# Patient Record
Sex: Male | Born: 1950 | Race: White | Hispanic: No | Marital: Married | Smoking: Never smoker
Health system: Southern US, Community
[De-identification: ages and names within clinical notes are randomized; demographics above are authoritative.]

## PROBLEM LIST (undated history)

## (undated) DIAGNOSIS — T8859XA Other complications of anesthesia, initial encounter: Secondary | ICD-10-CM

## (undated) DIAGNOSIS — F419 Anxiety disorder, unspecified: Secondary | ICD-10-CM

## (undated) DIAGNOSIS — M797 Fibromyalgia: Secondary | ICD-10-CM

## (undated) DIAGNOSIS — Z931 Gastrostomy status: Secondary | ICD-10-CM

## (undated) DIAGNOSIS — F32A Depression, unspecified: Secondary | ICD-10-CM

## (undated) DIAGNOSIS — J189 Pneumonia, unspecified organism: Secondary | ICD-10-CM

## (undated) DIAGNOSIS — T7840XA Allergy, unspecified, initial encounter: Secondary | ICD-10-CM

## (undated) DIAGNOSIS — G629 Polyneuropathy, unspecified: Secondary | ICD-10-CM

## (undated) DIAGNOSIS — M1990 Unspecified osteoarthritis, unspecified site: Secondary | ICD-10-CM

## (undated) DIAGNOSIS — C099 Malignant neoplasm of tonsil, unspecified: Secondary | ICD-10-CM

## (undated) DIAGNOSIS — K5900 Constipation, unspecified: Secondary | ICD-10-CM

## (undated) DIAGNOSIS — C3490 Malignant neoplasm of unspecified part of unspecified bronchus or lung: Secondary | ICD-10-CM

## (undated) DIAGNOSIS — Z923 Personal history of irradiation: Secondary | ICD-10-CM

## (undated) DIAGNOSIS — T4145XA Adverse effect of unspecified anesthetic, initial encounter: Secondary | ICD-10-CM

## (undated) DIAGNOSIS — F329 Major depressive disorder, single episode, unspecified: Secondary | ICD-10-CM

## (undated) DIAGNOSIS — E119 Type 2 diabetes mellitus without complications: Secondary | ICD-10-CM

## (undated) DIAGNOSIS — R079 Chest pain, unspecified: Secondary | ICD-10-CM

## (undated) DIAGNOSIS — N35919 Unspecified urethral stricture, male, unspecified site: Secondary | ICD-10-CM

## (undated) HISTORY — PX: CYSTOSCOPY: SUR368

---

## 1998-01-13 HISTORY — PX: TOTAL HIP ARTHROPLASTY: SHX124

## 1998-01-23 ENCOUNTER — Emergency Department (HOSPITAL_COMMUNITY): Admission: EM | Admit: 1998-01-23 | Discharge: 1998-01-23 | Payer: Self-pay | Admitting: Emergency Medicine

## 1998-09-18 ENCOUNTER — Encounter: Payer: Self-pay | Admitting: Orthopedic Surgery

## 1998-09-24 ENCOUNTER — Encounter: Payer: Self-pay | Admitting: Orthopedic Surgery

## 1998-09-24 ENCOUNTER — Inpatient Hospital Stay (HOSPITAL_COMMUNITY): Admission: RE | Admit: 1998-09-24 | Discharge: 1998-09-28 | Payer: Self-pay | Admitting: Orthopedic Surgery

## 1998-12-04 ENCOUNTER — Inpatient Hospital Stay (HOSPITAL_COMMUNITY): Admission: EM | Admit: 1998-12-04 | Discharge: 1998-12-05 | Payer: Self-pay | Admitting: Cardiology

## 1999-01-16 ENCOUNTER — Inpatient Hospital Stay (HOSPITAL_COMMUNITY): Admission: RE | Admit: 1999-01-16 | Discharge: 1999-01-18 | Payer: Self-pay | Admitting: Orthopedic Surgery

## 2005-04-22 ENCOUNTER — Other Ambulatory Visit: Admission: RE | Admit: 2005-04-22 | Discharge: 2005-04-22 | Payer: Self-pay | Admitting: Diagnostic Radiology

## 2005-04-22 ENCOUNTER — Encounter: Admission: RE | Admit: 2005-04-22 | Discharge: 2005-04-22 | Payer: Self-pay | Admitting: Endocrinology

## 2005-04-22 ENCOUNTER — Encounter (INDEPENDENT_AMBULATORY_CARE_PROVIDER_SITE_OTHER): Payer: Self-pay | Admitting: Specialist

## 2006-12-22 ENCOUNTER — Ambulatory Visit (HOSPITAL_COMMUNITY): Admission: RE | Admit: 2006-12-22 | Discharge: 2006-12-22 | Payer: Self-pay | Admitting: Family Medicine

## 2006-12-31 ENCOUNTER — Encounter (INDEPENDENT_AMBULATORY_CARE_PROVIDER_SITE_OTHER): Payer: Self-pay | Admitting: Urology

## 2006-12-31 ENCOUNTER — Ambulatory Visit (HOSPITAL_COMMUNITY): Admission: RE | Admit: 2006-12-31 | Discharge: 2006-12-31 | Payer: Self-pay | Admitting: Urology

## 2009-01-30 ENCOUNTER — Emergency Department (HOSPITAL_COMMUNITY): Admission: EM | Admit: 2009-01-30 | Discharge: 2009-01-30 | Payer: Self-pay | Admitting: Emergency Medicine

## 2010-01-13 DIAGNOSIS — J189 Pneumonia, unspecified organism: Secondary | ICD-10-CM

## 2010-01-13 HISTORY — DX: Pneumonia, unspecified organism: J18.9

## 2010-04-29 ENCOUNTER — Emergency Department (HOSPITAL_COMMUNITY): Payer: 59

## 2010-04-29 ENCOUNTER — Inpatient Hospital Stay (HOSPITAL_COMMUNITY)
Admission: EM | Admit: 2010-04-29 | Discharge: 2010-05-04 | DRG: 871 | Disposition: A | Discharge status: Home Health | Payer: 59 | Attending: Otolaryngology | Admitting: Otolaryngology

## 2010-04-29 ENCOUNTER — Inpatient Hospital Stay (HOSPITAL_COMMUNITY): Payer: 59

## 2010-04-29 DIAGNOSIS — J189 Pneumonia, unspecified organism: Secondary | ICD-10-CM | POA: Diagnosis present

## 2010-04-29 DIAGNOSIS — A419 Sepsis, unspecified organism: Secondary | ICD-10-CM | POA: Diagnosis present

## 2010-04-29 DIAGNOSIS — F05 Delirium due to known physiological condition: Secondary | ICD-10-CM | POA: Diagnosis present

## 2010-04-29 DIAGNOSIS — E1142 Type 2 diabetes mellitus with diabetic polyneuropathy: Secondary | ICD-10-CM | POA: Diagnosis present

## 2010-04-29 DIAGNOSIS — A4102 Sepsis due to Methicillin resistant Staphylococcus aureus: Principal | ICD-10-CM | POA: Diagnosis present

## 2010-04-29 DIAGNOSIS — G8929 Other chronic pain: Secondary | ICD-10-CM | POA: Diagnosis present

## 2010-04-29 DIAGNOSIS — R911 Solitary pulmonary nodule: Secondary | ICD-10-CM | POA: Diagnosis present

## 2010-04-29 DIAGNOSIS — N200 Calculus of kidney: Secondary | ICD-10-CM | POA: Diagnosis present

## 2010-04-29 DIAGNOSIS — E1149 Type 2 diabetes mellitus with other diabetic neurological complication: Secondary | ICD-10-CM | POA: Diagnosis present

## 2010-04-29 DIAGNOSIS — IMO0001 Reserved for inherently not codable concepts without codable children: Secondary | ICD-10-CM | POA: Diagnosis present

## 2010-04-29 LAB — CK TOTAL AND CKMB (NOT AT ARMC)
Relative Index: INVALID (ref 0.0–2.5)
Total CK: 63 U/L (ref 7–232)

## 2010-04-29 LAB — CARDIAC PANEL(CRET KIN+CKTOT+MB+TROPI)
CK, MB: 2.2 ng/mL (ref 0.3–4.0)
Relative Index: INVALID (ref 0.0–2.5)
Total CK: 65 U/L (ref 7–232)
Troponin I: 0.03 ng/mL (ref 0.00–0.06)

## 2010-04-29 LAB — GLUCOSE, CAPILLARY
Glucose-Capillary: 217 mg/dL — ABNORMAL HIGH (ref 70–99)
Glucose-Capillary: 215 mg/dL — ABNORMAL HIGH (ref 70–99)

## 2010-04-29 LAB — BASIC METABOLIC PANEL
BUN: 15 mg/dL (ref 6–23)
Chloride: 99 mEq/L (ref 96–112)
Creatinine, Ser: 0.81 mg/dL (ref 0.4–1.5)
GFR calc non Af Amer: >60 mL/min (ref >60)
Glucose, Bld: 241 mg/dL — ABNORMAL HIGH (ref 70–99)
Sodium: 132 mEq/L — ABNORMAL LOW (ref 135–145)

## 2010-04-29 LAB — DIFFERENTIAL
Basophils Absolute: 0 10*3/uL (ref 0.0–0.1)
Basophils Relative: 0 % (ref 0–1)
Eosinophils Absolute: 0.1 10*3/uL (ref 0.0–0.7)
Eosinophils Relative: 1 % (ref 0–5)
Lymphocytes Relative: 12 % (ref 12–46)
Lymphs Abs: 1.7 10*3/uL (ref 0.7–4.0)
Monocytes Relative: 10 % (ref 3–12)
Neutro Abs: 10.9 10*3/uL — ABNORMAL HIGH (ref 1.7–7.7)
Neutrophils Relative %: 77 % (ref 43–77)

## 2010-04-29 LAB — URINALYSIS, ROUTINE W REFLEX MICROSCOPIC

## 2010-04-29 LAB — CBC
HCT: 41.8 % (ref 39.0–52.0)
MCH: 30.7 pg (ref 26.0–34.0)
MCV: 92.3 fL (ref 78.0–100.0)
WBC: 14.1 10*3/uL — ABNORMAL HIGH (ref 4.0–10.5)

## 2010-04-29 LAB — LIPID PANEL
Cholesterol: 121 mg/dL (ref 0–200)
HDL: 13 mg/dL — ABNORMAL LOW (ref >39)
LDL Cholesterol: 86 mg/dL (ref 0–99)
Total CHOL/HDL Ratio: 9.3 RATIO
Triglycerides: 110 mg/dL (ref <150)

## 2010-04-29 LAB — HEMOGLOBIN A1C: Hgb A1c MFr Bld: 8.1 % — ABNORMAL HIGH (ref <5.7)

## 2010-04-29 LAB — MRSA PCR SCREENING: MRSA by PCR: POSITIVE — AB

## 2010-04-30 ENCOUNTER — Inpatient Hospital Stay (HOSPITAL_COMMUNITY): Payer: 59

## 2010-04-30 LAB — GLUCOSE, CAPILLARY
Glucose-Capillary: 155 mg/dL — ABNORMAL HIGH (ref 70–99)
Glucose-Capillary: 110 mg/dL — ABNORMAL HIGH (ref 70–99)

## 2010-04-30 LAB — COMPREHENSIVE METABOLIC PANEL
ALT: 30 U/L (ref 0–53)
Alkaline Phosphatase: 75 U/L (ref 39–117)
BUN: 9 mg/dL (ref 6–23)
CO2: 26 mEq/L (ref 19–32)
Chloride: 101 mEq/L (ref 96–112)
GFR calc non Af Amer: >60 mL/min (ref >60)
Glucose, Bld: 147 mg/dL — ABNORMAL HIGH (ref 70–99)
Potassium: 3.8 mEq/L (ref 3.5–5.1)
Sodium: 136 mEq/L (ref 135–145)
Total Bilirubin: 0.9 mg/dL (ref 0.3–1.2)
Total Protein: 7.1 g/dL (ref 6.0–8.3)

## 2010-04-30 LAB — CBC
HCT: 39.2 % (ref 39.0–52.0)
Hemoglobin: 12.8 g/dL — ABNORMAL LOW (ref 13.0–17.0)
MCH: 30.6 pg (ref 26.0–34.0)
MCV: 93.8 fL (ref 78.0–100.0)
Platelets: 195 10*3/uL (ref 150–400)
RBC: 4.18 MIL/uL — ABNORMAL LOW (ref 4.22–5.81)
WBC: 11.9 10*3/uL — ABNORMAL HIGH (ref 4.0–10.5)

## 2010-04-30 LAB — T4, FREE: Free T4: 1.05 ng/dL (ref 0.80–1.80)

## 2010-04-30 LAB — CARDIAC PANEL(CRET KIN+CKTOT+MB+TROPI)
CK, MB: 1.4 ng/mL (ref 0.3–4.0)
Troponin I: 0.02 ng/mL (ref 0.00–0.06)

## 2010-04-30 LAB — DIFFERENTIAL
Basophils Relative: 0 % (ref 0–1)
Eosinophils Absolute: 0.1 10*3/uL (ref 0.0–0.7)
Lymphs Abs: 1.4 10*3/uL (ref 0.7–4.0)
Monocytes Absolute: 1.1 10*3/uL — ABNORMAL HIGH (ref 0.1–1.0)
Neutrophils Relative %: 77 % (ref 43–77)

## 2010-05-01 ENCOUNTER — Inpatient Hospital Stay (HOSPITAL_COMMUNITY): Payer: 59

## 2010-05-01 LAB — BASIC METABOLIC PANEL
BUN: 11 mg/dL (ref 6–23)
Calcium: 8.2 mg/dL — ABNORMAL LOW (ref 8.4–10.5)
Chloride: 101 mEq/L (ref 96–112)
GFR calc Af Amer: >60 mL/min (ref >60)
GFR calc non Af Amer: >60 mL/min (ref >60)
Glucose, Bld: 133 mg/dL — ABNORMAL HIGH (ref 70–99)
Potassium: 3.9 mEq/L (ref 3.5–5.1)
Sodium: 134 mEq/L — ABNORMAL LOW (ref 135–145)

## 2010-05-01 LAB — BLOOD GAS, ARTERIAL
O2 Content: 3.5 L/min
O2 Saturation: 94.2 %
Patient temperature: 37
pH, Arterial: 7.436 (ref 7.350–7.450)
pO2, Arterial: 65.5 mmHg — ABNORMAL LOW (ref 80.0–100.0)

## 2010-05-01 LAB — INFLUENZA PANEL BY PCR (TYPE A & B): H1N1 flu by pcr: NOT DETECTED

## 2010-05-01 LAB — URINE CULTURE
Colony Count: NO GROWTH
Culture: NO GROWTH

## 2010-05-01 LAB — GLUCOSE, CAPILLARY
Glucose-Capillary: 116 mg/dL — ABNORMAL HIGH (ref 70–99)
Glucose-Capillary: 116 mg/dL — ABNORMAL HIGH (ref 70–99)

## 2010-05-02 LAB — GLUCOSE, CAPILLARY
Glucose-Capillary: 129 mg/dL — ABNORMAL HIGH (ref 70–99)
Glucose-Capillary: 119 mg/dL — ABNORMAL HIGH (ref 70–99)
Glucose-Capillary: 85 mg/dL (ref 70–99)

## 2010-05-02 LAB — BASIC METABOLIC PANEL
CO2: 27 mEq/L (ref 19–32)
Calcium: 8.1 mg/dL — ABNORMAL LOW (ref 8.4–10.5)
Creatinine, Ser: 0.64 mg/dL (ref 0.4–1.5)
GFR calc Af Amer: >60 mL/min (ref >60)
GFR calc non Af Amer: >60 mL/min (ref >60)

## 2010-05-02 LAB — CULTURE, BLOOD (ROUTINE X 2)

## 2010-05-02 LAB — VANCOMYCIN, TROUGH: Vancomycin Tr: 13 ug/mL (ref 10.0–20.0)

## 2010-05-03 LAB — GLUCOSE, CAPILLARY
Glucose-Capillary: 104 mg/dL — ABNORMAL HIGH (ref 70–99)
Glucose-Capillary: 148 mg/dL — ABNORMAL HIGH (ref 70–99)
Glucose-Capillary: 99 mg/dL (ref 70–99)
Glucose-Capillary: 103 mg/dL — ABNORMAL HIGH (ref 70–99)

## 2010-05-03 LAB — BASIC METABOLIC PANEL
Calcium: 8.5 mg/dL (ref 8.4–10.5)
GFR calc Af Amer: >60 mL/min (ref >60)
GFR calc non Af Amer: >60 mL/min (ref >60)
Glucose, Bld: 92 mg/dL (ref 70–99)
Potassium: 3.6 mEq/L (ref 3.5–5.1)
Sodium: 138 mEq/L (ref 135–145)

## 2010-05-08 NOTE — Discharge Summary (Signed)
NAME:  [name], [name] NO.:  [id number]  MEDICAL RECORD NO.:  [id number]           PATIENT TYPE:  I  LOCATION:  A326                          FACILITY:  [facility]  PHYSICIAN:  Aleksei V. Plotnikov, MDDATE OF BIRTH:  June 28, 1950  DATE OF ADMISSION:  04/29/2010 DATE OF DISCHARGE:  04/21/2012LH                              DISCHARGE SUMMARY   FOLLOWUP PLANS: 1. With Dr. Loreta Ave next week with CMET and CBC. 2. Dr. Juanetta Gosling, Pulmonary, in 4-6 weeks.  SPECIAL INSTRUCTIONS:  Check vancomycin level by [facility] home [facility] weekly.  Recheck chest x-ray PA and lateral in 4-6 weeks.  ACTIVITY:  Increase activities slowly as tolerated.  DIET:  Resume previous.  DISCHARGE DIAGNOSES: 1. Pneumonia with methicillin-resistant Staphylococcus aureus     bacteremia. 2. Type 2 diabetes. 3. Confusion, resolved. 4. Pulmonary nodule in the left lung status post Pulmonary     consultation with Dr. Juanetta Gosling, recommended a repeat chest x-ray and     biopsy if not resolved. 5. Probable aspiration episode. 6. Hypertension. 7. Possible heavy alcohol use. 8. Bronchospasm, resolved. 9. Back pain, fibromyalgia.  DISCHARGE MEDICATIONS: 1. Tylenol 325 mg two q.6 h. p.r.n. 2. Aspirin 81 mg daily. 3. Tussionex syrup 5 mL b.i.d. p.r.n. 4. Lisinopril 5 mg daily. 5. Metformin 500 mg daily. 6. Avelox 400 mg daily for 11 days. 7. Bactroban ointment nasally twice a day for 11 days. 8. Nystatin suspension 100,000 units/mL, 5 mL by mouth three times a     day for 11 days. 9. Thiamine 100 mg daily. 10.Vancomycin 2 g IV q.12 h. for 11 days. 11.Alprazolam 2 mg three times a day p.r.n. as prior. 12.Cymbalta 60 mg daily, resume previous. 13.Lortab 10/325 one to two tablets every 6 hours p.r.n., resume     previous. 14.Lyrica 75 mg one capsule three times a day p.r.n., resume previous.  OUTPATIENT ARRANGEMENTS:  IV antibiotics will be administered by advanced home care.  HISTORY:  The patient is a  60 year old male who was admitted with fever, tachycardia, low blood pressure, severe back pain, and cough.  He had been feeling bad for about 2-3 weeks prior to his admission.  He was having cough with left-sided chest pain.  He was admitted, started on IV antibiotics, and breathing treatments.  His white cell count was 14.1, hemoglobin 13.9.  CT of the abdomen and pelvis showed multifocal airspace disease, nodular density in the lingula, small stones in the kidney and originally, he was started on Rocephin and Zithromax which later were switched to vancomycin and Avelox.  His condition has gradually improved.  His confusion that was attributed to his illness and prior alcohol use has resolved.  On the day of discharge, he is feeling well.  His temperature is 98.2, T-max 98.3, heart rate 103, respirations 16, blood pressure 131/82, sats 91% on room air, blood sugars 103, 148, 104.  He is in no acute distress.  He is overweight. HEENT with moist mucosa.  Neck supple.  Lungs with decreased breath sounds at bases and he has slight wheezes at the right base.  Heart, S1  and S2 slight tachycardia.  Abdomen soft, nontender, obese.  Lower extremities without edema.  Calves nontender.  He is alert, oriented, and cooperative. Cranial nerves are nonfocal.    His chest x-ray on April18, 2012, revealed bilateral pleural effusions  and basilar atelectasis infiltration without significant change.  Again, his CAT scan of the abdomen on April 29, 2010, revealed a nodular density in the lingula, likely also reflecting infection.  Recommended followup CAT scan after resolution of the symptoms to assure resolution of this lesion as well. However, Dr. Juanetta Gosling recommended a repeat chest x-ray.  His bladder was moderately distended.  He had spondylosis of the lower lumbar spine, diffuse fatty infiltration of the liver, minimal stranding of the upper pole of the left kidney which was nonspecific.  His original  chest x-ray revealed low lung volumes with bibasilar atelectasis and small pleural effusions, nonspecific 8 mm diameter left midlung nodular density.  On May 03, 2010, his sodium was 138, potassium 3.6, glucose 92, BUN 7, creatinine 0.78.  His urine culture was negative.  His blood culture revealed MRSA and nasal swab PCR revealed MRSA.     Georgina Quint. Plotnikov, MD     AVP/MEDQ  D:  05/04/2010  T:  05/05/2010  Job:  [id number]  cc:   Sharlet Salina L. Loreta Ave, [place]  Electronically Signed by Jacinta Shoe MD on 05/08/2010 12:12:29 AM

## 2010-05-12 NOTE — Group Therapy Note (Signed)
  NAME:  [name], [name]                    ACCOUNT NO.:  [id number]  MEDICAL RECORD NO.:  [id number]           PATIENT TYPE:  I  LOCATION:  IC02                          FACILITY:  [facility]  PHYSICIAN:  Shahida Schnackenberg L. Juanetta Gosling, M.D.DATE OF BIRTH:  1950/02/22  DATE OF PROCEDURE: DATE OF DISCHARGE:                                PROGRESS NOTE   [name] continues to look better.  He is complaining now that he has had significant cough and congestion and has been unable to get anything up.  Everything else is about the same.  He says he has not slept, but he does feel a little bit better in general.  PHYSICAL EXAMINATION:  VITAL SIGNS:  His blood pressure 125/74, pulse much better now down in the 80s, he is afebrile.     Regan Mcbryar L. Juanetta Gosling, M.D.     ELH/MEDQ  D:  05/02/2010  T:  05/02/2010  Job:  [id number]  Electronically Signed by Kari Baars M.D. on 05/12/2010 02:38:28 PM

## 2010-05-12 NOTE — Group Therapy Note (Signed)
  NAME:  KALEAB, FRASIER NO.:  [id number]  MEDICAL RECORD NO.:  [id number]           PATIENT TYPE:  LOCATION:                                 FACILITY:  PHYSICIAN:  Kealy Lewter L. Juanetta Gosling, M.D.DATE OF BIRTH:  11/16/1950  DATE OF PROCEDURE: DATE OF DISCHARGE:                                PROGRESS NOTE   The patient of the [facility] hospital[facility] team 1.  Mr. Mazzuca had trouble yesterday with being confused.  He appeared to have aspirated on some egg yesterday morning.  This morning, he is less confused.  He is more alert.  His blood pressure earlier was 125/80, last blood pressure was 87/63, but he looks very comfortable, much more alert than before.  His heart rate is about 112.  He is afebrile now, although he had a temperature as high as 37.5 or 99.5 yesterday morning.  His I and O is +850 yesterday -50 so far today.  His blood gas shows a pH of 7.43, pCO2 of 39, pO2 of 65.  His electrolytes are normal.  He did have positive blood cultures for what appears to be staph at least it is gram-positive cocci in clusters.  He is on vancomycin now.  I think he has improved some, although he is still sick.  I think what we are dealing with then is probably a staphylococcal pneumonia.  He is being treated for that, and I will plan to continue meds and treatments and follow.     Risa Auman L. Juanetta Gosling, M.D.     ELH/MEDQ  D:  05/01/2010  T:  05/01/2010  Job:  [id number]  Electronically Signed by Kari Baars M.D. on 05/12/2010 02:38:25 PM

## 2010-05-12 NOTE — Group Therapy Note (Signed)
  NAME:  [name], [name]                    ACCOUNT NO.:  [id number]  MEDICAL RECORD NO.:  [id number]           PATIENT TYPE:  I  LOCATION:  A326                          FACILITY:  [facility]  PHYSICIAN:  Myonna Chisom L. Juanetta Gosling, M.D.DATE OF BIRTH:  18-May-1950  DATE OF PROCEDURE: DATE OF DISCHARGE:                                PROGRESS NOTE   The patient of the [facility], and he was admitted with pneumonia and a positive blood culture for staph.  He seems to be doing better in general.  PHYSICAL EXAMINATION:  VITAL SIGNS:  His exam shows that his temperature is 98.3, pulse 100, respirations 18, blood pressure 148/93, O2 sat 96% on 3-1/2 liters. CHEST:  Shows rhonchi bilaterally.  ASSESSMENT:  He has pneumonia.  This appears to be methicillin-resistant Staphylococcus aureus pneumonia with positive blood culture.  PLAN:  My plan then is to continue with his treatments.  He is on vancomycin, seems to be improving on a daily basis.  His chest is much clearer than it has been.  He looks much better in general.  I do not think there is anything to change at this point.     Vyla Pint L. Juanetta Gosling, M.D.     ELH/MEDQ  D:  05/03/2010  T:  05/03/2010  Job:  [id number]  Electronically Signed by Kari Baars M.D. on 05/12/2010 02:38:31 PM

## 2010-05-12 NOTE — Consult Note (Signed)
NAME:  [name], [name]                    ACCOUNT NO.:  [id number]  MEDICAL RECORD NO.:  [id number]           PATIENT TYPE:  I  LOCATION:  IC02                          FACILITY:  [facility]  PHYSICIAN:  Adelle Zachar L. Juanetta Gosling, M.D.DATE OF BIRTH:  January 28, 1950  DATE OF CONSULTATION: DATE OF DISCHARGE:                                CONSULTATION   REASON FOR CONSULTATION:  Abnormal chest x-ray.  HISTORY:  [name] is a 60 year old who has a history of multiple medical problems including diabetes and neuropathy.  He started having some back pain and nausea about 2-1/2 weeks ago when after he had been started on Cymbalta and Lyrica for diabetic neuropathy.  He continued having some discomfort in his abdomen.  Then he started having some cough and congestion but has not had a definite fever or chills.  He says he still feels bad, still having some abdominal discomfort, still coughing, still short of breath.  PAST MEDICAL HISTORY:  Positive for diabetes, neuropathy in the right hip.  He has had a hip replacement.  He has a urethral stricture, fibromyalgia, and diabetic neuropathy.  SOCIAL HISTORY:  He works in a [employer].  He does not smoke.  He stopped smoking about 20 years ago.  Uses alcohol on rare occasions.  He does not use any illicit drugs.  FAMILY HISTORY:  His mother died of leukemia with liver disease, unknown about his father.  There is a history of hypertension.  PHYSICAL EXAMINATION:  GENERAL:  He looks uncomfortable, still has a heart rate of about 120. HEENT:  His pupils are reactive.  Nose and throat are clear.  Mucous membranes are moist. NECK:  Supple. CHEST:  Some rhonchi bilaterally. ABDOMEN:  Soft with some minimal diffuse tenderness.  Bowel sounds present and active. EXTREMITIES:  No edema.  LAB WORK ON ADMISSION:  White count 14,100, hemoglobin 13.9, platelets 154,000.  BMET shows a BUN of 15, creatinine 0.8.  He has had blood cultures done that are negative at  less than 24 hours.  HIV nonreactive. Cardiac panel thus far not suggestive of any sort of myocardial infarction.  CBC this morning shows white count is down to 11,900.  BNP is less than 30, metabolic profile essentially normal.  Albumin is 2.7. He had a CT abdomen and pelvis and a chest x-ray which show a nodular density in the left lingular area that is probably of infection also what appears to be pneumonia.  ASSESSMENT:  I would go ahead and treat him as pneumonia.  We will need to follow his chest x-ray.  If the pulmonary nodule does not resolve, he is going to need to have some sort of a biopsy.  This area is against the chest wall, so probably a needle biopsy would be the best way to go about this and I will plan to go ahead and have him to follow with you. I agree with current treatments.  I would treat this as you are as a community-acquired pneumonia.     [name] L. Juanetta Gosling, M.D.     ELH/MEDQ  D:  04/30/2010  T:  04/30/2010  Job:  [id number]  Electronically Signed by Kari Baars M.D. on 05/12/2010 02:38:21 PM

## 2010-05-12 NOTE — Group Therapy Note (Signed)
  NAME:  WITT, PLITT                    ACCOUNT NO.:  [id number]  MEDICAL RECORD NO.:  [id number]           PATIENT TYPE:  I  LOCATION:  A326                          FACILITY:  [facility]  PHYSICIAN:  Nyshawn Gowdy L. Juanetta Gosling, M.D.DATE OF BIRTH:  10-27-1950  DATE OF PROCEDURE: DATE OF DISCHARGE:  05/04/2010                                PROGRESS NOTE   The patient of the [facility].  Mr. Hulbert is much improved. He says he feels well and has no complaints.  His exam shows his temperature is 98.2, pulse 103, respirations 16, blood pressure 131/82, O2 sats 91% on room air, 93% on 2 liters.  His chest is much clearer. His heart is regular without gallop.  His abdomen is soft.  ASSESSMENT:  He is much improved.  PLAN:  I am going to follow a bit more peripherally now.  He has pneumonia and has had a MRSA in the blood.  I think it is an MRSA pneumonia.  This will need to be followed until it is clear.  Thanks for allowing me to see him with you.     Dann Galicia L. Juanetta Gosling, M.D.     ELH/MEDQ  D:  05/04/2010  T:  05/04/2010  Job:  [id number]  Electronically Signed by Kari Baars M.D. on 05/12/2010 02:38:37 PM

## 2010-05-20 NOTE — Group Therapy Note (Signed)
NAME:  [name], [name]                    ACCOUNT NO.:  [id number]  MEDICAL RECORD NO.:  [id number]           PATIENT TYPE:  I  LOCATION:  A326                          FACILITY:  [facility]  PHYSICIAN:  Mirca Yale L. Lendell Caprice, MDDATE OF BIRTH:  20-Jul-1950  DATE OF PROCEDURE:  05/03/2010 DATE OF DISCHARGE:                                PROGRESS NOTE   SUBJECTIVE:  The patient's cough is much improved after Tussionex and a flutter valve was started yesterday.  He is still feeling quite weak and has not ambulated yet.  He has no other new complaints.  OBJECTIVE:  VITAL SIGNS:  Temperature is 98.3, heart rate 70, respiratory rate 18, blood pressure 148/93, oxygen saturation 96-99% on 3-1/2 L  nasal cannula oxygen. GENERAL:  The patient is comfortable reading the newspaper with oxygen on.  He is alert and oriented. LUNGS:  Clear to auscultation bilaterally without wheezes, rhonchi or rales.  He is not coughing as much. CARDIOVASCULAR:  Regular rate and rhythm without murmurs, gallops or rubs. ABDOMEN:  Obese, soft, nontender. EXTREMITIES:  No clubbing, cyanosis or edema.  Blood glucoses have been ranging about 100 over the past 24 hours. Basic metabolic panel is normal.  ASSESSMENT AND PLAN: 1. Methicillin-resistant Staphylococcus aureus pneumonia with     bacteremia:  I have discussed the case with Dr. Daiva Eves of     Infectious Disease.  He recommends a total course of IV vancomycin     for a month and then repeat blood cultures 2 weeks after completion     to ensure resolution of bacteremia.  Clinically, the patient is     much improved.  I will ask nurses to check his ambulatory pulse     oximetry and stop oxygen if his oxygenation is sufficient.  His     lungs are much more clear.  His cough is much improved.  I have     ordered a home health nurse to arrange IV vancomycin until Jun 01, 2010, and also to assist with PICC care.  The PICC line can come     out after that. 2. Resolved  sepsis. 3. Abnormal lingular nodular density seen on CT of the abdomen and     pelvis on admission:  This will need to be followed up with a     repeat CAT scan after resolution of his symptoms in 2-3 months to     ensure resolution.  This could simply be infectious etiology.  Dr.     Juanetta Gosling was consulted and agrees.  No further workup needed at this     time. 4. Previously diet-controlled diabetes, not adequately controlled     based on hospitalization, blood glucoses and hemoglobin A1c of     above 8:  He is doing well on metformin 500 mg a day and Amaryl 1     mg a day. 5. Chronic pain and neuropathy, stable. 6. Back pain on admission, resolved quickly, possibly a past kidney     stone. 7. Resolved acute delirium secondary to above. 8. Chronic opiate  and benzodiazepine use. 9. Bronchospasm, resolved. 10.Status post isolated aspiration event:  No problems per speech     therapy's evaluation.  He has been fine on a regular diet.  We will increase his activity level. 1. Elevated blood pressure:  He does not have a diagnosis of     hypertension that I can see.  Due to his diabetes and persistently     elevated blood pressures over the past several days, I will start     him on lisinopril 5 mg daily as he most likely has hypertension.     Madex Seals L. Lendell Caprice, MD     CLS/MEDQ  D:  05/03/2010  T:  05/03/2010  Job:  [id number]  cc:   Kirk Ruths, M.D. Fax: [phone]  Electronically Signed by Crista Curb MD on 05/20/2010 08:06:48 AM

## 2010-05-28 NOTE — Op Note (Signed)
NAME:  [name], [name] NO.:  [id number]   MEDICAL RECORD NO.:  [id number]          PATIENT TYPE:  AMB   LOCATION:  DAY                          FACILITY:  [facility]   PHYSICIAN:  Mark C. Vernie Ammons, M.D.  DATE OF BIRTH:  06-Aug-1950   DATE OF PROCEDURE:  DATE OF DISCHARGE:                               OPERATIVE REPORT   PREOPERATIVE DIAGNOSIS:  Urethral stricture.   POSTOPERATIVE DIAGNOSES:  1. Urethral stricture.  2. Bladder lesion.   PROCEDURE:  1. Cystoscopy.  2. Laser incision of urethral stricture.  3. Balloon dilation of urethral stricture.  4. Bladder biopsy.  5. Fulguration of bleeders.   SURGEON:  Mark C. Vernie Ammons, M.D.   Threasa HeadsAllena Katz   ANESTHESIA:  General.   SPECIMEN:  Cold cup biopsy of bladder wall to pathology.   DRAINS:  18-French Foley catheter.   ESTIMATED BLOOD LOSS:  Minimal.   COMPLICATIONS:  None.   INDICATIONS:  The patient is a 60 year old white male who has had a  history of what sounds like a stricture 20 years ago.  He went into  retention and went to emergency room.  They attempted to place the  catheter, were unsuccessful and had to have a suprapubic tube placed.  The patient describes what sounds like a urethral dilation and eventual  placement of urethral catheter.  He was found recently by CT scan, after  complaining of abdominal discomfort to have a markedly distended bladder  but no hydronephrosis and a normal creatinine of 0.7.  He was found  cystoscopically to have a deep bulbar urethral stricture and is brought  to the operating room for treatment of that.  The risks, complications,  and alternatives were discussed.  The patient understands, elected  to  proceed.   DESCRIPTION OF OPERATION:  After informed consent, the patient was  brought to the major OR and placed on the table, administered general  anesthesia and moved to the dorsal lithotomy position.  He received  intravenous antibiotics and an official  time out was then performed.  Initially, a 22-French cystoscope with globular lens was inserted  through the urethra and passed under direct visualization down the  urethra.  There was a mild stricture that did not impede  Passage of the 17-French scope in my office but caused some hold up with  the 22-French scope.  This was located in the proximal bulbar urethral  region.  The scope was gently passed through this using gentle pressure  and on down in the deep bulbar urethra.  A dense stricture was  identified.  This was photographed and a 0.03 floppy-tip guide wire was  then passed through the cystoscope and into the bladder with the use of  fluoroscopy to confirm the presence of the guide wire in the bladder.   Initially, the 1 mm holmium laser fiber was then passed through the  cystoscope, and an incision was made at the 12 o'clock position through  the stricture.  In doing this, it appeared that the prostatic urethra  was very close by.  I therefore stopped any further incising with the  laser at that time.   I was able to gently pass the scope through this area and found the  prostatic urethra nearby.  There was mild bilobar hypertrophy but no  prostatic urethral lesions were identified.  This appeared to indicate  the strictures location was at the level of the membranous urethra, and  therefore, further incision was not prudent.  The bladder was found to  have a significant amount of residual.  This was drained and then  refilled slowly, and with direct visualization, the entire wall of the  bladder was fully inspected.  There was some hypertrophy of the  detrusors seen beneath the mucosa.  On the left wall posteriorly, there  did appear to be an area that seemed more erythematous then the  surrounding mucosa.  The bladder was then filled to capacity under  anesthesia and then drained.  Once it was found to hold 1,000 cc.   Nephrostomy dilating balloon was then passed over the  guide wire after  the cystoscope was removed.  Its position was confirmed  fluoroscopically, and it was inflated to dilate the urethral stricture.  This was then released and removed, and the cystoscope again passed per  urethra, and the stricture appeared well dilated at this time.   The cold cup biopsy forceps were then passed through the cystoscope, and  cold cup biopsies were obtained from the posterior wall of the bladder  in the area of the erythematous lesion.  This was then fulgurated with  the Bugbee electrode.  The bladder was then drained, an 18-French Foley  catheter was then placed in the bladder, and the patient was awakened  and taken to the recovery room in stable and satisfactory condition.  He  tolerated the procedure well.  There are no intraoperative  complications.   PLAN:  He will maintain the Foley catheter in place for one week and  follow up in my office for removal at that time and also to discuss the  pathology report.  He will be maintained on Cipro 500 mg for that  duration, be given a prescription for Vicodin.      Mark C. Vernie Ammons, M.D.  Electronically Signed     MCO/MEDQ  D:  12/31/2006  T:  12/31/2006  Job:  [id number]

## 2010-05-31 NOTE — Op Note (Signed)
[place]  Patient:    [name]                            MRN: [id number] Proc. Date: 01/16/99 Adm. 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  PROCEDURE:  Exploration and decompression of sciatic nerve, posterior right hip, with neurolysis and epineurotomy.  SURGEON:  Loreta Ave, M.D.  ASSISTANT:  Arlys John D. Petrarca, P.A.-C.  ANESTHESIA:  General.  BLOOD LOSS:  About 200 cc.  BLOOD GIVEN:  None.  SPECIMENS:  None.  CULTURES:  None.  COMPLICATIONS:  None.  DRESSING:  Soft compressive.  DESCRIPTION OF PROCEDURE:  Patient was brought to the operating room and after adequate anesthesia had been obtained, turned to a lateral position and prepped and draped in the usual sterile fashion.  Appropriate padding and support. Previous total hip incision was utilized and opened over its length; this was a posterior approach.  Skin and subcutaneous tissue and iliotibial band divided and Charnley retractor put in place.  Careful dissection was made over the posterior aspect f the hip, leaving the capsule and external rotators in intact, not entering the ip joint itself.  Sciatic nerve was identified and completely decompressed over its entire course, from the sciatic notch proximally down past the gluteal attachment to the femur distally.  The site of compression on EMG and nerve conduction studies was right over the external rotators.  There were numerous fibrous bands in this area.  I completely released the piriformis over the sciatic nerve proximally and followed the nerve up to  the level of the gluteal branches where there was no compression by EMG and nerve conduction studies from that level above.  It was hen completely decompressed distally all the way down, well past the initial operative field, going distal to the gluteal attachment to the femur.  Very thickened epineurium was opened over the entire course.  There was no evidence of vascular lesion or stretched lesion within the nerve itself and it really appeared quite  normal in appearance, once completely decompressed.  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  There was no undue tension through full motion.  With the hip at a position at rest, there was no tension on the nerve at all.  I could only get some tension if the hip was flexed forward and the knee fully extended, but again, this was certainly not unacceptable.  The nerve was left completely decompressed; the wound thoroughly  irrigated.  Iliotibial band was closed with #1 Vicryl; skin and subcutaneous tissue with Vicryl and staples.  Margins of the wound were injected with Marcaine, sterile compressive dressing applied, returned to the supine position, anesthesia reversed and brought to the recovery room; tolerated surgery well and no complications. DD:  01/17/99 TD:  01/17/99 Job: [id number]

## 2010-06-03 NOTE — H&P (Signed)
NAME:  [name], [name] NO.:  [id number]  MEDICAL RECORD NO.:  [id number]           PATIENT TYPE:  E  LOCATION:  [facility]                          FACILITY:  [facility]  PHYSICIAN:  Elliot Cousin, M.D.    DATE OF BIRTH:  04-01-1950  DATE OF ADMISSION:  04/29/2010 DATE OF DISCHARGE:                             HISTORY & PHYSICAL   PRIMARY CARE PHYSICIAN:  Dr. Regino Schultze; his PA is Mr. Dwyane Luo.  CHIEF COMPLAINT:  Severe back pain with cough.  HISTORY OF PRESENT ILLNESS:  This is a 60 year old gentleman with a history of diet-controlled diabetes and compressive neuropathy of the right hip who presents to the ED with fever, tachycardia, hypotension and severe back pain with cough.  The history is primarily given by his wife as the patient is feeling poorly.  The wife tells me that he began feeling poorly with back pain and nausea approximately two-and-one-half weeks ago when he started Cymbalta and Lyrica.  One week ago he developed a cough and fever but attributed it to allergies.  Then, on Saturday night he developed severe left back pain with cough and had difficulty sleeping.  However, on Sunday night he had no sleep and was screaming in pain, coughing while he was holding the left side of his back.  He was brought to the ED doubled over in pain.  Currently, he is feeling better.  The wife tells me that she stopped his Cymbalta a few days ago as she believed it was contributing to his feeling poorly.  His Lyrica was continued and increased.  The patient denies any chest pain, changes in his bowel habits or hemoptysis.  PAST MEDICAL HISTORY:  Significant for: 1. Diet-controlled diabetes. 2. Compressive neuropathy of the right hip; he is status post right     hip replacement. 3. History of urethral stricture; he is status post dilation. 4. History of fibromyalgia.  HOME MEDICATIONS:  Have yet to be verified by Pharmacy but include: 1. Xanax 2 mg, one tablet t.i.d. 2.  Lyrica 25 mg, one tablet t.i.d. 3. Hydrocodone 10/325, one to two tablets q.6h. p.r.n. pain. 4. Zolpidem 10 mg at bedtime. 5. As mentioned his Cymbalta has been stopped.  ALLERGIES:  He has a severe allergy to Neurontin in that it causes seizures and anaphylaxis.  REVIEW OF SYSTEMS:  Review of systems is positive for chronic bilateral leg pain.  Otherwise, all systems were reviewed and found to be per HPI or negative.  SOCIAL HISTORY:  He is an ex-smoker, quit some 20 years ago.  Rarely drinks alcohol.  No drugs.  He is employed by [employer]	[place] in [place].  FAMILY HISTORY:  Family history is significant for his mother who died with liver disease and leukemia.  His brother who has elevated cholesterol, hypertension and a history of benign colon polyps.  His father's health history is unknown.  PHYSICAL EXAMINATION:  GENERAL:  On physical exam this is a well- developed, well-nourished Caucasian male who is diaphoretic, lying in no apparent distress in the [facility] ED. VITAL SIGNS:  Temperature is 101 rectally,  pulse 124, blood pressure 92/60, respirations 18 per minute. HEAD:  Atraumatic, normocephalic. EYES:  Anicteric with pupils that are equal and round. NOSE:  Shows no nasal discharge or exterior lesions. MOUTH:  Has slightly dry mucous membranes.  He does have white patches on his tongue that appear to be Candida. He has moderate dentition. NECK:  Supple with midline trachea.  No JVD.  No lymphadenopathy. CHEST:  Demonstrates no accessory muscle use, however, he has very decreased breath sounds, particularly on the right, some wheezes on the left. HEART:  Tachycardic without murmurs, rubs or gallops. ABDOMEN:  Slightly distended, nontender.  I do not appreciate any hepatomegaly.  He has active bowel sounds.  EXTREMITIES:  Show no clubbing, cyanosis or edema.  His peripheral pulses are intact. SKIN:  Shows no rashes, bruises or lesions. NEURO:  Cranial nerves  II-XII are grossly intact.  He has no facial asymmetries, no obvious focal neuro deficits.  PSYCHIATRIC:  The patient is alert and oriented.  His demeanor is cooperative and appropriate. Grooming is moderate.  LABS:  Pertinent for white count 14.1, hemoglobin 13.9, hematocrit 41.8, platelets 154,000.  Sodium 132, potassium 4, BUN 15, creatinine 0.81, glucose 241. Urinalysis shows greater than 1000 glucose, moderate blood and trace leukocytes.  He has WBCs that are 7-10 per high-powered field. CT of his abdomen and pelvis shows multifocal airspace disease.  Next, nodular density in the lingula.  He will need a follow-up CT after his acute symptoms have resolved.  Next, he has nonobstructive densities on the at the left kidney lower pole.  These are likely to be small stones. Next, he has diffuse fatty liver.  ASSESSMENT:  Dr. Elliot Cousin has seen and examined the patient, collected a history, reviewed his chart and spoken at length with the patient, his wife, brother and the PA about the case.  Her impression is that this is a 60 year old gentleman who comes to the ED with: 1. Severe back pain, mild hematuria and multiple nonobstructive stones     on CT.  It is likely that the patient has passed a kidney stone. 2. Sepsis, likely from community-acquired pneumonia with fever,     tachycardia, hypotension and elevated WBC count.  He will admitted     to the Step-Down Unit, blood cultures will be taken.  He has been     started on azithromycin and Rocephin.  He will be given IV     hydration. 3. Community-acquired pneumonia.  Please see above.  Additionally, the     patient will receive nebulizations as per the new pneumonia     protocol.  Will check for HIV.  Given his tachycardia, will also     check cardiac enzymes.  Will follow up with a two-view chest x-ray     in the morning. 4. Pulmonary nodule in his lingula on CT.  The patient's wife     requested that Dr. Juanetta Gosling see the  patient in consultation for     this. This consult request has been made. 5. Type 2 diabetes, normally diet controlled.  Will check a HbA1c,     place him on a carb-modified diet and sliding scale insulin,     sensitive scale. 6. Neuropathy and chronic pain.  Will continue his hydrocodone, Lyrica     and add Dilaudid for severe     breakthrough pain. 7. This patient is a full code. 8. Further recommendations will be forthcoming pending this patient's     medical  evolution.     Stephani Police, PA   ______________________________ Elliot Cousin, M.D.    MLY/MEDQ  D:  04/29/2010  T:  04/29/2010  Job:  [id number]  cc:   Sharlet Salina L. Loreta Ave, [place] Dr. Regino Schultze, [place] Ace, [place]   Electronically Edited and Signed  By Algis Downs PA on 06/03/2010 03:19:12 PM Electronically Signed by Elliot Cousin M.D. on 06/03/2010 05:56:17 PM

## 2010-06-06 ENCOUNTER — Other Ambulatory Visit (HOSPITAL_COMMUNITY): Payer: Self-pay | Admitting: Pulmonary Disease

## 2010-06-06 ENCOUNTER — Ambulatory Visit (HOSPITAL_COMMUNITY)
Admission: RE | Admit: 2010-06-06 | Discharge: 2010-06-06 | Disposition: A | Discharge status: Home / Self Care | Payer: 59 | Source: Ambulatory Visit | Attending: Pulmonary Disease | Admitting: Pulmonary Disease

## 2010-06-06 DIAGNOSIS — J189 Pneumonia, unspecified organism: Secondary | ICD-10-CM

## 2010-06-06 DIAGNOSIS — A4902 Methicillin resistant Staphylococcus aureus infection, unspecified site: Secondary | ICD-10-CM | POA: Insufficient documentation

## 2010-07-26 ENCOUNTER — Ambulatory Visit (HOSPITAL_COMMUNITY)
Admission: RE | Admit: 2010-07-26 | Discharge: 2010-07-26 | Disposition: A | Discharge status: Home / Self Care | Payer: 59 | Source: Ambulatory Visit | Attending: Pulmonary Disease | Admitting: Pulmonary Disease

## 2010-07-26 ENCOUNTER — Other Ambulatory Visit (HOSPITAL_COMMUNITY): Payer: Self-pay | Admitting: Pulmonary Disease

## 2010-07-26 DIAGNOSIS — J189 Pneumonia, unspecified organism: Secondary | ICD-10-CM

## 2010-10-18 LAB — HEMOGLOBIN AND HEMATOCRIT, BLOOD
HCT: 45.1
Hemoglobin: 15.9

## 2010-11-27 ENCOUNTER — Other Ambulatory Visit (HOSPITAL_COMMUNITY): Payer: Self-pay | Admitting: Pulmonary Disease

## 2010-11-27 DIAGNOSIS — J189 Pneumonia, unspecified organism: Secondary | ICD-10-CM

## 2010-11-27 DIAGNOSIS — R222 Localized swelling, mass and lump, trunk: Secondary | ICD-10-CM

## 2010-12-03 ENCOUNTER — Ambulatory Visit (HOSPITAL_COMMUNITY)
Admission: RE | Admit: 2010-12-03 | Discharge: 2010-12-03 | Disposition: A | Discharge status: Home / Self Care | Payer: 59 | Source: Ambulatory Visit | Attending: Pulmonary Disease | Admitting: Pulmonary Disease

## 2010-12-03 DIAGNOSIS — R222 Localized swelling, mass and lump, trunk: Secondary | ICD-10-CM

## 2010-12-03 DIAGNOSIS — J984 Other disorders of lung: Secondary | ICD-10-CM | POA: Insufficient documentation

## 2010-12-03 DIAGNOSIS — J189 Pneumonia, unspecified organism: Secondary | ICD-10-CM

## 2012-04-06 ENCOUNTER — Other Ambulatory Visit (HOSPITAL_COMMUNITY): Payer: Self-pay | Admitting: Sports Medicine

## 2012-04-06 DIAGNOSIS — T84038A Mechanical loosening of other internal prosthetic joint, initial encounter: Secondary | ICD-10-CM

## 2012-04-06 DIAGNOSIS — Z96649 Presence of unspecified artificial hip joint: Secondary | ICD-10-CM

## 2012-04-07 ENCOUNTER — Other Ambulatory Visit (HOSPITAL_COMMUNITY): Payer: Self-pay | Admitting: Oncology

## 2012-04-07 ENCOUNTER — Encounter (HOSPITAL_COMMUNITY)
Admission: RE | Admit: 2012-04-07 | Discharge: 2012-04-07 | Disposition: A | Discharge status: Home / Self Care | Payer: 59 | Source: Ambulatory Visit | Attending: Sports Medicine | Admitting: Sports Medicine

## 2012-04-07 ENCOUNTER — Encounter (HOSPITAL_COMMUNITY): Payer: Self-pay

## 2012-04-07 DIAGNOSIS — T84038A Mechanical loosening of other internal prosthetic joint, initial encounter: Secondary | ICD-10-CM

## 2012-04-07 DIAGNOSIS — T84039A Mechanical loosening of unspecified internal prosthetic joint, initial encounter: Secondary | ICD-10-CM | POA: Insufficient documentation

## 2012-04-07 DIAGNOSIS — Z96649 Presence of unspecified artificial hip joint: Secondary | ICD-10-CM

## 2012-04-07 MED ORDER — TECHNETIUM TC 99M MEDRONATE IV KIT
25.0000 | PACK | Freq: Once | INTRAVENOUS | Status: AC | PRN
  Administered 2012-04-07: 25 via INTRAVENOUS

## 2012-10-17 ENCOUNTER — Emergency Department (HOSPITAL_COMMUNITY): Payer: 59

## 2012-10-17 ENCOUNTER — Other Ambulatory Visit: Payer: Self-pay

## 2012-10-17 ENCOUNTER — Encounter (HOSPITAL_COMMUNITY): Payer: Self-pay

## 2012-10-17 ENCOUNTER — Observation Stay (HOSPITAL_COMMUNITY)
Admission: EM | Admit: 2012-10-17 | Discharge: 2012-10-19 | Disposition: A | Discharge status: Home / Self Care | Payer: 59 | Attending: Internal Medicine | Admitting: Internal Medicine

## 2012-10-17 DIAGNOSIS — D696 Thrombocytopenia, unspecified: Secondary | ICD-10-CM | POA: Insufficient documentation

## 2012-10-17 DIAGNOSIS — Z6831 Body mass index (BMI) 31.0-31.9, adult: Secondary | ICD-10-CM | POA: Insufficient documentation

## 2012-10-17 DIAGNOSIS — R42 Dizziness and giddiness: Secondary | ICD-10-CM | POA: Insufficient documentation

## 2012-10-17 DIAGNOSIS — R079 Chest pain, unspecified: Principal | ICD-10-CM | POA: Insufficient documentation

## 2012-10-17 DIAGNOSIS — I10 Essential (primary) hypertension: Secondary | ICD-10-CM | POA: Insufficient documentation

## 2012-10-17 DIAGNOSIS — Z96649 Presence of unspecified artificial hip joint: Secondary | ICD-10-CM | POA: Insufficient documentation

## 2012-10-17 DIAGNOSIS — IMO0001 Reserved for inherently not codable concepts without codable children: Secondary | ICD-10-CM | POA: Insufficient documentation

## 2012-10-17 DIAGNOSIS — E669 Obesity, unspecified: Secondary | ICD-10-CM | POA: Diagnosis present

## 2012-10-17 DIAGNOSIS — E781 Pure hyperglyceridemia: Secondary | ICD-10-CM | POA: Insufficient documentation

## 2012-10-17 DIAGNOSIS — E1149 Type 2 diabetes mellitus with other diabetic neurological complication: Secondary | ICD-10-CM | POA: Insufficient documentation

## 2012-10-17 DIAGNOSIS — E119 Type 2 diabetes mellitus without complications: Secondary | ICD-10-CM | POA: Diagnosis present

## 2012-10-17 DIAGNOSIS — Z79899 Other long term (current) drug therapy: Secondary | ICD-10-CM | POA: Insufficient documentation

## 2012-10-17 DIAGNOSIS — E785 Hyperlipidemia, unspecified: Secondary | ICD-10-CM | POA: Diagnosis present

## 2012-10-17 DIAGNOSIS — R110 Nausea: Secondary | ICD-10-CM | POA: Insufficient documentation

## 2012-10-17 DIAGNOSIS — E1142 Type 2 diabetes mellitus with diabetic polyneuropathy: Secondary | ICD-10-CM | POA: Insufficient documentation

## 2012-10-17 HISTORY — DX: Pneumonia, unspecified organism: J18.9

## 2012-10-17 HISTORY — DX: Chest pain, unspecified: R07.9

## 2012-10-17 HISTORY — DX: Fibromyalgia: M79.7

## 2012-10-17 HISTORY — DX: Unspecified urethral stricture, male, unspecified site: N35.919

## 2012-10-17 HISTORY — DX: Polyneuropathy, unspecified: G62.9

## 2012-10-17 LAB — TROPONIN I: Troponin I: <0.3 ng/mL (ref <0.30)

## 2012-10-17 LAB — CBC WITH DIFFERENTIAL/PLATELET
Basophils Relative: 1 % (ref 0–1)
Eosinophils Absolute: 0.3 10*3/uL (ref 0.0–0.7)
HCT: 41.1 % (ref 39.0–52.0)
Hemoglobin: 14 g/dL (ref 13.0–17.0)
Lymphocytes Relative: 23 % (ref 12–46)
Lymphs Abs: 1.4 10*3/uL (ref 0.7–4.0)
MCHC: 34.1 g/dL (ref 30.0–36.0)
Monocytes Relative: 8 % (ref 3–12)
Neutro Abs: 3.9 10*3/uL (ref 1.7–7.7)
Neutrophils Relative %: 64 % (ref 43–77)
Platelets: 139 10*3/uL — ABNORMAL LOW (ref 150–400)
RBC: 4.54 MIL/uL (ref 4.22–5.81)
WBC: 6.1 10*3/uL (ref 4.0–10.5)

## 2012-10-17 LAB — BASIC METABOLIC PANEL
CO2: 26 mEq/L (ref 19–32)
Calcium: 9.6 mg/dL (ref 8.4–10.5)
Chloride: 99 mEq/L (ref 96–112)
GFR calc Af Amer: >90 mL/min (ref >90)
GFR calc non Af Amer: >90 mL/min (ref >90)
Potassium: 4 mEq/L (ref 3.5–5.1)
Sodium: 137 mEq/L (ref 135–145)

## 2012-10-17 MED ORDER — MORPHINE SULFATE 4 MG/ML IJ SOLN
4.0000 mg | Freq: Once | INTRAMUSCULAR | Status: AC
  Administered 2012-10-17: 4 mg via INTRAVENOUS
  Filled 2012-10-17: qty 1

## 2012-10-17 MED ORDER — ASPIRIN 325 MG PO TABS
325.0000 mg | ORAL_TABLET | Freq: Once | ORAL | Status: AC
  Administered 2012-10-18: 325 mg via ORAL
  Filled 2012-10-17: qty 1

## 2012-10-17 MED ORDER — ONDANSETRON HCL 4 MG/2ML IJ SOLN
4.0000 mg | Freq: Once | INTRAMUSCULAR | Status: AC
  Administered 2012-10-17: 4 mg via INTRAVENOUS
  Filled 2012-10-17: qty 2

## 2012-10-17 NOTE — ED Provider Notes (Signed)
CSN: [id number]     Arrival date & time 10/17/12  2105 History  This chart was scribed for Donnetta Hutching, MD by Carl Best, ED Scribe. This patient was seen in room [facility]08/[facility]08 and the patient's care was started at 9:42 PM.     Chief Complaint  Patient presents with  . Chest Pain  . Cough    The history is provided by the patient and the spouse. No language interpreter was used.   HPI Comments: [name] is a 62 y.o. male with a history of DM and neuropathy brought in by his wife who presents to the Emergency Department complaining of chest pressure.  The patient states that when he feels as though "someone is pressing on his chest" when he lies down.  He lists shortness of breath as an associated symptom.  The patient denies emesis as an associated symptom.  The patient's wife states that he had an episode of diaphoresis when he was at home.  The patient's wife states that the patient's legs were swollen last night and suspected it was caused by his neuropathy.  The patient's wife states that he is currently taking fish oil.  The patient states that at his last doctor's appointment his cholesterol levels were elevated.  The patient's wife states that the patient has a history of anxiety and depression.     The patient's PCP is Dr. Regino Schultze.    Past Medical History  Diagnosis Date  . Diabetes mellitus without complication   . Pneumonia    History reviewed. No pertinent past surgical history. No family history on file. History  Substance Use Topics  . Smoking status: Never Smoker   . Smokeless tobacco: Not on file  . Alcohol Use: No    Review of Systems  Constitutional: Positive for diaphoresis (pta).  Respiratory: Positive for chest tightness and shortness of breath.   Gastrointestinal: Negative for vomiting.  All other systems reviewed and are negative.    Allergies  Neurontin  Home Medications   Current Outpatient Rx  Name  Route  Sig  Dispense  Refill  . alprazolam  (XANAX) 2 MG tablet   Oral   Take 2 mg by mouth 3 (three) times daily as needed for sleep.         . cyclobenzaprine (FLEXERIL) 10 MG tablet   Oral   Take 10 mg by mouth 3 (three) times daily as needed for muscle spasms.         . metFORMIN (GLUCOPHAGE) 500 MG tablet   Oral   Take 500 mg by mouth 2 (two) times daily with a meal.         . oxyCODONE-acetaminophen (PERCOCET) 10-325 MG per tablet   Oral   Take 1 tablet by mouth every 4 (four) hours as needed for pain.          Triage Vitals BP 135/87  Pulse 90  Temp(Src) 98.7 F (37.1 C) (Oral)  Resp 21  Ht 6\' 5"  (1.956 m)  Wt 253 lb (114.76 kg)  BMI 30 kg/m2  SpO2 93%  Physical Exam  Nursing note and vitals reviewed. Constitutional: He is oriented to person, place, and time.  Slightly overweight.  HENT:  Head: Normocephalic and atraumatic.  Eyes: Conjunctivae and EOM are normal. Pupils are equal, round, and reactive to light.  Neck: Normal range of motion. Neck supple.  Cardiovascular: Normal rate, regular rhythm and normal heart sounds.   Pulmonary/Chest: Effort normal and breath sounds normal.  1+  peripheral edema.   Abdominal: Soft. Bowel sounds are normal.  Musculoskeletal: Normal range of motion.  Neurological: He is alert and oriented to person, place, and time.  Skin: Skin is warm and dry.  Psychiatric: He has a normal mood and affect.    ED Course  Procedures (including critical care time)  DIAGNOSTIC STUDIES: Oxygen Saturation is 93% on room air, adequate by my interpretation.    COORDINATION OF CARE: 9:46 PM- Discussed obtaining an EKG, blood work, and a chest x-ray and administering medication to alleviate the patient's chest pain.  The patient agreed to the treatment plan.    Labs Review Labs Reviewed  BASIC METABOLIC PANEL - Abnormal; Notable for the following:    Glucose, Bld 193 (*)    All other components within normal limits  CBC WITH DIFFERENTIAL - Abnormal; Notable for the following:     Platelets 139 (*)    All other components within normal limits  TROPONIN I   Imaging Review Dg Chest Port 1 View  10/17/2012   *RADIOLOGY REPORT*  Clinical Data: Cough and congestion; chest pressure and shortness of breath.  PORTABLE CHEST - 1 VIEW  Comparison: Chest radiograph performed 07/26/2010, and CT of the chest performed 12/03/2010  Findings: The lungs are well-aerated.  There is elevation of the right hemidiaphragm.  There is no evidence of focal opacification, pleural effusion or pneumothorax.  The cardiomediastinal silhouette is within normal limits.  No acute osseous abnormalities are seen.  IMPRESSION: No acute cardiopulmonary process seen.  Stable elevation of the right hemidiaphragm.   Original Report Authenticated By: Tonia Ghent, M.D.     Date: 10/17/2012  Rate: 86  Rhythm: normal sinus rhythm  QRS Axis: normal  Intervals: normal  ST/T Wave abnormalities: normal  Conduction Disutrbances: none  Narrative Interpretation: unremarkable     MDM  No diagnosis found. Patient complains of chest pain with  cardiac risk factors including diabetes and lipids. Admit to observation.  I personally performed the services described in this documentation, which was scribed in my presence. The recorded information has been reviewed and is accurate.    Donnetta Hutching, MD 10/17/12 2348

## 2012-10-17 NOTE — H&P (Signed)
[facility] History and Physical  [name]  UJW:[id number]  DOB: 1950/06/20   DOA: 10/17/2012   PCP:   Kirk Ruths, MD   Chief Complaint:  Chest pain since this afternoon  HPI: [name] is a 62 y.o. male.   Obese middle-aged Caucasian gentleman with a history of diabetes with neuropathy, hypertension and hyperlipidemia, but no family history of heart disease presents with sudden onset of chest pain while walking his doctors afternoon. It was associated with dizziness and and nausea and discomfort in his left arm. (The history is assisted by his wife has worked in [employer], [employer]/film/video) he came to the emergency room and has negative cardiac enzymes negative EKG but observation is requested because of his cardiac risk factors.  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 ;  Eyes:   eye pain, redness and discharge. ;  ENMT:   ear pain, hoarseness, nasal congestion, sinus pressure and sore throat. ;  Cardiovascular:    chest pain, palpitations, diaphoresis, dyspnea and peripheral edema.  Respiratory:   cough, hemoptysis, wheezing and stridor. ;  Gastrointestinal:  nausea, vomiting, diarrhea, constipation, abdominal pain, melena, blood in stool, hematemesis, jaundice and rectal bleeding. unusual weight loss..   Genitourinary:    frequency, dysuria, incontinence,flank pain and hematuria; Musculoskeletal:   back pain and neck pain.  swelling and trauma.;  Skin: .  pruritus, rash, abrasions, bruising and skin lesion.; ulcerations Neuro:    headache, lightheadedness and neck stiffness.  weakness, altered level of consciousness, altered mental status, extremity weakness, burning feet, involuntary movement, seizure and syncope.  Psych:    anxiety, depression, insomnia, tearfulness, panic attacks, hallucinations, paranoia, suicidal or homicidal ideation    Past  Medical History  Diagnosis Date  . Diabetes mellitus without complication   . Pneumonia     History reviewed. No pertinent past surgical history.  Medications:  HOME MEDS: Prior to Admission medications   Medication Sig Start Date End Date Taking? Authorizing Provider  acetaminophen (TYLENOL) 500 MG tablet Take 500 mg by mouth every 6 (six) hours as needed for pain.   Yes Historical Provider, MD  alprazolam Prudy Feeler) 2 MG tablet Take 2 mg by mouth 3 (three) times daily as needed for sleep.   Yes Historical Provider, MD  cyclobenzaprine (FLEXERIL) 10 MG tablet Take 10 mg by mouth 3 (three) times daily as needed for muscle spasms.   Yes Historical Provider, MD  metFORMIN (GLUCOPHAGE) 500 MG tablet Take 500 mg by mouth 2 (two) times daily with a meal.   Yes Historical Provider, MD  Oxycodone HCl 20 MG TABS Take 20 mg by mouth 2 (two) times daily.   Yes Historical Provider, MD  oxyCODONE-acetaminophen (PERCOCET) 10-325 MG per tablet Take 1-2 tablets by mouth every 4 (four) hours as needed for pain.    Yes Historical Provider, MD  pregabalin (LYRICA) 300 MG capsule Take 300 mg by mouth 2 (two) times daily.   Yes Historical Provider, MD     Allergies:  Allergies  Allergen Reactions  . Neurontin [Gabapentin] Other (See Comments)    Causes seizures    Social History:   reports that he has never smoked. He does not have any smokeless tobacco history on file. He reports that he does not drink alcohol. His drug history is not on file.  Family History: No family history  on file. Negative for cardiac disease  Physical Exam: Filed Vitals:   10/17/12 2132 10/17/12 2200  BP: 135/87 120/70  Pulse: 90 83  Temp: 98.7 F (37.1 C)   TempSrc: Oral   Resp: 21 15  Height: 6\' 5"  (1.956 m)   Weight: 114.76 kg (253 lb)   SpO2: 93%    Blood pressure 120/70, pulse 83, temperature 98.7 F (37.1 C), temperature source Oral, resp. rate 15, height 6\' 5"  (1.956 m), weight 114.76 kg (253 lb), SpO2  93.00%. Body mass index is 30 kg/(m^2).   GEN:  Pleasant middle-aged Caucasian gentleman lying bed in no acute distress; cooperative with exam PSYCH:  alert and oriented x4;  anxious nor depressed; affect is appropriate. HEENT: Mucous membranes pink and anicteric; PERRLA; EOM intact; no cervical lymphadenopathy nor thyromegaly or carotid bruit; no JVD; Breasts:: Not examined CHEST WALL: No tenderness CHEST: Normal respiration, coarse breath HEART: Regular rate and rhythm; no murmurs rubs or gallops ABDOMEN: Obese, soft non-tender; no masses, no organomegaly, normal abdominal bowel sounds;  Rectal Exam: Not done EXTREMITIES: No bone or joint deformity no edema; no ulcerations. Genitalia: not examined PULSES: 2+ and symmetric SKIN: Normal hydration no rash or ulceration CNS: Cranial nerves 2-12 grossly intact no focal lateralizing neurologic deficit   Labs on Admission:  Basic Metabolic Panel:  Recent Labs Lab 10/17/12 2222  NA 137  K 4.0  CL 99  CO2 26  GLUCOSE 193*  BUN 13  CREATININE 0.66  CALCIUM 9.6   Liver Function Tests: No results found for this basename: AST, ALT, ALKPHOS, BILITOT, PROT, ALBUMIN,  in the last 168 hours No results found for this basename: LIPASE, AMYLASE,  in the last 168 hours No results found for this basename: AMMONIA,  in the last 168 hours CBC:  Recent Labs Lab 10/17/12 2222  WBC 6.1  NEUTROABS 3.9  HGB 14.0  HCT 41.1  MCV 90.5  PLT 139*   Cardiac Enzymes:  Recent Labs Lab 10/17/12 2222  TROPONINI <0.30   BNP: No components found with this basename: POCBNP,  D-dimer: No components found with this basename: D-DIMER,  CBG: No results found for this basename: GLUCAP,  in the last 168 hours  Radiological Exams on Admission: Dg Chest Port 1 View  10/17/2012   *RADIOLOGY REPORT*  Clinical Data: Cough and congestion; chest pressure and shortness of breath.  PORTABLE CHEST - 1 VIEW  Comparison: Chest radiograph performed 07/26/2010,  and CT of the chest performed 12/03/2010  Findings: The lungs are well-aerated.  There is elevation of the right hemidiaphragm.  There is no evidence of focal opacification, pleural effusion or pneumothorax.  The cardiomediastinal silhouette is within normal limits.  No acute osseous abnormalities are seen.  IMPRESSION: No acute cardiopulmonary process seen.  Stable elevation of the right hemidiaphragm.   Original Report Authenticated By: Tonia Ghent, M.D.    EKG: Independently reviewed: Normal sinus rhythm no ST segment changes.   Assessment/Plan   Active Problems:   Chest pain   Diabetes mellitus, type 2   Other and unspecified hyperlipidemia   Obesity, unspecified    PLAN: Admit for serial cardiac enzyme and cardiac monitoring Cardiology evaluation as an in or outpatient  Other plans as per orders.  Code Status: Full  Family Communication:  Plans discuss with patient and wife at bedside Disposition Plan: Likely home in a day or 2    [name] Nocturnist [facility] Pager [phone]   10/17/2012, 11:44 PM

## 2012-10-17 NOTE — ED Notes (Signed)
Pt with cough and congestion since last night, chest pressure, sob

## 2012-10-18 ENCOUNTER — Encounter (HOSPITAL_COMMUNITY): Payer: Self-pay | Admitting: Internal Medicine

## 2012-10-18 DIAGNOSIS — E119 Type 2 diabetes mellitus without complications: Secondary | ICD-10-CM | POA: Diagnosis present

## 2012-10-18 DIAGNOSIS — R079 Chest pain, unspecified: Secondary | ICD-10-CM | POA: Diagnosis present

## 2012-10-18 DIAGNOSIS — E785 Hyperlipidemia, unspecified: Secondary | ICD-10-CM | POA: Diagnosis present

## 2012-10-18 DIAGNOSIS — I517 Cardiomegaly: Secondary | ICD-10-CM

## 2012-10-18 DIAGNOSIS — E669 Obesity, unspecified: Secondary | ICD-10-CM | POA: Diagnosis present

## 2012-10-18 DIAGNOSIS — D696 Thrombocytopenia, unspecified: Secondary | ICD-10-CM | POA: Diagnosis present

## 2012-10-18 LAB — BASIC METABOLIC PANEL
BUN: 13 mg/dL (ref 6–23)
CO2: 27 mEq/L (ref 19–32)
Calcium: 9.1 mg/dL (ref 8.4–10.5)
GFR calc non Af Amer: >90 mL/min (ref >90)
Glucose, Bld: 155 mg/dL — ABNORMAL HIGH (ref 70–99)
Sodium: 138 mEq/L (ref 135–145)

## 2012-10-18 LAB — CBC
Hemoglobin: 13.4 g/dL (ref 13.0–17.0)
MCH: 30.7 pg (ref 26.0–34.0)
MCHC: 33.9 g/dL (ref 30.0–36.0)
MCV: 90.6 fL (ref 78.0–100.0)
Platelets: 125 10*3/uL — ABNORMAL LOW (ref 150–400)
RBC: 4.36 MIL/uL (ref 4.22–5.81)
RDW: 14.3 % (ref 11.5–15.5)

## 2012-10-18 LAB — LIPID PANEL
Cholesterol: 165 mg/dL (ref 0–200)
HDL: 24 mg/dL — ABNORMAL LOW (ref >39)
LDL Cholesterol: 77 mg/dL (ref 0–99)
Total CHOL/HDL Ratio: 6.9 RATIO
Triglycerides: 318 mg/dL — ABNORMAL HIGH (ref <150)

## 2012-10-18 LAB — MAGNESIUM: Magnesium: 2.1 mg/dL (ref 1.5–2.5)

## 2012-10-18 LAB — GLUCOSE, CAPILLARY
Glucose-Capillary: 153 mg/dL — ABNORMAL HIGH (ref 70–99)
Glucose-Capillary: 115 mg/dL — ABNORMAL HIGH (ref 70–99)
Glucose-Capillary: 132 mg/dL — ABNORMAL HIGH (ref 70–99)

## 2012-10-18 LAB — HEMOGLOBIN A1C: Hgb A1c MFr Bld: 6.6 % — ABNORMAL HIGH (ref <5.7)

## 2012-10-18 LAB — TROPONIN I: Troponin I: <0.3 ng/mL (ref <0.30)

## 2012-10-18 LAB — D-DIMER, QUANTITATIVE: D-Dimer, Quant: 0.6 ug/mL-FEU — ABNORMAL HIGH (ref 0.00–0.48)

## 2012-10-18 MED ORDER — ASPIRIN EC 81 MG PO TBEC
81.0000 mg | DELAYED_RELEASE_TABLET | Freq: Every day | ORAL | Status: DC
  Administered 2012-10-18 – 2012-10-19 (×2): 81 mg via ORAL
  Filled 2012-10-18 (×2): qty 1

## 2012-10-18 MED ORDER — SODIUM CHLORIDE 0.9 % IJ SOLN: 3.0000 mL | Freq: Two times a day (BID) | INTRAMUSCULAR | Status: DC

## 2012-10-18 MED ORDER — MUPIROCIN 2 % EX OINT
1.0000 "application " | TOPICAL_OINTMENT | Freq: Two times a day (BID) | CUTANEOUS | Status: DC
  Administered 2012-10-18 – 2012-10-19 (×3): 1 via NASAL
  Filled 2012-10-18: qty 22

## 2012-10-18 MED ORDER — INSULIN ASPART 100 UNIT/ML ~~LOC~~ SOLN
0.0000 [IU] | Freq: Three times a day (TID) | SUBCUTANEOUS | Status: DC
  Administered 2012-10-18 (×2): 1 [IU] via SUBCUTANEOUS

## 2012-10-18 MED ORDER — INSULIN ASPART 100 UNIT/ML ~~LOC~~ SOLN: 0.0000 [IU] | Freq: Every day | SUBCUTANEOUS | Status: DC

## 2012-10-18 MED ORDER — ONDANSETRON HCL 4 MG/2ML IJ SOLN: 4.0000 mg | INTRAMUSCULAR | Status: DC | PRN

## 2012-10-18 MED ORDER — PREGABALIN 75 MG PO CAPS
300.0000 mg | ORAL_CAPSULE | Freq: Two times a day (BID) | ORAL | Status: DC
  Administered 2012-10-18 – 2012-10-19 (×4): 300 mg via ORAL
  Filled 2012-10-18 (×4): qty 4

## 2012-10-18 MED ORDER — CYCLOBENZAPRINE HCL 10 MG PO TABS: 10.0000 mg | ORAL_TABLET | Freq: Three times a day (TID) | ORAL | Status: DC | PRN

## 2012-10-18 MED ORDER — FLEET ENEMA 7-19 GM/118ML RE ENEM: 1.0000 | ENEMA | Freq: Once | RECTAL | Status: AC | PRN

## 2012-10-18 MED ORDER — NITROGLYCERIN 0.4 MG SL SUBL: 0.4000 mg | SUBLINGUAL_TABLET | SUBLINGUAL | Status: DC | PRN

## 2012-10-18 MED ORDER — ACETAMINOPHEN 325 MG PO TABS: 650.0000 mg | ORAL_TABLET | ORAL | Status: DC | PRN

## 2012-10-18 MED ORDER — ALPRAZOLAM 1 MG PO TABS
2.0000 mg | ORAL_TABLET | Freq: Three times a day (TID) | ORAL | Status: DC | PRN
  Administered 2012-10-18 (×2): 2 mg via ORAL
  Filled 2012-10-18 (×2): qty 2

## 2012-10-18 MED ORDER — OXYCODONE HCL 5 MG PO TABS
5.0000 mg | ORAL_TABLET | ORAL | Status: DC | PRN
  Administered 2012-10-19: 5 mg via ORAL
  Filled 2012-10-18: qty 1

## 2012-10-18 MED ORDER — ENOXAPARIN SODIUM 60 MG/0.6ML ~~LOC~~ SOLN
60.0000 mg | SUBCUTANEOUS | Status: DC
  Administered 2012-10-18 – 2012-10-19 (×2): 60 mg via SUBCUTANEOUS
  Filled 2012-10-18 (×2): qty 0.6

## 2012-10-18 MED ORDER — METFORMIN HCL 500 MG PO TABS
500.0000 mg | ORAL_TABLET | Freq: Two times a day (BID) | ORAL | Status: DC
  Administered 2012-10-18 – 2012-10-19 (×3): 500 mg via ORAL
  Filled 2012-10-18 (×3): qty 1

## 2012-10-18 MED ORDER — POTASSIUM CHLORIDE IN NACL 20-0.9 MEQ/L-% IV SOLN
INTRAVENOUS | Status: DC
  Administered 2012-10-18 (×2): via INTRAVENOUS

## 2012-10-18 MED ORDER — REGADENOSON 0.4 MG/5ML IV SOLN
0.4000 mg | Freq: Once | INTRAVENOUS | Status: AC
  Filled 2012-10-18: qty 5

## 2012-10-18 MED ORDER — HYDROMORPHONE HCL PF 1 MG/ML IJ SOLN: 0.5000 mg | INTRAMUSCULAR | Status: DC | PRN

## 2012-10-18 MED ORDER — CHLORHEXIDINE GLUCONATE CLOTH 2 % EX PADS
6.0000 | MEDICATED_PAD | Freq: Every day | CUTANEOUS | Status: DC
  Administered 2012-10-19: 6 via TOPICAL

## 2012-10-18 NOTE — Progress Notes (Signed)
UR Chart Review Completed  

## 2012-10-18 NOTE — Progress Notes (Signed)
  RD consulted for nutrition education regarding diabetes and weight loss.   Lab Results  Component Value Date   HGBA1C  Value: 8.1 (NOTE)                                                                       According to the ADA Clinical Practice Recommendations for 2011, when HbA1c is used as a screening test:   >=6.5%   Diagnostic of Diabetes Mellitus           (if abnormal result  is confirmed)  5.7-6.4%   Increased risk of developing Diabetes Mellitus  References:Diagnosis and Classification of Diabetes Mellitus,Diabetes Care,2011,34(Suppl 1):S62-S69 and Standards of Medical Care in         Diabetes - 2011,Diabetes Care,2011,34  (Suppl 1):S11-S61.* 04/29/2010  Pt spouse is very well versed in current nutrition recommendations including eating 4-6 small meals per day, sugar-free beverages, lean protein, low-fat dairy and whole grains. She is also a member of diabetes group on facebook and is familiar with the American Diabetes Association website resources. Pt began implementing above diet modifications over past 90 days and says her husband's most recent HgA1C- 6% (current value is pending).   RD provided "Carbohydrate Counting for People with Diabetes" and "Weight Loss Tips" handouts from the Academy of Nutrition and Dietetics. Provided list of carbohydrates and recommended serving sizes of common foods.  Encouraged continued compliance of controlled and consistent carbohydrate intake throughout the day and intake of high-fiber, whole grain complex carbohydrates. Teach back method used.  Body mass index is 31.86 kg/(m^2). Pt meets criteria for obesity class I based on current BMI.  RD contact information provided. If additional nutrition issues arise, please re-consult RD.  Royann Shivers MS,RD,LDN,CSG Office: [phone] Pager: [phone]

## 2012-10-18 NOTE — Progress Notes (Signed)
[facility] PROGRESS NOTE  [name] GUY:[id number] DOB: 1950-07-21 DOA: 10/17/2012 PCP: Kirk Ruths, MD  Assessment/Plan: Chest pain:  With exertion. No further episodes. Trop neg x3, no events on tele. Continue asa, will likely need statin.  Await HgA1c, TSH and lipid panel. Risk factors include diabetes, obesity.  May benefit from stress test. Will request cards consult for evaluation.   Diabetes mellitus, type 2. A1c pending. On metformin only. Will continue carb mod diet and use SSI as well   Other and unspecified hyperlipidemia: await lipid panel. Will likely need statin   Obesity, unspecified: BMI 31.9. Nutritional consult.   Thrombocytopenia: mild. No s/sx bleeding. Will need OP follow up.  Code Status: full Family Communication: none available Disposition Plan: home either this afternoon or in am   Consultants:  cardiology  Procedures:  none  Antibiotics:  none  HPI/Subjective: Sitting up eating breakfast. Denies any further chest pain. Complains "diabetic neuropathy pain".  Objective: Filed Vitals:   10/18/12 0513  BP: 117/67  Pulse: 81  Temp: 97.9 F (36.6 C)  Resp: 20   No intake or output data in the 24 hours ending 10/18/12 0925 Filed Weights   10/17/12 2132 10/18/12 0032 10/18/12 0513  Weight: 114.76 kg (253 lb) 121.9 kg (268 lb 11.9 oz) 121.9 kg (268 lb 11.9 oz)    Exam:   General:  Pleasant, obese NAD  Cardiovascular: RRR No MGR No LE edema  Respiratory: normal effort BS clear bilaterally no wheeze no rhonchi. Trace LE edema  Abdomen: obese, soft +BS non-tender to palpation  Musculoskeletal: no clubbing no cyanosis, good muscle tone   Data Reviewed: Basic Metabolic Panel:  Recent Labs Lab 10/17/12 2222 10/18/12 0239 10/18/12 0241  NA 137  --  138  K 4.0  --  4.2  CL 99  --  101  CO2 26  --  27  GLUCOSE 193*  --  155*  BUN 13  --  13  CREATININE 0.66  --  0.70  CALCIUM 9.6  --  9.1  MG  --  2.1  --     Liver Function Tests: No results found for this basename: AST, ALT, ALKPHOS, BILITOT, PROT, ALBUMIN,  in the last 168 hours No results found for this basename: LIPASE, AMYLASE,  in the last 168 hours No results found for this basename: AMMONIA,  in the last 168 hours CBC:  Recent Labs Lab 10/17/12 2222 10/18/12 0241  WBC 6.1 6.4  NEUTROABS 3.9  --   HGB 14.0 13.4  HCT 41.1 39.5  MCV 90.5 90.6  PLT 139* 125*   Cardiac Enzymes:  Recent Labs Lab 10/17/12 2222 10/18/12 0239 10/18/12 0756  TROPONINI <0.30 <0.30 <0.30   BNP (last 3 results) No results found for this basename: PROBNP,  in the last 8760 hours CBG:  Recent Labs Lab 10/18/12 0220 10/18/12 0757  GLUCAP 153* 115*    Recent Results (from the past 240 hour(s))  MRSA PCR SCREENING     Status: Abnormal   Collection Time    10/18/12  2:24 AM      Result Value Range Status   MRSA by PCR POSITIVE (*) NEGATIVE Final   Comment:            The GeneXpert MRSA Assay (FDA     approved for NASAL specimens     only), is one component of a     comprehensive MRSA colonization     surveillance program. It is not  intended to diagnose MRSA     infection nor to guide or     monitor treatment for     MRSA infections.     RESULT CALLED TO, READ BACK BY AND VERIFIED WITH:      ROGERS,L @ 0413 ON 10/18/12 BY Anner Crete     Studies: Dg Chest Port 1 View  10/17/2012   *RADIOLOGY REPORT*  Clinical Data: Cough and congestion; chest pressure and shortness of breath.  PORTABLE CHEST - 1 VIEW  Comparison: Chest radiograph performed 07/26/2010, and CT of the chest performed 12/03/2010  Findings: The lungs are well-aerated.  There is elevation of the right hemidiaphragm.  There is no evidence of focal opacification, pleural effusion or pneumothorax.  The cardiomediastinal silhouette is within normal limits.  No acute osseous abnormalities are seen.  IMPRESSION: No acute cardiopulmonary process seen.  Stable elevation of the right  hemidiaphragm.   Original Report Authenticated By: Tonia Ghent, M.D.    Scheduled Meds: . aspirin EC  81 mg Oral Daily  . Chlorhexidine Gluconate Cloth  6 each Topical Q0600  . enoxaparin (LOVENOX) injection  60 mg Subcutaneous Q24H  . insulin aspart  0-5 Units Subcutaneous QHS  . insulin aspart  0-9 Units Subcutaneous TID WC  . metFORMIN  500 mg Oral BID WC  . mupirocin ointment  1 application Nasal BID  . pregabalin  300 mg Oral BID  . sodium chloride  3 mL Intravenous Q12H   Continuous Infusions: . 0.9 % NaCl with KCl 20 mEq / L 50 mL/hr at 10/18/12 0214    Principal Problem:   Chest pain Active Problems:   Diabetes mellitus, type 2   Other and unspecified hyperlipidemia   Obesity, unspecified    Time spent: 35 minutes    [facility]  [facility] Pager [phone]. If 7PM-7AM, please contact night-coverage at [email or web address], password [facility] 10/18/2012, 9:25 AM  LOS: 1 day      Patient seen and examined, awaiting lexiscan; agree with the above plan of care;   [name], Isaiah Serge

## 2012-10-18 NOTE — Care Management Note (Addendum)
    Page 1 of 1   10/19/2012     3:33:26 PM   CARE MANAGEMENT NOTE 10/19/2012  Patient:  [name], [name]   Account Number:  [id number]  Date Initiated:  10/18/2012  Documentation initiated by:  Sharrie Rothman  Subjective/Objective Assessment:   Pt admitted from home with CP. Pt lives with his wife and will return home at discharge.Pt has a walker for home use. According to pts wife, he is fairly independent with ADL's.     Action/Plan:   No CM needs noted.   Anticipated DC Date:  10/19/2012   Anticipated DC Plan:  HOME/SELF CARE      DC Planning Services  CM consult      Choice offered to / List presented to:             Status of service:  Completed, signed off Medicare Important Message given?   (If response is "NO", the following Medicare IM given date fields will be blank) Date Medicare IM given:   Date Additional Medicare IM given:    Discharge Disposition:  HOME/SELF CARE  Per UR Regulation:    If discussed at Long Length of Stay Meetings, dates discussed:    Comments:  10/19/12 1530 Arlyss Queen, RN BSN CM Pt discharged home today. No CM needs noted.  10/18/12 1350 Arlyss Queen, RN BSN CM

## 2012-10-18 NOTE — Consult Note (Signed)
CARDIOLOGY CONSULT NOTE   Patient ID: [name] MRN: [id number] DOB/AGE: 62-Jun-1952 62 y.o.  Admit Date: 10/17/2012 Referring Physician: PTH  Primary Physician: Kirk Ruths, MD Consulting Cardiologist: Dina Rich MD Primary Cardiologist: New: Gearldene Fiorenza Reason for Consultation: Chest Pain  Clinical Summary [name] is a 62 y.o.male with no prior cardiac history, but with history of hypertension and diabetes who presented to the emergency room on 10/17/2012 with complaints of chest pressure and shortness of breath. The patient was out walking his dog yesterday and began to have shortness of breath "did not feel right" returned home, he tried to lie down in his bed, but had pressure in his chest and could not get comfortable. The pain and pressure did not radiate. Due to his concerns of his symptoms he presented to the emergency room.   On arrival to the emergency room the patient's blood pressure was 135/87, O2 sat 93% with a heart rate of 90 beats per minute. EKG revealed normal sinus rhythm without evidence of ACS. Chest x-ray was negative for CHF, pneumonia, or pulmonary edema. D-dimer, and pro BNP was not completed. Cardiac enzymes are negative x3. Cardiovascular risk factors and symptoms, we are asked to evaluate this patient.    The patient states that his chest pressure subsided after coming to his room last evening. Was able to sleep through the night without associated dyspnea or chest pressure. The patient states he has had a stress test approximately 15 years ago at [facility] for preoperative evaluation for hip replacement. Review of records demonstrates that time he had no evidence of pharmacologic stress-induced ischemia with an LVEF of 63%. His only complaint at this time with the exception of lower extremity pain associated with diabetic neuropathy. This apparently is chronic for him.   Allergies  Allergen Reactions  . Neurontin [Gabapentin] Other (See Comments)      Causes seizures    Medications Scheduled Medications: . aspirin EC  81 mg Oral Daily  . Chlorhexidine Gluconate Cloth  6 each Topical Q0600  . enoxaparin (LOVENOX) injection  60 mg Subcutaneous Q24H  . insulin aspart  0-5 Units Subcutaneous QHS  . insulin aspart  0-9 Units Subcutaneous TID WC  . metFORMIN  500 mg Oral BID WC  . mupirocin ointment  1 application Nasal BID  . pregabalin  300 mg Oral BID  . sodium chloride  3 mL Intravenous Q12H    Infusions: . 0.9 % NaCl with KCl 20 mEq / L 50 mL/hr at 10/18/12 0214    PRN Medications: acetaminophen, alprazolam, cyclobenzaprine, HYDROmorphone (DILAUDID) injection, nitroGLYCERIN, ondansetron (ZOFRAN) IV, oxyCODONE, sodium phosphate   Past Medical History  Diagnosis Date  . Diabetes mellitus without complication   . Pneumonia   . Chest pain     10/14  . Urethral stricture     s/p dilitation  . Neuropathy     compression neuropathy right hip;s/p replacement  . Fibromyalgia     Past Surgical History  Procedure Laterality Date  . Left hip replacement      Family History  Problem Relation Age of Onset  . Hyperlipidemia Brother   . Cancer Mother     Deceased    Social History [name] reports that he has never smoked. He does not have any smokeless tobacco history on file. [name] reports that he does not drink alcohol.  Review of Systems Otherwise reviewed and negative except as outlined.  Physical Examination Blood pressure 117/67, pulse 81, temperature  97.9 F (36.6 C), temperature source Oral, resp. rate 20, height 6\' 5"  (1.956 m), weight 268 lb 11.9 oz (121.9 kg), SpO2 97.00%. No intake or output data in the 24 hours ending 10/18/12 1018  Telemetry: NSR  HEENT: Conjunctiva and lids normal, oropharynx clear with moist mucosa. Neck: Supple, no elevated JVP or carotid bruits, no thyromegaly. Lungs: Some scattered bibasilar crackles. No wheezes or coughing Cardiac: Regular rate and rhythm, no S3 or  significant systolic murmur, no pericardial rub. Abdomen: Distended nontender, no hepatomegaly, bowel sounds present, no guarding or rebound. Extremities: No pitting edema, distal pulses 2+. Skin: Warm and dry. Musculoskeletal: No kyphosis. Neuropsychiatric: Alert and oriented x3, affect grossly appropriate.  Prior Cardiac Testing/Procedures  1. Pharmacologic Stress Test IMPRESSION 1. NO EVIDENCE OF PHARMACOLOGIC STRESS INDUCED ISCHEMIA. 2. LEFT VENTRICULAR EJECTION FRACTION 63%.   Lab Results  Basic Metabolic Panel:  Recent Labs Lab 10/17/12 2222 10/18/12 0239 10/18/12 0241  NA 137  --  138  K 4.0  --  4.2  CL 99  --  101  CO2 26  --  27  GLUCOSE 193*  --  155*  BUN 13  --  13  CREATININE 0.66  --  0.70  CALCIUM 9.6  --  9.1  MG  --  2.1  --     CBC:  Recent Labs Lab 10/17/12 2222 10/18/12 0241  WBC 6.1 6.4  NEUTROABS 3.9  --   HGB 14.0 13.4  HCT 41.1 39.5  MCV 90.5 90.6  PLT 139* 125*    Cardiac Enzymes:  Recent Labs Lab 10/17/12 2222 10/18/12 0239 10/18/12 0756  TROPONINI <0.30 <0.30 <0.30      Radiology: Dg Chest Port 1 View  10/17/2012   *RADIOLOGY REPORT*  Clinical Data: Cough and congestion; chest pressure and shortness of breath.  PORTABLE CHEST - 1 VIEW  Comparison: Chest radiograph performed 07/26/2010, and CT of the chest performed 12/03/2010  Findings: The lungs are well-aerated.  There is elevation of the right hemidiaphragm.  There is no evidence of focal opacification, pleural effusion or pneumothorax.  The cardiomediastinal silhouette is within normal limits.  No acute osseous abnormalities are seen.  IMPRESSION: No acute cardiopulmonary process seen.  Stable elevation of the right hemidiaphragm.   Original Report Authenticated By: Tonia Ghent, M.D.     ECG:NSR rate of 74 bpm.   Impression and Recommendations  1. Chest Pressure: To his cardiovascular risk factors are hypertension, hypertriglyceridemia, obesity, admitted with  chest pressure and dyspnea after walking he stopped. Patient had increased pressure 1 Down. With associated shortness of breath. Presented to the emergency room. EKG negative for acute ACS, normal cardiac enzymes. Chest x-ray negative for pneumonia or CHF. Likely benefit from a stress Myoview as one was not been completed in 15 years with multiple cardiovascular risk factors. He is currently been placed on aspirin 81 mg daily. We can plan for inpatient stress Myoview vs. allowing him to go home and have outpatient stress Myoview. I have ordered d-dimer.  2. Hypertension: Echocardiogram is ordered for evaluation of LV fx . He is not on ACE inhibitor at present or any antihypertensive medications at home. Consider adding ACE for cardiorenal protection with DM.  3. Diabetes: Hgb A1C is to ordered. Remains on metformin and SS insulin coverage.  4. Hypertriglyceridemia: Likely related to diabetes.Consider fenofibrate.   4. Thrombocytopenia: PLTs 125 this am, trending downward from last check in April 2014. Work-up per PTH at their discretion.   Signed: Bettey Mare.  Lyman Bishop NP [facility] 10/18/2012, 10:18 AM Co-Sign MD  Attending Note Patient seen and discussed with NP Lyman Bishop, I agree with her documentation above. 62 yo male with multiple CAD risk factors including DM and HTN admitted with episode of chest pain. EKG without acute ischemic changes, troponins negative. Describes symptoms concerning for cardiac pain, also history of some worsening DOE over the last few weeks. There is concern for possible obstructive CAD as the source of his symptoms. Will order lexiscan MPI for tomorrow. Patient with history of hip replacement and is not able to run on treadmill.    Dina Rich MD

## 2012-10-19 ENCOUNTER — Encounter (HOSPITAL_COMMUNITY): Payer: Self-pay

## 2012-10-19 ENCOUNTER — Observation Stay (HOSPITAL_COMMUNITY): Payer: 59

## 2012-10-19 DIAGNOSIS — R079 Chest pain, unspecified: Secondary | ICD-10-CM

## 2012-10-19 LAB — GLUCOSE, CAPILLARY
Glucose-Capillary: 101 mg/dL — ABNORMAL HIGH (ref 70–99)
Glucose-Capillary: 98 mg/dL (ref 70–99)

## 2012-10-19 LAB — CBC
HCT: 44.4 % (ref 39.0–52.0)
Hemoglobin: 15 g/dL (ref 13.0–17.0)
MCH: 30.9 pg (ref 26.0–34.0)
MCV: 91.4 fL (ref 78.0–100.0)
RBC: 4.86 MIL/uL (ref 4.22–5.81)
RDW: 14.4 % (ref 11.5–15.5)

## 2012-10-19 MED ORDER — FENOFIBRATE 54 MG PO TABS: 54.0000 mg | ORAL_TABLET | Freq: Every day | ORAL | Status: DC

## 2012-10-19 MED ORDER — ATORVASTATIN CALCIUM 10 MG PO TABS: 10.0000 mg | ORAL_TABLET | Freq: Every day | ORAL | Status: DC

## 2012-10-19 MED ORDER — TECHNETIUM TC 99M SESTAMIBI - CARDIOLITE
10.0000 | Freq: Once | INTRAVENOUS | Status: AC | PRN
  Administered 2012-10-19: 08:00:00 10 via INTRAVENOUS

## 2012-10-19 MED ORDER — REGADENOSON 0.4 MG/5ML IV SOLN
INTRAVENOUS | Status: AC
  Administered 2012-10-19: 0.4 mg via INTRAVENOUS
  Filled 2012-10-19: qty 5

## 2012-10-19 MED ORDER — SODIUM CHLORIDE 0.9 % IJ SOLN
INTRAMUSCULAR | Status: AC
  Administered 2012-10-19: 10 mL via INTRAVENOUS
  Filled 2012-10-19: qty 10

## 2012-10-19 MED ORDER — TECHNETIUM TC 99M SESTAMIBI GENERIC - CARDIOLITE
30.0000 | Freq: Once | INTRAVENOUS | Status: AC | PRN
  Administered 2012-10-19: 30 via INTRAVENOUS

## 2012-10-19 MED ORDER — FENOFIBRATE 54 MG PO TABS
54.0000 mg | ORAL_TABLET | Freq: Every day | ORAL | Status: DC
  Filled 2012-10-19: qty 1

## 2012-10-19 NOTE — Discharge Summary (Signed)
Physician Discharge Summary  [name] ZOX:[id number] DOB: 09-16-1950 DOA: 10/17/2012  PCP: Kirk Ruths, MD  Admit date: 10/17/2012 Discharge date: 10/19/2012  Time spent: 40 minutes  Recommendations for Outpatient Follow-up:  1. Follow up with PCP 1 week for evaluation of symptoms 2. Follow up with [facility] 2-3 weeks for re-evaluation.   Discharge Diagnoses:  Principal Problem:   Chest pain Active Problems:   Diabetes mellitus, type 2   Other and unspecified hyperlipidemia   Obesity, unspecified   Thrombocytopenia, unspecified   Discharge Condition: stable  Diet recommendation: heart healthy  Filed Weights   10/18/12 0032 10/18/12 0513 10/19/12 0502  Weight: 121.9 kg (268 lb 11.9 oz) 121.9 kg (268 lb 11.9 oz) 118.7 kg (261 lb 11 oz)    History of present illness:  [name] is a  Obese middle-aged Caucasian gentleman with a history of diabetes with neuropathy, hypertension and hyperlipidemia, but no family history of heart disease presented to ED on 10/17/12 with sudden onset of chest pain while walking his dog. It was associated with dizziness and and nausea and discomfort in his left arm. (The history was assisted by his wife has worked in [employer], [employer]/film/video) he came to the emergency room and had negative cardiac enzymes negative EKG but observation was requested because of his cardiac risk factors.      Hospital Course:  Chest pain: With exertion. Admitted to tele. Patient had no further episodes of chest pain. Trop neg x3, no events on tele. Provided with asa and statin. HgA1c 6.6, TSH within limits of normal. Lipid panel yields triglyceride 318, HDL 24. Chest xray without acute cardiopulmonary process.  Risk factors include diabetes, obesity, HTN hypertriglyceridemia.. Evaluated by cardiology and underwent  lexiscan 10/19/12 that was negative.   Diabetes mellitus, type 2. A1c6.6 On metformin only. Recommend close OP follow up for optimal glycemic  control  Hypertriglyceridemia: Fenobibrate. OP trending with PCP   Obesity, unspecified: BMI 31.9. Nutritional consult.   Thrombocytopenia: mild. No s/sx bleeding. Will need OP follow up.      Procedures: lexiscan 10/19/12 Consultations:  cardiology  Discharge Exam: Filed Vitals:   10/19/12 0502  BP: 133/82  Pulse: 78  Temp: 97.3 F (36.3 C)  Resp: 20    General: sitting on side of bed. NAD Cardiovascular: RRR No MGR no LE edema Respiratory: normal effort BS clear to ausculation bilaterally no wheeze  Discharge Instructions      Discharge Orders   Future Orders Complete By Expires   Call MD for:  difficulty breathing, headache or visual disturbances  As directed    Call MD for:  persistant nausea and vomiting  As directed    Diet - low sodium heart healthy  As directed    Discharge instructions  As directed    Comments:     Take medication as directed Follow up with PCP in 1 week for evaluation of symptoms Follow up with [facility] in 2-3 weeks   Increase activity slowly  As directed        Medication List         acetaminophen 500 MG tablet  Commonly known as:  TYLENOL  Take 500 mg by mouth every 6 (six) hours as needed for pain.     alprazolam 2 MG tablet  Commonly known as:  XANAX  Take 2 mg by mouth 3 (three) times daily as needed for sleep.     cyclobenzaprine 10 MG tablet  Commonly known as:  FLEXERIL  Take 10 mg by mouth 3 (three) times daily as needed for muscle spasms.     fenofibrate 54 MG tablet  Take 1 tablet (54 mg total) by mouth daily at 6 PM.     metFORMIN 500 MG tablet  Commonly known as:  GLUCOPHAGE  Take 500 mg by mouth 2 (two) times daily with a meal.     Oxycodone HCl 20 MG Tabs  Take 20 mg by mouth 2 (two) times daily.     oxyCODONE-acetaminophen 10-325 MG per tablet  Commonly known as:  PERCOCET  Take 1-2 tablets by mouth every 4 (four) hours as needed for pain.     pregabalin 300 MG capsule  Commonly known  as:  LYRICA  Take 300 mg by mouth 2 (two) times daily.       Allergies  Allergen Reactions  . Neurontin [Gabapentin] Other (See Comments)    Causes seizures   Follow-up Information   Follow up with Kirk Ruths, MD. Schedule an appointment as soon as possible for a visit in 1 week. (evaluation of symptoms)    Specialty:  Family Medicine   Contact information:   [address] [place] [address] [phone]       Follow up with Joni Reining, NP. Schedule an appointment as soon as possible for a visit in 3 weeks. (follow up on symptoms and echo results)    Specialty:  Nurse Practitioner   Contact information:   [address] [place] [address] [phone]        The results of significant diagnostics from this hospitalization (including imaging, microbiology, ancillary and laboratory) are listed below for reference.    Significant Diagnostic Studies: Dg Chest Port 1 View  10/17/2012   *RADIOLOGY REPORT*  Clinical Data: Cough and congestion; chest pressure and shortness of breath.  PORTABLE CHEST - 1 VIEW  Comparison: Chest radiograph performed 07/26/2010, and CT of the chest performed 12/03/2010  Findings: The lungs are well-aerated.  There is elevation of the right hemidiaphragm.  There is no evidence of focal opacification, pleural effusion or pneumothorax.  The cardiomediastinal silhouette is within normal limits.  No acute osseous abnormalities are seen.  IMPRESSION: No acute cardiopulmonary process seen.  Stable elevation of the right hemidiaphragm.   Original Report Authenticated By: Tonia Ghent, M.D.    Microbiology: Recent Results (from the past 240 hour(s))  MRSA PCR SCREENING     Status: Abnormal   Collection Time    10/18/12  2:24 AM      Result Value Range Status   MRSA by PCR POSITIVE (*) NEGATIVE Final   Comment:            The GeneXpert MRSA Assay (FDA     approved for NASAL specimens     only), is one  component of a     comprehensive MRSA colonization     surveillance program. It is not     intended to diagnose MRSA     infection nor to guide or     monitor treatment for     MRSA infections.     RESULT CALLED TO, READ BACK BY AND VERIFIED WITH:      ROGERS,L @ 0413 ON 10/18/12 BY WOODIE,J     Labs: Basic Metabolic Panel:  Recent Labs Lab 10/17/12 2222 10/18/12 0239 10/18/12 0241  NA 137  --  138  K 4.0  --  4.2  CL 99  --  101  CO2 26  --  27  GLUCOSE 193*  --  155*  BUN 13  --  13  CREATININE 0.66  --  0.70  CALCIUM 9.6  --  9.1  MG  --  2.1  --    Liver Function Tests: No results found for this basename: AST, ALT, ALKPHOS, BILITOT, PROT, ALBUMIN,  in the last 168 hours No results found for this basename: LIPASE, AMYLASE,  in the last 168 hours No results found for this basename: AMMONIA,  in the last 168 hours CBC:  Recent Labs Lab 10/17/12 2222 10/18/12 0241 10/19/12 0459  WBC 6.1 6.4 7.2  NEUTROABS 3.9  --   --   HGB 14.0 13.4 15.0  HCT 41.1 39.5 44.4  MCV 90.5 90.6 91.4  PLT 139* 125* 148*   Cardiac Enzymes:  Recent Labs Lab 10/17/12 2222 10/18/12 0239 10/18/12 0756 10/18/12 1409  TROPONINI <0.30 <0.30 <0.30 <0.30   BNP: BNP (last 3 results) No results found for this basename: PROBNP,  in the last 8760 hours CBG:  Recent Labs Lab 10/18/12 1140 10/18/12 1634 10/18/12 2111 10/19/12 0726 10/19/12 1142  GLUCAP 141* 132* 111* 101* 98       Signed:  BLACK,KAREN M  [facility] 10/19/2012, 3:15 PM   Patient seen and examined; agree with above plan of care Hudsyn Champine N

## 2012-10-19 NOTE — Progress Notes (Signed)
Patient ID: [name], male   DOB: 10-Jun-1950, 62 y.o.   MRN: [id number]      Primary cardiologist:  Subjective:   No chest pain overnight.    Objective:   Temp:  [97.3 F (36.3 C)-98.1 F (36.7 C)] 97.3 F (36.3 C) (10/07 0502) Pulse Rate:  [78-86] 78 (10/07 0502) Resp:  [20] 20 (10/07 0502) BP: (120-133)/(67-82) 133/82 mmHg (10/07 0502) SpO2:  [95 %-100 %] 100 % (10/07 0502) Weight:  [261 lb 11 oz (118.7 kg)] 261 lb 11 oz (118.7 kg) (10/07 0502) Last BM Date: 10/17/12  Filed Weights   10/18/12 0032 10/18/12 0513 10/19/12 0502  Weight: 268 lb 11.9 oz (121.9 kg) 268 lb 11.9 oz (121.9 kg) 261 lb 11 oz (118.7 kg)    Intake/Output Summary (Last 24 hours) at 10/19/12 1050 Last data filed at 10/19/12 0500  Gross per 24 hour  Intake 1338.33 ml  Output      0 ml  Net 1338.33 ml    Exam:.  Neck: Supple, no elevated JVP or carotid bruits, no thyromegaly.  Lungs: Some scattered bibasilar crackles. No wheezes or coughing  Cardiac: Regular rate and rhythm, no S3 or significant systolic murmur, no pericardial rub.  Abdomen: Distended nontender, no hepatomegaly, bowel sounds present, no guarding or rebound.  Extremities: No pitting edema, distal pulses 2+.  Skin: Warm and dry.  Neuropsychiatric: Alert and oriented x3, affect grossly appropriate.   Lab Results:  Basic Metabolic Panel:  Recent Labs Lab 10/17/12 2222 10/18/12 0239 10/18/12 0241  NA 137  --  138  K 4.0  --  4.2  CL 99  --  101  CO2 26  --  27  GLUCOSE 193*  --  155*  BUN 13  --  13  CREATININE 0.66  --  0.70  CALCIUM 9.6  --  9.1  MG  --  2.1  --     Liver Function Tests: No results found for this basename: AST, ALT, ALKPHOS, BILITOT, PROT, ALBUMIN,  in the last 168 hours  CBC:  Recent Labs Lab 10/17/12 2222 10/18/12 0241 10/19/12 0459  WBC 6.1 6.4 7.2  HGB 14.0 13.4 15.0  HCT 41.1 39.5 44.4  MCV 90.5 90.6 91.4  PLT 139* 125* 148*    Cardiac Enzymes:  Recent Labs Lab 10/18/12 0239  10/18/12 0756 10/18/12 1409  TROPONINI <0.30 <0.30 <0.30    BNP: No results found for this basename: PROBNP,  in the last 8760 hours  Coagulation: No results found for this basename: INR,  in the last 168 hours     Medications:   Scheduled Medications: . aspirin EC  81 mg Oral Daily  . Chlorhexidine Gluconate Cloth  6 each Topical Q0600  . enoxaparin (LOVENOX) injection  60 mg Subcutaneous Q24H  . insulin aspart  0-5 Units Subcutaneous QHS  . insulin aspart  0-9 Units Subcutaneous TID WC  . metFORMIN  500 mg Oral BID WC  . mupirocin ointment  1 application Nasal BID  . pregabalin  300 mg Oral BID  . regadenoson  0.4 mg Intravenous Once  . sodium chloride  3 mL Intravenous Q12H     Infusions: . 0.9 % NaCl with KCl 20 mEq / L 50 mL/hr at 10/18/12 2129     PRN Medications:  acetaminophen, alprazolam, cyclobenzaprine, HYDROmorphone (DILAUDID) injection, nitroGLYCERIN, ondansetron (ZOFRAN) IV, oxyCODONE, technetium sestamibi generic      Assessment/Plan    1. Chest Pain - no evidence of ACS. Patient  with multiple CAD risk factors, will have lexican MPI today. Will follow up results.  - follow up echo results.        Dina Rich, M.D., F.A.C.C.

## 2012-10-19 NOTE — Progress Notes (Signed)
PT GIVEN RX AND DISCHARGE INSTRUCTIONS. IV IS OUT. DENIES AND CHEST PAIN OR DISCOMFORT. AWAITING RIDE HOME.

## 2012-10-19 NOTE — Progress Notes (Signed)
Stress Lab Nurses Notes - [name] 10/19/2012 Reason for doing test: Chest Pain Type of test: Marlane Hatcher / Inpatient Room 315 Nurse performing test: Parke Poisson, RN Nuclear Medicine Tech: Lyndel Pleasure Echo Tech: Not Applicable MD performing test: Dr. Wyline Mood Family MD: Dr. Regino Schultze Test explained and consent signed: yes IV started: 18g jelco, IV in progress from floor and No redness or edema Symptoms: "had Funny Feeling " all over. Treatment/Intervention: None Reason test stopped: protocol completed After recovery IV was: Left intact (rate of 50) and No redness or edema Patient to return to Nuc. Med at : 11:45 Patient discharged: Transported back to room 315 via W/C Patient's Condition upon discharge was: stable Comments: During test BP 156/84  & HR 109.  Recovery BP 159/84 & HR 92. Symptoms resolved in recovery. [name] T

## 2012-10-19 NOTE — Progress Notes (Signed)
*  PRELIMINARY RESULTS* Echocardiogram 2D Echocardiogram has been performed on 10/18/2012.   [name] 10/19/2012, 8:05 AM

## 2012-11-01 ENCOUNTER — Encounter: Payer: Self-pay | Admitting: Adult Health

## 2012-11-01 ENCOUNTER — Ambulatory Visit (INDEPENDENT_AMBULATORY_CARE_PROVIDER_SITE_OTHER): Payer: 59 | Admitting: Adult Health

## 2012-11-01 VITALS — BP 123/75 | HR 82 | Ht 76.0 in | Wt 264.0 lb

## 2012-11-01 DIAGNOSIS — I10 Essential (primary) hypertension: Secondary | ICD-10-CM

## 2012-11-01 DIAGNOSIS — E785 Hyperlipidemia, unspecified: Secondary | ICD-10-CM

## 2012-11-01 DIAGNOSIS — E669 Obesity, unspecified: Secondary | ICD-10-CM

## 2012-11-01 DIAGNOSIS — R079 Chest pain, unspecified: Secondary | ICD-10-CM

## 2012-11-01 MED ORDER — FENOFIBRATE 54 MG PO TABS: 54.0000 mg | ORAL_TABLET | Freq: Every day | ORAL | Status: DC

## 2012-11-01 NOTE — Patient Instructions (Signed)
Your physician recommends that you schedule a follow-up appointment in: AS NEEDED  

## 2012-11-01 NOTE — Progress Notes (Signed)
Name: [name]    DOB: [birth year]  Age: 62 y.o.  MR#: [id number]       PCP:  Kirk Ruths, MD      Insurance: Payor: Cleatrice Burke / Plan: Advertising copywriter / Product Type: *No Product type* /   CC:    Chief Complaint  Patient presents with  . Chest Pain  . Hypertension    VS Filed Vitals:   11/01/12 1402  BP: 123/75  Pulse: 82  Height: 6\' 4"  (1.93 m)  Weight: 264 lb (119.75 kg)    Weights Current Weight  11/01/12 264 lb (119.75 kg)  10/19/12 261 lb 11 oz (118.7 kg)    Blood Pressure  BP Readings from Last 3 Encounters:  11/01/12 123/75  10/19/12 133/82     Admit date:  (Not on file) Last encounter with RMR:  Visit date not found   Allergy Neurontin  Current Outpatient Prescriptions  Medication Sig Dispense Refill  . acetaminophen (TYLENOL) 500 MG tablet Take 500 mg by mouth every 6 (six) hours as needed for pain.      [name] alprazolam (XANAX) 2 MG tablet Take 2 mg by mouth 3 (three) times daily as needed for sleep.      . cholecalciferol (VITAMIN D) 1000 UNITS tablet Take 1,000 Units by mouth daily.      . cyclobenzaprine (FLEXERIL) 10 MG tablet Take 10 mg by mouth 3 (three) times daily as needed for muscle spasms.      . DULoxetine (CYMBALTA) 30 MG capsule Take 30 mg by mouth daily.      . fenofibrate 54 MG tablet Take 1 tablet (54 mg total) by mouth daily at 6 PM.  30 tablet  0  . metFORMIN (GLUCOPHAGE) 500 MG tablet Take 500 mg by mouth 2 (two) times daily with a meal.      . Oxycodone HCl 20 MG TABS Take 20 mg by mouth 2 (two) times daily.      [name] oxyCODONE-acetaminophen (PERCOCET) 10-325 MG per tablet Take 1-2 tablets by mouth every 4 (four) hours as needed for pain.       . pregabalin (LYRICA) 150 MG capsule Take 150 mg by mouth. Take 2 tablets twice a day.       No current facility-administered medications for this visit.    Discontinued Meds:    Medications Discontinued During This Encounter  Medication Reason  . pregabalin (LYRICA) 300 MG capsule  Error    Patient Active Problem List   Diagnosis Date Noted  . Chest pain 10/18/2012  . Diabetes mellitus, type 2 10/18/2012  . Other and unspecified hyperlipidemia 10/18/2012  . Obesity, unspecified 10/18/2012  . Thrombocytopenia, unspecified 10/18/2012    LABS    Component Value Date/Time   NA 138 10/18/2012 0241   NA 137 10/17/2012 2222   NA 138 05/03/2010 0523   K 4.2 10/18/2012 0241   K 4.0 10/17/2012 2222   K 3.6 05/03/2010 0523   CL 101 10/18/2012 0241   CL 99 10/17/2012 2222   CL 100 05/03/2010 0523   CO2 27 10/18/2012 0241   CO2 26 10/17/2012 2222   CO2 29 05/03/2010 0523   GLUCOSE 155* 10/18/2012 0241   GLUCOSE 193* 10/17/2012 2222   GLUCOSE 92 05/03/2010 0523   BUN 13 10/18/2012 0241   BUN 13 10/17/2012 2222   BUN 7 05/03/2010 0523   CREATININE 0.70 10/18/2012 0241   CREATININE 0.66 10/17/2012 2222   CREATININE 0.78 05/03/2010 0523   CALCIUM  9.1 10/18/2012 0241   CALCIUM 9.6 10/17/2012 2222   CALCIUM 8.5 05/03/2010 0523   GFRNONAA >90 10/18/2012 0241   GFRNONAA >90 10/17/2012 2222   GFRNONAA >60 05/03/2010 0523   GFRAA >90 10/18/2012 0241   GFRAA >90 10/17/2012 2222   GFRAA  Value: >60        The eGFR has been calculated using the MDRD equation. This calculation has not been validated in all clinical situations. eGFR's persistently <60 mL/min signify possible Chronic Kidney Disease. 05/03/2010 0523   CMP     Component Value Date/Time   NA 138 10/18/2012 0241   K 4.2 10/18/2012 0241   CL 101 10/18/2012 0241   CO2 27 10/18/2012 0241   GLUCOSE 155* 10/18/2012 0241   BUN 13 10/18/2012 0241   CREATININE 0.70 10/18/2012 0241   CALCIUM 9.1 10/18/2012 0241   PROT 7.1 04/30/2010 0406   ALBUMIN 2.7* 04/30/2010 0406   AST 31 04/30/2010 0406   ALT 30 04/30/2010 0406   ALKPHOS 75 04/30/2010 0406   BILITOT 0.9 04/30/2010 0406   GFRNONAA >90 10/18/2012 0241   GFRAA >90 10/18/2012 0241       Component Value Date/Time   WBC 7.2 10/19/2012 0459   WBC 6.4 10/18/2012 0241   WBC 6.1 10/17/2012 2222    HGB 15.0 10/19/2012 0459   HGB 13.4 10/18/2012 0241   HGB 14.0 10/17/2012 2222   HCT 44.4 10/19/2012 0459   HCT 39.5 10/18/2012 0241   HCT 41.1 10/17/2012 2222   MCV 91.4 10/19/2012 0459   MCV 90.6 10/18/2012 0241   MCV 90.5 10/17/2012 2222    Lipid Panel     Component Value Date/Time   CHOL 165 10/18/2012 0924   TRIG 318* 10/18/2012 0924   HDL 24* 10/18/2012 0924   CHOLHDL 6.9 10/18/2012 0924   VLDL 64* 10/18/2012 0924   LDLCALC 77 10/18/2012 0924    ABG    Component Value Date/Time   PHART 7.436 05/01/2010 0520   PCO2ART 39.3 05/01/2010 0520   PO2ART 65.5* 05/01/2010 0520   HCO3 26.0* 05/01/2010 0520   TCO2 23.0 05/01/2010 0520   O2SAT 94.2 05/01/2010 0520     Lab Results  Component Value Date   TSH 0.481 10/18/2012   BNP (last 3 results) No results found for this basename: PROBNP,  in the last 8760 hours Cardiac Panel (last 3 results) No results found for this basename: CKTOTAL, CKMB, TROPONINI, RELINDX,  in the last 72 hours  Iron/TIBC/Ferritin No results found for this basename: iron, tibc, ferritin     EKG Orders placed during the hospital encounter of 10/17/12  . EKG 12-LEAD  . EKG 12-LEAD  . EKG 12-LEAD  . EKG 12-LEAD  . EKG     Prior Assessment and Plan Problem List as of 11/01/2012     Endocrine   Diabetes mellitus, type 2     Hematopoietic and Hemostatic   Thrombocytopenia, unspecified     Other   Chest pain   Other and unspecified hyperlipidemia   Obesity, unspecified       Imaging: Nm Myocar Single W/spect W/wall Motion And Ef  10/19/2012   Ordering Physician: Dina Rich  Reading Physician: Dina Rich  Clinical Data:  62 year old male with chest pain  NUCLEAR MEDICINE STRESS MYOVIEW STUDY WITH SPECT AND LEFT VENTRICULAR EJECTION FRACTION  Radionuclide Data: One-day rest/stress protocol performed with 10/30 mCi of Tc-97m sestamibi/cardiolite.  Stress Data: Lexiscan bolus was given in standard fashion. Heart rate  increased from  80 beats per  minute up to 108 beats per minute, and blood pressure increased from  137/93 up to 148/80. The patient tolerated infusion well. No diagnostic ST-segment abnormalities were noted, and there were no arrhythmias.  EKG: Baseline tracing shows normal sinus rhythm  Scintigraphic Data: Analysis of the raw perfusion data finds adequate radiotracer uptake.  Tomographic views obtained using the short axis, vertical long axis, and horizontal long axis planes. A defect in the inferior wall likely due to subdiaphragmatic attenuation. It is present in both the resting and stress images, with increased trace uptake in the stress images. There are no other perfusion defects.  Gated imaging reveals an EDV 109 mL, ESV 50 mL, T I D ratio 1.11, and LVEF 54% of with no wall motion abnormalities.  IMPRESSION:Low risk study for obstructive coronary artery disease. Low risk study for major cardiac events.   Original Report Authenticated By: Dina Rich   Dg Chest Port 1 View  10/17/2012   *RADIOLOGY REPORT*  Clinical Data: Cough and congestion; chest pressure and shortness of breath.  PORTABLE CHEST - 1 VIEW  Comparison: Chest radiograph performed 07/26/2010, and CT of the chest performed 12/03/2010  Findings: The lungs are well-aerated.  There is elevation of the right hemidiaphragm.  There is no evidence of focal opacification, pleural effusion or pneumothorax.  The cardiomediastinal silhouette is within normal limits.  No acute osseous abnormalities are seen.  IMPRESSION: No acute cardiopulmonary process seen.  Stable elevation of the right hemidiaphragm.   Original Report Authenticated By: Tonia Ghent, M.D.

## 2012-11-01 NOTE — Progress Notes (Signed)
    HPI:[name] is a 62 y/o obese patient of Dr.Branch we are following post hospitalization for non-cardiac chest pain, but with CVRF. He had lexiscan stress test that was negative for ischemia on 10/19/2012.  He has been feeling well without complaint of chest pain. He continues to have some DOE and neuralgia pain in his feet.  He admits to snoring and his wife states that he stops breathing.   Allergies  Allergen Reactions  . Neurontin [Gabapentin] Other (See Comments)    Causes seizures    Current Outpatient Prescriptions  Medication Sig Dispense Refill  . acetaminophen (TYLENOL) 500 MG tablet Take 500 mg by mouth every 6 (six) hours as needed for pain.      [name] alprazolam (XANAX) 2 MG tablet Take 2 mg by mouth 3 (three) times daily as needed for sleep.      . cholecalciferol (VITAMIN D) 1000 UNITS tablet Take 1,000 Units by mouth daily.      . cyclobenzaprine (FLEXERIL) 10 MG tablet Take 10 mg by mouth 3 (three) times daily as needed for muscle spasms.      . DULoxetine (CYMBALTA) 30 MG capsule Take 30 mg by mouth daily.      . fenofibrate 54 MG tablet Take 1 tablet (54 mg total) by mouth daily at 6 PM.  30 tablet  0  . metFORMIN (GLUCOPHAGE) 500 MG tablet Take 500 mg by mouth 2 (two) times daily with a meal.      . Oxycodone HCl 20 MG TABS Take 20 mg by mouth 2 (two) times daily.      [name] oxyCODONE-acetaminophen (PERCOCET) 10-325 MG per tablet Take 1-2 tablets by mouth every 4 (four) hours as needed for pain.       . pregabalin (LYRICA) 150 MG capsule Take 150 mg by mouth. Take 2 tablets twice a day.       No current facility-administered medications for this visit.    Past Medical History  Diagnosis Date  . Diabetes mellitus without complication   . Pneumonia   . Chest pain     10/14  . Urethral stricture     s/p dilitation  . Neuropathy     compression neuropathy right hip;s/p replacement  . Fibromyalgia     Past Surgical History  Procedure Laterality Date  . Left hip  replacement      VWU:[id number] of systems complete and found to be negative unless listed above  PHYSICAL EXAM BP 123/75  Pulse 82  Ht 6\' 4"  (1.93 m)  Wt 264 lb (119.75 kg)  BMI 32.15 kg/m2  General: Well developed, obese, well nourished, in no acute distress Head: Eyes PERRLA, No xanthomas.   Normal cephalic and atramatic  Lungs: Clear bilaterally to auscultation and percussion. Heart: HRRR S1 S2, without MRG.  Pulses are 2+ & equal.            No carotid bruit. No JVD.  No abdominal bruits. No femoral bruits. Abdomen: Bowel sounds are positive, abdomen soft and non-tender without masses or                  Hernia's noted. Msk:  Back normal, normal gait. Normal strength and tone for age. Extremities: No clubbing, cyanosis or edema.  DP +1 Neuro: Alert and oriented X 3. Psych:  Good affect, responds appropriately  :  ASSESSMENT AND PLAN

## 2012-11-01 NOTE — Assessment & Plan Note (Signed)
This has resolved. He has CVRF of diabetes and hypertriglyceridemia. Normal stress test and echo in Oct 2014. We will see him on a PRN basis only. I have discussed with him the need to have close follow up with his PCP. If necessary we will be happy to see him again.

## 2012-11-01 NOTE — Assessment & Plan Note (Signed)
I think some of his DOE is related to deconditioning. He may also have OSA. He will follow up with PCP and make decision to have sleep study with them.

## 2012-11-01 NOTE — Assessment & Plan Note (Signed)
He is to continue low cholesterol and low carb diet. He is given 6 month refill on fenofibrate.

## 2013-01-31 ENCOUNTER — Other Ambulatory Visit (HOSPITAL_COMMUNITY): Payer: Self-pay | Admitting: Physician Assistant

## 2013-01-31 DIAGNOSIS — I889 Nonspecific lymphadenitis, unspecified: Secondary | ICD-10-CM

## 2013-01-31 DIAGNOSIS — G589 Mononeuropathy, unspecified: Secondary | ICD-10-CM

## 2013-01-31 DIAGNOSIS — G8929 Other chronic pain: Secondary | ICD-10-CM

## 2013-02-01 ENCOUNTER — Ambulatory Visit (HOSPITAL_COMMUNITY): Payer: 59

## 2013-02-01 ENCOUNTER — Ambulatory Visit (HOSPITAL_COMMUNITY)
Admission: RE | Admit: 2013-02-01 | Discharge: 2013-02-01 | Disposition: A | Discharge status: Home / Self Care | Payer: 59 | Source: Ambulatory Visit | Attending: Physician Assistant | Admitting: Physician Assistant

## 2013-02-01 DIAGNOSIS — I889 Nonspecific lymphadenitis, unspecified: Secondary | ICD-10-CM

## 2013-02-01 DIAGNOSIS — G8929 Other chronic pain: Secondary | ICD-10-CM

## 2013-02-01 DIAGNOSIS — R599 Enlarged lymph nodes, unspecified: Secondary | ICD-10-CM | POA: Insufficient documentation

## 2013-02-01 DIAGNOSIS — G589 Mononeuropathy, unspecified: Secondary | ICD-10-CM

## 2013-03-09 ENCOUNTER — Other Ambulatory Visit (HOSPITAL_COMMUNITY): Payer: Self-pay | Admitting: Physician Assistant

## 2013-03-09 DIAGNOSIS — G8929 Other chronic pain: Secondary | ICD-10-CM

## 2013-03-09 DIAGNOSIS — I881 Chronic lymphadenitis, except mesenteric: Secondary | ICD-10-CM

## 2013-03-16 ENCOUNTER — Other Ambulatory Visit (HOSPITAL_COMMUNITY): Payer: Self-pay | Admitting: Physician Assistant

## 2013-03-16 ENCOUNTER — Other Ambulatory Visit (HOSPITAL_COMMUNITY): Payer: Self-pay | Admitting: Family Medicine

## 2013-03-16 ENCOUNTER — Telehealth: Payer: Self-pay | Admitting: Hematology and Oncology

## 2013-03-16 ENCOUNTER — Ambulatory Visit (HOSPITAL_COMMUNITY)
Admission: RE | Admit: 2013-03-16 | Discharge: 2013-03-16 | Disposition: A | Discharge status: Home / Self Care | Payer: 59 | Source: Ambulatory Visit | Attending: Physician Assistant | Admitting: Physician Assistant

## 2013-03-16 DIAGNOSIS — I881 Chronic lymphadenitis, except mesenteric: Secondary | ICD-10-CM | POA: Insufficient documentation

## 2013-03-16 DIAGNOSIS — G8929 Other chronic pain: Secondary | ICD-10-CM

## 2013-03-16 DIAGNOSIS — C099 Malignant neoplasm of tonsil, unspecified: Secondary | ICD-10-CM

## 2013-03-16 DIAGNOSIS — M5030 Other cervical disc degeneration, unspecified cervical region: Secondary | ICD-10-CM | POA: Insufficient documentation

## 2013-03-16 DIAGNOSIS — R220 Localized swelling, mass and lump, head: Secondary | ICD-10-CM | POA: Insufficient documentation

## 2013-03-16 DIAGNOSIS — M47812 Spondylosis without myelopathy or radiculopathy, cervical region: Secondary | ICD-10-CM | POA: Insufficient documentation

## 2013-03-16 DIAGNOSIS — R221 Localized swelling, mass and lump, neck: Secondary | ICD-10-CM

## 2013-03-16 LAB — POCT I-STAT CREATININE: Creatinine, Ser: 0.9 mg/dL (ref 0.50–1.35)

## 2013-03-16 MED ORDER — IOHEXOL 300 MG/ML  SOLN
75.0000 mL | Freq: Once | INTRAMUSCULAR | Status: AC | PRN
  Administered 2013-03-16: 75 mL via INTRAVENOUS

## 2013-03-16 NOTE — Telephone Encounter (Signed)
S/W PT 'S WIFE IN REF TO NP APPT. ON 03/22/13@10 :[place] DX-TONSILAR MASS MAILED NP PACKET

## 2013-03-17 ENCOUNTER — Ambulatory Visit (INDEPENDENT_AMBULATORY_CARE_PROVIDER_SITE_OTHER): Payer: 59 | Admitting: Otolaryngology

## 2013-03-17 ENCOUNTER — Other Ambulatory Visit (HOSPITAL_COMMUNITY): Payer: Self-pay | Admitting: Physician Assistant

## 2013-03-17 DIAGNOSIS — C099 Malignant neoplasm of tonsil, unspecified: Secondary | ICD-10-CM

## 2013-03-18 ENCOUNTER — Ambulatory Visit (HOSPITAL_COMMUNITY): Payer: 59

## 2013-03-18 ENCOUNTER — Other Ambulatory Visit (HOSPITAL_COMMUNITY): Payer: Self-pay | Admitting: "Endocrinology

## 2013-03-18 ENCOUNTER — Ambulatory Visit (HOSPITAL_COMMUNITY): Admission: RE | Admit: 2013-03-18 | Payer: 59 | Source: Ambulatory Visit

## 2013-03-18 ENCOUNTER — Telehealth: Payer: Self-pay | Admitting: Hematology and Oncology

## 2013-03-18 DIAGNOSIS — E042 Nontoxic multinodular goiter: Secondary | ICD-10-CM

## 2013-03-18 NOTE — Telephone Encounter (Signed)
C/D 03/18/13 for appt. 03/22/13

## 2013-03-22 ENCOUNTER — Encounter: Payer: Self-pay | Admitting: *Deleted

## 2013-03-22 ENCOUNTER — Encounter (HOSPITAL_COMMUNITY): Payer: Self-pay

## 2013-03-22 ENCOUNTER — Ambulatory Visit (HOSPITAL_COMMUNITY)
Admission: RE | Admit: 2013-03-22 | Discharge: 2013-03-22 | Disposition: A | Discharge status: Home / Self Care | Payer: 59 | Source: Ambulatory Visit | Attending: "Endocrinology | Admitting: "Endocrinology

## 2013-03-22 ENCOUNTER — Ambulatory Visit (HOSPITAL_BASED_OUTPATIENT_CLINIC_OR_DEPARTMENT_OTHER): Payer: 59

## 2013-03-22 ENCOUNTER — Encounter: Payer: Self-pay | Admitting: Hematology and Oncology

## 2013-03-22 ENCOUNTER — Ambulatory Visit (HOSPITAL_BASED_OUTPATIENT_CLINIC_OR_DEPARTMENT_OTHER): Payer: 59 | Admitting: Hematology and Oncology

## 2013-03-22 ENCOUNTER — Other Ambulatory Visit (HOSPITAL_COMMUNITY): Payer: Self-pay | Admitting: "Endocrinology

## 2013-03-22 ENCOUNTER — Telehealth: Payer: Self-pay | Admitting: Hematology and Oncology

## 2013-03-22 ENCOUNTER — Encounter (INDEPENDENT_AMBULATORY_CARE_PROVIDER_SITE_OTHER): Payer: Self-pay

## 2013-03-22 VITALS — BP 116/78 | HR 95 | Temp 98.1°F | Resp 18 | Ht 76.0 in | Wt 256.9 lb

## 2013-03-22 DIAGNOSIS — C099 Malignant neoplasm of tonsil, unspecified: Secondary | ICD-10-CM | POA: Insufficient documentation

## 2013-03-22 DIAGNOSIS — E042 Nontoxic multinodular goiter: Secondary | ICD-10-CM

## 2013-03-22 DIAGNOSIS — E1142 Type 2 diabetes mellitus with diabetic polyneuropathy: Secondary | ICD-10-CM

## 2013-03-22 DIAGNOSIS — R220 Localized swelling, mass and lump, head: Secondary | ICD-10-CM

## 2013-03-22 DIAGNOSIS — E1140 Type 2 diabetes mellitus with diabetic neuropathy, unspecified: Secondary | ICD-10-CM

## 2013-03-22 DIAGNOSIS — R221 Localized swelling, mass and lump, neck: Secondary | ICD-10-CM

## 2013-03-22 DIAGNOSIS — E079 Disorder of thyroid, unspecified: Secondary | ICD-10-CM

## 2013-03-22 MED ORDER — LIDOCAINE HCL (PF) 2 % IJ SOLN
INTRAMUSCULAR | Status: AC
  Administered 2013-03-22: 10 mL
  Filled 2013-03-22: qty 10

## 2013-03-22 MED ORDER — LIDOCAINE HCL (PF) 2 % IJ SOLN
10.0000 mL | Freq: Once | INTRAMUSCULAR | Status: AC
  Administered 2013-03-22: 10 mL

## 2013-03-22 NOTE — Progress Notes (Signed)
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 According to the patient, the first initial presentation was due to palpable lymphadenopathy and a right sided ear infection since December of last year. The patient was placed on 2 different courses of Augmentin with no resolution. He also complained of persistent sore throat. he denies any hearing deficit. He does have mild difficulties with swallowing, mild painful swallowing, changes in the quality of his voice but denies abnormal weight loss. I reviewed his records extensively and summarized as follows:   Tonsil cancer   02/01/2013 Imaging Ultrasound of the neck revealed bilateral lymphadenopathy in the submandibular region   03/16/2013 Imaging CT scan of the neck show large right tonsil mass measured 3.9 cm in maximum dimension as well as bilateral lymphadenopathy, the largest lymph node measures 31 mm. There is also a left thyroid mass measured 44 mm   03/21/2013 Procedure The patient was seen by ENT with laryngoscopy and biopsy. Pathology is pending   MEDICAL HISTORY:  Past Medical History  Diagnosis Date  . Diabetes mellitus without complication   . Pneumonia   . Chest pain     10/14  . Urethral stricture     s/p dilitation  . Neuropathy     compression neuropathy right hip;s/p replacement  . Fibromyalgia   . Cancer     tonsil ca    SURGICAL HISTORY: Past Surgical History  Procedure Laterality Date  . Left hip replacement Right   . Cystoscopy       SOCIAL HISTORY: History   Social History  . Marital Status: Married    Spouse Name: N/A    Number of Children: N/A  . Years of Education: N/A   Occupational History  . Not on file.   Social History Main Topics  . Smoking status: Never Smoker   . Smokeless tobacco: Never Used  . Alcohol Use: No  . Drug Use: No  . Sexual Activity: Not on file   Other Topics Concern  . Not on file   Social History Narrative  . No narrative on file    FAMILY HISTORY: Family History  Problem Relation Age of Onset  . Hyperlipidemia Brother   . Cancer Mother     Deceased with leukemia, had uterine ca    ALLERGIES:  is allergic to neurontin.  MEDICATIONS:  Current Outpatient Prescriptions  Medication Sig Dispense Refill  . acetaminophen (TYLENOL) 500 MG tablet Take 500 mg by mouth every 6 (six) hours as needed for pain.      [name] alprazolam (XANAX) 2 MG tablet Take 2 mg by mouth 3 (three) times daily as needed for sleep.      . Cholecalciferol (VITAMIN D3) 5000 UNITS CAPS Take 5,000 Units by mouth daily.      . citalopram (CELEXA) 20 MG tablet Take 20 mg by mouth daily.      [name] docusate sodium (COLACE) 100 MG capsule Take 100 mg by mouth 2 (two) times daily as needed for mild constipation.      . metFORMIN (GLUCOPHAGE) 500 MG tablet Take 500 mg by  mouth 2 (two) times daily with a meal.      . OxyCODONE (OXYCONTIN) 10 mg T12A 12 hr tablet Take 10 mg by mouth every 12 (twelve) hours.      [name] oxyCODONE-acetaminophen (PERCOCET) 10-325 MG per tablet Take 1-2 tablets by mouth every 4 (four) hours as needed for pain.       . pregabalin (LYRICA) 150 MG capsule Take 150 mg by mouth. Take 2 tablets twice a day.       No current facility-administered medications for this visit.    REVIEW OF SYSTEMS:   Constitutional: Denies fevers, chills or abnormal night sweats Eyes: Denies blurriness of vision, double vision or watery eyes Respiratory: Denies cough, dyspnea or wheezes Cardiovascular: Denies  palpitation, chest discomfort or lower extremity swelling Gastrointestinal:  Denies nausea, heartburn or change in bowel habits Skin: Denies abnormal skin rashes Lymphatics: Denies new lymphadenopathy or easy bruising Neurological: He has severe peripheral neuropathy from his feet to mid thigh area due to 2 diabetes complication. He takes chronic pain medications for this Behavioral/Psych: Mood is stable, no new changes  All other systems were reviewed with the patient and are negative.  PHYSICAL EXAMINATION: ECOG PERFORMANCE STATUS: 1 - Symptomatic but completely ambulatory  Filed Vitals:   03/22/13 1003  BP: 116/78  Pulse: 95  Temp: 98.1 F (36.7 C)  Resp: 18   Filed Weights   03/22/13 1003  Weight: 256 lb 14.4 oz (116.529 kg)    GENERAL:alert, no distress and comfortable. He is morbidly obese SKIN: skin color, texture, turgor are normal, no rashes or significant lesions EYES: normal, conjunctiva are pink and non-injected, sclera clear OROPHARYNX:no exudate, no erythema and lips, buccal mucosa, and tongue normal . Unable to appreciate any tonsil mass on visual inspection due to oropharyngeal crowding. NECK: supple, mild thyroid enlargement. LYMPH: palpable lymphadenopathy in the cervical bilaterally but none in the axillary or inguinal LUNGS: clear to auscultation and percussion with normal breathing effort HEART: regular rate & rhythm and no murmurs and no lower extremity edema ABDOMEN:abdomen soft, non-tender and normal bowel sounds Musculoskeletal:no cyanosis of digits and no clubbing  PSYCH: alert & oriented x 3 with dysarthria. NEURO: no focal motor/sensory deficits  LABORATORY DATA:  I have reviewed the data as listed Lab Results  Component Value Date   WBC 7.2 10/19/2012   HGB 15.0 10/19/2012   HCT 44.4 10/19/2012   MCV 91.4 10/19/2012   PLT 148* 10/19/2012   Lab Results  Component Value Date   NA 138 10/18/2012   K 4.2 10/18/2012   CL 101 10/18/2012   CO2 27  10/18/2012    RADIOGRAPHIC STUDIES: I have personally reviewed the radiological images as listed and agreed with the findings in the report. Ct Soft Tissue Neck W Contrast  03/16/2013   CLINICAL DATA:  Chronic lymphadenitis  EXAM: CT NECK WITH CONTRAST  TECHNIQUE: Multidetector CT imaging of the neck was performed using the standard protocol following the bolus administration of intravenous contrast.  CONTRAST:  77mL OMNIPAQUE IOHEXOL 300 MG/ML  SOLN  COMPARISON:  None.  FINDINGS: Large mass in the right palatine tonsil measures 2.8 x 3.9 cm compatible with carcinoma. This is a solid enhancing mass without significant necrosis.  There is pathologic adenopathy in the neck bilaterally. Right level IIa lymph nodes are pathologic measuring 13 mm, and 10 mm. Right level 2B lymph node is pathologic measuring 31 x 30 mm. Right level 2/3 node is 18 x 27 mm. Pathologic left level 2B  node is 27 x 21 mm.  Left thyroid mass measures 37 x 44 mm. Recommend biopsy. There is also heterogeneit and calcification in the right lobe of the thyroid without a well-defined mass. Thyroid ultrasound would be helpful for further delineation.  Cervical disc degeneration and spondylosis at C5-6 and C6-7. No acute bony changes. Lung apices are clear.  IMPRESSION: Right palatine tonsil mass measuring 2.8 x 3.9 cm compatible with carcinoma. Malignant adenopathy in the neck bilaterally.  Left thyroid mass measures 37 x 44 mm. Thyroid ultrasound and fine needle aspiration biopsy recommended to exclude neoplasm of the thyroid.  I discussed the findings by telephone with Rowan Blase PA   Electronically Signed   By: Franchot Gallo M.D.   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 Incidentally, he was also noted to have abnormal thyroid mass, with appointment to see endocrinology and biopsy pending. Stage of the disease is to be determined, a PET/CT scan has been ordered.   Prognosis would depend on the results for the PET/CT scan, to be discussed and reviewed in the next visit.   Treatment options would include chemotherapy only, radiation only or chemotherapy in combination with radiation therapy.    In preparation for treatment, the patient will need the following tests or referrals, to be arranged #1 Referral to Radiation Oncology for consultation.  #2 Referral to dentist for full dental evaluation and possible dental extraction  #3 PET/CT scan (ordered and pending) #4 Referral for feeding tube placement.  #5 Infusaport placement #6 Referral to Speech Pathologist  #7 Referral to Nutritionist  #8 Referral to Social Worker  #9 Referral to chemotherapy class to learn about practical tips while on treatment.   I will get his case presented at the next ENT tumor board. I'll see him back in 2 weeks for further assessment  Orders Placed This Encounter  Procedures  . Amb Referral to Nutrition and Diabetic E    Referral Priority:  Routine    Referral Type:  Consultation    Referral Reason:  Specialty Services Required    Referred to Provider:  Karie Mainland, RD    Number of Visits Requested:  1  . Ambulatory referral to Radiation Oncology    Referral Priority:  Routine    Referral Type:  Consultation    Referral Reason:  Specialty Services Required    Requested Specialty:  Radiation Oncology    Number of Visits Requested:  1  . Ambulatory referral to Speech Therapy    Referral Priority:  Routine    Referral Type:  Speech Therapy    Referral Reason:  Specialty Services Required    Requested Specialty:  Speech Pathology    Number of Visits  Requested:  1  . Ambulatory referral to Dentistry    Referral Priority:  Routine    Referral Type:  Consultation    Referral Reason:  Specialty Services Required    Requested Specialty:  Dental General Practice    Number of Visits Requested:  1  . Ambulatory referral to General Surgery    Referral Priority:  Routine    Referral Type:  Surgical    Referral Reason:  Specialty Services Required    Referred to Provider:  Ralene Ok, MD    Requested Specialty:  General Surgery    Number of Visits Requested:  1  . Ambulatory referral to Social Work    Referral Priority:  Routine    Referral Type:  Consultation    Referral Reason:  Specialty Services Required    Number of Visits Requested:  1    All questions were answered. The patient knows to call the clinic with any problems, questions or concerns. I spent 40 minutes counseling the patient face to face. The total time spent in the appointment was 55 minutes and more than 50% was on counseling.     [facility], [place], MD 03/22/2013 12:56 PM

## 2013-03-22 NOTE — Discharge Instructions (Signed)
Thyroid Biopsy °The thyroid gland is a butterfly-shaped gland situated in the front of the neck. It produces hormones which affect metabolism, growth and development, and body temperature. A thyroid biopsy is a procedure in which small samples of tissue or fluid are removed from the thyroid gland or mass and examined under a microscope. This test is done to determine the cause of thyroid problems, such as infection, cancer, or other thyroid problems. °There are 2 ways to obtain samples: °1. Fine needle biopsy. Samples are removed using a thin needle inserted through the skin and into the thyroid gland or mass. °2. Open biopsy. Samples are removed after a cut (incision) is made through the skin. °LET YOUR CAREGIVER KNOW ABOUT:  °· Allergies. °· Medications taken including herbs, eye drops, over-the-counter medications, and creams. °· Use of steroids (by mouth or creams). °· Previous problems with anesthetics or numbing medicine. °· Possibility of pregnancy, if this applies. °· History of blood clots (thrombophlebitis). °· History of bleeding or blood problems. °· Previous surgery. °· Other health problems. °RISKS AND COMPLICATIONS °· Bleeding from the site. The risk of bleeding is higher if you have a bleeding disorder or are taking any blood thinning medications (anticoagulants). °· Infection. °· Injury to structures near the thyroid gland. °BEFORE THE PROCEDURE  °This is a procedure that can be done as an outpatient. Confirm the time that you need to arrive for your procedure. Confirm whether there is a need to fast or withhold any medications. A blood sample may be done to determine your blood clotting time. Medicine may be given to help you relax (sedative). °PROCEDURE °Fine needle biopsy. °You will be awake during the procedure. You may be asked to lie on your back with your head tipped backward to extend your neck. Let your caregiver know if you cannot tolerate the positioning. An area on your neck will be  cleansed. A needle is inserted through the skin of your neck. You may feel a mild discomfort during this procedure. You may be asked to avoid coughing, talking, swallowing, or making sounds during some portions of the procedure. The needle is withdrawn once tissue or fluid samples have been removed. Pressure may be applied to the neck to reduce swelling and ensure that bleeding has stopped. The samples will be sent for examination.  °Open biopsy. °You will be given general anesthesia. You will be asleep during the procedure. An incision is made in your neck. A sample of thyroid tissue or the mass is removed. The tissue sample or mass will be sent for examination. The sample or mass may be examined during the biopsy. If the sample or mass contains cancer cells, some or all of the thyroid gland may be removed. The incision is closed with stitches. °AFTER THE PROCEDURE  °Your recovery will be assessed and monitored. If there are no problems, as an outpatient, you should be able to go home shortly after the procedure. °If you had a fine needle biopsy: °· You may have soreness at the biopsy site for 1 to 2 days. °If you had an open biopsy:  °· You may have soreness at the biopsy site for 3 to 4 days. °· You may have a hoarse voice or sore throat for 1 to 2 days. °Obtaining the Test Results °It is your responsibility to obtain your test results. Do not assume everything is normal if you have not heard from your caregiver or the medical facility. It is important for you to follow up   on all of your test results. HOME CARE INSTRUCTIONS   Keeping your head raised on a pillow when you are lying down may ease biopsy site discomfort.  Supporting the back of your head and neck with both hands as you sit up from a lying position may ease biopsy site discomfort.  Only take over-the-counter or prescription medicines for pain, discomfort, or fever as directed by your caregiver.  Throat lozenges or gargling with warm salt  water may help to soothe a sore throat. SEEK IMMEDIATE MEDICAL CARE IF:   You have severe bleeding from the biopsy site.  You have difficulty swallowing.  You have a fever.  You have increased pain, swelling, redness, or warmth at the biopsy site.  You notice pus coming from the biopsy site.  You have swollen glands (lymph nodes) in your neck. Document Released: 10/27/2006 Document Revised: 04/26/2012 Document Reviewed: 03/29/2008 [facility] Patient Information 2014 [place], [place].

## 2013-03-22 NOTE — Progress Notes (Signed)
Checked in new pt with no financial concerns. °

## 2013-03-22 NOTE — Telephone Encounter (Signed)
Gave pt appt for MD, radiation, surgeon,chemo class, speech and nutrition for March, talked to Dr. Enrique Sack he is aware of the consult, called Lauren left message for social services consult, all appts given to patient today

## 2013-03-22 NOTE — Progress Notes (Signed)
Biopsy complete no signs of distress

## 2013-03-23 ENCOUNTER — Encounter: Payer: Self-pay | Admitting: *Deleted

## 2013-03-23 NOTE — Progress Notes (Signed)
Discussed patient during weekly Multidisciplinary Support Team meeting [facility] [place], RD; Polo Riley, LCSW; Epifania Gore, Chaplain; myself).  Gayleen Orem, RN, BSN, [facility] Navigator [phone]

## 2013-03-24 NOTE — Progress Notes (Signed)
Met with patient and his wife durin initial consult with Dr. Alvy Bimler.  Introduced myself as Designer, television/film set, explained my role as a member of the Care Team, provided contact information, encouraged them to contact me with questions/concerns as treatments/procedures begin.  They verbalized understanding.    Familiarized them with locations Dr. Ritta Slot office and [facility] for future appts.  They expressed appreciation for my availability today and in the future.  Initiating navigation as L1 patient (new) with this encounter.  Gayleen Orem, RN, BSN, [facility] Navigator [phone]

## 2013-03-25 ENCOUNTER — Ambulatory Visit (HOSPITAL_COMMUNITY)
Admission: RE | Admit: 2013-03-25 | Discharge: 2013-03-25 | Disposition: A | Discharge status: Home / Self Care | Payer: 59 | Source: Ambulatory Visit | Attending: Family Medicine | Admitting: Family Medicine

## 2013-03-25 DIAGNOSIS — C770 Secondary and unspecified malignant neoplasm of lymph nodes of head, face and neck: Secondary | ICD-10-CM | POA: Insufficient documentation

## 2013-03-25 DIAGNOSIS — C099 Malignant neoplasm of tonsil, unspecified: Secondary | ICD-10-CM | POA: Insufficient documentation

## 2013-03-25 DIAGNOSIS — K7689 Other specified diseases of liver: Secondary | ICD-10-CM | POA: Insufficient documentation

## 2013-03-25 DIAGNOSIS — E049 Nontoxic goiter, unspecified: Secondary | ICD-10-CM | POA: Insufficient documentation

## 2013-03-25 DIAGNOSIS — K5730 Diverticulosis of large intestine without perforation or abscess without bleeding: Secondary | ICD-10-CM | POA: Insufficient documentation

## 2013-03-25 LAB — GLUCOSE, CAPILLARY: Glucose-Capillary: 173 mg/dL — ABNORMAL HIGH (ref 70–99)

## 2013-03-25 MED ORDER — FLUDEOXYGLUCOSE F - 18 (FDG) INJECTION
13.9000 | Freq: Once | INTRAVENOUS | Status: AC | PRN
  Administered 2013-03-25: 13.9 via INTRAVENOUS

## 2013-03-28 ENCOUNTER — Encounter: Payer: 59 | Admitting: Nutrition

## 2013-03-28 ENCOUNTER — Ambulatory Visit: Payer: 59

## 2013-03-28 ENCOUNTER — Encounter: Payer: Self-pay | Admitting: Hematology and Oncology

## 2013-03-28 ENCOUNTER — Telehealth: Payer: Self-pay | Admitting: *Deleted

## 2013-03-28 ENCOUNTER — Other Ambulatory Visit: Payer: 59

## 2013-03-28 ENCOUNTER — Other Ambulatory Visit: Payer: Self-pay | Admitting: Hematology and Oncology

## 2013-03-28 NOTE — Progress Notes (Addendum)
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 The patient was placed on 2 different courses of Augmentin with no resolution. He also complained of persistent sore throat. He denies any hearing deficit. He does have mild difficulties with swallowing, mild painful swallowing, changes in the quality of his voice  03/16/13 CT scan of the neck show large right tonsil mass measured 3.9 cm in maximum dimension as well as bilateral lymphadenopathy, the largest lymph node measures 31 mm. 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  Ralene Ok  Tobacco/Marijuana/Snuff/ETOH use: No, no alcohol, no illicit drugs  Past/Anticipated interventions by otolaryngology, if any: Fine Needle Aspiration of Left Thyroid Nodule  Past/Anticipated interventions by medical oncology, if FSF:[id number]  Referrals yet, to any of the following?  Social Work?   Dentistry? Dr. Enrique Sack 03/31/13  Swallowing therapy? Almyra Deforest 04/04/13  Nutrition? Ernestene Kiel - 03/31/13  Med/Onc? Seen on 03/22/13.  Next visit 04/06/13  PEG placement? no  SAFETY ISSUES:  Prior radiation? No  Pacemaker/ICD? NO  Possible current pregnancy? N/A  Is the patient on methotrexate? NO  c/o pain a 9/10 scale right side of face took 2 perccet now from home 1430 Current Complaints / other details:   Married,  2 children, Mother deceased, leukemia and uterine cancer,  maternal  1st cousin male  brain cancer, living,

## 2013-03-28 NOTE — Telephone Encounter (Signed)
Wife states pt did not sleep well at all last night, he was up half the night w/ swollen, red eyes. She applied warm compresses and visine eye drops.  States it has cleared up some but pt is too tired to make it for his scheduled appts today.  He is scheduled for chemo education, nutrition and speech therapy.  She asks for these appts can be rescheduled.  She also states she will call pt's PCP if his eyes don't get better.  She is aware of his appt tomorrow to see Dr. Isidore Moos and plans to keep it.   POF sent to scheduling to r/s above appts to w/i one week.

## 2013-03-28 NOTE — Progress Notes (Signed)
Radiation Oncology         [phone] ________________________________  Initial Outpatient Consultation  Name: [name] MRN: [id number]  Date: 03/29/2013  DOB: Feb 09, 1950  TZ:GYFV, BENJAMIN, PA-C  Heath Lark, MD Leta Baptist MD  REFERRING PHYSICIAN: Heath Lark, MD  DIAGNOSIS: Squamous Cell Carcinoma of the tonsil, p16+, T3N2cM0  HISTORY OF PRESENT ILLNESS::[name] is a 63 y.o. male who presented with voice changes, palpable lymphadenopathy and a right sided ear infection in  December 2014.  The patient was placed on 2 different courses of Augmentin with no resolution. He also complained of persistent odynophagia.  He does have mild difficulties with swallowing, especially solids,  but denies unintentional weight loss.  On 02-01-13 Ultrasound of the neck revealed bilateral lymphadenopathy in the submandibular region  On 03-16-13 CT scan of the neck show large right tonsil mass measured 3.9 cm in maximum dimension as well as bilateral lymphadenopathy, the largest lymph node measures 31 mm. There is also a left thyroid mass measured 44 mm   On 03-21-13 The patient was seen by ENT with laryngoscopy and tonsil biopsy. Pathology was reviewed at tumor board on 3-18 and reportedly p16+ Squamous Cell Carcinoma  On 03-22-13 Thyroid Bx revealed  Findings consistent with goiter, as reviewed at tumor board on 3-18  On 03-25-13 PET revealed a large 4.1 x 3.4 cm hypermetabolic soft tissue mass in the region of the right palatine tonsil with bilateral cervical hypermetabolic lymphadenopathy  (largest node >4cm in CC dimension) indicative of metastatic disease; no signs of distal metastatic disease in the chest, abdomen or pelvis. The appearance of the thyroid suggests a multinodular goiter.  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    PAST SURGICAL HISTORY: Past Surgical History  Procedure Laterality Date  . Left hip replacement Right   . Cystoscopy      FAMILY HISTORY: family history includes Cancer in his cousin and mother; Hyperlipidemia in his brother.  SOCIAL HISTORY:  reports that he has never smoked. He has never used smokeless tobacco. He reports that he does not drink alcohol or use illicit drugs.  ALLERGIES: Neurontin  MEDICATIONS:  Current Outpatient Prescriptions  Medication Sig Dispense Refill  . acetaminophen (TYLENOL) 500 MG tablet Take 500 mg by mouth every 6 (six) hours as needed for pain.      [name] alprazolam (XANAX) 2 MG tablet Take 2 mg by mouth 3 (three) times daily as needed for sleep.      . Cholecalciferol (VITAMIN D3) 5000 UNITS CAPS Take 5,000 Units by mouth daily.      . citalopram (CELEXA) 20 MG tablet Take 20 mg by mouth daily.      [name] docusate sodium (COLACE) 100 MG capsule Take 100 mg by mouth 2 (two) times daily as needed for mild constipation.      . metFORMIN (GLUCOPHAGE) 500 MG tablet Take 500 mg by mouth 2 (two) times daily with a meal.      . OxyCODONE (OXYCONTIN) 10 mg T12A 12 hr tablet Take 10 mg by mouth every 12 (twelve) hours.      [name] oxyCODONE-acetaminophen (PERCOCET) 10-325 MG per tablet Take 1-2 tablets by mouth every 4 (four) hours  as needed for pain.       . pregabalin (LYRICA) 150 MG capsule Take 150 mg by mouth. Take 2 tablets twice a day.       No current facility-administered medications for this encounter.    REVIEW OF SYSTEMS:  Notable for that above.   PHYSICAL EXAM:  height is 6\' 4"  (1.93 m) and weight is 253 lb 6.4 oz (114.941 kg). His oral temperature is 98.2 F (36.8 C). His blood pressure is 141/89 and his pulse is 98. His respiration is 20 and oxygen saturation is 94%.   General: Alert and oriented, in no  acute distress, nasal quality to voice HEENT: Head is normocephalic. Pupils are equally round and reactive to light. Extraocular movements are intact. Cerumen occludes view of tympanic membranes. Oropharynx - large tonsil mass on R, crossing midline, approx 4cm, soft palate extension. Neck: bilateral cervical adenopathy in level II on R and L, as well at a node at Ib on right.  Largest node is approx 3 cm in right level II region.  No supraclavicular lymphadenopathy. Heart: Regular in rate and rhythm   Chest: Clear to auscultation bilaterally  Abdomen: Soft, nontender, nondistended, with no rigidity or guarding. Extremities: No cyanosis or edema. Lymphatics:as above Musculoskeletal: symmetric strength and muscle tone throughout. Neurologic: Cranial nerves II through XII are grossly intact. No obvious focalities. Speech is fluent. Coordination is intact. Psychiatric: Judgment and insight are intact. Affect is appropriate.   ECOG = 2  0 - Asymptomatic (Fully active, able to carry on all predisease activities without restriction)  1 - Symptomatic but completely ambulatory (Restricted in physically strenuous activity but ambulatory and able to carry out work of a light or sedentary nature. For example, light housework, office work)  2 - Symptomatic, <50% in bed during the day (Ambulatory and capable of all self care but unable to carry out any work activities. Up and about more than 50% of waking hours)  3 - Symptomatic, >50% in bed, but not bedbound (Capable of only limited self-care, confined to bed or chair 50% or more of waking hours)  4 - Bedbound (Completely disabled. Cannot carry on any self-care. Totally confined to bed or chair)  5 - Death   Eustace Pen MM, Creech RH, Tormey DC, et al. [phone]). "Toxicity and response criteria of the [facility]". [place] Oncol. 5 (6): 649-55   LABORATORY DATA:  Lab Results  Component Value Date   WBC 7.2 10/19/2012   HGB 15.0  10/19/2012   HCT 44.4 10/19/2012   MCV 91.4 10/19/2012   PLT 148* 10/19/2012   CMP     Component Value Date/Time   NA 138 10/18/2012 0241   K 4.2 10/18/2012 0241   CL 101 10/18/2012 0241   CO2 27 10/18/2012 0241   GLUCOSE 155* 10/18/2012 0241   BUN 13 10/18/2012 0241   CREATININE 0.90 03/16/2013 0858   CALCIUM 9.1 10/18/2012 0241   PROT 7.1 04/30/2010 0406   ALBUMIN 2.7* 04/30/2010 0406   AST 31 04/30/2010 0406   ALT 30 04/30/2010 0406   ALKPHOS 75 04/30/2010 0406   BILITOT 0.9 04/30/2010 0406   GFRNONAA >90 10/18/2012 0241   GFRAA >90 10/18/2012 0241         RADIOGRAPHY: Ct Soft Tissue Neck W Contrast  03/16/2013   CLINICAL DATA:  Chronic lymphadenitis  EXAM: CT NECK WITH CONTRAST  TECHNIQUE: Multidetector CT imaging of the neck was performed using the standard protocol following the bolus  administration of intravenous contrast.  CONTRAST:  46mL OMNIPAQUE IOHEXOL 300 MG/ML  SOLN  COMPARISON:  None.  FINDINGS: Large mass in the right palatine tonsil measures 2.8 x 3.9 cm compatible with carcinoma. This is a solid enhancing mass without significant necrosis.  There is pathologic adenopathy in the neck bilaterally. Right level IIa lymph nodes are pathologic measuring 13 mm, and 10 mm. Right level 2B lymph node is pathologic measuring 31 x 30 mm. Right level 2/3 node is 18 x 27 mm. Pathologic left level 2B node is 27 x 21 mm.  Left thyroid mass measures 37 x 44 mm. Recommend biopsy. There is also heterogeneit and calcification in the right lobe of the thyroid without a well-defined mass. Thyroid ultrasound would be helpful for further delineation.  Cervical disc degeneration and spondylosis at C5-6 and C6-7. No acute bony changes. Lung apices are clear.  IMPRESSION: Right palatine tonsil mass measuring 2.8 x 3.9 cm compatible with carcinoma. Malignant adenopathy in the neck bilaterally.  Left thyroid mass measures 37 x 44 mm. Thyroid ultrasound and fine needle aspiration biopsy recommended to exclude neoplasm of  the thyroid.  I discussed the findings by telephone with Rowan Blase PA   Electronically Signed   By: Franchot Gallo M.D.   On: 03/16/2013 09:52   US Soft Tissue Head/neck  03/22/2013   CLINICAL DATA Thyroid nodules.  EXAM THYROID ULTRASOUND  TECHNIQUE Ultrasound examination of the thyroid gland and adjacent soft tissues was performed.  COMPARISON 04/22/2005 (thyroid biopsy) and CT 03/16/2013  FINDINGS Right thyroid lobe  Measurements: 5.9 x 2.5 x 1.7 cm. There are heterogeneous nodules in the right thyroid lobe. Largest nodule is solid and measures 1.1 x 1.0 x 1.3 cm. No definite micro calcifications.  Left thyroid lobe  Measurements: 6.8 x 4.3 x 3.0 cm. There is a dominant nodule replacing most of the left thyroid lobe. This nodule roughly measures 4.6 x 3.2 x 4.0 cm. No definite micro calcifications.  Isthmus  Thickness: 1.1 cm.  No nodules visualized.  Lymphadenopathy  None visualized.  IMPRESSION Bilateral thyroid nodules. Dominant left thyroid nodule replaces most of the left thyroid lobe.  SIGNATURE  Electronically Signed   By: Markus Daft M.D.   On: 03/22/2013 16:50   Nm Pet Image Initial (pi) Skull Base To Thigh  03/25/2013   CLINICAL DATA:  Additional treatment strategy for tonsillar cancer.  EXAM: NUCLEAR MEDICINE PET SKULL BASE TO THIGH  TECHNIQUE: 13.9 mCi F-18 FDG was injected intravenously. Full-ring PET imaging was performed from the skull base to thigh after the radiotracer. CT data was obtained and used for attenuation correction and anatomic localization.  FASTING BLOOD GLUCOSE:  Value: 173 mg/dl  COMPARISON:  CT of the neck with contrast 03/16/2013.  FINDINGS: NECK  Large soft tissue mass centered in the region of the right palatine tonsil, similar to the prior study, measuring approximately 4.1 x 3.4 cm on today's examination demonstrating diffuse hypermetabolism (SUVmax = 13.8). Bilateral hypermetabolic lymphadenopathy in the neck is demonstrated on today's study, and is similar distribution  to that recently described on CT of the neck dated 03/16/2013. The largest lymph node is a right level IIb node measuring approximately 2.9 x 2.9 cm on today's examination (SUVmax = 8.5). Other notable lymph nodes include a 14 mm short axis right level Ib lymph node (image 35 of series 4) which is hypermetabolic (SUVmax = 02.7), a 20 x 28 mm right level III lymph node (image 39 of series 4) which  is hypermetabolic (SUVmax = 60.7), a 9 mm short axis right level Va node (image 31 of series 4) which is hypermetabolic (SUVmax = .), and a 2.3 x 1.9 cm left level IIb lymph node (image 33 of series 4) which is hypermetabolic (SUVmax = 8.1). The thyroid gland is diffusely heterogeneous in appearance with mass like enlargement of the left lobe of thyroid gland, similar to that seen on the recent CT of the neck, however, with heterogeneous the calcified without definitive hypermetabolism, most suggestive of a multinodular goiter.  CHEST  No hypermetabolic mediastinal or hilar nodes. Heart size is normal. There is no significant pericardial fluid, thickening or pericardial calcification. There is atherosclerosis of the thoracic aorta, the great vessels of the mediastinum and the coronary arteries, including calcified atherosclerotic plaque in the left main, left anterior descending and left circumflex coronary arteries. Esophagus is unremarkable in appearance. 4 mm right middle lobe nodule (image 44 of series 8) is nonspecific, but unchanged in retrospect compared to prior study 12/03/2010; this can be considered benign and does not require imaging followup. No suspicious appearing pulmonary nodules or masses are otherwise noted.  ABDOMEN/PELVIS  No abnormal hypermetabolic activity within the liver, pancreas, adrenal glands, or spleen. No hypermetabolic lymph nodes in the abdomen or pelvis. Diffuse low attenuation throughout the hepatic parenchyma, compatible with hepatic steatosis. The unenhanced appearance of the  gallbladder, pancreas, spleen, bilateral adrenal glands and bilateral kidneys is unremarkable. No significant volume of ascites. No pneumoperitoneum. No pathologic distention of small bowel. Scattered colonic diverticulae are noted, without surrounding inflammatory changes to suggest an acute diverticulitis at this time. Normal appendix. Moderately distended urinary bladder.  SKELETON  No focal hypermetabolic activity to suggest skeletal metastasis. Postoperative changes of right hip arthroplasty.  IMPRESSION: 1. Large hypermetabolic soft tissue mass in the region of the right palatine tonsil with bilateral cervical hypermetabolic lymphadenopathy indicative of metastatic disease, as detailed above. 2. No signs of distal metastatic disease in the chest, abdomen or pelvis. 3. The appearance of the thyroid suggests a multinodular goiter. 4. Hepatic steatosis. 5. Colonic diverticulosis without findings to suggest acute diverticulitis at this time. 6. Additional incidental findings, as above.   Electronically Signed   By: Vinnie Langton M.D.   On: 03/25/2013 16:30   US Thyroid Biopsy  03/23/2013   CLINICAL DATA Thyroid nodules.  EXAM ULTRASOUND GUIDED NEEDLE ASPIRATE BIOPSY OF THE THYROID GLAND  COMPARISON US SOFT TISSUE HEAD/NECK dated 03/22/2013  PROCEDURE Thyroid biopsy was thoroughly discussed with the patient and questions were answered. The benefits, risks, alternatives, and complications were also discussed. The patient understands and wishes to proceed with the procedure. Written consent was obtained.  Ultrasound was performed to localize and mark an adequate site for the biopsy. The patient was then prepped and draped in a normal sterile fashion. Local anesthesia was provided with 1% lidocaine. Using direct ultrasound guidance, 3 passes were made using needles into the nodule within the left lobe of the thyroid. Ultrasound was used to confirm needle placements on all occasions. Specimens were sent to  Pathology for analysis.  Complications:  None  FINDINGS As above  IMPRESSION Ultrasound guided needle aspirate biopsy performed of the left thyroid nodule.  SIGNATURE  Electronically Signed   By: Rolm Baptise M.D.   On: 03/23/2013 11:07   03-22-13 PATHOLOGY THYROID, FINE NEEDLE ASPIRATION, LEFT (SPECIMEN 1 OF 1 COLLECTED 03/22/13): ATYPIA. FOLLICULAR EPITHELIUM IN A PREDOMINANTLY MICROFOLLICULAR PATTERN, SEE DESCRIPTION. 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   As discussed at our tumor board the day after consult, he is an excellent candidate for concurrent Erbitux and radiotherapy. Plan is as below:  1) Erbitux per Dr Alvy Bimler - I appreciate the referrals she has made to facilitate multidisciplinary care:  2) see dentistry for dental evaluation/extractions if needed, counseling, and scatter guards  3) See social work for social support  4) see nutrition for nutrition support  5) See surgery PEG tube placement. He understands the benefits of this which we discussed in detail and is willing to proceed  6) see swallowing therapy for dysphagia issues  7) simulation once cleared by dentistry. Anticipate 7 weeks of RT - 70 Gy in 35 fractions.   8) The patient was offered enrollment on our single institutional trial investigating open-faced vs close-face head/shoulder masks used to immobilize patients during head and neck radiotherapy. The patient has elected to enroll on this trial.  In regards to the trial, the patient has voluntarily signed copies of the consent forms and all trial related questions were answered.  9) I ordered baseline thyroid labs today.  10) I will refer him to PT for neck measurements in case he develops lymphedema from RT.  Also, he is  deconditioned with neuropathy and their therapy may eventually be helpful once RT is complete.  It was a pleasure meeting the patient today. We discussed the risks, benefits, and side effects of radiotherapy.  We talked in detail about acute and late effects. He understands that some of the most bothersome acute effects will be significant soreness of the mouth and throat, changes in taste, changes in salivary function, skin irritation, hair loss, dehydration, weight loss and fatigue. We talked about late effects which include but are not necessarily limited to dysphagia, hypothyroidism, dry mouth, trismus, spinal cord/nerve injury and neck edema. No guarantees of treatment were given. A consent form was signed and placed in the patient's medical record. The patient is enthusiastic about proceeding with treatment. I look forward to participating in the patient's care.   __________________________________________   Eppie Gibson, MD

## 2013-03-29 ENCOUNTER — Encounter: Payer: Self-pay | Admitting: Radiation Oncology

## 2013-03-29 ENCOUNTER — Ambulatory Visit
Admission: RE | Admit: 2013-03-29 | Discharge: 2013-03-29 | Disposition: A | Discharge status: Home / Self Care | Payer: 59 | Source: Ambulatory Visit | Attending: Radiation Oncology | Admitting: Radiation Oncology

## 2013-03-29 ENCOUNTER — Other Ambulatory Visit: Payer: Self-pay | Admitting: Radiation Oncology

## 2013-03-29 VITALS — BP 141/89 | HR 98 | Temp 98.2°F | Resp 20 | Ht 76.0 in | Wt 253.4 lb

## 2013-03-29 DIAGNOSIS — C099 Malignant neoplasm of tonsil, unspecified: Secondary | ICD-10-CM

## 2013-03-29 DIAGNOSIS — E1142 Type 2 diabetes mellitus with diabetic polyneuropathy: Secondary | ICD-10-CM | POA: Insufficient documentation

## 2013-03-29 DIAGNOSIS — E049 Nontoxic goiter, unspecified: Secondary | ICD-10-CM

## 2013-03-29 DIAGNOSIS — Z79899 Other long term (current) drug therapy: Secondary | ICD-10-CM | POA: Insufficient documentation

## 2013-03-29 DIAGNOSIS — E1149 Type 2 diabetes mellitus with other diabetic neurological complication: Secondary | ICD-10-CM | POA: Insufficient documentation

## 2013-03-29 DIAGNOSIS — Z96649 Presence of unspecified artificial hip joint: Secondary | ICD-10-CM | POA: Insufficient documentation

## 2013-03-29 HISTORY — DX: Allergy, unspecified, initial encounter: T78.40XA

## 2013-03-29 LAB — T4, FREE: Free T4: 0.9 ng/dL (ref 0.80–1.80)

## 2013-03-30 ENCOUNTER — Ambulatory Visit: Payer: 59 | Admitting: Radiation Oncology

## 2013-03-30 ENCOUNTER — Ambulatory Visit: Payer: 59

## 2013-03-30 ENCOUNTER — Telehealth: Payer: Self-pay | Admitting: *Deleted

## 2013-03-30 LAB — TSH CHCC: TSH: 0.78 m[IU]/L (ref 0.320–4.118)

## 2013-03-30 NOTE — Telephone Encounter (Signed)
Per Dr. Pearlie Oyster guidance, I called pt - spoke with his wife - to relay that per this morning's ENT Conference discussion, there is no concern that the patient's thyroid is cancerous.  She expressed appreciation for the call.  Continuing navigation as L1 patient (new patient).   Gayleen Orem, RN, BSN, [facility] Navigator [phone]

## 2013-03-31 ENCOUNTER — Encounter (HOSPITAL_COMMUNITY): Payer: Self-pay | Admitting: Dentistry

## 2013-03-31 ENCOUNTER — Telehealth: Payer: Self-pay | Admitting: Hematology and Oncology

## 2013-03-31 ENCOUNTER — Encounter: Payer: 59 | Admitting: Nutrition

## 2013-03-31 ENCOUNTER — Telehealth (INDEPENDENT_AMBULATORY_CARE_PROVIDER_SITE_OTHER): Payer: Self-pay | Admitting: General Surgery

## 2013-03-31 ENCOUNTER — Ambulatory Visit (HOSPITAL_COMMUNITY): Payer: Self-pay | Admitting: Dentistry

## 2013-03-31 ENCOUNTER — Ambulatory Visit (INDEPENDENT_AMBULATORY_CARE_PROVIDER_SITE_OTHER): Payer: 59 | Admitting: General Surgery

## 2013-03-31 ENCOUNTER — Encounter (INDEPENDENT_AMBULATORY_CARE_PROVIDER_SITE_OTHER): Payer: Self-pay | Admitting: General Surgery

## 2013-03-31 VITALS — BP 124/84 | HR 82 | Resp 18 | Ht 76.0 in | Wt 255.0 lb

## 2013-03-31 VITALS — BP 141/89 | HR 75 | Temp 98.1°F

## 2013-03-31 DIAGNOSIS — K083 Retained dental root: Secondary | ICD-10-CM

## 2013-03-31 DIAGNOSIS — IMO0002 Reserved for concepts with insufficient information to code with codable children: Secondary | ICD-10-CM

## 2013-03-31 DIAGNOSIS — K045 Chronic apical periodontitis: Secondary | ICD-10-CM

## 2013-03-31 DIAGNOSIS — K029 Dental caries, unspecified: Secondary | ICD-10-CM

## 2013-03-31 DIAGNOSIS — M270 Developmental disorders of jaws: Secondary | ICD-10-CM

## 2013-03-31 DIAGNOSIS — K0530 Chronic periodontitis, unspecified: Secondary | ICD-10-CM

## 2013-03-31 DIAGNOSIS — C099 Malignant neoplasm of tonsil, unspecified: Secondary | ICD-10-CM

## 2013-03-31 DIAGNOSIS — M278 Other specified diseases of jaws: Secondary | ICD-10-CM

## 2013-03-31 DIAGNOSIS — K036 Deposits [accretions] on teeth: Secondary | ICD-10-CM

## 2013-03-31 DIAGNOSIS — Z0189 Encounter for other specified special examinations: Secondary | ICD-10-CM

## 2013-03-31 DIAGNOSIS — M264 Malocclusion, unspecified: Secondary | ICD-10-CM

## 2013-03-31 DIAGNOSIS — K089 Disorder of teeth and supporting structures, unspecified: Secondary | ICD-10-CM

## 2013-03-31 NOTE — Telephone Encounter (Signed)
pt family mem wanted to cancel app for nut today becuse pt had another appt/resc to  3/31

## 2013-03-31 NOTE — Telephone Encounter (Signed)
Rose from Dr Alvy Bimler office called to say pt had speech and chemo class scheduled for 3/23 and would not be able to attend class if surgery was done that day. i told her this was the date that Dr Rosendo Gros requested doing this procedure . She stated she would tell Dr Alvy Bimler

## 2013-03-31 NOTE — Telephone Encounter (Signed)
Pt came na dchanged appt for speech and chemo class due to appt with surgeon moved to 3/23

## 2013-03-31 NOTE — Progress Notes (Addendum)
DENTAL CONSULTATION  Date of Consultation:  03/31/2013 Patient Name:   Weston Kallman Corea Date of Birth:   March 16, 1950 Medical Record Number: [id number]  VITALS: BP 141/89  Pulse 75  Temp(Src) 98.1 F (36.7 C) (Oral)   CHIEF COMPLAINT: Patient was referred by Dr. Alvy Bimler for a pre-chemoradiation therapy dental protocol examination.  HPI: MILLIE SHORB is a 63 year old male recently diagnosed with squamous cell carcinoma of the right tonsil. Patient with anticipated chemoradiation therapy. Patient now seen as part of a pre-chemoradiation therapy dental protocol examination.  The patient currently denies acute toothache, swellings, or abscesses. Patient has not been seen by a dentist for over 10 years. Patient saw Dr. Jannifer Franklin in [place], [place] at that time. Patient was last seen by Dr. Jannifer Franklin to have a " cap" put in. Patient does not seek regular dental care at this time and has no regular primary dentist.  PROBLEM LIST: Patient Active Problem List   Diagnosis Date Noted  . 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 Diabetic neuropathy, painful 03/22/2013  . Chest pain 10/18/2012  . Diabetes mellitus, type 2 10/18/2012  . Other and unspecified hyperlipidemia 10/18/2012  . Obesity, unspecified 10/18/2012  . Thrombocytopenia, unspecified 10/18/2012    PMH: Past Medical History  Diagnosis Date  . Diabetes mellitus without complication   . Pneumonia   . Chest pain     10/14  . Urethral stricture     s/p dilitation  . Neuropathy     compression neuropathy right hip;s/p replacement  . Cancer     tonsil ca  . Allergy   . Fibromyalgia     PSH: Past Surgical History  Procedure Laterality Date  . Total hip arthroplasty Right     Dr. Percell Miller  . Cystoscopy      Dr. Karsten Ro    ALLERGIES: Allergies  Allergen Reactions  . Neurontin [Gabapentin] Other (See Comments)    Causes seizures    MEDICATIONS: Current Outpatient Prescriptions  Medication Sig Dispense Refill  . acetaminophen (TYLENOL) 500  MG tablet Take 500 mg by mouth every 6 (six) hours as needed for pain.      [name] alprazolam (XANAX) 2 MG tablet Take 2 mg by mouth 3 (three) times daily as needed for sleep.      . Cholecalciferol (VITAMIN D3) 5000 UNITS CAPS Take 5,000 Units by mouth daily.      . citalopram (CELEXA) 20 MG tablet Take 20 mg by mouth daily.      [name] docusate sodium (COLACE) 100 MG capsule Take 100 mg by mouth 2 (two) times daily as needed for mild constipation.      . metFORMIN (GLUCOPHAGE) 500 MG tablet Take 500 mg by mouth 2 (two) times daily with a meal.      . OxyCODONE (OXYCONTIN) 10 mg T12A 12 hr tablet Take 10 mg by mouth every 12 (twelve) hours.      [name] oxyCODONE-acetaminophen (PERCOCET) 10-325 MG per tablet Take 1-2 tablets by mouth every 4 (four) hours as needed for pain.       . pregabalin (LYRICA) 150 MG capsule Take 150 mg by mouth. Take 2 tablets twice a day.       No current facility-administered medications for this visit.    LABS: Lab Results  Component Value Date   WBC 7.2 10/19/2012   HGB 15.0 10/19/2012   HCT 44.4 10/19/2012   MCV 91.4 10/19/2012   PLT 148* 10/19/2012      Component Value Date/Time   NA 138  10/18/2012 0241   K 4.2 10/18/2012 0241   CL 101 10/18/2012 0241   CO2 27 10/18/2012 0241   GLUCOSE 155* 10/18/2012 0241   BUN 13 10/18/2012 0241   CREATININE 0.90 03/16/2013 0858   CALCIUM 9.1 10/18/2012 0241   GFRNONAA >90 10/18/2012 0241   GFRAA >90 10/18/2012 0241   No results found for this basename: INR, PROTIME   No results found for this basename: PTT    SOCIAL HISTORY: History   Social History  . Marital Status: Married    Spouse Name: N/A    Number of Children: N/A  . Years of Education: N/A   Occupational History  . Not on file.   Social History Main Topics  . Smoking status: Never Smoker   . Smokeless tobacco: Never Used  . Alcohol Use: No     Comment: Occasional beer  . Drug Use: No  . Sexual Activity: Not on file   Other Topics Concern  . Not on file    Social History Narrative  . No narrative on file    FAMILY HISTORY: Family History  Problem Relation Age of Onset  . Hyperlipidemia Brother   . Cancer Mother     Deceased with leukemia, had uterine ca  . Cancer Cousin     living brain cancer, male     REVIEW OF SYSTEMS: Reviewed with the patient and is included in dental record.  DENTAL HISTORY: CHIEF COMPLAINT: Patient was referred by Dr. Alvy Bimler for a pre-chemoradiation therapy dental protocol examination.  HPI: ALEXA GOLEBIEWSKI is a 64 year old male recently diagnosed with squamous cell carcinoma of the right tonsil. Patient with anticipated chemoradiation therapy. Patient now seen as part of a pre-chemoradiation therapy dental protocol examination.  The patient currently denies acute toothache, swellings, or abscesses. Patient has not been seen by a dentist for over 10 years. Patient saw Dr. Jannifer Franklin in [place], [place] at that time. Patient was last seen by Dr. Jannifer Franklin to have a " cap" put in. Patient does not seek regular dental care at this time and has no regular primary dentist.  DENTAL EXAMINATION: GENERAL: Patient is a tall, well-developed male in no acute distress. HEAD AND NECK: The patient has bilateral neck lymphadenopathy. Patient denies acute TMJ symptoms. INTRAORAL EXAM: The patient has normal saliva. Patient has a deep palatal vault. Patient has bilateral small mandibular tori. The patient has facial exostoses in the area of tooth numbers 20-22 and 26 through 28. DENTITION: The patient is missing tooth number 16. There are retained roots in the area of tooth numbers 14 and 15. Patient has multiple rotated and malpositioned teeth. PERIODONTAL: Patient has chronic periodontitis with plaque and calculus accumulations, selective areas of gingival recession, and moderate bone loss. Tooth mobility is noted. DENTAL CARIES/SUBOPTIMAL RESTORATIONS: Patient has multiple dental caries as per dental charting form. ENDODONTIC:  Patient currently denies acute pulpitis symptoms. Patient does have multiple areas of periapical pathology and radiolucency. CROWN AND BRIDGE: Patient has multiple crown restorations noted. Some of these crowns have recurrent caries noted as per dental charting form. PROSTHODONTIC: Patient does not have partial dentures. OCCLUSION: Patient has a poor occlusal scheme secondary to multiple malpositioned teeth, multiple retained roots, and lack of replacement of all missing teeth with dental prostheses.  RADIOGRAPHIC INTERPRETATION: A suboptimal orthopantogram was taken and supplemented with a full series of dental radiographs as best able secondary to patient movement issues.  The orthopantogram was suboptimal secondary to patient being very tall and not being able  to allow the orthopantogram to move around the patient's head without interference from his shoulders.  Patient is missing tooth #16. There are retained roots in the area tooth numbers 14 and 15. There are multiple dental caries noted. There are multiple dental restorations noted some of which are suboptimal. Multiple rotated teeth are noted. There is incipient to moderate bone loss noted. There is chronic apical periodontitis and radiolucency associate with apices of tooth numbers 14 and 15.  ASSESSMENTS: 1. Squamous cell carcinoma of the right tonsil 2. Pre-chemoradiation therapy dental protocol examination 3. Chronic apical periodontitis 4. Multiple retained root segments 5. Chronic periodontitis with bone loss 6. Accretions 7. Gingival recession 8. Tooth mobility 9. Multiple dental caries and suboptimal dental restorations 10. Multiple rotated teeth 11. Malocclusion 12. Deep palatal vault 13. Bilateral mandibular lingual tori-small 14. Buccal exostosis in the area of tooth numbers 20-22 and 26-28.   PLAN/RECOMMENDATIONS: 1. I discussed the risks, benefits, and complications of various treatment options with the patient in  relationship to his medical and dental conditions, anticipated chemoradiation therapy, and chemotherapy ration therapy side effects to include xerostomia, radiation caries, trismus, mucositis, taste changes, gum and jawbone changes, and risk for infection and osteoradionecrosis.  We discussed various treatment options to include no treatment, total and subtotal extractions with alveoloplasty, pre-prosthetic surgery as indicated, periodontal therapy, dental restorations, root canal therapy, crown and bridge therapy, implant therapy, and replacement of missing teeth as indicated. The patient currently wishes to proceed with the extraction of tooth numbers 1 through 15, 50, 18, 51, 35, and 32 with alveoloplasty and pre-prosthetic surgery as indicated along with gross debridement of remaining lower dentition in the operating room with general anesthesia. This has been scheduled for Wednesday, 04/06/2013 at 8:30 AM at [facility].  The patient will then have lower fluoride trays and scatter protection devices inserted after adequate healing and prior to simulation appointment with Dr. Isidore Moos.  Upper and lower impressions were obtained today. The patient can then followup with a dentist of his choice for evaluation for replacement of missing teeth and continued dental care starting 3 months after the last radiation therapy has been completed. I will assist the patient finding a new dentist in [place], [place] as indicated. I did discuss referral to an oral surgeon for the dental extraction procedures, but the patient and wife refused this option at this time.  2. Discussion of findings with medical team and coordination of future medical and dental care as needed.  I spent 75 minutes face to face with patient and more than 50% of time was spent in counseling and /or coordination of care.   Lenn Cal, DDS

## 2013-03-31 NOTE — Patient Instructions (Signed)

## 2013-03-31 NOTE — Progress Notes (Signed)
Patient ID: [name], male   DOB: 05-27-1950, 63 y.o.   MRN: [id number]  Chief Complaint  Patient presents with  . Other    Eval PAC and peg tube placement    HPI [name] is a 63 y.o. male.  The patient is a 63 year old male who is referred by Dr. Alvy Bimler for evaluation of a Port-A-Cath placement as well as G-tube. The patient has a history of tonsillar squamous cell cancer. The patient is to undergo chemotherapy as well as radiation therapy. According to the patient and his wife chemotherapies Is supposed to start on April 10 potentially.    HPI  Past Medical History  Diagnosis Date  . Diabetes mellitus without complication   . Pneumonia   . Chest pain     10/14  . Urethral stricture     s/p dilitation  . Neuropathy     compression neuropathy right hip;s/p replacement  . Cancer     tonsil ca  . Allergy   . Fibromyalgia     Past Surgical History  Procedure Laterality Date  . Left hip replacement Right   . Cystoscopy      Family History  Problem Relation Age of Onset  . Hyperlipidemia Brother   . Cancer Mother     Deceased with leukemia, had uterine ca  . Cancer Cousin     living brain cancer, male    Social History History  Substance Use Topics  . Smoking status: Never Smoker   . Smokeless tobacco: Never Used  . Alcohol Use: No    Allergies  Allergen Reactions  . Neurontin [Gabapentin] Other (See Comments)    Causes seizures    Current Outpatient Prescriptions  Medication Sig Dispense Refill  . acetaminophen (TYLENOL) 500 MG tablet Take 500 mg by mouth every 6 (six) hours as needed for pain.      [name] alprazolam (XANAX) 2 MG tablet Take 2 mg by mouth 3 (three) times daily as needed for sleep.      . Cholecalciferol (VITAMIN D3) 5000 UNITS CAPS Take 5,000 Units by mouth daily.      . citalopram (CELEXA) 20 MG tablet Take 20 mg by mouth daily.      [name] docusate sodium (COLACE) 100 MG capsule Take 100 mg by mouth 2 (two) times daily as needed for mild  constipation.      . metFORMIN (GLUCOPHAGE) 500 MG tablet Take 500 mg by mouth 2 (two) times daily with a meal.      . OxyCODONE (OXYCONTIN) 10 mg T12A 12 hr tablet Take 10 mg by mouth every 12 (twelve) hours.      [name] oxyCODONE-acetaminophen (PERCOCET) 10-325 MG per tablet Take 1-2 tablets by mouth every 4 (four) hours as needed for pain.       . pregabalin (LYRICA) 150 MG capsule Take 150 mg by mouth. Take 2 tablets twice a day.       No current facility-administered medications for this visit.    Review of Systems Review of Systems  Constitutional: Negative.   HENT: Negative.   Eyes: Negative.   Respiratory: Negative.   Cardiovascular: Negative.   Gastrointestinal: Negative.   Endocrine: Negative.   Neurological: Negative.     Blood pressure 124/84, pulse 82, resp. rate 18, height 6\' 4"  (1.93 m), weight 255 lb (115.667 kg).  Physical Exam Physical Exam  Constitutional: He is oriented to person, place, and time. He appears well-developed and well-nourished.  HENT:  Head: Normocephalic and  atraumatic.  Eyes: Conjunctivae and EOM are normal. Pupils are equal, round, and reactive to light.  Neck: Normal range of motion. Neck supple.  Cardiovascular: Normal rate, regular rhythm and normal heart sounds.   Pulmonary/Chest: Effort normal and breath sounds normal.  Abdominal: Soft. Bowel sounds are normal. He exhibits no distension and no mass. There is no tenderness. There is no rebound and no guarding.  Musculoskeletal: Normal range of motion.  Neurological: He is alert and oriented to person, place, and time.  Skin: Skin is warm and dry.    Data Reviewed none  Assessment    63 year old male with tonsillar cancer to begin chemoradiation therapy.     Plan    1. We'll proceed to the operating room for a right Port-A-Cath placement as the patient is left-handed, as well as a laparoscopic T-tube placement. 2. I discussed the risks and benefits to the patient's wife to include  but not limited to: Infection, bleeding, damage to surrounding structures, possible need for further surgery and/or procedures, and/or possible pneumothorax. Patient voiced understanding and wishes to proceed.        Rosario Jacks., Jeremi Losito 03/31/2013, 9:52 AM

## 2013-04-01 ENCOUNTER — Encounter (HOSPITAL_COMMUNITY): Payer: Self-pay | Admitting: Pharmacy Technician

## 2013-04-01 ENCOUNTER — Encounter: Payer: Self-pay | Admitting: *Deleted

## 2013-04-01 ENCOUNTER — Encounter (HOSPITAL_COMMUNITY): Payer: Self-pay | Admitting: *Deleted

## 2013-04-01 ENCOUNTER — Encounter (HOSPITAL_COMMUNITY): Payer: Self-pay

## 2013-04-01 NOTE — Progress Notes (Signed)
[place] Psychosocial Distress Screening Clinical Social Work  Clinical Social Work was referred by distress screening protocol.  The patient scored a 10 on the Psychosocial Distress Thermometer which indicates severe distress. Clinical Social Worker phoned pt to assess for distress and other psychosocial needs. CSW spoke with wife as pt was asleep. Wife shared many concerns with CSW and was very tearful on the phone. Wife shared concerns about treatment plan, worries about upcoming surgeries, eager to speak with a chaplain, limited family support, as well as concerns for pt's current pain and diagnosis. CSW provided intensive listening and emotional support to wife. Wife appears very overwhelmed currently. CSW attempted to help wife think of several coping techniques and to also share with her the multiple resources available here to assist them. CSW agreed to make referral to chaplain, RN navigator and also arrange CSW to follow up next week. Wife seemed calmer at the end of the phone call and appeared to be open to our follow up plan.    Clinical Social Worker follow up needed: yes  If yes, follow up plan: CSW and Head and Neck team to follow up next week.   Loren Racer, LCSW Clinical Social Worker [facility] [place] Wednesday, Thursday and Friday Phone: [phone] Fax: [phone] '

## 2013-04-03 MED ORDER — CEFAZOLIN SODIUM-DEXTROSE 2-3 GM-% IV SOLR
2.0000 g | Freq: Once | INTRAVENOUS | Status: AC
  Administered 2013-04-04: 2 g via INTRAVENOUS

## 2013-04-04 ENCOUNTER — Ambulatory Visit: Payer: 59

## 2013-04-04 ENCOUNTER — Encounter (HOSPITAL_COMMUNITY): Payer: Self-pay | Admitting: *Deleted

## 2013-04-04 ENCOUNTER — Observation Stay (HOSPITAL_COMMUNITY)
Admission: RE | Admit: 2013-04-04 | Discharge: 2013-04-05 | Disposition: A | Discharge status: Home / Self Care | Payer: 59 | Source: Ambulatory Visit | Attending: General Surgery | Admitting: General Surgery

## 2013-04-04 ENCOUNTER — Other Ambulatory Visit: Payer: 59

## 2013-04-04 ENCOUNTER — Ambulatory Visit (HOSPITAL_COMMUNITY): Payer: 59

## 2013-04-04 ENCOUNTER — Encounter (HOSPITAL_COMMUNITY)
Admission: RE | Disposition: A | Discharge status: Home / Self Care | Payer: Self-pay | Source: Ambulatory Visit | Attending: General Surgery

## 2013-04-04 ENCOUNTER — Ambulatory Visit (HOSPITAL_COMMUNITY): Payer: 59 | Admitting: Anesthesiology

## 2013-04-04 ENCOUNTER — Encounter (HOSPITAL_COMMUNITY): Payer: 59 | Admitting: Anesthesiology

## 2013-04-04 DIAGNOSIS — C770 Secondary and unspecified malignant neoplasm of lymph nodes of head, face and neck: Secondary | ICD-10-CM | POA: Insufficient documentation

## 2013-04-04 DIAGNOSIS — E119 Type 2 diabetes mellitus without complications: Secondary | ICD-10-CM | POA: Insufficient documentation

## 2013-04-04 DIAGNOSIS — C099 Malignant neoplasm of tonsil, unspecified: Principal | ICD-10-CM | POA: Insufficient documentation

## 2013-04-04 DIAGNOSIS — IMO0002 Reserved for concepts with insufficient information to code with codable children: Secondary | ICD-10-CM | POA: Insufficient documentation

## 2013-04-04 DIAGNOSIS — F411 Generalized anxiety disorder: Secondary | ICD-10-CM | POA: Insufficient documentation

## 2013-04-04 DIAGNOSIS — E669 Obesity, unspecified: Secondary | ICD-10-CM | POA: Insufficient documentation

## 2013-04-04 DIAGNOSIS — Z96649 Presence of unspecified artificial hip joint: Secondary | ICD-10-CM | POA: Insufficient documentation

## 2013-04-04 DIAGNOSIS — F3289 Other specified depressive episodes: Secondary | ICD-10-CM | POA: Insufficient documentation

## 2013-04-04 DIAGNOSIS — IMO0001 Reserved for inherently not codable concepts without codable children: Secondary | ICD-10-CM | POA: Insufficient documentation

## 2013-04-04 DIAGNOSIS — Y838 Other surgical procedures as the cause of abnormal reaction of the patient, or of later complication, without mention of misadventure at the time of the procedure: Secondary | ICD-10-CM | POA: Insufficient documentation

## 2013-04-04 DIAGNOSIS — Z6835 Body mass index (BMI) 35.0-35.9, adult: Secondary | ICD-10-CM | POA: Insufficient documentation

## 2013-04-04 DIAGNOSIS — Z931 Gastrostomy status: Secondary | ICD-10-CM

## 2013-04-04 DIAGNOSIS — F329 Major depressive disorder, single episode, unspecified: Secondary | ICD-10-CM | POA: Insufficient documentation

## 2013-04-04 HISTORY — DX: Unspecified osteoarthritis, unspecified site: M19.90

## 2013-04-04 HISTORY — DX: Malignant neoplasm of tonsil, unspecified: C09.9

## 2013-04-04 HISTORY — DX: Depression, unspecified: F32.A

## 2013-04-04 HISTORY — DX: Constipation, unspecified: K59.00

## 2013-04-04 HISTORY — PX: LAPAROSCOPIC GASTROSTOMY: SHX5896

## 2013-04-04 HISTORY — PX: GASTROSTOMY TUBE PLACEMENT: SHX655

## 2013-04-04 HISTORY — DX: Type 2 diabetes mellitus without complications: E11.9

## 2013-04-04 HISTORY — PX: PORTACATH PLACEMENT: SHX2246

## 2013-04-04 HISTORY — DX: Major depressive disorder, single episode, unspecified: F32.9

## 2013-04-04 HISTORY — DX: Anxiety disorder, unspecified: F41.9

## 2013-04-04 HISTORY — PX: NASAL HEMORRHAGE CONTROL: SHX287

## 2013-04-04 LAB — BASIC METABOLIC PANEL
BUN: 12 mg/dL (ref 6–23)
CHLORIDE: 103 meq/L (ref 96–112)
CO2: 25 mEq/L (ref 19–32)
CREATININE: 0.72 mg/dL (ref 0.50–1.35)
Calcium: 9.3 mg/dL (ref 8.4–10.5)
GFR calc Af Amer: >90 mL/min (ref >90)
Glucose, Bld: 136 mg/dL — ABNORMAL HIGH (ref 70–99)
POTASSIUM: 3.9 meq/L (ref 3.7–5.3)
Sodium: 141 mEq/L (ref 137–147)

## 2013-04-04 LAB — GLUCOSE, CAPILLARY
GLUCOSE-CAPILLARY: 126 mg/dL — AB (ref 70–99)
GLUCOSE-CAPILLARY: 149 mg/dL — AB (ref 70–99)
GLUCOSE-CAPILLARY: 115 mg/dL — AB (ref 70–99)

## 2013-04-04 LAB — CBC
HCT: 43.9 % (ref 39.0–52.0)
Hemoglobin: 15 g/dL (ref 13.0–17.0)
MCH: 30.7 pg (ref 26.0–34.0)
MCHC: 34.2 g/dL (ref 30.0–36.0)
MCV: 89.8 fL (ref 78.0–100.0)
Platelets: 171 10*3/uL (ref 150–400)
RBC: 4.89 MIL/uL (ref 4.22–5.81)
RDW: 14.3 % (ref 11.5–15.5)
WBC: 8.8 10*3/uL (ref 4.0–10.5)

## 2013-04-04 LAB — SURGICAL PCR SCREEN
MRSA, PCR: NEGATIVE
STAPHYLOCOCCUS AUREUS: NEGATIVE

## 2013-04-04 SURGERY — INSERTION, TUNNELED CENTRAL VENOUS DEVICE, WITH PORT
Anesthesia: General | Site: Mouth

## 2013-04-04 MED ORDER — ROCURONIUM BROMIDE 50 MG/5ML IV SOLN
INTRAVENOUS | Status: AC
  Filled 2013-04-04: qty 1

## 2013-04-04 MED ORDER — OXYCODONE-ACETAMINOPHEN 5-325 MG PO TABS: 1.0000 | ORAL_TABLET | ORAL | Status: DC | PRN

## 2013-04-04 MED ORDER — ONDANSETRON HCL 4 MG/2ML IJ SOLN
INTRAMUSCULAR | Status: AC
Start: 2013-04-04 — End: 2013-04-04
  Filled 2013-04-04: qty 2

## 2013-04-04 MED ORDER — NEOSTIGMINE METHYLSULFATE 1 MG/ML IJ SOLN
INTRAMUSCULAR | Status: DC | PRN
  Administered 2013-04-04: 5 mg via INTRAVENOUS

## 2013-04-04 MED ORDER — FENTANYL CITRATE 0.05 MG/ML IJ SOLN
INTRAMUSCULAR | Status: DC | PRN
  Administered 2013-04-04 (×2): 50 ug via INTRAVENOUS
  Administered 2013-04-04: 100 ug via INTRAVENOUS

## 2013-04-04 MED ORDER — LIDOCAINE HCL (CARDIAC) 20 MG/ML IV SOLN
INTRAVENOUS | Status: AC
  Filled 2013-04-04: qty 5

## 2013-04-04 MED ORDER — HYDROMORPHONE HCL PF 1 MG/ML IJ SOLN
0.2500 mg | INTRAMUSCULAR | Status: DC | PRN
  Administered 2013-04-04: 0.5 mg via INTRAVENOUS
  Administered 2013-04-04 (×2): 0.25 mg via INTRAVENOUS
  Administered 2013-04-04: 0.5 mg via INTRAVENOUS

## 2013-04-04 MED ORDER — GLYCOPYRROLATE 0.2 MG/ML IJ SOLN
INTRAMUSCULAR | Status: DC | PRN
  Administered 2013-04-04: .8 mg via INTRAVENOUS

## 2013-04-04 MED ORDER — PROPOFOL 10 MG/ML IV BOLUS
INTRAVENOUS | Status: AC
  Filled 2013-04-04: qty 20

## 2013-04-04 MED ORDER — BUPIVACAINE HCL (PF) 0.25 % IJ SOLN
INTRAMUSCULAR | Status: AC
  Filled 2013-04-04: qty 30

## 2013-04-04 MED ORDER — SODIUM CHLORIDE 0.9 % IR SOLN
Status: DC | PRN
  Administered 2013-04-04: 1000 mL

## 2013-04-04 MED ORDER — OXYMETAZOLINE HCL 0.05 % NA SOLN
NASAL | Status: AC
  Filled 2013-04-04: qty 15

## 2013-04-04 MED ORDER — CHLORHEXIDINE GLUCONATE 4 % EX LIQD
1.0000 | Freq: Once | CUTANEOUS | Status: DC
Start: 2013-04-05 — End: 2013-04-04

## 2013-04-04 MED ORDER — ALPRAZOLAM 0.5 MG PO TABS
2.0000 mg | ORAL_TABLET | Freq: Three times a day (TID) | ORAL | Status: DC | PRN
  Administered 2013-04-05: 2 mg via ORAL
  Filled 2013-04-04: qty 4

## 2013-04-04 MED ORDER — ROCURONIUM BROMIDE 100 MG/10ML IV SOLN
INTRAVENOUS | Status: DC | PRN
  Administered 2013-04-04: 10 mg via INTRAVENOUS
  Administered 2013-04-04: 50 mg via INTRAVENOUS
  Administered 2013-04-04: 10 mg via INTRAVENOUS

## 2013-04-04 MED ORDER — LIDOCAINE HCL (CARDIAC) 20 MG/ML IV SOLN
INTRAVENOUS | Status: DC | PRN
  Administered 2013-04-04: 80 mg via INTRAVENOUS

## 2013-04-04 MED ORDER — NEOSTIGMINE METHYLSULFATE 1 MG/ML IJ SOLN
INTRAMUSCULAR | Status: AC
  Filled 2013-04-04: qty 10

## 2013-04-04 MED ORDER — SUCCINYLCHOLINE CHLORIDE 20 MG/ML IJ SOLN
INTRAMUSCULAR | Status: DC | PRN
  Administered 2013-04-04: 100 mg via INTRAVENOUS

## 2013-04-04 MED ORDER — PROPOFOL 10 MG/ML IV BOLUS
INTRAVENOUS | Status: DC | PRN
  Administered 2013-04-04: 200 mg via INTRAVENOUS

## 2013-04-04 MED ORDER — ACETAMINOPHEN 650 MG RE SUPP
650.0000 mg | RECTAL | Status: DC | PRN
  Filled 2013-04-04: qty 1

## 2013-04-04 MED ORDER — LACTATED RINGERS IV SOLN: INTRAVENOUS | Status: DC

## 2013-04-04 MED ORDER — OXYCODONE-ACETAMINOPHEN 10-325 MG PO TABS: 1.0000 | ORAL_TABLET | ORAL | Status: DC | PRN

## 2013-04-04 MED ORDER — LACTATED RINGERS IV SOLN
INTRAVENOUS | Status: DC
  Administered 2013-04-04: 10:00:00 via INTRAVENOUS
  Administered 2013-04-04: 50 mL/h via INTRAVENOUS
  Administered 2013-04-05: 06:00:00 via INTRAVENOUS

## 2013-04-04 MED ORDER — SODIUM CHLORIDE 0.9 % IR SOLN
Status: DC | PRN
  Administered 2013-04-04: 11:00:00

## 2013-04-04 MED ORDER — OXYCODONE HCL 5 MG PO TABS: 5.0000 mg | ORAL_TABLET | ORAL | Status: DC | PRN

## 2013-04-04 MED ORDER — OXYCODONE HCL 5 MG PO TABS
5.0000 mg | ORAL_TABLET | Freq: Once | ORAL | Status: AC | PRN
  Administered 2013-04-04: 5 mg via ORAL

## 2013-04-04 MED ORDER — SODIUM CHLORIDE 0.9 % IJ SOLN
3.0000 mL | Freq: Two times a day (BID) | INTRAMUSCULAR | Status: DC
  Administered 2013-04-04: 3 mL via INTRAVENOUS

## 2013-04-04 MED ORDER — ONDANSETRON HCL 4 MG/2ML IJ SOLN
4.0000 mg | Freq: Four times a day (QID) | INTRAMUSCULAR | Status: DC | PRN
  Administered 2013-04-05: 4 mg via INTRAVENOUS
  Filled 2013-04-04: qty 2

## 2013-04-04 MED ORDER — METFORMIN HCL 500 MG PO TABS
500.0000 mg | ORAL_TABLET | Freq: Two times a day (BID) | ORAL | Status: DC
  Administered 2013-04-04 – 2013-04-05 (×2): 500 mg via ORAL
  Filled 2013-04-04 (×4): qty 1

## 2013-04-04 MED ORDER — EPINEPHRINE HCL 1 MG/ML IJ SOLN
INTRAMUSCULAR | Status: DC | PRN
  Administered 2013-04-04: 1 mg

## 2013-04-04 MED ORDER — ACETAMINOPHEN 325 MG PO TABS
650.0000 mg | ORAL_TABLET | ORAL | Status: DC | PRN
  Filled 2013-04-04: qty 2

## 2013-04-04 MED ORDER — CEFAZOLIN SODIUM-DEXTROSE 2-3 GM-% IV SOLR
2.0000 g | INTRAVENOUS | Status: DC
  Filled 2013-04-04: qty 50

## 2013-04-04 MED ORDER — MUPIROCIN 2 % EX OINT
TOPICAL_OINTMENT | CUTANEOUS | Status: AC
  Administered 2013-04-04: 1
  Filled 2013-04-04: qty 22

## 2013-04-04 MED ORDER — EPINEPHRINE HCL 1 MG/ML IJ SOLN
INTRAMUSCULAR | Status: AC
  Filled 2013-04-04: qty 1

## 2013-04-04 MED ORDER — HYDROMORPHONE HCL PF 1 MG/ML IJ SOLN
INTRAMUSCULAR | Status: AC
  Filled 2013-04-04: qty 1

## 2013-04-04 MED ORDER — PROMETHAZINE HCL 25 MG/ML IJ SOLN: 6.2500 mg | INTRAMUSCULAR | Status: DC | PRN

## 2013-04-04 MED ORDER — MIDAZOLAM HCL 5 MG/5ML IJ SOLN
INTRAMUSCULAR | Status: DC | PRN
  Administered 2013-04-04: 2 mg via INTRAVENOUS

## 2013-04-04 MED ORDER — OXYCODONE HCL 5 MG/5ML PO SOLN: 5.0000 mg | Freq: Once | ORAL | Status: AC | PRN

## 2013-04-04 MED ORDER — PHENYLEPHRINE HCL 10 MG/ML IJ SOLN
INTRAMUSCULAR | Status: DC | PRN
  Administered 2013-04-04: 120 ug via INTRAVENOUS
  Administered 2013-04-04: 80 ug via INTRAVENOUS
  Administered 2013-04-04: 120 ug via INTRAVENOUS

## 2013-04-04 MED ORDER — SODIUM CHLORIDE 0.9 % IV SOLN
10.0000 mg | INTRAVENOUS | Status: DC | PRN
  Administered 2013-04-04: 10 ug/min via INTRAVENOUS

## 2013-04-04 MED ORDER — SODIUM CHLORIDE 0.9 % IV SOLN
250.0000 mL | INTRAVENOUS | Status: DC | PRN
Start: 2013-04-04 — End: 2013-04-05

## 2013-04-04 MED ORDER — MUPIROCIN 2 % EX OINT
TOPICAL_OINTMENT | Freq: Two times a day (BID) | CUTANEOUS | Status: DC
  Administered 2013-04-04: 1 via NASAL
  Administered 2013-04-05: 10:00:00 via NASAL
  Filled 2013-04-04: qty 22

## 2013-04-04 MED ORDER — SODIUM CHLORIDE 0.9 % IJ SOLN: 3.0000 mL | INTRAMUSCULAR | Status: DC | PRN

## 2013-04-04 MED ORDER — MIDAZOLAM HCL 2 MG/2ML IJ SOLN
INTRAMUSCULAR | Status: AC
  Filled 2013-04-04: qty 2

## 2013-04-04 MED ORDER — HYDROMORPHONE HCL PF 1 MG/ML IJ SOLN
INTRAMUSCULAR | Status: AC
Start: 2013-04-04 — End: 2013-04-05
  Filled 2013-04-04: qty 1

## 2013-04-04 MED ORDER — FENTANYL CITRATE 0.05 MG/ML IJ SOLN
INTRAMUSCULAR | Status: AC
  Filled 2013-04-04: qty 5

## 2013-04-04 MED ORDER — GLYCOPYRROLATE 0.2 MG/ML IJ SOLN
INTRAMUSCULAR | Status: AC
  Filled 2013-04-04: qty 4

## 2013-04-04 MED ORDER — OXYCODONE HCL ER 10 MG PO T12A
10.0000 mg | EXTENDED_RELEASE_TABLET | Freq: Two times a day (BID) | ORAL | Status: DC
  Administered 2013-04-04 – 2013-04-05 (×2): 10 mg via ORAL
  Filled 2013-04-04 (×2): qty 1

## 2013-04-04 MED ORDER — BUPIVACAINE HCL 0.25 % IJ SOLN
INTRAMUSCULAR | Status: DC | PRN
  Administered 2013-04-04: 18 mL

## 2013-04-04 MED ORDER — OXYCODONE HCL 5 MG PO TABS
ORAL_TABLET | ORAL | Status: AC
Start: 2013-04-04 — End: 2013-04-05
  Filled 2013-04-04: qty 1

## 2013-04-04 MED ORDER — ONDANSETRON HCL 4 MG/2ML IJ SOLN
INTRAMUSCULAR | Status: DC | PRN
  Administered 2013-04-04: 4 mg via INTRAVENOUS

## 2013-04-04 SURGICAL SUPPLY — 92 items
ADH SKN CLS APL DERMABOND .7 (GAUZE/BANDAGES/DRESSINGS)
APL SKNCLS STERI-STRIP NONHPOA (GAUZE/BANDAGES/DRESSINGS) ×2
BAG DECANTER FOR FLEXI CONT (MISCELLANEOUS) ×3 IMPLANT
BAG URINE DRAINAGE (UROLOGICAL SUPPLIES) IMPLANT
BENZOIN TINCTURE PRP APPL 2/3 (GAUZE/BANDAGES/DRESSINGS) ×3 IMPLANT
BIOPATCH RED 1 DISK 7.0 (GAUZE/BANDAGES/DRESSINGS) ×3 IMPLANT
BLADE SURG 10 STRL SS (BLADE) ×3 IMPLANT
BLADE SURG 11 STRL SS (BLADE) ×3 IMPLANT
BLADE SURG 15 STRL LF DISP TIS (BLADE) ×2 IMPLANT
BLADE SURG 15 STRL SS (BLADE) ×3
BLADE SURG ROTATE 9660 (MISCELLANEOUS) IMPLANT
CANISTER SUCTION 2500CC (MISCELLANEOUS) ×3 IMPLANT
CATH DRAINAGE MALECOT 26FR (CATHETERS) IMPLANT
CATH MALECOT (CATHETERS)
CATH MALECOT BARD  24FR (CATHETERS)
CATH MALECOT BARD 24FR (CATHETERS) IMPLANT
CHLORAPREP W/TINT 10.5 ML (MISCELLANEOUS) ×3 IMPLANT
CHLORAPREP W/TINT 26ML (MISCELLANEOUS) ×3 IMPLANT
COAGULATOR SUCT SWTCH 10FR 6 (ELECTROSURGICAL) ×3 IMPLANT
COVER SURGICAL LIGHT HANDLE (MISCELLANEOUS) ×3 IMPLANT
CRADLE DONUT ADULT HEAD (MISCELLANEOUS) ×3 IMPLANT
DECANTER SPIKE VIAL GLASS SM (MISCELLANEOUS) ×3 IMPLANT
DERMABOND ADVANCED (GAUZE/BANDAGES/DRESSINGS)
DERMABOND ADVANCED .7 DNX12 (GAUZE/BANDAGES/DRESSINGS) IMPLANT
DEVICE TROCAR PUNCTURE CLOSURE (ENDOMECHANICALS) ×3 IMPLANT
DRAPE C-ARM 42X72 X-RAY (DRAPES) ×3 IMPLANT
DRAPE CHEST BREAST 15X10 FENES (DRAPES) ×3 IMPLANT
DRAPE UTILITY 15X26 W/TAPE STR (DRAPE) ×12 IMPLANT
DRESSING OPSITE X SMALL 2X3 (GAUZE/BANDAGES/DRESSINGS) IMPLANT
DRSG COVADERM 4X6 (GAUZE/BANDAGES/DRESSINGS) ×3 IMPLANT
ELECT CAUTERY BLADE 6.4 (BLADE) ×3 IMPLANT
ELECT REM PT RETURN 9FT ADLT (ELECTROSURGICAL) ×3
ELECTRODE REM PT RTRN 9FT ADLT (ELECTROSURGICAL) ×2 IMPLANT
GAUZE SPONGE 2X2 8PLY STRL LF (GAUZE/BANDAGES/DRESSINGS) ×2 IMPLANT
GAUZE SPONGE 4X4 16PLY XRAY LF (GAUZE/BANDAGES/DRESSINGS) ×3 IMPLANT
GLOVE BIO SURGEON STRL SZ7.5 (GLOVE) ×3 IMPLANT
GLOVE BIOGEL PI IND STRL 8 (GLOVE) ×2 IMPLANT
GLOVE BIOGEL PI INDICATOR 8 (GLOVE) ×1
GLOVE ECLIPSE 6.5 STRL STRAW (GLOVE) ×3 IMPLANT
GLOVE ECLIPSE 7.5 STRL STRAW (GLOVE) ×3 IMPLANT
GOWN STRL REUS W/ TWL LRG LVL3 (GOWN DISPOSABLE) ×4 IMPLANT
GOWN STRL REUS W/ TWL XL LVL3 (GOWN DISPOSABLE) ×2 IMPLANT
GOWN STRL REUS W/TWL LRG LVL3 (GOWN DISPOSABLE) ×6
GOWN STRL REUS W/TWL XL LVL3 (GOWN DISPOSABLE) ×2
INTRODUCER 13FR (MISCELLANEOUS) IMPLANT
INTRODUCER COOK 11FR (CATHETERS) IMPLANT
KIT BASIN OR (CUSTOM PROCEDURE TRAY) ×3 IMPLANT
KIT PORT POWER 8FR ISP CVUE (Catheter) ×3 IMPLANT
KIT PORT POWER ISP 8FR (Catheter) IMPLANT
KIT POWER CATH 8FR (Catheter) IMPLANT
KIT ROOM TURNOVER OR (KITS) ×3 IMPLANT
NEEDLE HYPO 25GX1X1/2 BEV (NEEDLE) ×3 IMPLANT
NEEDLE INSUFFLATION 14GA 120MM (NEEDLE) ×3 IMPLANT
NS IRRIG 1000ML POUR BTL (IV SOLUTION) ×3 IMPLANT
PACK SURGICAL SETUP 50X90 (CUSTOM PROCEDURE TRAY) ×3 IMPLANT
PAD ARMBOARD 7.5X6 YLW CONV (MISCELLANEOUS) ×6 IMPLANT
PATTIES SURGICAL .5 X1 (DISPOSABLE) ×3 IMPLANT
PENCIL BUTTON HOLSTER BLD 10FT (ELECTRODE) ×3 IMPLANT
PLUG CATH AND CAP STER (CATHETERS) ×3 IMPLANT
SCISSORS LAP 5X35 DISP (ENDOMECHANICALS) IMPLANT
SET INTRODUCER 12FR PACEMAKER (SHEATH) IMPLANT
SET IRRIG TUBING LAPAROSCOPIC (IRRIGATION / IRRIGATOR) ×3 IMPLANT
SET SHEATH INTRODUCER 10FR (MISCELLANEOUS) IMPLANT
SHEATH COOK PEEL AWAY SET 9F (SHEATH) IMPLANT
SLEEVE ENDOPATH XCEL 5M (ENDOMECHANICALS) ×6 IMPLANT
SPECIMEN JAR SMALL (MISCELLANEOUS) IMPLANT
SPONGE GAUZE 2X2 STER 10/PKG (GAUZE/BANDAGES/DRESSINGS) ×1
SPONGE GAUZE 4X4 12PLY STER LF (GAUZE/BANDAGES/DRESSINGS) ×3 IMPLANT
STRIP CLOSURE SKIN 1/2X4 (GAUZE/BANDAGES/DRESSINGS) ×3 IMPLANT
SUT ETHILON 2 0 FS 18 (SUTURE) IMPLANT
SUT MNCRL AB 3-0 PS2 18 (SUTURE) IMPLANT
SUT MNCRL AB 3-0 PS2 27 (SUTURE) ×3 IMPLANT
SUT PROLENE 2 0 CT2 30 (SUTURE) ×3 IMPLANT
SUT SILK 2 0 SH (SUTURE) ×3 IMPLANT
SUT SILK 2 0 TIES 10X30 (SUTURE) ×3 IMPLANT
SUT SILK 3 0 SH 30 (SUTURE) ×3 IMPLANT
SUT VIC AB 3-0 SH 27 (SUTURE) ×3
SUT VIC AB 3-0 SH 27X BRD (SUTURE) ×2 IMPLANT
SYR 20ML ECCENTRIC (SYRINGE) ×6 IMPLANT
SYR 5ML LUER SLIP (SYRINGE) IMPLANT
SYR BULB 3OZ (MISCELLANEOUS) ×3 IMPLANT
SYR CONTROL 10ML LL (SYRINGE) ×3 IMPLANT
TAPE CLOTH SURG 6X10 WHT LF (GAUZE/BANDAGES/DRESSINGS) ×3 IMPLANT
TOWEL OR 17X24 6PK STRL BLUE (TOWEL DISPOSABLE) ×3 IMPLANT
TOWEL OR 17X26 10 PK STRL BLUE (TOWEL DISPOSABLE) ×3 IMPLANT
TRAY LAPAROSCOPIC (CUSTOM PROCEDURE TRAY) ×3 IMPLANT
TROCAR XCEL NON-BLD 11X100MML (ENDOMECHANICALS) ×3 IMPLANT
TROCAR XCEL NON-BLD 5MMX100MML (ENDOMECHANICALS) ×3 IMPLANT
TUBE CONNECTING 12X1/4 (SUCTIONS) ×3 IMPLANT
TUBE MOSS GAS 18FR (TUBING) ×3 IMPLANT
WATER STERILE IRR 1000ML POUR (IV SOLUTION) IMPLANT
YANKAUER SUCT BULB TIP NO VENT (SUCTIONS) ×3 IMPLANT

## 2013-04-04 NOTE — Transfer of Care (Signed)
Immediate Anesthesia Transfer of Care Note  Patient: [name]  Procedure(s) Performed: Procedure(s): INSERTION PORT-A-CATH (N/A) LAPAROSCOPIC GASTROSTOMY TUBE PLACEMENT  (N/A) Control of oropharyngeal hemorrhage (N/A)  Patient Location: PACU  Anesthesia Type:General  Level of Consciousness: sedated  Airway & Oxygen Therapy: Patient Spontanous Breathing and Patient connected to face mask oxygen  Post-op Assessment: Report given to PACU RN, Post -op Vital signs reviewed and stable and Patient moving all extremities X 4  Post vital signs: Reviewed and stable  Complications: No apparent anesthesia complications

## 2013-04-04 NOTE — Progress Notes (Signed)
Orthopedic Tech Progress Note Patient Details:  [name] April 20, 1950 [id number] Binder delivered to patient in PACU Ortho Devices Type of Ortho Device: Abdominal binder Ortho Device/Splint Interventions: Ordered   [place] 04/04/2013, 12:42 PM

## 2013-04-04 NOTE — Preoperative (Signed)
Beta Blockers   Reason not to administer Beta Blockers:Not Applicable 

## 2013-04-04 NOTE — H&P (View-Only) (Signed)
Patient ID: [name], male   DOB: Dec 28, 1950, 63 y.o.   MRN: [id number]  Chief Complaint  Patient presents with  . Other    Eval PAC and peg tube placement    HPI [name] is a 63 y.o. male.  The patient is a 63 year old male who is referred by Dr. Alvy Bimler for evaluation of a Port-A-Cath placement as well as G-tube. The patient has a history of tonsillar squamous cell cancer. The patient is to undergo chemotherapy as well as radiation therapy. According to the patient and his wife chemotherapies Is supposed to start on April 10 potentially.    HPI  Past Medical History  Diagnosis Date  . Diabetes mellitus without complication   . Pneumonia   . Chest pain     10/14  . Urethral stricture     s/p dilitation  . Neuropathy     compression neuropathy right hip;s/p replacement  . Cancer     tonsil ca  . Allergy   . Fibromyalgia     Past Surgical History  Procedure Laterality Date  . Left hip replacement Right   . Cystoscopy      Family History  Problem Relation Age of Onset  . Hyperlipidemia Brother   . Cancer Mother     Deceased with leukemia, had uterine ca  . Cancer Cousin     living brain cancer, male    Social History History  Substance Use Topics  . Smoking status: Never Smoker   . Smokeless tobacco: Never Used  . Alcohol Use: No    Allergies  Allergen Reactions  . Neurontin [Gabapentin] Other (See Comments)    Causes seizures    Current Outpatient Prescriptions  Medication Sig Dispense Refill  . acetaminophen (TYLENOL) 500 MG tablet Take 500 mg by mouth every 6 (six) hours as needed for pain.      [name] alprazolam (XANAX) 2 MG tablet Take 2 mg by mouth 3 (three) times daily as needed for sleep.      . Cholecalciferol (VITAMIN D3) 5000 UNITS CAPS Take 5,000 Units by mouth daily.      . citalopram (CELEXA) 20 MG tablet Take 20 mg by mouth daily.      [name] docusate sodium (COLACE) 100 MG capsule Take 100 mg by mouth 2 (two) times daily as needed for mild  constipation.      . metFORMIN (GLUCOPHAGE) 500 MG tablet Take 500 mg by mouth 2 (two) times daily with a meal.      . OxyCODONE (OXYCONTIN) 10 mg T12A 12 hr tablet Take 10 mg by mouth every 12 (twelve) hours.      [name] oxyCODONE-acetaminophen (PERCOCET) 10-325 MG per tablet Take 1-2 tablets by mouth every 4 (four) hours as needed for pain.       . pregabalin (LYRICA) 150 MG capsule Take 150 mg by mouth. Take 2 tablets twice a day.       No current facility-administered medications for this visit.    Review of Systems Review of Systems  Constitutional: Negative.   HENT: Negative.   Eyes: Negative.   Respiratory: Negative.   Cardiovascular: Negative.   Gastrointestinal: Negative.   Endocrine: Negative.   Neurological: Negative.     Blood pressure 124/84, pulse 82, resp. rate 18, height 6\' 4"  (1.93 m), weight 255 lb (115.667 kg).  Physical Exam Physical Exam  Constitutional: He is oriented to person, place, and time. He appears well-developed and well-nourished.  HENT:  Head: Normocephalic and  atraumatic.  Eyes: Conjunctivae and EOM are normal. Pupils are equal, round, and reactive to light.  Neck: Normal range of motion. Neck supple.  Cardiovascular: Normal rate, regular rhythm and normal heart sounds.   Pulmonary/Chest: Effort normal and breath sounds normal.  Abdominal: Soft. Bowel sounds are normal. He exhibits no distension and no mass. There is no tenderness. There is no rebound and no guarding.  Musculoskeletal: Normal range of motion.  Neurological: He is alert and oriented to person, place, and time.  Skin: Skin is warm and dry.    Data Reviewed none  Assessment    63 year old male with tonsillar cancer to begin chemoradiation therapy.     Plan    1. We'll proceed to the operating room for a right Port-A-Cath placement as the patient is left-handed, as well as a laparoscopic T-tube placement. 2. I discussed the risks and benefits to the patient's wife to include  but not limited to: Infection, bleeding, damage to surrounding structures, possible need for further surgery and/or procedures, and/or possible pneumothorax. Patient voiced understanding and wishes to proceed.        Rosario Jacks., Wendy Hoback 03/31/2013, 9:52 AM

## 2013-04-04 NOTE — Interval H&P Note (Signed)
History and Physical Interval Note:  04/04/2013 8:43 AM  [name]  has presented today for surgery, with the diagnosis of CANCER   The various methods of treatment have been discussed with the patient and family. After consideration of risks, benefits and other options for treatment, the patient has consented to  Procedure(s): INSERTION PORT-A-CATH (N/A) [name]  (N/A) as a surgical intervention .  The patient's history has been reviewed, patient examined, no change in status, stable for surgery.  I have reviewed the patient's chart and labs.  Questions were answered to the patient's satisfaction.     Rosario Jacks., Anne Hahn

## 2013-04-04 NOTE — Op Note (Addendum)
04/04/2013  11:10 AM  PATIENT:  [name]  63 y.o. male  PRE-OPERATIVE DIAGNOSIS:  CANCER   POST-OPERATIVE DIAGNOSIS:  CANCER   PROCEDURE:  Procedure(s): INSERTION PORT-A-CATH (N/A) LAPAROSCOPIC GASTROSTOMY TUBE PLACEMENT  (N/A)   SURGEON:  Surgeon(s) and Role:    * Ralene Ok, MD - Primary    * Ascencion Dike, MD - Assisting  PHYSICIAN ASSISTANT:   ASSISTANTS: none   ANESTHESIA:   local and general  EBL:  Total I/O In: 1000 [I.V.:1000] Out: -   BLOOD ADMINISTERED:none  DRAINS: Gastrostomy Tube   LOCAL MEDICATIONS USED:  BUPIVICAINE   SPECIMEN:  No Specimen  DISPOSITION OF SPECIMEN:  N/A  COUNTS:  YES  TOURNIQUET:  * No tourniquets in log *  DICTATION: .Dragon Dictation  Details of the procedure:The patient was taken back to the operating room. The patient was placed in supine position with bilateral SCDs in place. After appropriate anitbiotics were confirmed, a time-out was confirmed and all facts were verified.  A 3 cm incision was made in the left deltopectoral groove. Bovie cautery was used to maintain hemostasis and dissection was taken down just superficial to the pectoralis fashion. Blunt dissection was used to create a pocket. The port was placed in this area check for a good fit. At this time a Seldinger technique was used to cannulize the subclavian vein. This was confirmed with fluoroscopy. At this time the introducer was then placed over the wire, the wire removed. In the catheter was then placed into the introducer.  Under direct fluoroscopy the catheter was repositioned to be at [facility] /atrial junction. This was approximately 21 cm. At this time the port was connected to the catheter and secured. The port was sutured into the previously made pocket with 2-0 Prolene x3. 2-0 silk was then used to anchor the junction of the catheter and port. A final x-ray was then shot to confirm the placement of the tip of the catheter as well as the curve the catheter to the  port. At this time the [facility] needle was used to aspirate from the port and flushed easily.At this time the skin was reapproximated using a 3-0 Monocryl subcuticular fashion. The skin was Dermabond and coverderm dressing was placed prior to the G-tube placment   We then proceeded to the laparoscopic G-tube placement portion of the procedure.  A verress needle was used to insufflate the abdomen to 72mm of mercury at the right costal margin.  This followed by a 83mm trocar and camera.  There was no injury to any intraabdominal organ.    The liver was seen and was moderately nodular.  Pictures were taken.  The stomach was easily visualized.  A 51mm trocar was than placed at the umbilicus through his umbilical hernia.  A second 65mm trocar was than placed in the left lower quadrant under direct visualization.  A 2-0 silk was used to place a pursestring stitch at the level of the stomach that was going to come to the anterior abdominal wall.  At this time a 2-0 prolene was than placed at the 3:00 and 12:00 position to help anchor the stomach to the anterior abdominal wall.  A gastrotomy site was than made with the hook cautery.  An incision was made in the skin and a tonsil hemostat was used to introduce the g-tube into the stomach.  The balloon was filled with 20cc sterile saline.  All 2-0 prolene was than brought up through the abdominal wall via  an Endoclose device and tied down.  A third and fourth prolene was than placed at the 9:00 and 6:00 position.  These were brought up through the anterior abdominal wall via an Endoclose device and tied down.  The balloon was brought up to the make the stomach snug to the anterior abdominal wall.  This was all done under direct visualization.  The bolster was set tight.  It was secured in place using a 2-0 nylon x 3.  The insuflation was evacuated and all trocars were removed.  The trocar sites were than closed with 4-0 monocryl.  The skin was dressed with steristrips and guaze  and tape.  During intubation and secondary to the friability of the oral tumor, Dr. Benjamine Mola was called into exam the tumor secondary to bleeding from it.  He came in at the end of the case and his portion will be dictated under separate cover.  The patient was awakened from anesthesia and taken to recovery in stable condition.    PLAN OF CARE: Discharge to home after PACU  PATIENT DISPOSITION:  PACU - hemodynamically stable.   Delay start of Pharmacological VTE agent (>24hrs) due to surgical blood loss or risk of bleeding: not applicable

## 2013-04-04 NOTE — Anesthesia Postprocedure Evaluation (Signed)
  Anesthesia Post-op Note  Patient: [name]  Procedure(s) Performed: Procedure(s): INSERTION PORT-A-CATH (N/A) LAPAROSCOPIC GASTROSTOMY TUBE PLACEMENT  (N/A) Control of oropharyngeal hemorrhage (N/A)  Patient Location: PACU  Anesthesia Type:General  Level of Consciousness: awake, alert , oriented and patient cooperative  Airway and Oxygen Therapy: Patient Spontanous Breathing  Post-op Pain: none  Post-op Assessment: Post-op Vital signs reviewed, Patient's Cardiovascular Status Stable, Respiratory Function Stable, Patent Airway, No signs of Nausea or vomiting and Pain level controlled  Post-op Vital Signs: Reviewed and stable  Complications: soft tissue trauma to tumor, now no longer bleeding.  No apparent issues presently

## 2013-04-04 NOTE — Progress Notes (Signed)
04/04/13 0746  OBSTRUCTIVE SLEEP APNEA  Have you ever been diagnosed with sleep apnea through a sleep study? No  Do you snore loudly (loud enough to be heard through closed doors)?  1  Do you often feel tired, fatigued, or sleepy during the daytime? 1  Has anyone observed you stop breathing during your sleep? 0  Do you have, or are you being treated for high blood pressure? 0  BMI more than 35 kg/m2? 0  Age over 63 years old? 1  Neck circumference greater than 40 cm/18 inches? 1  Gender: 1  Obstructive Sleep Apnea Score 5  Score 4 or greater  Results sent to PCP

## 2013-04-04 NOTE — Anesthesia Procedure Notes (Addendum)
Procedure Name: Intubation Date/Time: 04/04/2013 9:30 AM Performed by: Kyung Rudd Pre-anesthesia Checklist: Patient identified, Emergency Drugs available, Suction available, Patient being monitored and Timeout performed Patient Re-evaluated:Patient Re-evaluated prior to inductionOxygen Delivery Method: Circle system utilized Preoxygenation: Pre-oxygenation with 100% oxygen Intubation Type: IV induction Ventilation: Mask ventilation without difficulty and Oral airway inserted - appropriate to patient size Tube type: Oral Tube size: 7.5 mm Number of attempts: 1 Airway Equipment and Method: Stylet and Video-laryngoscopy Placement Confirmation: ETT inserted through vocal cords under direct vision,  positive ETCO2 and breath sounds checked- equal and bilateral Secured at: 23 cm Tube secured with: Tape Dental Injury: Teeth and Oropharynx as per pre-operative assessment and Bloody posterior oropharynx  Comments: Grade I view with glidescope, tonsil mass bleeding after intubation. Surgeon made aware.

## 2013-04-04 NOTE — Op Note (Signed)
[name], [name] NO.:  [id number]  MEDICAL RECORD NO.:  [id number]  LOCATION:  6N13C                        FACILITY:  [facility]  PHYSICIAN:  Leta Baptist, MD            DATE OF BIRTH:  [birth year]  DATE OF PROCEDURE:  04/04/2013 DATE OF DISCHARGE:                              OPERATIVE REPORT   SURGEON:  Leta Baptist, MD  PREOPERATIVE DIAGNOSIS: 1. Metastatic right tonsillar squamous cell carcinoma. 2. Oropharyngeal hemorrhage from the right tonsillar squamous cell     carcinoma.  POSTOPERATIVE DIAGNOSIS: 1. Metastatic right tonsillar squamous cell carcinoma. 2. Oropharyngeal hemorrhage from the right tonsillar squamous cell     carcinoma.  PROCEDURE PERFORMED:  Control of right tonsillar hemorrhage.  ANESTHESIA:  General endotracheal tube anesthesia.  COMPLICATIONS:  None.  ESTIMATED BLOOD LOSS:  Minimal.  INDICATION FOR PROCEDURE:  The patient is a 63 year old male with a biopsy-proven right tonsillar squamous cell carcinoma and metastasis to bilateral cervical neck.  The patient was seen by his Oncologist, and the plan was to treat him with chemo-radiation therapy.  As a result, the patient was scheduled for Dr. Rosendo Gros to place a Port-A-Cath and PEG tube.  During the intubation process, the right tonsil cancer was traumatized, and significant bleeding was noted.  Placement of throat pack did not stop the bleeding.  I was consulted for evaluation and control of the bleeding.  DESCRIPTION OF PROCEDURE:  The patient was identified in the operating room.  He was placed supine on the operating table.  The Crowe-Davis mouth gag was used to expose the oral cavity.  Large 4 cm right tonsillar mass was noted.  Bleeding was noted from the medial aspect of the tonsillar mass.  Large amount of blood clots were suctioned.  The bleeding was controlled with suction cautery.  Pledgets soaked with 1:100,000 epinephrine was applied.  Good hemostasis was achieved.   The mouth gag was removed.  The patient was extubated and transferred to the recovery room in good condition.  OPERATIVE FINDINGS:  Bleeding was noted from the right tonsillar mass secondary to intubation trauma.  SPECIMEN:  None.  FOLLOWUP CARE:  The patient will be discharge home once he is awake and alert.     Leta Baptist, MD     ST/MEDQ  D:  04/04/2013  T:  04/04/2013  Job:  [id number]

## 2013-04-04 NOTE — Discharge Instructions (Signed)
PORT-A-CATH: POST OP INSTRUCTIONS  Always review your discharge instruction sheet given to you by the facility where your surgery was performed.   1. A prescription for pain medication may be given to you upon discharge. Take your pain medication as prescribed, if needed. If narcotic pain medicine is not needed, then you make take acetaminophen (Tylenol) or ibuprofen (Advil) as needed.  2. Take your usually prescribed medications unless otherwise directed. 3. If you need a refill on your pain medication, please contact our office. All narcotic pain medicine now requires a paper prescription.  Phoned in and fax refills are no longer allowed by law.  Prescriptions will not be filled after 5 pm or on weekends.  4. You should follow a light diet for the remainder of the day after your procedure. 5. Most patients will experience some mild swelling and/or bruising in the area of the incision. It may take several days to resolve. 6. It is common to experience some constipation if taking pain medication after surgery. Increasing fluid intake and taking a stool softener (such as Colace) will usually help or prevent this problem from occurring. A mild laxative (Milk of Magnesia or Miralax) should be taken according to package directions if there are no bowel movements after 48 hours.  7. Unless discharge instructions indicate otherwise, you may remove your bandages 48 hours after surgery, and you may shower at that time. You may have steri-strips (small white skin tapes) in place directly over the incision.  These strips should be left on the skin for 7-10 days.  If your surgeon used Dermabond (skin glue) on the incision, you may shower in 24 hours.  The glue will flake off over the next 2-3 weeks.  8. If your port is left accessed at the end of surgery (needle left in port), the dressing cannot get wet and should only by changed by a healthcare professional. When the port is no longer accessed (when the  needle has been removed), follow step 7.   9. ACTIVITIES:  Limit activity involving your arms for the next 72 hours. Do no strenuous exercise or activity for 1 week. You may drive when you are no longer taking prescription pain medication, you can comfortably wear a seatbelt, and you can maneuver your car. 10.You may need to see your doctor in the office for a follow-up appointment.  Please       check with your doctor.  11.When you receive a new Port-a-Cath, you will get a product guide and        ID card.  Please keep them in case you need them.  WHEN TO CALL YOUR DOCTOR [phone]): 1. Fever over 101.0 2. Chills 3. Continued bleeding from incision 4. Increased redness and tenderness at the site 5. Shortness of breath, difficulty breathing   The clinic staff is available to answer your questions during regular business hours. Please dont hesitate to call and ask to speak to one of the nurses or medical assistants for clinical concerns. If you have a medical emergency, go to the nearest emergency room or call 911.  A surgeon from [facility] is always on call at the hospital.     For further information, please visit [email or web address]      Gastrostomy Tube Home Guide A gastrostomy tube is a tube that is surgically placed into the stomach. It is also called a "G-tube." G-tubes are used when a person is unable to eat and drink enough on their  own to stay healthy. The tube is inserted into the stomach through a small cut (incision) in the skin. This tube is used for:  Feeding.  Giving medication. GASTROSTOMY TUBE CARE  Wash your hands with soap and water.  Remove the old dressing (if any). Some styles of G-tubes may need a dressing inserted between the skin and the G-tube. Other types of G-tubes do not require a dressing. Ask your health care provider if a dressing is needed.  Check the area where the tube enters the skin (insertion site) for redness,  swelling, or pus-like (purulent) drainage. A small amount of clear or tan liquid drainage is normal. Check to make sure scar tissue (skin) is not growing around the insertion site. This could have a raised, bumpy appearance.  A cotton swab can be used to clean the skin around the tube:  When the G-tube is first put in, a normal saline solution or water can be used to clean the skin.  Mild soap and warm water can be used when the skin around the G-tube site has healed.  Roll the cotton swab around the G-tube insertion site to remove any drainage or crusting at the insertion site. STOMACH RESIDUALS Feeding tube residuals are the amount of liquids that are in the stomach at any given time. Residuals may be checked before giving feedings, medications, or as instructed by your health care provider.  Ask your health care provider if there are instances when you would not start tube feedings depending on the amount or type of contents withdrawn from the stomach.  Check residuals by attaching a syringe to the G-tube and pull back on the syringe plunger. Note the amount and return the residual back into the stomach. FLUSHING THE G-TUBE  The G-tube should be periodically flushed with clean warm water to keep it from clogging.  Flush the G-tube after feedings or medications. Draw up 30 mL of warm water in a syringe. Connect the syringe to the G-tube and slowly push the water into the tube.  Do not push feedings, medications, or flushes rapidly. Flush the G-tube gently and slowly.  Only use syringes made for G-tubes to flush medications or feedings.  Your health care provider may want the G-tube flushed more often or with more water. If this is the case, follow your health care provider's instructions. FEEDINGS Your health care provider will determine whether feedings are given as a bolus (a certain amount given at one time and at scheduled times) or whether feedings will be given continuously on a  feeding pump.   Formulas should be given at room temperature.  If feedings are continuous, no more than 4 hours worth of feedings should be placed in the feeding bag. This helps prevent spoilage or accidental excess infusion.  Cover and place unused formula in the refrigerator.  If feedings are continuous, stop the feedings when medications or flushes are given. Be sure to restart the feedings.  Feeding bags and syringes should be replaced as instructed by your health care provider. GIVING MEDICATION   In general, it is best if all medications are in a liquid form for G-tube administration. Liquid medications are less likely to clog the G-tube.  Mix the liquid medication with 30 mL (or amount recommended by your health care provider) of warm water.  Draw up the medication into the syringe.  Attach the syringe to the G-tube and slowly push the mixture into the G-tube.  After giving the medication, draw up 30  mL of warm water in the syringe and slowly flush the G-tube.  For pills or capsules, check with your health care provider first before crushing medications. Some pills are not effective if they are crushed. Some capsules are sustained release medications.  If appropriate, crush the pill or capsule and mix with 30 mL of warm water. Using the syringe, slowly push the medication through the tube, then flush the tube with another 30 mL of tap water. G-TUBE PROBLEMS G-tube was pulled out.  Cause: May have been pulled out accidentally.  Solutions: Cover the opening with clean dressing and tape. Call your health care provider right away. The G-tube should be put in as soon as possible (within 4 hours) so the G-tube opening (tract) does not close. The G-tube needs to be put in at a healthcare setting. An X-ray needs to be done to confirm placement before the G-tube can be used again. Redness, irritation, soreness, or foul odor around the gastrostomy site.  Cause: May be caused by leakage  or infection.  Solutions: Call your health care provider right away. Large amount of leakage of fluid or mucus-like liquid present (a large amount means it soaks clothing).  Cause: Many reasons could cause the G-tube to leak.  Solutions: Call your health care provider to discuss the amount of leakage. Skin or scar tissue appears to be growing where tube enters skin.   Cause: Tissue growth may develop around the insertion site if the G-tube is moved or pulled on excessively.  Solutions: Secure tube with tape so that excess movement does not occur. Call your health care provider. G-tube is clogged.  Cause: Thick formula or medication.  Solutions: Try to slowly push warm water into the tube with a large syringe. Never try to push any object into the tube to unclog it. Do not force fluid into the G-tube. If you are unable to unclog the tube, call your health care provider right away. TIPS  Head of Bed [facility]) position refers to the upright position of a person's upper body.  When giving medications or a feeding bolus, keep the [facility] up as told by your health care provider. Do this during the feeding and for 1 hour after the feeding or medication administration.  If continuous feedings are being given, it is best to keep the [facility] up as told by your health care provider. When ADLs (Activities of Daily Living) are performed and the [facility] needs to be flat, be sure to turn the feeding pump off. Restart the feeding pump when the [facility] is returned to the recommended height.  Do not pull or put tension on the tube.  To prevent fluid backflow, kink the G-tube before removing the cap or disconnecting a syringe.  Check the G-tube length every day. Measure from the insertion site to the end of the G-tube. If the length is longer than previous measurements, the tube may be coming out. Call your health care provider if you notice increasing G-tube length.  Oral care, such as brushing teeth, must be  continued.  You may need to remove excess air (vent) from the G-tube. Your health care provider will tell you if this is needed.  Always call your health care provider if you have questions or problems with the G-tube. SEEK IMMEDIATE MEDICAL CARE IF:   You have severe abdominal pain, tenderness, or abdominal bloating (distension).  You have nausea or vomiting.  You are constipated or have problems moving your bowels.  The G-tube insertion  site is red, swollen, has a foul smell, or has yellow or brown drainage.  You have difficulty breathing or shortness of breath.  You have a fever.  You have a large amount of feeding tube residuals.  The G-tube is clogged and cannot be flushed. MAKE SURE YOU:   Understand these instructions.  Will watch your condition.  Will get help right away if you are not doing well or get worse. Document Released: 03/10/2001 Document Revised: 10/20/2012 Document Reviewed: 09/06/2012 [facility] Patient Information 2014 [place].   What to eat:  For your first meals, you should eat lightly; only small meals initially.  If you do not have nausea, you may eat larger meals.  Avoid spicy, greasy and heavy food.    General Anesthesia, Adult, Care After  Refer to this sheet in the next few weeks. These instructions provide you with information on caring for yourself after your procedure. Your health care provider may also give you more specific instructions. Your treatment has been planned according to current medical practices, but problems sometimes occur. Call your health care provider if you have any problems or questions after your procedure.  WHAT TO EXPECT AFTER THE PROCEDURE  After the procedure, it is typical to experience:  Sleepiness.  Nausea and vomiting. HOME CARE INSTRUCTIONS  For the first 24 hours after general anesthesia:  Have a responsible person with you.  Do not drive a car. If you are alone, do not take public transportation.   Do not drink alcohol.  Do not take medicine that has not been prescribed by your health care provider.  Do not sign important papers or make important decisions.  You may resume a normal diet and activities as directed by your health care provider.  Change bandages (dressings) as directed.  If you have questions or problems that seem related to general anesthesia, call the hospital and ask for the anesthetist or anesthesiologist on call. SEEK MEDICAL CARE IF:  You have nausea and vomiting that continue the day after anesthesia.  You develop a rash. SEEK IMMEDIATE MEDICAL CARE IF:  You have difficulty breathing.  You have chest pain.  You have any allergic problems. Document Released: 04/07/2000 Document Revised: 09/01/2012 Document Reviewed: 07/15/2012  [facility] Patient Information 2014 [place], [place].

## 2013-04-04 NOTE — Anesthesia Preprocedure Evaluation (Addendum)
Anesthesia Evaluation  Patient identified by MRN, date of birth, ID band Patient awake    Reviewed: Allergy & Precautions, H&P , NPO status , Patient's Chart, lab work & pertinent test results  History of Anesthesia Complications Negative for: history of anesthetic complications  Airway   Neck ROM: Full    Dental  (+) Teeth Intact, Chipped,    Pulmonary neg pulmonary ROS,  breath sounds clear to auscultation        Cardiovascular negative cardio ROS  Rhythm:Regular Rate:Normal     Neuro/Psych Anxiety Depression  Neuromuscular disease    GI/Hepatic Tonsillar ca,difficulty swallowing   Endo/Other  diabetes  Renal/GU      Musculoskeletal   Abdominal (+) + obese,   Peds  Hematology  (+) Blood dyscrasia, ,   Anesthesia Other Findings   Reproductive/Obstetrics                          Anesthesia Physical Anesthesia Plan  ASA: III  Anesthesia Plan: General   Post-op Pain Management:    Induction: Intravenous  Airway Management Planned: Oral ETT and Video Laryngoscope Planned  Additional Equipment:   Intra-op Plan:   Post-operative Plan: Extubation in OR  Informed Consent: I have reviewed the patients History and Physical, chart, labs and discussed the procedure including the risks, benefits and alternatives for the proposed anesthesia with the patient or authorized representative who has indicated his/her understanding and acceptance.   Dental advisory given  Plan Discussed with: CRNA and Surgeon  Anesthesia Plan Comments:         Anesthesia Quick Evaluation

## 2013-04-05 ENCOUNTER — Encounter (HOSPITAL_COMMUNITY): Payer: Self-pay | Admitting: General Surgery

## 2013-04-05 ENCOUNTER — Other Ambulatory Visit: Payer: 59

## 2013-04-05 MED ORDER — OXYCODONE-ACETAMINOPHEN 5-325 MG PO TABS: 1.0000 | ORAL_TABLET | ORAL | Status: DC | PRN

## 2013-04-05 NOTE — Progress Notes (Signed)
1 Day Post-Op  Subjective: PT doing well this AM.  Had some n/v this AM.  O2Sats OK overnight  Objective: Vital signs in last 24 hours: Temp:  [97.5 F (36.4 C)-98.5 F (36.9 C)] 98.5 F (36.9 C) (03/24 0620) Pulse Rate:  [75-194] 79 (03/24 0620) Resp:  [12-20] 18 (03/24 0620) BP: (126-163)/(69-107) 153/78 mmHg (03/24 0620) SpO2:  [84 %-100 %] 93 % (03/24 0620) Weight:  [255 lb (115.667 kg)] 255 lb (115.667 kg) (03/23 1630)    Intake/Output from previous day: 03/23 0701 - 03/24 0700 In: 2050 [I.V.:2050] Out: 2975 [Urine:1375; Emesis/NG output:1500; Blood:100] Intake/Output this shift:    General appearance: alert and cooperative Resp: clear to auscultation bilaterally GI: soft, NtND active BS, gtube in place  Lab Results:   Recent Labs  04/04/13 0732  WBC 8.8  HGB 15.0  HCT 43.9  PLT 171   BMET  Recent Labs  04/04/13 0732  NA 141  K 3.9  CL 103  CO2 25  GLUCOSE 136*  BUN 12  CREATININE 0.72  CALCIUM 9.3   PT/INR No results found for this basename: LABPROT, INR,  in the last 72 hours ABG No results found for this basename: PHART, PCO2, PO2, HCO3,  in the last 72 hours  Studies/Results: X-ray Chest Pa Or Ap  04/04/2013   CLINICAL DATA:  Port-A-Cath insertion.  EXAM: CHEST - 1 VIEW  COMPARISON:  10/17/2012.  FINDINGS: The right subclavian Port-A-Cath tip is in the mid distal SVC approximately 1 cm below the carina. No complicating features such as pneumothorax or hematoma. Very low lung volumes with vascular crowding and atelectasis.  IMPRESSION: Right subclavian Port-A-Cath tip is in the mid distal SVC. No complicating features.   Electronically Signed   By: Kalman Jewels M.D.   On: 04/04/2013 12:51   Dg Fluoro Guide Cv Line-no Report  04/04/2013   CLINICAL DATA: PAC placement   FLOURO GUIDE CV LINE  Fluoroscopy was utilized by the requesting physician.  No radiographic  interpretation.     Anti-infectives: Anti-infectives   Start     Dose/Rate Route  Frequency Ordered Stop   04/06/13 0000  ceFAZolin (ANCEF) IVPB 2 g/50 mL premix     2 g 100 mL/hr over 30 Minutes Intravenous  Once 04/03/13 1356 04/04/13 0945   04/04/13 0727  ceFAZolin (ANCEF) IVPB 2 g/50 mL premix  Status:  Discontinued     2 g 100 mL/hr over 30 Minutes Intravenous On call to O.R. 04/04/13 0727 04/04/13 1620      Assessment/Plan: s/p Procedure(s): INSERTION PORT-A-CATH (N/A) LAPAROSCOPIC GASTROSTOMY TUBE PLACEMENT  (N/A) Control of oropharyngeal hemorrhage (N/A) Discharge as long as O2 sats OK IS  LOS: 1 day    Rosario Jacks., Anne Hahn 04/05/2013

## 2013-04-05 NOTE — Discharge Summary (Signed)
Physician Discharge Summary  Patient ID: [name] MRN: [id number] DOB/AGE: [birth year] 63 y.o.  Admit date: 04/04/2013 Discharge date: 04/05/2013  Admission Diagnoses:s/p Lap Gtube and [facility]  Discharge Diagnoses:  Active Problems:   Gastrostomy in place   Discharged Condition: good  Hospital Course: PT was admitted post op secondary to O2 sats in the 89s.  Pt had no issues with O2 sats after admission.  He was pulling 2500 on his IS.  His gtube was in place.  Pain controlled.  He was deemed stable for Dc  Consults: None  Significant Diagnostic Studies: none  Treatments: surgery: as above  Discharge Exam: Blood pressure 153/78, pulse 79, temperature 98.5 F (36.9 C), temperature source Oral, resp. rate 18, height 6\' 4"  (1.93 m), weight 255 lb (115.667 kg), SpO2 93.00%. General appearance: alert and cooperative Resp: clear to auscultation bilaterally Cardio: regular rate and rhythm, S1, S2 normal, no murmur, click, rub or gallop GI: soft, non-tender; bowel sounds normal; no masses,  no organomegaly  Disposition: 01-Home or Self Care   Future Appointments Provider Department Dept Phone   04/05/2013 12:00 PM [facility] [phone]   04/06/2013 1:00 PM Heath Lark, MD [facility] [phone]   04/11/2013 1:15 PM [facility] [phone]   04/11/2013 1:15 PM [place] [place] [phone]   04/11/2013 3:15 PM Karie Mainland, [facility] [phone]   04/18/2013 11:30 AM Ralene Ok, MD [facility], [place] [phone]       Medication List         acetaminophen 500 MG tablet  Commonly known as:  TYLENOL  Take 500-1,000 mg by mouth every 4 (four) hours as needed for mild pain.     alprazolam 2 MG tablet  Commonly known as:  XANAX  Take  2 mg by mouth 3 (three) times daily as needed for sleep or anxiety.     citalopram 20 MG tablet  Commonly known as:  CELEXA  Take 20 mg by mouth daily.     docusate sodium 100 MG capsule  Commonly known as:  COLACE  Take 100 mg by mouth 2 (two) times daily as needed for mild constipation.     metFORMIN 500 MG tablet  Commonly known as:  GLUCOPHAGE  Take 500 mg by mouth 2 (two) times daily with a meal.     OxyCODONE 10 mg T12a 12 hr tablet  Commonly known as:  OXYCONTIN  Take 10 mg by mouth every 12 (twelve) hours.     oxyCODONE-acetaminophen 10-325 MG per tablet  Commonly known as:  PERCOCET  Take 1-2 tablets by mouth every 4 (four) hours as needed for pain.     oxyCODONE-acetaminophen 10-325 MG per tablet  Commonly known as:  PERCOCET  Take 1 tablet by mouth every 4 (four) hours as needed for pain.     oxyCODONE-acetaminophen 5-325 MG per tablet  Commonly known as:  ROXICET  Take 1-2 tablets by mouth every 4 (four) hours as needed for severe pain.     pregabalin 150 MG capsule  Commonly known as:  LYRICA  Take 300 mg by mouth 2 (two) times daily.     Vitamin D3 5000 UNITS Caps  Take 5,000 Units by mouth daily.           Follow-up Information   Follow up with Rosario Jacks., Anne Hahn,  MD. Schedule an appointment as soon as possible for a visit in 2 weeks.   Specialty:  General Surgery   Contact information:   [address] [address] [phone]       Follow up with [place], [place], MD.   Specialty:  Hematology and Oncology   Contact information:   [place] [address] [phone]       Signed: Rosario Jacks., Anne Hahn 04/05/2013, 8:14 AM

## 2013-04-05 NOTE — Progress Notes (Signed)
Called pt to do pre-op call (pt still in hospital), he states he has just cancelled the surgery because he still doesn't feel well from the procedure he had done yesterday. Dr. Enrique Sack is aware.

## 2013-04-05 NOTE — Progress Notes (Signed)
Pt and pt's daughter present for discharge instructions. Handouts given to patient/daughter on education of G-tube. Follow up appointments reviewed. Rx given. When to call the physician was also reviewed along with signs and symptoms of infection. Educated pt and pt's daughter on care of surgical sites.

## 2013-04-06 ENCOUNTER — Ambulatory Visit (HOSPITAL_COMMUNITY): Admission: RE | Admit: 2013-04-06 | Payer: 59 | Source: Ambulatory Visit | Admitting: Dentistry

## 2013-04-06 ENCOUNTER — Other Ambulatory Visit (HOSPITAL_COMMUNITY): Payer: Self-pay | Admitting: Dentistry

## 2013-04-06 ENCOUNTER — Encounter (HOSPITAL_COMMUNITY): Admission: RE | Payer: Self-pay | Source: Ambulatory Visit

## 2013-04-06 ENCOUNTER — Ambulatory Visit: Payer: 59 | Admitting: Hematology and Oncology

## 2013-04-06 SURGERY — MULTIPLE EXTRACTION WITH ALVEOLOPLASTY
Anesthesia: General

## 2013-04-08 ENCOUNTER — Encounter (HOSPITAL_COMMUNITY): Payer: Self-pay

## 2013-04-11 ENCOUNTER — Ambulatory Visit: Payer: 59 | Admitting: Nutrition

## 2013-04-11 ENCOUNTER — Encounter (HOSPITAL_COMMUNITY): Payer: Self-pay | Admitting: Vascular Surgery

## 2013-04-11 ENCOUNTER — Ambulatory Visit: Payer: 59

## 2013-04-11 ENCOUNTER — Ambulatory Visit: Payer: 59 | Attending: Hematology and Oncology

## 2013-04-11 ENCOUNTER — Encounter: Payer: Self-pay | Admitting: *Deleted

## 2013-04-11 DIAGNOSIS — G589 Mononeuropathy, unspecified: Secondary | ICD-10-CM | POA: Insufficient documentation

## 2013-04-11 DIAGNOSIS — C099 Malignant neoplasm of tonsil, unspecified: Secondary | ICD-10-CM | POA: Insufficient documentation

## 2013-04-11 NOTE — Progress Notes (Signed)
Anesthesia Chart Review:  Patient is a 63 year old male scheduled for multiple teeth extraction with alveoloplasty and gross debridement of remaining teeth on 04/14/2013 by Dr. Enrique Sack.  He will be a same day work-up.  History includes metastatic right tonsillar SCC, right Port-a-cath and G-tube insertion 1/91/47 complicated by oropharyngeal hemorrhage during intubation due to the friability of the tonsillar tumor requiring intra-operative ENT consultation for suction cautery and epinephrine soaked pledgets for control of bleeding. He was intubated with stylet and video laryngoscopy with 7.5 mm ETT.  Grade 1 view with glidescope, tonsil mass bleeding after intubation.  Other history includes non-cardiac chest pain (low risk Lexiscan on 10/19/12, and PRN cardiology f/u recommended), DM2, HLD, non-smoker, depression, anxiety, PNA '12, fibromyalgia, arthritis.  EKG on 11/01/12 showed NSR, incomplete right BBB.  Echo on 10/18/12 showed: - Study data: Technically adequate study - Left ventricle: The cavity size was normal. Wall thickness was normal. Systolic function was normal. The estimated ejection fraction was in the range of 60% to 65%. Wall motion was normal; there were no regional wall motion abnormalities. Left ventricular diastolic function parameters were normal. - Aortic valve: Mildly calcified annulus. Trileaflet; mildly thickened leaflets. Valve area: 1.7cm^2(VTI). - Left atrium: The atrium was moderately dilated.  Nuclear stress test on 10/19/12 showed: A defect in the inferior wall likely due to subdiaphragmatic attenuation. It is present in both the resting and stress images, with increased trace uptake in the stress images. There are no other perfusion defects. Gated imaging reveals an EDV 109 mL, ESV 50 mL, T I D ratio 1.11, and LVEF 54% of with no wall motion abnormalities. IMPRESSION:Low risk study for obstructive coronary artery disease. Low risk study for major cardiac events.  1V CXR  on 04/04/13 showed: FINDINGS: The right subclavian Port-A-Cath tip is in the mid distal SVC approximately 1 cm below the carina. No complicating features such as pneumothorax or hematoma. Very low lung volumes with vascular crowding and atelectasis. IMPRESSION: Right subclavian Port-A-Cath tip is in the mid distal SVC. No complicating features.  Labs from 04/04/13 noted.  CBC WNL. Glucose 136.  I reviewed above with anesthesiologist Dr. Glennon Mac.  She was the anesthesiologist at the end of  His case on 04/04/13.  Planned glidescope for intubation.  Further evaluation and definitive anesthesia plan following evaluation by his assigned anesthesiologist on the day of surgery.  Myra Gianotti, PA-C [facility] Short Stay Center/Anesthesiology Phone [phone] 04/11/2013 2:00 PM

## 2013-04-11 NOTE — Progress Notes (Signed)
Patient is a 63 year old male diagnosed with tonsil cancer.  He is a patient of Dr. Alvy Bimler.  Past medical history includes diabetes, pneumonia, urethral stricture, thyroid mass.  Medications include Xanax, vitamin D3, Celexa, Colace, Glucophage, and Lyrica.  Height: 6 feet 4 inches. Weight: 255 pounds March 30. Usual body weight 264 pounds October 2014. BMI: 31.05.  Patient reports some difficulty swallowing.  He is eating a soft diet without difficulty.  His weight is stable.  Patient reports he is status post feeding tube placement.  He was not set up with home health care and has not flushed his tube in over a week.  Wife feels comfortable with the process of flushing the tube.  She does not have a syringe.  Patient is nervous about dental work on Thursday.  Nutrition diagnosis: Predicted suboptimal energy intake related to diagnosis of tonsil cancer and associated treatments as evidenced by the history or presence of this condition for which research shows an increased incidence of suboptimal energy intake.  Intervention: Patient educated to consume small, frequent meals with increased protein throughout the day.  Reviewed foods that are high in protein.  Encouraged patient to consume soft, moist foods and to blenderize foods as needed after dental surgery.  Recommended patient drink Ensure Plus 4 times a day until solid food intake improves.  Provided patient with samples.  Brief education on side effect management, during treatment.  I provided fact sheets for patient.  I also provided a syringe for patient to take so he can flush his feeding tube.  Wife demonstrated teach back for tube feeding flushes.  Teach back method used.  Monitoring, evaluation, goals: Patient will tolerate adequate calories and protein to minimize weight loss.  He'll begin using feeding tube when oral intake decreases and 5% weight loss occurs.  Next visit: Will be scheduled with first chemotherapy.

## 2013-04-11 NOTE — Progress Notes (Signed)
Met with patient and his wife s/p nutritional appt with Dory Peru.  Extractions are scheduled for 04/14/13 at [facility] with Dr. Enrique Sack.  Wife stated that they need to re-schedule Chemo Education and an appt with Dr. Alvy Bimler.  I told her I would forward scheduling request.  Continuing navigation as L1 patient (new patient).  Gayleen Orem, RN, BSN, [facility] Navigator [phone]

## 2013-04-12 ENCOUNTER — Ambulatory Visit: Payer: 59

## 2013-04-12 ENCOUNTER — Ambulatory Visit: Payer: 59 | Admitting: Physical Therapy

## 2013-04-13 ENCOUNTER — Encounter (HOSPITAL_COMMUNITY): Payer: Self-pay | Admitting: *Deleted

## 2013-04-13 MED ORDER — CEFAZOLIN SODIUM-DEXTROSE 2-3 GM-% IV SOLR
2.0000 g | Freq: Once | INTRAVENOUS | Status: AC
  Administered 2013-04-14: 2 g via INTRAVENOUS
  Filled 2013-04-13: qty 50

## 2013-04-13 NOTE — Progress Notes (Signed)
04/13/13 1200  OBSTRUCTIVE SLEEP APNEA  Have you ever been diagnosed with sleep apnea through a sleep study? No  Do you snore loudly (loud enough to be heard through closed doors)?  1  Do you often feel tired, fatigued, or sleepy during the daytime? 1  Has anyone observed you stop breathing during your sleep? 0  Do you have, or are you being treated for high blood pressure? 0  BMI more than 35 kg/m2? 0  Age over 63 years old? 1  Neck circumference greater than 40 cm/18 inches? 1  Gender: 1  Obstructive Sleep Apnea Score 5  Score 4 or greater  Results sent to PCP

## 2013-04-14 ENCOUNTER — Encounter (HOSPITAL_COMMUNITY): Payer: Self-pay | Admitting: *Deleted

## 2013-04-14 ENCOUNTER — Encounter (HOSPITAL_COMMUNITY)
Admission: RE | Disposition: A | Discharge status: Home / Self Care | Payer: Self-pay | Source: Ambulatory Visit | Attending: Dentistry

## 2013-04-14 ENCOUNTER — Ambulatory Visit (HOSPITAL_COMMUNITY): Payer: 59 | Admitting: Vascular Surgery

## 2013-04-14 ENCOUNTER — Encounter (HOSPITAL_COMMUNITY): Payer: 59 | Admitting: Vascular Surgery

## 2013-04-14 ENCOUNTER — Ambulatory Visit (HOSPITAL_COMMUNITY)
Admission: RE | Admit: 2013-04-14 | Discharge: 2013-04-14 | Disposition: A | Discharge status: Home / Self Care | Payer: 59 | Source: Ambulatory Visit | Attending: Dentistry | Admitting: Dentistry

## 2013-04-14 ENCOUNTER — Other Ambulatory Visit: Payer: Self-pay | Admitting: Hematology and Oncology

## 2013-04-14 DIAGNOSIS — K083 Retained dental root: Secondary | ICD-10-CM

## 2013-04-14 DIAGNOSIS — IMO0001 Reserved for inherently not codable concepts without codable children: Secondary | ICD-10-CM | POA: Insufficient documentation

## 2013-04-14 DIAGNOSIS — K0530 Chronic periodontitis, unspecified: Secondary | ICD-10-CM | POA: Diagnosis present

## 2013-04-14 DIAGNOSIS — IMO0002 Reserved for concepts with insufficient information to code with codable children: Secondary | ICD-10-CM | POA: Insufficient documentation

## 2013-04-14 DIAGNOSIS — K045 Chronic apical periodontitis: Secondary | ICD-10-CM

## 2013-04-14 DIAGNOSIS — K029 Dental caries, unspecified: Secondary | ICD-10-CM

## 2013-04-14 DIAGNOSIS — C099 Malignant neoplasm of tonsil, unspecified: Secondary | ICD-10-CM | POA: Insufficient documentation

## 2013-04-14 DIAGNOSIS — Z96649 Presence of unspecified artificial hip joint: Secondary | ICD-10-CM | POA: Insufficient documentation

## 2013-04-14 DIAGNOSIS — E119 Type 2 diabetes mellitus without complications: Secondary | ICD-10-CM | POA: Insufficient documentation

## 2013-04-14 DIAGNOSIS — Y9210 Unspecified residential institution as the place of occurrence of the external cause: Secondary | ICD-10-CM | POA: Insufficient documentation

## 2013-04-14 DIAGNOSIS — M898X9 Other specified disorders of bone, unspecified site: Secondary | ICD-10-CM | POA: Insufficient documentation

## 2013-04-14 DIAGNOSIS — M270 Developmental disorders of jaws: Secondary | ICD-10-CM | POA: Diagnosis present

## 2013-04-14 HISTORY — DX: Adverse effect of unspecified anesthetic, initial encounter: T41.45XA

## 2013-04-14 HISTORY — PX: MULTIPLE EXTRACTIONS WITH ALVEOLOPLASTY: SHX5342

## 2013-04-14 HISTORY — DX: Other complications of anesthesia, initial encounter: T88.59XA

## 2013-04-14 LAB — GLUCOSE, CAPILLARY
Glucose-Capillary: 143 mg/dL — ABNORMAL HIGH (ref 70–99)
Glucose-Capillary: 179 mg/dL — ABNORMAL HIGH (ref 70–99)

## 2013-04-14 SURGERY — MULTIPLE EXTRACTION WITH ALVEOLOPLASTY
Anesthesia: General | Site: Mouth

## 2013-04-14 MED ORDER — GLYCOPYRROLATE 0.2 MG/ML IJ SOLN
INTRAMUSCULAR | Status: AC
Start: 2013-04-14 — End: 2013-04-14
  Filled 2013-04-14: qty 2

## 2013-04-14 MED ORDER — OXYMETAZOLINE HCL 0.05 % NA SOLN
NASAL | Status: AC
  Filled 2013-04-14: qty 15

## 2013-04-14 MED ORDER — ARTIFICIAL TEARS OP OINT
TOPICAL_OINTMENT | OPHTHALMIC | Status: AC
  Filled 2013-04-14: qty 3.5

## 2013-04-14 MED ORDER — BUPIVACAINE-EPINEPHRINE 0.5% -1:200000 IJ SOLN
INTRAMUSCULAR | Status: DC | PRN
  Administered 2013-04-14 (×2): 1.7 mL

## 2013-04-14 MED ORDER — FENTANYL CITRATE 0.05 MG/ML IJ SOLN: 50.0000 ug | Freq: Once | INTRAMUSCULAR | Status: DC

## 2013-04-14 MED ORDER — DEXAMETHASONE SODIUM PHOSPHATE 4 MG/ML IJ SOLN
INTRAMUSCULAR | Status: DC | PRN
  Administered 2013-04-14: 8 mg via INTRAVENOUS

## 2013-04-14 MED ORDER — FENTANYL CITRATE 0.05 MG/ML IJ SOLN: 25.0000 ug | INTRAMUSCULAR | Status: DC | PRN

## 2013-04-14 MED ORDER — OXYCODONE HCL 5 MG PO TABS: 5.0000 mg | ORAL_TABLET | Freq: Once | ORAL | Status: DC | PRN

## 2013-04-14 MED ORDER — 0.9 % SODIUM CHLORIDE (POUR BTL) OPTIME
TOPICAL | Status: DC | PRN
  Administered 2013-04-14: 1000 mL

## 2013-04-14 MED ORDER — MIDAZOLAM HCL 2 MG/2ML IJ SOLN
INTRAMUSCULAR | Status: AC
  Filled 2013-04-14: qty 2

## 2013-04-14 MED ORDER — ROCURONIUM BROMIDE 50 MG/5ML IV SOLN
INTRAVENOUS | Status: AC
  Filled 2013-04-14: qty 1

## 2013-04-14 MED ORDER — GLYCOPYRROLATE 0.2 MG/ML IJ SOLN
INTRAMUSCULAR | Status: DC | PRN
  Administered 2013-04-14: 0.4 mg via INTRAVENOUS

## 2013-04-14 MED ORDER — ONDANSETRON HCL 4 MG/2ML IJ SOLN
INTRAMUSCULAR | Status: AC
  Filled 2013-04-14: qty 2

## 2013-04-14 MED ORDER — LIDOCAINE-EPINEPHRINE 2 %-1:100000 IJ SOLN
INTRAMUSCULAR | Status: DC | PRN
  Administered 2013-04-14 (×6): 1.7 mL

## 2013-04-14 MED ORDER — LIDOCAINE-EPINEPHRINE 2 %-1:100000 IJ SOLN
INTRAMUSCULAR | Status: AC
  Filled 2013-04-14: qty 10.2

## 2013-04-14 MED ORDER — ONDANSETRON HCL 4 MG/2ML IJ SOLN
INTRAMUSCULAR | Status: DC | PRN
  Administered 2013-04-14: 4 mg via INTRAVENOUS

## 2013-04-14 MED ORDER — FENTANYL CITRATE 0.05 MG/ML IJ SOLN
INTRAMUSCULAR | Status: AC
  Filled 2013-04-14: qty 5

## 2013-04-14 MED ORDER — NEOSTIGMINE METHYLSULFATE 1 MG/ML IJ SOLN
INTRAMUSCULAR | Status: AC
Start: 2013-04-14 — End: 2013-04-14
  Filled 2013-04-14: qty 10

## 2013-04-14 MED ORDER — METOPROLOL TARTRATE 1 MG/ML IV SOLN
INTRAVENOUS | Status: DC | PRN
  Administered 2013-04-14: 3 mg via INTRAVENOUS
  Administered 2013-04-14: 2 mg via INTRAVENOUS

## 2013-04-14 MED ORDER — DEXAMETHASONE SODIUM PHOSPHATE 4 MG/ML IJ SOLN
INTRAMUSCULAR | Status: AC
  Filled 2013-04-14: qty 2

## 2013-04-14 MED ORDER — MIDAZOLAM HCL 2 MG/2ML IJ SOLN: 1.0000 mg | INTRAMUSCULAR | Status: DC | PRN

## 2013-04-14 MED ORDER — LIDOCAINE HCL (CARDIAC) 20 MG/ML IV SOLN
INTRAVENOUS | Status: DC | PRN
  Administered 2013-04-14: 100 mg via INTRAVENOUS

## 2013-04-14 MED ORDER — PROMETHAZINE HCL 25 MG/ML IJ SOLN: 6.2500 mg | INTRAMUSCULAR | Status: DC | PRN

## 2013-04-14 MED ORDER — PROPOFOL 10 MG/ML IV BOLUS
INTRAVENOUS | Status: AC
  Filled 2013-04-14: qty 20

## 2013-04-14 MED ORDER — NEOSTIGMINE METHYLSULFATE 1 MG/ML IJ SOLN
INTRAMUSCULAR | Status: DC | PRN
  Administered 2013-04-14: 3 mg via INTRAVENOUS

## 2013-04-14 MED ORDER — ROCURONIUM BROMIDE 100 MG/10ML IV SOLN
INTRAVENOUS | Status: DC | PRN
  Administered 2013-04-14: 50 mg via INTRAVENOUS

## 2013-04-14 MED ORDER — BUPIVACAINE-EPINEPHRINE (PF) 0.5% -1:200000 IJ SOLN
INTRAMUSCULAR | Status: AC
Start: 2013-04-14 — End: 2013-04-14
  Filled 2013-04-14: qty 10.8

## 2013-04-14 MED ORDER — OXYCODONE HCL 5 MG/5ML PO SOLN: 5.0000 mg | Freq: Once | ORAL | Status: DC | PRN

## 2013-04-14 MED ORDER — LACTATED RINGERS IV SOLN
INTRAVENOUS | Status: DC
  Administered 2013-04-14: 08:00:00 via INTRAVENOUS

## 2013-04-14 MED ORDER — OXYMETAZOLINE HCL 0.05 % NA SOLN
NASAL | Status: DC | PRN
  Administered 2013-04-14: 2 via NASAL

## 2013-04-14 MED ORDER — METOPROLOL TARTRATE 1 MG/ML IV SOLN
INTRAVENOUS | Status: AC
  Filled 2013-04-14: qty 5

## 2013-04-14 MED ORDER — LACTATED RINGERS IV SOLN
INTRAVENOUS | Status: DC | PRN
  Administered 2013-04-14 (×3): via INTRAVENOUS

## 2013-04-14 MED ORDER — HYDROMORPHONE HCL PF 1 MG/ML IJ SOLN: 0.2500 mg | INTRAMUSCULAR | Status: DC | PRN

## 2013-04-14 MED ORDER — FENTANYL CITRATE 0.05 MG/ML IJ SOLN
INTRAMUSCULAR | Status: DC | PRN
  Administered 2013-04-14: 50 ug via INTRAVENOUS
  Administered 2013-04-14: 150 ug via INTRAVENOUS
  Administered 2013-04-14: 50 ug via INTRAVENOUS

## 2013-04-14 MED ORDER — LIDOCAINE HCL (CARDIAC) 20 MG/ML IV SOLN
INTRAVENOUS | Status: AC
  Filled 2013-04-14: qty 5

## 2013-04-14 MED ORDER — ARTIFICIAL TEARS OP OINT
TOPICAL_OINTMENT | OPHTHALMIC | Status: DC | PRN
  Administered 2013-04-14: 1 via OPHTHALMIC

## 2013-04-14 MED ORDER — PROPOFOL 10 MG/ML IV BOLUS
INTRAVENOUS | Status: DC | PRN
  Administered 2013-04-14: 50 mg via INTRAVENOUS
  Administered 2013-04-14: 100 mg via INTRAVENOUS
  Administered 2013-04-14: 200 mg via INTRAVENOUS
  Administered 2013-04-14: 50 mg via INTRAVENOUS

## 2013-04-14 MED ORDER — MIDAZOLAM HCL 5 MG/5ML IJ SOLN
INTRAMUSCULAR | Status: DC | PRN
  Administered 2013-04-14: 2 mg via INTRAVENOUS

## 2013-04-14 SURGICAL SUPPLY — 32 items
ALCOHOL 70% 16 OZ (MISCELLANEOUS) ×2 IMPLANT
ATTRACTOMAT 16X20 MAGNETIC DRP (DRAPES) ×2 IMPLANT
BLADE SURG 15 STRL LF DISP TIS (BLADE) ×2 IMPLANT
BLADE SURG 15 STRL SS (BLADE) ×4
COVER SURGICAL LIGHT HANDLE (MISCELLANEOUS) ×2 IMPLANT
CRADLE DONUT ADULT HEAD (MISCELLANEOUS) ×2 IMPLANT
GAUZE PACKING FOLDED 2  STR (GAUZE/BANDAGES/DRESSINGS) ×1
GAUZE PACKING FOLDED 2 STR (GAUZE/BANDAGES/DRESSINGS) ×1 IMPLANT
GAUZE SPONGE 4X4 16PLY XRAY LF (GAUZE/BANDAGES/DRESSINGS) ×4 IMPLANT
GLOVE BIOGEL PI IND STRL 6 (GLOVE) ×1 IMPLANT
GLOVE BIOGEL PI INDICATOR 6 (GLOVE) ×1
GLOVE SURG ORTHO 8.0 STRL STRW (GLOVE) ×2 IMPLANT
GLOVE SURG SS PI 6.0 STRL IVOR (GLOVE) ×2 IMPLANT
GOWN STRL REUS W/TWL 2XL LVL3 (GOWN DISPOSABLE) ×2 IMPLANT
HEMOSTAT SURGICEL .5X2 ABSORB (HEMOSTASIS) IMPLANT
KIT BASIN OR (CUSTOM PROCEDURE TRAY) ×2 IMPLANT
KIT ROOM TURNOVER OR (KITS) ×2 IMPLANT
MANIFOLD NEPTUNE WASTE (CANNULA) ×2 IMPLANT
NEEDLE BLUNT 16X1.5 OR ONLY (NEEDLE) ×2 IMPLANT
NS IRRIG 1000ML POUR BTL (IV SOLUTION) ×2 IMPLANT
PACK EENT II TURBAN DRAPE (CUSTOM PROCEDURE TRAY) ×2 IMPLANT
PAD ARMBOARD 7.5X6 YLW CONV (MISCELLANEOUS) ×4 IMPLANT
SPONGE SURGIFOAM ABS GEL SZ50 (HEMOSTASIS) ×2 IMPLANT
SUCTION FRAZIER TIP 10 FR DISP (SUCTIONS) ×4 IMPLANT
SUCTION FRAZIER TIP 8 FR DISP (SUCTIONS) ×1
SUCTION TUBE FRAZIER 8FR DISP (SUCTIONS) ×1 IMPLANT
SUT CHROMIC 3 0 PS 2 (SUTURE) ×8 IMPLANT
SUT CHROMIC 4 0 P 3 18 (SUTURE) ×2 IMPLANT
SYR 50ML SLIP (SYRINGE) ×2 IMPLANT
TOWEL OR 17X26 10 PK STRL BLUE (TOWEL DISPOSABLE) ×2 IMPLANT
TUBE CONNECTING 12X1/4 (SUCTIONS) ×2 IMPLANT
YANKAUER SUCT BULB TIP NO VENT (SUCTIONS) ×2 IMPLANT

## 2013-04-14 NOTE — Anesthesia Preprocedure Evaluation (Signed)
Anesthesia Evaluation  Patient identified by MRN, date of birth, ID band Patient awake    Reviewed: Allergy & Precautions, H&P , NPO status , Patient's Chart, lab work & pertinent test results  History of Anesthesia Complications Negative for: history of anesthetic complications  Airway Mallampati: II TM Distance: >3 FB Neck ROM: Full    Dental  (+) Teeth Intact, Chipped,    Pulmonary neg pulmonary ROS,  breath sounds clear to auscultation        Cardiovascular negative cardio ROS  Rhythm:Regular Rate:Normal     Neuro/Psych Anxiety Depression  Neuromuscular disease    GI/Hepatic Tonsillar ca,difficulty swallowing   Endo/Other  diabetes  Renal/GU      Musculoskeletal  (+) Fibromyalgia -  Abdominal (+) + obese,   Peds  Hematology  (+) Blood dyscrasia, ,   Anesthesia Other Findings Tonsillar ca right Had hemorrhage from tonsil due to friable tumor after last intubation  Reproductive/Obstetrics                           Anesthesia Physical Anesthesia Plan  ASA: III  Anesthesia Plan: General   Post-op Pain Management:    Induction: Intravenous  Airway Management Planned: Nasal ETT and Video Laryngoscope Planned  Additional Equipment:   Intra-op Plan:   Post-operative Plan: Extubation in OR  Informed Consent: I have reviewed the patients History and Physical, chart, labs and discussed the procedure including the risks, benefits and alternatives for the proposed anesthesia with the patient or authorized representative who has indicated his/her understanding and acceptance.     Plan Discussed with: CRNA and Surgeon  Anesthesia Plan Comments:         Anesthesia Quick Evaluation

## 2013-04-14 NOTE — Transfer of Care (Signed)
Immediate Anesthesia Transfer of Care Note  Patient: [name]  Procedure(s) Performed: Procedure(s): Extraction of tooth #'s 1,2,3,4,5,6,7,8,9,10,11,12,13,14,15,17,18,19,20,21,22,23,24,25,26,27,28,29, 30, 31, and 32 with alveoloplasty and bilateral mandibular tori reductions. (N/A)  Patient Location: PACU  Anesthesia Type:General  Level of Consciousness: sedated, patient cooperative and responds to stimulation  Airway & Oxygen Therapy: Patient Spontanous Breathing and Patient connected to face mask oxygen  Post-op Assessment: Report given to PACU RN and Post -op Vital signs reviewed and stable  Post vital signs: Reviewed and stable  Complications: No apparent anesthesia complications

## 2013-04-14 NOTE — Progress Notes (Signed)
1 view CXR noted from 04-04-13 Spoke with Dr Oletta Lamas to see if 2 view needed.  States that 1 view is ok, if 1 view was normal.

## 2013-04-14 NOTE — H&P (Signed)
04/14/2013  Patient:            Agostino Gorin [place] Date of Birth:  08/03/1950 MRN:                [id number]  Joyce Copa Petrides presented for multiple extractions with alveoloplasty and gross debridement of remaining dentition in the operating room with general anesthesia. Patient indicates he now wishes to proceed with removal of all remaining teeth with alveoloplasty and pre-prosthetic surgery as needed. Patient denies having any significant medical or dental changes since he was discharged from the hospital last week. Patient did have some hemorrhage from the tonsillar mass during last intubation. Patient was evaluated preoperatively by the anesthesia team.  Please see H&P note dictated from Dr. Rosendo Gros and 03/31/2013 to use as H&P for the dental operating room procedure. Lenn Cal, DDS  Patient ID: Joyce Copa Mcinroy, male   DOB: 07-Sep-1950, 63 y.o.   MRN: [id number]    Chief Complaint   Patient presents with   .  Other       Eval PAC and peg tube placement        HPI DIEM PAGNOTTA is a 63 y.o. male.  The patient is a 63 year old male who is referred by Dr. Alvy Bimler for evaluation of a Port-A-Cath placement as well as G-tube. The patient has a history of tonsillar squamous cell cancer. The patient is to undergo chemotherapy as well as radiation therapy. According to the patient and his wife chemotherapies Is supposed to start on April 10 potentially.     HPI    Past Medical History   Diagnosis  Date   .  Diabetes mellitus without complication     .  Pneumonia     .  Chest pain         10/14   .  Urethral stricture         s/p dilitation   .  Neuropathy         compression neuropathy right hip;s/p replacement   .  Cancer         tonsil ca   .  Allergy     .  Fibromyalgia           Past Surgical History   Procedure  Laterality  Date   .  Left hip replacement  Right     .  Cystoscopy             Family History   Problem  Relation  Age of Onset   .  Hyperlipidemia  Brother     .   Cancer  Mother         Deceased with leukemia, had uterine ca   .  Cancer  Cousin         living brain cancer, male        Social History History   Substance Use Topics   .  Smoking status:  Never Smoker    .  Smokeless tobacco:  Never Used   .  Alcohol Use:  No         Allergies   Allergen  Reactions   .  Neurontin [Gabapentin]  Other (See Comments)       Causes seizures         Current Outpatient Prescriptions   Medication  Sig  Dispense  Refill   .  acetaminophen (TYLENOL) 500 MG tablet  Take 500 mg by mouth every 6 (six) hours as needed for pain.         [name]  alprazolam (XANAX) 2 MG tablet  Take 2 mg by mouth 3 (three) times daily as needed for sleep.         .  Cholecalciferol (VITAMIN D3) 5000 UNITS CAPS  Take 5,000 Units by mouth daily.         .  citalopram (CELEXA) 20 MG tablet  Take 20 mg by mouth daily.         [name]  docusate sodium (COLACE) 100 MG capsule  Take 100 mg by mouth 2 (two) times daily as needed for mild constipation.         .  metFORMIN (GLUCOPHAGE) 500 MG tablet  Take 500 mg by mouth 2 (two) times daily with a meal.         .  OxyCODONE (OXYCONTIN) 10 mg T12A 12 hr tablet  Take 10 mg by mouth every 12 (twelve) hours.         [name]  oxyCODONE-acetaminophen (PERCOCET) 10-325 MG per tablet  Take 1-2 tablets by mouth every 4 (four) hours as needed for pain.          .  pregabalin (LYRICA) 150 MG capsule  Take 150 mg by mouth. Take 2 tablets twice a day.             No current facility-administered medications for this visit.        Review of Systems Review of Systems  Constitutional: Negative.   HENT: Negative.   Eyes: Negative.   Respiratory: Negative.   Cardiovascular: Negative.   Gastrointestinal: Negative.   Endocrine: Negative.   Neurological: Negative.       Blood pressure 124/84, pulse 82, resp. rate 18, height 6\' 4"  (1.93 m), weight 255 lb (115.667 kg).   Physical Exam Physical Exam  Constitutional: He is oriented to person,  place, and time. He appears well-developed and well-nourished.  HENT:   Head: Normocephalic and atraumatic.  Eyes: Conjunctivae and EOM are normal. Pupils are equal, round, and reactive to light.  Neck: Normal range of motion. Neck supple.  Cardiovascular: Normal rate, regular rhythm and normal heart sounds.   Pulmonary/Chest: Effort normal and breath sounds normal.  Abdominal: Soft. Bowel sounds are normal. He exhibits no distension and no mass. There is no tenderness. There is no rebound and no guarding.  Musculoskeletal: Normal range of motion.  Neurological: He is alert and oriented to person, place, and time.  Skin: Skin is warm and dry.      Data Reviewed none   Assessment    63 year old male with tonsillar cancer to begin chemoradiation therapy.      Plan    1. We'll proceed to the operating room for a right Port-A-Cath placement as the patient is left-handed, as well as a laparoscopic T-tube placement. 2. I discussed the risks and benefits to the patient's wife to include but not limited to: Infection, bleeding, damage to surrounding structures, possible need for further surgery and/or procedures, and/or possible pneumothorax. Patient voiced understanding and wishes to proceed.      Rosario Jacks., [place] 03/31/2013, 9:52 AM

## 2013-04-14 NOTE — Op Note (Signed)
Patient:            [name] Date of Birth:  [birth year] MRN:                [id number]   DATE OF PROCEDURE:  04/14/2013               OPERATIVE REPORT   PREOPERATIVE DIAGNOSES: 1. Squamous cell carcinoma of the right tonsil 2. Pre-chemoradiation therapy dental protocol 3. Chronic apical periodontitis 4. Multiple retained root segments 5. Dental caries 6. Bilateral mandibular lingual tori 7. Lateral exostoses  POSTOPERATIVE DIAGNOSES: 1. Squamous cell carcinoma of the right tonsil 2. Pre-chemoradiation therapy dental protocol 3. Chronic apical periodontitis 4. Multiple retained root segments 5. Dental caries 6. Bilateral mandibular lingual tori 7. Lateral exostoses  OPERATIONS: 1. Multiple extraction of tooth numbers 1, 2, 3, 4, 5, 6, 7, 8, 9, 10, 11, 12, 13, 14, 15, 17, 18, 19, 20, 21, 22, 23, 24, 25, 26, 27, 28, 29, 30, 31, and 32. 2. 4 Quadrants of alveolo 3. Bilateral mandibular lingual tori reductions 4. Bilateral buccal exostoses reductions    SURGEON: Lenn Cal, DDS  ASSISTANT: Camie Patience, (dental assistant)  ANESTHESIA: General anesthesia via nasoendotracheal tube.  MEDICATIONS: 1. Ancef 2 g IV prior to invasive dental procedures. 2. Local anesthesia with a total utilization of 6 carpules each containing 34 mg of lidocaine with 0.017 mg of epinephrine as well as 2 carpules each containing 9 mg of bupivacaine with 0.009 mg of epinephrine.  SPECIMENS: There are 31 teeth that were discarded.  DRAINS: None  CULTURES: None  COMPLICATIONS: Soft tissue tear #26-28 in area of lateral exostoses   ESTIMATED BLOOD LOSS: 150 mLs.  INTRAVENOUS FLUIDS: 2000 mL of Lactated ringers solution.  INDICATIONS: The patient was recently diagnosed with squamous carcinoma of the right tonsil.  A dental consultation was then requested prior to anticipated chemoradiation therapy.  The patient was examined and treatment planned for multiple extractions with  alveoloplasty and pre-prosthetic surgery as indicated in the operating room with general anesthesia. Initially, the patient wanted to keep some remaining lower teeth, however, prior to the surgery, the patient wished to proceed with extraction of all remaining teeth with alveoloplasty and pre-prosthetic surgery as needed.  This treatment plan was formulated to decrease the risks and complications associated with dental infection from affecting the patient's systemic health and to prevent future complications such as infection and osteoradionecrosis. This will also assist in future prosthodontic rehabilitation for the patient.  OPERATIVE FINDINGS: Patient was examined operating room number 8.  The teeth were identified for extraction. The patient was noted be affected by chronic periodontitis,  chronic apical periodontitis, multiple dental caries, multiple retained root segments, bilateral mandibular lingual tori, and lateral exostoses involving the mandibular left and mandibular right quadrants .   DESCRIPTION OF PROCEDURE: Patient was brought to the main operating room number 8. Patient was then placed in the supine position on the operating table.  General  anesthesia was then induced per the anesthesia team. The patient was then prepped and draped in the usual manner for dental medicine procedure. A timeout was performed. The patient was identified and procedures were verified. A throat pack was  carefully placed at this time to avoid impingement on the tonsillar tumor and lead to hemorrhage. The oral cavity was then thoroughly examined with the findings noted above. The patient was then ready for dental medicine procedure as follows:  Local anesthesia was then administered sequentially with a  total utilization of  6 carpules each containing 34 mg of lidocaine with 0.017 mg of epinephrine as well as  2 carpules  each containing 9 mg bupivacaine with 0.009 mg of epinephrine.  The Maxillary left and  right quadrants first approached. Anesthesia was then delivered utilizing infiltration with lidocaine with epinephrine. A #15 blade incision was then made from the  maxillary right tuberosity and extended to the maxillary left tuberosity.  A  surgical flap was then carefully reflected. Appropriate amounts of buccal and interseptal bone were then removed around all the teeth utilizing a surgical handpiece and bur and copious amounts of sterile water.  The teeth were then subluxated with a series of straight elevators. Tooth numbers 1, 2, and 3 were then removed with a 53R forceps leaving roots in the area of tooth numbers 2 and 3. Tooth numbers 4, 5, 6, 7, 8, 9, 10, 11, and 12 were then removed with a 150 forceps without complications.  Further bone was then removed around the roots associated with tooth numbers 2, 3, 14, and 15. These roots were then elevated out with a series of cryers elevators without complication. Alveoloplasty was then performed utilizing a ronguers and bone file. The surgical site was then irrigated with copious amounts of sterile saline. The tissues were approximated and trimmed appropriately.  A piece of Surgifoam was placed the extraction socket numbers 1, 2, 3, 14, and 15.The  maxillary right surgical site was then closed from the maxillary tuberosity and extended to the mesial 8 utilizing 3-0 chromic gut suture in a continuous interrupted suture technique x1. The maxillary left surgical site was then closed and the maxillary left tuberosity and extended to the mesial #9 utilizing 3-0 chromic gut suture in a continuous interrupted suture technique x1.  At this point time, the mandibular quadrants were approached. The patient was given bilateral inferior alveolar nerve blocks and long buccal nerve blocks utilizing the bupivacaine with epinephrine. Further infiltration was then achieved utilizing the lidocaine with epinephrine. A 15 blade incision was then made from the distal of number   17 and extended to the distal of #32 .  A surgical flap was then carefully reflected.  At this point time the flap was further reflected to expose the lateral exostoses in the area of tooth numbers 20 through 22 and 26-28.  A soft tissue tear was then noted in the area of #26-28 .The lateral exostoses were then reduced utilizing a rongeur and bone file.  Appropriate amounts of buccal and interseptal bone were then removed utilizing a surgical handpiece and copious amount of sterile water.  The mandibular teeth were then subluxated with a series of straight elevators. Tooth numbers  20, 21, 22, 23, 24, 25, 26, 27, 28, 29 were then removed with a 151 forceps without complications.   Tooth numbers 17, 18, 19 were then approached with a 23 forceps. This resulted in the coronal removal of tooth numbers 17, 18, 19 .  Further bone was then removed around utilizing a surgical handpiece and bur and copious amounts sterile water. The roots were then elevated out with a series of cryers elevators. Alveoloplasty was then performed utilizing a rongeur and bone file.   Tooth numbers 30, 31, and 32 were then approached and the coronal aspects were then removed utilizing a 17 forceps . The retained roots were then approached and additional bone was removed with a surgical handpiece and bur and copious amounts sterile water. The roots were then elevated out  with a series of cryers elevators without complication.  At this point time the bilateral manipulative lingual tori were visualized and removed utilizing a surgical handpiece and bur and copious amounts of sterile water.  Alveoloplasty was performed again utilizing a rongeur and bone file.  The tissues were approximated and trimmed appropriately. The surgical sites were then irrigated with copious amounts of sterile saline times 4 .  A piece of Surgifoam was placed the extraction sockets of tooth numbers 17, 18, 19, 30, 31, and 32 .  The mandibular left surgical site was then  closed from the distal of  #17 and extended the mesial numbers 24 utilizing 3-0 chromic gut suture in a continuous interrupted suture technique x1. The mandibular right surgical site was then closed from the distal of #32 and extended to the mesial numbers 25 utilizing 3-0 chromic gut suture in a continuous interrupted suture technique x1.  Additional 4-0 chromic gut sutures were then placed in an interrupted fashion to close the soft tissue tear in the area of tooth numbers 26-28.  At this point time, the entire mouth was irrigated with copious amounts of sterile saline. The patient was examined for further complications, seeing none, the dental medicine procedure was deemed to be complete. The throat pack was carefully removed at this time. A series of 4 x 4 gauze were placed in the mouth to aid hemostasis. The patient was then handed over to the anesthesia team for final disposition. After an appropriate amount of time, the patient was extubated and taken to the postanesthsia care unit with stable vital signs and a good condition. All counts were correct for the dental medicine procedure. The patient will be  monitored postoperatively for appropriate oxygenation levels and discharged as indicated. The patient has sufficient pain medication by report of patient and wife. Patient is to return to dental medicine in approximately 7-10 days for evaluation for suture removal.   Lenn Cal, DDS.

## 2013-04-14 NOTE — Anesthesia Postprocedure Evaluation (Signed)
  Anesthesia Post-op Note  Patient: [name]  Procedure(s) Performed: Procedure(s): Extraction of tooth #'s 1,2,3,4,5,6,7,8,9,10,11,12,13,14,15,17,18,19,20,21,22,23,24,25,26,27,28,29, 30, 31, and 32 with alveoloplasty and bilateral mandibular tori reductions. (N/A)  Patient Location: PACU  Anesthesia Type:General  Level of Consciousness: awake and alert   Airway and Oxygen Therapy: Patient Spontanous Breathing  Post-op Pain: mild  Post-op Assessment: Post-op Vital signs reviewed, Patient's Cardiovascular Status Stable, Respiratory Function Stable, Patent Airway, No signs of Nausea or vomiting and Pain level controlled  Post-op Vital Signs: Reviewed and stable  Complications: No apparent anesthesia complications

## 2013-04-14 NOTE — Discharge Instructions (Signed)
MOUTH CARE AFTER SURGERY ° °FACTS: °· Ice used in ice bag helps keep the swelling down, and can help lessen the pain. °· It is easier to treat pain BEFORE it happens. °· Spitting disturbs the clot and may cause bleeding to start again, or to get worse. °· Smoking delays healing and can cause complications. °· Sharing prescriptions can be dangerous.  Do not take medications not recently prescribed for you. °· Antibiotics may stop birth control pills from working.  Use other means of birth control while on antibiotics. °· Warm salt water rinses after the first 24 hours will help lessen the swelling:  Use 1/2 teaspoonful of table salt per oz.of water. ° °DO NOT: °· Do not spit.  Do not drink through a straw. °· Strongly advised not to smoke, dip snuff or chew tobacco at least for 3 days. °· Do not eat sharp or crunchy foods.  Avoid the area of surgery when chewing. °· Do not stop your antibiotics before your instructions say to do so. °· Do not eat hot foods until bleeding has stopped.  If you need to, let your food cool down to room temperature. ° °EXPECT: °· Some swelling, especially first 2-3 days. °· Soreness or discomfort in varying degrees.  Follow your dentist's instructions about how to handle pain before it starts. °· Pinkish saliva or light blood in saliva, or on your pillow in the morning.  This can last around 24 hours. °· Bruising inside or outside the mouth.  This may not show up until 2-3 days after surgery.  Don't worry, it will go away in time. °· Pieces of "bone" may work themselves loose.  It's OK.  If they bother you, let us know. ° °WHAT TO DO IMMEDIATELY AFTER SURGERY: °· Bite on the gauze with steady pressure for 1-2 hours.  Don't chew on the gauze. °· Do not lie down flat.  Raise your head support especially for the first 24 hours. °· Apply ice to your face on the side of the surgery.  You may apply it 20 minutes on and a few minutes off.  Ice for 8-12 hours.  You may use ice up to 24  hours. °· Before the numbness wears off, take a pain pill as instructed. °· Prescription pain medication is not always required. ° °SWELLING: °· Expect swelling for the first couple of days.  It should get better after that. °· If swelling increases 3 days or so after surgery; let us know as soon as possible. ° °FEVER: °· Take Tylenol every 4 hours if needed to lower your temperature, especially if it is at 100F or higher. °· Drink lots of fluids. °· If the fever does not go away, let us know. ° °BREATHING TROUBLE: °· Any unusual difficulty breathing means you have to have someone bring you to the emergency room ASAP ° °BLEEDING: °· Light oozing is expected for 24 hours or so. °· Prop head up with pillows °· Avoid spitting °· Do not confuse bright red fresh flowing blood with lots of saliva colored with a little bit of blood. °· If you notice some bleeding, place gauze or a tea bag where it is bleeding and apply CONSTANT pressure by biting down for 1 hour.  Avoid talking during this time.  Do not remove the gauze or tea bag during this hour to "check" the bleeding. °· If you notice bright RED bleeding FLOWING out of particular area, and filling the floor of your mouth, put   a wad of gauze on that area, bite down firmly and constantly.  Call us immediately.  If we're closed, have someone bring you to the emergency room.  ORAL HYGIENE:  Brush your teeth as usual after meals and before bedtime.  Use a soft toothbrush around the area of surgery.  DO NOT AVOID BRUSHING.  Otherwise bacteria(germs) will grow and may delay healing or encourage infection.  Since you cannot spit, just gently rinse and let the water flow out of your mouth.  DO NOT SWISH HARD.  EATING:  Cool liquids are a good point to start.  Increase to soft foods as tolerated.  PRESCRIPTIONS:  Follow the directions for your prescriptions exactly as written.  If Dr. Enrique Sack gave you a narcotic pain medication, do not drive, operate  machinery or drink alcohol when on that medication.  QUESTIONS:  Call our office during office hours [phone] or call the Emergency Room at [phone].     What to Eat after Tooth extraction:     For your first meals, you should eat lightly; only small meals at first.   Avoid Sharp, Crunchy, and Hot foods.   If you do not have nausea, you may eat larger meals.  Avoid spicy, greasy and heavy food, as these may make you sick after the anesthesia.    General Anesthesia, Adult, Care After  Refer to this sheet in the next few weeks. These instructions provide you with information on caring for yourself after your procedure. Your health care provider may also give you more specific instructions. Your treatment has been planned according to current medical practices, but problems sometimes occur. Call your health care provider if you have any problems or questions after your procedure.  WHAT TO EXPECT AFTER THE PROCEDURE  After the procedure, it is typical to experience:  Sleepiness.  Nausea and vomiting. HOME CARE INSTRUCTIONS  For the first 24 hours after general anesthesia:  Have a responsible person with you.  Do not drive a car. If you are alone, do not take public transportation.  Do not drink alcohol.  Do not take medicine that has not been prescribed by your health care provider.  Do not sign important papers or make important decisions.  You may resume a normal diet and activities as directed by your health care provider.  Change bandages (dressings) as directed.  If you have questions or problems that seem related to general anesthesia, call the hospital and ask for the anesthetist or anesthesiologist on call. SEEK MEDICAL CARE IF:  You have nausea and vomiting that continue the day after anesthesia.  You develop a rash. SEEK IMMEDIATE MEDICAL CARE IF:  You have difficulty breathing.  You have chest pain.  You have any allergic problems. Document Released: 04/07/2000  Document Revised: 09/01/2012 Document Reviewed: 07/15/2012  [facility] Patient Information 2014 [place], [place].

## 2013-04-14 NOTE — Progress Notes (Signed)
PRE-OPERATIVE NOTE:  04/14/2013 [name] [id number]  VITALS: BP 162/78  Pulse 85  Temp(Src) 99.1 F (37.3 C) (Oral)  Resp 18  Wt 255 lb (115.667 kg)  SpO2 96%  Lab Results  Component Value Date   WBC 8.8 04/04/2013   HGB 15.0 04/04/2013   HCT 43.9 04/04/2013   MCV 89.8 04/04/2013   PLT 171 04/04/2013   BMET    Component Value Date/Time   NA 141 04/04/2013 0732   K 3.9 04/04/2013 0732   CL 103 04/04/2013 0732   CO2 25 04/04/2013 0732   GLUCOSE 136* 04/04/2013 0732   BUN 12 04/04/2013 0732   CREATININE 0.72 04/04/2013 0732   CALCIUM 9.3 04/04/2013 0732   GFRNONAA >90 04/04/2013 0732   GFRAA >90 04/04/2013 0732    No results found for this basename: INR, PROTIME   No results found for this basename: PTT     Nghia M Ornstein presents for multiple dental extraction of remaining teeth with alveoloplasty and pre-prosthetic surgery as needed in the OR with general anesthesia. OP permit was changed to reflect change to removal of all teeth at this time.    SUBJECTIVE: The patient denies any acute medical or dental changes and agrees to proceed with treatment as stated above.  EXAM: No sign of acute dental changes.  ASSESSMENT: Patient is affected by  chronic apical periodontitis, multiple retained root segments, dental caries, chronic periodontitis, accretions and mandibular tori.  PLAN: Patient agrees to proceed with treatment as planned in the operating room as discussed and accepts the risks, benefits, complications of the proposed treatment. We discussed the risks for bleeding, bruising, swelling, infection, nerve damage, sinus problems, root tip fracture, mandible fracture, soft tissue damage especially around tori, anesthesia complications, airway compromise, and potential risk for bleeding from the tonsillar mass again. We discussed potential need for admission for 23 hour observation if indicated. Patient is aware of other potential complications not mentioned above.   Lenn Cal, DDS

## 2013-04-14 NOTE — Addendum Note (Signed)
Addendum created 04/14/13 1502 by Leda Quail, MD   Modules edited: Orders, PRL Based Order Sets

## 2013-04-15 ENCOUNTER — Other Ambulatory Visit: Payer: Self-pay | Admitting: *Deleted

## 2013-04-18 ENCOUNTER — Encounter (INDEPENDENT_AMBULATORY_CARE_PROVIDER_SITE_OTHER): Payer: 59 | Admitting: General Surgery

## 2013-04-18 ENCOUNTER — Encounter (HOSPITAL_COMMUNITY): Payer: Self-pay | Admitting: Dentistry

## 2013-04-18 ENCOUNTER — Other Ambulatory Visit: Payer: Self-pay | Admitting: Hematology and Oncology

## 2013-04-18 ENCOUNTER — Telehealth: Payer: Self-pay | Admitting: Hematology and Oncology

## 2013-04-18 NOTE — Telephone Encounter (Signed)
S/w the pt's wife and she is aware of the April appts.

## 2013-04-19 ENCOUNTER — Telehealth: Payer: Self-pay | Admitting: *Deleted

## 2013-04-19 NOTE — Telephone Encounter (Signed)
Called pt to check on him s/p 4/2 extractions; spoke with wife.  She stated he is doing well, reports no pain, experiencing no swelling at this point (they applied frequent ice packs after extractions), drinking ca. 4 Boost/d.  We discussed tomorrow's 9:15 IV Start and 10:00 SIM appt, 4/16 1:00 appt with Dr. Alvy Bimler, need for chemo ed class.  I arranged chemo class for 4/16 @ 10:00, later communicated this to wife.   Gayleen Orem, RN, BSN, [facility] Head & Neck Oncology Navigator [phone]

## 2013-04-20 ENCOUNTER — Ambulatory Visit
Admission: RE | Admit: 2013-04-20 | Discharge: 2013-04-20 | Disposition: A | Discharge status: Home / Self Care | Payer: 59 | Source: Ambulatory Visit | Attending: Radiation Oncology | Admitting: Radiation Oncology

## 2013-04-20 ENCOUNTER — Encounter: Payer: Self-pay | Admitting: Radiation Oncology

## 2013-04-20 ENCOUNTER — Encounter: Payer: Self-pay | Admitting: *Deleted

## 2013-04-20 ENCOUNTER — Telehealth: Payer: Self-pay | Admitting: *Deleted

## 2013-04-20 VITALS — BP 140/89 | HR 94 | Temp 98.5°F | Ht 76.0 in | Wt 251.1 lb

## 2013-04-20 DIAGNOSIS — C099 Malignant neoplasm of tonsil, unspecified: Secondary | ICD-10-CM

## 2013-04-20 DIAGNOSIS — K1230 Oral mucositis (ulcerative), unspecified: Secondary | ICD-10-CM

## 2013-04-20 DIAGNOSIS — K121 Other forms of stomatitis: Secondary | ICD-10-CM | POA: Insufficient documentation

## 2013-04-20 DIAGNOSIS — Y842 Radiological procedure and radiotherapy as the cause of abnormal reaction of the patient, or of later complication, without mention of misadventure at the time of the procedure: Secondary | ICD-10-CM | POA: Insufficient documentation

## 2013-04-20 DIAGNOSIS — L988 Other specified disorders of the skin and subcutaneous tissue: Secondary | ICD-10-CM | POA: Insufficient documentation

## 2013-04-20 DIAGNOSIS — R599 Enlarged lymph nodes, unspecified: Secondary | ICD-10-CM | POA: Insufficient documentation

## 2013-04-20 DIAGNOSIS — Z931 Gastrostomy status: Secondary | ICD-10-CM | POA: Insufficient documentation

## 2013-04-20 DIAGNOSIS — Z510 Encounter for antineoplastic radiation therapy: Secondary | ICD-10-CM | POA: Insufficient documentation

## 2013-04-20 DIAGNOSIS — K117 Disturbances of salivary secretion: Secondary | ICD-10-CM | POA: Insufficient documentation

## 2013-04-20 MED ORDER — SODIUM CHLORIDE 0.9 % IJ SOLN
10.0000 mL | Freq: Once | INTRAMUSCULAR | Status: AC
  Administered 2013-04-20: 10 mL via INTRAVENOUS

## 2013-04-20 MED ORDER — HEPARIN SOD (PORK) LOCK FLUSH 100 UNIT/ML IV SOLN
500.0000 [IU] | Freq: Once | INTRAVENOUS | Status: AC
  Administered 2013-04-20: 500 [IU] via INTRAVENOUS

## 2013-04-20 NOTE — Progress Notes (Signed)
Mr. Lipa here today for simulation.  His portacath was accessed without any difficulty and with brisk blood return.  Area covered with clear dressing and secured.  He denies any pain during insertion. Transported patient and daughter to simulation.  Time -out conducted in the prescence of this RN.  Note bruising of anterior neck and chin area s/p removal of all teeth by Dr. Teena Dunk on 04/14/13.

## 2013-04-20 NOTE — Telephone Encounter (Signed)
Per staff message and POF I have scheduled appts.  JMW  

## 2013-04-20 NOTE — Addendum Note (Signed)
Encounter addended by: Deirdre Evener, RN on: 04/20/2013 12:18 PM<BR>     Documentation filed: Orders, Inpatient MAR

## 2013-04-20 NOTE — Progress Notes (Signed)
To provide support and care continuity, met with pt and his dtr prior to [facility].  They did not express and needs or concerns; I encourage them to call me if that changes.  Continuing navigation as L1 patient (new patient).  Gayleen Orem, RN, BSN, [facility] Head & Neck Oncology Navigator [phone]

## 2013-04-20 NOTE — Progress Notes (Signed)
Simulation, IMRT treatment planning, and Special treatment procedure note   outpatient  Diagnosis: head and neck cancer, right tonsil  The patient was taken to the CT simulator and laid in the supine position on the table. An Aquaplast head and shoulder mask was custom fitted to the patient's anatomy. High-resolution CT axial imaging was obtained of the head and neck with contrast. I verified that the quality of the imaging is good for treatment planning. 1 Medically Necessary Treatment Device was fabricated and supervised by me: Aquaplast mask.   Treatment planning note I plan to treat the patient with helical Tomotherapy, IMRT. I plan to treat the patient's tumor and bilateral neck nodes. I plan to treat to a total dose of 70 Gray in 35  fractions   IMRT planning Note  IMRT is an important modality to deliver adequate dose to the patient's at risk tissues while sparing the patient's normal structures, including the: esophagus, parotid tissue, mandible, brain stem, spinal cord, oral cavity, brachial plexus.  This justifies the use of IMRT in the patient's treatment.   Special Treatment Procedure Note:  The patient will be receiving chemotherapy concurrently. Chemotherapy heightens the risk of side effects. I have considered this during the patient's treatment planning process and will monitor the patient accordingly for side effects on a weekly basis. Concurrent chemotherapy increases the complexity of this patient's treatment and therefore this constitutes a special treatment procedure.  -----------------------------------  Eppie Gibson, MD

## 2013-04-22 ENCOUNTER — Other Ambulatory Visit: Payer: Self-pay | Admitting: Hematology and Oncology

## 2013-04-22 ENCOUNTER — Telehealth: Payer: Self-pay | Admitting: *Deleted

## 2013-04-22 ENCOUNTER — Other Ambulatory Visit: Payer: Self-pay | Admitting: *Deleted

## 2013-04-22 ENCOUNTER — Ambulatory Visit: Payer: 59

## 2013-04-22 DIAGNOSIS — C099 Malignant neoplasm of tonsil, unspecified: Secondary | ICD-10-CM

## 2013-04-22 NOTE — Progress Notes (Signed)
To provide support and encouragement, met with patient and his dtr during [facility].  Patient denied any needs at this time.  I encourage him to call me if that changes. He verbalized understanding.  Gayleen Orem, RN, BSN, [facility] Head & Neck Oncology Navigator [phone]

## 2013-04-22 NOTE — Telephone Encounter (Signed)
Spoke with patient's wife.  Informed her that patient's labs are being rescheduled for 4/16, same day as his appt with Dr. Alvy Bimler.  She verbalized understanding.  Gayleen Orem, RN, BSN, [facility] Head & Neck Oncology Navigator [phone]

## 2013-04-22 NOTE — Telephone Encounter (Signed)
Pt dtr called to reschedule today's lab to coincide with next Monday's appt with Dr. Enrique Sack.  Dr. Alvy Bimler informed.  Gayleen Orem, RN, BSN, [facility] Head & Neck Oncology Navigator [phone]

## 2013-04-25 ENCOUNTER — Encounter (HOSPITAL_COMMUNITY): Payer: Self-pay | Admitting: Dentistry

## 2013-04-25 ENCOUNTER — Ambulatory Visit (HOSPITAL_COMMUNITY): Payer: Medicaid - Dental | Admitting: Dentistry

## 2013-04-25 VITALS — BP 136/85 | HR 84 | Temp 98.0°F

## 2013-04-25 DIAGNOSIS — K08109 Complete loss of teeth, unspecified cause, unspecified class: Secondary | ICD-10-CM

## 2013-04-25 DIAGNOSIS — Z0189 Encounter for other specified special examinations: Secondary | ICD-10-CM

## 2013-04-25 DIAGNOSIS — K08199 Complete loss of teeth due to other specified cause, unspecified class: Secondary | ICD-10-CM

## 2013-04-25 DIAGNOSIS — K000 Anodontia: Secondary | ICD-10-CM

## 2013-04-25 DIAGNOSIS — C099 Malignant neoplasm of tonsil, unspecified: Secondary | ICD-10-CM

## 2013-04-25 NOTE — Patient Instructions (Addendum)
PLAN: 1. Continue salt water rinses as needed to aid healing. 2. Maintain nutrition as per dietary consult. 3. Rinse after meals and at bedtime. Brush tongue daily. 4. Use multiple sips of water for dry mouth. 5. Return to clinic for periodic oral examination during radiation therapy as scheduled. 6. Call head and neck navigator or Dr. Alvy Bimler to confirm schedule of chemotherapy. 7. Call if other dental problems arise.  Lenn Cal, DDS   TRISMUS  Trismus is a condition where the jaw does not allow the mouth to open as wide as it usually does.  This can happen almost suddenly, or in other cases the process is so slow, it is hard to notice it-until it is too far along.  When the jaw joints and/or muscles have been exposed to radiation treatments, the onset of Trismus is very slow.  This is because the muscles are losing their stretching ability over a long period of time, as long as 2 YEARS after the end of radiation.  It is therefore important to exercise these muscles and joints.  TRISMUS EXERCISES   Stack of tongue depressors measuring the same or a little less than the last documented MIO (Maximum Interincisal Opening).  Secure them with a rubber band on both ends.  Place the stack in the patient's mouth, supporting the other end.  Allow 30 seconds for muscle stretching.  Rest for a few seconds.  Repeat 3-5 times  For all radiation patients, this exercise is recommended in the mornings and evenings unless otherwise instructed.  The exercise should be done for a period of 2 YEARS after the end of radiation.  MIO should be checked routinely on recall dental visits by the general dentist or the hospital dentist.  The patient is advised to report any changes, soreness, or difficulties encountered when doing the exercises.

## 2013-04-25 NOTE — Progress Notes (Signed)
POST OPERATIVE NOTE:  04/25/2013 Joyce Copa Potier [id number]  VITALS: BP 136/85  Pulse 84  Temp(Src) 98 F (36.7 C) (Oral)  LABS:  Lab Results  Component Value Date   WBC 8.8 04/04/2013   HGB 15.0 04/04/2013   HCT 43.9 04/04/2013   MCV 89.8 04/04/2013   PLT 171 04/04/2013   BMET    Component Value Date/Time   NA 141 04/04/2013 0732   K 3.9 04/04/2013 0732   CL 103 04/04/2013 0732   CO2 25 04/04/2013 0732   GLUCOSE 136* 04/04/2013 0732   BUN 12 04/04/2013 0732   CREATININE 0.72 04/04/2013 0732   CALCIUM 9.3 04/04/2013 0732   GFRNONAA >90 04/04/2013 0732   GFRAA >90 04/04/2013 0732    No results found for this basename: INR, PROTIME   No results found for this basename: PTT     Micky M Jakel is status post extraction of all remaining teeth with alveoloplasty and pre-prosthetic surgery as indicated in the OR on 04/14/2013.  SUBJECTIVE: Patient with minimal oral discomfort. Patient has a few stitches that remain. Patient is eating well and is using 4-5 cans of Boost per day.  EXAM: There is no sign of infection, heme, or ooze. A few sutures are loosely intact. Generalized primary closure is noted. A soft tissue tear in the area of tooth numbers 26 through 28 has healed in very well.  Patient is now edentulous.  PROCEDURE: The patient was given a chlorhexidine gluconate rinse for 30 seconds. Sutures were then removed without complication. Patient tolerated the procedure well.  ASSESSMENT: Post operative course is consistent with dental procedures performed in the OR on 04/14/13. Area of soft tissue tear in the area of #26-28 has healed in well with no complications.   PLAN: 1. Continue salt water rinses as needed to aid healing. 2. Maintain nutrition as per dietary consult. 3. Rinse after meals and at bedtime. Brush tongue daily. 4. Use multiple sips of water for dry mouth. 5. Return to clinic for periodic oral examination during radiation therapy as scheduled. 6. Call head and  neck navigator or Dr. Alvy Bimler to confirm schedule of chemotherapy. 7. Call if other dental problems arise.  Lenn Cal, DDS

## 2013-04-26 ENCOUNTER — Telehealth: Payer: Self-pay | Admitting: Hematology and Oncology

## 2013-04-26 ENCOUNTER — Ambulatory Visit (INDEPENDENT_AMBULATORY_CARE_PROVIDER_SITE_OTHER): Payer: 59 | Admitting: General Surgery

## 2013-04-26 ENCOUNTER — Encounter (INDEPENDENT_AMBULATORY_CARE_PROVIDER_SITE_OTHER): Payer: Self-pay | Admitting: General Surgery

## 2013-04-26 VITALS — BP 130/85 | HR 84 | Temp 95.8°F | Resp 16 | Ht 76.0 in | Wt 253.0 lb

## 2013-04-26 DIAGNOSIS — Z9889 Other specified postprocedural states: Secondary | ICD-10-CM

## 2013-04-26 NOTE — Progress Notes (Signed)
Patient ID: [name], male   DOB: Dec 20, 1950, 63 y.o.   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 Patient is status post Port-A-Cath placement as well as gastric feeding tube placement.  On Exam: Wounds are clean dry and intact. Gastric tube in place.   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 1. Patient can return as needed   Ralene Ok, MD [facility] General & Minimally Invasive Surgery Trauma & Emergency Surgery

## 2013-04-26 NOTE — Telephone Encounter (Signed)
added lb for 4/16 @ 12:30 pm. lmonvm pt. 1st appt still 10am.

## 2013-04-28 ENCOUNTER — Other Ambulatory Visit (HOSPITAL_BASED_OUTPATIENT_CLINIC_OR_DEPARTMENT_OTHER): Payer: 59

## 2013-04-28 ENCOUNTER — Encounter: Payer: Self-pay | Admitting: *Deleted

## 2013-04-28 ENCOUNTER — Telehealth: Payer: Self-pay | Admitting: Hematology and Oncology

## 2013-04-28 ENCOUNTER — Ambulatory Visit (HOSPITAL_BASED_OUTPATIENT_CLINIC_OR_DEPARTMENT_OTHER): Payer: 59 | Admitting: Hematology and Oncology

## 2013-04-28 ENCOUNTER — Other Ambulatory Visit: Payer: 59

## 2013-04-28 VITALS — BP 139/90 | HR 87 | Temp 97.1°F | Resp 19 | Ht 76.0 in | Wt 256.4 lb

## 2013-04-28 DIAGNOSIS — C099 Malignant neoplasm of tonsil, unspecified: Secondary | ICD-10-CM

## 2013-04-28 DIAGNOSIS — E1142 Type 2 diabetes mellitus with diabetic polyneuropathy: Secondary | ICD-10-CM

## 2013-04-28 DIAGNOSIS — B977 Papillomavirus as the cause of diseases classified elsewhere: Secondary | ICD-10-CM

## 2013-04-28 DIAGNOSIS — E1149 Type 2 diabetes mellitus with other diabetic neurological complication: Secondary | ICD-10-CM

## 2013-04-28 DIAGNOSIS — L98499 Non-pressure chronic ulcer of skin of other sites with unspecified severity: Secondary | ICD-10-CM

## 2013-04-28 LAB — CBC WITH DIFFERENTIAL/PLATELET
BASO%: 0.8 % (ref 0.0–2.0)
BASOS ABS: 0.1 10*3/uL (ref 0.0–0.1)
EOS ABS: 0.5 10*3/uL (ref 0.0–0.5)
EOS%: 5.3 % (ref 0.0–7.0)
HCT: 40.6 % (ref 38.4–49.9)
HGB: 13.2 g/dL (ref 13.0–17.1)
LYMPH%: 18.9 % (ref 14.0–49.0)
MCH: 29.6 pg (ref 27.2–33.4)
MCHC: 32.5 g/dL (ref 32.0–36.0)
MCV: 91 fL (ref 79.3–98.0)
MONO#: 0.7 10*3/uL (ref 0.1–0.9)
MONO%: 7.7 % (ref 0.0–14.0)
NEUT#: 6.2 10*3/uL (ref 1.5–6.5)
NEUT%: 67.3 % (ref 39.0–75.0)
PLATELETS: 189 10*3/uL (ref 140–400)
RBC: 4.46 10*6/uL (ref 4.20–5.82)
RDW: 14.1 % (ref 11.0–14.6)
WBC: 9.2 10*3/uL (ref 4.0–10.3)
lymph#: 1.7 10*3/uL (ref 0.9–3.3)

## 2013-04-28 LAB — MAGNESIUM (CC13): Magnesium: 2.3 mg/dl (ref 1.5–2.5)

## 2013-04-28 LAB — COMPREHENSIVE METABOLIC PANEL (CC13)
ALT: 41 U/L (ref 0–55)
AST: 69 U/L — ABNORMAL HIGH (ref 5–34)
Albumin: 3.8 g/dL (ref 3.5–5.0)
Alkaline Phosphatase: 84 U/L (ref 40–150)
Anion Gap: 9 mEq/L (ref 3–11)
BUN: 11.7 mg/dL (ref 7.0–26.0)
CHLORIDE: 103 meq/L (ref 98–109)
CO2: 28 meq/L (ref 22–29)
Calcium: 9.5 mg/dL (ref 8.4–10.4)
Creatinine: 0.8 mg/dL (ref 0.7–1.3)
Glucose: 140 mg/dl (ref 70–140)
Potassium: 4.4 mEq/L (ref 3.5–5.1)
Sodium: 141 mEq/L (ref 136–145)
Total Bilirubin: 0.37 mg/dL (ref 0.20–1.20)
Total Protein: 8.4 g/dL — ABNORMAL HIGH (ref 6.4–8.3)

## 2013-04-28 MED ORDER — LIDOCAINE-PRILOCAINE 2.5-2.5 % EX CREA: 1.0000 "application " | TOPICAL_CREAM | CUTANEOUS | Status: DC | PRN

## 2013-04-28 MED ORDER — PROMETHAZINE HCL 25 MG PO TABS: 25.0000 mg | ORAL_TABLET | Freq: Four times a day (QID) | ORAL | Status: DC | PRN

## 2013-04-28 MED ORDER — ONDANSETRON HCL 8 MG PO TABS: 8.0000 mg | ORAL_TABLET | Freq: Three times a day (TID) | ORAL | Status: DC | PRN

## 2013-04-28 NOTE — Telephone Encounter (Signed)
Gave pt appt for lab, MD and chemo for for April 2015

## 2013-04-28 NOTE — Progress Notes (Signed)
To provide support, encouragement and care continuity, met with patient, his wife and dtr during scheuled appt with Dr. Alvy Bimler.  Changed dsg for G-tube per Dr. Alvy Bimler.  Gayleen Orem, RN, BSN, [facility] Head & Neck Oncology Navigator [phone]

## 2013-04-28 NOTE — Progress Notes (Signed)
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 There is also a left thyroid mass measured 44 mm   03/21/2013 Procedure The patient was seen by ENT with laryngoscopy and biopsy. Pathology is pending   03/25/2013 Imaging PET scan showed large hypermetabolic soft tissue mass in the region of the right palatine tonsil with bilateral cervical hypermetabolic lymphadenopathy indicative of metastatic disease,   04/04/2013 Surgery He underwent placement of Port-A-Cath and feeding tube   04/14/2013 Procedure The patient underwent teeth extraction.    INTERVAL HISTORY: [name] 63 y.o. male returns for further followup. He has lost some weight since I saw him. His blood sugar has been running on the low side. He noted some discharge around the feeding tube.  I have reviewed the past medical history, past surgical history, social history and family history with the patient and they are unchanged from previous note.  ALLERGIES:  is allergic to neurontin and dilaudid.  MEDICATIONS:   Current Outpatient Prescriptions  Medication Sig Dispense Refill  . acetaminophen (TYLENOL) 500 MG tablet Take 500-1,000 mg by mouth every 4 (four) hours as needed for mild pain.       [name] alprazolam (XANAX) 2 MG tablet Take 2 mg by mouth 3 (three) times daily as needed for sleep or anxiety.       . Cholecalciferol (VITAMIN D3) 5000 UNITS CAPS Take 5,000 Units by mouth daily.      . citalopram (CELEXA) 20 MG tablet Take 20 mg by mouth daily.      [name] docusate sodium (COLACE) 100 MG capsule Take 100 mg by mouth 2 (two) times daily as needed for mild constipation.      . metFORMIN (GLUCOPHAGE) 500 MG tablet Take 500 mg by mouth 2 (two) times daily with a meal.      . OxyCODONE (OXYCONTIN) 10 mg T12A 12 hr tablet Take 10 mg by mouth every 12 (twelve) hours.      [name] oxyCODONE-acetaminophen (ROXICET) 5-325 MG per tablet Take 1-2 tablets by mouth every 4 (four) hours as needed for severe pain.  30 tablet  0  . pregabalin (LYRICA) 150 MG capsule Take 300 mg by mouth 2 (two) times daily.       [name] lidocaine-prilocaine (EMLA) cream Apply 1 application topically as needed.  30 g  0  . ondansetron (ZOFRAN) 8 MG tablet Take 1 tablet (8 mg total) by mouth every 8 (eight) hours as needed for nausea.  60 tablet  3  . promethazine (PHENERGAN) 25 MG tablet Take 1 tablet (25 mg total)  by mouth every 6 (six) hours as needed for nausea.  60 tablet  3   No current facility-administered medications for this visit.    REVIEW OF SYSTEMS:   Constitutional: Denies fevers, chills  Eyes: Denies blurriness of vision Ears, nose, mouth, throat, and face: Denies mucositis or sore throat Respiratory: Denies cough, dyspnea or wheezes Cardiovascular: Denies palpitation, chest discomfort or lower extremity swelling Gastrointestinal:  Denies nausea, heartburn or change in bowel habits Skin: Denies abnormal skin rashes Lymphatics: Denies new lymphadenopathy or easy bruising Neurological:Denies numbness, tingling or new  weaknesses Behavioral/Psych: Mood is stable, no new changes  All other systems were reviewed with the patient and are negative.  PHYSICAL EXAMINATION: ECOG PERFORMANCE STATUS: 1 - Symptomatic but completely ambulatory  Filed Vitals:   04/28/13 1200  BP: 139/90  Pulse: 87  Temp: 97.1 F (36.2 C)  Resp: 19   Filed Weights   04/28/13 1200  Weight: 256 lb 6.4 oz (116.302 kg)    GENERAL:alert, no distress and comfortable. He is morbidly obese SKIN: skin color, texture, turgor are normal, no rashes or significant lesions EYES: normal, Conjunctiva are pink and non-injected, sclera clear OROPHARYNX:no exudate, no erythema and lips, buccal mucosa, and tongue normal  NECK: supple, thyroid normal size, non-tender, without nodularity LYMPH:  Bilateral cervical lymphadenopathy is noted. Nonpalpable elsewhere. LUNGS: clear to auscultation and percussion with normal breathing effort HEART: regular rate & rhythm and no murmurs and no lower extremity edema ABDOMEN:abdomen soft, non-tender and normal bowel sounds. There is discharge around the feeding tube and a pressure sore in the 7:00 position. Musculoskeletal:no cyanosis of digits and no clubbing  NEURO: alert & oriented x 3 with fluent speech, no focal motor/sensory deficits  LABORATORY DATA:  I have reviewed the data as listed    Component Value Date/Time   NA 141 04/28/2013 1132   NA 141 04/04/2013 0732   K 4.4 04/28/2013 1132   K 3.9 04/04/2013 0732   CL 103 04/04/2013 0732   CO2 28 04/28/2013 1132   CO2 25 04/04/2013 0732   GLUCOSE 140 04/28/2013 1132   GLUCOSE 136* 04/04/2013 0732   BUN 11.7 04/28/2013 1132   BUN 12 04/04/2013 0732   CREATININE 0.8 04/28/2013 1132   CREATININE 0.72 04/04/2013 0732   CALCIUM 9.5 04/28/2013 1132   CALCIUM 9.3 04/04/2013 0732   PROT 8.4* 04/28/2013 1132   PROT 7.1 04/30/2010 0406   ALBUMIN 3.8 04/28/2013 1132   ALBUMIN 2.7* 04/30/2010 0406   AST 69* 04/28/2013 1132   AST 31 04/30/2010 0406   ALT 41 04/28/2013  1132   ALT 30 04/30/2010 0406   ALKPHOS 84 04/28/2013 1132   ALKPHOS 75 04/30/2010 0406   BILITOT 0.37 04/28/2013 1132   BILITOT 0.9 04/30/2010 0406   GFRNONAA >90 04/04/2013 0732   GFRAA >90 04/04/2013 0732    No results found for this basename: SPEP, UPEP,  kappa and lambda light chains    Lab Results  Component Value Date   WBC 9.2 04/28/2013   NEUTROABS 6.2 04/28/2013   HGB 13.2 04/28/2013   HCT 40.6 04/28/2013   MCV 91.0 04/28/2013   PLT 189 04/28/2013      Chemistry      Component Value Date/Time   NA 141 04/28/2013 1132   NA 141 04/04/2013 0732   K 4.4 04/28/2013 1132   K 3.9 04/04/2013 0732   CL 103 04/04/2013 0732   CO2 28 04/28/2013 1132   CO2 25 04/04/2013 0732  BUN 11.7 04/28/2013 1132   BUN 12 04/04/2013 0732   CREATININE 0.8 04/28/2013 1132   CREATININE 0.72 04/04/2013 0732      Component Value Date/Time   CALCIUM 9.5 04/28/2013 1132   CALCIUM 9.3 04/04/2013 0732   ALKPHOS 84 04/28/2013 1132   ALKPHOS 75 04/30/2010 0406   AST 69* 04/28/2013 1132   AST 31 04/30/2010 0406   ALT 41 04/28/2013 1132   ALT 30 04/30/2010 0406   BILITOT 0.37 04/28/2013 1132   BILITOT 0.9 04/30/2010 0406     ASSESSMENT & PLAN:  #1 tonsil cancer We discussed the role of chemotherapy. The intent is for cure.  We discussed some of the risks, benefits, side-effects of Erbitux/cetuximab.  Some of the short term side-effects included, though not limited to, risk of fatigue, pancytopenia, life-threatening infections, allergic reactions, nausea, vomiting, sores in the mouth, changes in bowel habits especially diarrhea, admission to hospital for various reasons, and risks of death.   The patient is aware that the response rates discussed earlier is not guaranteed.    After a long discussion, patient made an informed decision to proceed with the prescribed plan of care and went ahead to sign the consent form today.   Patient education material was dispensed. 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  Orders Placed This Encounter  Procedures  . Ambulatory referral to Home Health    Referral Priority:  Routine    Referral Type:  Home Health Care    Referral Reason:  Specialty Services Required    Requested Specialty:  [place]    Number of Visits Requested:  1   All questions were answered. The patient knows to call the clinic with any problems, questions or concerns. No barriers to learning was detected. I spent 55 minutes counseling the patient face to face. The total time spent in the appointment was 60 minutes and more than 50% was on counseling and review of test results     Heath Lark, MD 04/28/2013 2:41 PM

## 2013-05-02 ENCOUNTER — Encounter: Payer: Self-pay | Admitting: *Deleted

## 2013-05-02 ENCOUNTER — Ambulatory Visit
Admission: RE | Admit: 2013-05-02 | Discharge: 2013-05-02 | Disposition: A | Discharge status: Home / Self Care | Payer: 59 | Source: Ambulatory Visit | Attending: Radiation Oncology | Admitting: Radiation Oncology

## 2013-05-02 ENCOUNTER — Telehealth: Payer: Self-pay | Admitting: *Deleted

## 2013-05-02 ENCOUNTER — Ambulatory Visit (HOSPITAL_BASED_OUTPATIENT_CLINIC_OR_DEPARTMENT_OTHER): Payer: 59

## 2013-05-02 ENCOUNTER — Ambulatory Visit: Payer: 59 | Admitting: Nutrition

## 2013-05-02 VITALS — BP 142/85 | HR 86 | Temp 97.9°F | Wt 257.7 lb

## 2013-05-02 VITALS — BP 136/76 | HR 85 | Temp 98.0°F | Resp 18

## 2013-05-02 DIAGNOSIS — C099 Malignant neoplasm of tonsil, unspecified: Secondary | ICD-10-CM

## 2013-05-02 DIAGNOSIS — Z5112 Encounter for antineoplastic immunotherapy: Secondary | ICD-10-CM

## 2013-05-02 MED ORDER — SODIUM CHLORIDE 0.9 % IV SOLN
Freq: Once | INTRAVENOUS | Status: AC
  Administered 2013-05-02: 09:00:00 via INTRAVENOUS

## 2013-05-02 MED ORDER — OXYCODONE-ACETAMINOPHEN 5-325 MG PO TABS
ORAL_TABLET | ORAL | Status: AC
  Filled 2013-05-02: qty 1

## 2013-05-02 MED ORDER — SODIUM CHLORIDE 0.9 % IJ SOLN
10.0000 mL | INTRAMUSCULAR | Status: DC | PRN
  Administered 2013-05-02: 10 mL
  Filled 2013-05-02: qty 10

## 2013-05-02 MED ORDER — DIPHENHYDRAMINE HCL 50 MG/ML IJ SOLN
INTRAMUSCULAR | Status: AC
  Filled 2013-05-02: qty 1

## 2013-05-02 MED ORDER — OXYCODONE-ACETAMINOPHEN 5-325 MG PO TABS
1.0000 | ORAL_TABLET | Freq: Once | ORAL | Status: AC
  Administered 2013-05-02: 1 via ORAL

## 2013-05-02 MED ORDER — DIPHENHYDRAMINE HCL 50 MG/ML IJ SOLN
50.0000 mg | Freq: Once | INTRAMUSCULAR | Status: AC
  Administered 2013-05-02: 50 mg via INTRAVENOUS

## 2013-05-02 MED ORDER — HEPARIN SOD (PORK) LOCK FLUSH 100 UNIT/ML IV SOLN
500.0000 [IU] | Freq: Once | INTRAVENOUS | Status: AC | PRN
  Administered 2013-05-02: 500 [IU]
  Filled 2013-05-02: qty 5

## 2013-05-02 MED ORDER — CETUXIMAB CHEMO IV INJECTION 200 MG/100ML
400.0000 mg/m2 | Freq: Once | INTRAVENOUS | Status: AC
  Administered 2013-05-02: 1000 mg via INTRAVENOUS
  Filled 2013-05-02: qty 500

## 2013-05-02 NOTE — Addendum Note (Signed)
Encounter addended by: Eppie Gibson, MD on: 05/02/2013 10:40 AM<BR>     Documentation filed: Notes Section

## 2013-05-02 NOTE — Progress Notes (Signed)
Dr. Alvy Bimler notified of pt c/o L eblow pain. MD notified that he has tylenol on his home med list. Dr. Alvy Bimler gave verbal order for percocet. This RN to follow up.

## 2013-05-02 NOTE — Telephone Encounter (Signed)
No new notes, chemo follow up call for tomorrow

## 2013-05-02 NOTE — Patient Instructions (Addendum)
[facility] Discharge Instructions for Patients Receiving Chemotherapy  Today you received the following chemotherapy agent: Erbitux   To help prevent nausea and vomiting after your treatment, we encourage you to take your nausea medication as prescribed.  Zofran every 8 hours as needed for nausea Phenergan every 6 hours as needed for nausea   If you develop nausea and vomiting that is not controlled by your nausea medication, call the clinic.   BELOW ARE SYMPTOMS THAT SHOULD BE REPORTED IMMEDIATELY:  *FEVER GREATER THAN 100.5 F  *CHILLS WITH OR WITHOUT FEVER  NAUSEA AND VOMITING THAT IS NOT CONTROLLED WITH YOUR NAUSEA MEDICATION  *UNUSUAL SHORTNESS OF BREATH  *UNUSUAL BRUISING OR BLEEDING  TENDERNESS IN MOUTH AND THROAT WITH OR WITHOUT PRESENCE OF ULCERS  *URINARY PROBLEMS  *BOWEL PROBLEMS  UNUSUAL RASH Items with * indicate a potential emergency and should be followed up as soon as possible.  Feel free to call the clinic you have any questions or concerns. The clinic phone number is [phone].   Cetuximab injection What is this medicine? CETUXIMAB (se TUX i mab) is a chemotherapy drug. It targets a specific protein within cancer cells and stops the cells from growing. It is used to treat colorectal cancer and head and neck cancer. This medicine may be used for other purposes; ask your health care provider or pharmacist if you have questions. COMMON BRAND NAME(S): Erbitux What should I tell my health care provider before I take this medicine? They need to know if you have any of these conditions: -heart disease -history of irregular heartbeat -history of low levels of calcium, magnesium, or potassium in the blood -lung or breathing disease, like asthma -an unusual or allergic reaction to cetuximab, other medicines, foods, dyes, or preservatives -pregnant or trying to get pregnant -breast-feeding How should I use this medicine? This drug is given  as an infusion into a vein. It is administered in a hospital or clinic by a specially trained health care professional. Talk to your pediatrician regarding the use of this medicine in children. Special care may be needed. Overdosage: If you think you have taken too much of this medicine contact a poison control center or emergency room at once. NOTE: This medicine is only for you. Do not share this medicine with others. What if I miss a dose? It is important not to miss your dose. Call your doctor or health care professional if you are unable to keep an appointment. What may interact with this medicine? Interactions are not expected. This list may not describe all possible interactions. Give your health care provider a list of all the medicines, herbs, non-prescription drugs, or dietary supplements you use. Also tell them if you smoke, drink alcohol, or use illegal drugs. Some items may interact with your medicine. What should I watch for while using this medicine? Visit your doctor or health care professional for regular checks on your progress. This drug may make you feel generally unwell. This is not uncommon, as chemotherapy can affect healthy cells as well as cancer cells. Report any side effects. Continue your course of treatment even though you feel ill unless your doctor tells you to stop. This medicine can make you more sensitive to the sun. Keep out of the sun while taking this medicine and for 2 months after the last dose. If you cannot avoid being in the sun, wear protective clothing and use sunscreen. Do not use sun lamps or tanning beds/booths. You may need blood  work done while you are taking this medicine. In some cases, you may be given additional medicines to help with side effects. Follow all directions for their use. Call your doctor or health care professional for advice if you get a fever, chills or sore throat, or other symptoms of a cold or flu. Do not treat yourself. This drug  decreases your body's ability to fight infections. Try to avoid being around people who are sick. Avoid taking products that contain aspirin, acetaminophen, ibuprofen, naproxen, or ketoprofen unless instructed by your doctor. These medicines may hide a fever. Do not become pregnant while taking this medicine. Women should inform their doctor if they wish to become pregnant or think they might be pregnant. There is a potential for serious side effects to an unborn child. Use adequate birth control methods. Avoid pregnancy for at least 6 months after your last dose. Talk to your health care professional or pharmacist for more information. Do not breast-feed an infant while taking this medicine or during the 2 months after your last dose. What side effects may I notice from receiving this medicine? Side effects that you should report to your doctor or health care professional as soon as possible: -allergic reactions like skin rash, itching or hives, swelling of the face, lips, or tongue -breathing problems -changes in vision -fast, irregular heartbeat -feeling faint or lightheaded, falls -fever, chills -mouth sores -trouble passing urine or change in the amount of urine -unusually weak or tired Side effects that usually do not require medical attention (report to your doctor or health care professional if they continue or are bothersome): -changes in skin like acne, cracks, skin dryness -constipation -diarrhea -headache -nail changes -nausea, vomiting -stomach upset -weight loss This list may not describe all possible side effects. Call your doctor for medical advice about side effects. You may report side effects to FDA at 1-800-FDA-1088. Where should I keep my medicine? This drug is given in a hospital or clinic and will not be stored at home. NOTE: This sheet is a summary. It may not cover all possible information. If you have questions about this medicine, talk to your doctor, pharmacist,  or health care provider.  2014, [employer]vier/Gold Standard. (2009-11-20 14:01:41)

## 2013-05-02 NOTE — Progress Notes (Signed)
Nutrition followup with patient in chemotherapy area.  Patient has been diagnosed with tonsil cancer and is receiving weekly Erbitux and daily radiation therapy.  Patient is anxious today secondary to his first treatment begins today.  He reports good appetite and is able to eat whatever he wants.  He is very nervous.  He reports radiation therapy was difficult.  Patient is flushing feeding tube with water several times a day.  He has no concerns or questions regarding his feeding tube.  Nutrition diagnosis: Predicted suboptimal energy intake continues.  Intervention: Patient was educated to consume smaller, more frequent meals and snacks throughout the day concentrating on protein-rich foods.  Patient was educated to drink Ensure Plus 3-4 times a day for weight maintenance.  Provided patient with support and encouragement.  Teach back method used.  Monitoring, evaluation, goals: Patient will tolerate adequate calories and protein for weight maintenance.  If patient loses 5% of his usual body weight, will begin tube feedings.  Next visit: Monday, April 27, during chemotherapy.

## 2013-05-02 NOTE — Progress Notes (Signed)
Pt here for first RT.  No c/c.  No pain.

## 2013-05-02 NOTE — Progress Notes (Addendum)
Weekly Management Note:  outpatient Current Dose:  2 Gy  Projected Dose: 70 Gy   Narrative:  The patient presents for routine under treatment assessment.  CBCT/MVCT images/Port film x-rays were reviewed.  The chart was checked. No new complaints. Eating well. Starting Cetuximab today.  Physical Findings:  weight is 257 lb 11.2 oz (116.892 kg). His oral temperature is 97.9 F (36.6 C). His blood pressure is 142/85 and his pulse is 86. His oxygen saturation is 96%.  NAD. large R tonsillar mass and bulky b/l neck adenopathy.  CBC    Component Value Date/Time   WBC 9.2 04/28/2013 1132   WBC 8.8 04/04/2013 0732   RBC 4.46 04/28/2013 1132   RBC 4.89 04/04/2013 0732   HGB 13.2 04/28/2013 1132   HGB 15.0 04/04/2013 0732   HCT 40.6 04/28/2013 1132   HCT 43.9 04/04/2013 0732   PLT 189 04/28/2013 1132   PLT 171 04/04/2013 0732   MCV 91.0 04/28/2013 1132   MCV 89.8 04/04/2013 0732   MCH 29.6 04/28/2013 1132   MCH 30.7 04/04/2013 0732   MCHC 32.5 04/28/2013 1132   MCHC 34.2 04/04/2013 0732   RDW 14.1 04/28/2013 1132   RDW 14.3 04/04/2013 0732   LYMPHSABS 1.7 04/28/2013 1132   LYMPHSABS 1.4 10/17/2012 2222   MONOABS 0.7 04/28/2013 1132   MONOABS 0.5 10/17/2012 2222   EOSABS 0.5 04/28/2013 1132   EOSABS 0.3 10/17/2012 2222   BASOSABS 0.1 04/28/2013 1132   BASOSABS 0.0 10/17/2012 2222     CMP     Component Value Date/Time   NA 141 04/28/2013 1132   NA 141 04/04/2013 0732   K 4.4 04/28/2013 1132   K 3.9 04/04/2013 0732   CL 103 04/04/2013 0732   CO2 28 04/28/2013 1132   CO2 25 04/04/2013 0732   GLUCOSE 140 04/28/2013 1132   GLUCOSE 136* 04/04/2013 0732   BUN 11.7 04/28/2013 1132   BUN 12 04/04/2013 0732   CREATININE 0.8 04/28/2013 1132   CREATININE 0.72 04/04/2013 0732   CALCIUM 9.5 04/28/2013 1132   CALCIUM 9.3 04/04/2013 0732   PROT 8.4* 04/28/2013 1132   PROT 7.1 04/30/2010 0406   ALBUMIN 3.8 04/28/2013 1132   ALBUMIN 2.7* 04/30/2010 0406   AST 69* 04/28/2013 1132   AST 31 04/30/2010 0406   ALT 41 04/28/2013 1132    ALT 30 04/30/2010 0406   ALKPHOS 84 04/28/2013 1132   ALKPHOS 75 04/30/2010 0406   BILITOT 0.37 04/28/2013 1132   BILITOT 0.9 04/30/2010 0406   GFRNONAA >90 04/04/2013 0732   GFRAA >90 04/04/2013 0732     Impression:  The patient is tolerating radiotherapy.   Plan:  Continue radiotherapy as planned.   -----------------------------------  -----------------------------------  Eppie Gibson, MD

## 2013-05-02 NOTE — Progress Notes (Addendum)
  IMRT Device Note   11.6  delivered field widths represent one set of IMRT treatment devices. The code is [phone].  -----------------------------------    Eppie Gibson, MD

## 2013-05-03 ENCOUNTER — Ambulatory Visit
Admission: RE | Admit: 2013-05-03 | Discharge: 2013-05-03 | Disposition: A | Discharge status: Home / Self Care | Payer: 59 | Source: Ambulatory Visit | Attending: Radiation Oncology | Admitting: Radiation Oncology

## 2013-05-03 ENCOUNTER — Other Ambulatory Visit: Payer: Self-pay | Admitting: Radiation Oncology

## 2013-05-03 ENCOUNTER — Telehealth: Payer: Self-pay | Admitting: *Deleted

## 2013-05-03 NOTE — Telephone Encounter (Signed)
Spoke with wife for post chemo follow up call.  Wife [name] stated pt was doing very well yesterday after chemo.  Per wife, pt has very good appetite, and drinking lots of fluids as tolerated.  Bowel and bladder function fine.  Denied pain.  Denied nausea/vomiting.   Wife [name] - who is the nurse but not working at present due to a stroke - knows how to manage symptoms for pt.  [name] knows to contact office if pt has any new issues.  [name] also aware of pt's next appt on 05/09/13.

## 2013-05-03 NOTE — Progress Notes (Signed)
To provide support and encouragement, met with patient during his first RT. He tolerated the procedure without difficulty.  Continuing navigation as L1 patient (new patient).  Gayleen Orem, RN, BSN, [facility] Neck Oncology Navigator [phone]

## 2013-05-04 ENCOUNTER — Ambulatory Visit
Admission: RE | Admit: 2013-05-04 | Discharge: 2013-05-04 | Disposition: A | Discharge status: Home / Self Care | Payer: 59 | Source: Ambulatory Visit | Attending: Radiation Oncology | Admitting: Radiation Oncology

## 2013-05-05 ENCOUNTER — Ambulatory Visit
Admission: RE | Admit: 2013-05-05 | Discharge: 2013-05-05 | Disposition: A | Discharge status: Home / Self Care | Payer: 59 | Source: Ambulatory Visit | Attending: Radiation Oncology | Admitting: Radiation Oncology

## 2013-05-05 ENCOUNTER — Ambulatory Visit: Payer: 59

## 2013-05-06 ENCOUNTER — Ambulatory Visit (HOSPITAL_COMMUNITY)
Admission: RE | Admit: 2013-05-06 | Discharge: 2013-05-06 | Disposition: A | Discharge status: Home / Self Care | Payer: 59 | Source: Ambulatory Visit | Attending: Surgery | Admitting: Surgery

## 2013-05-06 ENCOUNTER — Ambulatory Visit
Admission: RE | Admit: 2013-05-06 | Discharge: 2013-05-06 | Disposition: A | Discharge status: Home / Self Care | Payer: 59 | Source: Ambulatory Visit | Attending: Radiation Oncology | Admitting: Radiation Oncology

## 2013-05-06 ENCOUNTER — Encounter (INDEPENDENT_AMBULATORY_CARE_PROVIDER_SITE_OTHER): Payer: Self-pay | Admitting: Surgery

## 2013-05-06 ENCOUNTER — Ambulatory Visit (INDEPENDENT_AMBULATORY_CARE_PROVIDER_SITE_OTHER): Payer: 59 | Admitting: Surgery

## 2013-05-06 VITALS — BP 124/80 | HR 80 | Temp 97.2°F | Resp 16 | Ht 74.0 in | Wt 256.0 lb

## 2013-05-06 DIAGNOSIS — K9423 Gastrostomy malfunction: Secondary | ICD-10-CM | POA: Insufficient documentation

## 2013-05-06 DIAGNOSIS — C099 Malignant neoplasm of tonsil, unspecified: Secondary | ICD-10-CM

## 2013-05-06 DIAGNOSIS — Z431 Encounter for attention to gastrostomy: Secondary | ICD-10-CM | POA: Insufficient documentation

## 2013-05-06 MED ORDER — BIAFINE EX EMUL
CUTANEOUS | Status: DC | PRN
  Administered 2013-05-06: 12:00:00 via TOPICAL

## 2013-05-06 MED ORDER — IOHEXOL 300 MG/ML  SOLN
50.0000 mL | Freq: Once | INTRAMUSCULAR | Status: AC | PRN
  Administered 2013-05-06: 1 mL

## 2013-05-06 NOTE — Progress Notes (Signed)
Subjective:     Patient ID: [name], male   DOB: 14-May-1950, 63 y.o.   MRN: [id number]  HPI  Note: This dictation was prepared with Dragon/digital dictation along with [facility] technology. Any transcriptional errors that result from this process are unintentional.       [name]  03/15/1950 [id number]  Patient Care Team: Halford Chessman, MD as PCP - General (Family Medicine) Brooks Sailors, RN as Registered Nurse (Oncology) Heath Lark, MD as Consulting Physician (Hematology and Oncology) Eppie Gibson, MD as Attending Physician (Radiation Oncology)  Procedure (Date: 04/04/2013):  PRE-OPERATIVE DIAGNOSIS: CANCER  POST-OPERATIVE DIAGNOSIS: CANCER   PROCEDURE: Procedure(s):  INSERTION PORT-A-CATH (N/A)  LAPAROSCOPIC GASTROSTOMY TUBE PLACEMENT (N/A)   SURGEON: Surgeon(s) and Role:  * Ralene Ok, MD - Primary  * Ascencion Dike, MD - Assisting   This patient returns for surgical re-evaluation.  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  Had gastrostomy tube placed laparoscopically 4 weeks ago.  Apparently there has been issues of leaking around the tube.  It fell out in clinic.  It was replaced.  There has to be urgently seen.  We fit her in urgentlyThe fact that an urgently.  Patient has been eating okay.  Otherwise has been doing flushes of the gastrostomy tube 3 times a day.  She is a former Marine scientist and has been working to help take care of him.    The daughter comes with him today as well.  Patient is not having fevers or abdominal pain.  Patient Active Problem List   Diagnosis Date Noted  . Gastrostomy tube dyslodgement - replaced 05/06/2013 05/06/2013  . Gastrostomy in place 04/04/2013  . 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 Diabetic neuropathy, painful 03/22/2013  . Chest pain 10/18/2012  . Diabetes mellitus, type 2 10/18/2012  . Other and unspecified hyperlipidemia 10/18/2012  . Obesity, unspecified 10/18/2012  . Thrombocytopenia, unspecified  10/18/2012    Past Medical History  Diagnosis Date  . Chest pain     10/14  . Urethral stricture     s/p dilitation  . Neuropathy     compression neuropathy right hip;s/p replacement  . Allergy   . Fibromyalgia   . Anxiety   . Depression   . Constipation   . Arthritis     knees, HIps, Hands  . Tonsillar cancer   . Type II diabetes mellitus   . Pneumonia 2012  . Complication of anesthesia     bleeding during intubation 04/04/13 due to friability of right tonsillar cancer    Past Surgical History  Procedure Laterality Date  . Total hip arthroplasty Right 2000    Dr. Percell Miller  . Cystoscopy      Dr. Karsten Ro  . Portacath placement Right 04/04/2013  . Gastrostomy tube placement  04/04/2013  . Portacath placement N/A 04/04/2013    Procedure: INSERTION PORT-A-CATH;  Surgeon: Ralene Ok, MD;  Location: [place];  Service: General;  Laterality: N/A;  . Laparoscopic gastrostomy N/A 04/04/2013    Procedure: LAPAROSCOPIC GASTROSTOMY TUBE PLACEMENT ;  Surgeon: Ralene Ok, MD;  Location: [place];  Service: General;  Laterality: N/A;  . Nasal hemorrhage control N/A 04/04/2013    Procedure: Control of oropharyngeal hemorrhage;  Surgeon: Ascencion Dike, MD;  Location: [facility] OR;  Service: ENT;  Laterality: N/A;  . Multiple extractions with alveoloplasty N/A 04/14/2013    Procedure: Extraction of tooth #'s 1,2,3,4,5,6,7,8,9,10,11,12,13,14,15,17,18,19,20,21,22,23,24,25,26,27,28,29, 30, 31, and 32 with alveoloplasty and bilateral mandibular tori reductions.;  Surgeon:  Lenn Cal, DDS;  Location: [place];  Service: Oral Surgery;  Laterality: N/A;    History   Social History  . Marital Status: Married    Spouse Name: N/A    Number of Children: N/A  . Years of Education: N/A   Occupational History  . Not on file.   Social History Main Topics  . Smoking status: Never Smoker   . Smokeless tobacco: Never Used  . Alcohol Use: No     Comment: Occasional beer  . Drug Use: No  . Sexual Activity:  Yes   Other Topics Concern  . Not on file   Social History Narrative  . No narrative on file    Family History  Problem Relation Age of Onset  . Hyperlipidemia Brother   . Cancer Mother     Deceased with leukemia, had uterine ca  . Cancer Cousin     living brain cancer, male    Current Outpatient Prescriptions  Medication Sig Dispense Refill  . acetaminophen (TYLENOL) 500 MG tablet Take 500-1,000 mg by mouth every 4 (four) hours as needed for mild pain.       [name] alprazolam (XANAX) 2 MG tablet Take 2 mg by mouth 3 (three) times daily as needed for sleep or anxiety.       . Cholecalciferol (VITAMIN D3) 5000 UNITS CAPS Take 5,000 Units by mouth daily.      . citalopram (CELEXA) 20 MG tablet Take 20 mg by mouth daily.      [name] docusate sodium (COLACE) 100 MG capsule Take 100 mg by mouth 2 (two) times daily as needed for mild constipation.      [name] emollient (BIAFINE) cream Apply topically 2 (two) times daily.      [name] lidocaine-prilocaine (EMLA) cream Apply 1 application topically as needed.  30 g  0  . metFORMIN (GLUCOPHAGE) 500 MG tablet Take 500 mg by mouth 2 (two) times daily with a meal.      . ondansetron (ZOFRAN) 8 MG tablet Take 1 tablet (8 mg total) by mouth every 8 (eight) hours as needed for nausea.  60 tablet  3  . OxyCODONE (OXYCONTIN) 10 mg T12A 12 hr tablet Take 10 mg by mouth every 12 (twelve) hours.      [name] oxyCODONE-acetaminophen (ROXICET) 5-325 MG per tablet Take 1-2 tablets by mouth every 4 (four) hours as needed for severe pain.  30 tablet  0  . pregabalin (LYRICA) 150 MG capsule Take 300 mg by mouth 2 (two) times daily.       . promethazine (PHENERGAN) 25 MG tablet Take 1 tablet (25 mg total) by mouth every 6 (six) hours as needed for nausea.  60 tablet  3   No current facility-administered medications for this visit.   Facility-Administered Medications Ordered in Other Visits  Medication Dose Route Frequency Provider Last Rate Last Dose  . topical emolient (BIAFINE)  emulsion   Topical PRN Eppie Gibson, MD         Allergies  Allergen Reactions  . Neurontin [Gabapentin] Other (See Comments)    Causes seizures  . Dilaudid [Hydromorphone Hcl] Other (See Comments)    Oxygen saturation drops    BP 124/80  Pulse 80  Temp(Src) 97.2 F (36.2 C) (Temporal)  Resp 16  Ht 6\' 2"  (1.88 m)  Wt 256 lb (116.121 kg)  BMI 32.85 kg/m2  No results found.  Review of Systems  Constitutional: Negative for fever, chills, diaphoresis and fatigue.  HENT: Positive  for sore throat and trouble swallowing.   Eyes: Negative for photophobia and visual disturbance.  Respiratory: Negative for choking and shortness of breath.   Cardiovascular: Negative for chest pain and palpitations.  Gastrointestinal: Negative for nausea, vomiting, abdominal distention, anal bleeding and rectal pain.  Genitourinary: Negative for dysuria, urgency, difficulty urinating and testicular pain.  Musculoskeletal: Negative for arthralgias, gait problem, myalgias and neck pain.  Skin: Positive for wound. Negative for color change and rash.  Neurological: Negative for dizziness, speech difficulty, weakness and numbness.  Hematological: Negative for adenopathy.  Psychiatric/Behavioral: Negative for hallucinations, confusion and agitation.       Objective:   Physical Exam  Constitutional: He is oriented to person, place, and time. He appears well-developed and well-nourished. No distress.  HENT:  Head: Normocephalic.  Mouth/Throat: Oropharynx is clear and moist. No oropharyngeal exudate.  Very difficult for him to speak.  More garbled speech.  Eyes: Conjunctivae and EOM are normal. Pupils are equal, round, and reactive to light. No scleral icterus.  Neck: Normal range of motion. No tracheal deviation present.  Cardiovascular: Normal rate, normal heart sounds and intact distal pulses.   Pulmonary/Chest: Effort normal. No accessory muscle usage. No respiratory distress.  No stridor.  No  conversational dyspnea.  Abdominal: Soft. He exhibits no distension. There is no tenderness. Hernia confirmed negative in the right inguinal area and confirmed negative in the left inguinal area.    Incisions clean with normal healing ridges.  No hernias  Musculoskeletal: Normal range of motion. He exhibits no tenderness.  Neurological: He is alert and oriented to person, place, and time. No cranial nerve deficit. He exhibits normal muscle tone. Coordination normal.  Skin: Skin is warm and dry. No rash noted. He is not diaphoretic.  Psychiatric: He has a normal mood and affect. His behavior is normal.       Assessment:     Leaking around gastrostomy tube was pumped balloon.  Dislodgment x1     Plan:     I went ahead and replaced the current gastrostomy tube with a 24 French MIC Gastrostomy feeding tube 0100-22.  I was able to aspirate gastric contents.  Filled 66mL water into balloon.  Pullback flange.  Placed 2-0 silk suture the flange x4.  Gauze & binder replaced.   Patient's wife pleasant but anxious.  Talking with many questions.  Tearful but consolable.  Myself, PA, medical assisted and room.  Answering questions.  He came back with them and discussed recommendations for flushing and wound care.  I gave handouts on management of gastrostomy tube care.  I noted that there were still having issues, to please call us for evaluation.  They expressed understanding and appreciation.  Because it is been less than 6 weeks since tube was placed, recommended injection of contrast into gastrostomy tube to make sure that it is an intragastric placement.  The family is on their way to sleepy male.  Radiology is expecting them before the office closes.

## 2013-05-06 NOTE — Progress Notes (Signed)
Mr Bound arrived today for education regarding radiation therapy to his oropharynx with management strategies inclusive of pain/difficulty swallowing, changes in skin in treatment field such as redness, dryness and moist or dry peel of lower face and neck, dry mouth/tenderness of oral mucosa,   thickened saliva, fatigue. Reviewed excercises that he was instructed to perform daily as ordered by Garald Balding, Speech Pathologist, to preserve mobility of his jaw.  Given the Radiation Therapy and You booklet with appropriate pages marked.  Given Biafine with instructions to apply to lower face and neck twice daily, after treatment and at bedtime which will alleviate dryness.  Teach back method.  Patient requested that his PEG tube be assessed since he is unable to flush it.  When dressing removed the Peg tube actually began to slide out of the insertion site with flow of gastric contents.  Noted that the ballon at the site was not inflated.  The tube site was cleansed with sterile saline and secured with drain sponges and tape.  Called Dr. Rosendo Gros office and spoke with a nurse.  Mr Engh has an appointment to be seen today at 2:45pm. Mr Gangl informed of time and encouraged to keep appointment

## 2013-05-06 NOTE — Patient Instructions (Signed)
Please consider the recommendations that we have given you today:  Obtained G-tube x-ray study to make sure tube is in side stomach.  Okay to flush stewed as you are doing.  Twice a day is probably enough.  Eventually came to down once a day.  Keep the skin clean and dry.  Is okay to use clean gauze.  It is okay to wear a binder to help protect the tube from being pulled out.  Drainage around the tube should calm down and improve over the next few weeks.  If things are not getting better or worsening, call our office to help troubleshoot things appear  See the Handout(s) we have given you.  Please call our office at [phone] if you wish to schedule surgery or if you have further questions / concerns.    Gastrostomy Tube Home Guide A gastrostomy tube is a tube that is surgically placed into the stomach. It is also called a "G-tube." G-tubes are used when a person is unable to eat and drink enough on their own to stay healthy. The tube is inserted into the stomach through a small cut (incision) in the skin. This tube is used for:  Feeding.  Giving medication. GASTROSTOMY TUBE CARE  Wash your hands with soap and water.  Remove the old dressing (if any). Some styles of G-tubes may need a dressing inserted between the skin and the G-tube. Other types of G-tubes do not require a dressing. Ask your health care provider if a dressing is needed.  Check the area where the tube enters the skin (insertion site) for redness, swelling, or pus-like (purulent) drainage. A small amount of clear or tan liquid drainage is normal. Check to make sure scar tissue (skin) is not growing around the insertion site. This could have a raised, bumpy appearance.  A cotton swab can be used to clean the skin around the tube:  When the G-tube is first put in, a normal saline solution or water can be used to clean the skin.  Mild soap and warm water can be used when the skin around the G-tube site has  healed.  Roll the cotton swab around the G-tube insertion site to remove any drainage or crusting at the insertion site. STOMACH RESIDUALS Feeding tube residuals are the amount of liquids that are in the stomach at any given time. Residuals may be checked before giving feedings, medications, or as instructed by your health care provider.  Ask your health care provider if there are instances when you would not start tube feedings depending on the amount or type of contents withdrawn from the stomach.  Check residuals by attaching a syringe to the G-tube and pull back on the syringe plunger. Note the amount and return the residual back into the stomach. FLUSHING THE G-TUBE  The G-tube should be periodically flushed with clean warm water to keep it from clogging.  Flush the G tube 2 times a day  Flush the G-tube after feedings or medications. Draw up 30 mL of warm water in a syringe. Connect the syringe to the G-tube and slowly push the water into the tube.  Do not push feedings, medications, or flushes rapidly. Flush the G-tube gently and slowly.  Only use syringes made for G-tubes to flush medications or feedings.  Your health care provider may want the G-tube flushed more often or with more water. If this is the case, follow your health care provider's instructions. FEEDINGS Your health care provider will determine  whether feedings are given as a bolus (a certain amount given at one time and at scheduled times) or whether feedings will be given continuously on a feeding pump.   Formulas should be given at room temperature.  If feedings are continuous, no more than 4 hours worth of feedings should be placed in the feeding bag. This helps prevent spoilage or accidental excess infusion.  Cover and place unused formula in the refrigerator.  If feedings are continuous, stop the feedings when medications or flushes are given. Be sure to restart the feedings.  Feeding bags and syringes  should be replaced as instructed by your health care provider. GIVING MEDICATION   In general, it is best if all medications are in a liquid form for G-tube administration. Liquid medications are less likely to clog the G-tube.  Mix the liquid medication with 30 mL (or amount recommended by your health care provider) of warm water.  Draw up the medication into the syringe.  Attach the syringe to the G-tube and slowly push the mixture into the G-tube.  After giving the medication, draw up 30 mL of warm water in the syringe and slowly flush the G-tube.  For pills or capsules, check with your health care provider first before crushing medications. Some pills are not effective if they are crushed. Some capsules are sustained release medications.  If appropriate, crush the pill or capsule and mix with 30 mL of warm water. Using the syringe, slowly push the medication through the tube, then flush the tube with another 30 mL of tap water. G-TUBE PROBLEMS G-tube was pulled out.  Cause: May have been pulled out accidentally.  Solutions: Cover the opening with clean dressing and tape. Call your health care provider right away. The G-tube should be put in as soon as possible (within 4 hours) so the G-tube opening (tract) does not close. The G-tube needs to be put in at a healthcare setting. An X-ray needs to be done to confirm placement before the G-tube can be used again. Redness, irritation, soreness, or foul odor around the gastrostomy site.  Cause: May be caused by leakage or infection.  Solutions: Call your health care provider right away. Large amount of leakage of fluid or mucus-like liquid present (a large amount means it soaks clothing).  Cause: Many reasons could cause the G-tube to leak.  Solutions: Call your health care provider to discuss the amount of leakage. Skin or scar tissue appears to be growing where tube enters skin.   Cause: Tissue growth may develop around the insertion  site if the G-tube is moved or pulled on excessively.  Solutions: Secure tube with tape so that excess movement does not occur. Call your health care provider. G-tube is clogged.  Cause: Thick formula or medication.  Solutions: Try to slowly push warm water into the tube with a large syringe. Never try to push any object into the tube to unclog it. Do not force fluid into the G-tube. If you are unable to unclog the tube, call your health care provider right away. TIPS  Head of Bed [facility]) position refers to the upright position of a person's upper body.  When giving medications or a feeding bolus, keep the [facility] up as told by your health care provider. Do this during the feeding and for 1 hour after the feeding or medication administration.  If continuous feedings are being given, it is best to keep the [facility] up as told by your health care provider. When ADLs (  Activities of Daily Living) are performed and the [facility] needs to be flat, be sure to turn the feeding pump off. Restart the feeding pump when the [facility] is returned to the recommended height.  Do not pull or put tension on the tube.  To prevent fluid backflow, kink the G-tube before removing the cap or disconnecting a syringe.  Check the G-tube length every day. Measure from the insertion site to the end of the G-tube. If the length is longer than previous measurements, the tube may be coming out. Call your health care provider if you notice increasing G-tube length.  Oral care, such as brushing teeth, must be continued.  You may need to remove excess air (vent) from the G-tube. Your health care provider will tell you if this is needed.  Always call your health care provider if you have questions or problems with the G-tube. SEEK IMMEDIATE MEDICAL CARE IF:   You have severe abdominal pain, tenderness, or abdominal bloating (distension).  You have nausea or vomiting.  You are constipated or have problems moving your bowels.  The G-tube  insertion site is red, swollen, has a foul smell, or has yellow or brown drainage.  You have difficulty breathing or shortness of breath.  You have a fever.  You have a large amount of feeding tube residuals.  The G-tube is clogged and cannot be flushed. MAKE SURE YOU:   Understand these instructions.  Will watch your condition.  Will get help right away if you are not doing well or get worse. Document Released: 03/10/2001 Document Revised: 10/20/2012 Document Reviewed: 09/06/2012 [facility] Patient Information 2014 [place].

## 2013-05-09 ENCOUNTER — Telehealth (INDEPENDENT_AMBULATORY_CARE_PROVIDER_SITE_OTHER): Payer: Self-pay

## 2013-05-09 ENCOUNTER — Encounter: Payer: Self-pay | Admitting: Hematology and Oncology

## 2013-05-09 ENCOUNTER — Encounter: Payer: Self-pay | Admitting: *Deleted

## 2013-05-09 ENCOUNTER — Encounter: Payer: Self-pay | Admitting: Radiation Oncology

## 2013-05-09 ENCOUNTER — Ambulatory Visit
Admission: RE | Admit: 2013-05-09 | Discharge: 2013-05-09 | Disposition: A | Discharge status: Home / Self Care | Payer: 59 | Source: Ambulatory Visit | Attending: Radiation Oncology | Admitting: Radiation Oncology

## 2013-05-09 ENCOUNTER — Telehealth: Payer: Self-pay | Admitting: Hematology and Oncology

## 2013-05-09 ENCOUNTER — Ambulatory Visit (HOSPITAL_BASED_OUTPATIENT_CLINIC_OR_DEPARTMENT_OTHER): Payer: 59

## 2013-05-09 ENCOUNTER — Other Ambulatory Visit (HOSPITAL_BASED_OUTPATIENT_CLINIC_OR_DEPARTMENT_OTHER): Payer: 59

## 2013-05-09 ENCOUNTER — Ambulatory Visit: Payer: Self-pay

## 2013-05-09 ENCOUNTER — Ambulatory Visit (HOSPITAL_BASED_OUTPATIENT_CLINIC_OR_DEPARTMENT_OTHER): Payer: 59 | Admitting: Hematology and Oncology

## 2013-05-09 ENCOUNTER — Ambulatory Visit: Payer: 59 | Admitting: Nutrition

## 2013-05-09 ENCOUNTER — Ambulatory Visit: Payer: 59 | Attending: Radiation Oncology

## 2013-05-09 VITALS — BP 132/72 | HR 100 | Wt 254.2 lb

## 2013-05-09 VITALS — BP 124/75 | HR 98 | Temp 97.7°F | Resp 18 | Ht 74.0 in | Wt 253.5 lb

## 2013-05-09 DIAGNOSIS — C099 Malignant neoplasm of tonsil, unspecified: Secondary | ICD-10-CM

## 2013-05-09 DIAGNOSIS — K1230 Oral mucositis (ulcerative), unspecified: Secondary | ICD-10-CM

## 2013-05-09 DIAGNOSIS — L8990 Pressure ulcer of unspecified site, unspecified stage: Secondary | ICD-10-CM

## 2013-05-09 DIAGNOSIS — E1149 Type 2 diabetes mellitus with other diabetic neurological complication: Secondary | ICD-10-CM

## 2013-05-09 DIAGNOSIS — Z5112 Encounter for antineoplastic immunotherapy: Secondary | ICD-10-CM

## 2013-05-09 DIAGNOSIS — E1142 Type 2 diabetes mellitus with diabetic polyneuropathy: Secondary | ICD-10-CM

## 2013-05-09 DIAGNOSIS — L89899 Pressure ulcer of other site, unspecified stage: Secondary | ICD-10-CM

## 2013-05-09 DIAGNOSIS — R21 Rash and other nonspecific skin eruption: Secondary | ICD-10-CM

## 2013-05-09 DIAGNOSIS — G589 Mononeuropathy, unspecified: Secondary | ICD-10-CM | POA: Insufficient documentation

## 2013-05-09 DIAGNOSIS — K121 Other forms of stomatitis: Secondary | ICD-10-CM

## 2013-05-09 DIAGNOSIS — B977 Papillomavirus as the cause of diseases classified elsewhere: Secondary | ICD-10-CM

## 2013-05-09 LAB — BASIC METABOLIC PANEL (CC13)
ANION GAP: 11 meq/L (ref 3–11)
BUN: 15.8 mg/dL (ref 7.0–26.0)
CO2: 24 mEq/L (ref 22–29)
Calcium: 9.9 mg/dL (ref 8.4–10.4)
Chloride: 104 mEq/L (ref 98–109)
Creatinine: 0.8 mg/dL (ref 0.7–1.3)
Glucose: 256 mg/dl — ABNORMAL HIGH (ref 70–140)
POTASSIUM: 4.4 meq/L (ref 3.5–5.1)
Sodium: 138 mEq/L (ref 136–145)

## 2013-05-09 LAB — MAGNESIUM (CC13): Magnesium: 2.4 mg/dl (ref 1.5–2.5)

## 2013-05-09 MED ORDER — OXYCODONE-ACETAMINOPHEN 5-325 MG PO TABS
2.0000 | ORAL_TABLET | Freq: Once | ORAL | Status: AC
  Administered 2013-05-09: 2 via ORAL

## 2013-05-09 MED ORDER — MAGIC MOUTHWASH W/LIDOCAINE: ORAL | Status: DC

## 2013-05-09 MED ORDER — DIPHENHYDRAMINE HCL 50 MG/ML IJ SOLN
INTRAMUSCULAR | Status: AC
  Filled 2013-05-09: qty 1

## 2013-05-09 MED ORDER — CETUXIMAB CHEMO IV INJECTION 200 MG/100ML
250.0000 mg/m2 | Freq: Once | INTRAVENOUS | Status: AC
  Administered 2013-05-09: 600 mg via INTRAVENOUS
  Filled 2013-05-09: qty 300

## 2013-05-09 MED ORDER — DIPHENHYDRAMINE HCL 50 MG/ML IJ SOLN
50.0000 mg | Freq: Once | INTRAMUSCULAR | Status: AC
  Administered 2013-05-09: 50 mg via INTRAVENOUS

## 2013-05-09 MED ORDER — SUCRALFATE 1 G PO TABS: ORAL_TABLET | ORAL | Status: DC

## 2013-05-09 MED ORDER — MORPHINE SULFATE (CONCENTRATE) 20 MG/ML PO SOLN: 10.0000 mg | ORAL | Status: DC | PRN

## 2013-05-09 MED ORDER — OXYCODONE-ACETAMINOPHEN 5-325 MG PO TABS
ORAL_TABLET | ORAL | Status: AC
  Filled 2013-05-09: qty 2

## 2013-05-09 MED ORDER — SODIUM CHLORIDE 0.9 % IV SOLN
Freq: Once | INTRAVENOUS | Status: AC
  Administered 2013-05-09: 11:00:00 via INTRAVENOUS

## 2013-05-09 MED ORDER — SODIUM CHLORIDE 0.9 % IJ SOLN
10.0000 mL | INTRAMUSCULAR | Status: DC | PRN
  Administered 2013-05-09: 10 mL
  Filled 2013-05-09: qty 10

## 2013-05-09 MED ORDER — HEPARIN SOD (PORK) LOCK FLUSH 100 UNIT/ML IV SOLN
500.0000 [IU] | Freq: Once | INTRAVENOUS | Status: AC | PRN
  Administered 2013-05-09: 500 [IU]
  Filled 2013-05-09: qty 5

## 2013-05-09 NOTE — Patient Instructions (Signed)
[facility] Discharge Instructions for Patients Receiving Chemotherapy  Today you received the following chemotherapy agents :  Erbitux.  To help prevent nausea and vomiting after your treatment, we encourage you to take your nausea medication as instructed by your physician.   If you develop nausea and vomiting that is not controlled by your nausea medication, call the clinic.   BELOW ARE SYMPTOMS THAT SHOULD BE REPORTED IMMEDIATELY:  *FEVER GREATER THAN 100.5 F  *CHILLS WITH OR WITHOUT FEVER  NAUSEA AND VOMITING THAT IS NOT CONTROLLED WITH YOUR NAUSEA MEDICATION  *UNUSUAL SHORTNESS OF BREATH  *UNUSUAL BRUISING OR BLEEDING  TENDERNESS IN MOUTH AND THROAT WITH OR WITHOUT PRESENCE OF ULCERS  *URINARY PROBLEMS  *BOWEL PROBLEMS  UNUSUAL RASH Items with * indicate a potential emergency and should be followed up as soon as possible.  Feel free to call the clinic you have any questions or concerns. The clinic phone number is [phone].

## 2013-05-09 NOTE — Progress Notes (Signed)
Patient states, "I am starting to get used to the mask." No anterior neck skin changes noted. Reports using biafine cream as directed. Denies difficulty swallowing. Reports left throat is sore at jaw line. Denies nausea, vomiting, headache or dizziness. Reports sleeping well. Weight stable. Patient reports that he has an excellent appetite. Reports drinking 4 cans of Ensure per day.

## 2013-05-09 NOTE — Progress Notes (Signed)
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 There is also a left thyroid mass measured 44 mm   03/21/2013 Procedure The patient was seen by ENT with laryngoscopy and biopsy. Pathology is pending   03/25/2013 Imaging PET scan showed large hypermetabolic soft tissue mass in the region of the right palatine tonsil with bilateral cervical hypermetabolic lymphadenopathy indicative of metastatic disease,   04/04/2013 Surgery He underwent placement of Port-A-Cath and feeding tube   04/14/2013 Procedure The patient underwent teeth extraction.    INTERVAL HISTORY: [name] 63 y.o. male returns for further followup. His feeding tube fell out and and that has to be replaced last week. He denies hyperglycemia. He complained of mouth sores with difficulty swallowing his pills. Denies any nausea or vomiting.  I have reviewed the past medical history, past surgical history, social history and family history with the patient and they are unchanged from previous  note.  ALLERGIES:  is allergic to neurontin and dilaudid.  MEDICATIONS:  Current Outpatient Prescriptions  Medication Sig Dispense Refill  . acetaminophen (TYLENOL) 500 MG tablet Take 500-1,000 mg by mouth every 4 (four) hours as needed for mild pain.       [name] alprazolam (XANAX) 2 MG tablet Take 2 mg by mouth 3 (three) times daily as needed for sleep or anxiety.       . Alum & Mag Hydroxide-Simeth (MAGIC MOUTHWASH W/LIDOCAINE) SOLN 1part nystatin,1part Maaloxplus,1part benadryl,3part 2%viscous lidocaine. Swish/swallow 10 mL up to QID, 70min before meals/bedtime  480 mL  5  . Cholecalciferol (VITAMIN D3) 5000 UNITS CAPS Take 5,000 Units by mouth daily.      . citalopram (CELEXA) 20 MG tablet Take 20 mg by mouth daily.      [name] docusate sodium (COLACE) 100 MG capsule Take 100 mg by mouth 2 (two) times daily as needed for mild constipation.      [name] emollient (BIAFINE) cream Apply topically 2 (two) times daily.      [name] lidocaine-prilocaine (EMLA) cream Apply 1 application topically as needed.  30 g  0  . metFORMIN (GLUCOPHAGE) 500 MG tablet Take 500 mg by mouth 2 (two) times daily with a meal.      . ondansetron (ZOFRAN) 8 MG tablet Take 1 tablet (8 mg total) by mouth every 8 (eight) hours as needed for nausea.  60 tablet  3  . OxyCODONE (OXYCONTIN) 10 mg T12A 12 hr tablet Take 10 mg by mouth every 12 (twelve) hours.      [name] oxyCODONE-acetaminophen (ROXICET) 5-325 MG per  tablet Take 1-2 tablets by mouth every 4 (four) hours as needed for severe pain.  30 tablet  0  . pregabalin (LYRICA) 150 MG capsule Take 300 mg by mouth 2 (two) times daily.       . promethazine (PHENERGAN) 25 MG tablet Take 1 tablet (25 mg total) by mouth every 6 (six) hours as needed for nausea.  60 tablet  3  . sucralfate (CARAFATE) 1 G tablet dissolve 1 tablet in 10 mL H20 and swallow up to four times a day to soothe throat.  60 tablet  5  . morphine (ROXANOL) 20 MG/ML concentrated solution Take 0.5 mLs (10 mg total) by mouth every  2 (two) hours as needed for severe pain.  240 mL  0   No current facility-administered medications for this visit.   Facility-Administered Medications Ordered in Other Visits  Medication Dose Route Frequency Provider Last Rate Last Dose  . cetuximab (ERBITUX) chemo infusion 600 mg  250 mg/m2 (Treatment Plan Actual) Intravenous Once Heath Lark, MD      . heparin lock flush 100 unit/mL  500 Units Intracatheter Once PRN Heath Lark, MD      . sodium chloride 0.9 % injection 10 mL  10 mL Intracatheter PRN Heath Lark, MD        REVIEW OF SYSTEMS:   Constitutional: Denies fevers, chills or abnormal weight loss Eyes: Denies blurriness of vision Respiratory: Denies cough, dyspnea or wheezes Cardiovascular: Denies palpitation, chest discomfort or lower extremity swelling Gastrointestinal:  Denies nausea, heartburn or change in bowel habits Lymphatics: Denies new lymphadenopathy or easy bruising Neurological:Denies numbness, tingling or new weaknesses Behavioral/Psych: Mood is stable, no new changes  All other systems were reviewed with the patient and are negative.  PHYSICAL EXAMINATION: ECOG PERFORMANCE STATUS: 2 - Symptomatic, <50% confined to bed  Filed Vitals:   05/09/13 0936  BP: 124/75  Pulse: 98  Temp: 97.7 F (36.5 C)  Resp: 18   Filed Weights   05/09/13 0936  Weight: 253 lb 8 oz (114.987 kg)    GENERAL:alert, no distress and comfortable. He is morbidly obese SKIN: Noted mild acne around his face. No rash elsewhere. EYES: normal, Conjunctiva are pink and non-injected, sclera clear OROPHARYNX: Noted grade 1 mucositis with no ulceration. No thrush noted. NECK: supple, thyroid normal size, non-tender, without nodularity LYMPH:  Persistent palpable lymphadenopathy on both sides of his neck, none elsewhere.  LUNGS: clear to auscultation and percussion with normal breathing effort HEART: regular rate & rhythm and no murmurs and no lower extremity edema ABDOMEN:abdomen soft,  non-tender and normal bowel sounds. Feeding tube site looks very little bit erythematous but nothing to suspect infection Musculoskeletal:no cyanosis of digits and no clubbing  NEURO: alert & oriented x 3 with fluent speech, no focal motor/sensory deficits  LABORATORY DATA:  I have reviewed the data as listed    Component Value Date/Time   NA 138 05/09/2013 0927   NA 141 04/04/2013 0732   K 4.4 05/09/2013 0927   K 3.9 04/04/2013 0732   CL 103 04/04/2013 0732   CO2 24 05/09/2013 0927   CO2 25 04/04/2013 0732   GLUCOSE 256* 05/09/2013 0927   GLUCOSE 136* 04/04/2013 0732   BUN 15.8 05/09/2013 0927   BUN 12 04/04/2013 0732   CREATININE 0.8 05/09/2013 0927   CREATININE 0.72 04/04/2013 0732   CALCIUM 9.9 05/09/2013 0927   CALCIUM 9.3 04/04/2013 0732   PROT 8.4* 04/28/2013 1132   PROT 7.1 04/30/2010 0406  ALBUMIN 3.8 04/28/2013 1132   ALBUMIN 2.7* 04/30/2010 0406   AST 69* 04/28/2013 1132   AST 31 04/30/2010 0406   ALT 41 04/28/2013 1132   ALT 30 04/30/2010 0406   ALKPHOS 84 04/28/2013 1132   ALKPHOS 75 04/30/2010 0406   BILITOT 0.37 04/28/2013 1132   BILITOT 0.9 04/30/2010 0406   GFRNONAA >90 04/04/2013 0732   GFRAA >90 04/04/2013 0732    No results found for this basename: SPEP,  UPEP,   kappa and lambda light chains    Lab Results  Component Value Date   WBC 9.2 04/28/2013   NEUTROABS 6.2 04/28/2013   HGB 13.2 04/28/2013   HCT 40.6 04/28/2013   MCV 91.0 04/28/2013   PLT 189 04/28/2013      Chemistry      Component Value Date/Time   NA 138 05/09/2013 0927   NA 141 04/04/2013 0732   K 4.4 05/09/2013 0927   K 3.9 04/04/2013 0732   CL 103 04/04/2013 0732   CO2 24 05/09/2013 0927   CO2 25 04/04/2013 0732   BUN 15.8 05/09/2013 0927   BUN 12 04/04/2013 0732   CREATININE 0.8 05/09/2013 0927   CREATININE 0.72 04/04/2013 0732      Component Value Date/Time   CALCIUM 9.9 05/09/2013 0927   CALCIUM 9.3 04/04/2013 0732   ALKPHOS 84 04/28/2013 1132   ALKPHOS 75 04/30/2010 0406   AST 69* 04/28/2013 1132   AST 31  04/30/2010 0406   ALT 41 04/28/2013 1132   ALT 30 04/30/2010 0406   BILITOT 0.37 04/28/2013 1132   BILITOT 0.9 04/30/2010 0406     ASSESSMENT & PLAN:  #1 tonsil cancer His tolerating treatment poorly. We'll continue treatment without dosage adjustment. 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 #3 pressure sore around the feeding tube Continue vigilant monitoring #4 mucositis He has been prescribed Magic mouthwash. I told him to discontinue Percocet and I gave him prescription of liquid morphine to take as needed. We discussed some of the risks and side effects to be expected from his pain medicine #5 bowel function and feeding tube, replaced He will continue nutritional supplement per his feeding tube #6 mild acne rash This is due to his treatment. I will monitor closely. This is not severe enough to hold treatment or start him on any medications.  Orders Placed This Encounter  Procedures  . Basic metabolic panel    Standing Status: Standing     Number of Occurrences: 9     Standing Expiration Date: 05/10/2014  . Magnesium    Standing Status: Standing     Number of Occurrences: 9     Standing Expiration Date: 05/10/2014   All questions were answered. The patient knows to call the clinic with any problems, questions or concerns. No barriers to learning was detected. I spent 40 minutes counseling the patient face to face. The total time spent in the appointment was 55 minutes and more than 50% was on counseling and review of test results     Heath Lark, MD 05/09/2013 10:56 AM

## 2013-05-09 NOTE — Progress Notes (Signed)
Patient denies nutritional complaints.  Weight is stable overall and was documented as 254.2 pounds.  Noted glucose level elevated at 256.  Patient's G-tube required replacement.  He is flushing it daily with water and has no complaints.  Patient reports he has increased Ensure Plus by mouth to 6 cans a day.  He is able to tolerate soft foods.  He reports his throat is just slightly sore.  Nutrition diagnosis: Predicted suboptimal energy intake continues.  Intervention: Patient educated to continue soft, moist foods, and liquids as tolerated for weight maintenance.  Patient to continue flushing feeding tube with water.  Teach back method used.  Monitoring, evaluation, goals: Patient will tolerate adequate calories and protein for weight maintenance.  Once patient loses 5% of usual body weight, will begin tube feedings.  Next visit, May 4, during chemotherapy.

## 2013-05-09 NOTE — Telephone Encounter (Signed)
gv adn printed aptp sched and avs for pt for April and May.Marland KitchenMarland Kitchen

## 2013-05-09 NOTE — Telephone Encounter (Signed)
LMOM for pt's daughter b/c I called the pt's wife phone but n/a. I just wanted to check on them from late Friday pm seen in the urgent office by Dr Johney Maine and had to have g-tube replaced. I wanted to make sure they knew the g-tube is in place per the imaging flush that happened late Friday pm as well. I said they didn't have to call back unless if they have any questions b/c I left a detailed message of why I was calling the pt. I know the pt is very busy with all of the appt's at the [facility].

## 2013-05-09 NOTE — Progress Notes (Signed)
To provide support and care continuity, met with patient and his dtr during scheduled appt with Dr. Alvy Bimler.  They did not express any needs or concerns.  Continuing navigation as L1 patient (new patient).  Gayleen Orem, RN, BSN, [facility] Head & Neck Oncology Navigator [phone]

## 2013-05-09 NOTE — Progress Notes (Signed)
   Weekly Management Note:  outpatient Current Dose:  12 Gy  Projected Dose: 70 Gy   Narrative:  The patient presents for routine under treatment assessment.  CBCT/MVCT images/Port film x-rays were reviewed.  The chart was checked. Doing well with minimal complaints.  Physical Findings:  weight is 254 lb 3.2 oz (115.304 kg). His blood pressure is 132/72 and his pulse is 100.  "tumoritis" in right tonsil / palate. neck - acneiform rash, early, with b/l bulky neck adenopathy  CBC    Component Value Date/Time   WBC 9.2 04/28/2013 1132   WBC 8.8 04/04/2013 0732   RBC 4.46 04/28/2013 1132   RBC 4.89 04/04/2013 0732   HGB 13.2 04/28/2013 1132   HGB 15.0 04/04/2013 0732   HCT 40.6 04/28/2013 1132   HCT 43.9 04/04/2013 0732   PLT 189 04/28/2013 1132   PLT 171 04/04/2013 0732   MCV 91.0 04/28/2013 1132   MCV 89.8 04/04/2013 0732   MCH 29.6 04/28/2013 1132   MCH 30.7 04/04/2013 0732   MCHC 32.5 04/28/2013 1132   MCHC 34.2 04/04/2013 0732   RDW 14.1 04/28/2013 1132   RDW 14.3 04/04/2013 0732   LYMPHSABS 1.7 04/28/2013 1132   LYMPHSABS 1.4 10/17/2012 2222   MONOABS 0.7 04/28/2013 1132   MONOABS 0.5 10/17/2012 2222   EOSABS 0.5 04/28/2013 1132   EOSABS 0.3 10/17/2012 2222   BASOSABS 0.1 04/28/2013 1132   BASOSABS 0.0 10/17/2012 2222     CMP     Component Value Date/Time   NA 138 05/09/2013 0927   NA 141 04/04/2013 0732   K 4.4 05/09/2013 0927   K 3.9 04/04/2013 0732   CL 103 04/04/2013 0732   CO2 24 05/09/2013 0927   CO2 25 04/04/2013 0732   GLUCOSE 256* 05/09/2013 0927   GLUCOSE 136* 04/04/2013 0732   BUN 15.8 05/09/2013 0927   BUN 12 04/04/2013 0732   CREATININE 0.8 05/09/2013 0927   CREATININE 0.72 04/04/2013 0732   CALCIUM 9.9 05/09/2013 0927   CALCIUM 9.3 04/04/2013 0732   PROT 8.4* 04/28/2013 1132   PROT 7.1 04/30/2010 0406   ALBUMIN 3.8 04/28/2013 1132   ALBUMIN 2.7* 04/30/2010 0406   AST 69* 04/28/2013 1132   AST 31 04/30/2010 0406   ALT 41 04/28/2013 1132   ALT 30 04/30/2010 0406   ALKPHOS 84 04/28/2013  1132   ALKPHOS 75 04/30/2010 0406   BILITOT 0.37 04/28/2013 1132   BILITOT 0.9 04/30/2010 0406   GFRNONAA >90 04/04/2013 0732   GFRAA >90 04/04/2013 0732     Impression:  The patient is tolerating radiotherapy.   Plan:  Continue radiotherapy as planned.  Instructions- Baking Soda Rinse - 1 tsp salt, 1 tsp baking soda, 1 qt water - swish, gargle and spit as needed to soothe/cleanse mouth. Use as often as you want.  Sucralfate - crush 1 tablet in 10 mL H20 and swallow up to four times a day to soothe throat.  Magic mouthwash with Lidocaine - Swish/swallow 30mL, 30 minutes before meals and bedtime, to numb up sore mouth and/or throat. Alternate with Sucralfate.   -----------------------------------  Eppie Gibson, MD

## 2013-05-09 NOTE — Patient Instructions (Signed)
Baking Soda Rinse - 1 tsp salt, 1 tsp baking soda, 1 qt water - swish, gargle and spit as needed to soothe/cleanse mouth. Use as often as you want.  Sucralfate - crush 1 tablet in 10 mL H20 and swallow up to four times a day to soothe throat.  Magic mouthwash with Lidocaine - Swish/swallow 53mL, 30 minutes before meals and bedtime, to numb up sore mouth and/or throat. Alternate with Sucralfate.

## 2013-05-09 NOTE — Progress Notes (Signed)
61 -  Pt saw Dr. Alvy Bimler today prior to chemo.  OK to treat per md.   Pt stated pain mostly from diabetic neuropathy.  Rated level 5.   Percocet 2 tablets given po as per md. 1300  -  Pt stated completely pain relief.

## 2013-05-10 ENCOUNTER — Ambulatory Visit
Admission: RE | Admit: 2013-05-10 | Discharge: 2013-05-10 | Disposition: A | Discharge status: Home / Self Care | Payer: 59 | Source: Ambulatory Visit | Attending: Radiation Oncology | Admitting: Radiation Oncology

## 2013-05-11 ENCOUNTER — Ambulatory Visit
Admission: RE | Admit: 2013-05-11 | Discharge: 2013-05-11 | Disposition: A | Discharge status: Home / Self Care | Payer: 59 | Source: Ambulatory Visit | Attending: Radiation Oncology | Admitting: Radiation Oncology

## 2013-05-12 ENCOUNTER — Ambulatory Visit
Admission: RE | Admit: 2013-05-12 | Discharge: 2013-05-12 | Disposition: A | Discharge status: Home / Self Care | Payer: 59 | Source: Ambulatory Visit | Attending: Radiation Oncology | Admitting: Radiation Oncology

## 2013-05-13 ENCOUNTER — Other Ambulatory Visit: Payer: Self-pay | Admitting: Hematology and Oncology

## 2013-05-13 ENCOUNTER — Ambulatory Visit
Admission: RE | Admit: 2013-05-13 | Discharge: 2013-05-13 | Disposition: A | Discharge status: Home / Self Care | Payer: 59 | Source: Ambulatory Visit | Attending: Radiation Oncology | Admitting: Radiation Oncology

## 2013-05-13 DIAGNOSIS — C099 Malignant neoplasm of tonsil, unspecified: Secondary | ICD-10-CM

## 2013-05-16 ENCOUNTER — Ambulatory Visit: Payer: 59

## 2013-05-16 ENCOUNTER — Other Ambulatory Visit: Payer: Self-pay

## 2013-05-16 ENCOUNTER — Ambulatory Visit: Payer: 59 | Attending: Radiation Oncology

## 2013-05-16 ENCOUNTER — Encounter: Payer: Self-pay | Admitting: Nutrition

## 2013-05-16 ENCOUNTER — Ambulatory Visit: Payer: Self-pay

## 2013-05-16 ENCOUNTER — Other Ambulatory Visit: Payer: 59

## 2013-05-16 ENCOUNTER — Ambulatory Visit: Payer: Self-pay | Admitting: Hematology and Oncology

## 2013-05-16 ENCOUNTER — Ambulatory Visit (HOSPITAL_BASED_OUTPATIENT_CLINIC_OR_DEPARTMENT_OTHER): Payer: 59

## 2013-05-16 DIAGNOSIS — C099 Malignant neoplasm of tonsil, unspecified: Secondary | ICD-10-CM | POA: Insufficient documentation

## 2013-05-16 DIAGNOSIS — G589 Mononeuropathy, unspecified: Secondary | ICD-10-CM | POA: Insufficient documentation

## 2013-05-17 ENCOUNTER — Ambulatory Visit (HOSPITAL_COMMUNITY): Payer: Medicaid - Dental | Admitting: Dentistry

## 2013-05-17 ENCOUNTER — Ambulatory Visit
Admission: RE | Admit: 2013-05-17 | Discharge: 2013-05-17 | Disposition: A | Discharge status: Home / Self Care | Payer: 59 | Source: Ambulatory Visit | Attending: Radiation Oncology | Admitting: Radiation Oncology

## 2013-05-17 ENCOUNTER — Encounter (HOSPITAL_COMMUNITY): Payer: Self-pay | Admitting: Dentistry

## 2013-05-17 VITALS — BP 130/85 | HR 108 | Temp 98.7°F | Wt 253.0 lb

## 2013-05-17 DIAGNOSIS — K08109 Complete loss of teeth, unspecified cause, unspecified class: Secondary | ICD-10-CM

## 2013-05-17 DIAGNOSIS — R1310 Dysphagia, unspecified: Secondary | ICD-10-CM

## 2013-05-17 DIAGNOSIS — K117 Disturbances of salivary secretion: Secondary | ICD-10-CM

## 2013-05-17 DIAGNOSIS — C099 Malignant neoplasm of tonsil, unspecified: Secondary | ICD-10-CM

## 2013-05-17 DIAGNOSIS — R682 Dry mouth, unspecified: Secondary | ICD-10-CM

## 2013-05-17 DIAGNOSIS — R432 Parageusia: Secondary | ICD-10-CM

## 2013-05-17 DIAGNOSIS — Z0189 Encounter for other specified special examinations: Secondary | ICD-10-CM

## 2013-05-17 DIAGNOSIS — K000 Anodontia: Secondary | ICD-10-CM

## 2013-05-17 NOTE — Patient Instructions (Signed)
RECOMMENDATIONS: 1. Rinse mouth after meals and at bedtime.  Brush tongue daily . 2. Use trismus exercises as directed. 3. Use Biotene Rinse or salt water/baking soda rinses. 4. Multiple sips of water as needed. 5. Return to clinic in two months for periodic oral exam after radiation therapy. Call if problems before then.  Lenn Cal, DDS

## 2013-05-17 NOTE — Progress Notes (Signed)
05/17/2013  Patient:            [name] Date of Birth:  1950-09-22 MRN:                [id number]  BP 130/85  Pulse 108  Temp(Src) 98.7 F (37.1 C) (Oral)  Wt 253 lb (114.76 kg)  [name] presents for periodic oral examination during chemoradiation therapy. Patient has completed 11/35 radiation treatments.  REVIEW OF CHIEF COMPLAINTS:  DRY MOUTH: Yes HARD TO SWALLOW: Yes  HURT TO SWALLOW: Yes TASTE CHANGES: Still has some taste remaining. SORES IN MOUTH: Yes TRISMUS: No problems with trismus symptoms. WEIGHT: 253 pounds  HOME OH REGIMEN:  BRUSHING: Edentulous. Patient instructed to brush tongue daily. FLOSSING: Not applicable RINSING: Using salt water and baking soda rinses. Using Biotene and magic mouthwash as rinses as well. FLUORIDE: Not applicable TRISMUS EXERCISES:  Maximum interincisal opening: 53 mm. Patient is performing exercises daily.   DENTAL EXAM:  Oral Hygiene:(PLAQUE): Patient is edentulous. Patient reminded to brush his tongue daily. LOCATION OF MUCOSITIS: Left lateral tongue. Back of throat. DESCRIPTION OF SALIVA: Decreased saliva. Moderate xerostomia. ANY EXPOSED BONE: None noted OTHER WATCHED AREAS: Previous extraction sites. DX: Xerostomia, Dysgeusia, Dysphagia, Odynophagia, Weight Loss, Mucositis and edentulous  RECOMMENDATIONS: 1. Rinse after meals and at bedtime.  Brush tongue daily.  2. Use trismus exercises as directed. 3. Use Biotene Rinse or salt water/baking soda rinses. 4. Multiple sips of water as needed. 5. Return to clinic in two months for periodic oral exam after radiation therapy. Call if problems before then.  Lenn Cal, DDS

## 2013-05-18 ENCOUNTER — Telehealth: Payer: Self-pay | Admitting: *Deleted

## 2013-05-18 ENCOUNTER — Ambulatory Visit
Admission: RE | Admit: 2013-05-18 | Discharge: 2013-05-18 | Disposition: A | Discharge status: Home / Self Care | Payer: 59 | Source: Ambulatory Visit | Attending: Radiation Oncology | Admitting: Radiation Oncology

## 2013-05-19 ENCOUNTER — Telehealth: Payer: Self-pay | Admitting: *Deleted

## 2013-05-19 ENCOUNTER — Ambulatory Visit: Payer: 59

## 2013-05-19 NOTE — Telephone Encounter (Signed)
Patient's dtr Angel LVM stating [place] has developed a rash, blistering wh/ based on her description sounds like Erbitux SE.  I informed Dr. Alvy Bimler.  Tried to return her call but unable to leave message.  Gayleen Orem, RN, BSN, [facility] Head & Neck Oncology Navigator [phone]

## 2013-05-19 NOTE — Telephone Encounter (Signed)
Reached dtr by phone, explained that based on her description of skin rash/blisterin, it appears that her dad is having a characteristic response to Erbitux.  i explained that I had informed Dr. Alvy Bimler who would be able to send an Rx for a cream.  She verbalized understanding.  Gayleen Orem, RN, BSN, [facility] Head & Neck Oncology Navigator [phone]

## 2013-05-20 ENCOUNTER — Encounter: Payer: Self-pay | Admitting: Radiation Oncology

## 2013-05-20 ENCOUNTER — Other Ambulatory Visit: Payer: Self-pay | Admitting: Hematology and Oncology

## 2013-05-20 ENCOUNTER — Ambulatory Visit
Admission: RE | Admit: 2013-05-20 | Discharge: 2013-05-20 | Disposition: A | Discharge status: Home / Self Care | Payer: 59 | Source: Ambulatory Visit | Attending: Radiation Oncology | Admitting: Radiation Oncology

## 2013-05-20 ENCOUNTER — Telehealth: Payer: Self-pay | Admitting: *Deleted

## 2013-05-20 MED ORDER — HYDROCORTISONE 1 % EX CREA: 1.0000 "application " | TOPICAL_CREAM | Freq: Two times a day (BID) | CUTANEOUS | Status: DC

## 2013-05-20 MED ORDER — DIPHENHYDRAMINE HCL 25 MG PO CAPS: 25.0000 mg | ORAL_CAPSULE | Freq: Four times a day (QID) | ORAL | Status: DC | PRN

## 2013-05-20 MED ORDER — CLINDAMYCIN PHOSPHATE 1 % EX GEL: Freq: Two times a day (BID) | CUTANEOUS | Status: DC

## 2013-05-20 NOTE — Progress Notes (Signed)
Spoke with Mr. Tracz wife by phone today. She reports that she was able to get him in for RT this AM.  He is in severe pain despite taking Oxycontin 10mg  BID and morphine q 2hrs as prescribed prn.  He is moving his bowels well with colace.  I recommended 1) double up the oxycontin to 20 BID. Wife (who is a Marine scientist) reports he is not getting significantly sedated by his pain meds, and his breathing is stable with good O2 sats. She will continue to monitor.  2) Miralax may be used if signs of constipation develop.  3) See me on Monday after RT for reassessment.  She understands importance of minimizing missed treatments for maximum efficacy of RT.  -----------------------------------  Eppie Gibson, MD

## 2013-05-20 NOTE — Telephone Encounter (Signed)
Dr. Alvy Bimler came into office this morning on her day off to see pt in clinic.  Pt had already left center because he was told by this RN that Dr. Alvy Bimler was not in today.  Dr. Alvy Bimler asked RN to call pt's daughter with the following instructions, new medications;  1. Clindamycin gel twice daily to affected areas (erbitux rash).  Do not use before Radiation tx in the morning.  Apply after XRT.  2. Hydrocortisone 1% cream to affected areas twice a day at least one hour apart from clindamycin (do not apply before XRT).  3. Benadryl 25 mg three times daily as needed for itching.  4. Cancel Chemo on Monday 5/11.  5. POF sent for pt to have labs and see Dr. Alvy Bimler on Friday 5/15.    Called daughter, Glenard Haring and gave her above instructions.  Rx sent to pt's pharmacy.  She verbalized understanding.

## 2013-05-21 ENCOUNTER — Telehealth: Payer: Self-pay | Admitting: Hematology and Oncology

## 2013-05-21 NOTE — Telephone Encounter (Signed)
talked to pt's wife gave her appt for lab and MD for friday 5/15

## 2013-05-23 ENCOUNTER — Encounter: Payer: Self-pay | Admitting: Radiation Oncology

## 2013-05-23 ENCOUNTER — Ambulatory Visit
Admission: RE | Admit: 2013-05-23 | Discharge: 2013-05-23 | Disposition: A | Discharge status: Home / Self Care | Payer: 59 | Source: Ambulatory Visit | Attending: Radiation Oncology | Admitting: Radiation Oncology

## 2013-05-23 ENCOUNTER — Ambulatory Visit: Payer: 59 | Admitting: Nutrition

## 2013-05-23 ENCOUNTER — Ambulatory Visit: Payer: Self-pay

## 2013-05-23 VITALS — BP 106/70 | HR 103 | Temp 99.1°F | Ht 74.0 in | Wt 242.2 lb

## 2013-05-23 DIAGNOSIS — C099 Malignant neoplasm of tonsil, unspecified: Secondary | ICD-10-CM

## 2013-05-23 HISTORY — DX: Gastrostomy status: Z93.1

## 2013-05-23 NOTE — Progress Notes (Signed)
To provide support, encouragement and care continuity, met with pt and his family during weekly [place] appt with Dr. Isidore Moos.  Confirmed wife's understanding of G-tube care, encouraged its use for nutrition and hydration.  I will continue to provide follow-up instruction on tube use.  Continuing navigation as L2 patient (treatments established).  Gayleen Orem, RN, BSN, [facility] Head & Neck Oncology Navigator [phone]

## 2013-05-23 NOTE — Progress Notes (Signed)
Weekly Management Note:  outpatient Current Dose:  28 Gy  Projected Dose: 70 Gy   Narrative:  The patient presents for routine under treatment assessment.  CBCT/MVCT images/Port film x-rays were reviewed.  The chart was checked. He denies pain today and is taking 10 mg of morphine 2-3 times a day and oxycodone 20 mg q 12 hours. He reports the oxycodone increase helped with his pain. He reports a sore throat with swallowing and has not been eating as much due to odynophagia. He has lost 11 lbs since 05/17/13. Orthostatic vitals done bp sitting 118/78, hr 96, bp standing 106/70, hr 103. He is not using his peg tube now except to flush it daily. He is meeting with Ernestene Kiel today. His oral mucosa is red. He reports that his mouth is dry. He reports fatigue on and off. He has chemo 2 weeks ago. He has a red rash on his face and neck that he reports is from chemo. He is using clindamycin gel, biafine and hydrocortizone cream. The area around his peg tube insertion site is red and irritated. It is not draining. He is putting antibiotic ointment around it daily. Sutures in place, and plastic around ostomy is tight.  Patient/wife understand that sutures are to "fall out on their own". He hasn't started using PEG tube, yet, due to misconception that someone was supposed to tell them when to start.  They didn't set up a visit with [place] yet but wife is confident in how to administer tube feeds as she is an Therapist, sports.  He is using MMW and sucralfate. No vomiting or diarrhea.    Physical Findings:  height is 6\' 2"  (1.88 m) and weight is 242 lb 3.2 oz (109.861 kg). His temperature is 99.1 F (37.3 C). His blood pressure is 118/78 and his pulse is 96. His oxygen saturation is 96%.  erythematous acneiform rash  / dry desquamation over his face and neck. Tonsillar tumor appears to be regressing. He still has bilateral neck adenopathy but this appears to be decreasing. PEG tube site is red, with the external  plastic ring digging into the subcutaneous tissue. No sign of infection. Sutures in place.  Vitals - 1 value per visit 05/23/2013 05/17/2013 3/66/2947  SYSTOLIC 654 650 354  DIASTOLIC 78 85 72  Pulse 96 108 100  Temperature 99.1 98.7   Respirations     Weight (lb) 242.2 253 254.2  Height 6\' 2"     BMI 31.08 32.47 32.62  VISIT REPORT        CBC    Component Value Date/Time   WBC 9.2 04/28/2013 1132   WBC 8.8 04/04/2013 0732   RBC 4.46 04/28/2013 1132   RBC 4.89 04/04/2013 0732   HGB 13.2 04/28/2013 1132   HGB 15.0 04/04/2013 0732   HCT 40.6 04/28/2013 1132   HCT 43.9 04/04/2013 0732   PLT 189 04/28/2013 1132   PLT 171 04/04/2013 0732   MCV 91.0 04/28/2013 1132   MCV 89.8 04/04/2013 0732   MCH 29.6 04/28/2013 1132   MCH 30.7 04/04/2013 0732   MCHC 32.5 04/28/2013 1132   MCHC 34.2 04/04/2013 0732   RDW 14.1 04/28/2013 1132   RDW 14.3 04/04/2013 0732   LYMPHSABS 1.7 04/28/2013 1132   LYMPHSABS 1.4 10/17/2012 2222   MONOABS 0.7 04/28/2013 1132   MONOABS 0.5 10/17/2012 2222   EOSABS 0.5 04/28/2013 1132   EOSABS 0.3 10/17/2012 2222   BASOSABS 0.1 04/28/2013 1132   BASOSABS 0.0  10/17/2012 2222     CMP     Component Value Date/Time   NA 138 05/09/2013 0927   NA 141 04/04/2013 0732   K 4.4 05/09/2013 0927   K 3.9 04/04/2013 0732   CL 103 04/04/2013 0732   CO2 24 05/09/2013 0927   CO2 25 04/04/2013 0732   GLUCOSE 256* 05/09/2013 0927   GLUCOSE 136* 04/04/2013 0732   BUN 15.8 05/09/2013 0927   BUN 12 04/04/2013 0732   CREATININE 0.8 05/09/2013 0927   CREATININE 0.72 04/04/2013 0732   CALCIUM 9.9 05/09/2013 0927   CALCIUM 9.3 04/04/2013 0732   PROT 8.4* 04/28/2013 1132   PROT 7.1 04/30/2010 0406   ALBUMIN 3.8 04/28/2013 1132   ALBUMIN 2.7* 04/30/2010 0406   AST 69* 04/28/2013 1132   AST 31 04/30/2010 0406   ALT 41 04/28/2013 1132   ALT 30 04/30/2010 0406   ALKPHOS 84 04/28/2013 1132   ALKPHOS 75 04/30/2010 0406   BILITOT 0.37 04/28/2013 1132   BILITOT 0.9 04/30/2010 0406   GFRNONAA >90 04/04/2013 0732   GFRAA  >90 04/04/2013 0732     Impression:  The patient is tolerating radiotherapy.   Plan:  Continue radiotherapy as planned. Gayleen Orem and Dory Peru will make sure wife is administering tube feeds correctly.   If peg tube irritation worsens, patient needs to schedule to see surgeon again. Keep narcotics at current dose. Sees med/onc on 5-15.  Push PO and PEG tube fluids for hydration. B. Cordella Register will revisit caloric needs with patient in near future.  -----------------------------------  Eppie Gibson, MD

## 2013-05-23 NOTE — Progress Notes (Signed)
[name] has had 14 fractions to his right tonisil/bil neck.  He denies pain today and is taking 10 mg of morphine 2-3 times a day and oxycodone 20 mg q 12 hours.  He reports the oxycodone increase helped with his pain.  He reports a sore throat with swallowing and has not been eating as much.  He has lost 11 lbs since 05/17/13.  Orthostatic vitals done bp sitting 118/78, hr 96, bp standing 106/70, hr 103.  He is not using his peg tube now except to flush it daily.  He is meeting with Ernestene Kiel today.   His oral mucosa is red.  He reports that his mouth is dry.  He reports fatigue on and off.  He has chemo 2 weeks ago.  He has a red rash on his face and neck that he reports is from chemo.  He is using clindamycin gel, biafine and hydrocortizone cream.  The area around his peg tube insertion site is red and irritated.  It is not draining.  He is putting antibiotic ointment around it daily.

## 2013-05-23 NOTE — Progress Notes (Signed)
Patient having difficulty swallowing.  He is having treatment-related side effects, and throat is very sore.  His chemotherapy has been postponed. Patient is drinking four Ensure Plus a day with small amounts of food.  He has not been using his feeding tube for nutrition.  Patient states he is able to eat scrambled eggs, and pudding along with other soft foods.  Weight has decreased and was documented as 242.2 pounds May 11 down from 254.2 pounds.  Nutrition diagnosis: Predicted suboptimal energy intake has evolved into inadequate oral intake related to sore throat as evidenced by 12 pound weight loss.  Estimated nutrition needs 2250-2500 calories, 130-145 g protein, 2.5 L fluid daily.  Intervention: Patient educated to continue soft, moist foods at mealtimes along with Ensure Plus 4 times a day by mouth.  Patient will add Osmolite 1.5 - 3 times a day via feeding tube with 120 mL free water before and after each bolus tube feeding.  In addition, patient will drink a minimum of 16 ounces of water by mouth.  This provides a total of 2465 calories, 97 g protein, and 2436 mL free water.  Patient will get additional protein, calories and fluid by mouth.  Patient and wife and daughter educated on administering tube feedings.  Teach back method was used.  Questions were answered.  Patient was given a case of Osmolite 1.5.  Monitoring, evaluation, goals: Patient will tolerate adequate calories and protein by mouth and through feeding tube to minimize further weight loss.  Next visit: Monday, May 18, during chemotherapy.

## 2013-05-24 ENCOUNTER — Ambulatory Visit (HOSPITAL_COMMUNITY): Payer: Self-pay | Admitting: Dentistry

## 2013-05-24 ENCOUNTER — Ambulatory Visit
Admission: RE | Admit: 2013-05-24 | Discharge: 2013-05-24 | Disposition: A | Discharge status: Home / Self Care | Payer: 59 | Source: Ambulatory Visit | Attending: Radiation Oncology | Admitting: Radiation Oncology

## 2013-05-24 NOTE — Progress Notes (Signed)
Per 05/23/13 InBasket communication to Dr. Isidore Moos from Dr. Rosendo Gros,  I removed sutures from g-tube bolster ring to alleviate pressure on skin and the developed irritation.  Site reddened, minimal drainage at insertion site.   Cleaned site with NS, dried with 2x2 gauze, snugged ring to abdomen, placed split gauze under ring, secured with Medipore cloth tape.  Reviewed procedure with patient's dtr, Glenard Haring, who wasn't available at time of procedure.  She verbalized understanding.  Continuing navigation as L1 patient (new patient).  Gayleen Orem, RN, BSN, [facility] Head [facility] Neck Oncology Navigator [phone]

## 2013-05-25 ENCOUNTER — Ambulatory Visit
Admission: RE | Admit: 2013-05-25 | Discharge: 2013-05-25 | Disposition: A | Discharge status: Home / Self Care | Payer: 59 | Source: Ambulatory Visit | Attending: Radiation Oncology | Admitting: Radiation Oncology

## 2013-05-26 ENCOUNTER — Ambulatory Visit
Admission: RE | Admit: 2013-05-26 | Discharge: 2013-05-26 | Disposition: A | Discharge status: Home / Self Care | Payer: 59 | Source: Ambulatory Visit | Attending: Radiation Oncology | Admitting: Radiation Oncology

## 2013-05-26 ENCOUNTER — Telehealth: Payer: Self-pay | Admitting: Nutrition

## 2013-05-26 ENCOUNTER — Encounter: Payer: 59 | Admitting: Nutrition

## 2013-05-26 NOTE — Telephone Encounter (Signed)
Patient's wife called and reported that patient is no longer eating.  He has been unable to increase fluids.  He is tolerating 3 cans Osmolite 1.5 via feeding tube without difficulty.  Patient complaining about taste alterations.  Nutrition diagnosis: Inadequate oral intake continues.  Estimated nutrition needs: 2250-2500 calories, 130-145 g protein, 2.5 L fluid daily.  Intervention: Will increase tube feedings of Osmolite 1.5 to 1-1/2 cans 4 times a day with 120 mL free water before and after each bolus feeding.  In addition, patient will either drink or flush feeding tube with 240 cc twice a day for additional hydration.  Patient can continue to eat and drink what he is able.  Patient's goal of tube feeding will be 7 cans daily to provide 2485 calories, 104 g protein, 2707 mL free water.  Will increase tube feedings to goal rate after I evaluate tolerance.  Wife was instructed on tube feeding regimen.  Teach back method used.  Next visit: Will followup by phone with patient tomorrow and will see in chemotherapy on Monday, May 18.

## 2013-05-27 ENCOUNTER — Ambulatory Visit (HOSPITAL_BASED_OUTPATIENT_CLINIC_OR_DEPARTMENT_OTHER): Payer: 59

## 2013-05-27 ENCOUNTER — Telehealth: Payer: Self-pay | Admitting: *Deleted

## 2013-05-27 ENCOUNTER — Telehealth: Payer: Self-pay | Admitting: Hematology and Oncology

## 2013-05-27 ENCOUNTER — Ambulatory Visit (HOSPITAL_BASED_OUTPATIENT_CLINIC_OR_DEPARTMENT_OTHER): Payer: 59 | Admitting: Hematology and Oncology

## 2013-05-27 ENCOUNTER — Encounter: Payer: Self-pay | Admitting: Hematology and Oncology

## 2013-05-27 ENCOUNTER — Ambulatory Visit
Admission: RE | Admit: 2013-05-27 | Discharge: 2013-05-27 | Disposition: A | Discharge status: Home / Self Care | Payer: 59 | Source: Ambulatory Visit | Attending: Radiation Oncology | Admitting: Radiation Oncology

## 2013-05-27 VITALS — BP 113/67 | HR 92 | Temp 98.5°F | Resp 18 | Ht 74.0 in | Wt 241.8 lb

## 2013-05-27 DIAGNOSIS — L89899 Pressure ulcer of other site, unspecified stage: Secondary | ICD-10-CM

## 2013-05-27 DIAGNOSIS — R634 Abnormal weight loss: Secondary | ICD-10-CM

## 2013-05-27 DIAGNOSIS — C099 Malignant neoplasm of tonsil, unspecified: Secondary | ICD-10-CM

## 2013-05-27 DIAGNOSIS — R52 Pain, unspecified: Secondary | ICD-10-CM

## 2013-05-27 DIAGNOSIS — E1149 Type 2 diabetes mellitus with other diabetic neurological complication: Secondary | ICD-10-CM

## 2013-05-27 DIAGNOSIS — R110 Nausea: Secondary | ICD-10-CM

## 2013-05-27 DIAGNOSIS — R1310 Dysphagia, unspecified: Secondary | ICD-10-CM

## 2013-05-27 DIAGNOSIS — E1142 Type 2 diabetes mellitus with diabetic polyneuropathy: Secondary | ICD-10-CM

## 2013-05-27 DIAGNOSIS — T451X5A Adverse effect of antineoplastic and immunosuppressive drugs, initial encounter: Secondary | ICD-10-CM

## 2013-05-27 LAB — CBC WITH DIFFERENTIAL/PLATELET
BASO%: 0.6 % (ref 0.0–2.0)
Basophils Absolute: 0.1 10*3/uL (ref 0.0–0.1)
EOS%: 3.6 % (ref 0.0–7.0)
Eosinophils Absolute: 0.3 10*3/uL (ref 0.0–0.5)
HEMATOCRIT: 41.1 % (ref 38.4–49.9)
HGB: 13.3 g/dL (ref 13.0–17.1)
LYMPH#: 0.4 10*3/uL — AB (ref 0.9–3.3)
LYMPH%: 5.1 % — ABNORMAL LOW (ref 14.0–49.0)
MCH: 28.9 pg (ref 27.2–33.4)
MCHC: 32.4 g/dL (ref 32.0–36.0)
MCV: 89.3 fL (ref 79.3–98.0)
MONO#: 0.6 10*3/uL (ref 0.1–0.9)
MONO%: 7 % (ref 0.0–14.0)
NEUT#: 7.3 10*3/uL — ABNORMAL HIGH (ref 1.5–6.5)
NEUT%: 83.7 % — ABNORMAL HIGH (ref 39.0–75.0)
NRBC: 0 % (ref 0–0)
Platelets: 253 10*3/uL (ref 140–400)
RBC: 4.6 10*6/uL (ref 4.20–5.82)
RDW: 13.6 % (ref 11.0–14.6)
WBC: 8.7 10*3/uL (ref 4.0–10.3)

## 2013-05-27 LAB — BASIC METABOLIC PANEL (CC13)
Anion Gap: 11 mEq/L (ref 3–11)
BUN: 12 mg/dL (ref 7.0–26.0)
CALCIUM: 9.8 mg/dL (ref 8.4–10.4)
CHLORIDE: 100 meq/L (ref 98–109)
CO2: 28 mEq/L (ref 22–29)
Creatinine: 0.8 mg/dL (ref 0.7–1.3)
Glucose: 168 mg/dl — ABNORMAL HIGH (ref 70–140)
Potassium: 4.5 mEq/L (ref 3.5–5.1)
Sodium: 139 mEq/L (ref 136–145)

## 2013-05-27 LAB — MAGNESIUM (CC13): MAGNESIUM: 2.4 mg/dL (ref 1.5–2.5)

## 2013-05-27 MED ORDER — FENTANYL 75 MCG/HR TD PT72: 75.0000 ug | MEDICATED_PATCH | TRANSDERMAL | Status: DC

## 2013-05-27 NOTE — Telephone Encounter (Signed)
Per staff message and POF I have scheduled appts.  JMW  

## 2013-05-27 NOTE — Progress Notes (Signed)
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 There is also a left thyroid mass measured 44 mm   03/21/2013 Procedure The patient was seen by ENT with laryngoscopy and biopsy. Pathology is pending   03/25/2013 Imaging PET scan showed large hypermetabolic soft tissue mass in the region of the right palatine tonsil with bilateral cervical hypermetabolic lymphadenopathy indicative of metastatic disease,   04/04/2013 Surgery He underwent placement of Port-A-Cath and feeding tube   04/14/2013 Procedure The patient underwent teeth extraction.    INTERVAL HISTORY: [name] 63 y.o. male returns for further followup. The patient has not returned here for almost 2 weeks due to him not feeling well and miscommunication. Since the last time I saw him, he developed significant skin ulceration from side effects of treatment. He was prescribed topical hydrocortisone cream and clindamycin cream. It is improving slowly. He also has uncontrolled pain, rating his pain at about 8/10. He has some  nausea but no vomiting. He has lost a lot of weight due to difficulties with nutritional supplements. He has significant difficulties with swallowing. His blood sugar has been running low and his wife has discontinued his diabetic medications.  I have reviewed the past medical history, past surgical history, social history and family history with the patient and they are unchanged from previous note.  ALLERGIES:  is allergic to neurontin and dilaudid.  MEDICATIONS:  Current Outpatient Prescriptions  Medication Sig Dispense Refill  . acetaminophen (TYLENOL) 500 MG tablet Take 500-1,000 mg by mouth every 4 (four) hours as needed for mild pain.       [name] alprazolam (XANAX) 2 MG tablet Take 2 mg by mouth 3 (three) times daily as needed for sleep or anxiety.       . Alum & Mag Hydroxide-Simeth (MAGIC MOUTHWASH W/LIDOCAINE) SOLN 1part nystatin,1part Maaloxplus,1part benadryl,3part 2%viscous lidocaine. Swish/swallow 10 mL up to QID, 68min before meals/bedtime  480 mL  5  . Cholecalciferol (VITAMIN D3) 5000 UNITS CAPS Take 5,000 Units by mouth daily.      . citalopram (CELEXA) 20 MG tablet Take 20 mg by mouth daily.      . clindamycin (CLINDAGEL) 1 % gel Apply topically 2 (two) times daily. To affected area.  60 g  0  . docusate sodium (COLACE) 100 MG capsule Take 100 mg by mouth 2 (two) times daily as needed for mild constipation.      [name] emollient (BIAFINE) cream Apply topically 2 (two) times daily.      . hydrocortisone cream 1 % Apply 1 application topically 2 (two)  times daily.  30 g  0  . lidocaine-prilocaine (EMLA) cream Apply 1 application topically as needed.  30 g  0  . morphine (ROXANOL) 20 MG/ML concentrated solution Take 0.5 mLs (10 mg total) by mouth every 2 (two) hours as needed for severe pain.  240 mL  0  . ondansetron (ZOFRAN) 8 MG tablet Take 1 tablet (8 mg total) by mouth every 8 (eight) hours as needed for nausea.  60 tablet  3  . OxyCODONE (OXYCONTIN) 10 mg T12A 12 hr tablet Take  10 mg by mouth every 12 (twelve) hours.      . pregabalin (LYRICA) 150 MG capsule Take 300 mg by mouth 2 (two) times daily.       . promethazine (PHENERGAN) 25 MG tablet Take 1 tablet (25 mg total) by mouth every 6 (six) hours as needed for nausea.  60 tablet  3  . sucralfate (CARAFATE) 1 G tablet dissolve 1 tablet in 10 mL H20 and swallow up to four times a day to soothe throat.  60 tablet  5  . diphenhydrAMINE (BENADRYL) 25 mg capsule Take 1 capsule (25 mg total) by mouth every 6 (six) hours as needed for itching.  60 capsule  0  . fentaNYL (DURAGESIC - DOSED MCG/HR) 75 MCG/HR Place 1 patch (75 mcg total) onto the skin every 3 (three) days.  5 patch  0  . metFORMIN (GLUCOPHAGE) 500 MG tablet Take 500 mg by mouth 2 (two) times daily with a meal.       No current facility-administered medications for this visit.    REVIEW OF SYSTEMS:   Constitutional: Denies fevers, chills  Eyes: Denies blurriness of vision Respiratory: Denies cough, dyspnea or wheezes Cardiovascular: Denies palpitation, chest discomfort or lower extremity swelling Lymphatics: Denies new lymphadenopathy or easy bruising Neurological:Denies numbness, tingling or new weaknesses Behavioral/Psych: Mood is stable, no new changes  All other systems were reviewed with the patient and are negative.  PHYSICAL EXAMINATION: ECOG PERFORMANCE STATUS: 2 - Symptomatic, <50% confined to bed  Filed Vitals:   05/27/13 0912  BP: 113/67  Pulse: 92  Temp: 98.5 F (36.9 C)  Resp: 18   Filed Weights   05/27/13 0912  Weight: 241 lb 12.8 oz (109.68 kg)    GENERAL:alert, no distress and comfortable. He is obese SKIN: Significant skin ulceration around his neck. Also have mild fine rash consistent with side effects from treatment of Erbitux.[name]  EYES: normal, Conjunctiva are pink and non-injected, sclera clear OROPHARYNX significant ulcerated mucositis. No thrush.  NECK: supple, thyroid normal size, non-tender, without nodularity LYMPH:   Persistent palpable lymphadenopathy in his neck, none elsewhere.  LUNGS: clear to auscultation and percussion with normal breathing effort HEART: regular rate & rhythm and no murmurs and no lower extremity edema ABDOMEN:abdomen soft, non-tender and normal bowel sounds. Feeding tube site looks okay Musculoskeletal:no cyanosis of digits and no clubbing  NEURO: alert & oriented x 3 with fluent speech, no focal motor/sensory deficits  LABORATORY DATA:  I have reviewed the data as listed    Component Value Date/Time   NA 139 05/27/2013 0850   NA 141 04/04/2013 0732   K 4.5 05/27/2013 0850   K 3.9 04/04/2013 0732   CL 103 04/04/2013 0732   CO2 28 05/27/2013 0850   CO2 25 04/04/2013 0732   GLUCOSE 168* 05/27/2013 0850   GLUCOSE 136* 04/04/2013 0732   BUN 12.0 05/27/2013 0850   BUN 12 04/04/2013 0732   CREATININE 0.8 05/27/2013 0850  CREATININE 0.72 04/04/2013 0732   CALCIUM 9.8 05/27/2013 0850   CALCIUM 9.3 04/04/2013 0732   PROT 8.4* 04/28/2013 1132   PROT 7.1 04/30/2010 0406   ALBUMIN 3.8 04/28/2013 1132   ALBUMIN 2.7* 04/30/2010 0406   AST 69* 04/28/2013 1132   AST 31 04/30/2010 0406   ALT 41 04/28/2013 1132   ALT 30 04/30/2010 0406   ALKPHOS 84 04/28/2013 1132   ALKPHOS 75 04/30/2010 0406   BILITOT 0.37 04/28/2013 1132   BILITOT 0.9 04/30/2010 0406   GFRNONAA >90 04/04/2013 0732   GFRAA >90 04/04/2013 0732    No results found for this basename: SPEP,  UPEP,   kappa and lambda light chains    Lab Results  Component Value Date   WBC 8.7 05/27/2013   NEUTROABS 7.3* 05/27/2013   HGB 13.3 05/27/2013   HCT 41.1 05/27/2013   MCV 89.3 05/27/2013   PLT 253 05/27/2013      Chemistry      Component Value Date/Time   NA 139 05/27/2013 0850   NA 141 04/04/2013 0732   K 4.5 05/27/2013 0850   K 3.9 04/04/2013 0732   CL 103 04/04/2013 0732   CO2 28 05/27/2013 0850   CO2 25 04/04/2013 0732   BUN 12.0 05/27/2013 0850   BUN 12 04/04/2013 0732   CREATININE 0.8 05/27/2013 0850   CREATININE 0.72 04/04/2013 0732       Component Value Date/Time   CALCIUM 9.8 05/27/2013 0850   CALCIUM 9.3 04/04/2013 0732   ALKPHOS 84 04/28/2013 1132   ALKPHOS 75 04/30/2010 0406   AST 69* 04/28/2013 1132   AST 31 04/30/2010 0406   ALT 41 04/28/2013 1132   ALT 30 04/30/2010 0406   BILITOT 0.37 04/28/2013 1132   BILITOT 0.9 04/30/2010 0406     ASSESSMENT & PLAN:  #1 tonsil cancer His tolerating treatment poorly. We'll continue treatment next week.  I will dose his treatment intermittently depending on how much side effects. He may not get all the planned 7 treatment. 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 I recommend discontinuation of his diabetic medications due to recent weight loss #3 pressure sore around the feeding tube, improving Continue vigilant monitoring and local wound care. #4 mucositis with severe throat pain He has been prescribed Magic mouthwash. He is not tolerating extended release morphine well with difficulties with swallowing. I plan to start him on fentanyl patch 75 mcg and to continue on liquid morphine as needed for breakthrough pain medicine. I will reassess his pain control next week. #5 malnutrition with weight loss He will continue nutritional supplement per his feeding tube. He follows closely with the nutritionist #6 acneform rash due to Erbitux This is due to his treatment. He started on clindamycin cream and topical hydrocortisone. I will hold off starting him on antibiotic treatment. #7 radiation-induced skin injury Continue topical emollient cream.  All questions were answered. The patient knows to call the clinic with any problems, questions or concerns. No barriers to learning was detected.    Heath Lark, MD 05/27/2013 2:33 PM

## 2013-05-27 NOTE — Telephone Encounter (Signed)
gave pt appt for Md , emailed [place] regarding chemo

## 2013-05-30 ENCOUNTER — Ambulatory Visit
Admission: RE | Admit: 2013-05-30 | Discharge: 2013-05-30 | Disposition: A | Discharge status: Home / Self Care | Payer: 59 | Source: Ambulatory Visit | Attending: Radiation Oncology | Admitting: Radiation Oncology

## 2013-05-30 ENCOUNTER — Ambulatory Visit: Payer: 59 | Admitting: Nutrition

## 2013-05-30 ENCOUNTER — Ambulatory Visit (HOSPITAL_BASED_OUTPATIENT_CLINIC_OR_DEPARTMENT_OTHER): Payer: 59

## 2013-05-30 VITALS — BP 111/65 | HR 98 | Temp 98.9°F | Resp 18

## 2013-05-30 DIAGNOSIS — C099 Malignant neoplasm of tonsil, unspecified: Secondary | ICD-10-CM

## 2013-05-30 DIAGNOSIS — Z5112 Encounter for antineoplastic immunotherapy: Secondary | ICD-10-CM

## 2013-05-30 MED ORDER — DIPHENHYDRAMINE HCL 50 MG/ML IJ SOLN
INTRAMUSCULAR | Status: AC
  Filled 2013-05-30: qty 1

## 2013-05-30 MED ORDER — HEPARIN SOD (PORK) LOCK FLUSH 100 UNIT/ML IV SOLN
500.0000 [IU] | Freq: Once | INTRAVENOUS | Status: AC | PRN
  Administered 2013-05-30: 500 [IU]
  Filled 2013-05-30: qty 5

## 2013-05-30 MED ORDER — CETUXIMAB CHEMO IV INJECTION 200 MG/100ML
250.0000 mg/m2 | Freq: Once | INTRAVENOUS | Status: AC
  Administered 2013-05-30: 600 mg via INTRAVENOUS
  Filled 2013-05-30: qty 300

## 2013-05-30 MED ORDER — SODIUM CHLORIDE 0.9 % IJ SOLN
10.0000 mL | INTRAMUSCULAR | Status: DC | PRN
  Administered 2013-05-30: 10 mL
  Filled 2013-05-30: qty 10

## 2013-05-30 MED ORDER — SODIUM CHLORIDE 0.9 % IV SOLN
Freq: Once | INTRAVENOUS | Status: AC
  Administered 2013-05-30: 10:00:00 via INTRAVENOUS

## 2013-05-30 MED ORDER — DIPHENHYDRAMINE HCL 50 MG/ML IJ SOLN
50.0000 mg | Freq: Once | INTRAMUSCULAR | Status: AC
  Administered 2013-05-30: 50 mg via INTRAVENOUS

## 2013-05-30 NOTE — Progress Notes (Signed)
Nutrition followup completed with patient and daughter in chemotherapy.  Is receiving chemotherapy for tonsil cancer.  Tolerating 6 cans of Osmolite 1.5 daily, via feeding tube without difficulty.  Patient has been able to eat a little bit more since increasing tube feedings.  Patient is drinking Ensure Plus and has tolerated some pured foods.  Patient denies nausea and vomiting, constipation, or diarrhea.  Patient reports weight increased to 243 pounds today and 18.  Blood sugars better controlled and patient has come off oral diabetic medications per physician instruction.  Estimated nutrition needs 2250-2500 calories, 130-145 g protein, 2.5 L fluid daily.  6 cans Osmolite 1.5 with 120 mL free water before and after each bolus feeding provides 2130 calories, 89.4 g protein, 2046 mL free water.  Nutrition diagnosis: Inadequate oral intake continues but has improved.  Intervention: Patient educated to continue 6 cans of Osmolite 1.5 via feeding tube with 120 mL free water before and after each bolus feeding.  Patient should continue 1-1/2 cans 4 times a day.  Patient educated to continue high-protein foods and oral nutrition supplements as tolerated to provide additional protein and calories to promote weight maintenance.  Provided patient with 6 cases complementary Osmolite 1.5.  Next visit: Monday, June 8, during chemotherapy by relief dietitian.

## 2013-05-30 NOTE — Progress Notes (Signed)
Weekly Management Note:  Site: Right Tonsil/bilateral neck Current Dose:  3800  cGy Projected Dose: 7000  cGy  Narrative: The patient is seen today for routine under treatment assessment. CBCT/MVCT images/port films were reviewed. The chart was reviewed.   He is generally doing well. He does have an actiniform rash secondary to Erbitux. He is on clindamycin cream, and also Biafine cream. His weight has been stable for the past 4 days. He has 4 PEG tube feedings a day and is also able to supplement with some by mouth intake.  Physical Examination: There were no vitals filed for this visit..  Weight:  . There is an actiniform rash along his neck and upper chest. On inspection of the oropharynx there appears to be a confluent mucositis on the oropharynx, right greater than left.  Laboratory data: Lab Results  Component Value Date   WBC 8.7 05/27/2013   HGB 13.3 05/27/2013   HCT 41.1 05/27/2013   MCV 89.3 05/27/2013   PLT 253 05/27/2013     Impression: Tolerating radiation therapy well.  Plan: Continue radiation therapy as planned.

## 2013-05-30 NOTE — Patient Instructions (Signed)
[facility] Discharge Instructions for Patients Receiving Chemotherapy  Today you received the following chemotherapy agents: Erbitux  To help prevent nausea and vomiting after your treatment, we encourage you to take your nausea medication as prescribed. If you develop nausea and vomiting that is not controlled by your nausea medication, call the clinic.   BELOW ARE SYMPTOMS THAT SHOULD BE REPORTED IMMEDIATELY:  *FEVER GREATER THAN 100.5 F  *CHILLS WITH OR WITHOUT FEVER  NAUSEA AND VOMITING THAT IS NOT CONTROLLED WITH YOUR NAUSEA MEDICATION  *UNUSUAL SHORTNESS OF BREATH  *UNUSUAL BRUISING OR BLEEDING  TENDERNESS IN MOUTH AND THROAT WITH OR WITHOUT PRESENCE OF ULCERS  *URINARY PROBLEMS  *BOWEL PROBLEMS  UNUSUAL RASH Items with * indicate a potential emergency and should be followed up as soon as possible.  Feel free to call the clinic you have any questions or concerns. The clinic phone number is [phone].

## 2013-05-31 ENCOUNTER — Telehealth: Payer: Self-pay | Admitting: Hematology and Oncology

## 2013-05-31 ENCOUNTER — Ambulatory Visit
Admission: RE | Admit: 2013-05-31 | Discharge: 2013-05-31 | Disposition: A | Discharge status: Home / Self Care | Payer: 59 | Source: Ambulatory Visit | Attending: Radiation Oncology | Admitting: Radiation Oncology

## 2013-05-31 NOTE — Telephone Encounter (Signed)
Talked to pt and gave him appt for lab,md and chemo for May and June 2015

## 2013-06-01 ENCOUNTER — Ambulatory Visit
Admission: RE | Admit: 2013-06-01 | Discharge: 2013-06-01 | Disposition: A | Discharge status: Home / Self Care | Payer: 59 | Source: Ambulatory Visit | Attending: Radiation Oncology | Admitting: Radiation Oncology

## 2013-06-02 ENCOUNTER — Ambulatory Visit (HOSPITAL_BASED_OUTPATIENT_CLINIC_OR_DEPARTMENT_OTHER): Payer: 59 | Admitting: Hematology and Oncology

## 2013-06-02 ENCOUNTER — Telehealth: Payer: Self-pay | Admitting: Hematology and Oncology

## 2013-06-02 ENCOUNTER — Ambulatory Visit: Payer: 59

## 2013-06-02 ENCOUNTER — Ambulatory Visit
Admission: RE | Admit: 2013-06-02 | Discharge: 2013-06-02 | Disposition: A | Discharge status: Home / Self Care | Payer: 59 | Source: Ambulatory Visit | Attending: Radiation Oncology | Admitting: Radiation Oncology

## 2013-06-02 VITALS — BP 110/51 | HR 95 | Temp 97.3°F | Resp 20 | Ht 74.0 in | Wt 243.2 lb

## 2013-06-02 DIAGNOSIS — E1142 Type 2 diabetes mellitus with diabetic polyneuropathy: Secondary | ICD-10-CM

## 2013-06-02 DIAGNOSIS — E46 Unspecified protein-calorie malnutrition: Secondary | ICD-10-CM

## 2013-06-02 DIAGNOSIS — E1140 Type 2 diabetes mellitus with diabetic neuropathy, unspecified: Secondary | ICD-10-CM

## 2013-06-02 DIAGNOSIS — K1230 Oral mucositis (ulcerative), unspecified: Secondary | ICD-10-CM

## 2013-06-02 DIAGNOSIS — E440 Moderate protein-calorie malnutrition: Secondary | ICD-10-CM

## 2013-06-02 DIAGNOSIS — C099 Malignant neoplasm of tonsil, unspecified: Secondary | ICD-10-CM

## 2013-06-02 DIAGNOSIS — L98499 Non-pressure chronic ulcer of skin of other sites with unspecified severity: Secondary | ICD-10-CM

## 2013-06-02 DIAGNOSIS — K121 Other forms of stomatitis: Secondary | ICD-10-CM

## 2013-06-02 DIAGNOSIS — E1149 Type 2 diabetes mellitus with other diabetic neurological complication: Secondary | ICD-10-CM

## 2013-06-02 LAB — CBC WITH DIFFERENTIAL/PLATELET
BASO%: 0.5 % (ref 0.0–2.0)
BASOS ABS: 0 10*3/uL (ref 0.0–0.1)
EOS ABS: 0.3 10*3/uL (ref 0.0–0.5)
EOS%: 4.2 % (ref 0.0–7.0)
HCT: 38.7 % (ref 38.4–49.9)
HEMOGLOBIN: 12.4 g/dL — AB (ref 13.0–17.1)
LYMPH%: 4.4 % — ABNORMAL LOW (ref 14.0–49.0)
MCH: 28.6 pg (ref 27.2–33.4)
MCHC: 32 g/dL (ref 32.0–36.0)
MCV: 89.4 fL (ref 79.3–98.0)
MONO#: 0.6 10*3/uL (ref 0.1–0.9)
MONO%: 7.8 % (ref 0.0–14.0)
NEUT%: 83.1 % — ABNORMAL HIGH (ref 39.0–75.0)
NEUTROS ABS: 6.3 10*3/uL (ref 1.5–6.5)
Platelets: 219 10*3/uL (ref 140–400)
RBC: 4.33 10*6/uL (ref 4.20–5.82)
RDW: 13.6 % (ref 11.0–14.6)
WBC: 7.6 10*3/uL (ref 4.0–10.3)
lymph#: 0.3 10*3/uL — ABNORMAL LOW (ref 0.9–3.3)

## 2013-06-02 LAB — BASIC METABOLIC PANEL (CC13)
Anion Gap: 10 mEq/L (ref 3–11)
BUN: 10.8 mg/dL (ref 7.0–26.0)
CHLORIDE: 99 meq/L (ref 98–109)
CO2: 30 meq/L — AB (ref 22–29)
CREATININE: 0.7 mg/dL (ref 0.7–1.3)
Calcium: 9.4 mg/dL (ref 8.4–10.4)
Glucose: 165 mg/dl — ABNORMAL HIGH (ref 70–140)
Potassium: 4.5 mEq/L (ref 3.5–5.1)
Sodium: 138 mEq/L (ref 136–145)

## 2013-06-02 LAB — MAGNESIUM (CC13): Magnesium: 2.3 mg/dl (ref 1.5–2.5)

## 2013-06-02 NOTE — Progress Notes (Signed)
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 There is also a left thyroid mass measured 44 mm   03/21/2013 Procedure The patient was seen by ENT with laryngoscopy and biopsy. Pathology is pending   03/25/2013 Imaging PET scan showed large hypermetabolic soft tissue mass in the region of the right palatine tonsil with bilateral cervical hypermetabolic lymphadenopathy indicative of metastatic disease,   04/04/2013 Surgery He underwent placement of Port-A-Cath and feeding tube   04/14/2013 Procedure The patient underwent teeth extraction.    INTERVAL HISTORY: [name] 63 y.o. male returns for further followup. He is doing well. He is gaining weight. He still had persistent skin ulceration from side effects of treatment, improving. His pain is under excellent control with current prescription fentanyl patch at 75 mcg every 3 days. He has some nausea but no vomiting. Overall, he feels better compared to his visit last week.  I have reviewed the past medical history, past surgical  history, social history and family history with the patient and they are unchanged from previous note.  ALLERGIES:  is allergic to neurontin and dilaudid.  MEDICATIONS:  Current Outpatient Prescriptions  Medication Sig Dispense Refill  . acetaminophen (TYLENOL) 500 MG tablet Take 500-1,000 mg by mouth every 4 (four) hours as needed for mild pain.       [name] alprazolam (XANAX) 2 MG tablet Take 2 mg by mouth 3 (three) times daily as needed for sleep or anxiety.       . Alum & Mag Hydroxide-Simeth (MAGIC MOUTHWASH W/LIDOCAINE) SOLN 1part nystatin,1part Maaloxplus,1part benadryl,3part 2%viscous lidocaine. Swish/swallow 10 mL up to QID, 43min before meals/bedtime  480 mL  5  . Cholecalciferol (VITAMIN D3) 5000 UNITS CAPS Take 5,000 Units by mouth daily.      . citalopram (CELEXA) 20 MG tablet Take 20 mg by mouth daily.      . clindamycin (CLINDAGEL) 1 % gel Apply topically 2 (two) times daily. To affected area.  60 g  0  . diphenhydrAMINE (BENADRYL) 25 mg capsule Take 1 capsule (25 mg total) by mouth every 6 (six) hours as needed for itching.  60 capsule  0  . docusate sodium (COLACE) 100 MG capsule Take 100 mg by mouth 2 (two) times daily as needed for mild constipation.      [name] emollient (BIAFINE) cream Apply topically 2 (two) times daily.      . fentaNYL (DURAGESIC - DOSED MCG/HR) 75 MCG/HR Place 1 patch (75 mcg total) onto the skin every 3 (three) days.  5 patch  0  . hydrocortisone cream  1 % Apply 1 application topically 2 (two) times daily.  30 g  0  . lidocaine-prilocaine (EMLA) cream Apply 1 application topically as needed.  30 g  0  . morphine (ROXANOL) 20 MG/ML concentrated solution Take 0.5 mLs (10 mg total) by mouth every 2 (two) hours as needed for severe pain.  240 mL  0  . ondansetron (ZOFRAN) 8 MG tablet Take 1 tablet (8 mg total) by mouth every 8 (eight) hours as needed for nausea.  60 tablet  3  . pregabalin (LYRICA) 150 MG capsule Take 300 mg by mouth 2 (two) times daily.       .  promethazine (PHENERGAN) 25 MG tablet Take 1 tablet (25 mg total) by mouth every 6 (six) hours as needed for nausea.  60 tablet  3  . sucralfate (CARAFATE) 1 G tablet dissolve 1 tablet in 10 mL H20 and swallow up to four times a day to soothe throat.  60 tablet  5   No current facility-administered medications for this visit.    REVIEW OF SYSTEMS:   Constitutional: Denies fevers, chills or abnormal weight loss Eyes: Denies blurriness of vision Respiratory: Denies cough, dyspnea or wheezes Cardiovascular: Denies palpitation, chest discomfort or lower extremity swelling Lymphatics: Denies new lymphadenopathy or easy bruising Neurological:Denies numbness, tingling or new weaknesses Behavioral/Psych: Mood is stable, no new changes  All other systems were reviewed with the patient and are negative.  PHYSICAL EXAMINATION: ECOG PERFORMANCE STATUS: 1 - Symptomatic but completely ambulatory  Filed Vitals:   06/02/13 0919  BP: 110/51  Pulse: 95  Temp: 97.3 F (36.3 C)  Resp: 20   Filed Weights   06/02/13 0919  Weight: 243 lb 3.2 oz (110.315 kg)    GENERAL:alert, no distress and comfortable. He is morbidly obese SKIN: Significant rash related to Erbitux. He also has significant radiation-induced skin injury around his neck.  EYES: normal, Conjunctiva are pink and non-injected, sclera clear OROPHARYNX: There is presence of ulceration from mucositis. No thrush. Unable to appreciate oropharyngeal cancer NECK: Not examined due to significant skin changes  LYMPH:  no palpable lymphadenopathy in the cervical, axillary or inguinal LUNGS: clear to auscultation and percussion with normal breathing effort HEART: regular rate & rhythm and no murmurs and no lower extremity edema ABDOMEN:abdomen soft, non-tender and normal bowel sounds. Feeding tube site looks okay Musculoskeletal:no cyanosis of digits and no clubbing  NEURO: alert & oriented x 3 with fluent speech, no focal motor/sensory  deficits  LABORATORY DATA:  I have reviewed the data as listed    Component Value Date/Time   NA 139 05/27/2013 0850   NA 141 04/04/2013 0732   K 4.5 05/27/2013 0850   K 3.9 04/04/2013 0732   CL 103 04/04/2013 0732   CO2 28 05/27/2013 0850   CO2 25 04/04/2013 0732   GLUCOSE 168* 05/27/2013 0850   GLUCOSE 136* 04/04/2013 0732   BUN 12.0 05/27/2013 0850   BUN 12 04/04/2013 0732   CREATININE 0.8 05/27/2013 0850   CREATININE 0.72 04/04/2013 0732   CALCIUM 9.8 05/27/2013 0850   CALCIUM 9.3 04/04/2013 0732   PROT 8.4* 04/28/2013 1132   PROT 7.1 04/30/2010 0406   ALBUMIN 3.8 04/28/2013 1132   ALBUMIN 2.7* 04/30/2010 0406   AST 69* 04/28/2013 1132   AST 31 04/30/2010 0406   ALT 41 04/28/2013 1132   ALT 30 04/30/2010 0406   ALKPHOS 84 04/28/2013 1132   ALKPHOS 75 04/30/2010 0406   BILITOT 0.37 04/28/2013 1132  BILITOT 0.9 04/30/2010 0406   GFRNONAA >90 04/04/2013 0732   GFRAA >90 04/04/2013 0732    No results found for this basename: SPEP, UPEP,  kappa and lambda light chains    Lab Results  Component Value Date   WBC 8.7 05/27/2013   NEUTROABS 7.3* 05/27/2013   HGB 13.3 05/27/2013   HCT 41.1 05/27/2013   MCV 89.3 05/27/2013   PLT 253 05/27/2013      Chemistry      Component Value Date/Time   NA 139 05/27/2013 0850   NA 141 04/04/2013 0732   K 4.5 05/27/2013 0850   K 3.9 04/04/2013 0732   CL 103 04/04/2013 0732   CO2 28 05/27/2013 0850   CO2 25 04/04/2013 0732   BUN 12.0 05/27/2013 0850   BUN 12 04/04/2013 0732   CREATININE 0.8 05/27/2013 0850   CREATININE 0.72 04/04/2013 0732      Component Value Date/Time   CALCIUM 9.8 05/27/2013 0850   CALCIUM 9.3 04/04/2013 0732   ALKPHOS 84 04/28/2013 1132   ALKPHOS 75 04/30/2010 0406   AST 69* 04/28/2013 1132   AST 31 04/30/2010 0406   ALT 41 04/28/2013 1132   ALT 30 04/30/2010 0406   BILITOT 0.37 04/28/2013 1132   BILITOT 0.9 04/30/2010 0406     ASSESSMENT & PLAN:  #1 tonsil cancer His tolerating treatment poorly. We'll continue treatment next week.  I will  dose his treatment intermittently depending on how much side effects. He may not get all the planned 7 treatment. 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 I recommend discontinuation of his diabetic medications due to recent weight loss #3 pressure sore around the feeding tube, improving Continue vigilant monitoring and local wound care. #4 mucositis with severe throat pain He has been prescribed Magic mouthwash. He was not tolerating extended release morphine well with difficulties with swallowing but doing well currently with fentanyl patch 75 mcg and to continue on liquid morphine as needed for breakthrough pain medicine. I will reassess his pain control next week. #5 malnutrition  He will continue nutritional supplement per his feeding tube. He follows closely with the nutritionist #6 acneform rash due to Erbitux This is due to his treatment. He started on clindamycin cream and topical hydrocortisone. I will hold off starting him on antibiotic treatment. #7 radiation-induced skin injury Continue topical emollient cream.   All questions were answered. The patient knows to call the clinic with any problems, questions or concerns. No barriers to learning was detected.    Heath Lark, MD 06/02/2013 10:18 AM

## 2013-06-02 NOTE — Telephone Encounter (Signed)
gv adn printed appt sched and avs for pt for May and June °

## 2013-06-02 NOTE — Progress Notes (Addendum)
To provide support, encouragement and care continuity, met with patient and his family during appt with Dr. Alvy Bimler.  I reinforced recommended procedure for washing neck during RT, application of Biafine cream.  Continuing navigation as L2 patient (treatments established).  Gayleen Orem, RN, BSN, [facility] Oncology Navigator [phone]

## 2013-06-03 ENCOUNTER — Ambulatory Visit
Admission: RE | Admit: 2013-06-03 | Discharge: 2013-06-03 | Disposition: A | Discharge status: Home / Self Care | Payer: 59 | Source: Ambulatory Visit | Attending: Radiation Oncology | Admitting: Radiation Oncology

## 2013-06-03 DIAGNOSIS — C099 Malignant neoplasm of tonsil, unspecified: Secondary | ICD-10-CM

## 2013-06-03 MED ORDER — BIAFINE EX EMUL
Freq: Two times a day (BID) | CUTANEOUS | Status: DC
  Administered 2013-06-03: 09:00:00 via TOPICAL

## 2013-06-07 ENCOUNTER — Telehealth: Payer: Self-pay | Admitting: Oncology

## 2013-06-07 ENCOUNTER — Ambulatory Visit (HOSPITAL_BASED_OUTPATIENT_CLINIC_OR_DEPARTMENT_OTHER): Payer: 59

## 2013-06-07 ENCOUNTER — Ambulatory Visit
Admission: RE | Admit: 2013-06-07 | Discharge: 2013-06-07 | Disposition: A | Discharge status: Home / Self Care | Payer: 59 | Source: Ambulatory Visit | Attending: Radiation Oncology | Admitting: Radiation Oncology

## 2013-06-07 VITALS — BP 105/67 | HR 87 | Temp 98.4°F | Resp 20

## 2013-06-07 DIAGNOSIS — C099 Malignant neoplasm of tonsil, unspecified: Secondary | ICD-10-CM

## 2013-06-07 DIAGNOSIS — Z5112 Encounter for antineoplastic immunotherapy: Secondary | ICD-10-CM

## 2013-06-07 MED ORDER — DIPHENHYDRAMINE HCL 50 MG/ML IJ SOLN
50.0000 mg | Freq: Once | INTRAMUSCULAR | Status: AC
  Administered 2013-06-07: 50 mg via INTRAVENOUS

## 2013-06-07 MED ORDER — SODIUM CHLORIDE 0.9 % IV SOLN
Freq: Once | INTRAVENOUS | Status: AC
  Administered 2013-06-07: 09:00:00 via INTRAVENOUS

## 2013-06-07 MED ORDER — SODIUM CHLORIDE 0.9 % IJ SOLN
10.0000 mL | INTRAMUSCULAR | Status: DC | PRN
  Administered 2013-06-07: 10 mL
  Filled 2013-06-07: qty 10

## 2013-06-07 MED ORDER — HEPARIN SOD (PORK) LOCK FLUSH 100 UNIT/ML IV SOLN
500.0000 [IU] | Freq: Once | INTRAVENOUS | Status: AC | PRN
  Administered 2013-06-07: 500 [IU]
  Filled 2013-06-07: qty 5

## 2013-06-07 MED ORDER — CETUXIMAB CHEMO IV INJECTION 200 MG/100ML
250.0000 mg/m2 | Freq: Once | INTRAVENOUS | Status: AC
  Administered 2013-06-07: 600 mg via INTRAVENOUS
  Filled 2013-06-07: qty 300

## 2013-06-07 MED ORDER — DIPHENHYDRAMINE HCL 50 MG/ML IJ SOLN
INTRAMUSCULAR | Status: AC
  Filled 2013-06-07: qty 1

## 2013-06-07 NOTE — Telephone Encounter (Signed)
[name] regarding a question she had asked [name] in [name] [name].  [name] was asking if [name] can lower [name]'s oxygen saturation.  She said that [name] had chemotherapy today and his oxygen saturation dropped to 87% and the nurses were having to wake him up to take deep breaths.  She said that she is an [name] and had remembered that [name] can cause oxygen levels to go down.  Advised her that [name] can suppress respiratory rate.  She said she is going to monitor his oxygen saturation at home tonight.  She has a pulse ox monitor that she can use.

## 2013-06-07 NOTE — Patient Instructions (Signed)
[place] Discharge Instructions for Patients Receiving Chemotherapy  Today you received the following chemotherapy agent: Erbitux   To help prevent nausea and vomiting after your treatment, we encourage you to take your nausea medication as prescribed.    If you develop nausea and vomiting that is not controlled by your nausea medication, call the clinic.   BELOW ARE SYMPTOMS THAT SHOULD BE REPORTED IMMEDIATELY:  *FEVER GREATER THAN 100.5 F  *CHILLS WITH OR WITHOUT FEVER  NAUSEA AND VOMITING THAT IS NOT CONTROLLED WITH YOUR NAUSEA MEDICATION  *UNUSUAL SHORTNESS OF BREATH  *UNUSUAL BRUISING OR BLEEDING  TENDERNESS IN MOUTH AND THROAT WITH OR WITHOUT PRESENCE OF ULCERS  *URINARY PROBLEMS  *BOWEL PROBLEMS  UNUSUAL RASH Items with * indicate a potential emergency and should be followed up as soon as possible.  Feel free to call the clinic you have any questions or concerns. The clinic phone number is [phone].

## 2013-06-07 NOTE — Progress Notes (Signed)
[name] reports glucose checks range 100- 130 at home; pt continues to hold metformin per Dr. Alvy Bimler.

## 2013-06-08 ENCOUNTER — Encounter: Payer: Self-pay | Admitting: Radiation Oncology

## 2013-06-08 ENCOUNTER — Ambulatory Visit
Admission: RE | Admit: 2013-06-08 | Discharge: 2013-06-08 | Disposition: A | Discharge status: Home / Self Care | Payer: 59 | Source: Ambulatory Visit | Attending: Radiation Oncology | Admitting: Radiation Oncology

## 2013-06-08 VITALS — BP 118/67 | HR 92 | Temp 98.2°F | Resp 20 | Wt 239.8 lb

## 2013-06-08 DIAGNOSIS — C099 Malignant neoplasm of tonsil, unspecified: Secondary | ICD-10-CM

## 2013-06-08 NOTE — Progress Notes (Signed)
   Weekly Management Note:  outpatient Current Dose:  50 Gy  Projected Dose: 70 Gy   Narrative:  The patient presents for routine under treatment assessment.  CBCT/MVCT images/Port film x-rays were reviewed.  The chart was checked. Per nursing phone note, pt had some respiratory depression after chemo infusion this week. But, wife says he breathed regularly and well at night last night.  Biafine is soothing skin in addition to clindagel.  He is using MMW as fentanyl, morphine for pain with good effect. Taking 8 cans daily via peg.  Sees Nutritionist again on June 8.  Wife and daughter are devoted to his care and understandably feel stressed. Wife amenable to seeing a counselor.  Physical Findings:  weight is 239 lb 12.8 oz (108.773 kg). His oral temperature is 98.2 F (36.8 C). His blood pressure is 118/67 and his pulse is 92. His respiration is 20 and oxygen saturation is 96%.  dry desquamation with patches of moistness over skin of neck. Tonsil tumor has regressed significantly.  Mucositis is confluent without blood or thrush in mouth.  CBC    Component Value Date/Time   WBC 7.6 06/02/2013 1017   WBC 8.8 04/04/2013 0732   RBC 4.33 06/02/2013 1017   RBC 4.89 04/04/2013 0732   HGB 12.4* 06/02/2013 1017   HGB 15.0 04/04/2013 0732   HCT 38.7 06/02/2013 1017   HCT 43.9 04/04/2013 0732   PLT 219 06/02/2013 1017   PLT 171 04/04/2013 0732   MCV 89.4 06/02/2013 1017   MCV 89.8 04/04/2013 0732   MCH 28.6 06/02/2013 1017   MCH 30.7 04/04/2013 0732   MCHC 32.0 06/02/2013 1017   MCHC 34.2 04/04/2013 0732   RDW 13.6 06/02/2013 1017   RDW 14.3 04/04/2013 0732   LYMPHSABS 0.3* 06/02/2013 1017   LYMPHSABS 1.4 10/17/2012 2222   MONOABS 0.6 06/02/2013 1017   MONOABS 0.5 10/17/2012 2222   EOSABS 0.3 06/02/2013 1017   EOSABS 0.3 10/17/2012 2222   BASOSABS 0.0 06/02/2013 1017   BASOSABS 0.0 10/17/2012 2222     CMP     Component Value Date/Time   NA 138 06/02/2013 1017   NA 141 04/04/2013 0732   K 4.5 06/02/2013 1017     K 3.9 04/04/2013 0732   CL 103 04/04/2013 0732   CO2 30* 06/02/2013 1017   CO2 25 04/04/2013 0732   GLUCOSE 165* 06/02/2013 1017   GLUCOSE 136* 04/04/2013 0732   BUN 10.8 06/02/2013 1017   BUN 12 04/04/2013 0732   CREATININE 0.7 06/02/2013 1017   CREATININE 0.72 04/04/2013 0732   CALCIUM 9.4 06/02/2013 1017   CALCIUM 9.3 04/04/2013 0732   PROT 8.4* 04/28/2013 1132   PROT 7.1 04/30/2010 0406   ALBUMIN 3.8 04/28/2013 1132   ALBUMIN 2.7* 04/30/2010 0406   AST 69* 04/28/2013 1132   AST 31 04/30/2010 0406   ALT 41 04/28/2013 1132   ALT 30 04/30/2010 0406   ALKPHOS 84 04/28/2013 1132   ALKPHOS 75 04/30/2010 0406   BILITOT 0.37 04/28/2013 1132   BILITOT 0.9 04/30/2010 0406   GFRNONAA >90 04/04/2013 0732   GFRAA >90 04/04/2013 0732     Impression:  The patient is tolerating radiotherapy.   Plan:  Continue radiotherapy as planned. Will refer his wife, [name] to social work for counseling services. I will defer to Polo Riley on the best counselor she sees fit. Glenard Haring, daughter, as well as Mr Weyer decline counseling for now.  -----------------------------------  Eppie Gibson, MD

## 2013-06-08 NOTE — Progress Notes (Signed)
Pt denies pain, difficulty swallowing, loss of appetite. He is eating small amounts soft foods, taking in 8 cans nutritional supplement per peg tube daily. Pt applying Biafine to skin of neck treatment area for dry to moist desquamation. He states his skin "doesn't hurt anymore".  Pt's tongue without evidence of thrush.

## 2013-06-09 ENCOUNTER — Ambulatory Visit
Admission: RE | Admit: 2013-06-09 | Discharge: 2013-06-09 | Disposition: A | Discharge status: Home / Self Care | Payer: 59 | Source: Ambulatory Visit | Attending: Radiation Oncology | Admitting: Radiation Oncology

## 2013-06-10 ENCOUNTER — Telehealth: Payer: Self-pay | Admitting: Hematology and Oncology

## 2013-06-10 ENCOUNTER — Ambulatory Visit
Admission: RE | Admit: 2013-06-10 | Discharge: 2013-06-10 | Disposition: A | Discharge status: Home / Self Care | Payer: 59 | Source: Ambulatory Visit | Attending: Radiation Oncology | Admitting: Radiation Oncology

## 2013-06-10 ENCOUNTER — Ambulatory Visit (HOSPITAL_BASED_OUTPATIENT_CLINIC_OR_DEPARTMENT_OTHER): Payer: 59 | Admitting: Hematology and Oncology

## 2013-06-10 ENCOUNTER — Ambulatory Visit (HOSPITAL_BASED_OUTPATIENT_CLINIC_OR_DEPARTMENT_OTHER): Payer: 59

## 2013-06-10 VITALS — BP 128/58 | HR 86 | Temp 97.3°F | Resp 18 | Ht 74.0 in | Wt 239.7 lb

## 2013-06-10 DIAGNOSIS — E119 Type 2 diabetes mellitus without complications: Secondary | ICD-10-CM

## 2013-06-10 DIAGNOSIS — C099 Malignant neoplasm of tonsil, unspecified: Secondary | ICD-10-CM

## 2013-06-10 DIAGNOSIS — R21 Rash and other nonspecific skin eruption: Secondary | ICD-10-CM | POA: Insufficient documentation

## 2013-06-10 DIAGNOSIS — E43 Unspecified severe protein-calorie malnutrition: Secondary | ICD-10-CM | POA: Insufficient documentation

## 2013-06-10 DIAGNOSIS — R070 Pain in throat: Secondary | ICD-10-CM

## 2013-06-10 DIAGNOSIS — K1231 Oral mucositis (ulcerative) due to antineoplastic therapy: Secondary | ICD-10-CM | POA: Insufficient documentation

## 2013-06-10 LAB — CBC WITH DIFFERENTIAL/PLATELET
BASO%: 0.7 % (ref 0.0–2.0)
BASOS ABS: 0.1 10*3/uL (ref 0.0–0.1)
EOS%: 4.3 % (ref 0.0–7.0)
Eosinophils Absolute: 0.4 10*3/uL (ref 0.0–0.5)
HEMATOCRIT: 39.2 % (ref 38.4–49.9)
HEMOGLOBIN: 12.9 g/dL — AB (ref 13.0–17.1)
LYMPH%: 3 % — AB (ref 14.0–49.0)
MCH: 28.6 pg (ref 27.2–33.4)
MCHC: 32.8 g/dL (ref 32.0–36.0)
MCV: 87.2 fL (ref 79.3–98.0)
MONO#: 0.7 10*3/uL (ref 0.1–0.9)
MONO%: 8.3 % (ref 0.0–14.0)
NEUT#: 7.4 10*3/uL — ABNORMAL HIGH (ref 1.5–6.5)
NEUT%: 83.7 % — ABNORMAL HIGH (ref 39.0–75.0)
PLATELETS: 215 10*3/uL (ref 140–400)
RBC: 4.5 10*6/uL (ref 4.20–5.82)
RDW: 13.8 % (ref 11.0–14.6)
WBC: 8.9 10*3/uL (ref 4.0–10.3)
lymph#: 0.3 10*3/uL — ABNORMAL LOW (ref 0.9–3.3)

## 2013-06-10 LAB — BASIC METABOLIC PANEL (CC13)
Anion Gap: 11 mEq/L (ref 3–11)
BUN: 9.4 mg/dL (ref 7.0–26.0)
CO2: 28 meq/L (ref 22–29)
CREATININE: 0.7 mg/dL (ref 0.7–1.3)
Calcium: 9.6 mg/dL (ref 8.4–10.4)
Chloride: 100 mEq/L (ref 98–109)
GLUCOSE: 150 mg/dL — AB (ref 70–140)
Potassium: 4.5 mEq/L (ref 3.5–5.1)
Sodium: 139 mEq/L (ref 136–145)

## 2013-06-10 LAB — MAGNESIUM (CC13): Magnesium: 2.2 mg/dl (ref 1.5–2.5)

## 2013-06-10 MED ORDER — MORPHINE SULFATE (CONCENTRATE) 20 MG/ML PO SOLN: 10.0000 mg | ORAL | Status: DC | PRN

## 2013-06-10 MED ORDER — FENTANYL 75 MCG/HR TD PT72: 75.0000 ug | MEDICATED_PATCH | TRANSDERMAL | Status: DC

## 2013-06-10 NOTE — Assessment & Plan Note (Signed)
He is currently taking fentanyl patch along with liquid morphine as needed. His pain is currently well-controlled.

## 2013-06-10 NOTE — Assessment & Plan Note (Signed)
He will continue topical ointment and antibiotic cream as prescribed.

## 2013-06-10 NOTE — Assessment & Plan Note (Signed)
His blood sugar is usually under 150 overall last week. Due to profound weight loss, I recommend we continue to hold his diabetes medications for now.

## 2013-06-10 NOTE — Assessment & Plan Note (Signed)
He is improving with aggressive management of symptoms. I will continue to proceed with treatment next week without dose adjustment.

## 2013-06-10 NOTE — Telephone Encounter (Signed)
gv adn pritned appt sched and avs for opt fro May adn June.Joseph KitchenMarland KitchenMarland [name]

## 2013-06-10 NOTE — Progress Notes (Signed)
[place] OFFICE PROGRESS NOTE  Patient Care Team: Halford Chessman, MD as PCP - General (Family Medicine) Brooks Sailors, RN as Registered Nurse (Oncology) Heath Lark, MD as Consulting Physician (Hematology and Oncology) Eppie Gibson, MD as Attending Physician (Radiation Oncology)  SUMMARY OF ONCOLOGIC HISTORY: Oncology History   Tonsil cancer, HPV positive   Primary site: Pharynx - Oropharynx (Right)   Staging method: AJCC 7th Edition   Clinical free text: HPV positive   Clinical: Stage IVA (T3, N2c, M0) signed by Heath Lark, MD on 04/20/2013  9:29 PM   Summary: Stage IVA (T3, N2c, M0)       Tonsil cancer   02/01/2013 Imaging Ultrasound of the neck revealed bilateral lymphadenopathy in the submandibular region   03/16/2013 Imaging CT scan of the neck show large right tonsil mass measured 3.9 cm in maximum dimension as well as bilateral lymphadenopathy, the largest lymph node measures 31 mm. There is also a left thyroid mass measured 44 mm   03/21/2013 Procedure The patient was seen by ENT with laryngoscopy and biopsy. Pathology is pending   03/25/2013 Imaging PET scan showed large hypermetabolic soft tissue mass in the region of the right palatine tonsil with bilateral cervical hypermetabolic lymphadenopathy indicative of metastatic disease,   04/04/2013 Surgery He underwent placement of Port-A-Cath and feeding tube   04/14/2013 Procedure The patient underwent teeth extraction.    INTERVAL HISTORY: Please see below for problem oriented charting. Overall, he is feeling fine. His pain is under well-controlled. He continued to lose weight. He has persistent skin rash on his face as well as around the neck from treatment related complications.  REVIEW OF SYSTEMS:   Constitutional: Denies fevers, chills  Eyes: Denies blurriness of vision Respiratory: Denies cough, dyspnea or wheezes Cardiovascular: Denies palpitation, chest discomfort or lower extremity  swelling Lymphatics: Denies new lymphadenopathy or easy bruising Neurological:Denies numbness, tingling or new weaknesses Behavioral/Psych: Mood is stable, no new changes  All other systems were reviewed with the patient and are negative.  I have reviewed the past medical history, past surgical history, social history and family history with the patient and they are unchanged from previous note.  ALLERGIES:  is allergic to neurontin and dilaudid.  MEDICATIONS:  Current Outpatient Prescriptions  Medication Sig Dispense Refill  . acetaminophen (TYLENOL) 500 MG tablet Take 500-1,000 mg by mouth every 4 (four) hours as needed for mild pain.       [name] alprazolam (XANAX) 2 MG tablet Take 2 mg by mouth 3 (three) times daily as needed for sleep or anxiety.       . Alum & Mag Hydroxide-Simeth (MAGIC MOUTHWASH W/LIDOCAINE) SOLN 1part nystatin,1part Maaloxplus,1part benadryl,3part 2%viscous lidocaine. Swish/swallow 10 mL up to QID, 88min before meals/bedtime  480 mL  5  . Cholecalciferol (VITAMIN D3) 5000 UNITS CAPS Take 5,000 Units by mouth daily.      . citalopram (CELEXA) 20 MG tablet Take 20 mg by mouth daily.      . clindamycin (CLINDAGEL) 1 % gel Apply topically 2 (two) times daily. To affected area.  60 g  0  . diphenhydrAMINE (BENADRYL) 25 mg capsule Take 1 capsule (25 mg total) by mouth every 6 (six) hours as needed for itching.  60 capsule  0  . docusate sodium (COLACE) 100 MG capsule Take 100 mg by mouth 2 (two) times daily as needed for mild constipation.      [name] emollient (BIAFINE) cream Apply topically 2 (two) times daily. Apply.t after  radiation treatent and bedtime.      . fentaNYL (DURAGESIC - DOSED MCG/HR) 75 MCG/HR Place 1 patch (75 mcg total) onto the skin every 3 (three) days.  5 patch  0  . hydrocortisone cream 1 % Apply 1 application topically 2 (two) times daily.  30 g  0  . lidocaine-prilocaine (EMLA) cream Apply 1 application topically as needed.  30 g  0  . morphine (ROXANOL) 20  MG/ML concentrated solution Take 0.5 mLs (10 mg total) by mouth every 2 (two) hours as needed for severe pain.  240 mL  0  . ondansetron (ZOFRAN) 8 MG tablet Take 1 tablet (8 mg total) by mouth every 8 (eight) hours as needed for nausea.  60 tablet  3  . pregabalin (LYRICA) 150 MG capsule Take 300 mg by mouth 2 (two) times daily.       . promethazine (PHENERGAN) 25 MG tablet Take 1 tablet (25 mg total) by mouth every 6 (six) hours as needed for nausea.  60 tablet  3  . sucralfate (CARAFATE) 1 G tablet dissolve 1 tablet in 10 mL H20 and swallow up to four times a day to soothe throat.  60 tablet  5   No current facility-administered medications for this visit.    PHYSICAL EXAMINATION: ECOG PERFORMANCE STATUS: 2 - Symptomatic, <50% confined to bed  Filed Vitals:   06/10/13 0807  BP: 128/58  Pulse: 86  Temp: 97.3 F (36.3 C)  Resp: 18   Filed Weights   06/10/13 0807  Weight: 239 lb 11.2 oz (108.727 kg)    GENERAL:alert, no distress and comfortable SKIN: The Erbitux induced skin rash is stable. The skin injury around his neck from radiation is getting worse with some mild ulceration. EYES: normal, Conjunctiva are pink and non-injected, sclera clear OROPHARYNX: significant mucositis in his mouth. No thrush.  NECK: supple, thyroid normal size, non-tender, without nodularity LYMPH:  Previously palpable lymphadenopathy has regressed in size. LUNGS: clear to auscultation and percussion with normal breathing effort HEART: regular rate & rhythm and no murmurs and no lower extremity edema ABDOMEN:abdomen soft, non-tender and normal bowel sounds. Feeding tube site looks okay. Musculoskeletal:no cyanosis of digits and no clubbing  NEURO: alert & oriented x 3 with fluent speech, no focal motor/sensory deficits  LABORATORY DATA:  I have reviewed the data as listed    Component Value Date/Time   NA 138 06/02/2013 1017   NA 141 04/04/2013 0732   K 4.5 06/02/2013 1017   K 3.9 04/04/2013 0732    CL 103 04/04/2013 0732   CO2 30* 06/02/2013 1017   CO2 25 04/04/2013 0732   GLUCOSE 165* 06/02/2013 1017   GLUCOSE 136* 04/04/2013 0732   BUN 10.8 06/02/2013 1017   BUN 12 04/04/2013 0732   CREATININE 0.7 06/02/2013 1017   CREATININE 0.72 04/04/2013 0732   CALCIUM 9.4 06/02/2013 1017   CALCIUM 9.3 04/04/2013 0732   PROT 8.4* 04/28/2013 1132   PROT 7.1 04/30/2010 0406   ALBUMIN 3.8 04/28/2013 1132   ALBUMIN 2.7* 04/30/2010 0406   AST 69* 04/28/2013 1132   AST 31 04/30/2010 0406   ALT 41 04/28/2013 1132   ALT 30 04/30/2010 0406   ALKPHOS 84 04/28/2013 1132   ALKPHOS 75 04/30/2010 0406   BILITOT 0.37 04/28/2013 1132   BILITOT 0.9 04/30/2010 0406   GFRNONAA >90 04/04/2013 0732   GFRAA >90 04/04/2013 0732    No results found for this basename: SPEP, UPEP,  kappa and lambda light  chains    Lab Results  Component Value Date   WBC 8.9 06/10/2013   NEUTROABS 7.4* 06/10/2013   HGB 12.9* 06/10/2013   HCT 39.2 06/10/2013   MCV 87.2 06/10/2013   PLT 215 06/10/2013      Chemistry      Component Value Date/Time   NA 138 06/02/2013 1017   NA 141 04/04/2013 0732   K 4.5 06/02/2013 1017   K 3.9 04/04/2013 0732   CL 103 04/04/2013 0732   CO2 30* 06/02/2013 1017   CO2 25 04/04/2013 0732   BUN 10.8 06/02/2013 1017   BUN 12 04/04/2013 0732   CREATININE 0.7 06/02/2013 1017   CREATININE 0.72 04/04/2013 0732      Component Value Date/Time   CALCIUM 9.4 06/02/2013 1017   CALCIUM 9.3 04/04/2013 0732   ALKPHOS 84 04/28/2013 1132   ALKPHOS 75 04/30/2010 0406   AST 69* 04/28/2013 1132   AST 31 04/30/2010 0406   ALT 41 04/28/2013 1132   ALT 30 04/30/2010 0406   BILITOT 0.37 04/28/2013 1132   BILITOT 0.9 04/30/2010 0406     ASSESSMENT & PLAN:  Tonsil cancer He is improving with aggressive management of symptoms. I will continue to proceed with treatment next week without dose adjustment.  Diabetes mellitus, type 2 His blood sugar is usually under 150 overall last week. Due to profound weight loss, I recommend we continue to  hold his diabetes medications for now.  Mucositis (ulcerative) due to antineoplastic therapy He is currently taking fentanyl patch along with liquid morphine as needed. His pain is currently well-controlled.  Protein-calorie malnutrition, severe He still had persistent weight loss but overall he is able to tolerate 8 cans of nutritional supplements. He will continue the same and will continue followup with dietitian.  Rash He will continue topical ointment and antibiotic cream as prescribed.  All questions were answered. The patient knows to call the clinic with any problems, questions or concerns. No barriers to learning was detected.   Heath Lark, MD 06/10/2013 9:08 AM

## 2013-06-10 NOTE — Assessment & Plan Note (Signed)
He still had persistent weight loss but overall he is able to tolerate 8 cans of nutritional supplements. He will continue the same and will continue followup with dietitian.

## 2013-06-13 ENCOUNTER — Ambulatory Visit: Payer: 59 | Attending: Radiation Oncology

## 2013-06-13 ENCOUNTER — Ambulatory Visit: Payer: 59

## 2013-06-13 ENCOUNTER — Ambulatory Visit
Admission: RE | Admit: 2013-06-13 | Discharge: 2013-06-13 | Disposition: A | Discharge status: Home / Self Care | Payer: 59 | Source: Ambulatory Visit | Attending: Radiation Oncology | Admitting: Radiation Oncology

## 2013-06-13 ENCOUNTER — Telehealth: Payer: Self-pay | Admitting: *Deleted

## 2013-06-13 ENCOUNTER — Encounter: Payer: Self-pay | Admitting: Radiation Oncology

## 2013-06-13 ENCOUNTER — Ambulatory Visit (HOSPITAL_BASED_OUTPATIENT_CLINIC_OR_DEPARTMENT_OTHER): Payer: 59

## 2013-06-13 ENCOUNTER — Ambulatory Visit: Admission: RE | Admit: 2013-06-13 | Payer: 59 | Source: Ambulatory Visit | Admitting: Radiation Oncology

## 2013-06-13 VITALS — BP 110/73 | HR 96 | Temp 98.4°F | Resp 17

## 2013-06-13 VITALS — BP 121/81 | HR 97 | Temp 98.8°F | Resp 16 | Wt 238.3 lb

## 2013-06-13 DIAGNOSIS — C099 Malignant neoplasm of tonsil, unspecified: Secondary | ICD-10-CM | POA: Insufficient documentation

## 2013-06-13 DIAGNOSIS — G589 Mononeuropathy, unspecified: Secondary | ICD-10-CM | POA: Insufficient documentation

## 2013-06-13 DIAGNOSIS — Z5112 Encounter for antineoplastic immunotherapy: Secondary | ICD-10-CM

## 2013-06-13 DIAGNOSIS — K1231 Oral mucositis (ulcerative) due to antineoplastic therapy: Secondary | ICD-10-CM

## 2013-06-13 MED ORDER — DIPHENHYDRAMINE HCL 50 MG/ML IJ SOLN
50.0000 mg | Freq: Once | INTRAMUSCULAR | Status: AC
  Administered 2013-06-13: 50 mg via INTRAVENOUS

## 2013-06-13 MED ORDER — CETUXIMAB CHEMO IV INJECTION 200 MG/100ML
250.0000 mg/m2 | Freq: Once | INTRAVENOUS | Status: AC
  Administered 2013-06-13: 600 mg via INTRAVENOUS
  Filled 2013-06-13: qty 300

## 2013-06-13 MED ORDER — HEPARIN SOD (PORK) LOCK FLUSH 100 UNIT/ML IV SOLN
500.0000 [IU] | Freq: Once | INTRAVENOUS | Status: AC | PRN
  Administered 2013-06-13: 500 [IU]
  Filled 2013-06-13: qty 5

## 2013-06-13 MED ORDER — SODIUM CHLORIDE 0.9 % IJ SOLN
10.0000 mL | INTRAMUSCULAR | Status: DC | PRN
  Administered 2013-06-13: 10 mL
  Filled 2013-06-13: qty 10

## 2013-06-13 MED ORDER — DIPHENHYDRAMINE HCL 50 MG/ML IJ SOLN
INTRAMUSCULAR | Status: AC
  Filled 2013-06-13: qty 1

## 2013-06-13 MED ORDER — BIAFINE EX EMUL
Freq: Every day | CUTANEOUS | Status: DC
  Administered 2013-06-13: 17:00:00 via TOPICAL

## 2013-06-13 MED ORDER — SODIUM CHLORIDE 0.9 % IV SOLN
Freq: Once | INTRAVENOUS | Status: AC
  Administered 2013-06-13: 09:00:00 via INTRAVENOUS

## 2013-06-13 NOTE — Progress Notes (Signed)
To provide support, encouragement and care continuity, met with patient during daily RT and afterwards during weekly UT with Dr. Isidore Moos.  Obtained a tube of Biafine for him, he verbalized understanding of its use as well as proper procedure for daily washing of his neck.  He has maintained his weight from last week by following nutritional guidance provided by nutritionist.  Subjectively, patient very positive and upbeat.  Continuing navigation as L2 patient (treatments established).  Gayleen Orem, RN, BSN, [facility] Navigator [phone]

## 2013-06-13 NOTE — Progress Notes (Addendum)
Concerned about skin irritation at porta cath. Hyperpigmentation with moist desquamation anterior neck. Reports using biafine as directed. Questions if SSD would help desquamation. Denies pain so long as he has the fentanyl and morphine OTC as scheduled. Reports eating soft foods and 2 cans of osmolite qid plus Ensure in between. Reports occasional dry mouth relieved with biotene. Reports thick rope like saliva. Reports drinking water by mouth and via tube. Reports he remain active walking his driveway at home to fight fatigue. Weight stable.

## 2013-06-13 NOTE — Progress Notes (Signed)
   Weekly Management Note:  outpatient Current Dose:  56 Gy  Projected Dose: 70 Gy   Narrative:  The patient presents for routine under treatment assessment.  CBCT/MVCT images/Port film x-rays were reviewed.  The chart was checked. Hyperpigmentation with limited moist desquamation anterior neck. Reports using biafine as directed. Denies pain so long as he has the fentanyl and morphine OTC as scheduled. Reports eating soft foods and 2 cans of osmolite qid plus Ensure in between. Reports occasional dry mouth relieved with biotene. Reports thick rope like saliva. Reports drinking water by mouth and via tube. Reports he remain active walking his driveway at home to fight fatigue.  Weight is stable.  Physical Findings:  weight is 238 lb 4.8 oz (108.092 kg). His oral temperature is 98.8 F (37.1 C). His blood pressure is 121/81 and his pulse is 97. His respiration is 16 and oxygen saturation is 95%.  mucositis in oropharynx, no thrush. Ropey Saliva. Skin over neck - Erbitux/RT rash - mostly dry desquamation, limited moistness.  Peg site - dry desquamation. CBC    Component Value Date/Time   WBC 8.9 06/10/2013 0843   WBC 8.8 04/04/2013 0732   RBC 4.50 06/10/2013 0843   RBC 4.89 04/04/2013 0732   HGB 12.9* 06/10/2013 0843   HGB 15.0 04/04/2013 0732   HCT 39.2 06/10/2013 0843   HCT 43.9 04/04/2013 0732   PLT 215 06/10/2013 0843   PLT 171 04/04/2013 0732   MCV 87.2 06/10/2013 0843   MCV 89.8 04/04/2013 0732   MCH 28.6 06/10/2013 0843   MCH 30.7 04/04/2013 0732   MCHC 32.8 06/10/2013 0843   MCHC 34.2 04/04/2013 0732   RDW 13.8 06/10/2013 0843   RDW 14.3 04/04/2013 0732   LYMPHSABS 0.3* 06/10/2013 0843   LYMPHSABS 1.4 10/17/2012 2222   MONOABS 0.7 06/10/2013 0843   MONOABS 0.5 10/17/2012 2222   EOSABS 0.4 06/10/2013 0843   EOSABS 0.3 10/17/2012 2222   BASOSABS 0.1 06/10/2013 0843   BASOSABS 0.0 10/17/2012 2222     CMP     Component Value Date/Time   NA 139 06/10/2013 0843   NA 141 04/04/2013 0732   K 4.5  06/10/2013 0843   K 3.9 04/04/2013 0732   CL 103 04/04/2013 0732   CO2 28 06/10/2013 0843   CO2 25 04/04/2013 0732   GLUCOSE 150* 06/10/2013 0843   GLUCOSE 136* 04/04/2013 0732   BUN 9.4 06/10/2013 0843   BUN 12 04/04/2013 0732   CREATININE 0.7 06/10/2013 0843   CREATININE 0.72 04/04/2013 0732   CALCIUM 9.6 06/10/2013 0843   CALCIUM 9.3 04/04/2013 0732   PROT 8.4* 04/28/2013 1132   PROT 7.1 04/30/2010 0406   ALBUMIN 3.8 04/28/2013 1132   ALBUMIN 2.7* 04/30/2010 0406   AST 69* 04/28/2013 1132   AST 31 04/30/2010 0406   ALT 41 04/28/2013 1132   ALT 30 04/30/2010 0406   ALKPHOS 84 04/28/2013 1132   ALKPHOS 75 04/30/2010 0406   BILITOT 0.37 04/28/2013 1132   BILITOT 0.9 04/30/2010 0406   GFRNONAA >90 04/04/2013 0732   GFRAA >90 04/04/2013 0732     Impression:  The patient is tolerating radiotherapy.   Plan:  Continue radiotherapy as planned. Consider silvadene only if moist desquamation worsens significanttly. Continue current topical agents for now.   -----------------------------------  Eppie Gibson, MD

## 2013-06-13 NOTE — Progress Notes (Signed)
Patient observed 1 hr post erbitux infusion, tolerated well.

## 2013-06-13 NOTE — Telephone Encounter (Signed)
Called patient home and cell, wife stated he's  Having chemo now at the cancer center, thanked wife, called infusion and spoke with Jabier Gauss, Charge RN, patient chemo is [phone], she will send patient down to nursing when completed, and she will convey this message to Mr. Maddocks, thanked Shirlean Mylar Will inform MD 10:53 AM

## 2013-06-13 NOTE — Patient Instructions (Signed)
[facility] Discharge Instructions for Patients Receiving Chemotherapy  Today you received the following chemotherapy agents: Erbitux  To help prevent nausea and vomiting after your treatment, we encourage you to take your nausea medication: Phenergan 25 mg every 6 hrs as needed.    If you develop nausea and vomiting that is not controlled by your nausea medication, call the clinic.   BELOW ARE SYMPTOMS THAT SHOULD BE REPORTED IMMEDIATELY:  *FEVER GREATER THAN 100.5 F  *CHILLS WITH OR WITHOUT FEVER  NAUSEA AND VOMITING THAT IS NOT CONTROLLED WITH YOUR NAUSEA MEDICATION  *UNUSUAL SHORTNESS OF BREATH  *UNUSUAL BRUISING OR BLEEDING  TENDERNESS IN MOUTH AND THROAT WITH OR WITHOUT PRESENCE OF ULCERS  *URINARY PROBLEMS  *BOWEL PROBLEMS  UNUSUAL RASH Items with * indicate a potential emergency and should be followed up as soon as possible.  Feel free to call the clinic you have any questions or concerns. The clinic phone number is [phone].

## 2013-06-14 ENCOUNTER — Ambulatory Visit
Admission: RE | Admit: 2013-06-14 | Discharge: 2013-06-14 | Disposition: A | Discharge status: Home / Self Care | Payer: 59 | Source: Ambulatory Visit | Attending: Radiation Oncology | Admitting: Radiation Oncology

## 2013-06-14 ENCOUNTER — Encounter: Payer: Self-pay | Admitting: *Deleted

## 2013-06-14 NOTE — Progress Notes (Signed)
[place] Work  Clinical Social Work was referred by P[employer] scheme manager for referral to counseling for patient's spouse.  Clinical Social Worker attempted to contact patient's spouse to offer support and assess for needs.  CSW unable to reach patient or spouse.  CSW left voicemail to return call when convenient.   Polo Riley, MSW, LCSW, OSW-C Clinical Social Worker [facility] [phone]

## 2013-06-15 ENCOUNTER — Ambulatory Visit
Admission: RE | Admit: 2013-06-15 | Discharge: 2013-06-15 | Disposition: A | Discharge status: Home / Self Care | Payer: 59 | Source: Ambulatory Visit | Attending: Radiation Oncology | Admitting: Radiation Oncology

## 2013-06-16 ENCOUNTER — Ambulatory Visit
Admission: RE | Admit: 2013-06-16 | Discharge: 2013-06-16 | Disposition: A | Discharge status: Home / Self Care | Payer: 59 | Source: Ambulatory Visit | Attending: Radiation Oncology | Admitting: Radiation Oncology

## 2013-06-17 ENCOUNTER — Telehealth: Payer: Self-pay | Admitting: Hematology and Oncology

## 2013-06-17 ENCOUNTER — Ambulatory Visit (HOSPITAL_BASED_OUTPATIENT_CLINIC_OR_DEPARTMENT_OTHER): Payer: 59 | Admitting: Hematology and Oncology

## 2013-06-17 ENCOUNTER — Encounter: Payer: Self-pay | Admitting: Hematology and Oncology

## 2013-06-17 ENCOUNTER — Ambulatory Visit
Admission: RE | Admit: 2013-06-17 | Discharge: 2013-06-17 | Disposition: A | Discharge status: Home / Self Care | Payer: 59 | Source: Ambulatory Visit | Attending: Radiation Oncology | Admitting: Radiation Oncology

## 2013-06-17 VITALS — BP 122/75 | HR 93 | Temp 97.4°F | Resp 20 | Ht 74.0 in | Wt 239.7 lb

## 2013-06-17 DIAGNOSIS — R070 Pain in throat: Secondary | ICD-10-CM

## 2013-06-17 DIAGNOSIS — E43 Unspecified severe protein-calorie malnutrition: Secondary | ICD-10-CM

## 2013-06-17 DIAGNOSIS — K1231 Oral mucositis (ulcerative) due to antineoplastic therapy: Secondary | ICD-10-CM

## 2013-06-17 DIAGNOSIS — C099 Malignant neoplasm of tonsil, unspecified: Secondary | ICD-10-CM

## 2013-06-17 DIAGNOSIS — R21 Rash and other nonspecific skin eruption: Secondary | ICD-10-CM

## 2013-06-17 DIAGNOSIS — E119 Type 2 diabetes mellitus without complications: Secondary | ICD-10-CM

## 2013-06-17 MED ORDER — FENTANYL 75 MCG/HR TD PT72: 75.0000 ug | MEDICATED_PATCH | TRANSDERMAL | Status: DC

## 2013-06-17 MED ORDER — CLINDAMYCIN PHOSPHATE 1 % EX GEL: Freq: Two times a day (BID) | CUTANEOUS | Status: DC

## 2013-06-17 MED ORDER — MORPHINE SULFATE (CONCENTRATE) 20 MG/ML PO SOLN: 10.0000 mg | ORAL | Status: DC | PRN

## 2013-06-17 NOTE — Assessment & Plan Note (Signed)
He tolerated treatment poorly. He has extensive skin ulceration on his neck with associated rash from Erbitux. I recommend we discontinue treatment now rather than to give him his last dose of Erbitux next week. I will continue supportive care.

## 2013-06-17 NOTE — Telephone Encounter (Signed)
gv adn printed appt sched and avs for pt for June °

## 2013-06-17 NOTE — Assessment & Plan Note (Signed)
His skin now has become ulcerated. There are two components of skin toxicity, one is related to chemotherapy and the other is due to radiation. I refilled prescription clindamycin gel. I would discontinue Erbitux due to this. The patient will continue radiation therapy until next week.

## 2013-06-17 NOTE — Assessment & Plan Note (Signed)
His blood sugars remain under control. Continue close monitoring of his blood sugar.

## 2013-06-17 NOTE — Assessment & Plan Note (Signed)
This is getting significantly worse. I refill his pain prescription today. Continue conservative management with pain medicine as needed. Hopefully, with discontinuation of chemotherapy, this will start to heal by next week.

## 2013-06-17 NOTE — Assessment & Plan Note (Signed)
Thankfully, he is able to tolerate a cans of nutritional supplement and has not lost any weight.

## 2013-06-17 NOTE — Telephone Encounter (Signed)
gv adn rpinted appt sched and avs fo ropt for June

## 2013-06-17 NOTE — Progress Notes (Signed)
[name] OFFICE PROGRESS NOTE  Patient Care Team: Halford Chessman, MD as PCP - General (Family Medicine) Brooks Sailors, RN as Registered Nurse (Oncology) Heath Lark, MD as Consulting Physician (Hematology and Oncology) Eppie Gibson, MD as Attending Physician (Radiation Oncology)  SUMMARY OF ONCOLOGIC HISTORY: Oncology History   Tonsil cancer, HPV positive   Primary site: Pharynx - Oropharynx (Right)   Staging method: AJCC 7th Edition   Clinical free text: HPV positive   Clinical: Stage IVA (T3, N2c, M0) signed by Heath Lark, MD on 04/20/2013  9:29 PM   Summary: Stage IVA (T3, N2c, M0)       Tonsil cancer   02/01/2013 Imaging Ultrasound of the neck revealed bilateral lymphadenopathy in the submandibular region   03/16/2013 Imaging CT scan of the neck show large right tonsil mass measured 3.9 cm in maximum dimension as well as bilateral lymphadenopathy, the largest lymph node measures 31 mm. There is also a left thyroid mass measured 44 mm   03/21/2013 Procedure The patient was seen by ENT with laryngoscopy and biopsy. Pathology is pending   03/25/2013 Imaging PET scan showed large hypermetabolic soft tissue mass in the region of the right palatine tonsil with bilateral cervical hypermetabolic lymphadenopathy indicative of metastatic disease,   04/04/2013 Surgery He underwent placement of Port-A-Cath and feeding tube   04/14/2013 Procedure The patient underwent teeth extraction.   05/02/2013 - 06/13/2013 Chemotherapy The dosage of chemotherapy was interrupted many times due to side effects. His treatment is terminated early due to severe ulcerated skin toxicity.   05/02/2013 -  Radiation Therapy The patient is started on radiation treatment.    INTERVAL HISTORY: Please see below for problem oriented charting. He has worsening skin ulceration around his neck. He has not lost any weight. His pain appeared to be under control. Denies constipation.  REVIEW OF SYSTEMS:    Constitutional: Denies fevers, chills or abnormal weight loss Eyes: Denies blurriness of vision Respiratory: Denies cough, dyspnea or wheezes Cardiovascular: Denies palpitation, chest discomfort or lower extremity swelling Gastrointestinal:  Denies nausea, heartburn or change in bowel habits Lymphatics: Denies new lymphadenopathy or easy bruising Neurological:Denies numbness, tingling or new weaknesses Behavioral/Psych: Mood is stable, no new changes  All other systems were reviewed with the patient and are negative.  I have reviewed the past medical history, past surgical history, social history and family history with the patient and they are unchanged from previous note.  ALLERGIES:  is allergic to neurontin and dilaudid.  MEDICATIONS:  Current Outpatient Prescriptions  Medication Sig Dispense Refill  . acetaminophen (TYLENOL) 500 MG tablet Take 500-1,000 mg by mouth every 4 (four) hours as needed for mild pain.       [name] alprazolam (XANAX) 2 MG tablet Take 2 mg by mouth 3 (three) times daily as needed for sleep or anxiety.       . Alum & Mag Hydroxide-Simeth (MAGIC MOUTHWASH W/LIDOCAINE) SOLN 1part nystatin,1part Maaloxplus,1part benadryl,3part 2%viscous lidocaine. Swish/swallow 10 mL up to QID, 51min before meals/bedtime  480 mL  5  . Cholecalciferol (VITAMIN D3) 5000 UNITS CAPS Take 5,000 Units by mouth daily.      . citalopram (CELEXA) 20 MG tablet Take 20 mg by mouth daily.      . clindamycin (CLINDAGEL) 1 % gel Apply topically 2 (two) times daily. To affected area.  60 g  1  . diphenhydrAMINE (BENADRYL) 25 mg capsule Take 1 capsule (25 mg total) by mouth every 6 (six) hours as needed  for itching.  60 capsule  0  . docusate sodium (COLACE) 100 MG capsule Take 100 mg by mouth 2 (two) times daily as needed for mild constipation.      [name] emollient (BIAFINE) cream Apply topically 2 (two) times daily. Apply.t after radiation treatent and bedtime.      . fentaNYL (DURAGESIC - DOSED MCG/HR)  75 MCG/HR Place 1 patch (75 mcg total) onto the skin every 3 (three) days.  5 patch  0  . hydrocortisone cream 1 % Apply 1 application topically 2 (two) times daily.  30 g  0  . lidocaine-prilocaine (EMLA) cream Apply 1 application topically as needed.  30 g  0  . morphine (ROXANOL) 20 MG/ML concentrated solution Take 0.5 mLs (10 mg total) by mouth every 2 (two) hours as needed for severe pain.  240 mL  0  . ondansetron (ZOFRAN) 8 MG tablet Take 1 tablet (8 mg total) by mouth every 8 (eight) hours as needed for nausea.  60 tablet  3  . promethazine (PHENERGAN) 25 MG tablet Take 1 tablet (25 mg total) by mouth every 6 (six) hours as needed for nausea.  60 tablet  3  . sucralfate (CARAFATE) 1 G tablet dissolve 1 tablet in 10 mL H20 and swallow up to four times a day to soothe throat.  60 tablet  5  . pregabalin (LYRICA) 150 MG capsule Take 300 mg by mouth 2 (two) times daily.        No current facility-administered medications for this visit.    PHYSICAL EXAMINATION: ECOG PERFORMANCE STATUS: 1 - Symptomatic but completely ambulatory  Filed Vitals:   06/17/13 0945  BP: 122/75  Pulse: 93  Temp: 97.4 F (36.3 C)  Resp: 20   Filed Weights   06/17/13 0945  Weight: 239 lb 11.2 oz (108.727 kg)    GENERAL:alert, no distress and comfortable SKIN: Significant skin ulceration around his neck. This is worse compared to last week.  EYES: normal, Conjunctiva are pink and non-injected, sclera clear OROPHARYNX: Significant ulceration in his mouth. No thrush. NECK: Unable to examine his neck due to severe skin ulceration. LUNGS: clear to auscultation and percussion with normal breathing effort HEART: regular rate & rhythm and no murmurs and no lower extremity edema ABDOMEN:abdomen soft, non-tender and normal bowel sounds Musculoskeletal:no cyanosis of digits and no clubbing  NEURO: alert & oriented x 3 with fluent speech, no focal motor/sensory deficits  LABORATORY DATA:  I have reviewed the  data as listed    Component Value Date/Time   NA 139 06/10/2013 0843   NA 141 04/04/2013 0732   K 4.5 06/10/2013 0843   K 3.9 04/04/2013 0732   CL 103 04/04/2013 0732   CO2 28 06/10/2013 0843   CO2 25 04/04/2013 0732   GLUCOSE 150* 06/10/2013 0843   GLUCOSE 136* 04/04/2013 0732   BUN 9.4 06/10/2013 0843   BUN 12 04/04/2013 0732   CREATININE 0.7 06/10/2013 0843   CREATININE 0.72 04/04/2013 0732   CALCIUM 9.6 06/10/2013 0843   CALCIUM 9.3 04/04/2013 0732   PROT 8.4* 04/28/2013 1132   PROT 7.1 04/30/2010 0406   ALBUMIN 3.8 04/28/2013 1132   ALBUMIN 2.7* 04/30/2010 0406   AST 69* 04/28/2013 1132   AST 31 04/30/2010 0406   ALT 41 04/28/2013 1132   ALT 30 04/30/2010 0406   ALKPHOS 84 04/28/2013 1132   ALKPHOS 75 04/30/2010 0406   BILITOT 0.37 04/28/2013 1132   BILITOT 0.9 04/30/2010 0406   GFRNONAA >  90 04/04/2013 0732   GFRAA >90 04/04/2013 0732    No results found for this basename: SPEP, UPEP,  kappa and lambda light chains    Lab Results  Component Value Date   WBC 8.9 06/10/2013   NEUTROABS 7.4* 06/10/2013   HGB 12.9* 06/10/2013   HCT 39.2 06/10/2013   MCV 87.2 06/10/2013   PLT 215 06/10/2013      Chemistry      Component Value Date/Time   NA 139 06/10/2013 0843   NA 141 04/04/2013 0732   K 4.5 06/10/2013 0843   K 3.9 04/04/2013 0732   CL 103 04/04/2013 0732   CO2 28 06/10/2013 0843   CO2 25 04/04/2013 0732   BUN 9.4 06/10/2013 0843   BUN 12 04/04/2013 0732   CREATININE 0.7 06/10/2013 0843   CREATININE 0.72 04/04/2013 0732      Component Value Date/Time   CALCIUM 9.6 06/10/2013 0843   CALCIUM 9.3 04/04/2013 0732   ALKPHOS 84 04/28/2013 1132   ALKPHOS 75 04/30/2010 0406   AST 69* 04/28/2013 1132   AST 31 04/30/2010 0406   ALT 41 04/28/2013 1132   ALT 30 04/30/2010 0406   BILITOT 0.37 04/28/2013 1132   BILITOT 0.9 04/30/2010 0406     ASSESSMENT & PLAN:  Tonsil cancer He tolerated treatment poorly. He has extensive skin ulceration on his neck with associated rash from Erbitux. I recommend we  discontinue treatment now rather than to give him his last dose of Erbitux next week. I will continue supportive care.  Diabetes mellitus, type 2 His blood sugars remain under control. Continue close monitoring of his blood sugar.  Protein-calorie malnutrition, severe Thankfully, he is able to tolerate a cans of nutritional supplement and has not lost any weight.  Rash His skin now has become ulcerated. There are two components of skin toxicity, one is related to chemotherapy and the other is due to radiation. I refilled prescription clindamycin gel. I would discontinue Erbitux due to this. The patient will continue radiation therapy until next week.  Mucositis (ulcerative) due to antineoplastic therapy This is getting significantly worse. I refill his pain prescription today. Continue conservative management with pain medicine as needed. Hopefully, with discontinuation of chemotherapy, this will start to heal by next week.   All questions were answered. The patient knows to call the clinic with any problems, questions or concerns. No barriers to learning was detected.    Heath Lark, MD 06/17/2013 10:45 AM

## 2013-06-20 ENCOUNTER — Ambulatory Visit
Admission: RE | Admit: 2013-06-20 | Discharge: 2013-06-20 | Disposition: A | Discharge status: Home / Self Care | Payer: 59 | Source: Ambulatory Visit | Attending: Radiation Oncology | Admitting: Radiation Oncology

## 2013-06-20 ENCOUNTER — Encounter: Payer: Self-pay | Admitting: Radiation Oncology

## 2013-06-20 ENCOUNTER — Ambulatory Visit: Payer: Self-pay

## 2013-06-20 ENCOUNTER — Ambulatory Visit: Payer: 59 | Admitting: Nutrition

## 2013-06-20 VITALS — BP 107/59 | HR 93 | Temp 98.4°F | Resp 20 | Wt 236.4 lb

## 2013-06-20 DIAGNOSIS — C099 Malignant neoplasm of tonsil, unspecified: Secondary | ICD-10-CM

## 2013-06-20 MED ORDER — BIAFINE EX EMUL
Freq: Two times a day (BID) | CUTANEOUS | Status: DC
  Administered 2013-06-20: 10:00:00 via TOPICAL

## 2013-06-20 NOTE — Progress Notes (Signed)
   Weekly Management Note:  Outpatient Current Dose:  66 Gy  Projected Dose: 70 Gy   Narrative:  The patient presents for routine under treatment assessment.  CBCT/MVCT images/Port film x-rays were reviewed.  The chart was checked. He is doing well.  Throat irritated but eating full meals with MMW beforehand.  Skin burns, and Erbitux held today.  Physical Findings:  weight is 236 lb 6.4 oz (107.23 kg). His temperature is 98.4 F (36.9 C). His blood pressure is 107/59 and his pulse is 93. His respiration is 20.  Diffuse dry desquamation of neck; oral cavity - thick saliva, diffuse erythema, confluent oropharyngeal mucositis. No thrush  CBC    Component Value Date/Time   WBC 8.9 06/10/2013 0843   WBC 8.8 04/04/2013 0732   RBC 4.50 06/10/2013 0843   RBC 4.89 04/04/2013 0732   HGB 12.9* 06/10/2013 0843   HGB 15.0 04/04/2013 0732   HCT 39.2 06/10/2013 0843   HCT 43.9 04/04/2013 0732   PLT 215 06/10/2013 0843   PLT 171 04/04/2013 0732   MCV 87.2 06/10/2013 0843   MCV 89.8 04/04/2013 0732   MCH 28.6 06/10/2013 0843   MCH 30.7 04/04/2013 0732   MCHC 32.8 06/10/2013 0843   MCHC 34.2 04/04/2013 0732   RDW 13.8 06/10/2013 0843   RDW 14.3 04/04/2013 0732   LYMPHSABS 0.3* 06/10/2013 0843   LYMPHSABS 1.4 10/17/2012 2222   MONOABS 0.7 06/10/2013 0843   MONOABS 0.5 10/17/2012 2222   EOSABS 0.4 06/10/2013 0843   EOSABS 0.3 10/17/2012 2222   BASOSABS 0.1 06/10/2013 0843   BASOSABS 0.0 10/17/2012 2222     CMP     Component Value Date/Time   NA 139 06/10/2013 0843   NA 141 04/04/2013 0732   K 4.5 06/10/2013 0843   K 3.9 04/04/2013 0732   CL 103 04/04/2013 0732   CO2 28 06/10/2013 0843   CO2 25 04/04/2013 0732   GLUCOSE 150* 06/10/2013 0843   GLUCOSE 136* 04/04/2013 0732   BUN 9.4 06/10/2013 0843   BUN 12 04/04/2013 0732   CREATININE 0.7 06/10/2013 0843   CREATININE 0.72 04/04/2013 0732   CALCIUM 9.6 06/10/2013 0843   CALCIUM 9.3 04/04/2013 0732   PROT 8.4* 04/28/2013 1132   PROT 7.1 04/30/2010 0406   ALBUMIN 3.8  04/28/2013 1132   ALBUMIN 2.7* 04/30/2010 0406   AST 69* 04/28/2013 1132   AST 31 04/30/2010 0406   ALT 41 04/28/2013 1132   ALT 30 04/30/2010 0406   ALKPHOS 84 04/28/2013 1132   ALKPHOS 75 04/30/2010 0406   BILITOT 0.37 04/28/2013 1132   BILITOT 0.9 04/30/2010 0406   GFRNONAA >90 04/04/2013 0732   GFRAA >90 04/04/2013 0732     Impression:  The patient is tolerating radiotherapy.   Plan:  Continue radiotherapy as planned. continue good weight maintenance and nutrition. Card given for f/u in 2-3 weeks.  Will see my partner on last treatment day. Consider silvadene if diffuse moist desquamation erupts.  -----------------------------------  Eppie Gibson, MD

## 2013-06-20 NOTE — Addendum Note (Signed)
Encounter addended by: Andria Rhein, RN on: 06/20/2013 10:17 AM<BR>     Documentation filed: Inpatient MAR, Orders

## 2013-06-20 NOTE — Progress Notes (Signed)
Pt's chemo today cancelled per Dr Alvy Bimler due to skin reaction. He is applying clindamycin, Biafine to skin in face/neck. He has dry to moist desquamation, some exudate, no signs of infection noted. Tongue without signs of thrush. Pt eating soft foods, taking in 8 cans Boost either PO or by peg tube daily. Pt states his throat is slightly sore.

## 2013-06-20 NOTE — Progress Notes (Signed)
Nutrition follow-up completed with patient and daughter in radiation oncology examination room. Pt has discontinued chemotherapy for tonsil cancer, but is still receiving radiation therapy through this week. Per daughter, pt is tolerating 6 cans of Osmolite 1.5 daily via feeding tube with flushes without difficulty. Pt has continued to drink 4 Ensure supplements per day between bolus feedings. He is continuing to eat soft foods as well. Pt denies nausea and vomiting, constipation, or diarrhea. [name] reports that pt's weight dropped three lbs in one day for unknown reasons, but has remained stable. Will continue to follow-up with pt to see if he has any further weight loss. Blood glucose remains controlled, and pt remains off oral diabetic medications.   Re-estimated nutrition needs: 2250-2500 calories, 130-145 g protein, 2.5 L fluid daily.  6 cans Osmolite 1.5 with 120 ml free water before and after each bolus feeding provides 2130 calories, 89.4 g protein, and 2046 mL of free water  Nutrition diagnosis: Inadequate oral intake continues but has improved.  Intervention: Patient educated to continue 6 cans of osmolite 1.5 via feeding tube with 120 ml free water flushes before and after each bolus feeding. Pt should continue 1-1 1/2 cans QID. Pt encouraged to continue PO intake as tolerated and to include high-protein, high-calorie foods. Pt also encouraged to continue oral nutrition supplements as tolerated to promote weight maintenance.   Next Visit: Scheduling has been contacted.

## 2013-06-21 ENCOUNTER — Ambulatory Visit
Admission: RE | Admit: 2013-06-21 | Discharge: 2013-06-21 | Disposition: A | Discharge status: Home / Self Care | Payer: 59 | Source: Ambulatory Visit | Attending: Radiation Oncology | Admitting: Radiation Oncology

## 2013-06-22 ENCOUNTER — Ambulatory Visit
Admission: RE | Admit: 2013-06-22 | Discharge: 2013-06-22 | Disposition: A | Discharge status: Home / Self Care | Payer: 59 | Source: Ambulatory Visit | Attending: Radiation Oncology | Admitting: Radiation Oncology

## 2013-06-22 ENCOUNTER — Encounter: Payer: Self-pay | Admitting: Radiation Oncology

## 2013-06-22 ENCOUNTER — Encounter: Payer: Self-pay | Admitting: *Deleted

## 2013-06-22 DIAGNOSIS — C099 Malignant neoplasm of tonsil, unspecified: Secondary | ICD-10-CM

## 2013-06-22 MED ORDER — BIAFINE EX EMUL
CUTANEOUS | Status: DC | PRN
  Administered 2013-06-22: 16:00:00 via TOPICAL

## 2013-06-22 MED ORDER — SILVER SULFADIAZINE 1 % EX CREA
TOPICAL_CREAM | Freq: Two times a day (BID) | CUTANEOUS | Status: DC
  Administered 2013-06-22: 17:00:00 via TOPICAL

## 2013-06-22 NOTE — Progress Notes (Signed)
  Radiation Oncology         [phone] ________________________________  Name: [name] MRN: [id number]  Date: 06/22/2013  DOB: [birth year]  End of Treatment Note  Diagnosis:   Squamous Cell Carcinoma of the right tonsil, p16+, T3N2cM0, Stage IVA  Indication for treatment:  curative       Radiation treatment dates:  05/02/2013-06/22/2013  Site/dose:   Right tonsil and bilateral neck / 70 Gy in 35 fractions to gross disease, 63 Gy in 35 fractions to high risk nodal echelons, and 56 Gy in 35 fractions to intermediate risk nodal echelons  Beams/energy:   Helical IMRT / 6 MV photons  Narrative: The patient tolerated radiation treatment and cetuximab relatively well with odynophagia, skin irritation, thick saliva, and mucositis.  Supportive medical care provided with the help of med/onc.    Plan: The patient has completed radiation treatment. The patient will return to radiation oncology clinic for routine followup in one week. I advised them to call or return sooner if they have any questions or concerns related to their recovery or treatment.  -----------------------------------  Eppie Gibson, MD

## 2013-06-22 NOTE — Progress Notes (Signed)
  Radiation Oncology         [phone] ________________________________  Name: [name] MRN: [id number]  Date: 06/22/2013  DOB: 15-Feb-1950  Weekly Radiation Therapy Management Tonsil cancer   Primary site: Pharynx - Oropharynx (Right)   Staging method: AJCC 7th Edition   Clinical free text: HPV positive   Clinical: Stage IVA (T3, N2c, M0)    Summary: Stage IVA (T3, N2c, M0)  Current Dose: 70 Gy     Planned Dose:  70 Gy  Narrative . . . . . . . . The patient presents for routine under treatment assessment.                                   He is happy to complete his radiation therapy today.  Accompanied by wife and daughter today.                                 Set-up films were reviewed.                                 The chart was checked. Physical Findings. . .  vitals were not taken for this visit.[name] Confluent mucositis in the pharyngeal area. No hemorrhagic mucositis noted. The neck area shows diffuse erythema and dry desquamation, and impending moist desquamation Impression . . . . . . . The patient is tolerated radiation. Plan . . . . . . . . . . . . close followup in radiation oncology, approximately 7-10 days from now in light of the patient's skin reaction. Patient will also be placed on Silvadene.   ________________________________   Blair Promise, PhD, MD

## 2013-06-22 NOTE — Progress Notes (Signed)
Pt is presenting a large amount of moist desquamation covering his neck and chest.  Dr Isidore Moos recommended Silvadene.

## 2013-06-22 NOTE — Progress Notes (Signed)
Met with pt and his family during final RT to offer support and to celebrate end of radiation treatment.  I explained that my role as navigator will continue for several more months and that I will be calling and/or joining them during follow-up visits.  They verbalized understanding.  Presented patient's wife and dtr certificates of recognition for their care and support.  Initiating navigation as L3 patient (treatments completed) with this encounter.  Gayleen Orem, RN, BSN, [facility] Head & Neck Oncology Navigator [phone]

## 2013-06-23 ENCOUNTER — Telehealth: Payer: Self-pay | Admitting: Hematology and Oncology

## 2013-06-23 NOTE — Telephone Encounter (Signed)
per staff message to call & f/u w/pt appt-left appt times & date on VM. Adv if any addtl info needed to call us [phone]

## 2013-06-28 ENCOUNTER — Telehealth: Payer: Self-pay | Admitting: Hematology and Oncology

## 2013-06-28 ENCOUNTER — Ambulatory Visit: Payer: Self-pay | Admitting: Hematology and Oncology

## 2013-06-28 ENCOUNTER — Encounter: Payer: Self-pay | Admitting: Radiation Oncology

## 2013-06-28 ENCOUNTER — Other Ambulatory Visit: Payer: Self-pay

## 2013-06-28 NOTE — Telephone Encounter (Signed)
, °

## 2013-06-29 ENCOUNTER — Ambulatory Visit
Admission: RE | Admit: 2013-06-29 | Discharge: 2013-06-29 | Disposition: A | Discharge status: Home / Self Care | Payer: 59 | Source: Ambulatory Visit | Attending: Radiation Oncology | Admitting: Radiation Oncology

## 2013-06-29 ENCOUNTER — Encounter: Payer: Self-pay | Admitting: Radiation Oncology

## 2013-06-29 VITALS — BP 126/71 | HR 102 | Temp 98.6°F | Resp 20 | Ht 74.0 in | Wt 232.6 lb

## 2013-06-29 DIAGNOSIS — Z79899 Other long term (current) drug therapy: Secondary | ICD-10-CM | POA: Insufficient documentation

## 2013-06-29 DIAGNOSIS — Z923 Personal history of irradiation: Secondary | ICD-10-CM | POA: Insufficient documentation

## 2013-06-29 DIAGNOSIS — Z931 Gastrostomy status: Secondary | ICD-10-CM | POA: Insufficient documentation

## 2013-06-29 DIAGNOSIS — C099 Malignant neoplasm of tonsil, unspecified: Secondary | ICD-10-CM | POA: Insufficient documentation

## 2013-06-29 DIAGNOSIS — J029 Acute pharyngitis, unspecified: Secondary | ICD-10-CM | POA: Insufficient documentation

## 2013-06-29 HISTORY — DX: Personal history of irradiation: Z92.3

## 2013-06-29 MED ORDER — SILVER SULFADIAZINE 1 % EX CREA
TOPICAL_CREAM | Freq: Two times a day (BID) | CUTANEOUS | Status: DC
  Administered 2013-06-29: 17:00:00 via TOPICAL

## 2013-06-29 MED ORDER — BIAFINE EX EMUL
Freq: Two times a day (BID) | CUTANEOUS | Status: DC
  Administered 2013-06-29: 13:00:00 via TOPICAL

## 2013-06-29 NOTE — Progress Notes (Signed)
Radiation Oncology         [phone] ________________________________  Name: Dakin Madani Yokum MRN: [id number]  Date: 06/29/2013  DOB: Jul 22, 1950  Follow-Up Visit Note  CC: Purvis Kilts, MD  Heath Lark, MD  Diagnosis and Prior Radiotherapy:   Squamous Cell Carcinoma of the right tonsil, p16+, T3N2cM0, Stage IVA  Indication for treatment: curative  Radiation treatment dates: 05/02/2013-06/22/2013  Site/dose: Right tonsil and bilateral neck / 70 Gy in 35 fractions to gross disease, 63 Gy in 35 fractions to high risk nodal echelons, and 56 Gy in 35 fractions to intermediate risk nodal echelons   Narrative:  The patient returns today for routine follow-up. He has fatigue, Generalized Weakness, suboptimal appetite, pt states he doesn't want to eat sometimes due to the heat. He is consuming 2-3 cans of Ensure plus multiple meals/snacks. His last appt he weighed approx 236 lbs and today is weighing 232.6lbs so an approx. Weight loss of 4 lbs. Pt complains of skin sensitiveness and modestly sore throat. He has moist and dry desquamation and erythema over his neck. He is applying Silvadene twice a day.   He is using Magic Mouth wash before he is eating. The patient snacks frequently.  No constipation.  Overall, feels like he is improving significantly. Family is pleased with his healing so far.  ALLERGIES:  is allergic to neurontin and dilaudid.  Meds: Current Outpatient Prescriptions  Medication Sig Dispense Refill  . acetaminophen (TYLENOL) 500 MG tablet Take 500-1,000 mg by mouth every 4 (four) hours as needed for mild pain.       [name] alprazolam (XANAX) 2 MG tablet Take 2 mg by mouth 3 (three) times daily as needed for sleep or anxiety.       . Alum & Mag Hydroxide-Simeth (MAGIC MOUTHWASH W/LIDOCAINE) SOLN 1part nystatin,1part Maaloxplus,1part benadryl,3part 2%viscous lidocaine. Swish/swallow 10 mL up to QID, 49min before meals/bedtime  480 mL  5  . Cholecalciferol (VITAMIN D3) 5000 UNITS  CAPS Take 5,000 Units by mouth daily.      . citalopram (CELEXA) 20 MG tablet Take 20 mg by mouth daily.      . clindamycin (CLINDAGEL) 1 % gel Apply topically 2 (two) times daily. To affected area.  60 g  1  . diphenhydrAMINE (BENADRYL) 25 mg capsule Take 1 capsule (25 mg total) by mouth every 6 (six) hours as needed for itching.  60 capsule  0  . docusate sodium (COLACE) 100 MG capsule Take 100 mg by mouth 2 (two) times daily as needed for mild constipation.      [name] emollient (BIAFINE) cream Apply topically 2 (two) times daily. Apply.t after radiation treatent and bedtime.      . hydrocortisone cream 1 % Apply 1 application topically 2 (two) times daily.  30 g  0  . lidocaine-prilocaine (EMLA) cream Apply 1 application topically as needed.  30 g  0  . morphine (ROXANOL) 20 MG/ML concentrated solution Take 0.5 mLs (10 mg total) by mouth every 2 (two) hours as needed for severe pain.  240 mL  0  . ondansetron (ZOFRAN) 8 MG tablet Take 1 tablet (8 mg total) by mouth every 8 (eight) hours as needed for nausea.  60 tablet  3  . pregabalin (LYRICA) 150 MG capsule Take 300 mg by mouth 2 (two) times daily.       . promethazine (PHENERGAN) 25 MG tablet Take 1 tablet (25 mg total) by mouth every 6 (six) hours as needed for nausea.  60 tablet  3  . silver sulfADIAZINE (SILVADENE) 1 % cream Apply 1 application topically 2 (two) times daily.      . sucralfate (CARAFATE) 1 G tablet dissolve 1 tablet in 10 mL H20 and swallow up to four times a day to soothe throat.  60 tablet  5  . fentaNYL (DURAGESIC - DOSED MCG/HR) 75 MCG/HR Place 1 patch (75 mcg total) onto the skin every 3 (three) days.  5 patch  0   No current facility-administered medications for this encounter.    Physical Findings: The patient is in no acute distress. Patient is alert and oriented.  height is 6\' 2"  (1.88 m) and weight is 232 lb 9.6 oz (105.507 kg). His oral temperature is 98.6 F (37 C). His blood pressure is 126/71 and his pulse is  102. His respiration is 20 and oxygen saturation is 98%. .  Mostly dry desquamation with minimal moistness throughout neck.  Oral cavity - brisk gag reflex, no thrush, mucosa intact. Could not see oropharynx due to gag reflex.   Lab Findings: Lab Results  Component Value Date   WBC 8.9 06/10/2013   HGB 12.9* 06/10/2013   HCT 39.2 06/10/2013   MCV 87.2 06/10/2013   PLT 215 06/10/2013    Lab Results  Component Value Date   TSH 0.780 03/29/2013    Radiographic Findings: No results found.  Impression/Plan:    1) Head and Neck Cancer Status: healing from RT  2) Nutritional Status:  - weight: slight decrease - PEG tube:still supplementing with this  3) Risk Factors: The patient has been educated about risk factors including alcohol and tobacco abuse; they understand that avoidance of alcohol and tobacco is important to prevent recurrences as well as other cancers  4) Swallowing: functional, continue soft foods  5) Dental: edentulous  6) Energy: improving, follow TSH in future  7) Social: No active social issues to address at this time  8) Other: skin is improving on Silvadene. Refill given today.  May start Biafine once skin dries up further and silvadene is used up.  9) Follow-up in 1.5 months. The patient was encouraged to call with any issues or questions before then.  _____________________________________   Eppie Gibson, MD

## 2013-06-29 NOTE — Progress Notes (Signed)
He rates his pain as a 4 on a scale of 0-10.  Pt complains of Fatigue, Generalized Weakness, Poor Appetite, pt states he doesn't want to eat sometimes due to the heat. He is consuming 2-3 cans of Ensure plus multiple meals/snacks. His last appt he weighed approx 236 lbs and today is weighing 232.6lbs so an approx. Weight loss of 4 lbs. Pt complains of skin sensitiveness and sore throat.  He has moist and dry desquamation and erythema over his neck.  He is applying Silvadene twice a day.   Pt presenting appropriate quality, quantity and organization of sentences. Pt states it hurts sometimes when he is swallowing, he is using Magic Mouth wash before he is eating. The patient snacks frequently.  His tongue looks moist, possibly some white areas on the posterior portion of his tongue.  Will notify Dr Isidore Moos to exam further.

## 2013-06-30 ENCOUNTER — Encounter: Payer: Self-pay | Admitting: *Deleted

## 2013-06-30 ENCOUNTER — Telehealth: Payer: Self-pay | Admitting: Hematology and Oncology

## 2013-06-30 ENCOUNTER — Ambulatory Visit (HOSPITAL_BASED_OUTPATIENT_CLINIC_OR_DEPARTMENT_OTHER): Payer: 59 | Admitting: Hematology and Oncology

## 2013-06-30 ENCOUNTER — Other Ambulatory Visit (HOSPITAL_BASED_OUTPATIENT_CLINIC_OR_DEPARTMENT_OTHER): Payer: 59

## 2013-06-30 ENCOUNTER — Encounter: Payer: Self-pay | Admitting: Hematology and Oncology

## 2013-06-30 VITALS — BP 115/69 | HR 86 | Temp 98.3°F | Resp 18 | Ht 74.0 in | Wt 230.4 lb

## 2013-06-30 DIAGNOSIS — B977 Papillomavirus as the cause of diseases classified elsewhere: Secondary | ICD-10-CM

## 2013-06-30 DIAGNOSIS — E43 Unspecified severe protein-calorie malnutrition: Secondary | ICD-10-CM

## 2013-06-30 DIAGNOSIS — C099 Malignant neoplasm of tonsil, unspecified: Secondary | ICD-10-CM

## 2013-06-30 DIAGNOSIS — K1231 Oral mucositis (ulcerative) due to antineoplastic therapy: Secondary | ICD-10-CM

## 2013-06-30 DIAGNOSIS — E1142 Type 2 diabetes mellitus with diabetic polyneuropathy: Secondary | ICD-10-CM

## 2013-06-30 DIAGNOSIS — E119 Type 2 diabetes mellitus without complications: Secondary | ICD-10-CM

## 2013-06-30 DIAGNOSIS — R070 Pain in throat: Secondary | ICD-10-CM

## 2013-06-30 DIAGNOSIS — R21 Rash and other nonspecific skin eruption: Secondary | ICD-10-CM

## 2013-06-30 LAB — BASIC METABOLIC PANEL (CC13)
ANION GAP: 9 meq/L (ref 3–11)
BUN: 8.7 mg/dL (ref 7.0–26.0)
CALCIUM: 9.8 mg/dL (ref 8.4–10.4)
CO2: 30 mEq/L — ABNORMAL HIGH (ref 22–29)
Chloride: 101 mEq/L (ref 98–109)
Creatinine: 0.7 mg/dL (ref 0.7–1.3)
Glucose: 157 mg/dl — ABNORMAL HIGH (ref 70–140)
Potassium: 4.1 mEq/L (ref 3.5–5.1)
SODIUM: 140 meq/L (ref 136–145)

## 2013-06-30 LAB — CBC WITH DIFFERENTIAL/PLATELET
BASO%: 0.6 % (ref 0.0–2.0)
BASOS ABS: 0 10*3/uL (ref 0.0–0.1)
EOS%: 4 % (ref 0.0–7.0)
Eosinophils Absolute: 0.3 10*3/uL (ref 0.0–0.5)
HCT: 40.9 % (ref 38.4–49.9)
HGB: 13 g/dL (ref 13.0–17.1)
LYMPH#: 0.5 10*3/uL — AB (ref 0.9–3.3)
LYMPH%: 7.4 % — ABNORMAL LOW (ref 14.0–49.0)
MCH: 27.4 pg (ref 27.2–33.4)
MCHC: 31.8 g/dL — ABNORMAL LOW (ref 32.0–36.0)
MCV: 86.3 fL (ref 79.3–98.0)
MONO#: 0.4 10*3/uL (ref 0.1–0.9)
MONO%: 6.6 % (ref 0.0–14.0)
NEUT#: 5 10*3/uL (ref 1.5–6.5)
NEUT%: 81.4 % — ABNORMAL HIGH (ref 39.0–75.0)
Platelets: 271 10*3/uL (ref 140–400)
RBC: 4.74 10*6/uL (ref 4.20–5.82)
RDW: 14.1 % (ref 11.0–14.6)
WBC: 6.2 10*3/uL (ref 4.0–10.3)

## 2013-06-30 LAB — MAGNESIUM (CC13): Magnesium: 2.2 mg/dl (ref 1.5–2.5)

## 2013-06-30 MED ORDER — MORPHINE SULFATE (CONCENTRATE) 20 MG/ML PO SOLN: 10.0000 mg | ORAL | Status: DC | PRN

## 2013-06-30 MED ORDER — FENTANYL 75 MCG/HR TD PT72: 75.0000 ug | MEDICATED_PATCH | TRANSDERMAL | Status: DC

## 2013-06-30 NOTE — Assessment & Plan Note (Signed)
He tolerated treatment poorly. He has extensive skin ulceration on his neck with associated rash from Erbitux. I will continue supportive care.

## 2013-06-30 NOTE — Assessment & Plan Note (Signed)
Since the patient has started eating, I told him to resume metformin

## 2013-06-30 NOTE — Assessment & Plan Note (Signed)
His skin now has become ulcerated. There are two components of skin toxicity, one is related to chemotherapy and the other is due to radiation. He will continue on prescription clindamycin gel. Continue supportive care. This will continue to improve.

## 2013-06-30 NOTE — Progress Notes (Signed)
To provide support, encouragement and care continuity, met with patient and his sister-in-law during follow-up appt with Dr. Gorsuch.  Patient reported improved swallowing, reduced neck and throat pain.  Visually, neck desquamation noticeably improved since last week; areas of neck with  erythema but no oozing areas.  Patient reported he continues to apply Silvadene BID.  Using Teach Back, reviewed procedure for washing neck.  Encourage patient to call me should he have any needs.  He verbalized understanding.   Continuing to navigate as L3 (treatments completed) patient.  Rick Diehl, RN, BSN, CHPN Head & Neck Oncology Navigator [phone]   

## 2013-06-30 NOTE — Telephone Encounter (Signed)
gv adn printed appt sched and avs for pt for July  °

## 2013-06-30 NOTE — Assessment & Plan Note (Signed)
Thankfully, he is able to tolerate a cans of nutritional supplement and has not lost any weight. The patient is attempting to swallow soft diet. I continue to encourage him.

## 2013-06-30 NOTE — Assessment & Plan Note (Signed)
This is getting better I refill his pain prescription today. Continue conservative management with pain medicine as needed. Hopefully, with discontinuation of chemo and radiation therapy, this will start to heal by next week.

## 2013-06-30 NOTE — Progress Notes (Signed)
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 There is also a left thyroid mass measured 44 mm   03/21/2013 Procedure The patient was seen by ENT with laryngoscopy and biopsy. Pathology is pending   03/25/2013 Imaging PET scan showed large hypermetabolic soft tissue mass in the region of the right palatine tonsil with bilateral cervical hypermetabolic lymphadenopathy indicative of metastatic disease,   04/04/2013 Surgery He underwent placement of Port-A-Cath and feeding tube   04/14/2013 Procedure The patient underwent teeth extraction.   05/02/2013 - 06/13/2013 Chemotherapy The dosage of chemotherapy was interrupted many times due to side effects. His treatment is terminated early due to severe ulcerated skin toxicity.   05/02/2013 - 06/22/2013 Radiation Therapy The patient completed radiation treatment.    INTERVAL HISTORY: Please see below for problem oriented charting. Overall, he is improving apart from worsening skin ulceration. He has experimented with eating oral soft food.  REVIEW OF SYSTEMS:   Constitutional: Denies fevers,  chills or abnormal weight loss Eyes: Denies blurriness of vision Respiratory: Denies cough, dyspnea or wheezes Cardiovascular: Denies palpitation, chest discomfort or lower extremity swelling Gastrointestinal:  Denies nausea, heartburn or change in bowel habits Lymphatics: Denies new lymphadenopathy or easy bruising Neurological:Denies numbness, tingling or new weaknesses Behavioral/Psych: Mood is stable, no new changes  All other systems were reviewed with the patient and are negative.  I have reviewed the past medical history, past surgical history, social history and family history with the patient and they are unchanged from previous note.  ALLERGIES:  is allergic to neurontin and dilaudid.  MEDICATIONS:  Current Outpatient Prescriptions  Medication Sig Dispense Refill  . acetaminophen (TYLENOL) 500 MG tablet Take 500-1,000 mg by mouth every 4 (four) hours as needed for mild pain.       [name] alprazolam (XANAX) 2 MG tablet Take 2 mg by mouth 3 (three) times daily as needed for sleep or anxiety.       . Alum & Mag Hydroxide-Simeth (MAGIC MOUTHWASH W/LIDOCAINE) SOLN 1part nystatin,1part Maaloxplus,1part benadryl,3part 2%viscous lidocaine. Swish/swallow 10 mL up to QID, 18min before meals/bedtime  480 mL  5  . Cholecalciferol (VITAMIN D3) 5000 UNITS CAPS Take 5,000 Units by mouth daily.      . citalopram (CELEXA) 20 MG tablet Take 20 mg by mouth daily.      . clindamycin (CLINDAGEL) 1 % gel Apply topically 2 (two) times daily. To affected area.  60 g  1  . diphenhydrAMINE (BENADRYL) 25 mg capsule Take 1 capsule (25 mg total) by mouth every 6 (six) hours as needed for itching.  60 capsule  0  .  docusate sodium (COLACE) 100 MG capsule Take 100 mg by mouth 2 (two) times daily as needed for mild constipation.      . fentaNYL (DURAGESIC - DOSED MCG/HR) 75 MCG/HR Place 1 patch (75 mcg total) onto the skin every 3 (three) days.  5 patch  0  . hydrocortisone cream 1 % Apply 1 application topically 2  (two) times daily.  30 g  0  . lidocaine-prilocaine (EMLA) cream Apply 1 application topically as needed.  30 g  0  . morphine (ROXANOL) 20 MG/ML concentrated solution Take 0.5 mLs (10 mg total) by mouth every 2 (two) hours as needed for severe pain.  240 mL  0  . ondansetron (ZOFRAN) 8 MG tablet Take 1 tablet (8 mg total) by mouth every 8 (eight) hours as needed for nausea.  60 tablet  3  . promethazine (PHENERGAN) 25 MG tablet Take 1 tablet (25 mg total) by mouth every 6 (six) hours as needed for nausea.  60 tablet  3  . silver sulfADIAZINE (SILVADENE) 1 % cream Apply 1 application topically 2 (two) times daily.      . sucralfate (CARAFATE) 1 G tablet dissolve 1 tablet in 10 mL H20 and swallow up to four times a day to soothe throat.  60 tablet  5  . emollient (BIAFINE) cream Apply topically 2 (two) times daily. Apply.t after radiation treatent and bedtime.       No current facility-administered medications for this visit.    PHYSICAL EXAMINATION: ECOG PERFORMANCE STATUS: 1 - Symptomatic but completely ambulatory  Filed Vitals:   06/30/13 1016  BP: 115/69  Pulse: 86  Temp: 98.3 F (36.8 C)  Resp: 18   Filed Weights   06/30/13 1016  Weight: 230 lb 6.4 oz (104.509 kg)    GENERAL:alert, no distress and comfortable SKIN: Significant skin ulceration around his neck is noted. EYES: normal, Conjunctiva are pink and non-injected, sclera clear OROPHARYNX:no exudate, no erythema and lips, buccal mucosa, and tongue normal . The mucositis is healing with no oral thrush NECK: supple, thyroid normal size, non-tender, without nodularity LYMPH:  no palpable lymphadenopathy in the cervical, axillary or inguinal LUNGS: clear to auscultation and percussion with normal breathing effort HEART: regular rate & rhythm and no murmurs and no lower extremity edema ABDOMEN:abdomen soft, non-tender and normal bowel sounds Musculoskeletal:no cyanosis of digits and no clubbing  NEURO: alert & oriented x 3  with fluent speech, no focal motor/sensory deficits  LABORATORY DATA:  I have reviewed the data as listed    Component Value Date/Time   NA 140 06/30/2013 0948   NA 141 04/04/2013 0732   K 4.1 06/30/2013 0948   K 3.9 04/04/2013 0732   CL 103 04/04/2013 0732   CO2 30* 06/30/2013 0948   CO2 25 04/04/2013 0732   GLUCOSE 157* 06/30/2013 0948   GLUCOSE 136* 04/04/2013 0732   BUN 8.7 06/30/2013 0948   BUN 12 04/04/2013 0732   CREATININE 0.7 06/30/2013 0948   CREATININE 0.72 04/04/2013 0732   CALCIUM 9.8 06/30/2013 0948   CALCIUM 9.3 04/04/2013 0732   PROT 8.4* 04/28/2013 1132   PROT 7.1 04/30/2010 0406   ALBUMIN 3.8 04/28/2013 1132   ALBUMIN 2.7* 04/30/2010 0406   AST 69* 04/28/2013 1132   AST 31 04/30/2010 0406   ALT 41 04/28/2013 1132   ALT 30 04/30/2010 0406   ALKPHOS 84 04/28/2013 1132   ALKPHOS 75 04/30/2010 0406   BILITOT 0.37 04/28/2013 1132   BILITOT 0.9 04/30/2010  0406   GFRNONAA >90 04/04/2013 0732   GFRAA >90 04/04/2013 0732    No results found for this basename: SPEP, UPEP,  kappa and lambda light chains    Lab Results  Component Value Date   WBC 6.2 06/30/2013   NEUTROABS 5.0 06/30/2013   HGB 13.0 06/30/2013   HCT 40.9 06/30/2013   MCV 86.3 06/30/2013   PLT 271 06/30/2013      Chemistry      Component Value Date/Time   NA 140 06/30/2013 0948   NA 141 04/04/2013 0732   K 4.1 06/30/2013 0948   K 3.9 04/04/2013 0732   CL 103 04/04/2013 0732   CO2 30* 06/30/2013 0948   CO2 25 04/04/2013 0732   BUN 8.7 06/30/2013 0948   BUN 12 04/04/2013 0732   CREATININE 0.7 06/30/2013 0948   CREATININE 0.72 04/04/2013 0732      Component Value Date/Time   CALCIUM 9.8 06/30/2013 0948   CALCIUM 9.3 04/04/2013 0732   ALKPHOS 84 04/28/2013 1132   ALKPHOS 75 04/30/2010 0406   AST 69* 04/28/2013 1132   AST 31 04/30/2010 0406   ALT 41 04/28/2013 1132   ALT 30 04/30/2010 0406   BILITOT 0.37 04/28/2013 1132   BILITOT 0.9 04/30/2010 0406     ASSESSMENT & PLAN:  Tonsil cancer He tolerated treatment poorly. He has  extensive skin ulceration on his neck with associated rash from Erbitux. I will continue supportive care.    Rash His skin now has become ulcerated. There are two components of skin toxicity, one is related to chemotherapy and the other is due to radiation. He will continue on prescription clindamycin gel. Continue supportive care. This will continue to improve.    Protein-calorie malnutrition, severe Thankfully, he is able to tolerate a cans of nutritional supplement and has not lost any weight. The patient is attempting to swallow soft diet. I continue to encourage him.   Mucositis (ulcerative) due to antineoplastic therapy This is getting better I refill his pain prescription today. Continue conservative management with pain medicine as needed. Hopefully, with discontinuation of chemo and radiation therapy, this will start to heal by next week.    Diabetes mellitus, type 2 Since the patient has started eating, I told him to resume metformin   No orders of the defined types were placed in this encounter.   All questions were answered. The patient knows to call the clinic with any problems, questions or concerns. No barriers to learning was detected. I spent 25 minutes counseling the patient face to face. The total time spent in the appointment was 30 minutes and more than 50% was on counseling and review of test results     [facility], [place], MD 06/30/2013 8:07 PM

## 2013-07-11 ENCOUNTER — Telehealth: Payer: Self-pay | Admitting: Hematology and Oncology

## 2013-07-11 NOTE — Telephone Encounter (Signed)
cld pt & spoke to Donna(wife)& adv appt chgd to 3:15-wife understood

## 2013-07-13 ENCOUNTER — Ambulatory Visit: Payer: Self-pay | Admitting: Radiation Oncology

## 2013-07-13 ENCOUNTER — Ambulatory Visit: Payer: Self-pay | Admitting: Hematology and Oncology

## 2013-07-14 ENCOUNTER — Ambulatory Visit: Payer: Self-pay | Admitting: Hematology and Oncology

## 2013-07-20 ENCOUNTER — Telehealth: Payer: Self-pay | Admitting: Hematology and Oncology

## 2013-07-20 ENCOUNTER — Telehealth: Payer: Self-pay | Admitting: *Deleted

## 2013-07-20 ENCOUNTER — Ambulatory Visit (HOSPITAL_COMMUNITY): Payer: Self-pay | Admitting: Dentistry

## 2013-07-20 ENCOUNTER — Encounter (HOSPITAL_COMMUNITY): Payer: Self-pay | Admitting: Dentistry

## 2013-07-20 VITALS — BP 120/79 | HR 80 | Temp 98.6°F | Wt 235.0 lb

## 2013-07-20 DIAGNOSIS — Z9221 Personal history of antineoplastic chemotherapy: Secondary | ICD-10-CM

## 2013-07-20 DIAGNOSIS — Z923 Personal history of irradiation: Secondary | ICD-10-CM

## 2013-07-20 DIAGNOSIS — K08109 Complete loss of teeth, unspecified cause, unspecified class: Secondary | ICD-10-CM

## 2013-07-20 DIAGNOSIS — R1310 Dysphagia, unspecified: Secondary | ICD-10-CM

## 2013-07-20 DIAGNOSIS — K117 Disturbances of salivary secretion: Secondary | ICD-10-CM

## 2013-07-20 DIAGNOSIS — C099 Malignant neoplasm of tonsil, unspecified: Secondary | ICD-10-CM

## 2013-07-20 DIAGNOSIS — K000 Anodontia: Secondary | ICD-10-CM

## 2013-07-20 DIAGNOSIS — C090 Malignant neoplasm of tonsillar fossa: Secondary | ICD-10-CM

## 2013-07-20 DIAGNOSIS — R682 Dry mouth, unspecified: Secondary | ICD-10-CM

## 2013-07-20 DIAGNOSIS — Z0189 Encounter for other specified special examinations: Secondary | ICD-10-CM

## 2013-07-20 DIAGNOSIS — Z09 Encounter for follow-up examination after completed treatment for conditions other than malignant neoplasm: Secondary | ICD-10-CM

## 2013-07-20 DIAGNOSIS — R432 Parageusia: Secondary | ICD-10-CM

## 2013-07-20 NOTE — Progress Notes (Signed)
07/20/2013  Patient:            [name] Date of Birth:  May 22, 1950 MRN:                [id number]  BP 120/79  Pulse 80  Temp(Src) 98.6 F (37 C) (Oral)  Wt 235 lb (106.595 kg)  [name] presents for oral examination after radiation therapy. Patient has completed radiation treatments from 05/02/13 thru 06/22/13. The patient was only able to complete four weekly chemotherapy treatments secondary to side effects.  REVIEW OF CHIEF COMPLAINTS:  DRY MOUTH: Yes HARD TO SWALLOW: Yes that time  HURT TO SWALLOW: No TASTE CHANGES: Taste is returning SORES IN MOUTH: No TRISMUS: No problems with trismus WEIGHT: 235 pounds  HOME OH REGIMEN:  BRUSHING: Patient brushes his tongue daily. FLOSSING: Not applicable RINSING: Using salt water and baking soda rinses and Biotene rinses FLUORIDE: Not applicable TRISMUS EXERCISES:  Maximum interincisal opening: 53 mm  DENTAL EXAM:  Oral Hygiene:(PLAQUE): Edentulous. 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 We'll have Dr. Alvy Bimler reevaluate at her visit. DX: Xerostomia, Dysgeusia, Dysphagia and Edentulous  RECOMMENDATIONS: 1. Brush tongue daily. Rinse after meals and at bedtime. 2. Use trismus exercises as directed. 3. Use Biotene Rinse or salt water/baking soda rinses. 4. Multiple sips of water as needed. 5. Return to clinic in two months for start of upper lower complete denture fabrication. Quote provided.  Call if problems before then.  Lenn Cal, DDS

## 2013-07-20 NOTE — Telephone Encounter (Signed)
Message copied by Cathlean Cower on Wed Jul 20, 2013  1:10 PM ------      Message from: [facility], [place]: Wed Jul 20, 2013 12:01 PM       Patient did not show up in his last appt      Can you please call and see if he can reschedule to 7/17?      Let me know what is the best time for his transport and I will work him in ------

## 2013-07-20 NOTE — Patient Instructions (Addendum)
RECOMMENDATIONS: 1. Brush tongue daily. Rinse after meals and at bedtime. 2. Use trismus exercises as directed. 3. Use Biotene Rinse or salt water/baking soda rinses. 4. Multiple sips of water as needed. 5. Return to clinic in two months for start of upper lower complete denture fabrication. Quote provided.  Call if problems before then.  Lenn Cal, DDS

## 2013-07-20 NOTE — Telephone Encounter (Signed)
Gave pt with md for July 2015, FTKA last week

## 2013-07-20 NOTE — Telephone Encounter (Signed)
Pt called w/ new appt by Scheduler.

## 2013-07-29 ENCOUNTER — Encounter: Payer: Self-pay | Admitting: *Deleted

## 2013-07-29 ENCOUNTER — Ambulatory Visit (HOSPITAL_BASED_OUTPATIENT_CLINIC_OR_DEPARTMENT_OTHER): Payer: 59 | Admitting: Hematology and Oncology

## 2013-07-29 ENCOUNTER — Telehealth: Payer: Self-pay | Admitting: Hematology and Oncology

## 2013-07-29 VITALS — BP 137/81 | HR 90 | Temp 97.8°F | Resp 18 | Ht 74.0 in | Wt 225.4 lb

## 2013-07-29 DIAGNOSIS — C099 Malignant neoplasm of tonsil, unspecified: Secondary | ICD-10-CM

## 2013-07-29 DIAGNOSIS — R21 Rash and other nonspecific skin eruption: Secondary | ICD-10-CM

## 2013-07-29 DIAGNOSIS — E43 Unspecified severe protein-calorie malnutrition: Secondary | ICD-10-CM

## 2013-07-29 DIAGNOSIS — K1231 Oral mucositis (ulcerative) due to antineoplastic therapy: Secondary | ICD-10-CM

## 2013-07-29 DIAGNOSIS — E1140 Type 2 diabetes mellitus with diabetic neuropathy, unspecified: Secondary | ICD-10-CM

## 2013-07-29 DIAGNOSIS — E1149 Type 2 diabetes mellitus with other diabetic neurological complication: Secondary | ICD-10-CM

## 2013-07-29 DIAGNOSIS — E1142 Type 2 diabetes mellitus with diabetic polyneuropathy: Secondary | ICD-10-CM

## 2013-07-29 MED ORDER — FENTANYL 25 MCG/HR TD PT72: 25.0000 ug | MEDICATED_PATCH | TRANSDERMAL | Status: DC

## 2013-07-29 NOTE — Telephone Encounter (Signed)
gv and printed appt sched and avs for pt for Aug °

## 2013-07-30 NOTE — Assessment & Plan Note (Signed)
His skin is healing well. I told him to discontinue topical emollient cream and clindamycin gel and just observe for now.

## 2013-07-30 NOTE — Progress Notes (Signed)
To provide support, encouragement and care continuity,  met with [name] and his wife [name] during est pt 30 appt with Dr. Alvy Bimler.  Patient noted he is eating/drinking well, despite lack of dentures at this time, not experiencing pain.  They denied any concerns/needs at this time.  I encouraged them to contact me is that changes, they verbalized understanding.  Continuing to navigate as L3 (treatments completed) patient.  Gayleen Orem, RN, BSN, [facility] Head & Neck Oncology Navigator [phone]

## 2013-07-30 NOTE — Assessment & Plan Note (Signed)
His blood sugar is increasing now that he is eating regular meals. I told him to resume metformin once a day and to monitor his blood sugar regularly.

## 2013-07-30 NOTE — Progress Notes (Signed)
[place] OFFICE PROGRESS NOTE  Patient Care Team: Halford Chessman, MD as PCP - General (Family Medicine) Brooks Sailors, RN as Registered Nurse (Oncology) Heath Lark, MD as Consulting Physician (Hematology and Oncology) Eppie Gibson, MD as Attending Physician (Radiation Oncology)  SUMMARY OF ONCOLOGIC HISTORY: Oncology History   Tonsil cancer, HPV positive   Primary site: Pharynx - Oropharynx (Right)   Staging method: AJCC 7th Edition   Clinical free text: HPV positive   Clinical: Stage IVA (T3, N2c, M0) signed by Heath Lark, MD on 04/20/2013  9:29 PM   Summary: Stage IVA (T3, N2c, M0)       Tonsil cancer   02/01/2013 Imaging Ultrasound of the neck revealed bilateral lymphadenopathy in the submandibular region   03/16/2013 Imaging CT scan of the neck show large right tonsil mass measured 3.9 cm in maximum dimension as well as bilateral lymphadenopathy, the largest lymph node measures 31 mm. There is also a left thyroid mass measured 44 mm   03/21/2013 Procedure The patient was seen by ENT with laryngoscopy and biopsy. Pathology is pending   03/25/2013 Imaging PET scan showed large hypermetabolic soft tissue mass in the region of the right palatine tonsil with bilateral cervical hypermetabolic lymphadenopathy indicative of metastatic disease,   04/04/2013 Surgery He underwent placement of Port-A-Cath and feeding tube   04/14/2013 Procedure The patient underwent teeth extraction.   05/02/2013 - 06/13/2013 Chemotherapy The dosage of chemotherapy was interrupted many times due to side effects. His treatment is terminated early due to severe ulcerated skin toxicity.   05/02/2013 - 06/22/2013 Radiation Therapy The patient completed radiation treatment.    INTERVAL HISTORY: Please see below for problem oriented charting. He missed his last appointment due to transportation issues. Since then, he has improved significantly. He denies pain and is able to swallow food regularly. The  skin ulceration has resolved. Overall he is feeling well. He denies recent nausea or vomiting.  REVIEW OF SYSTEMS:   Constitutional: Denies fevers, chills or abnormal weight loss Eyes: Denies blurriness of vision Ears, nose, mouth, throat, and face: Denies mucositis or sore throat Respiratory: Denies cough, dyspnea or wheezes Cardiovascular: Denies palpitation, chest discomfort or lower extremity swelling Gastrointestinal:  Denies nausea, heartburn or change in bowel habits Skin: Denies abnormal skin rashes Lymphatics: Denies new lymphadenopathy or easy bruising Neurological:Denies numbness, tingling or new weaknesses Behavioral/Psych: Mood is stable, no new changes  All other systems were reviewed with the patient and are negative.  I have reviewed the past medical history, past surgical history, social history and family history with the patient and they are unchanged from previous note.  ALLERGIES:  is allergic to neurontin and dilaudid.  MEDICATIONS:  Current Outpatient Prescriptions  Medication Sig Dispense Refill  . acetaminophen (TYLENOL) 500 MG tablet Take 500-1,000 mg by mouth every 4 (four) hours as needed for mild pain.       [name] alprazolam (XANAX) 2 MG tablet Take 2 mg by mouth 3 (three) times daily as needed for sleep or anxiety.       . Alum & Mag Hydroxide-Simeth (MAGIC MOUTHWASH W/LIDOCAINE) SOLN 1part nystatin,1part Maaloxplus,1part benadryl,3part 2%viscous lidocaine. Swish/swallow 10 mL up to QID, 30min before meals/bedtime  480 mL  5  . Cholecalciferol (VITAMIN D3) 5000 UNITS CAPS Take 5,000 Units by mouth daily.      . citalopram (CELEXA) 20 MG tablet Take 20 mg by mouth daily.      . clindamycin (CLINDAGEL) 1 % gel Apply topically 2 (  two) times daily. To affected area.  60 g  1  . diphenhydrAMINE (BENADRYL) 25 mg capsule Take 1 capsule (25 mg total) by mouth every 6 (six) hours as needed for itching.  60 capsule  0  . docusate sodium (COLACE) 100 MG capsule Take 100  mg by mouth 2 (two) times daily as needed for mild constipation.      [name] emollient (BIAFINE) cream Apply topically 2 (two) times daily. Apply.t after radiation treatent and bedtime.      . fentaNYL (DURAGESIC - DOSED MCG/HR) 75 MCG/HR Place 1 patch (75 mcg total) onto the skin every 3 (three) days.  5 patch  0  . hydrocortisone cream 1 % Apply 1 application topically 2 (two) times daily.  30 g  0  . lidocaine-prilocaine (EMLA) cream Apply 1 application topically as needed.  30 g  0  . morphine (ROXANOL) 20 MG/ML concentrated solution Take 0.5 mLs (10 mg total) by mouth every 2 (two) hours as needed for severe pain.  240 mL  0  . ondansetron (ZOFRAN) 8 MG tablet Take 1 tablet (8 mg total) by mouth every 8 (eight) hours as needed for nausea.  60 tablet  3  . promethazine (PHENERGAN) 25 MG tablet Take 1 tablet (25 mg total) by mouth every 6 (six) hours as needed for nausea.  60 tablet  3  . silver sulfADIAZINE (SILVADENE) 1 % cream Apply 1 application topically 2 (two) times daily.      . sucralfate (CARAFATE) 1 G tablet dissolve 1 tablet in 10 mL H20 and swallow up to four times a day to soothe throat.  60 tablet  5  . fentaNYL (DURAGESIC - DOSED MCG/HR) 25 MCG/HR patch Place 1 patch (25 mcg total) onto the skin every 3 (three) days.  5 patch  0   No current facility-administered medications for this visit.    PHYSICAL EXAMINATION: ECOG PERFORMANCE STATUS: 1 - Symptomatic but completely ambulatory  Filed Vitals:   07/29/13 1436  BP: 137/81  Pulse: 90  Temp: 97.8 F (36.6 C)  Resp: 18   Filed Weights   07/29/13 1436  Weight: 225 lb 6.4 oz (102.241 kg)    GENERAL:alert, no distress and comfortable SKIN: skin color, texture, turgor are normal, no rashes or significant lesions. His prior radiation induced skin injury has healed EYES: normal, Conjunctiva are pink and non-injected, sclera clear OROPHARYNX:no exudate, no erythema and lips, buccal mucosa, and tongue normal . No thrush NECK:  supple, thyroid normal size, non-tender, without nodularity LYMPH:  no palpable lymphadenopathy in the cervical, axillary or inguinal LUNGS: clear to auscultation and percussion with normal breathing effort HEART: regular rate & rhythm and no murmurs and no lower extremity edema ABDOMEN:abdomen soft, non-tender and normal bowel sounds. Feeding tube site looks okay Musculoskeletal:no cyanosis of digits and no clubbing  NEURO: alert & oriented x 3 with fluent speech, no focal motor/sensory deficits  LABORATORY DATA:  I have reviewed the data as listed    Component Value Date/Time   NA 140 06/30/2013 0948   NA 141 04/04/2013 0732   K 4.1 06/30/2013 0948   K 3.9 04/04/2013 0732   CL 103 04/04/2013 0732   CO2 30* 06/30/2013 0948   CO2 25 04/04/2013 0732   GLUCOSE 157* 06/30/2013 0948   GLUCOSE 136* 04/04/2013 0732   BUN 8.7 06/30/2013 0948   BUN 12 04/04/2013 0732   CREATININE 0.7 06/30/2013 0948   CREATININE 0.72 04/04/2013 0732   CALCIUM 9.8  06/30/2013 0948   CALCIUM 9.3 04/04/2013 0732   PROT 8.4* 04/28/2013 1132   PROT 7.1 04/30/2010 0406   ALBUMIN 3.8 04/28/2013 1132   ALBUMIN 2.7* 04/30/2010 0406   AST 69* 04/28/2013 1132   AST 31 04/30/2010 0406   ALT 41 04/28/2013 1132   ALT 30 04/30/2010 0406   ALKPHOS 84 04/28/2013 1132   ALKPHOS 75 04/30/2010 0406   BILITOT 0.37 04/28/2013 1132   BILITOT 0.9 04/30/2010 0406   GFRNONAA >90 04/04/2013 0732   GFRAA >90 04/04/2013 0732    No results found for this basename: SPEP, UPEP,  kappa and lambda light chains    Lab Results  Component Value Date   WBC 6.2 06/30/2013   NEUTROABS 5.0 06/30/2013   HGB 13.0 06/30/2013   HCT 40.9 06/30/2013   MCV 86.3 06/30/2013   PLT 271 06/30/2013      Chemistry      Component Value Date/Time   NA 140 06/30/2013 0948   NA 141 04/04/2013 0732   K 4.1 06/30/2013 0948   K 3.9 04/04/2013 0732   CL 103 04/04/2013 0732   CO2 30* 06/30/2013 0948   CO2 25 04/04/2013 0732   BUN 8.7 06/30/2013 0948   BUN 12 04/04/2013 0732    CREATININE 0.7 06/30/2013 0948   CREATININE 0.72 04/04/2013 0732      Component Value Date/Time   CALCIUM 9.8 06/30/2013 0948   CALCIUM 9.3 04/04/2013 0732   ALKPHOS 84 04/28/2013 1132   ALKPHOS 75 04/30/2010 0406   AST 69* 04/28/2013 1132   AST 31 04/30/2010 0406   ALT 41 04/28/2013 1132   ALT 30 04/30/2010 0406   BILITOT 0.37 04/28/2013 1132   BILITOT 0.9 04/30/2010 0406      ASSESSMENT & PLAN:  Tonsil cancer He has completed all his treatment and has recovered nicely. I will recommend a fentanyl patch taper and see him back within the month for further review. I continue to encourage him to increase oral intake as tolerated. I have asked him to resume some of his old medications as outlined below.  Rash His skin is healing well. I told him to discontinue topical emollient cream and clindamycin gel and just observe for now.  Protein-calorie malnutrition, severe He has lost another 5 pounds since I saw him but overall he has started to eat well and is not using his feeding tube for the past week. I continue to encourage him to increase oral intake as tolerated. If the patient is able to maintain his weight without using feeding tube for at least 2 weeks, we will get his feeding tube removed.  Diabetes mellitus, type 2 His blood sugar is increasing now that he is eating regular meals. I told him to resume metformin once a day and to monitor his blood sugar regularly.  Mucositis (ulcerative) due to antineoplastic therapy He has no mucositis. I will taper off fentanyl patch quickly. He has liquid morphine to use as needed for breakthrough pain.  Diabetic neuropathy, painful I told the patient to resume taking Lyrica now that he can swallow his pills again.   No orders of the defined types were placed in this encounter.   All questions were answered. The patient knows to call the clinic with any problems, questions or concerns. No barriers to learning was detected. I spent 25  minutes counseling the patient face to face. The total time spent in the appointment was 30 minutes and more than 50% was on counseling  and review of test results     [facility], [place], MD 07/30/2013 8:07 PM

## 2013-07-30 NOTE — Assessment & Plan Note (Signed)
He has completed all his treatment and has recovered nicely. I will recommend a fentanyl patch taper and see him back within the month for further review. I continue to encourage him to increase oral intake as tolerated. I have asked him to resume some of his old medications as outlined below.

## 2013-07-30 NOTE — Assessment & Plan Note (Signed)
I told the patient to resume taking Lyrica now that he can swallow his pills again.

## 2013-07-30 NOTE — Assessment & Plan Note (Signed)
He has lost another 5 pounds since I saw him but overall he has started to eat well and is not using his feeding tube for the past week. I continue to encourage him to increase oral intake as tolerated. If the patient is able to maintain his weight without using feeding tube for at least 2 weeks, we will get his feeding tube removed.

## 2013-07-30 NOTE — Assessment & Plan Note (Signed)
He has no mucositis. I will taper off fentanyl patch quickly. He has liquid morphine to use as needed for breakthrough pain.

## 2013-08-02 ENCOUNTER — Telehealth: Payer: Self-pay | Admitting: *Deleted

## 2013-08-03 NOTE — Telephone Encounter (Signed)
Pt's wife called on patient's behalf to ask if OK for patient to drive now that tmts are completed.  I checked with Dr. Alvy Bimler who replied that best for him not to drive while still using Fentanyl patch. I relayed information to her; she verbalized understanding.  Gayleen Orem, RN, BSN, [facility] Head & Neck Oncology Navigator [phone]

## 2013-08-17 ENCOUNTER — Ambulatory Visit
Admission: RE | Admit: 2013-08-17 | Discharge: 2013-08-17 | Disposition: A | Discharge status: Home / Self Care | Payer: 59 | Source: Ambulatory Visit | Attending: Radiation Oncology | Admitting: Radiation Oncology

## 2013-08-17 ENCOUNTER — Encounter: Payer: Self-pay | Admitting: Radiation Oncology

## 2013-08-17 VITALS — BP 116/77 | HR 74 | Temp 98.3°F | Ht 74.0 in | Wt 219.8 lb

## 2013-08-17 DIAGNOSIS — C099 Malignant neoplasm of tonsil, unspecified: Secondary | ICD-10-CM

## 2013-08-17 NOTE — Progress Notes (Signed)
MR [name] here today for assessment s/p radiation to his Pharynx/Oropharynx.  He denies any pain nor difficulty swallowing, but unable to bolus breads if not moist.  Oral mucosa, pink, moist and intact.  He has Lost ~ 6 lbs since last weight.  Skin on neck with hyperpigmentation with a pink appearance.  Discussed need for protein in diet.

## 2013-08-17 NOTE — Progress Notes (Signed)
Radiation Oncology         [phone] ________________________________  Name: Arwin Bisceglia Brazell MRN: [id number]  Date: 08/17/2013  DOB: February 24, 1950  Follow-Up Visit Note  CC: Purvis Kilts, MD  Heath Lark, MD  Diagnosis and Prior Radiotherapy:   Squamous Cell Carcinoma of the right tonsil, p16+, T3N2cM0, Stage IVA  Indication for treatment: curative  Radiation treatment dates: 05/02/2013-06/22/2013  Site/dose: Right tonsil and bilateral neck / 70 Gy in 35 fractions to gross disease, 63 Gy in 35 fractions to high risk nodal echelons, and 56 Gy in 35 fractions to intermediate risk nodal echelons  Narrative:  The patient returns today for routine follow-up. He denies any pain nor difficulty swallowing, but unable to bolus breads if not moist. Oral mucosa, pink, moist and intact. He has Lost ~ 6 lbs since last weight. But, he ate a while rotisserie chicken last night.  Skin on neck with hyperpigmentation with a pink appearance. He and his wife acknowledge emotional stress related to family issues and his health.  His wife remains interested in counseling but didn't return social work's call in June and didn't recall getting a VM message.                                ALLERGIES:  is allergic to neurontin and dilaudid.  Meds: Current Outpatient Prescriptions  Medication Sig Dispense Refill  . acetaminophen (TYLENOL) 500 MG tablet Take 500-1,000 mg by mouth every 4 (four) hours as needed for mild pain.       [name] alprazolam (XANAX) 2 MG tablet Take 2 mg by mouth 3 (three) times daily as needed for sleep or anxiety.       . Cholecalciferol (VITAMIN D3) 5000 UNITS CAPS Take 5,000 Units by mouth daily.      . citalopram (CELEXA) 20 MG tablet Take 20 mg by mouth daily.      . diphenhydrAMINE (BENADRYL) 25 mg capsule Take 1 capsule (25 mg total) by mouth every 6 (six) hours as needed for itching.  60 capsule  0  . docusate sodium (COLACE) 100 MG capsule Take 100 mg by mouth 2 (two) times daily as  needed for mild constipation.      . fentaNYL (DURAGESIC - DOSED MCG/HR) 25 MCG/HR patch Place 1 patch (25 mcg total) onto the skin every 3 (three) days.  5 patch  0  . hydrocortisone cream 1 % Apply 1 application topically 2 (two) times daily.  30 g  0  . lidocaine-prilocaine (EMLA) cream Apply 1 application topically as needed.  30 g  0  . morphine (ROXANOL) 20 MG/ML concentrated solution Take 0.5 mLs (10 mg total) by mouth every 2 (two) hours as needed for severe pain.  240 mL  0  . ondansetron (ZOFRAN) 8 MG tablet Take 1 tablet (8 mg total) by mouth every 8 (eight) hours as needed for nausea.  60 tablet  3  . promethazine (PHENERGAN) 25 MG tablet Take 1 tablet (25 mg total) by mouth every 6 (six) hours as needed for nausea.  60 tablet  3   No current facility-administered medications for this encounter.    Physical Findings: The patient is in no acute distress. Patient is alert and oriented.  height is 6\' 2"  (1.88 m) and weight is 219 lb 12.8 oz (99.701 kg). His temperature is 98.3 F (36.8 C). His blood pressure is 116/77 and his pulse is 74.  His oxygen saturation is 99%. .  Oropharyngeal mucosa is intact with no thrush or lesions. No tumor seen. No palpable cervical or supraclavicular lymphadenopathy. Skin intact and smooth over neck.     Lab Findings: Lab Results  Component Value Date   WBC 6.2 06/30/2013   HGB 13.0 06/30/2013   HCT 40.9 06/30/2013   MCV 86.3 06/30/2013   PLT 271 06/30/2013    Lab Results  Component Value Date   TSH 0.780 03/29/2013    Radiographic Findings: No results found.  Impression/Plan:   1) Head and Neck Cancer Status: NED on physical exam.  2) Nutritional Status:  - weight: slight decrease  - PEG tube: not using. May consider removal when weight stabilizes.  3) Risk Factors: The patient has been educated about risk factors including alcohol and tobacco abuse; they understand that avoidance of alcohol and tobacco is important to prevent  recurrences as well as other cancers   4) Swallowing: functional with most foods  5) Dental: edentulous   6) Energy: improving, follow TSH in future - check in 2 mo.  7) Social: Gave wife Ander Purpura Mullis's number to call for counseling. Pt remains uninterested in counseling. Emotional support, empathy provided today.  8)  Follow-up in 2 months with PET and TSH. The patient was encouraged to call with any issues or questions before then.  ___________________________________      Eppie Gibson, MD

## 2013-08-18 ENCOUNTER — Encounter: Payer: Self-pay | Admitting: *Deleted

## 2013-08-18 ENCOUNTER — Telehealth: Payer: Self-pay | Admitting: *Deleted

## 2013-08-18 NOTE — Progress Notes (Signed)
[place] Work  Clinical Social Work was referred by Pension scheme manager to provide support to patient's spouse.  Clinical Social Worker contacted patient's spouse, [name], by phone to offer support and assess for needs.  Ms. Moan was very appreciative of CSW support.  She shared she has attempted to "be strong" for her spouse and tried not to show emotion.  CSW discussed importance of releasing and processing emotions.  Ms. Drumwright also shared family conflicts that are causing her anxiety.  Paitent's spouse was very tearful during phone conversation.  Ms. Masso was agreeable to counseling at cancer center through our [facility] counseling interns- CSW made referral- counseling interns will contact Ms. Marcellus when they begin in September.  Ms. Knightly identified her pastor and a close friend as positive supports she can utilize while waiting to begin counseling.  CSW encouraged patient to contact her with any additional concerns or questions.    Polo Riley, MSW, LCSW, OSW-C Clinical Social Worker [facility] [phone]

## 2013-08-18 NOTE — Telephone Encounter (Signed)
CALLED PATIENT TO INFORM OF TEST , LAB AND FU, LVM FOR A RETURN CALL

## 2013-09-02 ENCOUNTER — Ambulatory Visit (HOSPITAL_BASED_OUTPATIENT_CLINIC_OR_DEPARTMENT_OTHER): Payer: 59 | Admitting: Hematology and Oncology

## 2013-09-02 ENCOUNTER — Telehealth: Payer: Self-pay | Admitting: Hematology and Oncology

## 2013-09-02 ENCOUNTER — Encounter: Payer: Self-pay | Admitting: Hematology and Oncology

## 2013-09-02 ENCOUNTER — Encounter: Payer: Self-pay | Admitting: *Deleted

## 2013-09-02 VITALS — BP 116/75 | HR 71 | Temp 98.4°F | Resp 18 | Ht 74.0 in | Wt 217.5 lb

## 2013-09-02 DIAGNOSIS — E1142 Type 2 diabetes mellitus with diabetic polyneuropathy: Secondary | ICD-10-CM

## 2013-09-02 DIAGNOSIS — C099 Malignant neoplasm of tonsil, unspecified: Secondary | ICD-10-CM

## 2013-09-02 DIAGNOSIS — E1140 Type 2 diabetes mellitus with diabetic neuropathy, unspecified: Secondary | ICD-10-CM

## 2013-09-02 DIAGNOSIS — Z931 Gastrostomy status: Secondary | ICD-10-CM

## 2013-09-02 DIAGNOSIS — E1149 Type 2 diabetes mellitus with other diabetic neurological complication: Secondary | ICD-10-CM

## 2013-09-02 MED ORDER — PREGABALIN 150 MG PO CAPS: 300.0000 mg | ORAL_CAPSULE | Freq: Two times a day (BID) | ORAL | Status: DC

## 2013-09-02 MED ORDER — PREGABALIN 300 MG PO CAPS: 300.0000 mg | ORAL_CAPSULE | Freq: Two times a day (BID) | ORAL | Status: DC

## 2013-09-02 NOTE — Progress Notes (Signed)
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 There is also a left thyroid mass measured 44 mm   03/21/2013 Procedure The patient was seen by ENT with laryngoscopy and biopsy. Pathology is pending   03/25/2013 Imaging PET scan showed large hypermetabolic soft tissue mass in the region of the right palatine tonsil with bilateral cervical hypermetabolic lymphadenopathy indicative of metastatic disease,   04/04/2013 Surgery He underwent placement of Port-A-Cath and feeding tube   04/14/2013 Procedure The patient underwent teeth extraction.   05/02/2013 - 06/13/2013 Chemotherapy The dosage of chemotherapy was interrupted many times due to side effects. His treatment is terminated early due to severe ulcerated skin toxicity.   05/02/2013 - 06/22/2013 Radiation Therapy The patient completed radiation treatment.    INTERVAL HISTORY: Please see below for problem oriented charting. He returns today for his monthly toxicity review. He is eating for 100% by mouth. He has successfully taper off of all his pain medications. He remained on  medications for peripheral neuropathy. His diabetes but appears to be well controlled.  REVIEW OF SYSTEMS:   Constitutional: Denies fevers, chills or abnormal weight loss Eyes: Denies blurriness of vision Ears, nose, mouth, throat, and face: Denies mucositis or sore throat Respiratory: Denies cough, dyspnea or wheezes Cardiovascular: Denies palpitation, chest discomfort or lower extremity swelling Gastrointestinal:  Denies nausea, heartburn or change in bowel habits Skin: Denies abnormal skin rashes Lymphatics: Denies new lymphadenopathy or easy bruising Neurological:Denies numbness, tingling or new weaknesses Behavioral/Psych: Mood is stable, no new changes  All other systems were reviewed with the patient and are negative.  I have reviewed the past medical history, past surgical history, social history and family history with the patient and they are unchanged from previous note.  ALLERGIES:  is allergic to neurontin and dilaudid.  MEDICATIONS:  Current Outpatient Prescriptions  Medication Sig Dispense Refill  . acetaminophen (TYLENOL) 500 MG tablet Take 500-1,000 mg by mouth every 4 (four) hours as needed for mild pain.       [name] alprazolam (XANAX) 2 MG tablet Take 2 mg by mouth 3 (three) times daily as needed for sleep or anxiety.       . Cholecalciferol (VITAMIN D3) 5000 UNITS CAPS Take 5,000 Units by mouth daily.      . citalopram (CELEXA) 20 MG tablet Take 20 mg by mouth daily.      [name] docusate sodium (COLACE) 100 MG capsule Take 100 mg by mouth 2 (two) times daily as needed for mild constipation.      . fentaNYL (DURAGESIC - DOSED MCG/HR) 25 MCG/HR patch Place 1 patch (25 mcg total)  onto the skin every 3 (three) days.  5 patch  0  . diphenhydrAMINE (BENADRYL) 25 mg capsule Take 1 capsule (25 mg total) by mouth every 6 (six) hours as needed for itching.  60 capsule  0  . hydrocortisone cream 1 % Apply 1 application topically 2 (two) times daily.  30 g  0  . lidocaine-prilocaine  (EMLA) cream Apply 1 application topically as needed.  30 g  0  . morphine (ROXANOL) 20 MG/ML concentrated solution Take 0.5 mLs (10 mg total) by mouth every 2 (two) hours as needed for severe pain.  240 mL  0  . ondansetron (ZOFRAN) 8 MG tablet Take 1 tablet (8 mg total) by mouth every 8 (eight) hours as needed for nausea.  60 tablet  3  . pregabalin (LYRICA) 150 MG capsule Take 2 capsules (300 mg total) by mouth 2 (two) times daily.  60 capsule  3  . pregabalin (LYRICA) 300 MG capsule Take 1 capsule (300 mg total) by mouth 2 (two) times daily.  60 capsule  3  . promethazine (PHENERGAN) 25 MG tablet Take 1 tablet (25 mg total) by mouth every 6 (six) hours as needed for nausea.  60 tablet  3   No current facility-administered medications for this visit.    PHYSICAL EXAMINATION: ECOG PERFORMANCE STATUS: 0 - Asymptomatic  Filed Vitals:   09/02/13 1426  BP: 116/75  Pulse: 71  Temp: 98.4 F (36.9 C)  Resp: 18   Filed Weights   09/02/13 1426  Weight: 217 lb 8 oz (98.657 kg)    GENERAL:alert, no distress and comfortable SKIN: skin color, texture, turgor are normal, no rashes or significant lesions. His skin has healed from recent radiation dermatitis. EYES: normal, Conjunctiva are pink and non-injected, sclera clear OROPHARYNX:no exudate, no erythema and lips, buccal mucosa, and tongue normal  NECK: supple, thyroid normal size, non-tender, without nodularity LYMPH:  no palpable lymphadenopathy in the cervical, axillary or inguinal LUNGS: clear to auscultation and percussion with normal breathing effort HEART: regular rate & rhythm and no murmurs and no lower extremity edema ABDOMEN:abdomen soft, non-tender and normal bowel sounds. Feeding tube site looks okay Musculoskeletal:no cyanosis of digits and no clubbing  NEURO: alert & oriented x 3 with fluent speech, no focal motor/sensory deficits  LABORATORY DATA:  I have reviewed the data as listed    Component Value Date/Time   NA  140 06/30/2013 0948   NA 141 04/04/2013 0732   K 4.1 06/30/2013 0948   K 3.9 04/04/2013 0732   CL 103 04/04/2013 0732   CO2 30* 06/30/2013 0948   CO2 25 04/04/2013 0732   GLUCOSE 157* 06/30/2013 0948   GLUCOSE 136* 04/04/2013 0732   BUN 8.7 06/30/2013 0948   BUN 12 04/04/2013 0732   CREATININE 0.7 06/30/2013 0948   CREATININE 0.72 04/04/2013 0732   CALCIUM 9.8 06/30/2013 0948   CALCIUM 9.3 04/04/2013 0732   PROT 8.4* 04/28/2013 1132   PROT 7.1 04/30/2010 0406   ALBUMIN 3.8 04/28/2013 1132   ALBUMIN 2.7* 04/30/2010 0406   AST 69* 04/28/2013 1132   AST 31 04/30/2010 0406   ALT 41 04/28/2013 1132   ALT 30 04/30/2010 0406   ALKPHOS 84 04/28/2013 1132   ALKPHOS 75 04/30/2010 0406   BILITOT 0.37 04/28/2013 1132   BILITOT 0.9 04/30/2010 0406   GFRNONAA >90 04/04/2013 0732   GFRAA >90 04/04/2013 0732    No results found for this basename: SPEP, UPEP,  kappa and lambda light  chains    Lab Results  Component Value Date   WBC 6.2 06/30/2013   NEUTROABS 5.0 06/30/2013   HGB 13.0 06/30/2013   HCT 40.9 06/30/2013   MCV 86.3 06/30/2013   PLT 271 06/30/2013      Chemistry      Component Value Date/Time   NA 140 06/30/2013 0948   NA 141 04/04/2013 0732   K 4.1 06/30/2013 0948   K 3.9 04/04/2013 0732   CL 103 04/04/2013 0732   CO2 30* 06/30/2013 0948   CO2 25 04/04/2013 0732   BUN 8.7 06/30/2013 0948   BUN 12 04/04/2013 0732   CREATININE 0.7 06/30/2013 0948   CREATININE 0.72 04/04/2013 0732      Component Value Date/Time   CALCIUM 9.8 06/30/2013 0948   CALCIUM 9.3 04/04/2013 0732   ALKPHOS 84 04/28/2013 1132   ALKPHOS 75 04/30/2010 0406   AST 69* 04/28/2013 1132   AST 31 04/30/2010 0406   ALT 41 04/28/2013 1132   ALT 30 04/30/2010 0406   BILITOT 0.37 04/28/2013 1132   BILITOT 0.9 04/30/2010 0406     ASSESSMENT & PLAN:  Tonsil cancer He has completed all his treatment and has recovered nicely. He had completed taper off all pain medicine. He is still taking lyrica for peripheral neuropathy. I refilled his  prescription. I will see him in 2 months to review PET CT scan result.   Gastrostomy in place He is able to tolerate 100% of oral intake without weight loss. I will refer him back to Gen. surgery to have his feeding tube removed but to keep the port until after results of PET scan.  Diabetic neuropathy, painful I refilled his prescription of lyrica   Orders Placed This Encounter  Procedures  . Comprehensive metabolic panel    Standing Status: Future     Number of Occurrences:      Standing Expiration Date: 10/07/2014  . CBC with Differential    Standing Status: Future     Number of Occurrences:      Standing Expiration Date: 10/07/2014  . Ambulatory referral to General Surgery    Referral Priority:  Routine    Referral Type:  Surgical    Referral Reason:  Specialty Services Required    Referred to Provider:  Ralene Ok, MD    Requested Specialty:  General Surgery    Number of Visits Requested:  1   All questions were answered. The patient knows to call the clinic with any problems, questions or concerns. No barriers to learning was detected. I spent 25 minutes counseling the patient face to face. The total time spent in the appointment was 30 minutes and more than 50% was on counseling and review of test results     [facility], [place], MD 09/02/2013 3:50 PM

## 2013-09-02 NOTE — Assessment & Plan Note (Signed)
He is able to tolerate 100% of oral intake without weight loss. I will refer him back to Gen. surgery to have his feeding tube removed but to keep the port until after results of PET scan.

## 2013-09-02 NOTE — Assessment & Plan Note (Signed)
He has completed all his treatment and has recovered nicely. He had completed taper off all pain medicine. He is still taking lyrica for peripheral neuropathy. I refilled his prescription. I will see him in 2 months to review PET CT scan result.

## 2013-09-02 NOTE — Telephone Encounter (Signed)
gv adn printed appt sched and avs fo rpt for Sept and OCT

## 2013-09-02 NOTE — Assessment & Plan Note (Signed)
I refilled his prescription of lyrica

## 2013-09-02 NOTE — Progress Notes (Signed)
Note type error.

## 2013-09-07 ENCOUNTER — Telehealth: Payer: Self-pay | Admitting: *Deleted

## 2013-09-07 NOTE — Telephone Encounter (Signed)
Called pt's wife in follow-up to her VM re: referral for PEG removal.  I informed that referral was made on 8/21 and clarification submitted 8/24 by Dr. Alvy Bimler.  I told her Lindel should receive a call from [place] in the next day or so, if not to give me a call.  Continuing to navigate as L3 (treatments completed) patient.  Gayleen Orem, RN, BSN, [place] at [place] [phone]

## 2013-09-16 ENCOUNTER — Telehealth: Payer: Self-pay | Admitting: *Deleted

## 2013-09-16 NOTE — Telephone Encounter (Signed)
Call relayed by Acquanetta Sit RN, that she received call from patient's wife that they have not heard from [facility] re: scheduling of feeding tube removal.  I called [facility], spoke with Kenney Houseman who said she wd relay message to [place], Dr. Ramierez's RN.  Heard back from [place] shortly thereafter, she indicated a 09/20/13 11:20 appt has been scheduled.  I indicated I would call patient with information.  Gayleen Orem, RN, BSN, [place] at [place] [phone]

## 2013-09-16 NOTE — Telephone Encounter (Signed)
Called patient, spoke with his wife.  Informed her of 09/20/13 1120 appt at [facility] for feeding tube removal.  She verbalized understanding.  Also LVM with dtr Levada Dy with same information.  Gayleen Orem, RN, BSN, [place] at [place] [phone]

## 2013-09-26 ENCOUNTER — Encounter (HOSPITAL_COMMUNITY): Payer: Self-pay | Admitting: Dentistry

## 2013-09-26 ENCOUNTER — Ambulatory Visit (HOSPITAL_COMMUNITY): Payer: Self-pay | Admitting: Dentistry

## 2013-09-26 VITALS — BP 105/66 | HR 86 | Temp 98.3°F

## 2013-09-26 DIAGNOSIS — K08109 Complete loss of teeth, unspecified cause, unspecified class: Secondary | ICD-10-CM

## 2013-09-26 DIAGNOSIS — K117 Disturbances of salivary secretion: Secondary | ICD-10-CM

## 2013-09-26 DIAGNOSIS — Z463 Encounter for fitting and adjustment of dental prosthetic device: Secondary | ICD-10-CM

## 2013-09-26 DIAGNOSIS — Z923 Personal history of irradiation: Secondary | ICD-10-CM

## 2013-09-26 DIAGNOSIS — K000 Anodontia: Principal | ICD-10-CM

## 2013-09-26 DIAGNOSIS — Z9221 Personal history of antineoplastic chemotherapy: Secondary | ICD-10-CM

## 2013-09-26 DIAGNOSIS — K0820 Unspecified atrophy of edentulous alveolar ridge: Secondary | ICD-10-CM

## 2013-09-26 DIAGNOSIS — R682 Dry mouth, unspecified: Secondary | ICD-10-CM

## 2013-09-26 NOTE — Patient Instructions (Signed)
Return to clinic as scheduled for continued fabrication of upper and lower complete dentures. Dr. Enrique Sack

## 2013-09-26 NOTE — Progress Notes (Signed)
09/26/2013  Patient:            [name] Date of Birth:  28-Oct-1950 MRN:                [id number]  BP 105/66  Pulse 86  Temp(Src) 98.3 F (36.8 C) (Oral)   [name] presents for start of upper and lower denture fabrication. Exam: Patient is edentulous. 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 There is atrophy of the edentulous alveolar ridges. Discussed procedures involved in upper and lower denture fabrication and prognosis for successful ability to wear dentures. Price for dentures confirmed.  Patient agrees to proceed with upper and lower denture fabrication. Procedure:  Upper and lower denture primary impressions in alginate. Lab pour. To [facility] for upper and lower denture custom tray fabrication. RTC for upper and lower denture final impressions.  Lenn Cal, DDS

## 2013-10-11 ENCOUNTER — Encounter (HOSPITAL_COMMUNITY): Payer: Self-pay | Admitting: Dentistry

## 2013-10-11 ENCOUNTER — Ambulatory Visit (HOSPITAL_COMMUNITY): Payer: Self-pay | Admitting: Dentistry

## 2013-10-11 VITALS — BP 111/71 | HR 71 | Temp 98.5°F

## 2013-10-11 DIAGNOSIS — K08109 Complete loss of teeth, unspecified cause, unspecified class: Secondary | ICD-10-CM

## 2013-10-11 DIAGNOSIS — Z923 Personal history of irradiation: Secondary | ICD-10-CM

## 2013-10-11 DIAGNOSIS — K000 Anodontia: Principal | ICD-10-CM

## 2013-10-11 DIAGNOSIS — Z463 Encounter for fitting and adjustment of dental prosthetic device: Secondary | ICD-10-CM

## 2013-10-11 DIAGNOSIS — K117 Disturbances of salivary secretion: Secondary | ICD-10-CM

## 2013-10-11 DIAGNOSIS — Z9221 Personal history of antineoplastic chemotherapy: Secondary | ICD-10-CM

## 2013-10-11 DIAGNOSIS — K0820 Unspecified atrophy of edentulous alveolar ridge: Secondary | ICD-10-CM

## 2013-10-11 DIAGNOSIS — R682 Dry mouth, unspecified: Secondary | ICD-10-CM

## 2013-10-11 NOTE — Progress Notes (Signed)
10/11/2013  Patient:            [name] Date of Birth:  16-Dec-1950 MRN:                [id number]  BP 111/71  Pulse 71  Temp(Src) 98.5 F (36.9 C) (Oral)  [name] presents for continued upper and lower complete denture fabrication. Procedure:  Upper and lower border molding and final impressions in Aquasil. Patient tolerated procedure well. To [facility] for custom baseplates with rims. Return to clinic for upper and lower complete denture jaw relations.  Lenn Cal, DDS

## 2013-10-11 NOTE — Patient Instructions (Signed)
Return to clinic as scheduled for continued upper lower complete denture fabrication. Dr. Enrique Sack

## 2013-10-18 ENCOUNTER — Ambulatory Visit (HOSPITAL_BASED_OUTPATIENT_CLINIC_OR_DEPARTMENT_OTHER): Payer: 59

## 2013-10-18 ENCOUNTER — Encounter (HOSPITAL_COMMUNITY): Payer: Self-pay

## 2013-10-18 ENCOUNTER — Other Ambulatory Visit: Payer: Self-pay

## 2013-10-18 ENCOUNTER — Ambulatory Visit
Admission: RE | Admit: 2013-10-18 | Discharge: 2013-10-18 | Disposition: A | Discharge status: Home / Self Care | Payer: 59 | Source: Ambulatory Visit | Attending: Radiation Oncology | Admitting: Radiation Oncology

## 2013-10-18 ENCOUNTER — Ambulatory Visit (HOSPITAL_COMMUNITY)
Admission: RE | Admit: 2013-10-18 | Discharge: 2013-10-18 | Disposition: A | Discharge status: Home / Self Care | Payer: 59 | Source: Ambulatory Visit | Attending: Radiation Oncology | Admitting: Radiation Oncology

## 2013-10-18 VITALS — BP 114/60 | HR 63 | Temp 97.9°F | Resp 20

## 2013-10-18 DIAGNOSIS — C099 Malignant neoplasm of tonsil, unspecified: Secondary | ICD-10-CM

## 2013-10-18 DIAGNOSIS — Z95828 Presence of other vascular implants and grafts: Secondary | ICD-10-CM

## 2013-10-18 DIAGNOSIS — Z452 Encounter for adjustment and management of vascular access device: Secondary | ICD-10-CM

## 2013-10-18 LAB — COMPREHENSIVE METABOLIC PANEL (CC13)
ALBUMIN: 3.6 g/dL (ref 3.5–5.0)
ALT: 10 U/L (ref 0–55)
ANION GAP: 7 meq/L (ref 3–11)
AST: 18 U/L (ref 5–34)
Alkaline Phosphatase: 81 U/L (ref 40–150)
BUN: 11.5 mg/dL (ref 7.0–26.0)
CO2: 29 meq/L (ref 22–29)
Calcium: 9.7 mg/dL (ref 8.4–10.4)
Chloride: 107 mEq/L (ref 98–109)
Creatinine: 0.7 mg/dL (ref 0.7–1.3)
Glucose: 98 mg/dl (ref 70–140)
POTASSIUM: 3.9 meq/L (ref 3.5–5.1)
SODIUM: 143 meq/L (ref 136–145)
TOTAL PROTEIN: 8 g/dL (ref 6.4–8.3)
Total Bilirubin: 0.5 mg/dL (ref 0.20–1.20)

## 2013-10-18 LAB — TSH CHCC: TSH: 0.413 m[IU]/L (ref 0.320–4.118)

## 2013-10-18 LAB — CBC WITH DIFFERENTIAL/PLATELET
BASO%: 0.9 % (ref 0.0–2.0)
Basophils Absolute: 0 10*3/uL (ref 0.0–0.1)
EOS%: 4.1 % (ref 0.0–7.0)
Eosinophils Absolute: 0.2 10*3/uL (ref 0.0–0.5)
HCT: 42 % (ref 38.4–49.9)
HGB: 13.6 g/dL (ref 13.0–17.1)
LYMPH#: 0.4 10*3/uL — AB (ref 0.9–3.3)
LYMPH%: 7 % — AB (ref 14.0–49.0)
MCH: 28.1 pg (ref 27.2–33.4)
MCHC: 32.4 g/dL (ref 32.0–36.0)
MCV: 86.8 fL (ref 79.3–98.0)
MONO#: 0.5 10*3/uL (ref 0.1–0.9)
MONO%: 9.1 % (ref 0.0–14.0)
NEUT#: 4.3 10*3/uL (ref 1.5–6.5)
NEUT%: 78.9 % — ABNORMAL HIGH (ref 39.0–75.0)
Platelets: 140 10*3/uL (ref 140–400)
RBC: 4.84 10*6/uL (ref 4.20–5.82)
RDW: 16.6 % — ABNORMAL HIGH (ref 11.0–14.6)
WBC: 5.5 10*3/uL (ref 4.0–10.3)

## 2013-10-18 LAB — GLUCOSE, CAPILLARY: Glucose-Capillary: 105 mg/dL — ABNORMAL HIGH (ref 70–99)

## 2013-10-18 MED ORDER — FLUDEOXYGLUCOSE F - 18 (FDG) INJECTION
10.6000 | Freq: Once | INTRAVENOUS | Status: AC | PRN
  Administered 2013-10-18: 10.6 via INTRAVENOUS

## 2013-10-18 MED ORDER — SODIUM CHLORIDE 0.9 % IJ SOLN
10.0000 mL | INTRAMUSCULAR | Status: DC | PRN
  Administered 2013-10-18: 10 mL via INTRAVENOUS
  Filled 2013-10-18: qty 10

## 2013-10-18 MED ORDER — HEPARIN SOD (PORK) LOCK FLUSH 100 UNIT/ML IV SOLN
500.0000 [IU] | Freq: Once | INTRAVENOUS | Status: AC
  Administered 2013-10-18: 500 [IU] via INTRAVENOUS
  Filled 2013-10-18: qty 5

## 2013-10-18 NOTE — Patient Instructions (Signed)

## 2013-10-19 ENCOUNTER — Telehealth: Payer: Self-pay | Admitting: Hematology and Oncology

## 2013-10-19 ENCOUNTER — Ambulatory Visit
Admission: RE | Admit: 2013-10-19 | Discharge: 2013-10-19 | Disposition: A | Discharge status: Home / Self Care | Payer: 59 | Source: Ambulatory Visit | Attending: Radiation Oncology | Admitting: Radiation Oncology

## 2013-10-19 ENCOUNTER — Encounter (HOSPITAL_COMMUNITY): Payer: Self-pay | Admitting: Dentistry

## 2013-10-19 ENCOUNTER — Encounter: Payer: Self-pay | Admitting: Hematology and Oncology

## 2013-10-19 ENCOUNTER — Ambulatory Visit (HOSPITAL_BASED_OUTPATIENT_CLINIC_OR_DEPARTMENT_OTHER): Payer: 59 | Admitting: Hematology and Oncology

## 2013-10-19 ENCOUNTER — Encounter: Payer: Self-pay | Admitting: Radiation Oncology

## 2013-10-19 ENCOUNTER — Encounter: Payer: Self-pay | Admitting: *Deleted

## 2013-10-19 VITALS — BP 114/69 | HR 78 | Temp 97.1°F | Resp 18 | Ht 74.0 in | Wt 212.9 lb

## 2013-10-19 VITALS — BP 139/79 | HR 72 | Ht 74.0 in | Wt 213.1 lb

## 2013-10-19 DIAGNOSIS — C7800 Secondary malignant neoplasm of unspecified lung: Secondary | ICD-10-CM | POA: Insufficient documentation

## 2013-10-19 DIAGNOSIS — C099 Malignant neoplasm of tonsil, unspecified: Secondary | ICD-10-CM

## 2013-10-19 DIAGNOSIS — C7801 Secondary malignant neoplasm of right lung: Secondary | ICD-10-CM

## 2013-10-19 DIAGNOSIS — C7802 Secondary malignant neoplasm of left lung: Secondary | ICD-10-CM

## 2013-10-19 NOTE — Progress Notes (Signed)
Radiation Oncology         ([phone]) [phone] ________________________________  Name: [name] MRN: [id number]  Date: 10/19/2013  DOB: [birth year]  Follow-Up Visit Note  CC: Purvis Kilts, MD  Heath Lark, MD  Diagnosis and Prior Radiotherapy:   Squamous Cell Carcinoma of the right tonsil, p16+, T3N2cM0, Stage IVA  Indication for treatment: curative  Radiation treatment dates: 05/02/2013-06/22/2013  Site/dose: Right tonsil and bilateral neck / 70 Gy in 35 fractions to gross disease, 63 Gy in 35 fractions to high risk nodal echelons, and 56 Gy in 35 fractions to intermediate risk nodal echelons  Narrative:  The patient returns today for routine follow-up.  He and his wife feel devastated by the news they just received in Dr Calton Dach office - his PET is consistent with widespread lung metastases. He is going to see her next week to consider systemic therapy for this.  No other complaints today.                           ALLERGIES:  is allergic to neurontin and dilaudid.  Meds: Current Outpatient Prescriptions  Medication Sig Dispense Refill  . acetaminophen (TYLENOL) 500 MG tablet Take 500-1,000 mg by mouth every 4 (four) hours as needed for mild pain.       [name] alprazolam (XANAX) 2 MG tablet Take 2 mg by mouth 3 (three) times daily as needed for sleep or anxiety.       . Cholecalciferol (VITAMIN D3) 5000 UNITS CAPS Take 5,000 Units by mouth daily.      . citalopram (CELEXA) 20 MG tablet Take 20 mg by mouth daily.      . diphenhydrAMINE (BENADRYL) 25 mg capsule Take 1 capsule (25 mg total) by mouth every 6 (six) hours as needed for itching.  60 capsule  0  . docusate sodium (COLACE) 100 MG capsule Take 100 mg by mouth 2 (two) times daily as needed for mild constipation.      . hydrocortisone cream 1 % Apply 1 application topically 2 (two) times daily.  30 g  0  . lidocaine-prilocaine (EMLA) cream Apply 1 application topically as needed.  30 g  0  . ondansetron (ZOFRAN) 8 MG tablet Take 1  tablet (8 mg total) by mouth every 8 (eight) hours as needed for nausea.  60 tablet  3  . OxyCODONE (OXYCONTIN) 10 mg T12A 12 hr tablet Take 10 mg by mouth every 12 (twelve) hours.      [name] oxyCODONE-acetaminophen (PERCOCET) 10-325 MG per tablet Take 1 tablet by mouth every 4 (four) hours as needed for pain.      . pregabalin (LYRICA) 150 MG capsule Take 2 capsules (300 mg total) by mouth 2 (two) times daily.  60 capsule  3  . pregabalin (LYRICA) 300 MG capsule Take 1 capsule (300 mg total) by mouth 2 (two) times daily.  60 capsule  3  . promethazine (PHENERGAN) 25 MG tablet Take 1 tablet (25 mg total) by mouth every 6 (six) hours as needed for nausea.  60 tablet  3   No current facility-administered medications for this encounter.    Physical Findings: The patient is in no acute distress. Patient is alert and oriented.  height is 6\' 2"  (1.88 m) and weight is 213 lb 1.6 oz (96.662 kg). His blood pressure is 139/79 and his pulse is 72. .  Tearful. Skin over neck intact.  Further exam deferred. He was examined  minutes ago in medical oncology.  Lab Findings: Lab Results  Component Value Date   WBC 5.5 10/18/2013   HGB 13.6 10/18/2013   HCT 42.0 10/18/2013   MCV 86.8 10/18/2013   PLT 140 10/18/2013    Lab Results  Component Value Date   TSH 0.413 10/18/2013    Radiographic Findings: Nm Pet Image Restag (ps) Skull Base To Thigh  10/18/2013   CLINICAL DATA:  Subsequent treatment strategy for head neck carcinoma.  EXAM: NUCLEAR MEDICINE PET SKULL BASE TO THIGH  TECHNIQUE: 10.6 mCi F-18 FDG was injected intravenously. Full-ring PET imaging was performed from the skull base to thigh after the radiotracer. CT data was obtained and used for attenuation correction and anatomic localization.  FASTING BLOOD GLUCOSE:  Value: 173 mg/dl  COMPARISON:  03/25/2013.  FINDINGS: NECK  Large hypermetabolic soft tissue mass in the region of the right palate teen tonsil has resolved. No hypermetabolic cervical  adenopathy. 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  CHEST  Multiple new pulmonary nodules are identified in both lungs. Index nodule in the right upper lobe measures 1 cm and has an SUV max equal to 2.2. New hypermetabolic right hilar lymph nodes identified. Index right hilar lymph nodes has an SUV max is equal 6.6.  ABDOMEN/PELVIS  No abnormal hypermetabolic activity within the liver, pancreas, adrenal glands, or spleen. No hypermetabolic lymph nodes in the abdomen or pelvis.  SKELETON  No focal hypermetabolic activity to suggest skeletal metastasis.  IMPRESSION: 1. Interval mixed response to therapy. 2. Resolution of hypermetabolic tumor within the neck. 3. Interval development of hypermetabolic right hilar adenopathy and multi focal pulmonary nodules.   Electronically Signed   By: Kerby Moors M.D.   On: 10/18/2013 09:20    Impression/Plan:     Head and Neck Cancer Status:  Complete response in tonsil/neck to RT and Cetuximab. However, PET indicated lung metastases.  Further management will be deferred to medical oncology.  I will not schedule further followup in my clinic, but be happy to see him PRN if needs related to radiation oncology arise. At the patient's request, I called his best friend to relay the news from his PET scan. Mr and Mrs Klausing know to call at [place] time if they need emotional support from me, or Gayleen Orem, RN, our Head and Neck Oncology Navigator  Over 15 minutes spent face to face, > 50% of which was spent on counseling and coordination of care.  ____________________   Eppie Gibson, MD

## 2013-10-19 NOTE — Assessment & Plan Note (Signed)
Both the patient and his wife were very tearful upon review of his scans. Unfortunately he has now developed wide-spread lung metastases. He is not symptomatic. I told them goals of treatment from now on is palliative in nature. We discussed the importance of advance directives, living will, establishing MPOA and goals of life in this terminal situation. I plan to have another conversation with him and family next week to discuss options for treatment.

## 2013-10-19 NOTE — Progress Notes (Signed)
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 There is also a left thyroid mass measured 44 mm   03/21/2013 Procedure The patient was seen by ENT with laryngoscopy and biopsy. Pathology is pending   03/25/2013 Imaging PET scan showed large hypermetabolic soft tissue mass in the region of the right palatine tonsil with bilateral cervical hypermetabolic lymphadenopathy indicative of metastatic disease,   04/04/2013 Surgery He underwent placement of Port-A-Cath and feeding tube   04/14/2013 Procedure The patient underwent teeth extraction.   05/02/2013 - 06/13/2013 Chemotherapy The dosage of chemotherapy was interrupted many times due to side effects. His treatment is terminated early due to severe ulcerated skin toxicity.   05/02/2013 - 06/22/2013 Radiation Therapy The patient completed radiation treatment.   10/18/2013 Imaging PET CT scan showed complete response in the oropharynx. However, he has evidence of new bilateral pulmonary metastasis.    INTERVAL HISTORY: Please see below for problem oriented charting. He returns to review results  of PET scan. He denies new symptoms. Has persistent dysphagia and dry mouth  REVIEW OF SYSTEMS:   Constitutional: Denies fevers, chills or abnormal weight loss Eyes: Denies blurriness of vision Ears, nose, mouth, throat, and face: Denies mucositis or sore throat Respiratory: Denies cough, dyspnea or wheezes Cardiovascular: Denies palpitation, chest discomfort or lower extremity swelling Gastrointestinal:  Denies nausea, heartburn or change in bowel habits Skin: Denies abnormal skin rashes Lymphatics: Denies new lymphadenopathy or easy bruising Neurological:Denies numbness, tingling or new weaknesses Behavioral/Psych: Mood is stable, no new changes  All other systems were reviewed with the patient and are negative.  I have reviewed the past medical history, past surgical history, social history and family history with the patient and they are unchanged from previous note.  ALLERGIES:  is allergic to neurontin and dilaudid.  MEDICATIONS:  Current Outpatient Prescriptions  Medication Sig Dispense Refill  . acetaminophen (TYLENOL) 500 MG tablet Take 500-1,000 mg by mouth every 4 (four) hours as needed for mild pain.       [name] alprazolam (XANAX) 2 MG tablet Take 2 mg by mouth 3 (three) times daily as needed for sleep or anxiety.       . Cholecalciferol (VITAMIN D3) 5000 UNITS CAPS Take 5,000 Units by mouth daily.      . citalopram (CELEXA) 20 MG tablet Take 20 mg by mouth daily.      . diphenhydrAMINE (BENADRYL) 25 mg capsule Take 1 capsule (25 mg total) by mouth every 6 (six) hours as needed for itching.  60 capsule  0  . docusate sodium (COLACE) 100 MG capsule Take 100 mg by mouth 2 (  two) times daily as needed for mild constipation.      . hydrocortisone cream 1 % Apply 1 application topically 2 (two) times daily.  30 g  0  . ondansetron (ZOFRAN) 8 MG tablet Take 1 tablet (8 mg total) by mouth every 8 (eight) hours as needed for nausea.  60 tablet  3  . OxyCODONE (OXYCONTIN) 10 mg T12A 12 hr  tablet Take 10 mg by mouth every 12 (twelve) hours.      [name] oxyCODONE-acetaminophen (PERCOCET) 10-325 MG per tablet Take 1 tablet by mouth every 4 (four) hours as needed for pain.      . pregabalin (LYRICA) 150 MG capsule Take 2 capsules (300 mg total) by mouth 2 (two) times daily.  60 capsule  3  . pregabalin (LYRICA) 300 MG capsule Take 1 capsule (300 mg total) by mouth 2 (two) times daily.  60 capsule  3  . lidocaine-prilocaine (EMLA) cream Apply 1 application topically as needed.  30 g  0  . promethazine (PHENERGAN) 25 MG tablet Take 1 tablet (25 mg total) by mouth every 6 (six) hours as needed for nausea.  60 tablet  3   No current facility-administered medications for this visit.    PHYSICAL EXAMINATION: ECOG PERFORMANCE STATUS: 1 - Symptomatic but completely ambulatory  Filed Vitals:   10/19/13 1045  BP: 114/69  Pulse: 78  Temp: 97.1 F (36.2 C)  Resp: 18   Filed Weights   10/19/13 1045  Weight: 212 lb 14.4 oz (96.571 kg)    GENERAL:alert, no distress and comfortable. He is obese. SKIN: skin color, texture, turgor are normal, no rashes or significant lesions EYES: normal, Conjunctiva are pink and non-injected, sclera clear OROPHARYNX:no exudate, no erythema and lips, buccal mucosa, and tongue normal  NECK: supple, thyroid normal size, non-tender, without nodularity LYMPH:  no palpable lymphadenopathy in the cervical, axillary or inguinal LUNGS: clear to auscultation and percussion with normal breathing effort HEART: regular rate & rhythm and no murmurs and no lower extremity edema ABDOMEN:abdomen soft, non-tender and normal bowel sounds Musculoskeletal:no cyanosis of digits and no clubbing  NEURO: alert & oriented x 3 with fluent speech, no focal motor/sensory deficits  LABORATORY DATA:  I have reviewed the data as listed    Component Value Date/Time   NA 143 10/18/2013 0938   NA 141 04/04/2013 0732   K 3.9 10/18/2013 0938   K 3.9 04/04/2013 0732   CL 103 04/04/2013  0732   CO2 29 10/18/2013 0938   CO2 25 04/04/2013 0732   GLUCOSE 98 10/18/2013 0938   GLUCOSE 136* 04/04/2013 0732   BUN 11.5 10/18/2013 0938   BUN 12 04/04/2013 0732   CREATININE 0.7 10/18/2013 0938   CREATININE 0.72 04/04/2013 0732   CALCIUM 9.7 10/18/2013 0938   CALCIUM 9.3 04/04/2013 0732   PROT 8.0 10/18/2013 0938   PROT 7.1 04/30/2010 0406   ALBUMIN 3.6 10/18/2013 0938   ALBUMIN 2.7* 04/30/2010 0406   AST 18 10/18/2013 0938   AST 31 04/30/2010 0406   ALT 10 10/18/2013 0938   ALT 30 04/30/2010 0406   ALKPHOS 81 10/18/2013 0938   ALKPHOS 75 04/30/2010 0406   BILITOT 0.50 10/18/2013 0938   BILITOT 0.9 04/30/2010 0406   GFRNONAA >90 04/04/2013 0732   GFRAA >90 04/04/2013 0732    No results found for this basename: SPEP, UPEP,  kappa and lambda light chains    Lab Results  Component Value Date   WBC 5.5 10/18/2013  NEUTROABS 4.3 10/18/2013   HGB 13.6 10/18/2013   HCT 42.0 10/18/2013   MCV 86.8 10/18/2013   PLT 140 10/18/2013      Chemistry      Component Value Date/Time   NA 143 10/18/2013 0938   NA 141 04/04/2013 0732   K 3.9 10/18/2013 0938   K 3.9 04/04/2013 0732   CL 103 04/04/2013 0732   CO2 29 10/18/2013 0938   CO2 25 04/04/2013 0732   BUN 11.5 10/18/2013 0938   BUN 12 04/04/2013 0732   CREATININE 0.7 10/18/2013 0938   CREATININE 0.72 04/04/2013 0732      Component Value Date/Time   CALCIUM 9.7 10/18/2013 0938   CALCIUM 9.3 04/04/2013 0732   ALKPHOS 81 10/18/2013 0938   ALKPHOS 75 04/30/2010 0406   AST 18 10/18/2013 0938   AST 31 04/30/2010 0406   ALT 10 10/18/2013 0938   ALT 30 04/30/2010 0406   BILITOT 0.50 10/18/2013 0938   BILITOT 0.9 04/30/2010 0406       RADIOGRAPHIC STUDIES: I reviewed the scans with him and his wife I have personally reviewed the radiological images as listed and agreed with the findings in the report. 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  EXAM: NUCLEAR MEDICINE PET  SKULL BASE TO THIGH  TECHNIQUE: 10.6 mCi F-18 FDG was injected intravenously. Full-ring PET imaging was performed from the skull base to thigh after the radiotracer. CT data was obtained and used for attenuation correction and anatomic localization.  FASTING BLOOD GLUCOSE:  Value: 173 mg/dl  COMPARISON:  03/25/2013.  FINDINGS: NECK  Large hypermetabolic soft tissue mass in the region of the right palate teen tonsil has resolved. No hypermetabolic cervical adenopathy. 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  CHEST  Multiple new pulmonary nodules are identified in both lungs. Index nodule in the right upper lobe measures 1 cm and has an SUV max equal to 2.2. New hypermetabolic right hilar lymph nodes identified. Index right hilar lymph nodes has an SUV max is equal 6.6.  ABDOMEN/PELVIS  No abnormal hypermetabolic activity within the liver, pancreas, adrenal glands, or spleen. No hypermetabolic lymph nodes in the abdomen or pelvis.  SKELETON  No focal hypermetabolic activity to suggest skeletal metastasis.  IMPRESSION: 1. Interval mixed response to therapy. 2. Resolution of hypermetabolic tumor within the neck. 3. Interval development of hypermetabolic right hilar adenopathy and multi focal pulmonary nodules.   Electronically Signed   By: Kerby Moors M.D.   On: 10/18/2013 09:20     ASSESSMENT & PLAN:  Tonsil cancer Both the patient and his wife were very tearful upon review of his scans. Unfortunately he has now developed wide-spread lung metastases. He is not symptomatic. I told them goals of treatment from now on is palliative in nature. We discussed the importance of advance directives, living will, establishing MPOA and goals of life in this terminal situation. I plan to have another conversation with him and family next week to discuss options for treatment.  Metastasis to lung Even without biopsy, this likely represent metastatic cancer from the head and neck region.  Currently, he is not symptomatic  All questions were answered. The patient knows to call the clinic with any problems, questions or concerns. No barriers to learning was detected. I spent 40 minutes counseling the patient face to face. The total time spent in the appointment was 60 minutes  and more than 50% was on counseling and review of test results     [facility], Kinta Martis, MD 10/19/2013 8:08 PM

## 2013-10-19 NOTE — Assessment & Plan Note (Signed)
Even without biopsy, this likely represent metastatic cancer from the head and neck region. Currently, he is not symptomatic

## 2013-10-19 NOTE — Telephone Encounter (Signed)
gv adn printed appt sched and avs for pt for OCT

## 2013-10-20 ENCOUNTER — Telehealth: Payer: Self-pay | Admitting: *Deleted

## 2013-10-20 NOTE — Telephone Encounter (Signed)
Entry error

## 2013-10-21 ENCOUNTER — Telehealth: Payer: Self-pay | Admitting: *Deleted

## 2013-10-21 ENCOUNTER — Encounter: Payer: Self-pay | Admitting: Radiation Oncology

## 2013-10-21 ENCOUNTER — Telehealth: Payer: Self-pay | Admitting: Hematology and Oncology

## 2013-10-21 NOTE — Telephone Encounter (Signed)
To provide support, called patient and wife in follow-up to Wednesday's appts with Drs. Alvy Bimler and Isidore Moos.  He stated he was thinking he would proceed with chemotherapy, "I did pretty well last time, it may give me 5 years".  I affirmed his thoughts, encouraged him to continue processing with his family and close friends.  He confirmed his understanding of an appt with Dr. Alvy Bimler for next Wed.  Gayleen Orem, RN, BSN, [place] at [place] [phone]

## 2013-10-21 NOTE — Telephone Encounter (Signed)
I attempted to call his associate, Edythe Clarity but not able to reach him. I have left him a voice mail.

## 2013-10-23 NOTE — Progress Notes (Signed)
To provide care continuity, met with patient and his wife during appt with Dr. Alvy Bimler, during which they learned that yesterday's PET indicates he has metastatic disease in his lungs, the condition is terminal but timeframe is not immediate.  Patient and wife visibly upset, tearful; I provided emotional support.  Dr. Alvy Bimler explained that chemotherapy is a tmt option, she encouraged them to consider this, they can discuss further next week when she meets with them.   At their request, I facilitated cancellation of appt with Dr. Enrique Sack later in the day.   I escorted and stayed with them during appt with Dr. Isidore Moos to provide additional support, I walked with them to [facility] lobby afterwards, facilitated Valet return of their car.  I indicated I would call them.  They expressed appreciation for my support.  Gayleen Orem, RN, BSN, [place] at [place] [phone]

## 2013-10-26 ENCOUNTER — Telehealth: Payer: Self-pay | Admitting: Hematology and Oncology

## 2013-10-26 ENCOUNTER — Encounter (HOSPITAL_COMMUNITY): Payer: Self-pay | Admitting: Dentistry

## 2013-10-26 ENCOUNTER — Encounter: Payer: Self-pay | Admitting: *Deleted

## 2013-10-26 ENCOUNTER — Ambulatory Visit (HOSPITAL_COMMUNITY): Payer: Self-pay | Admitting: Dentistry

## 2013-10-26 ENCOUNTER — Ambulatory Visit (HOSPITAL_BASED_OUTPATIENT_CLINIC_OR_DEPARTMENT_OTHER): Payer: 59 | Admitting: Hematology and Oncology

## 2013-10-26 ENCOUNTER — Encounter: Payer: Self-pay | Admitting: Hematology and Oncology

## 2013-10-26 VITALS — BP 121/89 | HR 70 | Temp 98.3°F

## 2013-10-26 VITALS — BP 135/86 | HR 81 | Temp 98.2°F | Resp 20 | Ht 74.0 in | Wt 216.1 lb

## 2013-10-26 DIAGNOSIS — R682 Dry mouth, unspecified: Secondary | ICD-10-CM

## 2013-10-26 DIAGNOSIS — K08109 Complete loss of teeth, unspecified cause, unspecified class: Secondary | ICD-10-CM

## 2013-10-26 DIAGNOSIS — Z463 Encounter for fitting and adjustment of dental prosthetic device: Secondary | ICD-10-CM

## 2013-10-26 DIAGNOSIS — C099 Malignant neoplasm of tonsil, unspecified: Secondary | ICD-10-CM

## 2013-10-26 DIAGNOSIS — Z923 Personal history of irradiation: Secondary | ICD-10-CM

## 2013-10-26 DIAGNOSIS — K0820 Unspecified atrophy of edentulous alveolar ridge: Secondary | ICD-10-CM

## 2013-10-26 DIAGNOSIS — C7800 Secondary malignant neoplasm of unspecified lung: Secondary | ICD-10-CM

## 2013-10-26 DIAGNOSIS — C7801 Secondary malignant neoplasm of right lung: Secondary | ICD-10-CM

## 2013-10-26 DIAGNOSIS — Z9221 Personal history of antineoplastic chemotherapy: Secondary | ICD-10-CM

## 2013-10-26 DIAGNOSIS — K000 Anodontia: Principal | ICD-10-CM

## 2013-10-26 DIAGNOSIS — C7802 Secondary malignant neoplasm of left lung: Secondary | ICD-10-CM

## 2013-10-26 DIAGNOSIS — K117 Disturbances of salivary secretion: Secondary | ICD-10-CM

## 2013-10-26 NOTE — Patient Instructions (Signed)
Return to clinic as scheduled for continued upper and lower complete denture fabrication. Dr. Enrique Sack

## 2013-10-26 NOTE — Progress Notes (Signed)
10/26/2013  Patient Name:   [name] Date of Birth:   [birth year] Medical Record Number: [id number]  BP 121/89  Pulse 70  Temp(Src) 98.3 F (36.8 C) (Oral)  Puneet M Howerter presents for continued denture fabrication. Procedure:  Upper and lower denture Jaw relations with aluwax bite registration. Patient agrees to tooth selection of 11H, S, and 10 degree posteriors to match with Portrait A2 shade. Patient is aware that 11Fand lower R may be used if space is an issue due to previous crowded teeth. Patient tolerated procedure well. RTC for denture wax try in.   Lenn Cal, DDS

## 2013-10-26 NOTE — Progress Notes (Signed)
To provide support and encouragement, care continuity and to assess for needs, met with patient and his family during appt with Dr. Alvy Bimler.  1. Elye indicated he was interested in chemo, expressed desire to go to the beach and celebrate his BD at end of November in absence of chemo SEs.  Dr. Alvy Bimler to schedule a PET in late November and an appt in the first week of December to discuss start of chemo. 2. Dtr expressed concern about telling and support her son, who is very close to [place], about current prognosis.  I encouraged her to contact hospice for their guidance.  She agreed. I will continue to follow.  Gayleen Orem, RN, BSN, [place] at [place] [phone]

## 2013-10-26 NOTE — Assessment & Plan Note (Signed)
I reviewed his current situation with the patient and family. I told him, with the current stage of disease, he is no longer considered curable. Any forms of chemotherapy would be considered palliative. We discussed the goal of palliative chemotherapy. The patient hopes that he would still feel well to celebrate his birthday and his granddaughter's birthday together next month. After a very long discussion, we agreed to delay initiation of treatment until December. The plan is to repeat another staging PET/CT scan after Thanksgiving and we will regroup again to discuss plan of care. We discussed about advanced directives and living will.

## 2013-10-26 NOTE — Telephone Encounter (Signed)
gv and printed appt sched and avs for pt for NOV and Dec

## 2013-10-26 NOTE — Progress Notes (Signed)
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 There is also a left thyroid mass measured 44 mm   03/21/2013 Procedure The patient was seen by ENT with laryngoscopy and biopsy. Pathology is pending   03/25/2013 Imaging PET scan showed large hypermetabolic soft tissue mass in the region of the right palatine tonsil with bilateral cervical hypermetabolic lymphadenopathy indicative of metastatic disease,   04/04/2013 Surgery He underwent placement of Port-A-Cath and feeding tube   04/14/2013 Procedure The patient underwent teeth extraction.   05/02/2013 - 06/13/2013 Chemotherapy The dosage of chemotherapy was interrupted many times due to side effects. His treatment is terminated early due to severe ulcerated skin toxicity.   05/02/2013 - 06/22/2013 Radiation Therapy The patient completed radiation treatment.   10/18/2013 Imaging PET CT scan showed complete response in the oropharynx. However, he has evidence of new bilateral pulmonary metastasis.    INTERVAL HISTORY: Please see below for problem oriented charting. He feels well.  REVIEW OF  SYSTEMS:   Constitutional: Denies fevers, chills or abnormal weight loss Eyes: Denies blurriness of vision Ears, nose, mouth, throat, and face: Denies mucositis or sore throat Respiratory: Denies cough, dyspnea or wheezes Cardiovascular: Denies palpitation, chest discomfort or lower extremity swelling Gastrointestinal:  Denies nausea, heartburn or change in bowel habits Skin: Denies abnormal skin rashes Lymphatics: Denies new lymphadenopathy or easy bruising Neurological:Denies numbness, tingling or new weaknesses Behavioral/Psych: Mood is stable, no new changes  All other systems were reviewed with the patient and are negative.  I have reviewed the past medical history, past surgical history, social history and family history with the patient and they are unchanged from previous note.  ALLERGIES:  is allergic to neurontin and dilaudid.  MEDICATIONS:  Current Outpatient Prescriptions  Medication Sig Dispense Refill  . acetaminophen (TYLENOL) 500 MG tablet Take 500-1,000 mg by mouth every 4 (four) hours as needed for mild pain.       [name] alprazolam (XANAX) 2 MG tablet Take 2 mg by mouth 3 (three) times daily as needed for sleep or anxiety.       . Cholecalciferol (VITAMIN D3) 5000 UNITS CAPS Take 5,000 Units by mouth daily.      . citalopram (CELEXA) 20 MG tablet Take 20 mg by mouth daily.      . diphenhydrAMINE (BENADRYL) 25 mg capsule Take 1 capsule (25 mg total) by mouth every 6 (six) hours as needed for itching.  60 capsule  0  . docusate sodium (COLACE) 100 MG capsule Take 100 mg by mouth 2 (two) times daily as needed for mild constipation.      . hydrocortisone  cream 1 % Apply 1 application topically 2 (two) times daily.  30 g  0  . lidocaine-prilocaine (EMLA) cream Apply 1 application topically as needed.  30 g  0  . ondansetron (ZOFRAN) 8 MG tablet Take 1 tablet (8 mg total) by mouth every 8 (eight) hours as needed for nausea.  60 tablet  3  . OxyCODONE (OXYCONTIN) 10 mg T12A 12 hr  tablet Take 10 mg by mouth every 12 (twelve) hours.      [name] oxyCODONE-acetaminophen (PERCOCET) 10-325 MG per tablet Take 1 tablet by mouth every 4 (four) hours as needed for pain.      . pregabalin (LYRICA) 150 MG capsule Take 2 capsules (300 mg total) by mouth 2 (two) times daily.  60 capsule  3  . pregabalin (LYRICA) 300 MG capsule Take 1 capsule (300 mg total) by mouth 2 (two) times daily.  60 capsule  3  . promethazine (PHENERGAN) 25 MG tablet Take 1 tablet (25 mg total) by mouth every 6 (six) hours as needed for nausea.  60 tablet  3   No current facility-administered medications for this visit.    PHYSICAL EXAMINATION: ECOG PERFORMANCE STATUS: 1 - Symptomatic but completely ambulatory  Filed Vitals:   10/26/13 1212  BP: 135/86  Pulse: 81  Temp: 98.2 F (36.8 C)  Resp: 20   Filed Weights   10/26/13 1212  Weight: 216 lb 1.6 oz (98.022 kg)    GENERAL:alert, no distress and comfortable SKIN: skin color, texture, turgor are normal, no rashes or significant lesions. He has persistent skin discoloration from recent radiation treatment EYES: normal, Conjunctiva are pink and non-injected, sclera clear. He appears very tearful OROPHARYNX:no exudate, no erythema and lips, buccal mucosa, and tongue normal  Musculoskeletal:no cyanosis of digits and no clubbing  NEURO: alert & oriented x 3 with fluent speech, no focal motor/sensory deficits  LABORATORY DATA:  I have reviewed the data as listed    Component Value Date/Time   NA 143 10/18/2013 0938   NA 141 04/04/2013 0732   K 3.9 10/18/2013 0938   K 3.9 04/04/2013 0732   CL 103 04/04/2013 0732   CO2 29 10/18/2013 0938   CO2 25 04/04/2013 0732   GLUCOSE 98 10/18/2013 0938   GLUCOSE 136* 04/04/2013 0732   BUN 11.5 10/18/2013 0938   BUN 12 04/04/2013 0732   CREATININE 0.7 10/18/2013 0938   CREATININE 0.72 04/04/2013 0732   CALCIUM 9.7 10/18/2013 0938   CALCIUM 9.3 04/04/2013 0732   PROT 8.0 10/18/2013 0938   PROT 7.1 04/30/2010 0406   ALBUMIN  3.6 10/18/2013 0938   ALBUMIN 2.7* 04/30/2010 0406   AST 18 10/18/2013 0938   AST 31 04/30/2010 0406   ALT 10 10/18/2013 0938   ALT 30 04/30/2010 0406   ALKPHOS 81 10/18/2013 0938   ALKPHOS 75 04/30/2010 0406   BILITOT 0.50 10/18/2013 0938   BILITOT 0.9 04/30/2010 0406   GFRNONAA >90 04/04/2013 0732   GFRAA >90 04/04/2013 0732    No results found for this basename: SPEP, UPEP,  kappa and lambda light chains    Lab Results  Component Value Date   WBC 5.5 10/18/2013   NEUTROABS 4.3 10/18/2013   HGB 13.6 10/18/2013   HCT 42.0 10/18/2013   MCV 86.8 10/18/2013   PLT 140 10/18/2013      Chemistry      Component Value Date/Time   NA 143 10/18/2013 0938   NA 141 04/04/2013 0732   K 3.9 10/18/2013  0938   K 3.9 04/04/2013 0732   CL 103 04/04/2013 0732   CO2 29 10/18/2013 0938   CO2 25 04/04/2013 0732   BUN 11.5 10/18/2013 0938   BUN 12 04/04/2013 0732   CREATININE 0.7 10/18/2013 0938   CREATININE 0.72 04/04/2013 0732      Component Value Date/Time   CALCIUM 9.7 10/18/2013 0938   CALCIUM 9.3 04/04/2013 0732   ALKPHOS 81 10/18/2013 0938   ALKPHOS 75 04/30/2010 0406   AST 18 10/18/2013 0938   AST 31 04/30/2010 0406   ALT 10 10/18/2013 0938   ALT 30 04/30/2010 0406   BILITOT 0.50 10/18/2013 0938   BILITOT 0.9 04/30/2010 0406      ASSESSMENT & PLAN:  Tonsil cancer I reviewed his current situation with the patient and family. I told him, with the current stage of disease, he is no longer considered curable. Any forms of chemotherapy would be considered palliative. We discussed the goal of palliative chemotherapy. The patient hopes that he would still feel well to celebrate his birthday and his granddaughter's birthday together next month. After a very long discussion, we agreed to delay initiation of treatment until December. The plan is to repeat another staging PET/CT scan after Thanksgiving and we will regroup again to discuss plan of care. We discussed about advanced directives and living  will.   Orders Placed This Encounter  Procedures  . NM PET Image Restag (PS) Skull Base To Thigh    Standing Status: Future     Number of Occurrences:      Standing Expiration Date: 12/26/2014    Order Specific Question:  Reason for Exam (SYMPTOM  OR DIAGNOSIS REQUIRED)    Answer:  staging metastatic tonsil ca to lungs    Order Specific Question:  Preferred imaging location?    Answer:  [facility]  . CBC with Differential    Standing Status: Future     Number of Occurrences:      Standing Expiration Date: 11/30/2014  . Comprehensive metabolic panel    Standing Status: Future     Number of Occurrences:      Standing Expiration Date: 11/30/2014  . Lactate dehydrogenase    Standing Status: Future     Number of Occurrences:      Standing Expiration Date: 11/30/2014   All questions were answered. The patient knows to call the clinic with any problems, questions or concerns. No barriers to learning was detected. I spent 30 minutes counseling the patient face to face. The total time spent in the appointment was 40 minutes and more than 50% was on counseling and review of test results     [facility], [place], MD 10/26/2013 9:28 PM

## 2013-10-27 ENCOUNTER — Telehealth: Payer: Self-pay | Admitting: *Deleted

## 2013-10-27 NOTE — Telephone Encounter (Signed)
Returned patient's wife's VM.  Provided clarification, per their discussion with Dr. Alvy Bimler yesterday, that the goal for upcoming chemotherapy is to slow the growth of the cancer; it is not curative.  She voiced understanding.  Gayleen Orem, RN, BSN, [place] at [place] [phone]

## 2013-11-03 ENCOUNTER — Ambulatory Visit (HOSPITAL_COMMUNITY): Payer: Self-pay | Admitting: Dentistry

## 2013-11-03 ENCOUNTER — Encounter (HOSPITAL_COMMUNITY): Payer: Self-pay | Admitting: Dentistry

## 2013-11-03 VITALS — BP 108/78 | HR 82 | Temp 98.4°F

## 2013-11-03 DIAGNOSIS — Z923 Personal history of irradiation: Secondary | ICD-10-CM

## 2013-11-03 DIAGNOSIS — K08109 Complete loss of teeth, unspecified cause, unspecified class: Secondary | ICD-10-CM

## 2013-11-03 DIAGNOSIS — Z463 Encounter for fitting and adjustment of dental prosthetic device: Secondary | ICD-10-CM

## 2013-11-03 DIAGNOSIS — R682 Dry mouth, unspecified: Secondary | ICD-10-CM

## 2013-11-03 DIAGNOSIS — K0820 Unspecified atrophy of edentulous alveolar ridge: Secondary | ICD-10-CM

## 2013-11-03 DIAGNOSIS — Z9221 Personal history of antineoplastic chemotherapy: Secondary | ICD-10-CM

## 2013-11-03 DIAGNOSIS — C7800 Secondary malignant neoplasm of unspecified lung: Secondary | ICD-10-CM

## 2013-11-03 DIAGNOSIS — K000 Anodontia: Principal | ICD-10-CM

## 2013-11-03 DIAGNOSIS — K117 Disturbances of salivary secretion: Secondary | ICD-10-CM

## 2013-11-03 NOTE — Patient Instructions (Signed)
Return to clinic as scheduled for continued upper and lower complete denture fabrication. Dr. Enrique Sack

## 2013-11-03 NOTE — Progress Notes (Signed)
11/03/2013  Patient Name:   [name] Date of Birth:   1950-01-22 Medical Record Number: [id number]   BP 108/78  Pulse 82  Temp(Src) 98.4 F (36.9 C) (Oral)  [name] presents for continued upper and lower denture fabrication.  Procedure:  Upper and lower denture wax tryin. Patient and wife accepts the esthetics, phonetics, fit and function. Patient agrees to process "as is" in [place] [address]. Patient to RTC for  upper and lower denture insertion.  Lenn Cal, DDS

## 2013-11-10 ENCOUNTER — Encounter (HOSPITAL_COMMUNITY): Payer: Self-pay | Admitting: Dentistry

## 2013-11-10 ENCOUNTER — Ambulatory Visit (HOSPITAL_COMMUNITY): Payer: Self-pay | Admitting: Dentistry

## 2013-11-10 VITALS — BP 99/66 | HR 70 | Temp 98.4°F

## 2013-11-10 DIAGNOSIS — K117 Disturbances of salivary secretion: Secondary | ICD-10-CM

## 2013-11-10 DIAGNOSIS — Z463 Encounter for fitting and adjustment of dental prosthetic device: Secondary | ICD-10-CM

## 2013-11-10 DIAGNOSIS — K08109 Complete loss of teeth, unspecified cause, unspecified class: Secondary | ICD-10-CM

## 2013-11-10 DIAGNOSIS — K0820 Unspecified atrophy of edentulous alveolar ridge: Secondary | ICD-10-CM

## 2013-11-10 DIAGNOSIS — C099 Malignant neoplasm of tonsil, unspecified: Secondary | ICD-10-CM

## 2013-11-10 DIAGNOSIS — Z9221 Personal history of antineoplastic chemotherapy: Secondary | ICD-10-CM

## 2013-11-10 DIAGNOSIS — R682 Dry mouth, unspecified: Secondary | ICD-10-CM

## 2013-11-10 DIAGNOSIS — Z923 Personal history of irradiation: Secondary | ICD-10-CM

## 2013-11-10 DIAGNOSIS — K000 Anodontia: Principal | ICD-10-CM

## 2013-11-10 NOTE — Patient Instructions (Signed)
Instructions for Denture Use and Care  Congratulations, you are on the way to oral rehabilitation!  You have just received a new set of complete or partial dentures.  These prostheses will help to improve both your appearance and chewing ability.  These instructions will help you get adjusted to your dentures as well as care for them properly.  Please read these instructions carefully and completely as soon as you get home.  If you or your caregiver have any questions please notify the [facility] at [phone].  HOW YOUR DENTURES LOOK AND FEEL Soon after you begin wearing your dentures, you may feel that your dentures are too large or even loose.  As our mouth and facial muscles become accustomed to the dentures, these feelings will go away.  You also may feel that you are salivating more than you normally do.  This feeling should go away as you get used to having the dentures in your mouth.  You may bite your cheek or your tongue; this will eventually resolve itself as you wear your dentures.  Some soreness is to be expected, but you should not hurt.  If your mouth hurts, call your dentist.  A denture adhesive may occasionally be necessary to hold your dentures in place more securely.  The dentist will let you know when one is recommended for you.  SPEAKING Wearing dentures will change the sound of your voice initially.  This will be noticed by you more than anyone else.  Bite and swallow before you speak, in order to place your dentures in position so that you may speak more clearly.  Practice speaking by reading aloud or counting from 1 to 100 very slowly and distinctly.  After some practice your mouth will become accustomed to your dentures and you will speak more clearly.  EATING Chewing will definitely be different after you receive your dentures.  With a little practice and patience you should be able to eat just about any kind of food.  Begin by eating small quantities of food  that are cut into small pieces.  Star with soft foods such as eggs, cooked vegetables, or puddings.  As you gain confidence advance  Your diet to whatever texture foods you can tolerate.  DENTURE CARE Dentures can collect plaque and calculus much the same as natural teeth can.  If not removed on a regular basis, your dentures will not look or feel clean, and you will experience denture odor.  It is very important that you remove your dentures at bedtime and clean them thoroughly.  You should: 1. Clean your dentures over a sink full of water so if dropped, breakage will be prevented. 2. Rinse your dentures with cool water to remove any large food particles. 3. Use soap and water or a denture cleanser or paste to clean the dentures.  Do not use regular toothpaste as it may abrade the denture base or teeth. 4. Use a moistened denture brush to clean all surfaces (inside and outside). 5. Rinse thoroughly to remove any remaining soap or denture cleanser. 6. Use a soft bristle toothbrush to gently brush any natural teeth, gums, tongue, and palate at bedtime and before reinserting your dentures. 7. Do not sleep with your dentures in your mouth at night.  Remove your dentures and soak them overnight in a denture cup filled with water or denture solution as recommended by your dentist.  This routine will become second nature and will increase the life and comfort  of your dentures.  Please do not try to adjust these dentures yourself; you could damage them.  FOLLOW-UP You should call or make an appointment with your dentist.  Your dentist would like to see you at least once a year for a check-up and examination.

## 2013-11-10 NOTE — Progress Notes (Signed)
11/10/2013  Patient Name:   [name] Date of Birth:   03/23/1950 Medical Record Number: [id number]  BP 99/66  Pulse 70  Temp(Src) 98.4 F (36.9 C) (Oral)  [name] presents for insertion of upper and lower complete dentures.  Procedure: Pressure indicating paste was applied to the dentures. Adjustments were made as needed. [place] Occlusion evaluated and adjustments made as needed for Centric Relation and protrusive strokes. Good esthetics, phonetics, fit, and function noted. Patient accepts results. Post op instructions provided in written and verbal formats on use and care of dentures. Gave patient denture brush and cup. Patient to keep dentures out if sore spots develop. Use salt water rinses as needed to aid healing. Return to clinic as scheduled for denture adjustment.   Call if problems arise before then.  Lenn Cal, DDS

## 2013-11-15 ENCOUNTER — Encounter (HOSPITAL_COMMUNITY): Payer: Self-pay | Admitting: Dentistry

## 2013-11-18 ENCOUNTER — Telehealth: Payer: Self-pay | Admitting: *Deleted

## 2013-11-18 ENCOUNTER — Encounter (HOSPITAL_COMMUNITY): Payer: Self-pay | Admitting: Dentistry

## 2013-11-18 ENCOUNTER — Ambulatory Visit (HOSPITAL_COMMUNITY): Payer: Self-pay | Admitting: Dentistry

## 2013-11-18 VITALS — BP 125/73 | HR 76 | Temp 98.0°F

## 2013-11-18 DIAGNOSIS — Z463 Encounter for fitting and adjustment of dental prosthetic device: Secondary | ICD-10-CM

## 2013-11-18 DIAGNOSIS — K062 Gingival and edentulous alveolar ridge lesions associated with trauma: Secondary | ICD-10-CM

## 2013-11-18 DIAGNOSIS — Z9221 Personal history of antineoplastic chemotherapy: Secondary | ICD-10-CM

## 2013-11-18 DIAGNOSIS — K000 Anodontia: Secondary | ICD-10-CM

## 2013-11-18 DIAGNOSIS — C099 Malignant neoplasm of tonsil, unspecified: Secondary | ICD-10-CM

## 2013-11-18 DIAGNOSIS — K08109 Complete loss of teeth, unspecified cause, unspecified class: Secondary | ICD-10-CM

## 2013-11-18 DIAGNOSIS — Z923 Personal history of irradiation: Secondary | ICD-10-CM

## 2013-11-18 NOTE — Telephone Encounter (Signed)
Called patient to check on his well being; he was sleeping, spoke with his wife.  She reported he is doing "alright", getting out to walk his dog, not much energy otherwise.  I will continue to follow.  Gayleen Orem, RN, BSN, [place] at [place] [phone]

## 2013-11-18 NOTE — Progress Notes (Signed)
11/18/2013  Patient Name:   [name] Date of Birth:   12/10/1950 Medical Record Number: [id number]  BP 125/73 mmHg  Pulse 76  Temp(Src) 98 F (36.7 C) (Oral)  Evrett M Filsinger presents for evaluation of recently inserted upper and lower complete dentures. The patient had previously broken an appointment for denture evaluation due to illness. Patient now presents for evaluation of denture irritation. SUBJECTIVE: Patient is complaining of some minor denture irritation to the mandibular left and right lingaul alveolar ridge.  Patient indicates that he is able to eat "pretty well" with his dentures. 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 No evidence of denture irritation or erythema specifically seen. No ulcerations are noted Procedure: Pressure indicating paste was applied to the dentures. Adjustments were made as needed. [place] Occlusion evaluated and adjustments made as needed for Centric Relation and protrusive strokes. Patient with tendency to protrude lower jaw. Patient was instructed on finding maximum intercuspation position with good results. Patient accepts results.  Patient to keep dentures out if sore spots develop. Use salt water rinses as needed to aid healing. Return to clinic as scheduled for denture adjustment.   Call if problems arise before then.  Lenn Cal, DDS

## 2013-11-18 NOTE — Patient Instructions (Signed)
Patient to keep dentures out if sore spots develop. Use salt water rinses as needed to aid healing. Return to clinic as scheduled for denture adjustment.   Call if problems arise before then.  Lenn Cal, DDS

## 2013-11-28 ENCOUNTER — Encounter (HOSPITAL_COMMUNITY): Payer: Self-pay | Admitting: Dentistry

## 2013-11-28 ENCOUNTER — Ambulatory Visit (HOSPITAL_COMMUNITY): Payer: Self-pay | Admitting: Dentistry

## 2013-11-28 VITALS — BP 93/68 | HR 61 | Temp 98.3°F

## 2013-11-28 DIAGNOSIS — K000 Anodontia: Secondary | ICD-10-CM

## 2013-11-28 DIAGNOSIS — R682 Dry mouth, unspecified: Secondary | ICD-10-CM

## 2013-11-28 DIAGNOSIS — K117 Disturbances of salivary secretion: Secondary | ICD-10-CM

## 2013-11-28 DIAGNOSIS — Z463 Encounter for fitting and adjustment of dental prosthetic device: Secondary | ICD-10-CM

## 2013-11-28 DIAGNOSIS — K08109 Complete loss of teeth, unspecified cause, unspecified class: Secondary | ICD-10-CM

## 2013-11-28 DIAGNOSIS — C099 Malignant neoplasm of tonsil, unspecified: Secondary | ICD-10-CM

## 2013-11-28 DIAGNOSIS — K0820 Unspecified atrophy of edentulous alveolar ridge: Secondary | ICD-10-CM

## 2013-11-28 DIAGNOSIS — Z9221 Personal history of antineoplastic chemotherapy: Secondary | ICD-10-CM

## 2013-11-28 DIAGNOSIS — Z923 Personal history of irradiation: Secondary | ICD-10-CM

## 2013-11-28 NOTE — Patient Instructions (Signed)
Patient to keep dentures out if sore spots develop. Use salt water rinses as needed to aid healing. Return to clinic as scheduled for denture adjustment.   Call if problems arise before then.  Lenn Cal, DDS

## 2013-11-28 NOTE — Progress Notes (Signed)
11/28/2013  Patient Name:   [name] Date of Birth:   1950/05/11 Medical Record Number: [id number]  BP 93/68 mmHg  Pulse 61  Temp(Src) 98.3 F (36.8 C) (Oral)  [name] presents for evaluation of recently inserted upper and lower complete dentures.  SUBJECTIVE: Patient is NOT complaining of any denture irritation.  Patient indicates that he is able to eat "anything he wants" with his dentures. 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 No evidence of denture irritation or erythema specifically seen. No ulcerations are noted Procedure: Pressure indicating paste was applied to the dentures. Adjustments were made as needed. [place] Occlusion evaluated and adjustments made as needed for Centric Relation and protrusive strokes. Patient is now able to find maximum intercuspation without any problems. Patient accepts results.  Patient to keep dentures out if sore spots develop. Use salt water rinses as needed to aid healing. Return to clinic as scheduled for denture adjustment.   Call if problems arise before then.  Lenn Cal, DDS

## 2013-12-12 ENCOUNTER — Telehealth: Payer: Self-pay | Admitting: Hematology and Oncology

## 2013-12-12 ENCOUNTER — Ambulatory Visit (HOSPITAL_COMMUNITY): Payer: 59

## 2013-12-12 ENCOUNTER — Other Ambulatory Visit: Payer: Self-pay

## 2013-12-12 NOTE — Telephone Encounter (Signed)
pt called to r/s appt due to pick eye...done.Marland KitchenMarland KitchenMarland Kitchen

## 2013-12-13 ENCOUNTER — Ambulatory Visit: Payer: Self-pay | Admitting: Hematology and Oncology

## 2013-12-16 ENCOUNTER — Other Ambulatory Visit (HOSPITAL_BASED_OUTPATIENT_CLINIC_OR_DEPARTMENT_OTHER): Payer: 59

## 2013-12-16 ENCOUNTER — Encounter (HOSPITAL_COMMUNITY)
Admission: RE | Admit: 2013-12-16 | Discharge: 2013-12-16 | Disposition: A | Discharge status: Home / Self Care | Payer: 59 | Source: Ambulatory Visit | Attending: Hematology and Oncology | Admitting: Hematology and Oncology

## 2013-12-16 DIAGNOSIS — C7801 Secondary malignant neoplasm of right lung: Secondary | ICD-10-CM

## 2013-12-16 DIAGNOSIS — C7800 Secondary malignant neoplasm of unspecified lung: Secondary | ICD-10-CM | POA: Diagnosis present

## 2013-12-16 DIAGNOSIS — C7802 Secondary malignant neoplasm of left lung: Secondary | ICD-10-CM

## 2013-12-16 DIAGNOSIS — C099 Malignant neoplasm of tonsil, unspecified: Secondary | ICD-10-CM

## 2013-12-16 LAB — LACTATE DEHYDROGENASE (CC13): LDH: 125 U/L (ref 125–245)

## 2013-12-16 LAB — COMPREHENSIVE METABOLIC PANEL (CC13)
ALK PHOS: 84 U/L (ref 40–150)
ALT: 13 U/L (ref 0–55)
AST: 20 U/L (ref 5–34)
Albumin: 4 g/dL (ref 3.5–5.0)
Anion Gap: 8 mEq/L (ref 3–11)
BUN: 16.6 mg/dL (ref 7.0–26.0)
CO2: 32 mEq/L — ABNORMAL HIGH (ref 22–29)
Calcium: 9.9 mg/dL (ref 8.4–10.4)
Chloride: 101 mEq/L (ref 98–109)
Creatinine: 0.8 mg/dL (ref 0.7–1.3)
EGFR: >90 mL/min/{1.73_m2} (ref >90)
Glucose: 86 mg/dl (ref 70–140)
Potassium: 4.7 mEq/L (ref 3.5–5.1)
SODIUM: 142 meq/L (ref 136–145)
TOTAL PROTEIN: 8 g/dL (ref 6.4–8.3)
Total Bilirubin: 0.45 mg/dL (ref 0.20–1.20)

## 2013-12-16 LAB — CBC WITH DIFFERENTIAL/PLATELET
BASO%: 0.9 % (ref 0.0–2.0)
Basophils Absolute: 0.1 10*3/uL (ref 0.0–0.1)
EOS%: 3.5 % (ref 0.0–7.0)
Eosinophils Absolute: 0.2 10*3/uL (ref 0.0–0.5)
HCT: 42.4 % (ref 38.4–49.9)
HGB: 13.9 g/dL (ref 13.0–17.1)
LYMPH%: 11.3 % — ABNORMAL LOW (ref 14.0–49.0)
MCH: 29.8 pg (ref 27.2–33.4)
MCHC: 32.8 g/dL (ref 32.0–36.0)
MCV: 91 fL (ref 79.3–98.0)
MONO#: 0.6 10*3/uL (ref 0.1–0.9)
MONO%: 9.9 % (ref 0.0–14.0)
NEUT%: 74.4 % (ref 39.0–75.0)
NEUTROS ABS: 4.2 10*3/uL (ref 1.5–6.5)
Platelets: 167 10*3/uL (ref 140–400)
RBC: 4.66 10*6/uL (ref 4.20–5.82)
RDW: 15.3 % — AB (ref 11.0–14.6)
WBC: 5.6 10*3/uL (ref 4.0–10.3)
lymph#: 0.6 10*3/uL — ABNORMAL LOW (ref 0.9–3.3)

## 2013-12-16 LAB — GLUCOSE, CAPILLARY: Glucose-Capillary: 86 mg/dL (ref 70–99)

## 2013-12-16 MED ORDER — FLUDEOXYGLUCOSE F - 18 (FDG) INJECTION
11.5000 | Freq: Once | INTRAVENOUS | Status: AC | PRN
  Administered 2013-12-16: 11.5 via INTRAVENOUS

## 2013-12-26 ENCOUNTER — Encounter: Payer: Self-pay | Admitting: Hematology and Oncology

## 2013-12-26 ENCOUNTER — Telehealth: Payer: Self-pay | Admitting: *Deleted

## 2013-12-26 ENCOUNTER — Encounter: Payer: Self-pay | Admitting: *Deleted

## 2013-12-26 ENCOUNTER — Ambulatory Visit (HOSPITAL_BASED_OUTPATIENT_CLINIC_OR_DEPARTMENT_OTHER): Payer: 59 | Admitting: Hematology and Oncology

## 2013-12-26 VITALS — BP 135/78 | HR 77 | Temp 98.6°F | Resp 18 | Ht 74.0 in | Wt 205.4 lb

## 2013-12-26 DIAGNOSIS — E1140 Type 2 diabetes mellitus with diabetic neuropathy, unspecified: Secondary | ICD-10-CM

## 2013-12-26 DIAGNOSIS — E0840 Diabetes mellitus due to underlying condition with diabetic neuropathy, unspecified: Secondary | ICD-10-CM

## 2013-12-26 DIAGNOSIS — C099 Malignant neoplasm of tonsil, unspecified: Secondary | ICD-10-CM

## 2013-12-26 DIAGNOSIS — C770 Secondary and unspecified malignant neoplasm of lymph nodes of head, face and neck: Secondary | ICD-10-CM

## 2013-12-26 MED ORDER — PROMETHAZINE HCL 25 MG PO TABS: 25.0000 mg | ORAL_TABLET | Freq: Four times a day (QID) | ORAL | Status: DC | PRN

## 2013-12-26 MED ORDER — ONDANSETRON HCL 8 MG PO TABS: 8.0000 mg | ORAL_TABLET | Freq: Three times a day (TID) | ORAL | Status: DC | PRN

## 2013-12-26 NOTE — Assessment & Plan Note (Signed)
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 This has severely limit treatment options. His neuropathic pain is well-controlled.

## 2013-12-26 NOTE — Telephone Encounter (Signed)
Addison and notified of referral to Dr. Whitney Muse from Dr. Alvy Bimler.  Gave Dr. Calton Dach cell phone number for Dr. Whitney Muse to call regarding referral.

## 2013-12-26 NOTE — Assessment & Plan Note (Signed)
He has significant disease progression. Clinically, he is not symptomatic but I suspect without treatment, he may have widespread metastatic disease in the near future. We have extensive discussion about the role of palliative chemotherapy. He has multiple co-morbidities and tolerated treatment poorly in the past. I will recommend carboplatin/5-FU infusion as salvage chemotherapy. I would recommend 2-3 cycles of treatment before restaging scan. Due to his long distance travel, I will recommend he receive treatment closer to home. I have discussed with the local oncologist and will refer the patient to her for further management.

## 2013-12-26 NOTE — Progress Notes (Signed)
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 There is also a left thyroid mass measured 44 mm   03/21/2013 Procedure The patient was seen by ENT with laryngoscopy and biopsy. Pathology is pending   03/25/2013 Imaging PET scan showed large hypermetabolic soft tissue mass in the region of the right palatine tonsil with bilateral cervical hypermetabolic lymphadenopathy indicative of metastatic disease,   04/04/2013 Surgery He underwent placement of Port-A-Cath and feeding tube   04/14/2013 Procedure The patient underwent teeth extraction.   05/02/2013 - 06/13/2013 Chemotherapy The dosage of chemotherapy was interrupted many times due to side effects. His treatment is terminated early due to severe ulcerated skin toxicity.   05/02/2013 - 06/22/2013 Radiation Therapy The patient completed radiation treatment.   10/18/2013 Imaging PET CT scan showed complete response in the oropharynx. However, he has evidence of new bilateral pulmonary metastasis.   12/16/2013 Imaging Repeat  PET CT scan show significant progression of pulmonary metastases     INTERVAL HISTORY: Please see below for problem oriented charting. He is seen to review test results. He denies new lymphadenopathy. He has difficulties chewing his food. Denies recent weight loss. No dysphagia. Denies any cough. He has persistent peripheral neuropathy, well-controlled with current pain medicine.  REVIEW OF SYSTEMS:   Constitutional: Denies fevers, chills or abnormal weight loss Eyes: Denies blurriness of vision Ears, nose, mouth, throat, and face: Denies mucositis or sore throat Respiratory: Denies cough, dyspnea or wheezes Cardiovascular: Denies palpitation, chest discomfort or lower extremity swelling Gastrointestinal:  Denies nausea, heartburn or change in bowel habits Skin: Denies abnormal skin rashes Lymphatics: Denies new lymphadenopathy or easy bruising Neurological:Denies numbness, tingling or new weaknesses Behavioral/Psych: Mood is stable, no new changes  All other systems were reviewed with the patient and are negative.  I have reviewed the past medical history, past surgical history, social history and family history with the patient and they are unchanged from previous note.  ALLERGIES:  is allergic to neurontin and dilaudid.  MEDICATIONS:  Current Outpatient Prescriptions  Medication Sig Dispense Refill  . acetaminophen (TYLENOL) 500 MG tablet Take 500-1,000 mg by mouth every 4 (four) hours as needed for mild pain.     [name] alprazolam (XANAX) 2 MG tablet Take 2 mg by mouth 3 (three) times daily as needed for sleep or anxiety.     . citalopram (CELEXA) 20 MG tablet Take 20 mg by mouth daily.    . diphenhydrAMINE (BENADRYL) 25 mg capsule Take 1 capsule (25 mg total) by mouth every 6 (six) hours as needed for itching.  60 capsule 0  . docusate sodium (COLACE) 100 MG capsule Take 100 mg by mouth 2 (two) times daily as needed for mild constipation.    . ondansetron (ZOFRAN) 8 MG tablet Take 1  tablet (8 mg total) by mouth every 8 (eight) hours as needed for nausea. 60 tablet 3  . OxyCODONE (OXYCONTIN) 10 mg T12A 12 hr tablet Take 10 mg by mouth every 12 (twelve) hours.    [name] oxyCODONE-acetaminophen (PERCOCET) 10-325 MG per tablet Take 1 tablet by mouth every 4 (four) hours as needed for pain.    . pregabalin (LYRICA) 150 MG capsule Take 2 capsules (300 mg total) by mouth 2 (two) times daily. 60 capsule 3  . pregabalin (LYRICA) 300 MG capsule Take 1 capsule (300 mg total) by mouth 2 (two) times daily. 60 capsule 3  . promethazine (PHENERGAN) 25 MG tablet Take 1 tablet (25 mg total) by mouth every 6 (six) hours as needed for nausea. 60 tablet 3  . Cholecalciferol (VITAMIN D3) 5000 UNITS CAPS Take 5,000 Units by mouth daily.    . hydrocortisone cream 1 % Apply 1 application topically 2 (two) times daily. (Patient not taking: Reported on 12/26/2013) 30 g 0  . lidocaine-prilocaine (EMLA) cream Apply 1 application topically as needed. (Patient not taking: Reported on 12/26/2013) 30 g 0   No current facility-administered medications for this visit.    PHYSICAL EXAMINATION: ECOG PERFORMANCE STATUS: 1 - Symptomatic but completely ambulatory  Filed Vitals:   12/26/13 1253  BP: 135/78  Pulse: 77  Temp: 98.6 F (37 C)  Resp: 18   Filed Weights   12/26/13 1253  Weight: 205 lb 6.4 oz (93.169 kg)    GENERAL:alert, no distress and comfortable SKIN: skin color, texture, turgor are normal, no rashes or significant lesions. Noted radiation changes on his skin but previously noted dermatitis has healed EYES: normal, Conjunctiva are pink and non-injected, sclera clear OROPHARYNX:no exudate, no erythema and lips, buccal mucosa, and tongue normal  NECK: noted lymphedema around his neck. No palpable masses. LYMPH:  no palpable lymphadenopathy in the cervical, axillary or inguinal LUNGS: clear to auscultation and percussion with normal breathing effort HEART: regular rate & rhythm and no murmurs  and no lower extremity edema ABDOMEN:abdomen soft, non-tender and normal bowel sounds Musculoskeletal:no cyanosis of digits and no clubbing  NEURO: alert & oriented x 3 with fluent speech, no focal motor/sensory deficits  LABORATORY DATA:  I have reviewed the data as listed    Component Value Date/Time   NA 142 12/16/2013 1305   NA 141 04/04/2013 0732   K 4.7 12/16/2013 1305   K 3.9 04/04/2013 0732   CL 103 04/04/2013 0732   CO2 32* 12/16/2013 1305   CO2 25 04/04/2013 0732   GLUCOSE 86 12/16/2013 1305   GLUCOSE 136* 04/04/2013 0732   BUN 16.6 12/16/2013 1305   BUN 12 04/04/2013 0732   CREATININE 0.8 12/16/2013 1305   CREATININE 0.72 04/04/2013 0732   CALCIUM 9.9 12/16/2013 1305   CALCIUM 9.3 04/04/2013 0732   PROT 8.0 12/16/2013 1305   PROT 7.1 04/30/2010 0406   ALBUMIN 4.0 12/16/2013 1305   ALBUMIN 2.7* 04/30/2010 0406   AST 20 12/16/2013 1305   AST 31 04/30/2010 0406   ALT 13 12/16/2013 1305   ALT 30 04/30/2010 0406   ALKPHOS 84 12/16/2013 1305   ALKPHOS 75 04/30/2010 0406   BILITOT 0.45 12/16/2013 1305   BILITOT 0.9 04/30/2010 0406   GFRNONAA >90 04/04/2013 0732  GFRAA >90 04/04/2013 0732    No results found for: SPEP, UPEP  Lab Results  Component Value Date   WBC 5.6 12/16/2013   NEUTROABS 4.2 12/16/2013   HGB 13.9 12/16/2013   HCT 42.4 12/16/2013   MCV 91.0 12/16/2013   PLT 167 12/16/2013      Chemistry      Component Value Date/Time   NA 142 12/16/2013 1305   NA 141 04/04/2013 0732   K 4.7 12/16/2013 1305   K 3.9 04/04/2013 0732   CL 103 04/04/2013 0732   CO2 32* 12/16/2013 1305   CO2 25 04/04/2013 0732   BUN 16.6 12/16/2013 1305   BUN 12 04/04/2013 0732   CREATININE 0.8 12/16/2013 1305   CREATININE 0.72 04/04/2013 0732      Component Value Date/Time   CALCIUM 9.9 12/16/2013 1305   CALCIUM 9.3 04/04/2013 0732   ALKPHOS 84 12/16/2013 1305   ALKPHOS 75 04/30/2010 0406   AST 20 12/16/2013 1305   AST 31 04/30/2010 0406   ALT 13 12/16/2013  1305   ALT 30 04/30/2010 0406   BILITOT 0.45 12/16/2013 1305   BILITOT 0.9 04/30/2010 0406       RADIOGRAPHIC STUDIES:I reviewed imaging study with the patient and family I have personally reviewed the radiological images as listed and agreed with the findings in the report.  ASSESSMENT & PLAN:  Tonsil cancer He has significant disease progression. Clinically, he is not symptomatic but I suspect without treatment, he may have widespread metastatic disease in the near future. We have extensive discussion about the role of palliative chemotherapy. He has multiple co-morbidities and tolerated treatment poorly in the past. I will recommend carboplatin/5-FU infusion as salvage chemotherapy. I would recommend 2-3 cycles of treatment before restaging scan. Due to his long distance travel, I will recommend he receive treatment closer to home. I have discussed with the local oncologist and will refer the patient to her for further management.  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 This has severely limit treatment options. His neuropathic pain is well-controlled.    No orders of the defined types were placed in this encounter.   All questions were answered. The patient knows to call the clinic with any problems, questions or concerns. No barriers to learning was detected. I spent 40 minutes counseling the patient face to face. The total time spent in the appointment was 55 minutes and more than 50% was on counseling and review of test results     [facility], [place], MD 12/26/2013 4:17 PM

## 2013-12-27 ENCOUNTER — Encounter (HOSPITAL_COMMUNITY): Payer: Self-pay | Admitting: Dentistry

## 2013-12-27 NOTE — Progress Notes (Signed)
To provide support and encouragement, care continuity and to assess for needs, met with patient and his family during Est Pt appt with Dr. Alvy Bimler. 1. Dr. Alvy Bimler discussed results of 12/16/13 PET. Patient and family verbalized understanding of disease progression, survival timeframe with and without additional chemo. 2. They expressed desire to proceed with additional chemo.  Dr. Alvy Bimler offered to transfer his care to Dr. Henrietta Hoover at [facility] to minimize their travel, they accepted. 3. Following time with Dr. Alvy Bimler, I reviewed with them points of discussion, care options.  I encouraged them to consider hospice pending patient's prognosis after chemo.  They have family experience with hospice, appreciate the services and support provided.   4. In response to inquiry, they understand that my navigation will be minimal with transfer of care to [facility].  They understand I will continue to be available for support as I am able.  Gayleen Orem, RN, BSN, [place] at [place] [phone]

## 2013-12-29 ENCOUNTER — Encounter (HOSPITAL_COMMUNITY): Payer: Self-pay | Admitting: Dentistry

## 2014-01-18 ENCOUNTER — Encounter (HOSPITAL_COMMUNITY): Payer: 59 | Attending: Hematology & Oncology | Admitting: Hematology & Oncology

## 2014-01-18 ENCOUNTER — Encounter (HOSPITAL_COMMUNITY): Payer: Self-pay | Admitting: Hematology & Oncology

## 2014-01-18 VITALS — BP 113/70 | HR 78 | Temp 97.8°F | Resp 18 | Ht 74.0 in | Wt 201.5 lb

## 2014-01-18 DIAGNOSIS — C7802 Secondary malignant neoplasm of left lung: Secondary | ICD-10-CM | POA: Insufficient documentation

## 2014-01-18 DIAGNOSIS — C099 Malignant neoplasm of tonsil, unspecified: Secondary | ICD-10-CM | POA: Insufficient documentation

## 2014-01-18 DIAGNOSIS — Z923 Personal history of irradiation: Secondary | ICD-10-CM | POA: Insufficient documentation

## 2014-01-18 DIAGNOSIS — Z9221 Personal history of antineoplastic chemotherapy: Secondary | ICD-10-CM

## 2014-01-18 DIAGNOSIS — B977 Papillomavirus as the cause of diseases classified elsewhere: Secondary | ICD-10-CM

## 2014-01-18 DIAGNOSIS — C7801 Secondary malignant neoplasm of right lung: Secondary | ICD-10-CM | POA: Insufficient documentation

## 2014-01-18 DIAGNOSIS — R918 Other nonspecific abnormal finding of lung field: Secondary | ICD-10-CM

## 2014-01-18 NOTE — Progress Notes (Signed)
[facility] CONSULT NOTE  Patient Care Team: Sharilyn Sites, MD as PCP - General (Family Medicine) Brooks Sailors, RN as Registered Nurse (Oncology) Heath Lark, MD as Consulting Physician (Hematology and Oncology) Eppie Gibson, MD as Attending Physician (Radiation Oncology) Molli Hazard, MD as Consulting Physician (Internal Medicine)  CHIEF COMPLAINTS/PURPOSE OF CONSULTATION:  Squamous Cell Carcinoma of the right tonsil, p16+, T3N2cM0, Stage IVA  Right tonsil and bilateral neck / 70 Gy in 35 fractions to gross disease, 63 Gy in 35 fractions to high risk nodal echelons, and 56 Gy in 35 fractions to intermediate risk nodal echelons 05/02/2013-06/22/2013   PET/CT on 10/18/2013 with interval mixed response to therapy, resolution of hypermetabolic tumor within the neck, interval development of hypermetabolic right hilar adenopathy and multi-focal pulmonary nodules  PET/CT 12/16/2013 with asymmetric hypermetabolism within the left tongue base/floor mouth without definite CT correlate, progressive pulmonary metastatic disease, asymmetrically enlarged and heterogeneous left lobe thyroid  HISTORY OF PRESENTING ILLNESS:  [name] 64 y.o. male is here because of recurrent head and neck cancer.  Except for his diabetic neuropathy he is out and about. He states the warm weather has enabled him to be out and working in the yard. He denies cough, fever or SOB. He states he was unable to complete his chemotherapy during his radiation.   His feeding tube is out. He eats everything by mouth. He has no major complaints today.  He wonders why he feels so good if his cancer is back.  Oncology History   Tonsil cancer, HPV positive   Primary site: Pharynx - Oropharynx (Right)   Staging method: AJCC 7th Edition   Clinical free text: HPV positive   Clinical: Stage IVA (T3, N2c, M0) signed by Heath Lark, MD on 04/20/2013  9:29 PM   Summary: Stage IVA (T3, N2c, M0)       Tonsil cancer    02/01/2013 Imaging Ultrasound of the neck revealed bilateral lymphadenopathy in the submandibular region   03/16/2013 Imaging CT scan of the neck show large right tonsil mass measured 3.9 cm in maximum dimension as well as bilateral lymphadenopathy, the largest lymph node measures 31 mm. There is also a left thyroid mass measured 44 mm   03/21/2013 Procedure The patient was seen by ENT with laryngoscopy and biopsy. Pathology is pending   03/25/2013 Imaging PET scan showed large hypermetabolic soft tissue mass in the region of the right palatine tonsil with bilateral cervical hypermetabolic lymphadenopathy indicative of metastatic disease,   04/04/2013 Surgery He underwent placement of Port-A-Cath and feeding tube   04/14/2013 Procedure The patient underwent teeth extraction.   05/02/2013 - 06/13/2013 Chemotherapy The dosage of chemotherapy was interrupted many times due to side effects. His treatment is terminated early due to severe ulcerated skin toxicity.   05/02/2013 - 06/22/2013 Radiation Therapy The patient completed radiation treatment.   10/18/2013 Imaging PET CT scan showed complete response in the oropharynx. However, he has evidence of new bilateral pulmonary metastasis.   12/16/2013 Imaging Repeat PET CT scan show significant progression of pulmonary metastases     MEDICAL HISTORY:  Past Medical History  Diagnosis Date  . Chest pain     10/14  . Urethral stricture     s/p dilitation  . Neuropathy     compression neuropathy right hip;s/p replacement  . Allergy   . Fibromyalgia   . Anxiety   . Depression   . Constipation   . Arthritis     knees, HIps,  Hands  . Tonsillar cancer   . Type II diabetes mellitus   . Pneumonia 2012  . Complication of anesthesia     bleeding during intubation 04/04/13 due to friability of right tonsillar cancer  . PEG (percutaneous endoscopic gastrostomy) status   . S/P radiation therapy 05/02/2013-06/22/2013    70 Gray - Squamous Cell Carcinoma of the tonsil,  p16+, T3N2cM0    SURGICAL HISTORY: Past Surgical History  Procedure Laterality Date  . Total hip arthroplasty Right 2000    Dr. Percell Miller  . Cystoscopy      Dr. Karsten Ro  . Portacath placement Right 04/04/2013  . Gastrostomy tube placement  04/04/2013  . Portacath placement N/A 04/04/2013    Procedure: INSERTION PORT-A-CATH;  Surgeon: Ralene Ok, MD;  Location: [facility];  Service: General;  Laterality: N/A;  . Laparoscopic gastrostomy N/A 04/04/2013    Procedure: LAPAROSCOPIC GASTROSTOMY TUBE PLACEMENT ;  Surgeon: Ralene Ok, MD;  Location: [place];  Service: General;  Laterality: N/A;  . Nasal hemorrhage control N/A 04/04/2013    Procedure: Control of oropharyngeal hemorrhage;  Surgeon: Ascencion Dike, MD;  Location: [facility] OR;  Service: ENT;  Laterality: N/A;  . Multiple extractions with alveoloplasty N/A 04/14/2013    Procedure: Extraction of tooth #'s 1,2,3,4,5,6,7,8,9,10,11,12,13,14,15,17,18,19,20,21,22,23,24,25,26,27,28,29, 30, 31, and 32 with alveoloplasty and bilateral mandibular tori reductions.;  Surgeon: Lenn Cal, DDS;  Location: [place];  Service: Oral Surgery;  Laterality: N/A;    SOCIAL HISTORY: History   Social History  . Marital Status: Married    Spouse Name: N/A    Number of Children: N/A  . Years of Education: N/A   Occupational History  . Not on file.   Social History Main Topics  . Smoking status: Never Smoker   . Smokeless tobacco: Never Used  . Alcohol Use: No     Comment: Occasional beer  . Drug Use: No  . Sexual Activity: Yes   Other Topics Concern  . Not on file   Social History Narrative  Married for 20 years. They have no children. They have a dog. He has a daughter from a prior relationship, they are estranged. No smoking, worked at [employer]. He has never smoked. Occasional alcohol.  FAMILY HISTORY: Family History  Problem Relation Age of Onset  . Hyperlipidemia Brother   . Cancer Mother     Deceased with leukemia, had uterine ca  .  Cancer Cousin     living brain cancer, male   indicated that his mother is deceased. He indicated that his father is deceased. He indicated that his cousin is alive.   His mother had cervical cancer and leukemia. 1 brother who is healthy.  ALLERGIES:  is allergic to neurontin and dilaudid.  MEDICATIONS:  Current Outpatient Prescriptions  Medication Sig Dispense Refill  . acetaminophen (TYLENOL) 500 MG tablet Take 500-1,000 mg by mouth every 4 (four) hours as needed for mild pain.     [name] alprazolam (XANAX) 2 MG tablet Take 2 mg by mouth 3 (three) times daily as needed for sleep or anxiety.     . Cholecalciferol (VITAMIN D3) 5000 UNITS CAPS Take 5,000 Units by mouth daily.    . citalopram (CELEXA) 20 MG tablet Take 20 mg by mouth daily.    . diphenhydrAMINE (BENADRYL) 25 mg capsule Take 1 capsule (25 mg total) by mouth every 6 (six) hours as needed for itching. 60 capsule 0  . docusate sodium (COLACE) 100 MG capsule Take 100 mg by mouth 2 (  two) times daily as needed for mild constipation.    . ondansetron (ZOFRAN) 8 MG tablet Take 1 tablet (8 mg total) by mouth every 8 (eight) hours as needed for nausea. 60 tablet 3  . OxyCODONE (OXYCONTIN) 10 mg T12A 12 hr tablet Take 10 mg by mouth every 12 (twelve) hours.    [name] oxyCODONE-acetaminophen (PERCOCET) 10-325 MG per tablet Take 1 tablet by mouth every 4 (four) hours as needed for pain.    . pregabalin (LYRICA) 150 MG capsule Take 2 capsules (300 mg total) by mouth 2 (two) times daily. (Patient taking differently: Take 300 mg by mouth 2 (two) times daily. 2 in am and 2 at night) 60 capsule 3  . promethazine (PHENERGAN) 25 MG tablet Take 1 tablet (25 mg total) by mouth every 6 (six) hours as needed for nausea. 60 tablet 3  . zolpidem (AMBIEN CR) 6.25 MG CR tablet Take 6.25 mg by mouth at bedtime as needed for sleep.    [name] CISPLATIN IV Inject into the vein. Weekly starting 02/07/14    . lidocaine-prilocaine (EMLA) cream Apply a quarter size amount to  port site 1 hour prior to chemo. Do not rub in. Cover with plastic wrap. 30 g 3  . PACLitaxel (TAXOL IV) Inject into the vein. Weekly starting 02/07/14     No current facility-administered medications for this visit.    Review of Systems  Constitutional: Negative.   HENT: Negative.        Denies choking or difficulty with swallowing. Has new false teeth/dentures  Eyes: Negative.   Respiratory: Negative.   Cardiovascular: Negative.   Gastrointestinal: Negative.   Genitourinary: Negative.   Skin: Negative.   Neurological: Positive for tingling, sensory change and speech change. Negative for dizziness, tremors, focal weakness, seizures and loss of consciousness.  Endo/Heme/Allergies: Negative.   Psychiatric/Behavioral: Negative for depression, suicidal ideas, hallucinations, memory loss and substance abuse. The patient is nervous/anxious and has insomnia.     PHYSICAL EXAMINATION:  ECOG PERFORMANCE STATUS: 0 - Asymptomatic  Filed Vitals:   01/18/14 1342  BP: 113/70  Pulse: 78  Temp: 97.8 F (36.6 C)  Resp: 18   Filed Weights   01/18/14 1342  Weight: 201 lb 8 oz (91.4 kg)     Physical Exam  Constitutional: He is oriented to person, place, and time and well-developed, well-nourished, and in no distress.  HENT:  Head: Normocephalic and atraumatic.  Nose: Nose normal.  Mouth/Throat: Oropharynx is clear and moist. No oropharyngeal exudate.  XRT changes to the neck, mild edema noted  Eyes: Conjunctivae and EOM are normal. Pupils are equal, round, and reactive to light. Right eye exhibits no discharge. Left eye exhibits no discharge. No scleral icterus.  Neck: Normal range of motion. Neck supple. No tracheal deviation present. No thyromegaly present.  Cardiovascular: Normal rate, regular rhythm and normal heart sounds.  Exam reveals no gallop and no friction rub.   No murmur heard. Pulmonary/Chest: Effort normal and breath sounds normal. He has no wheezes. He has no rales.   Coarse rhonchi that clears with a cough  Abdominal: Soft. Bowel sounds are normal. He exhibits no distension and no mass. There is no tenderness. There is no rebound and no guarding.  Musculoskeletal: Normal range of motion. He exhibits no edema.  Lymphadenopathy:    He has no cervical adenopathy.  Neurological: He is alert and oriented to person, place, and time. He has normal reflexes. No cranial nerve deficit. Gait normal. Coordination normal.  Skin:  Skin is warm and dry. No rash noted.  Psychiatric: Mood, memory, affect and judgment normal.  Nursing note and vitals reviewed.    LABORATORY DATA:  I have reviewed the data as listed Lab Results  Component Value Date   WBC 5.2 01/25/2014   HGB 13.7 01/25/2014   HCT 40.4 01/25/2014   MCV 90.2 01/25/2014   PLT 138* 01/25/2014     Chemistry      Component Value Date/Time   NA 142 12/16/2013 1305   NA 141 04/04/2013 0732   K 4.7 12/16/2013 1305   K 3.9 04/04/2013 0732   CL 103 04/04/2013 0732   CO2 32* 12/16/2013 1305   CO2 25 04/04/2013 0732   BUN 16.6 12/16/2013 1305   BUN 12 04/04/2013 0732   CREATININE 0.8 12/16/2013 1305   CREATININE 0.72 04/04/2013 0732      Component Value Date/Time   CALCIUM 9.9 12/16/2013 1305   CALCIUM 9.3 04/04/2013 0732   ALKPHOS 84 12/16/2013 1305   ALKPHOS 75 04/30/2010 0406   AST 20 12/16/2013 1305   AST 31 04/30/2010 0406   ALT 13 12/16/2013 1305   ALT 30 04/30/2010 0406   BILITOT 0.45 12/16/2013 1305   BILITOT 0.9 04/30/2010 0406       ASSESSMENT & PLAN:  Tonsil cancer Is an 64 year old male, nonsmoker, who presented with stage IV A HPV-positive squamous cell carcinoma of the tonsil. He was treated with concurrent radiation and Erbitux, chemotherapy was interrupted multiple times secondary to side effects. Imaging studies since completion of his therapy are highly suggestive of recurrent disease. He and his wife are having difficulty accepting that his cancer has come back.  They  have had extensive conversations with Dr. Alvy Bimler. We discussed obtaining a biopsy of his pulmonary nodules. They feel like if the biopsy was "positive" they could be more accepting. They do not understand how his cancer could be back because he is "doing so well."  I advised him I will address this possibility with interventional radiology. If they are agreeable that he is a good candidate for biopsy, that is how we will proceed.  He is very fearful of additional treatment because of his prior experience. I advised Daune that a lot of the difficulties with his prior therapy was from the combination of chemotherapy and radiation. I advised him there are treatments he could take that woul potentially prolong his life and keep him feeling well for a longer period. We reviewed the NCCN guidelines. I will bring him back post-biopsy to review his final pathology and discuss treatment options should he elect to pursue them.    Orders Placed This Encounter  Procedures  . CT Biopsy    Standing Status: Future     Number of Occurrences: 1     Standing Expiration Date: 01/18/2015    Order Specific Question:  Lab orders requested (DO NOT place separate lab orders, these will be automatically ordered during procedure specimen collection):    Answer:  Surgical Pathology    Order Specific Question:  Reason for Exam (SYMPTOM  OR DIAGNOSIS REQUIRED)    Answer:  head and neck cancer, lung metastases per PET, please biopsy lung lesion    Order Specific Question:  Preferred imaging location?    Answer:  [facility]    All questions were answered. The patient knows to call the clinic with any problems, questions or concerns.   Molli Hazard, MD MD 02/07/2014 7:32 AM

## 2014-01-18 NOTE — Patient Instructions (Signed)
[facility] Discharge Instructions  RECOMMENDATIONS MADE BY THE CONSULTANT AND ANY TEST RESULTS WILL BE SENT TO YOUR REFERRING PHYSICIAN.  We are setting you up for a biopsy of one of the lung nodules I will see you back after the biopsy to review the results and make additional recommendations at that time If you have any problems or concerns prior to follow-up please call  Thank you for choosing [facility] to provide your oncology and hematology care.  To afford each patient quality time with our providers, please arrive at least 15 minutes before your scheduled appointment time.  With your help, our goal is to use those 15 minutes to complete the necessary work-up to ensure our physicians have the information they need to help with your evaluation and healthcare recommendations.    Effective January 1st, 2014, we ask that you re-schedule your appointment with our physicians should you arrive 10 or more minutes late for your appointment.  We strive to give you quality time with our providers, and arriving late affects you and other patients whose appointments are after yours.    Again, thank you for choosing [facility].  Our hope is that these requests will decrease the amount of time that you wait before being seen by our physicians.       _____________________________________________________________  Should you have questions after your visit to [facility], please contact our office at [phone] between the hours of 8:30 a.m. and 5:00 p.m.  Voicemails left after 4:30 p.m. will not be returned until the following business day.  For prescription refill requests, have your pharmacy contact our office with your prescription refill request.

## 2014-01-23 ENCOUNTER — Other Ambulatory Visit: Payer: Self-pay | Admitting: Radiology

## 2014-01-24 ENCOUNTER — Other Ambulatory Visit: Payer: Self-pay | Admitting: Radiology

## 2014-01-25 ENCOUNTER — Ambulatory Visit (HOSPITAL_COMMUNITY)
Admission: RE | Admit: 2014-01-25 | Discharge: 2014-01-25 | Disposition: A | Discharge status: Home / Self Care | Payer: 59 | Source: Ambulatory Visit | Attending: Hematology & Oncology | Admitting: Hematology & Oncology

## 2014-01-25 ENCOUNTER — Encounter (HOSPITAL_COMMUNITY): Payer: Self-pay

## 2014-01-25 ENCOUNTER — Ambulatory Visit (HOSPITAL_COMMUNITY)
Admission: RE | Admit: 2014-01-25 | Discharge: 2014-01-25 | Disposition: A | Discharge status: Home / Self Care | Payer: 59 | Source: Ambulatory Visit | Attending: Interventional Radiology | Admitting: Interventional Radiology

## 2014-01-25 DIAGNOSIS — C099 Malignant neoplasm of tonsil, unspecified: Secondary | ICD-10-CM

## 2014-01-25 DIAGNOSIS — C7801 Secondary malignant neoplasm of right lung: Secondary | ICD-10-CM | POA: Insufficient documentation

## 2014-01-25 DIAGNOSIS — C7802 Secondary malignant neoplasm of left lung: Secondary | ICD-10-CM | POA: Diagnosis not present

## 2014-01-25 DIAGNOSIS — R911 Solitary pulmonary nodule: Secondary | ICD-10-CM

## 2014-01-25 LAB — CBC
HEMATOCRIT: 40.4 % (ref 39.0–52.0)
HEMOGLOBIN: 13.7 g/dL (ref 13.0–17.0)
MCH: 30.6 pg (ref 26.0–34.0)
MCHC: 33.9 g/dL (ref 30.0–36.0)
MCV: 90.2 fL (ref 78.0–100.0)
Platelets: 138 10*3/uL — ABNORMAL LOW (ref 150–400)
RBC: 4.48 MIL/uL (ref 4.22–5.81)
RDW: 15 % (ref 11.5–15.5)
WBC: 5.2 10*3/uL (ref 4.0–10.5)

## 2014-01-25 LAB — PROTIME-INR
INR: 1.16 (ref 0.00–1.49)
Prothrombin Time: 15 seconds (ref 11.6–15.2)

## 2014-01-25 LAB — GLUCOSE, CAPILLARY: GLUCOSE-CAPILLARY: 72 mg/dL (ref 70–99)

## 2014-01-25 MED ORDER — FENTANYL CITRATE 0.05 MG/ML IJ SOLN
INTRAMUSCULAR | Status: AC | PRN
  Administered 2014-01-25: 25 ug via INTRAVENOUS

## 2014-01-25 MED ORDER — MIDAZOLAM HCL 2 MG/2ML IJ SOLN
INTRAMUSCULAR | Status: AC | PRN
  Administered 2014-01-25: 1 mg via INTRAVENOUS

## 2014-01-25 MED ORDER — SODIUM CHLORIDE 0.9 % IV SOLN
INTRAVENOUS | Status: DC
  Administered 2014-01-25: 10:00:00 via INTRAVENOUS

## 2014-01-25 MED ORDER — LIDOCAINE HCL 1 % IJ SOLN
INTRAMUSCULAR | Status: AC
  Filled 2014-01-25: qty 20

## 2014-01-25 MED ORDER — FENTANYL CITRATE 0.05 MG/ML IJ SOLN
INTRAMUSCULAR | Status: AC
  Filled 2014-01-25: qty 4

## 2014-01-25 MED ORDER — MIDAZOLAM HCL 2 MG/2ML IJ SOLN
INTRAMUSCULAR | Status: AC
  Filled 2014-01-25: qty 4

## 2014-01-25 NOTE — Sedation Documentation (Signed)
Awaiting chest xray in radiology before transferring to [facility]

## 2014-01-25 NOTE — Sedation Documentation (Signed)
Pt returned from [facility]- will transfer to [facility]

## 2014-01-25 NOTE — Sedation Documentation (Signed)
Family at bedside. Report given to [name]. Awaiting short stay.

## 2014-01-25 NOTE — Sedation Documentation (Signed)
Taken to the nurses station by [employer] as no room in [facility]

## 2014-01-25 NOTE — Sedation Documentation (Signed)
To diagnostic now for XRAY.

## 2014-01-25 NOTE — Procedures (Signed)
CT biopsy RUL lung nodule 18g x2 to surg path No complication No blood loss. See complete dictation in [facility].

## 2014-01-25 NOTE — H&P (Signed)
Chief Complaint: Pulmonary nodules- progressing   Referring Physician(s): Penland,Shannon Kristen  History of Present Illness: [name] is a 64 y.o. male  Pt with Hx tonsillar Ca- Sq cell Ca Last radiation 06/2013 Follow up PET 10/2013 shows resolution of head/neck lesion but with interval development small B pulmonary nodules 12/2013 PET reveals progression of pulmonary nodules After review of imaging- Dr Vernard Gambles has approved Bx of Rt apical mass   Past Medical History  Diagnosis Date  . Chest pain     10/14  . Urethral stricture     s/p dilitation  . Neuropathy     compression neuropathy right hip;s/p replacement  . Allergy   . Fibromyalgia   . Anxiety   . Depression   . Constipation   . Arthritis     knees, HIps, Hands  . Tonsillar cancer   . Type II diabetes mellitus   . Pneumonia 2012  . Complication of anesthesia     bleeding during intubation 04/04/13 due to friability of right tonsillar cancer  . PEG (percutaneous endoscopic gastrostomy) status   . 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 Total hip arthroplasty Right 2000    Dr. Percell Miller  . Cystoscopy      Dr. Karsten Ro  . Portacath placement Right 04/04/2013  . Gastrostomy tube placement  04/04/2013  . Portacath placement N/A 04/04/2013    Procedure: INSERTION PORT-A-CATH;  Surgeon: Ralene Ok, MD;  Location: [place];  Service: General;  Laterality: N/A;  . Laparoscopic gastrostomy N/A 04/04/2013    Procedure: LAPAROSCOPIC GASTROSTOMY TUBE PLACEMENT ;  Surgeon: Ralene Ok, MD;  Location: [place];  Service: General;  Laterality: N/A;  . Nasal hemorrhage control N/A 04/04/2013    Procedure: Control of oropharyngeal hemorrhage;  Surgeon: Ascencion Dike, MD;  Location: [facility] OR;  Service: ENT;  Laterality: N/A;  . Multiple extractions with alveoloplasty N/A 04/14/2013    Procedure: Extraction of  tooth #'s 1,2,3,4,5,6,7,8,9,10,11,12,13,14,15,17,18,19,20,21,22,23,24,25,26,27,28,29, 30, 31, and 32 with alveoloplasty and bilateral mandibular tori reductions.;  Surgeon: Lenn Cal, DDS;  Location: [place];  Service: Oral Surgery;  Laterality: N/A;    Allergies: Neurontin and Dilaudid  Medications: Prior to Admission medications   Medication Sig Start Date End Date Taking? Authorizing Provider  acetaminophen (TYLENOL) 500 MG tablet Take 500-1,000 mg by mouth every 4 (four) hours as needed for mild pain.    Yes Historical Provider, MD  alprazolam Duanne Moron) 2 MG tablet Take 2 mg by mouth 3 (three) times daily as needed for sleep or anxiety.    Yes Historical Provider, MD  Cholecalciferol (VITAMIN D3) 5000 UNITS CAPS Take 5,000 Units by mouth daily.   Yes Historical Provider, MD  citalopram (CELEXA) 20 MG tablet Take 20 mg by mouth daily.   Yes Historical Provider, MD  diphenhydrAMINE (BENADRYL) 25 mg capsule Take 1 capsule (25 mg total) by mouth every 6 (six) hours as needed for itching. 05/20/13  Yes Heath Lark, MD  docusate sodium (COLACE) 100 MG capsule Take 100 mg by mouth 2 (two) times daily as needed for mild constipation.   Yes Historical Provider, MD  lidocaine-prilocaine (EMLA) cream Apply 1 application topically as needed. 04/28/13  Yes Heath Lark, MD  ondansetron (ZOFRAN) 8 MG tablet Take 1 tablet (8 mg total) by mouth every 8 (eight) hours as needed for nausea. 12/26/13  Yes Ni Alvy Bimler,  MD  OxyCODONE (OXYCONTIN) 10 mg T12A 12 hr tablet Take 10 mg by mouth every 12 (twelve) hours.   Yes Historical Provider, MD  oxyCODONE-acetaminophen (PERCOCET) 10-325 MG per tablet Take 1 tablet by mouth every 4 (four) hours as needed for pain.   Yes Historical Provider, MD  pregabalin (LYRICA) 300 MG capsule Take 1 capsule (300 mg total) by mouth 2 (two) times daily. 09/02/13  Yes Heath Lark, MD  zolpidem (AMBIEN CR) 6.25 MG CR tablet Take 6.25 mg by mouth at bedtime as needed for sleep.   Yes  Historical Provider, MD  pregabalin (LYRICA) 150 MG capsule Take 2 capsules (300 mg total) by mouth 2 (two) times daily. Patient taking differently: Take 300 mg by mouth 2 (two) times daily. 1 in am and 2 at night 09/02/13   Heath Lark, MD  promethazine (PHENERGAN) 25 MG tablet Take 1 tablet (25 mg total) by mouth every 6 (six) hours as needed for nausea. Patient not taking: Reported on 01/25/2014 12/26/13   Heath Lark, MD    Family History  Problem Relation Age of Onset  . Hyperlipidemia Brother   . Cancer Mother     Deceased with leukemia, had uterine ca  . Cancer Cousin     living brain cancer, male    History   Social History  . Marital Status: Married    Spouse Name: N/A    Number of Children: N/A  . Years of Education: N/A   Social History Main Topics  . Smoking status: Never Smoker   . Smokeless tobacco: Never Used  . Alcohol Use: No     Comment: Occasional beer  . Drug Use: No  . Sexual Activity: Yes   Other Topics Concern  . None   Social History Narrative    Review of Systems: A 12 point ROS discussed and pertinent positives are indicated in the HPI above.  All other systems are negative.  Review of Systems  Constitutional: Positive for appetite change. Negative for fever, activity change and fatigue.  HENT: Positive for sore throat and voice change.   Respiratory: Negative for cough and shortness of breath.   Cardiovascular: Negative for chest pain.  Gastrointestinal: Negative for nausea and vomiting.  Musculoskeletal: Negative for back pain.  Neurological: Positive for weakness.  Psychiatric/Behavioral: Negative for behavioral problems and confusion.    Vital Signs: BP 120/77 mmHg  Pulse 72  Temp(Src) 97.7 F (36.5 C)  Resp 18  Ht 6\' 4"  (1.93 m)  Wt 91.173 kg (201 lb)  BMI 24.48 kg/m2  SpO2 99%  Physical Exam  Constitutional: He is oriented to person, place, and time. He appears well-nourished.  HENT:  Obvious difficulty with speech-  pronunciation difficulty  Cardiovascular: Normal rate, regular rhythm and normal heart sounds.   Pulmonary/Chest: Effort normal and breath sounds normal. He has no wheezes.  Abdominal: Soft. Bowel sounds are normal.  Musculoskeletal: Normal range of motion.  Neurological: He is alert and oriented to person, place, and time.  Skin: Skin is warm and dry.  Psychiatric: He has a normal mood and affect. His behavior is normal. Judgment and thought content normal.  Nursing note and vitals reviewed.   Imaging: No results found.  Labs:  CBC:  Recent Labs  06/10/13 0843 06/30/13 0948 10/18/13 0938 12/16/13 1304  WBC 8.9 6.2 5.5 5.6  HGB 12.9* 13.0 13.6 13.9  HCT 39.2 40.9 42.0 42.4  PLT 215 271 140 167    COAGS: No results for input(s): INR,  APTT in the last 8760 hours.  BMP:  Recent Labs  04/04/13 0732  06/10/13 0843 06/30/13 0948 10/18/13 0938 12/16/13 1305  NA 141  < > 139 140 143 142  K 3.9  < > 4.5 4.1 3.9 4.7  CL 103  --   --   --   --   --   CO2 25  < > 28 30* 29 32*  GLUCOSE 136*  < > 150* 157* 98 86  BUN 12  < > 9.4 8.7 11.5 16.6  CALCIUM 9.3  < > 9.6 9.8 9.7 9.9  CREATININE 0.72  < > 0.7 0.7 0.7 0.8  GFRNONAA >90  --   --   --   --   --   GFRAA >90  --   --   --   --   --   < > = values in this interval not displayed.  LIVER FUNCTION TESTS:  Recent Labs  04/28/13 1132 10/18/13 0938 12/16/13 1305  BILITOT 0.37 0.50 0.45  AST 69* 18 20  ALT 41 10 13  ALKPHOS 84 81 84  PROT 8.4* 8.0 8.0  ALBUMIN 3.8 3.6 4.0    TUMOR MARKERS: No results for input(s): AFPTM, CEA, CA199, CHROMGRNA in the last 8760 hours.  Assessment and Plan:  Hx tonsillar cancer Routine follow up with PET shows new development pulmonary nodules 10/2013 PET 12/2013 shows progression and hypermetabolic Now scheduled for Rt lung apical mass bx Pt and wife aware of procedure benefits and risks and agreeable to proceed Consent signed andin chart  Thank you for this interesting  consult.  I greatly enjoyed meeting [name] and look forward to participating in their care.     I spent a total of 20 minutes face to face in clinical consultation, greater than 50% of which was counseling/coordinating care for Rt lung mass bx  Signed: Karon Cotterill A 01/25/2014, 10:19 AM

## 2014-01-25 NOTE — Sedation Documentation (Signed)
bandaid clean, dry, intact to R upper back

## 2014-01-25 NOTE — Discharge Instructions (Signed)
Needle Biopsy of Lung, Care After °Refer to this sheet in the next few weeks. These instructions provide you with information on caring for yourself after your procedure. Your health care provider may also give you more specific instructions. Your treatment has been planned according to current medical practices, but problems sometimes occur. Call your health care provider if you have any problems or questions after your procedure. °WHAT TO EXPECT AFTER THE PROCEDURE °· A bandage will be applied over the area where the needle was inserted. You may be asked to apply pressure to the bandage for several minutes to ensure there is minimal bleeding. °· In most cases, you can leave when your needle biopsy procedure is completed. Do not drive yourself home. Someone else should take you home. °· If you received an IV sedative or general anesthetic, you will be taken to a comfortable place to relax while the medicine wears off. °· If you have upcoming travel scheduled, talk to your health care provider about when it is safe to travel by air after the procedure. °HOME CARE INSTRUCTIONS °· Expect to take it easy for the rest of the day. °· Protect the area where you received the needle biopsy by keeping the bandage in place for as long as instructed. °· You may feel some mild pain or discomfort in the area, but this should stop in a day or two. °· Take medicines only as directed by your health care provider. °SEEK MEDICAL CARE IF:  °· You have pain at the biopsy site that worsens or is not helped by medicine. °· You have swelling or drainage at the needle biopsy site. °· You have a fever. °SEEK IMMEDIATE MEDICAL CARE IF:  °· You have new or worsening shortness of breath. °· You have chest pain. °· You are coughing up blood. °· You have bleeding that does not stop with pressure or a bandage. °· You develop light-headedness or fainting. °Document Released: 10/27/2006 Document Revised: 05/16/2013 Document Reviewed:  05/24/2012 °ExitCare® Patient Information ©2015 [employer]. This information is not intended to replace advice given to you by your health care provider. Make sure you discuss any questions you have with your health care provider. ° °

## 2014-01-25 NOTE — Sedation Documentation (Signed)
Transferred to stretcher, kept on right side lying as requested by Dr Vernard Gambles. No hemoptysis. Denies pain.

## 2014-01-31 ENCOUNTER — Encounter (HOSPITAL_BASED_OUTPATIENT_CLINIC_OR_DEPARTMENT_OTHER): Payer: 59 | Admitting: Hematology & Oncology

## 2014-01-31 ENCOUNTER — Encounter (HOSPITAL_COMMUNITY): Payer: Self-pay | Admitting: Hematology & Oncology

## 2014-01-31 VITALS — BP 114/78 | HR 88 | Temp 98.4°F | Resp 16 | Ht 74.5 in | Wt 202.7 lb

## 2014-01-31 DIAGNOSIS — C099 Malignant neoplasm of tonsil, unspecified: Secondary | ICD-10-CM

## 2014-01-31 DIAGNOSIS — B977 Papillomavirus as the cause of diseases classified elsewhere: Secondary | ICD-10-CM

## 2014-01-31 DIAGNOSIS — C7800 Secondary malignant neoplasm of unspecified lung: Secondary | ICD-10-CM

## 2014-01-31 NOTE — Patient Instructions (Signed)
[place] at [facility] Discharge Instructions  RECOMMENDATIONS MADE BY THE CONSULTANT AND ANY TEST RESULTS WILL BE SENT TO YOUR REFERRING PHYSICIAN.  [name] to do teaching on Monday @ 10  (Jan 25)  [name] will call Sunday pm to confirm Monday appt  Chemo to start on Tuesday @ 9:45 (Jan 26)  4 meds to be called into [facility] and they will deliver    Thank you for choosing [place] at [facility] to provide your oncology and hematology care.  To afford each patient quality time with our provider, please arrive at least 15 minutes before your scheduled appointment time.    You need to re-schedule your appointment should you arrive 10 or more minutes late.  We strive to give you quality time with our providers, and arriving late affects you and other patients whose appointments are after yours.  Also, if you no show three or more times for appointments you may be dismissed from the clinic at the providers discretion.     Again, thank you for choosing [facility].  Our hope is that these requests will decrease the amount of time that you wait before being seen by our physicians.       _____________________________________________________________  Should you have questions after your visit to [facility], please contact our office at [phone] between the hours of 8:30 a.m. and 4:30 p.m.  Voicemails left after 4:30 p.m. will not be returned until the following business day.  For prescription refill requests, have your pharmacy contact our office.

## 2014-01-31 NOTE — Progress Notes (Signed)
[place] NOTE  Patient Care Team: Sharilyn Sites, MD as PCP - General (Family Medicine) Brooks Sailors, RN as Registered Nurse (Oncology) Heath Lark, MD as Consulting Physician (Hematology and Oncology) Eppie Gibson, MD as Attending Physician (Radiation Oncology) Molli Hazard, MD as Consulting Physician (Internal Medicine)  CHIEF COMPLAINTS/PURPOSE OF CONSULTATION:  Squamous Cell Carcinoma of the right tonsil, p16+, T3N2cM0, Stage IVA  Right tonsil and bilateral neck / 70 Gy in 35 fractions to gross disease, 63 Gy in 35 fractions to high risk nodal echelons, and 56 Gy in 35 fractions to intermediate risk nodal echelons 05/02/2013-06/22/2013   Oncology History   Tonsil cancer, HPV positive   Primary site: Pharynx - Oropharynx (Right)   Staging method: AJCC 7th Edition   Clinical free text: HPV positive   Clinical: Stage IVA (T3, N2c, M0) signed by Heath Lark, MD on 04/20/2013  9:29 PM   Summary: Stage IVA (T3, N2c, M0)       Tonsil cancer   02/01/2013 Imaging Ultrasound of the neck revealed bilateral lymphadenopathy in the submandibular region   03/16/2013 Imaging CT scan of the neck show large right tonsil mass measured 3.9 cm in maximum dimension as well as bilateral lymphadenopathy, the largest lymph node measures 31 mm. There is also a left thyroid mass measured 44 mm   03/21/2013 Procedure The patient was seen by ENT with laryngoscopy and biopsy. Pathology is pending   03/25/2013 Imaging PET scan showed large hypermetabolic soft tissue mass in the region of the right palatine tonsil with bilateral cervical hypermetabolic lymphadenopathy indicative of metastatic disease,   04/04/2013 Surgery He underwent placement of Port-A-Cath and feeding tube   04/14/2013 Procedure The patient underwent teeth extraction.   05/02/2013 - 06/13/2013 Chemotherapy The dosage of chemotherapy was interrupted many times due to side effects. His treatment is terminated early due to  severe ulcerated skin toxicity.   05/02/2013 - 06/22/2013 Radiation Therapy The patient completed radiation treatment.   10/18/2013 Imaging PET CT scan showed complete response in the oropharynx. However, he has evidence of new bilateral pulmonary metastasis.   12/16/2013 Imaging Repeat PET CT scan show significant progression of pulmonary metastases    01/25/2014 Pathology Results Squamous Cell Carcinoma, CT guided biopsy of RUL nodule    HISTORY OF PRESENTING ILLNESS:  [name] 64 y.o. 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 He underwent a CT guided biopsy of a RUL pulmonary nodule and is here to review pathology.  He states he feels well. He is anxious about his pathology report.  He is interested in pursuing chemotherapy if "his cancer is back." He does "not want to wear the chemotherapy bag" that he discussed with Dr. Alvy Bimler. He feels that it will inhibit his ability to be active.  MEDICAL HISTORY:  Past Medical History  Diagnosis Date  . Chest pain     10/14  . Urethral stricture     s/p dilitation  . Neuropathy     compression neuropathy right hip;s/p replacement  . Allergy   . Fibromyalgia   . Anxiety   . Depression   . Constipation   . Arthritis     knees, HIps, Hands  . Tonsillar cancer   . Type II diabetes mellitus   . Pneumonia 2012  . Complication of anesthesia     bleeding during intubation 04/04/13 due to friability of right tonsillar cancer  . PEG (percutaneous endoscopic gastrostomy) status   .  S/P radiation therapy 05/02/2013-06/22/2013    70 [facility] - Squamous Cell Carcinoma of the tonsil, p16+, T3N2cM0    SURGICAL HISTORY: Past Surgical History  Procedure Laterality Date  . Total hip arthroplasty Right 2000    Dr. Percell Miller  . Cystoscopy      Dr. Karsten Ro  . Portacath placement Right 04/04/2013  . Gastrostomy tube placement  04/04/2013  . Portacath placement N/A 04/04/2013    Procedure: INSERTION PORT-A-CATH;  Surgeon: Ralene Ok, MD;  Location: [place];  Service: General;  Laterality: N/A;  . Laparoscopic gastrostomy N/A 04/04/2013    Procedure: LAPAROSCOPIC GASTROSTOMY TUBE PLACEMENT ;  Surgeon: Ralene Ok, MD;  Location: [place];  Service: General;  Laterality: N/A;  . Nasal hemorrhage control N/A 04/04/2013    Procedure: Control of oropharyngeal hemorrhage;  Surgeon: Ascencion Dike, MD;  Location: [facility] OR;  Service: ENT;  Laterality: N/A;  . Multiple extractions with alveoloplasty N/A 04/14/2013    Procedure: Extraction of tooth #'s 1,2,3,4,5,6,7,8,9,10,11,12,13,14,15,17,18,19,20,21,22,23,24,25,26,27,28,29, 30, 31, and 32 with alveoloplasty and bilateral mandibular tori reductions.;  Surgeon: Lenn Cal, DDS;  Location: [place];  Service: Oral Surgery;  Laterality: N/A;    SOCIAL HISTORY: History   Social History  . Marital Status: Married    Spouse Name: N/A    Number of Children: N/A  . Years of Education: N/A   Occupational History  . Not on file.   Social History Main Topics  . Smoking status: Never Smoker   . Smokeless tobacco: Never Used  . Alcohol Use: No     Comment: Occasional beer  . Drug Use: No  . Sexual Activity: Yes   Other Topics Concern  . Not on file   Social History Narrative  Married for 20 years. They have no children. They have a dog. He has a daughter from a prior relationship, they are estranged. No smoking, worked at [employer]. He has never smoked. Occasional alcohol.  FAMILY HISTORY: Family History  Problem Relation Age of Onset  . Hyperlipidemia Brother   . Cancer Mother     Deceased with leukemia, had uterine ca  . Cancer Cousin     living brain cancer, male   indicated that his mother is deceased. He indicated that his father is deceased. He indicated that his cousin is alive.   His mother had cervical cancer and leukemia. 1 brother who is healthy.  ALLERGIES:  is allergic to neurontin and dilaudid.  MEDICATIONS:  Current Outpatient Prescriptions   Medication Sig Dispense Refill  . acetaminophen (TYLENOL) 500 MG tablet Take 500-1,000 mg by mouth every 4 (four) hours as needed for mild pain.     [name] alprazolam (XANAX) 2 MG tablet Take 2 mg by mouth 3 (three) times daily as needed for sleep or anxiety.     . Cholecalciferol (VITAMIN D3) 5000 UNITS CAPS Take 5,000 Units by mouth daily.    . citalopram (CELEXA) 20 MG tablet Take 20 mg by mouth daily.    . diphenhydrAMINE (BENADRYL) 25 mg capsule Take 1 capsule (25 mg total) by mouth every 6 (six) hours as needed for itching. 60 capsule 0  . docusate sodium (COLACE) 100 MG capsule Take 100 mg by mouth 2 (two) times daily as needed for mild constipation.    . ondansetron (ZOFRAN) 8 MG tablet Take 1 tablet (8 mg total) by mouth every 8 (eight) hours as needed for nausea. 60 tablet 3  . OxyCODONE (OXYCONTIN) 10 mg T12A 12 hr tablet Take  10 mg by mouth every 12 (twelve) hours.    [name] oxyCODONE-acetaminophen (PERCOCET) 10-325 MG per tablet Take 1 tablet by mouth every 4 (four) hours as needed for pain.    . pregabalin (LYRICA) 150 MG capsule Take 2 capsules (300 mg total) by mouth 2 (two) times daily. (Patient taking differently: Take 300 mg by mouth 2 (two) times daily. 2 in am and 2 at night) 60 capsule 3  . promethazine (PHENERGAN) 25 MG tablet Take 1 tablet (25 mg total) by mouth every 6 (six) hours as needed for nausea. 60 tablet 3  . zolpidem (AMBIEN CR) 6.25 MG CR tablet Take 6.25 mg by mouth at bedtime as needed for sleep.    [name] CISPLATIN IV Inject into the vein. Weekly starting 02/07/14    . lidocaine-prilocaine (EMLA) cream Apply a quarter size amount to port site 1 hour prior to chemo. Do not rub in. Cover with plastic wrap. 30 g 3  . PACLitaxel (TAXOL IV) Inject into the vein. Weekly starting 02/07/14     No current facility-administered medications for this visit.   Facility-Administered Medications Ordered in Other Visits  Medication Dose Route Frequency Provider Last Rate Last Dose  .  sodium chloride 0.9 % injection 10 mL  10 mL Intracatheter PRN Molli Hazard, MD   10 mL at 02/07/14 1030    Review of Systems  Constitutional: Negative for fever, chills, weight loss and malaise/fatigue.  HENT: Negative for congestion, hearing loss, nosebleeds, sore throat and tinnitus.   Eyes: Negative for blurred vision, double vision, pain and discharge.  Respiratory: Negative for cough, hemoptysis, sputum production, shortness of breath and wheezing.   Cardiovascular: Negative for chest pain, palpitations, claudication, leg swelling and PND.  Gastrointestinal: Negative for heartburn, nausea, vomiting, abdominal pain, diarrhea, constipation, blood in stool and melena.  Genitourinary: Negative for dysuria, urgency, frequency and hematuria.  Musculoskeletal: Negative for myalgias, joint pain and falls.  Skin: Negative for itching and rash.  Neurological: Positive for tingling. Negative for dizziness, tremors, sensory change, speech change, focal weakness, seizures, loss of consciousness, weakness and headaches.       Diabetic neuropathy  Endo/Heme/Allergies: Does not bruise/bleed easily.  Psychiatric/Behavioral: Negative for depression, suicidal ideas, memory loss and substance abuse. The patient is not nervous/anxious and does not have insomnia.     PHYSICAL EXAMINATION:  ECOG PERFORMANCE STATUS: 0 - Asymptomatic  Filed Vitals:   01/31/14 1500  BP: 114/78  Pulse: 88  Temp: 98.4 F (36.9 C)  Resp: 16   Filed Weights   01/31/14 1500  Weight: 202 lb 11.2 oz (91.944 kg)     Physical Exam  Constitutional: He is oriented to person, place, and time and well-developed, well-nourished, and in no distress.  HENT:  Head: Normocephalic and atraumatic.  Nose: Nose normal.  Mouth/Throat: Oropharynx is clear and moist. No oropharyngeal exudate.  XRT changes to the neck, mild edema noted  Eyes: Conjunctivae and EOM are normal. Pupils are equal, round, and reactive to light.  Right eye exhibits no discharge. Left eye exhibits no discharge. No scleral icterus.  Neck: Normal range of motion. Neck supple. No tracheal deviation present. No thyromegaly present.  Cardiovascular: Normal rate, regular rhythm and normal heart sounds.  Exam reveals no gallop and no friction rub.   No murmur heard. Pulmonary/Chest: Effort normal and breath sounds normal. He has no wheezes. He has no rales.  Coarse rhonchi that clears with a cough  Abdominal: Soft. Bowel sounds are normal. He exhibits  no distension and no mass. There is no tenderness. There is no rebound and no guarding.  Musculoskeletal: Normal range of motion. He exhibits no edema.  Lymphadenopathy:    He has no cervical adenopathy.  Neurological: He is alert and oriented to person, place, and time. He has normal reflexes. No cranial nerve deficit. Gait normal. Coordination normal.  Skin: Skin is warm and dry. No rash noted.  Psychiatric: Mood, memory, affect and judgment normal.  Nursing note and vitals reviewed.    LABORATORY DATA:  I have reviewed the data as listed Lab Results  Component Value Date   WBC 6.4 02/07/2014   HGB 13.2 02/07/2014   HCT 40.5 02/07/2014   MCV 91.4 02/07/2014   PLT 143* 02/07/2014     Chemistry      Component Value Date/Time   NA 137 02/07/2014 1040   NA 142 12/16/2013 1305   K 4.0 02/07/2014 1040   K 4.7 12/16/2013 1305   CL 104 02/07/2014 1040   CO2 28 02/07/2014 1040   CO2 32* 12/16/2013 1305   BUN 17 02/07/2014 1040   BUN 16.6 12/16/2013 1305   CREATININE 0.68 02/07/2014 1040   CREATININE 0.8 12/16/2013 1305      Component Value Date/Time   CALCIUM 8.9 02/07/2014 1040   CALCIUM 9.9 12/16/2013 1305   ALKPHOS 80 02/07/2014 1040   ALKPHOS 84 12/16/2013 1305   AST 21 02/07/2014 1040   AST 20 12/16/2013 1305   ALT 12 02/07/2014 1040   ALT 13 12/16/2013 1305   BILITOT 0.7 02/07/2014 1040   BILITOT 0.45 12/16/2013 1305      ASSESSMENT & PLAN:  Tonsil  cancer Pleasant 64 year old male, nonsmoker, with HPV positive Stage IV squamous cell carcinoma of the head and neck. He and his wife had been advised based on PET imaging that his disease had returned. They were having difficulty accepting that, it is now biopsy-proven. We have long discussion today regarding treatment options. I again reviewed with them the chemotherapy option presented to them by Dr. Alvy Bimler. The patient does not want to wear the 5-FU pump. He feels it would be too restrictive. We spent a great deal time today discussing that.  I reviewed the NCCN guidelines with the patient and his wife in detail and offeedr them several other options. They have a lot of reservations in general about chemotherapy because he felt he did so poorly with his prior treatment. The plan will be for weekly cisplatin/taxol,preferably given on day 1, day 8, and day 15, with day 22 off. I advised he and his wife that we will simply take it week to week. I again addressed end-of-life issues with them. They have been doing some planning although it has been very difficult for them. I put them in touch with Hildred Alamin, our patient navigator and advised that she could be a good point of contact with them. We will plan on initiating therapy next week and see him back 1 week post to assess tolerance.    No orders of the defined types were placed in this encounter.    All questions were answered. The patient knows to call the clinic with any problems, questions or concerns.   Molli Hazard, MD MD 02/07/2014 8:28 PM

## 2014-02-01 ENCOUNTER — Other Ambulatory Visit (HOSPITAL_COMMUNITY): Payer: Self-pay | Admitting: Hematology & Oncology

## 2014-02-02 ENCOUNTER — Other Ambulatory Visit (HOSPITAL_COMMUNITY): Payer: Self-pay | Admitting: Hematology & Oncology

## 2014-02-02 MED ORDER — LIDOCAINE-PRILOCAINE 2.5-2.5 % EX CREA: TOPICAL_CREAM | CUTANEOUS | Status: DC

## 2014-02-05 NOTE — Patient Instructions (Addendum)
[facility]   CHEMOTHERAPY INSTRUCTIONS  Premeds: Aloxi - high powered nausea medication. This should last in your body for up to approximately 72 hours. Emend - high powered nausea medication. This should last in your body for up to approximately 72 hours. Dexamethasone - steroid- given to prevent/reduce the severity of an allergic reaction to the Taxol chemotherapy. This medication can cause you to have an increase in energy, make it harder for you to sleep, cause you to feel anxious, jittery, irritable, and can potentially cause you to look red or feel flushed in the face/neck areas. This will go away as the steroid wears off.   Cisplatin - this medication is hard on your kidneys. This is why we give you a large amount of fluids through your IV while you are here getting chemo. We also need you drinking 64 oz of fluid (preferably water/decaff fluids) 2 days prior to chemo and for up to 4-5 days after chemo. Drink more if you can. This will help keep your kidneys flushed. This can also cause acute and/or delayed nausea/vomiting. You must take your nausea meds as prescribed if you get nauseated. Do not wait until you start vomiting. This med can also cause peripheral neuropathy (numbness/tingling/burning in hands/fingers/feet/toes). Let us know if this develops so that we can monitor it and treat if necessary.  Taxol - the first time you receive this drug we will titrate it very slowly to ensure that you do not have or are not having an allergic reaction to the chemo. Side Effects: hair loss, lowers your white blood cells (fight infection), muscle aches, nausea/vomiting, irritation to the mouth (mouth sores, pain in your mouth) *neuropathy - numbness/tingling/burning in hands/fingers/feet/toes. We need to know as soon as this begins to happen so that we can monitor it and treat if necessary. The numbness generally begins in the fingertips of tips of toes and then begins to  travel up the finger/toe/hand/foot. We never want you getting to where you can't pick up a pen, coin, zip a zipper, button a button, or have trouble walking. You must tell us immediately if you are experiencing peripheral neuropathy!   POTENTIAL SIDE EFFECTS OF TREATMENT: Increased Susceptibility to Infection, Vomiting, Constipation, Hair Thinning, Changes in Character of Skin and Nails (brittleness, dryness,etc.), Bone Marrow Suppression, Abdominal Cramping, Complete Hair Loss, Nausea, Diarrhea and Mouth Sores   SELF IMAGE NEEDS AND REFERRALS MADE: Obtain hair accessories as soon as possible (caps,etc.)   EDUCATIONAL MATERIALS GIVEN AND REVIEWED: Chemotherapy and You booklet Specific Instructions Sheets: Cisplatin, Taxol, Aloxi, Emend, Dexamethasone   SELF CARE ACTIVITIES WHILE ON CHEMOTHERAPY: Increase your fluid intake 48 hours prior to treatment and drink at least 2 quarts per day after treatment., No alcohol intake., No aspirin or other medications unless approved by your oncologist., Eat foods that are light and easy to digest., Eat foods at cold or room temperature., No fried, fatty, or spicy foods immediately before or after treatment., Have teeth cleaned professionally before starting treatment. Keep dentures and partial plates clean., Use soft toothbrush and do not use mouthwashes that contain alcohol. Biotene is a good mouthwash that is available at most pharmacies or may be ordered by calling [phone]., Use warm salt water gargles (1 teaspoon salt per 1 quart warm water) before and after meals and at bedtime. Or you may rinse with 2 tablespoons of three -percent hydrogen peroxide mixed in eight ounces of water., Always use sunscreen with SPF Nancy Fetter Protection  Factor) of 30 or higher. and Use your nausea medication as directed to prevent nausea.  Please wash your hands for at least 30 seconds using warm soapy water. Handwashing is the #1 way to prevent the spread of germs. Stay away  from sick people or people who are getting over a cold. If you develop respiratory systems such as green/yellow mucus production or productive cough or persistent cough let us know and we will see if you need an antibiotic. It is a good idea to keep a pair of gloves on when going into grocery stores/[employer] to decrease your risk of coming into contact with germs on the carts, etc. Carry alcohol hand gel with you at all times and use it frequently if out in public. All foods need to be cooked thoroughly. No raw foods. No medium or undercooked meats, eggs. If your food is cooked medium well, it does not need to be hot pink or saturated with bloody liquid at all. Vegetables and fruits need to be washed/rinsed under the faucet with a dish detergent before being consumed. You can eat raw fruits and vegetables unless we tell you otherwise but it would be best if you cooked them or bought frozen. Do not eat off of salad bars or hot bars unless you really trust the cleanliness of the restaurant. If you need dental work, please let Dr. Whitney Muse know before you go for your appointment so that we can coordinate the best possible time for you in regards to your chemo regimen. You need to also let your dentist know that you are actively taking chemo. We may need to do labs prior to your dental appointment. We also want your bowels moving at least every other day. If this is not happening, we need to know so that we can get you on a bowel regimen to help you go.      MEDICATIONS: You have been given prescriptions for the following medications:  Over-the-Counter Meds:  Senna - this is a mild laxative used to treat mild constipation. May take 1-8 tabs by mouth everyday as needed for mild constipation.  Milk of Magnesia - this is a laxative used to treat moderate to severe constipation. May take 2-4 tablespoons every 8 hours as needed. May increase to 8 tablespoons x 1 dose and if no bowel movement call the [facility].  Imodium - this is for diarrhea. Take 2 tabs after 1st loose stool and then 1 tab every 2 hours until you go a total of 12 hours without a loose stool. Call [facility] if loose stools continue.   SYMPTOMS TO REPORT AS SOON AS POSSIBLE AFTER TREATMENT:  FEVER GREATER THAN 100.5 F  CHILLS WITH OR WITHOUT FEVER  NAUSEA AND VOMITING THAT IS NOT CONTROLLED WITH YOUR NAUSEA MEDICATION  UNUSUAL SHORTNESS OF BREATH  UNUSUAL BRUISING OR BLEEDING  TENDERNESS IN MOUTH AND THROAT WITH OR WITHOUT PRESENCE OF ULCERS  URINARY PROBLEMS  BOWEL PROBLEMS  UNUSUAL RASH    Wear comfortable clothing and clothing appropriate for easy access to any Portacath or PICC line. Let us know if there is anything that we can do to make your therapy better!      I have been informed and understand all of the instructions given to me and have received a copy. I have been instructed to call the clinic [phone] or my family physician as soon as possible for continued medical care, if indicated. I do not have any more questions  at this time but understand that I may call the [facility] or the Patient Navigator at [phone] during office hours should I have questions or need assistance in obtaining follow-up care.           Paclitaxel injection What is this medicine? PACLITAXEL (PAK li TAX el) is a chemotherapy drug. It targets fast dividing cells, like cancer cells, and causes these cells to die. This medicine is used to treat ovarian cancer, breast cancer, and other cancers. This medicine may be used for other purposes; ask your health care provider or pharmacist if you have questions. COMMON BRAND NAME(S): Onxol, Taxol What should I tell my health care provider before I take this medicine? They need to know if you have any of these conditions: -blood disorders -irregular heartbeat -infection (especially a virus infection such as chickenpox, cold sores, or herpes) -liver  disease -previous or ongoing radiation therapy -an unusual or allergic reaction to paclitaxel, alcohol, polyoxyethylated castor oil, other chemotherapy agents, other medicines, foods, dyes, or preservatives -pregnant or trying to get pregnant -breast-feeding How should I use this medicine? This drug is given as an infusion into a vein. It is administered in a hospital or clinic by a specially trained health care professional. Talk to your pediatrician regarding the use of this medicine in children. Special care may be needed. Overdosage: If you think you have taken too much of this medicine contact a poison control center or emergency room at once. NOTE: This medicine is only for you. Do not share this medicine with others. What if I miss a dose? It is important not to miss your dose. Call your doctor or health care professional if you are unable to keep an appointment. What may interact with this medicine? Do not take this medicine with any of the following medications: -disulfiram -metronidazole This medicine may also interact with the following medications: -cyclosporine -diazepam -ketoconazole -medicines to increase blood counts like filgrastim, pegfilgrastim, sargramostim -other chemotherapy drugs like cisplatin, doxorubicin, epirubicin, etoposide, teniposide, vincristine -quinidine -testosterone -vaccines -verapamil Talk to your doctor or health care professional before taking any of these medicines: -acetaminophen -aspirin -ibuprofen -ketoprofen -naproxen This list may not describe all possible interactions. Give your health care provider a list of all the medicines, herbs, non-prescription drugs, or dietary supplements you use. Also tell them if you smoke, drink alcohol, or use illegal drugs. Some items may interact with your medicine. What should I watch for while using this medicine? Your condition will be monitored carefully while you are receiving this medicine. You will  need important blood work done while you are taking this medicine. This drug may make you feel generally unwell. This is not uncommon, as chemotherapy can affect healthy cells as well as cancer cells. Report any side effects. Continue your course of treatment even though you feel ill unless your doctor tells you to stop. In some cases, you may be given additional medicines to help with side effects. Follow all directions for their use. Call your doctor or health care professional for advice if you get a fever, chills or sore throat, or other symptoms of a cold or flu. Do not treat yourself. This drug decreases your body's ability to fight infections. Try to avoid being around people who are sick. This medicine may increase your risk to bruise or bleed. Call your doctor or health care professional if you notice any unusual bleeding. Be careful brushing and flossing your teeth or using a toothpick because you may  get an infection or bleed more easily. If you have any dental work done, tell your dentist you are receiving this medicine. Avoid taking products that contain aspirin, acetaminophen, ibuprofen, naproxen, or ketoprofen unless instructed by your doctor. These medicines may hide a fever. Do not become pregnant while taking this medicine. Women should inform their doctor if they wish to become pregnant or think they might be pregnant. There is a potential for serious side effects to an unborn child. Talk to your health care professional or pharmacist for more information. Do not breast-feed an infant while taking this medicine. Men are advised not to father a child while receiving this medicine. What side effects may I notice from receiving this medicine? Side effects that you should report to your doctor or health care professional as soon as possible: -allergic reactions like skin rash, itching or hives, swelling of the face, lips, or tongue -low blood counts - This drug may decrease the number of  white blood cells, red blood cells and platelets. You may be at increased risk for infections and bleeding. -signs of infection - fever or chills, cough, sore throat, pain or difficulty passing urine -signs of decreased platelets or bleeding - bruising, pinpoint red spots on the skin, black, tarry stools, nosebleeds -signs of decreased red blood cells - unusually weak or tired, fainting spells, lightheadedness -breathing problems -chest pain -high or low blood pressure -mouth sores -nausea and vomiting -pain, swelling, redness or irritation at the injection site -pain, tingling, numbness in the hands or feet -slow or irregular heartbeat -swelling of the ankle, feet, hands Side effects that usually do not require medical attention (report to your doctor or health care professional if they continue or are bothersome): -bone pain -complete hair loss including hair on your head, underarms, pubic hair, eyebrows, and eyelashes -changes in the color of fingernails -diarrhea -loosening of the fingernails -loss of appetite -muscle or joint pain -red flush to skin -sweating This list may not describe all possible side effects. Call your doctor for medical advice about side effects. You may report side effects to FDA at 1-800-FDA-1088. Where should I keep my medicine? This drug is given in a hospital or clinic and will not be stored at home. NOTE: This sheet is a summary. It may not cover all possible information. If you have questions about this medicine, talk to your doctor, pharmacist, or health care provider.  2015, Elsevier/Gold Standard. (2012-02-23 16:41:21) Cisplatin injection What is this medicine? CISPLATIN (SIS pla tin) is a chemotherapy drug. It targets fast dividing cells, like cancer cells, and causes these cells to die. This medicine is used to treat many types of cancer like bladder, ovarian, and testicular cancers. This medicine may be used for other purposes; ask your health  care provider or pharmacist if you have questions. COMMON BRAND NAME(S): Platinol, Platinol -AQ What should I tell my health care provider before I take this medicine? They need to know if you have any of these conditions: -blood disorders -hearing problems -kidney disease -recent or ongoing radiation therapy -an unusual or allergic reaction to cisplatin, carboplatin, other chemotherapy, other medicines, foods, dyes, or preservatives -pregnant or trying to get pregnant -breast-feeding How should I use this medicine? This drug is given as an infusion into a vein. It is administered in a hospital or clinic by a specially trained health care professional. Talk to your pediatrician regarding the use of this medicine in children. Special care may be needed. Overdosage: If you think you  have taken too much of this medicine contact a poison control center or emergency room at once. NOTE: This medicine is only for you. Do not share this medicine with others. What if I miss a dose? It is important not to miss a dose. Call your doctor or health care professional if you are unable to keep an appointment. What may interact with this medicine? -dofetilide -foscarnet -medicines for seizures -medicines to increase blood counts like filgrastim, pegfilgrastim, sargramostim -probenecid -pyridoxine used with altretamine -rituximab -some antibiotics like amikacin, gentamicin, neomycin, polymyxin B, streptomycin, tobramycin -sulfinpyrazone -vaccines -zalcitabine Talk to your doctor or health care professional before taking any of these medicines: -acetaminophen -aspirin -ibuprofen -ketoprofen -naproxen This list may not describe all possible interactions. Give your health care provider a list of all the medicines, herbs, non-prescription drugs, or dietary supplements you use. Also tell them if you smoke, drink alcohol, or use illegal drugs. Some items may interact with your medicine. What should I  watch for while using this medicine? Your condition will be monitored carefully while you are receiving this medicine. You will need important blood work done while you are taking this medicine. This drug may make you feel generally unwell. This is not uncommon, as chemotherapy can affect healthy cells as well as cancer cells. Report any side effects. Continue your course of treatment even though you feel ill unless your doctor tells you to stop. In some cases, you may be given additional medicines to help with side effects. Follow all directions for their use. Call your doctor or health care professional for advice if you get a fever, chills or sore throat, or other symptoms of a cold or flu. Do not treat yourself. This drug decreases your body's ability to fight infections. Try to avoid being around people who are sick. This medicine may increase your risk to bruise or bleed. Call your doctor or health care professional if you notice any unusual bleeding. Be careful brushing and flossing your teeth or using a toothpick because you may get an infection or bleed more easily. If you have any dental work done, tell your dentist you are receiving this medicine. Avoid taking products that contain aspirin, acetaminophen, ibuprofen, naproxen, or ketoprofen unless instructed by your doctor. These medicines may hide a fever. Do not become pregnant while taking this medicine. Women should inform their doctor if they wish to become pregnant or think they might be pregnant. There is a potential for serious side effects to an unborn child. Talk to your health care professional or pharmacist for more information. Do not breast-feed an infant while taking this medicine. Drink fluids as directed while you are taking this medicine. This will help protect your kidneys. Call your doctor or health care professional if you get diarrhea. Do not treat yourself. What side effects may I notice from receiving this medicine? Side  effects that you should report to your doctor or health care professional as soon as possible: -allergic reactions like skin rash, itching or hives, swelling of the face, lips, or tongue -signs of infection - fever or chills, cough, sore throat, pain or difficulty passing urine -signs of decreased platelets or bleeding - bruising, pinpoint red spots on the skin, black, tarry stools, nosebleeds -signs of decreased red blood cells - unusually weak or tired, fainting spells, lightheadedness -breathing problems -changes in hearing -gout pain -low blood counts - This drug may decrease the number of white blood cells, red blood cells and platelets. You may be at  increased risk for infections and bleeding. -nausea and vomiting -pain, swelling, redness or irritation at the injection site -pain, tingling, numbness in the hands or feet -problems with balance, movement -trouble passing urine or change in the amount of urine Side effects that usually do not require medical attention (report to your doctor or health care professional if they continue or are bothersome): -changes in vision -loss of appetite -metallic taste in the mouth or changes in taste This list may not describe all possible side effects. Call your doctor for medical advice about side effects. You may report side effects to FDA at 1-800-FDA-1088. Where should I keep my medicine? This drug is given in a hospital or clinic and will not be stored at home. NOTE: This sheet is a summary. It may not cover all possible information. If you have questions about this medicine, talk to your doctor, pharmacist, or health care provider.  2015, Elsevier/Gold Standard. (2007-04-06 14:40:54) Palonosetron Injection What is this medicine? PALONOSETRON (pal oh NOE se tron) is used to prevent nausea and vomiting caused by chemotherapy. It also helps prevent delayed nausea and vomiting that may occur a few days after your treatment. This medicine may be  used for other purposes; ask your health care provider or pharmacist if you have questions. COMMON BRAND NAME(S): Aloxi What should I tell my health care provider before I take this medicine? They need to know if you have any of these conditions: -an unusual or allergic reaction to palonosetron, dolasetron, granisetron, ondansetron, other medicines, foods, dyes, or preservatives -pregnant or trying to get pregnant -breast-feeding How should I use this medicine? This medicine is for infusion into a vein. It is given by a health care professional in a hospital or clinic setting. Talk to your pediatrician regarding the use of this medicine in children. While this drug may be prescribed for children as young as 1 month for selected conditions, precautions do apply. Overdosage: If you think you have taken too much of this medicine contact a poison control center or emergency room at once. NOTE: This medicine is only for you. Do not share this medicine with others. What if I miss a dose? This does not apply. What may interact with this medicine? -certain medicines for depression, anxiety, or psychotic disturbances -fentanyl -linezolid -MAOIs like Carbex, Eldepryl, Marplan, Nardil, and Parnate -methylene blue (injected into a vein) -tramadol This list may not describe all possible interactions. Give your health care provider a list of all the medicines, herbs, non-prescription drugs, or dietary supplements you use. Also tell them if you smoke, drink alcohol, or use illegal drugs. Some items may interact with your medicine. What should I watch for while using this medicine? Your condition will be monitored carefully while you are receiving this medicine. What side effects may I notice from receiving this medicine? Side effects that you should report to your doctor or health care professional as soon as possible: -allergic reactions like skin rash, itching or hives, swelling of the face, lips, or  tongue -breathing problems -confusion -dizziness -fast, irregular heartbeat -fever and chills -loss of balance or coordination -seizures -sweating -swelling of the hands and feet -tremors -unusually weak or tired Side effects that usually do not require medical attention (report to your doctor or health care professional if they continue or are bothersome): -constipation or diarrhea -headache This list may not describe all possible side effects. Call your doctor for medical advice about side effects. You may report side effects to FDA at 1-800-FDA-1088.  Where should I keep my medicine? This drug is given in a hospital or clinic and will not be stored at home. NOTE: This sheet is a summary. It may not cover all possible information. If you have questions about this medicine, talk to your doctor, pharmacist, or health care provider.  2015, [employer]/Gold Standard. (2012-11-05 10:38:36) Fosaprepitant injection What is this medicine? FOSAPREPITANT (fos ap RE pi tant) is used together with other medicines to prevent nausea and vomiting caused by cancer treatment (chemotherapy). This medicine may be used for other purposes; ask your health care provider or pharmacist if you have questions. COMMON BRAND NAME(S): Emend What should I tell my health care provider before I take this medicine? They need to know if you have any of these conditions: -liver disease -an unusual or allergic reaction to fosaprepitant, aprepitant, medicines, foods, dyes, or preservatives -pregnant or trying to get pregnant -breast-feeding How should I use this medicine? This medicine is for injection into a vein. It is given by a health care professional in a hospital or clinic setting. Talk to your pediatrician regarding the use of this medicine in children. Special care may be needed. Overdosage: If you think you have taken too much of this medicine contact a poison control center or emergency room at once. NOTE:  This medicine is only for you. Do not share this medicine with others. What if I miss a dose? This does not apply. What may interact with this medicine? Do not take this medicine with any of these medicines: -cisapride -pimozide -ranolazine This medicine may also interact with the following medications: -diltiazem -male hormones, like estrogens or progestins and birth control pills -medicines for fungal infections like ketoconazole and itraconazole -medicines for HIV -medicines for seizures or to control epilepsy like carbamazepine or phenytoin -medicines used for sleep or anxiety disorders like alprazolam, diazepam, or midazolam -nefazodone -paroxetine -rifampin -some chemotherapy medications like etoposide, ifosfamide, vinblastine, vincristine -some antibiotics like clarithromycin, erythromycin, troleandomycin -steroid medicines like dexamethasone or methylprednisolone -tolbutamide -warfarin This list may not describe all possible interactions. Give your health care provider a list of all the medicines, herbs, non-prescription drugs, or dietary supplements you use. Also tell them if you smoke, drink alcohol, or use illegal drugs. Some items may interact with your medicine. What should I watch for while using this medicine? Do not take this medicine if you already have nausea and vomiting. Ask your health care provider what to do if you already have nausea. Birth control pills may not work properly while you are taking this medicine. Talk to your doctor about using an extra method of birth control. This medicine should not be used continuously for a long time. Visit your doctor or health care professional for regular check-ups. This medicine may change your liver function blood test results. What side effects may I notice from receiving this medicine? Side effects that you should report to your doctor or health care professional as soon as possible: -allergic reactions like skin  rash, itching or hives, swelling of the face, lips, or tongue -breathing problems -changes in heart rhythm -high or low blood pressure -pain, redness, or irritation at site where injected -rectal bleeding -serious dizziness or disorientation, confusion -sharp or severe stomach pain -sharp pain in your leg Side effects that usually do not require medical attention (report to your doctor or health care professional if they continue or are bothersome): -constipation or diarrhea -hair loss -headache -hiccups -loss of appetite -nausea -upset stomach -tiredness This list may not  describe all possible side effects. Call your doctor for medical advice about side effects. You may report side effects to FDA at 1-800-FDA-1088. Where should I keep my medicine? This drug is given in a hospital or clinic and will not be stored at home. NOTE: This sheet is a summary. It may not cover all possible information. If you have questions about this medicine, talk to your doctor, pharmacist, or health care provider.  2015, Elsevier/Gold Standard. (2008-12-12 12:46:13) Dexamethasone injection What is this medicine? DEXAMETHASONE (dex a METH a sone) is a corticosteroid. It is used to treat inflammation of the skin, joints, lungs, and other organs. Common conditions treated include asthma, allergies, and arthritis. It is also used for other conditions, like blood disorders and diseases of the adrenal glands. This medicine may be used for other purposes; ask your health care provider or pharmacist if you have questions. COMMON BRAND NAME(S): Decadron, Solurex What should I tell my health care provider before I take this medicine? They need to know if you have any of these conditions: -blood clotting problems -Cushing's syndrome -diabetes -glaucoma -heart problems or disease -high blood pressure -infection like herpes, measles, tuberculosis, or chickenpox -kidney disease -liver disease -mental  problems -myasthenia gravis -osteoporosis -previous heart attack -seizures -stomach, ulcer or intestine disease including colitis and diverticulitis -thyroid problem -an unusual or allergic reaction to dexamethasone, corticosteroids, other medicines, lactose, foods, dyes, or preservatives -pregnant or trying to get pregnant -breast-feeding How should I use this medicine? This medicine is for injection into a muscle, joint, lesion, soft tissue, or vein. It is given by a health care professional in a hospital or clinic setting. Talk to your pediatrician regarding the use of this medicine in children. Special care may be needed. Overdosage: If you think you have taken too much of this medicine contact a poison control center or emergency room at once. NOTE: This medicine is only for you. Do not share this medicine with others. What if I miss a dose? This may not apply. If you are having a series of injections over a prolonged period, try not to miss an appointment. Call your doctor or health care professional to reschedule if you are unable to keep an appointment. What may interact with this medicine? Do not take this medicine with any of the following medications: -mifepristone, RU-486 -vaccines This medicine may also interact with the following medications: -amphotericin B -antibiotics like clarithromycin, erythromycin, and troleandomycin -aspirin and aspirin-like drugs -barbiturates like phenobarbital -carbamazepine -cholestyramine -cholinesterase inhibitors like donepezil, galantamine, rivastigmine, and tacrine -cyclosporine -digoxin -diuretics -ephedrine -male hormones, like estrogens or progestins and birth control pills -indinavir -isoniazid -ketoconazole -medicines for diabetes -medicines that improve muscle tone or strength for conditions like myasthenia gravis -NSAIDs, medicines for pain and inflammation, like ibuprofen or  naproxen -phenytoin -rifampin -thalidomide -warfarin This list may not describe all possible interactions. Give your health care provider a list of all the medicines, herbs, non-prescription drugs, or dietary supplements you use. Also tell them if you smoke, drink alcohol, or use illegal drugs. Some items may interact with your medicine. What should I watch for while using this medicine? Your condition will be monitored carefully while you are receiving this medicine. If you are taking this medicine for a long time, carry an identification card with your name and address, the type and dose of your medicine, and your doctor's name and address. This medicine may increase your risk of getting an infection. Stay away from people who are sick. Tell your  doctor or health care professional if you are around anyone with measles or chickenpox. Talk to your health care provider before you get any vaccines that you take this medicine. If you are going to have surgery, tell your doctor or health care professional that you have taken this medicine within the last twelve months. Ask your doctor or health care professional about your diet. You may need to lower the amount of salt you eat. The medicine can increase your blood sugar. If you are a diabetic check with your doctor if you need help adjusting the dose of your diabetic medicine. What side effects may I notice from receiving this medicine? Side effects that you should report to your doctor or health care professional as soon as possible: -allergic reactions like skin rash, itching or hives, swelling of the face, lips, or tongue -black or tarry stools -change in the amount of urine -changes in vision -confusion, excitement, restlessness, a false sense of well-being -fever, sore throat, sneezing, cough, or other signs of infection, wounds that will not heal -hallucinations -increased thirst -mental depression, mood swings, mistaken feelings of self  importance or of being mistreated -pain in hips, back, ribs, arms, shoulders, or legs -pain, redness, or irritation at the injection site -redness, blistering, peeling or loosening of the skin, including inside the mouth -rounding out of face -swelling of feet or lower legs -unusual bleeding or bruising -unusual tired or weak -wounds that do not heal Side effects that usually do not require medical attention (report to your doctor or health care professional if they continue or are bothersome): -diarrhea or constipation -change in taste -headache -nausea, vomiting -skin problems, acne, thin and shiny skin -touble sleeping -unusual growth of hair on the face or body -weight gain This list may not describe all possible side effects. Call your doctor for medical advice about side effects. You may report side effects to FDA at 1-800-FDA-1088. Where should I keep my medicine? This drug is given in a hospital or clinic and will not be stored at home. NOTE: This sheet is a summary. It may not cover all possible information. If you have questions about this medicine, talk to your doctor, pharmacist, or health care provider.  2015, Elsevier/Gold Standard. (2007-04-22 14:04:12)

## 2014-02-06 ENCOUNTER — Encounter (HOSPITAL_COMMUNITY): Payer: 59

## 2014-02-06 DIAGNOSIS — C7802 Secondary malignant neoplasm of left lung: Secondary | ICD-10-CM

## 2014-02-06 DIAGNOSIS — C099 Malignant neoplasm of tonsil, unspecified: Secondary | ICD-10-CM

## 2014-02-06 DIAGNOSIS — C7801 Secondary malignant neoplasm of right lung: Secondary | ICD-10-CM

## 2014-02-06 NOTE — Progress Notes (Signed)
Chemo teaching done for Taxol & Cisplatin via phone with patient and his wife. No charge for this visit since it was over the phone. Distress screening & consent to be obtained on Tuesday 02/07/13.

## 2014-02-07 ENCOUNTER — Encounter (HOSPITAL_BASED_OUTPATIENT_CLINIC_OR_DEPARTMENT_OTHER): Payer: 59

## 2014-02-07 DIAGNOSIS — C7801 Secondary malignant neoplasm of right lung: Secondary | ICD-10-CM

## 2014-02-07 DIAGNOSIS — Z9221 Personal history of antineoplastic chemotherapy: Secondary | ICD-10-CM | POA: Diagnosis not present

## 2014-02-07 DIAGNOSIS — Z5111 Encounter for antineoplastic chemotherapy: Secondary | ICD-10-CM

## 2014-02-07 DIAGNOSIS — Z923 Personal history of irradiation: Secondary | ICD-10-CM | POA: Diagnosis not present

## 2014-02-07 DIAGNOSIS — C7800 Secondary malignant neoplasm of unspecified lung: Secondary | ICD-10-CM

## 2014-02-07 DIAGNOSIS — C099 Malignant neoplasm of tonsil, unspecified: Secondary | ICD-10-CM | POA: Diagnosis present

## 2014-02-07 DIAGNOSIS — C7802 Secondary malignant neoplasm of left lung: Secondary | ICD-10-CM

## 2014-02-07 LAB — COMPREHENSIVE METABOLIC PANEL
ALBUMIN: 3.8 g/dL (ref 3.5–5.2)
ALT: 12 U/L (ref 0–53)
ANION GAP: 5 (ref 5–15)
AST: 21 U/L (ref 0–37)
Alkaline Phosphatase: 80 U/L (ref 39–117)
BUN: 17 mg/dL (ref 6–23)
CALCIUM: 8.9 mg/dL (ref 8.4–10.5)
CO2: 28 mmol/L (ref 19–32)
CREATININE: 0.68 mg/dL (ref 0.50–1.35)
Chloride: 104 mmol/L (ref 96–112)
GFR calc non Af Amer: >90 mL/min (ref >90)
Glucose, Bld: 99 mg/dL (ref 70–99)
POTASSIUM: 4 mmol/L (ref 3.5–5.1)
SODIUM: 137 mmol/L (ref 135–145)
Total Bilirubin: 0.7 mg/dL (ref 0.3–1.2)
Total Protein: 7.5 g/dL (ref 6.0–8.3)

## 2014-02-07 LAB — CBC WITH DIFFERENTIAL/PLATELET
BASOS PCT: 1 % (ref 0–1)
Basophils Absolute: 0 10*3/uL (ref 0.0–0.1)
EOS PCT: 5 % (ref 0–5)
Eosinophils Absolute: 0.3 10*3/uL (ref 0.0–0.7)
HCT: 40.5 % (ref 39.0–52.0)
Hemoglobin: 13.2 g/dL (ref 13.0–17.0)
Lymphocytes Relative: 6 % — ABNORMAL LOW (ref 12–46)
Lymphs Abs: 0.4 10*3/uL — ABNORMAL LOW (ref 0.7–4.0)
MCH: 29.8 pg (ref 26.0–34.0)
MCHC: 32.6 g/dL (ref 30.0–36.0)
MCV: 91.4 fL (ref 78.0–100.0)
Monocytes Absolute: 0.7 10*3/uL (ref 0.1–1.0)
Monocytes Relative: 11 % (ref 3–12)
NEUTROS ABS: 4.9 10*3/uL (ref 1.7–7.7)
Neutrophils Relative %: 77 % (ref 43–77)
Platelets: 143 10*3/uL — ABNORMAL LOW (ref 150–400)
RBC: 4.43 MIL/uL (ref 4.22–5.81)
RDW: 14.9 % (ref 11.5–15.5)
WBC: 6.4 10*3/uL (ref 4.0–10.5)

## 2014-02-07 MED ORDER — DEXAMETHASONE SODIUM PHOSPHATE 10 MG/ML IJ SOLN: 20.0000 mg | Freq: Once | INTRAMUSCULAR | Status: DC

## 2014-02-07 MED ORDER — SODIUM CHLORIDE 0.9 % IV SOLN
Freq: Once | INTRAVENOUS | Status: AC
  Administered 2014-02-07: 11:00:00 via INTRAVENOUS

## 2014-02-07 MED ORDER — PALONOSETRON HCL INJECTION 0.25 MG/5ML
0.2500 mg | Freq: Once | INTRAVENOUS | Status: AC
  Administered 2014-02-07: 0.25 mg via INTRAVENOUS
  Filled 2014-02-07: qty 5

## 2014-02-07 MED ORDER — POTASSIUM CHLORIDE 2 MEQ/ML IV SOLN
Freq: Once | INTRAVENOUS | Status: AC
  Administered 2014-02-07: 11:00:00 via INTRAVENOUS
  Filled 2014-02-07: qty 10

## 2014-02-07 MED ORDER — SODIUM CHLORIDE 0.9 % IJ SOLN
10.0000 mL | INTRAMUSCULAR | Status: DC | PRN
  Administered 2014-02-07: 10 mL
  Filled 2014-02-07: qty 10

## 2014-02-07 MED ORDER — SODIUM CHLORIDE 0.9 % IV SOLN
Freq: Once | INTRAVENOUS | Status: AC
  Administered 2014-02-07: 13:00:00 via INTRAVENOUS
  Filled 2014-02-07: qty 5

## 2014-02-07 MED ORDER — SODIUM CHLORIDE 0.9 % IV SOLN: 150.0000 mg | Freq: Once | INTRAVENOUS | Status: DC

## 2014-02-07 MED ORDER — HEPARIN SOD (PORK) LOCK FLUSH 100 UNIT/ML IV SOLN
500.0000 [IU] | Freq: Once | INTRAVENOUS | Status: AC | PRN
  Administered 2014-02-07: 500 [IU]
  Filled 2014-02-07: qty 5

## 2014-02-07 MED ORDER — SODIUM CHLORIDE 0.9 % IV SOLN
40.0000 mg/m2 | Freq: Once | INTRAVENOUS | Status: AC
  Administered 2014-02-07: 88 mg via INTRAVENOUS
  Filled 2014-02-07: qty 88

## 2014-02-07 MED ORDER — PACLITAXEL CHEMO INJECTION 300 MG/50ML
80.0000 mg/m2 | Freq: Once | INTRAVENOUS | Status: AC
  Administered 2014-02-07: 174 mg via INTRAVENOUS
  Filled 2014-02-07: qty 29

## 2014-02-07 NOTE — Progress Notes (Signed)
Tolerated chemo well. 

## 2014-02-07 NOTE — Addendum Note (Signed)
Addended by: Gerhard Perches on: 02/07/2014 09:18 AM   Modules accepted: Orders

## 2014-02-07 NOTE — Assessment & Plan Note (Signed)
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 He and his wife had been advised based on PET imaging that his disease had returned. They were having difficulty accepting that, it is now biopsy-proven. We have long discussion today regarding treatment options. I again reviewed with them the chemotherapy option presented to them by Dr. Alvy Bimler. The patient does not want to wear the 5-FU pump. He feels it would be too restrictive. We spent a great deal time today discussing that.  I reviewed the NCCN guidelines with the patient and his wife in detail and offeedr them several other options. They have a lot of reservations in general about chemotherapy because he felt he did so poorly with his prior treatment. The plan will be for weekly cisplatin/taxol,preferably given on day 1, day 8, and day 15, with day 22 off. I advised he and his wife that we will simply take it week to week. I again addressed end-of-life issues with them. They have been doing some planning although it has been very difficult for them. I put them in touch with Hildred Alamin, our patient navigator and advised that she could be a good point of contact with them. We will plan on initiating therapy next week and see him back 1 week post to assess tolerance.

## 2014-02-07 NOTE — Patient Instructions (Signed)
[facility] Discharge Instructions for Patients Receiving Chemotherapy  Today you received the following chemotherapy agents Taxol and Cisplatin  To help prevent nausea and vomiting after your treatment, we encourage you to take your nausea medication as instructed.   If you develop nausea and vomiting that is not controlled by your nausea medication, call the clinic. If it is after clinic hours your family physician or the after hours number for the clinic or go to the Emergency Department.   BELOW ARE SYMPTOMS THAT SHOULD BE REPORTED IMMEDIATELY:  *FEVER GREATER THAN 101.0 F  *CHILLS WITH OR WITHOUT FEVER  NAUSEA AND VOMITING THAT IS NOT CONTROLLED WITH YOUR NAUSEA MEDICATION  *UNUSUAL SHORTNESS OF BREATH  *UNUSUAL BRUISING OR BLEEDING  TENDERNESS IN MOUTH AND THROAT WITH OR WITHOUT PRESENCE OF ULCERS  *URINARY PROBLEMS  *BOWEL PROBLEMS  UNUSUAL RASH Items with * indicate a potential emergency and should be followed up as soon as possible.  Return as scheduled.  I have been informed and understand all the instructions given to me. I know to contact the clinic, my physician, or go to the Emergency Department if any problems should occur. I do not have any questions at this time, but understand that I may call the clinic during office hours or the Patient Navigator at [phone] should I have any questions or need assistance in obtaining follow up care.    __________________________________________  _____________  __________ Signature of Patient or Authorized Representative            Date                   Time    __________________________________________ Nurse's Signature

## 2014-02-07 NOTE — Progress Notes (Signed)
Chemo teaching done yday over phone. What to Know after Chemo reviewed today. Med calendar given to patient. Chemo consent obtained for Cisplatin & Taxol.

## 2014-02-07 NOTE — Assessment & Plan Note (Signed)
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 He was treated with concurrent radiation and Erbitux, chemotherapy was interrupted multiple times secondary to side effects. Imaging studies since completion of his therapy are highly suggestive of recurrent disease. He and his wife are having difficulty accepting that his cancer has come back.  They have had extensive conversations with Dr. Alvy Bimler. We discussed obtaining a biopsy of his pulmonary nodules. They feel like if the biopsy was "positive" they could be more accepting. They do not understand how his cancer could be back because he is "doing so well."  I advised him I will address this possibility with interventional radiology. If they are agreeable that he is a good candidate for biopsy, that is how we will proceed.  He is very fearful of additional treatment because of his prior experience. I advised Tysin that a lot of the difficulties with his prior therapy was from the combination of chemotherapy and radiation. I advised him there are treatments he could take that woul potentially prolong his life and keep him feeling well for a longer period. We reviewed the NCCN guidelines. I will bring him back post-biopsy to review his final pathology and discuss treatment options should he elect to pursue them.

## 2014-02-08 ENCOUNTER — Telehealth (HOSPITAL_COMMUNITY): Payer: Self-pay | Admitting: *Deleted

## 2014-02-08 ENCOUNTER — Other Ambulatory Visit (HOSPITAL_COMMUNITY): Payer: Self-pay | Admitting: *Deleted

## 2014-02-08 ENCOUNTER — Encounter: Payer: Self-pay | Admitting: *Deleted

## 2014-02-08 DIAGNOSIS — C099 Malignant neoplasm of tonsil, unspecified: Secondary | ICD-10-CM

## 2014-02-08 MED ORDER — CITALOPRAM HYDROBROMIDE 20 MG PO TABS: 20.0000 mg | ORAL_TABLET | Freq: Every day | ORAL | Status: DC

## 2014-02-08 NOTE — Progress Notes (Signed)
[place] [employer] Late entry from 02/07/14  Clinical Social Work was referred by CSW rounding for assessment of psychosocial needs due to working with pt and wife in the past at [facility].  Clinical Social Worker met with patient and his wife in treatment room to offer support and assess for needs.  Pt and wife remembered CSW from past interactions. They have needed much emotional support due to ongoing struggles due to Pt's illness. Wife has attended [employer] and other support programs through [employer].   Wife concerned today about pt's plan of care and was anxious and tearful. Wife shared she was "up all night worrying" about how pt's chemo was going to go. CSW team had referred wife for additional counseling support in the past. Wife shared she had not followed up to date, but has been receiving additional support through her church. CSW allowed her to vent her concerns and provided supportive listening. CSW also discussed additional resources for support, such as Duanne Limerick, continuing to attend Pt and Family Support Programs at [facility] and to reach out to [place] as needed. Pt and wife aware of how to reach out to CSW as needed.   CSW followed up on 02/08/14 via phone to check in after appointment on 02/07/14. Wife shared she thought the rest of the afternoon went better after additional information from the team. CSW sent wife additional resources via email and will continue to follow for support.   Clinical Social Work interventions: Programme researcher, broadcasting/film/video education Pt advocacy  Loren Racer, [place] Tuesdays 8:30-1pm Wednesdays 8:30-12pm  [phone]

## 2014-02-08 NOTE — Telephone Encounter (Signed)
24h follow up: Patient is doing good today. No problems voiced today. Wife states he is doing good.

## 2014-02-09 ENCOUNTER — Telehealth (HOSPITAL_COMMUNITY): Payer: Self-pay | Admitting: *Deleted

## 2014-02-09 NOTE — Telephone Encounter (Signed)
Spoke to patients wife [name]. She reports patient is doing really well since chemo. Reports he is eating and drinking well. Has no complaints at all right now.

## 2014-02-15 ENCOUNTER — Encounter (HOSPITAL_COMMUNITY): Payer: Self-pay

## 2014-02-15 ENCOUNTER — Encounter (HOSPITAL_BASED_OUTPATIENT_CLINIC_OR_DEPARTMENT_OTHER): Payer: 59 | Admitting: Hematology & Oncology

## 2014-02-15 ENCOUNTER — Encounter (HOSPITAL_COMMUNITY): Payer: 59 | Attending: Hematology & Oncology

## 2014-02-15 ENCOUNTER — Encounter (HOSPITAL_COMMUNITY): Payer: Self-pay | Admitting: Hematology & Oncology

## 2014-02-15 DIAGNOSIS — C7800 Secondary malignant neoplasm of unspecified lung: Secondary | ICD-10-CM

## 2014-02-15 DIAGNOSIS — C7802 Secondary malignant neoplasm of left lung: Secondary | ICD-10-CM | POA: Insufficient documentation

## 2014-02-15 DIAGNOSIS — Z923 Personal history of irradiation: Secondary | ICD-10-CM | POA: Insufficient documentation

## 2014-02-15 DIAGNOSIS — Z9221 Personal history of antineoplastic chemotherapy: Secondary | ICD-10-CM | POA: Insufficient documentation

## 2014-02-15 DIAGNOSIS — C099 Malignant neoplasm of tonsil, unspecified: Secondary | ICD-10-CM | POA: Diagnosis not present

## 2014-02-15 DIAGNOSIS — C7801 Secondary malignant neoplasm of right lung: Secondary | ICD-10-CM | POA: Diagnosis not present

## 2014-02-15 DIAGNOSIS — Z5111 Encounter for antineoplastic chemotherapy: Secondary | ICD-10-CM

## 2014-02-15 LAB — COMPREHENSIVE METABOLIC PANEL
ALBUMIN: 3.7 g/dL (ref 3.5–5.2)
ALK PHOS: 77 U/L (ref 39–117)
ALT: 11 U/L (ref 0–53)
AST: 19 U/L (ref 0–37)
Anion gap: 7 (ref 5–15)
BUN: 14 mg/dL (ref 6–23)
CO2: 30 mmol/L (ref 19–32)
Calcium: 9.2 mg/dL (ref 8.4–10.5)
Chloride: 101 mmol/L (ref 96–112)
Creatinine, Ser: 0.66 mg/dL (ref 0.50–1.35)
GFR calc Af Amer: >90 mL/min (ref >90)
GLUCOSE: 94 mg/dL (ref 70–99)
Potassium: 4.2 mmol/L (ref 3.5–5.1)
Sodium: 138 mmol/L (ref 135–145)
Total Bilirubin: 0.6 mg/dL (ref 0.3–1.2)
Total Protein: 7.5 g/dL (ref 6.0–8.3)

## 2014-02-15 LAB — CBC WITH DIFFERENTIAL/PLATELET
BASOS ABS: 0.1 10*3/uL (ref 0.0–0.1)
BASOS PCT: 1 % (ref 0–1)
EOS ABS: 0.1 10*3/uL (ref 0.0–0.7)
Eosinophils Relative: 2 % (ref 0–5)
HCT: 37.7 % — ABNORMAL LOW (ref 39.0–52.0)
HEMOGLOBIN: 12.4 g/dL — AB (ref 13.0–17.0)
LYMPHS PCT: 10 % — AB (ref 12–46)
Lymphs Abs: 0.5 10*3/uL — ABNORMAL LOW (ref 0.7–4.0)
MCH: 30.1 pg (ref 26.0–34.0)
MCHC: 32.9 g/dL (ref 30.0–36.0)
MCV: 91.5 fL (ref 78.0–100.0)
MONO ABS: 0.3 10*3/uL (ref 0.1–1.0)
MONOS PCT: 6 % (ref 3–12)
Neutro Abs: 4.2 10*3/uL (ref 1.7–7.7)
Neutrophils Relative %: 81 % — ABNORMAL HIGH (ref 43–77)
PLATELETS: 145 10*3/uL — AB (ref 150–400)
RBC: 4.12 MIL/uL — ABNORMAL LOW (ref 4.22–5.81)
RDW: 14.6 % (ref 11.5–15.5)
WBC: 5.2 10*3/uL (ref 4.0–10.5)

## 2014-02-15 LAB — TSH: TSH: 0.478 u[IU]/mL (ref 0.350–4.500)

## 2014-02-15 MED ORDER — POTASSIUM CHLORIDE 2 MEQ/ML IV SOLN
Freq: Once | INTRAVENOUS | Status: AC
  Administered 2014-02-15: 09:00:00 via INTRAVENOUS
  Filled 2014-02-15: qty 10

## 2014-02-15 MED ORDER — SODIUM CHLORIDE 0.9 % IV SOLN
40.0000 mg/m2 | Freq: Once | INTRAVENOUS | Status: AC
  Administered 2014-02-15: 88 mg via INTRAVENOUS
  Filled 2014-02-15: qty 88

## 2014-02-15 MED ORDER — DEXTROSE 5 % IV SOLN
80.0000 mg/m2 | Freq: Once | INTRAVENOUS | Status: AC
  Administered 2014-02-15: 174 mg via INTRAVENOUS
  Filled 2014-02-15: qty 29

## 2014-02-15 MED ORDER — DEXAMETHASONE SODIUM PHOSPHATE 10 MG/ML IJ SOLN
10.0000 mg | Freq: Once | INTRAMUSCULAR | Status: DC
Start: 2014-02-15 — End: 2014-02-15

## 2014-02-15 MED ORDER — SODIUM CHLORIDE 0.9 % IJ SOLN
10.0000 mL | INTRAMUSCULAR | Status: DC | PRN
  Administered 2014-02-15: 10 mL
  Filled 2014-02-15: qty 10

## 2014-02-15 MED ORDER — HEPARIN SOD (PORK) LOCK FLUSH 100 UNIT/ML IV SOLN
INTRAVENOUS | Status: AC
  Filled 2014-02-15: qty 5

## 2014-02-15 MED ORDER — LORATADINE 10 MG PO TABS
10.0000 mg | ORAL_TABLET | Freq: Every day | ORAL | Status: DC
  Administered 2014-02-15: 10 mg via ORAL
  Filled 2014-02-15: qty 1

## 2014-02-15 MED ORDER — PALONOSETRON HCL INJECTION 0.25 MG/5ML
0.2500 mg | Freq: Once | INTRAVENOUS | Status: AC
  Administered 2014-02-15: 0.25 mg via INTRAVENOUS
  Filled 2014-02-15: qty 5

## 2014-02-15 MED ORDER — SODIUM CHLORIDE 0.9 % IV SOLN
Freq: Once | INTRAVENOUS | Status: AC
  Administered 2014-02-15: 09:00:00 via INTRAVENOUS

## 2014-02-15 MED ORDER — SODIUM CHLORIDE 0.9 % IV SOLN
Freq: Once | INTRAVENOUS | Status: AC
  Administered 2014-02-15: 11:00:00 via INTRAVENOUS
  Filled 2014-02-15: qty 5

## 2014-02-15 MED ORDER — HEPARIN SOD (PORK) LOCK FLUSH 100 UNIT/ML IV SOLN
500.0000 [IU] | Freq: Once | INTRAVENOUS | Status: AC | PRN
  Administered 2014-02-15: 500 [IU]
  Filled 2014-02-15: qty 5

## 2014-02-15 NOTE — Assessment & Plan Note (Signed)
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 He has been treated with cycle 1 day 1 of cisplatin and Taxol given on a weekly basis and done very well. [name] has absolutely no complaints today he states he has not been limited in his activity he denies nausea vomiting or neuropathic symptoms. He says he is sleeping well and he also notes his wife is sleeping better. He is here today for cycle 1 day 8, we will plan on treating him next week at day 15 pending his counts. Day 22 will be off. We discussed when we would reimage, most likely after 6 cycles of chemotherapy. He was advised to call in the interim with any problems or concerns.

## 2014-02-15 NOTE — Patient Instructions (Signed)
[facility] Discharge Instructions for Patients Receiving Chemotherapy  Today you received the following chemotherapy agents week 2 Taxol and Cisplatin.  To help prevent nausea and vomiting after your treatment, we encourage you to take your nausea medication as instructed.   If you develop nausea and vomiting that is not controlled by your nausea medication, call the clinic. If it is after clinic hours your family physician or the after hours number for the clinic or go to the Emergency Department.  BELOW ARE SYMPTOMS THAT SHOULD BE REPORTED IMMEDIATELY:  *FEVER GREATER THAN 101.0 F  *CHILLS WITH OR WITHOUT FEVER  NAUSEA AND VOMITING THAT IS NOT CONTROLLED WITH YOUR NAUSEA MEDICATION  *UNUSUAL SHORTNESS OF BREATH  *UNUSUAL BRUISING OR BLEEDING  TENDERNESS IN MOUTH AND THROAT WITH OR WITHOUT PRESENCE OF ULCERS  *URINARY PROBLEMS  *BOWEL PROBLEMS  UNUSUAL RASH Items with * indicate a potential emergency and should be followed up as soon as possible.  Return as scheduled.  I have been informed and understand all the instructions given to me. I know to contact the clinic, my physician, or go to the Emergency Department if any problems should occur. I do not have any questions at this time, but understand that I may call the clinic during office hours or the Patient Navigator at [phone] should I have any questions or need assistance in obtaining follow up care.    __________________________________________  _____________  __________ Signature of Patient or Authorized Representative            Date                   Time    __________________________________________ Nurse's Signature

## 2014-02-15 NOTE — Progress Notes (Signed)
Tolerated chemo well. 

## 2014-02-15 NOTE — Patient Instructions (Signed)
[place] at [facility] Discharge Instructions  RECOMMENDATIONS MADE BY THE CONSULTANT AND ANY TEST RESULTS WILL BE SENT TO YOUR REFERRING PHYSICIAN.  Return as scheduled (one week) for Cycle 1 Day 15 of chemotherapy.  Thank you for choosing [place] at [facility] to provide your oncology and hematology care.  To afford each patient quality time with our provider, please arrive at least 15 minutes before your scheduled appointment time.    You need to re-schedule your appointment should you arrive 10 or more minutes late.  We strive to give you quality time with our providers, and arriving late affects you and other patients whose appointments are after yours.  Also, if you no show three or more times for appointments you may be dismissed from the clinic at the providers discretion.     Again, thank you for choosing [facility].  Our hope is that these requests will decrease the amount of time that you wait before being seen by our physicians.       _____________________________________________________________  Should you have questions after your visit to [facility], please contact our office at [phone] between the hours of 8:30 a.m. and 4:30 p.m.  Voicemails left after 4:30 p.m. will not be returned until the following business day.  For prescription refill requests, have your pharmacy contact our office.

## 2014-02-15 NOTE — Progress Notes (Signed)
[place] NOTE  Patient Care Team: Sharilyn Sites, MD as PCP - General (Family Medicine) Brooks Sailors, RN as Registered Nurse (Oncology) Heath Lark, MD as Consulting Physician (Hematology and Oncology) Eppie Gibson, MD as Attending Physician (Radiation Oncology) Molli Hazard, MD as Consulting Physician (Internal Medicine)  CHIEF COMPLAINTS/PURPOSE OF CONSULTATION:  Squamous Cell Carcinoma of the right tonsil, p16+, T3N2cM0, Stage IVA  Right tonsil and bilateral neck / 70 Gy in 35 fractions to gross disease, 63 Gy in 35 fractions to high risk nodal echelons, and 56 Gy in 35 fractions to intermediate risk nodal echelons 05/02/2013-06/22/2013   Oncology History   Tonsil cancer, HPV positive   Primary site: Pharynx - Oropharynx (Right)   Staging method: AJCC 7th Edition   Clinical free text: HPV positive   Clinical: Stage IVA (T3, N2c, M0) signed by Heath Lark, MD on 04/20/2013  9:29 PM   Summary: Stage IVA (T3, N2c, M0)       Tonsil cancer   02/01/2013 Imaging Ultrasound of the neck revealed bilateral lymphadenopathy in the submandibular region   03/16/2013 Imaging CT scan of the neck show large right tonsil mass measured 3.9 cm in maximum dimension as well as bilateral lymphadenopathy, the largest lymph node measures 31 mm. There is also a left thyroid mass measured 44 mm   03/21/2013 Procedure The patient was seen by ENT with laryngoscopy and biopsy. Pathology is pending   03/25/2013 Imaging PET scan showed large hypermetabolic soft tissue mass in the region of the right palatine tonsil with bilateral cervical hypermetabolic lymphadenopathy indicative of metastatic disease,   04/04/2013 Surgery He underwent placement of Port-A-Cath and feeding tube   04/14/2013 Procedure The patient underwent teeth extraction.   05/02/2013 - 06/13/2013 Chemotherapy The dosage of chemotherapy was interrupted many times due to side effects. His treatment is terminated early due to  severe ulcerated skin toxicity.   05/02/2013 - 06/22/2013 Radiation Therapy The patient completed radiation treatment.   10/18/2013 Imaging PET CT scan showed complete response in the oropharynx. However, he has evidence of new bilateral pulmonary metastasis.   12/16/2013 Imaging Repeat PET CT scan show significant progression of pulmonary metastases    01/25/2014 Pathology Results Squamous Cell Carcinoma, CT guided biopsy of RUL nodule   02/08/2014 -  Chemotherapy Cisplatin/Taxol weekly     HISTORY OF PRESENTING ILLNESS:  [name] 64 y.o. 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 He states he feels great. He is active.  He has done shopping at [employer]. He is up and about daily. He denies nausea, vomiting or diarrhea. No new pain.   MEDICAL HISTORY:  Past Medical History  Diagnosis Date  . Chest pain     10/14  . Urethral stricture     s/p dilitation  . Neuropathy     compression neuropathy right hip;s/p replacement  . Allergy   . Fibromyalgia   . Anxiety   . Depression   . Constipation   . Arthritis     knees, HIps, Hands  . Tonsillar cancer   . Type II diabetes mellitus   . Pneumonia 2012  . Complication of anesthesia     bleeding during intubation 04/04/13 due to friability of right tonsillar cancer  . PEG (percutaneous endoscopic gastrostomy) status   . S/P radiation therapy 05/02/2013-06/22/2013    70 [facility] - Squamous Cell Carcinoma of the tonsil, p16+, T3N2cM0  SURGICAL HISTORY: Past Surgical History  Procedure Laterality Date  . Total hip arthroplasty Right 2000    Dr. Percell Miller  . Cystoscopy      Dr. Karsten Ro  . Portacath placement Right 04/04/2013  . Gastrostomy tube placement  04/04/2013  . Portacath placement N/A 04/04/2013    Procedure: INSERTION PORT-A-CATH;  Surgeon: Ralene Ok, MD;  Location: [place];  Service: General;  Laterality: N/A;  . Laparoscopic gastrostomy N/A 04/04/2013     Procedure: LAPAROSCOPIC GASTROSTOMY TUBE PLACEMENT ;  Surgeon: Ralene Ok, MD;  Location: [place];  Service: General;  Laterality: N/A;  . Nasal hemorrhage control N/A 04/04/2013    Procedure: Control of oropharyngeal hemorrhage;  Surgeon: Ascencion Dike, MD;  Location: [facility] OR;  Service: ENT;  Laterality: N/A;  . Multiple extractions with alveoloplasty N/A 04/14/2013    Procedure: Extraction of tooth #'s 1,2,3,4,5,6,7,8,9,10,11,12,13,14,15,17,18,19,20,21,22,23,24,25,26,27,28,29, 30, 31, and 32 with alveoloplasty and bilateral mandibular tori reductions.;  Surgeon: Lenn Cal, DDS;  Location: [place];  Service: Oral Surgery;  Laterality: N/A;    SOCIAL HISTORY: History   Social History  . Marital Status: Married    Spouse Name: N/A    Number of Children: N/A  . Years of Education: N/A   Occupational History  . Not on file.   Social History Main Topics  . Smoking status: Never Smoker   . Smokeless tobacco: Never Used  . Alcohol Use: No     Comment: Occasional beer  . Drug Use: No  . Sexual Activity: Yes   Other Topics Concern  . Not on file   Social History Narrative  Married for 20 years. They have no children. They have a dog. He has a daughter from a prior relationship, they are estranged. No smoking, worked at [employer]. He has never smoked. Occasional alcohol.  FAMILY HISTORY: Family History  Problem Relation Age of Onset  . Hyperlipidemia Brother   . Cancer Mother     Deceased with leukemia, had uterine ca  . Cancer Cousin     living brain cancer, male   indicated that his mother is deceased. He indicated that his father is deceased. He indicated that his cousin is alive.   His mother had cervical cancer and leukemia. 1 brother who is healthy.  ALLERGIES:  is allergic to neurontin and dilaudid.  MEDICATIONS:  Current Outpatient Prescriptions  Medication Sig Dispense Refill  . acetaminophen (TYLENOL) 500 MG tablet Take 500-1,000 mg by mouth every 4  (four) hours as needed for mild pain.     [name] alprazolam (XANAX) 2 MG tablet Take 2 mg by mouth 3 (three) times daily as needed for sleep or anxiety.     . Cholecalciferol (VITAMIN D3) 5000 UNITS CAPS Take 5,000 Units by mouth daily.    [name] CISPLATIN IV Inject into the vein. Weekly starting 02/07/14    . citalopram (CELEXA) 20 MG tablet Take 1 tablet (20 mg total) by mouth daily. 30 tablet 1  . diphenhydrAMINE (BENADRYL) 25 mg capsule Take 1 capsule (25 mg total) by mouth every 6 (six) hours as needed for itching. 60 capsule 0  . docusate sodium (COLACE) 100 MG capsule Take 100 mg by mouth 2 (two) times daily as needed for mild constipation.    . lidocaine-prilocaine (EMLA) cream Apply a quarter size amount to port site 1 hour prior to chemo. Do not rub in. Cover with plastic wrap. 30 g 3  . ondansetron (ZOFRAN) 8 MG tablet Take 1 tablet (8 mg  total) by mouth every 8 (eight) hours as needed for nausea. 60 tablet 3  . OxyCODONE (OXYCONTIN) 10 mg T12A 12 hr tablet Take 10 mg by mouth every 12 (twelve) hours.    [name] oxyCODONE-acetaminophen (PERCOCET) 10-325 MG per tablet Take 1 tablet by mouth every 4 (four) hours as needed for pain.    [name] PACLitaxel (TAXOL IV) Inject into the vein. Weekly starting 02/07/14    . pregabalin (LYRICA) 150 MG capsule Take 2 capsules (300 mg total) by mouth 2 (two) times daily. (Patient taking differently: Take 300 mg by mouth 2 (two) times daily. 2 in am and 2 at night) 60 capsule 3  . promethazine (PHENERGAN) 25 MG tablet Take 1 tablet (25 mg total) by mouth every 6 (six) hours as needed for nausea. 60 tablet 3  . zolpidem (AMBIEN CR) 6.25 MG CR tablet Take 6.25 mg by mouth at bedtime as needed for sleep.     No current facility-administered medications for this visit.   Facility-Administered Medications Ordered in Other Visits  Medication Dose Route Frequency Provider Last Rate Last Dose  . CISplatin (PLATINOL) 88 mg in sodium chloride 0.9 % 250 mL chemo infusion  40 mg/m2  (Treatment Plan Actual) Intravenous Once Molli Hazard, MD      . heparin lock flush 100 unit/mL  500 Units Intracatheter Once PRN Molli Hazard, MD      . loratadine (CLARITIN) tablet 10 mg  10 mg Oral Daily Molli Hazard, MD   10 mg at 02/15/14 1125  . PACLitaxel (TAXOL) 174 mg in dextrose 5 % 250 mL chemo infusion (</= 80mg /m2)  80 mg/m2 (Treatment Plan Actual) Intravenous Once Molli Hazard, MD 279 mL/hr at 02/15/14 1210 174 mg at 02/15/14 1210  . sodium chloride 0.9 % injection 10 mL  10 mL Intracatheter PRN Molli Hazard, MD   10 mL at 02/15/14 0920    Review of Systems  Constitutional: Negative for fever, chills, weight loss and malaise/fatigue.  HENT: Negative for congestion, hearing loss, nosebleeds, sore throat and tinnitus.   Eyes: Negative for blurred vision, double vision, pain and discharge.  Respiratory: Negative for cough, hemoptysis, sputum production, shortness of breath and wheezing.   Cardiovascular: Negative for chest pain, palpitations, claudication, leg swelling and PND.  Gastrointestinal: Negative for heartburn, nausea, vomiting, abdominal pain, diarrhea, constipation, blood in stool and melena.  Genitourinary: Negative for dysuria, urgency, frequency and hematuria.  Musculoskeletal: Negative for myalgias, joint pain and falls.  Skin: Negative for itching and rash.  Neurological: Negative for dizziness, tingling, tremors, sensory change, speech change, focal weakness, seizures, loss of consciousness, weakness and headaches.  Endo/Heme/Allergies: Does not bruise/bleed easily.  Psychiatric/Behavioral: Negative for depression, suicidal ideas, memory loss and substance abuse. The patient is not nervous/anxious and does not have insomnia.     PHYSICAL EXAMINATION:  ECOG PERFORMANCE STATUS: 0 - Asymptomatic  There were no vitals filed for this visit. There were no vitals filed for this visit.   Physical Exam   Constitutional: He is oriented to person, place, and time and well-developed, well-nourished, and in no distress.  HENT:  Head: Normocephalic and atraumatic.  Nose: Nose normal.  Mouth/Throat: Oropharynx is clear and moist. No oropharyngeal exudate.  XRT changes to the neck, mild edema noted  Eyes: Conjunctivae and EOM are normal. Pupils are equal, round, and reactive to light. Right eye exhibits no discharge. Left eye exhibits no discharge. No scleral icterus.  Neck: Normal range of motion. Neck supple.  No tracheal deviation present. No thyromegaly present.  Cardiovascular: Normal rate, regular rhythm and normal heart sounds.  Exam reveals no gallop and no friction rub.   No murmur heard. Pulmonary/Chest: Effort normal and breath sounds normal. He has no wheezes. He has no rales.  Coarse rhonchi that clears with a cough  Abdominal: Soft. Bowel sounds are normal. He exhibits no distension and no mass. There is no tenderness. There is no rebound and no guarding.  Musculoskeletal: Normal range of motion. He exhibits no edema.  Lymphadenopathy:    He has no cervical adenopathy.  Neurological: He is alert and oriented to person, place, and time. He has normal reflexes. No cranial nerve deficit. Gait normal. Coordination normal.  Skin: Skin is warm and dry. No rash noted.  Psychiatric: Mood, memory, affect and judgment normal.  Nursing note and vitals reviewed.    LABORATORY DATA:  I have reviewed the data as listed Lab Results  Component Value Date   WBC 5.2 02/15/2014   HGB 12.4* 02/15/2014   HCT 37.7* 02/15/2014   MCV 91.5 02/15/2014   PLT 145* 02/15/2014     Chemistry      Component Value Date/Time   NA 138 02/15/2014 0845   NA 142 12/16/2013 1305   K 4.2 02/15/2014 0845   K 4.7 12/16/2013 1305   CL 101 02/15/2014 0845   CO2 30 02/15/2014 0845   CO2 32* 12/16/2013 1305   BUN 14 02/15/2014 0845   BUN 16.6 12/16/2013 1305   CREATININE 0.66 02/15/2014 0845   CREATININE 0.8  12/16/2013 1305      Component Value Date/Time   CALCIUM 9.2 02/15/2014 0845   CALCIUM 9.9 12/16/2013 1305   ALKPHOS 77 02/15/2014 0845   ALKPHOS 84 12/16/2013 1305   AST 19 02/15/2014 0845   AST 20 12/16/2013 1305   ALT 11 02/15/2014 0845   ALT 13 12/16/2013 1305   BILITOT 0.6 02/15/2014 0845   BILITOT 0.45 12/16/2013 1305      ASSESSMENT & PLAN:  Tonsil cancer Pleasant 64 year old male, nonsmoker, with 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 He has been treated with cycle 1 day 1 of cisplatin and Taxol given on a weekly basis and done very well. Randel has absolutely no complaints today he states he has not been limited in his activity he denies nausea vomiting or neuropathic symptoms. He says he is sleeping well and he also notes his wife is sleeping better. He is here today for cycle 1 day 8, we will plan on treating him next week at day 15 pending his counts. Day 22 will be off. We discussed when we would reimage, most likely after 6 cycles of chemotherapy. He was advised to call in the interim with any problems or concerns.    No orders of the defined types were placed in this encounter.    All questions were answered. The patient knows to call the clinic with any problems, questions or concerns.   Molli Hazard, MD MD 02/15/2014 12:50 PM

## 2014-02-17 ENCOUNTER — Telehealth (HOSPITAL_COMMUNITY): Payer: Self-pay

## 2014-02-17 NOTE — Telephone Encounter (Signed)
Rn spoke to [place].  Notified of TSH level. She stated that [name] was confused yesterday and has been having mood swings.  The confusion is better today and she thinks he may have taken his medication wrong.  Message sent to MD/PA.  Advised to call the clinic with further questions or concerns. She verbalized understanding.

## 2014-02-23 ENCOUNTER — Encounter (HOSPITAL_BASED_OUTPATIENT_CLINIC_OR_DEPARTMENT_OTHER): Payer: 59 | Admitting: Hematology & Oncology

## 2014-02-23 ENCOUNTER — Encounter (HOSPITAL_BASED_OUTPATIENT_CLINIC_OR_DEPARTMENT_OTHER): Payer: 59

## 2014-02-23 VITALS — BP 119/71 | HR 68 | Temp 97.6°F | Resp 18

## 2014-02-23 DIAGNOSIS — C099 Malignant neoplasm of tonsil, unspecified: Secondary | ICD-10-CM | POA: Diagnosis not present

## 2014-02-23 DIAGNOSIS — C7801 Secondary malignant neoplasm of right lung: Secondary | ICD-10-CM

## 2014-02-23 DIAGNOSIS — C7802 Secondary malignant neoplasm of left lung: Secondary | ICD-10-CM

## 2014-02-23 DIAGNOSIS — Z5111 Encounter for antineoplastic chemotherapy: Secondary | ICD-10-CM

## 2014-02-23 LAB — CBC WITH DIFFERENTIAL/PLATELET
Basophils Absolute: 0 10*3/uL (ref 0.0–0.1)
Basophils Relative: 1 % (ref 0–1)
EOS PCT: 1 % (ref 0–5)
Eosinophils Absolute: 0 10*3/uL (ref 0.0–0.7)
HCT: 38.7 % — ABNORMAL LOW (ref 39.0–52.0)
HEMOGLOBIN: 12.7 g/dL — AB (ref 13.0–17.0)
LYMPHS ABS: 0.3 10*3/uL — AB (ref 0.7–4.0)
LYMPHS PCT: 10 % — AB (ref 12–46)
MCH: 30.2 pg (ref 26.0–34.0)
MCHC: 32.8 g/dL (ref 30.0–36.0)
MCV: 92.1 fL (ref 78.0–100.0)
MONOS PCT: 12 % (ref 3–12)
Monocytes Absolute: 0.4 10*3/uL (ref 0.1–1.0)
Neutro Abs: 2.3 10*3/uL (ref 1.7–7.7)
Neutrophils Relative %: 76 % (ref 43–77)
PLATELETS: 123 10*3/uL — AB (ref 150–400)
RBC: 4.2 MIL/uL — ABNORMAL LOW (ref 4.22–5.81)
RDW: 14.8 % (ref 11.5–15.5)
WBC: 3 10*3/uL — AB (ref 4.0–10.5)

## 2014-02-23 LAB — COMPREHENSIVE METABOLIC PANEL
ALBUMIN: 3.8 g/dL (ref 3.5–5.2)
ALK PHOS: 72 U/L (ref 39–117)
ALT: 12 U/L (ref 0–53)
AST: 21 U/L (ref 0–37)
Anion gap: 5 (ref 5–15)
BILIRUBIN TOTAL: 0.6 mg/dL (ref 0.3–1.2)
BUN: 11 mg/dL (ref 6–23)
CHLORIDE: 104 mmol/L (ref 96–112)
CO2: 29 mmol/L (ref 19–32)
CREATININE: 0.67 mg/dL (ref 0.50–1.35)
Calcium: 9 mg/dL (ref 8.4–10.5)
GFR calc Af Amer: >90 mL/min (ref >90)
GFR calc non Af Amer: >90 mL/min (ref >90)
Glucose, Bld: 89 mg/dL (ref 70–99)
Potassium: 4 mmol/L (ref 3.5–5.1)
SODIUM: 138 mmol/L (ref 135–145)
Total Protein: 7.3 g/dL (ref 6.0–8.3)

## 2014-02-23 LAB — MAGNESIUM: MAGNESIUM: 2.1 mg/dL (ref 1.5–2.5)

## 2014-02-23 MED ORDER — SODIUM CHLORIDE 0.9 % IV SOLN
Freq: Once | INTRAVENOUS | Status: AC
  Administered 2014-02-23: 10:00:00 via INTRAVENOUS

## 2014-02-23 MED ORDER — OXYCODONE-ACETAMINOPHEN 10-325 MG PO TABS: 1.0000 | ORAL_TABLET | ORAL | Status: DC | PRN

## 2014-02-23 MED ORDER — OXYCODONE HCL ER 10 MG PO T12A: 10.0000 mg | EXTENDED_RELEASE_TABLET | Freq: Two times a day (BID) | ORAL | Status: DC

## 2014-02-23 MED ORDER — HEPARIN SOD (PORK) LOCK FLUSH 100 UNIT/ML IV SOLN
INTRAVENOUS | Status: AC
  Filled 2014-02-23: qty 5

## 2014-02-23 MED ORDER — HEPARIN SOD (PORK) LOCK FLUSH 100 UNIT/ML IV SOLN
500.0000 [IU] | Freq: Once | INTRAVENOUS | Status: AC | PRN
  Administered 2014-02-23: 500 [IU]

## 2014-02-23 MED ORDER — PACLITAXEL CHEMO INJECTION 300 MG/50ML
80.0000 mg/m2 | Freq: Once | INTRAVENOUS | Status: AC
  Administered 2014-02-23: 174 mg via INTRAVENOUS
  Filled 2014-02-23: qty 29

## 2014-02-23 MED ORDER — CISPLATIN CHEMO INJECTION 100MG/100ML
40.0000 mg/m2 | Freq: Once | INTRAVENOUS | Status: AC
  Administered 2014-02-23: 88 mg via INTRAVENOUS
  Filled 2014-02-23: qty 88

## 2014-02-23 MED ORDER — LORATADINE 10 MG PO TABS
10.0000 mg | ORAL_TABLET | Freq: Every day | ORAL | Status: DC
  Administered 2014-02-23: 10 mg via ORAL
  Filled 2014-02-23: qty 1

## 2014-02-23 MED ORDER — SODIUM CHLORIDE 0.9 % IV SOLN
Freq: Once | INTRAVENOUS | Status: AC
  Administered 2014-02-23: 12:00:00 via INTRAVENOUS
  Filled 2014-02-23: qty 5

## 2014-02-23 MED ORDER — DEXAMETHASONE SODIUM PHOSPHATE 10 MG/ML IJ SOLN: 10.0000 mg | Freq: Once | INTRAMUSCULAR | Status: DC

## 2014-02-23 MED ORDER — PALONOSETRON HCL INJECTION 0.25 MG/5ML
0.2500 mg | Freq: Once | INTRAVENOUS | Status: AC
  Administered 2014-02-23: 0.25 mg via INTRAVENOUS
  Filled 2014-02-23: qty 5

## 2014-02-23 MED ORDER — SODIUM CHLORIDE 0.9 % IJ SOLN
10.0000 mL | INTRAMUSCULAR | Status: DC | PRN
  Administered 2014-02-23: 10 mL
  Filled 2014-02-23: qty 10

## 2014-02-23 MED ORDER — POTASSIUM CHLORIDE 2 MEQ/ML IV SOLN
Freq: Once | INTRAVENOUS | Status: AC
  Administered 2014-02-23: 10:00:00 via INTRAVENOUS
  Filled 2014-02-23: qty 10

## 2014-02-23 NOTE — Progress Notes (Signed)
[name] Tolerated chemotherapy well today.  Discharged ambulatory

## 2014-02-23 NOTE — Patient Instructions (Signed)
[name] at [facility] Discharge Instructions  RECOMMENDATIONS MADE BY THE CONSULTANT AND ANY TEST RESULTS WILL BE SENT TO YOUR REFERRING PHYSICIAN.  You are doing very well with chemotherapy. You can take next week off from chemo and enjoy your anniversary! Return as scheduled in 2 weeks for chemotherapy and office visit. Report any issues/concerns as needed prior to appointment. You were given 2 Blade Scheff prescriptions today for Oxycontin and Percocet.  Thank you for choosing [place] at [facility] to provide your oncology and hematology care.  To afford each patient quality time with our provider, please arrive at least 15 minutes before your scheduled appointment time.    You need to re-schedule your appointment should you arrive 10 or more minutes late.  We strive to give you quality time with our providers, and arriving late affects you and other patients whose appointments are after yours.  Also, if you no show three or more times for appointments you may be dismissed from the clinic at the providers discretion.     Again, thank you for choosing [facility].  Our hope is that these requests will decrease the amount of time that you wait before being seen by our physicians.       _____________________________________________________________  Should you have questions after your visit to [facility], please contact our office at [phone] between the hours of 8:30 a.m. and 4:30 p.m.  Voicemails left after 4:30 p.m. will not be returned until the following business day.  For prescription refill requests, have your pharmacy contact our office.

## 2014-02-23 NOTE — Progress Notes (Signed)
[facility] NOTE  Patient Care Team: Sharilyn Sites, MD as PCP - General (Family Medicine) Brooks Sailors, RN as Registered Nurse (Oncology) Heath Lark, MD as Consulting Physician (Hematology and Oncology) Eppie Gibson, MD as Attending Physician (Radiation Oncology) Molli Hazard, MD as Consulting Physician (Internal Medicine)  CHIEF COMPLAINTS/PURPOSE OF CONSULTATION:  Squamous Cell Carcinoma of the right tonsil, p16+, T3N2cM0, Stage IVA  Right tonsil and bilateral neck / 70 Gy in 35 fractions to gross disease, 63 Gy in 35 fractions to high risk nodal echelons, and 56 Gy in 35 fractions to intermediate risk nodal echelons 05/02/2013-06/22/2013   Oncology History   Tonsil cancer, HPV positive   Primary site: Pharynx - Oropharynx (Right)   Staging method: AJCC 7th Edition   Clinical free text: HPV positive   Clinical: Stage IVA (T3, N2c, M0) signed by Heath Lark, MD on 04/20/2013  9:29 PM   Summary: Stage IVA (T3, N2c, M0)       Tonsil cancer   02/01/2013 Imaging Ultrasound of the neck revealed bilateral lymphadenopathy in the submandibular region   03/16/2013 Imaging CT scan of the neck show large right tonsil mass measured 3.9 cm in maximum dimension as well as bilateral lymphadenopathy, the largest lymph node measures 31 mm. There is also a left thyroid mass measured 44 mm   03/21/2013 Procedure The patient was seen by ENT with laryngoscopy and biopsy. Pathology is pending   03/25/2013 Imaging PET scan showed large hypermetabolic soft tissue mass in the region of the right palatine tonsil with bilateral cervical hypermetabolic lymphadenopathy indicative of metastatic disease,   04/04/2013 Surgery He underwent placement of Port-A-Cath and feeding tube   04/14/2013 Procedure The patient underwent teeth extraction.   05/02/2013 - 06/13/2013 Chemotherapy The dosage of chemotherapy was interrupted many times due to side effects. His treatment is terminated early due to  severe ulcerated skin toxicity.   05/02/2013 - 06/22/2013 Radiation Therapy The patient completed radiation treatment.   10/18/2013 Imaging PET CT scan showed complete response in the oropharynx. However, he has evidence of new bilateral pulmonary metastasis.   12/16/2013 Imaging Repeat PET CT scan show significant progression of pulmonary metastases    01/25/2014 Pathology Results Squamous Cell Carcinoma, CT guided biopsy of RUL nodule   02/08/2014 -  Chemotherapy Cisplatin/Taxol weekly     HISTORY OF PRESENTING ILLNESS:  [name] 64 y.o. 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 He is doing fairly well with treatment. His wife says he has some bad days but it is not related. She describes him as [name] and [name]. He states he is just having difficulties at times dealing with his diagnosis. He also enjoys getting outside but the cold weather limits this. He denies nausea, vomiting, fever or chills. It is their anniversary next week and he will take a break from therapy.  MEDICAL HISTORY:  Past Medical History  Diagnosis Date  . Chest pain     10/14  . Urethral stricture     s/p dilitation  . Neuropathy     compression neuropathy right hip;s/p replacement  . Allergy   . Fibromyalgia   . Anxiety   . Depression   . Constipation   . Arthritis     knees, HIps, Hands  . Tonsillar cancer   . Type II diabetes mellitus   . Pneumonia 2012  . Complication of anesthesia     bleeding during intubation  04/04/13 due to friability of right tonsillar cancer  . PEG (percutaneous endoscopic gastrostomy) status   . S/P radiation therapy 05/02/2013-06/22/2013    70 [facility] - Squamous Cell Carcinoma of the tonsil, p16+, T3N2cM0    SURGICAL HISTORY: Past Surgical History  Procedure Laterality Date  . Total hip arthroplasty Right 2000    Dr. Percell Miller  . Cystoscopy      Dr. Karsten Ro  . Portacath placement Right 04/04/2013  . Gastrostomy tube placement  04/04/2013   . Portacath placement N/A 04/04/2013    Procedure: INSERTION PORT-A-CATH;  Surgeon: Ralene Ok, MD;  Location: [facility];  Service: General;  Laterality: N/A;  . Laparoscopic gastrostomy N/A 04/04/2013    Procedure: LAPAROSCOPIC GASTROSTOMY TUBE PLACEMENT ;  Surgeon: Ralene Ok, MD;  Location: [facility];  Service: General;  Laterality: N/A;  . Nasal hemorrhage control N/A 04/04/2013    Procedure: Control of oropharyngeal hemorrhage;  Surgeon: Ascencion Dike, MD;  Location: [facility] OR;  Service: ENT;  Laterality: N/A;  . Multiple extractions with alveoloplasty N/A 04/14/2013    Procedure: Extraction of tooth #'s 1,2,3,4,5,6,7,8,9,10,11,12,13,14,15,17,18,19,20,21,22,23,24,25,26,27,28,29, 30, 31, and 32 with alveoloplasty and bilateral mandibular tori reductions.;  Surgeon: Lenn Cal, DDS;  Location: [facility];  Service: Oral Surgery;  Laterality: N/A;    SOCIAL HISTORY: History   Social History  . Marital Status: Married    Spouse Name: N/A  . Number of Children: N/A  . Years of Education: N/A   Occupational History  . Not on file.   Social History Main Topics  . Smoking status: Never Smoker   . Smokeless tobacco: Never Used  . Alcohol Use: No     Comment: Occasional beer  . Drug Use: No  . Sexual Activity: Yes   Other Topics Concern  . Not on file   Social History Narrative  Married for 20 years. They have no children. They have a dog. He has a daughter from a prior relationship, they are estranged. No smoking, worked at [employer]. He has never smoked. Occasional alcohol.  FAMILY HISTORY: Family History  Problem Relation Age of Onset  . Hyperlipidemia Brother   . Cancer Mother     Deceased with leukemia, had uterine ca  . Cancer Cousin     living brain cancer, male   indicated that his mother is deceased. He indicated that his father is deceased. He indicated that his cousin is alive.   His mother had cervical cancer and leukemia. 1 brother who is healthy.  ALLERGIES:   is allergic to neurontin and dilaudid.  MEDICATIONS:  Current Outpatient Prescriptions  Medication Sig Dispense Refill  . acetaminophen (TYLENOL) 500 MG tablet Take 500-1,000 mg by mouth every 4 (four) hours as needed for mild pain.     [name] alprazolam (XANAX) 2 MG tablet Take 2 mg by mouth 3 (three) times daily as needed for sleep or anxiety.     . Cholecalciferol (VITAMIN D3) 5000 UNITS CAPS Take 5,000 Units by mouth daily.    [name] CISPLATIN IV Inject into the vein. Weekly starting 02/07/14    . citalopram (CELEXA) 20 MG tablet Take 1 tablet (20 mg total) by mouth daily. 30 tablet 1  . diphenhydrAMINE (BENADRYL) 25 mg capsule Take 1 capsule (25 mg total) by mouth every 6 (six) hours as needed for itching. 60 capsule 0  . docusate sodium (COLACE) 100 MG capsule Take 100 mg by mouth 2 (two) times daily as needed for mild constipation.    . lidocaine-prilocaine (  EMLA) cream Apply a quarter size amount to port site 1 hour prior to chemo. Do not rub in. Cover with plastic wrap. 30 g 3  . ondansetron (ZOFRAN) 8 MG tablet Take 1 tablet (8 mg total) by mouth every 8 (eight) hours as needed for nausea. 60 tablet 3  . OxyCODONE (OXYCONTIN) 10 mg T12A 12 hr tablet Take 1 tablet (10 mg total) by mouth every 12 (twelve) hours. 60 tablet 0  . oxyCODONE-acetaminophen (PERCOCET) 10-325 MG per tablet Take 1 tablet by mouth every 4 (four) hours as needed for pain. 30 tablet 0  . PACLitaxel (TAXOL IV) Inject into the vein. Weekly starting 02/07/14    . polyethylene glycol (MIRALAX / GLYCOLAX) packet Take 17 g by mouth as needed.    . pregabalin (LYRICA) 150 MG capsule Take 2 capsules (300 mg total) by mouth 2 (two) times daily. (Patient taking differently: Take 300 mg by mouth 2 (two) times daily. 2 in am and 2 at night) 60 capsule 3  . promethazine (PHENERGAN) 25 MG tablet Take 1 tablet (25 mg total) by mouth every 6 (six) hours as needed for nausea. 60 tablet 3  . zolpidem (AMBIEN CR) 6.25 MG CR tablet Take 6.25 mg by  mouth at bedtime as needed for sleep.     No current facility-administered medications for this visit.    Review of Systems  Constitutional: Negative for fever, chills, weight loss and malaise/fatigue.  HENT: Negative for congestion, hearing loss, nosebleeds, sore throat and tinnitus.   Eyes: Negative for blurred vision, double vision, pain and discharge.  Respiratory: Negative for cough, hemoptysis, sputum production, shortness of breath and wheezing.   Cardiovascular: Negative for chest pain, palpitations, claudication, leg swelling and PND.  Gastrointestinal: Negative for heartburn, nausea, vomiting, abdominal pain, diarrhea, constipation, blood in stool and melena.  Genitourinary: Negative for dysuria, urgency, frequency and hematuria.  Musculoskeletal: Negative for myalgias, joint pain and falls.  Skin: Negative for itching and rash.  Neurological: Negative for dizziness, tingling, tremors, sensory change, speech change, focal weakness, seizures, loss of consciousness, weakness and headaches.  Endo/Heme/Allergies: Does not bruise/bleed easily.  Psychiatric/Behavioral: Negative for depression, suicidal ideas, memory loss and substance abuse. The patient is not nervous/anxious and does not have insomnia.     PHYSICAL EXAMINATION:  ECOG PERFORMANCE STATUS: 0 - Asymptomatic  Filed Vitals:   02/23/14 0847  BP: 119/71  Pulse: 68  Temp: 97.6 F (36.4 C)  Resp: 18   There were no vitals filed for this visit.   Physical Exam  Constitutional: He is oriented to person, place, and time and well-developed, well-nourished, and in no distress.  HENT:  Head: Normocephalic and atraumatic.  Nose: Nose normal.  Mouth/Throat: Oropharynx is clear and moist. No oropharyngeal exudate.  XRT changes to the neck, mild edema noted  Eyes: Conjunctivae and EOM are normal. Pupils are equal, round, and reactive to light. Right eye exhibits no discharge. Left eye exhibits no discharge. No scleral  icterus.  Neck: Normal range of motion. Neck supple. No tracheal deviation present. No thyromegaly present.  Cardiovascular: Normal rate, regular rhythm and normal heart sounds.  Exam reveals no gallop and no friction rub.   No murmur heard. Pulmonary/Chest: Effort normal and breath sounds normal. He has no wheezes. He has no rales.  Coarse rhonchi that clears with a cough  Abdominal: Soft. Bowel sounds are normal. He exhibits no distension and no mass. There is no tenderness. There is no rebound and no guarding.  Musculoskeletal:  Normal range of motion. He exhibits no edema.  Lymphadenopathy:    He has no cervical adenopathy.  Neurological: He is alert and oriented to person, place, and time. He has normal reflexes. No cranial nerve deficit. Gait normal. Coordination normal.  Skin: Skin is warm and dry. No rash noted.  Psychiatric: Mood, memory, affect and judgment normal.  Nursing note and vitals reviewed.    LABORATORY DATA:  I have reviewed the data as listed Lab Results  Component Value Date   WBC 3.0* 02/23/2014   HGB 12.7* 02/23/2014   HCT 38.7* 02/23/2014   MCV 92.1 02/23/2014   PLT 123* 02/23/2014     Chemistry      Component Value Date/Time   NA 138 02/23/2014 0904   NA 142 12/16/2013 1305   K 4.0 02/23/2014 0904   K 4.7 12/16/2013 1305   CL 104 02/23/2014 0904   CO2 29 02/23/2014 0904   CO2 32* 12/16/2013 1305   BUN 11 02/23/2014 0904   BUN 16.6 12/16/2013 1305   CREATININE 0.67 02/23/2014 0904   CREATININE 0.8 12/16/2013 1305      Component Value Date/Time   CALCIUM 9.0 02/23/2014 0904   CALCIUM 9.9 12/16/2013 1305   ALKPHOS 72 02/23/2014 0904   ALKPHOS 84 12/16/2013 1305   AST 21 02/23/2014 0904   AST 20 12/16/2013 1305   ALT 12 02/23/2014 0904   ALT 13 12/16/2013 1305   BILITOT 0.6 02/23/2014 0904   BILITOT 0.45 12/16/2013 1305      ASSESSMENT & PLAN:  Tonsil cancer As an 64 year old nonsmoking male with stage IV squamous cell carcinoma of the  head and neck. His disease is HPV positive. He failed concurrent chemotherapy radiation. He is on weekly cisplatin and Taxol with excellent tolerance. This is his third week of treatment and other than difficulties with his mood he seems to be doing fairly well. He is on an antidepressant.  He will be off of treatment next week as it is their anniversary. We have also discussed treating him 3 weeks on 1 week off. We discussed repeat imaging and I advised them I would like to get at least 9-12 cycles of chemotherapy and before repeating imaging. He seemed to understand. They know to call prior to his next follow-up in 2 weeks with any problems or concerns.    No orders of the defined types were placed in this encounter.    All questions were answered. The patient knows to call the clinic with any problems, questions or concerns.   Molli Hazard, MD MD 02/26/2014 7:42 AM

## 2014-02-23 NOTE — Patient Instructions (Signed)
[facility] Discharge Instructions for Patients Receiving Chemotherapy  Today you received the following chemotherapy agents taxol, cisplatin  To help prevent nausea and vomiting after your treatment, we encourage you to take your nausea medication  If you develop nausea and vomiting that is not controlled by your nausea medication, call the clinic. If it is after clinic hours your family physician or the after hours number for the clinic or go to the Emergency Department.   BELOW ARE SYMPTOMS THAT SHOULD BE REPORTED IMMEDIATELY:  *FEVER GREATER THAN 101.0 F  *CHILLS WITH OR WITHOUT FEVER  NAUSEA AND VOMITING THAT IS NOT CONTROLLED WITH YOUR NAUSEA MEDICATION  *UNUSUAL SHORTNESS OF BREATH  *UNUSUAL BRUISING OR BLEEDING  TENDERNESS IN MOUTH AND THROAT WITH OR WITHOUT PRESENCE OF ULCERS  *URINARY PROBLEMS  *BOWEL PROBLEMS  UNUSUAL RASH Items with * indicate a potential emergency and should be followed up as soon as possible.  One of the nurses will contact you 24 hours after your treatment. Please let the nurse know about any problems that you may have experienced. Feel free to call the clinic you have any questions or concerns. The clinic phone number is [phone].   I have been informed and understand all the instructions given to me. I know to contact the clinic, my physician, or go to the Emergency Department if any problems should occur. I do not have any questions at this time, but understand that I may call the clinic during office hours or the Patient Navigator at [phone] should I have any questions or need assistance in obtaining follow up care.

## 2014-02-26 ENCOUNTER — Encounter (HOSPITAL_COMMUNITY): Payer: Self-pay | Admitting: Hematology & Oncology

## 2014-02-26 NOTE — Assessment & Plan Note (Signed)
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 His disease is HPV positive. He failed concurrent chemotherapy radiation. He is on weekly cisplatin and Taxol with excellent tolerance. This is his third week of treatment and other than difficulties with his mood he seems to be doing fairly well. He is on an antidepressant.  He will be off of treatment next week as it is their anniversary. We have also discussed treating him 3 weeks on 1 week off. We discussed repeat imaging and I advised them I would like to get at least 9-12 cycles of chemotherapy and before repeating imaging. He seemed to understand. They know to call prior to his next follow-up in 2 weeks with any problems or concerns.

## 2014-02-28 ENCOUNTER — Inpatient Hospital Stay (HOSPITAL_COMMUNITY): Payer: Self-pay

## 2014-03-02 ENCOUNTER — Ambulatory Visit (HOSPITAL_COMMUNITY): Payer: Self-pay | Admitting: Hematology & Oncology

## 2014-03-02 ENCOUNTER — Inpatient Hospital Stay (HOSPITAL_COMMUNITY): Payer: Self-pay

## 2014-03-09 ENCOUNTER — Encounter (HOSPITAL_BASED_OUTPATIENT_CLINIC_OR_DEPARTMENT_OTHER): Payer: 59 | Admitting: Hematology & Oncology

## 2014-03-09 ENCOUNTER — Encounter (HOSPITAL_BASED_OUTPATIENT_CLINIC_OR_DEPARTMENT_OTHER): Payer: 59

## 2014-03-09 ENCOUNTER — Encounter (HOSPITAL_COMMUNITY): Payer: Self-pay | Admitting: Hematology & Oncology

## 2014-03-09 DIAGNOSIS — C099 Malignant neoplasm of tonsil, unspecified: Secondary | ICD-10-CM

## 2014-03-09 DIAGNOSIS — C7801 Secondary malignant neoplasm of right lung: Secondary | ICD-10-CM

## 2014-03-09 DIAGNOSIS — Z5111 Encounter for antineoplastic chemotherapy: Secondary | ICD-10-CM

## 2014-03-09 DIAGNOSIS — C7802 Secondary malignant neoplasm of left lung: Secondary | ICD-10-CM

## 2014-03-09 LAB — CBC WITH DIFFERENTIAL/PLATELET
Basophils Absolute: 0 10*3/uL (ref 0.0–0.1)
Basophils Relative: 1 % (ref 0–1)
Eosinophils Absolute: 0.1 10*3/uL (ref 0.0–0.7)
Eosinophils Relative: 2 % (ref 0–5)
HCT: 37.7 % — ABNORMAL LOW (ref 39.0–52.0)
Hemoglobin: 12.4 g/dL — ABNORMAL LOW (ref 13.0–17.0)
Lymphocytes Relative: 17 % (ref 12–46)
Lymphs Abs: 0.5 10*3/uL — ABNORMAL LOW (ref 0.7–4.0)
MCH: 30.3 pg (ref 26.0–34.0)
MCHC: 32.9 g/dL (ref 30.0–36.0)
MCV: 92.2 fL (ref 78.0–100.0)
Monocytes Absolute: 0.5 10*3/uL (ref 0.1–1.0)
Monocytes Relative: 16 % — ABNORMAL HIGH (ref 3–12)
NEUTROS ABS: 1.9 10*3/uL (ref 1.7–7.7)
NEUTROS PCT: 64 % (ref 43–77)
Platelets: 141 10*3/uL — ABNORMAL LOW (ref 150–400)
RBC: 4.09 MIL/uL — ABNORMAL LOW (ref 4.22–5.81)
RDW: 15.6 % — AB (ref 11.5–15.5)
WBC: 3 10*3/uL — ABNORMAL LOW (ref 4.0–10.5)

## 2014-03-09 LAB — COMPREHENSIVE METABOLIC PANEL
ALT: 12 U/L (ref 0–53)
AST: 19 U/L (ref 0–37)
Albumin: 3.9 g/dL (ref 3.5–5.2)
Alkaline Phosphatase: 77 U/L (ref 39–117)
Anion gap: 4 — ABNORMAL LOW (ref 5–15)
BUN: 11 mg/dL (ref 6–23)
CALCIUM: 9 mg/dL (ref 8.4–10.5)
CO2: 30 mmol/L (ref 19–32)
Chloride: 105 mmol/L (ref 96–112)
Creatinine, Ser: 0.65 mg/dL (ref 0.50–1.35)
GFR calc non Af Amer: >90 mL/min (ref >90)
GLUCOSE: 94 mg/dL (ref 70–99)
Potassium: 3.8 mmol/L (ref 3.5–5.1)
Sodium: 139 mmol/L (ref 135–145)
TOTAL PROTEIN: 7.5 g/dL (ref 6.0–8.3)
Total Bilirubin: 0.5 mg/dL (ref 0.3–1.2)

## 2014-03-09 MED ORDER — SODIUM CHLORIDE 0.9 % IV SOLN
Freq: Once | INTRAVENOUS | Status: AC
  Administered 2014-03-09: 11:00:00 via INTRAVENOUS
  Filled 2014-03-09: qty 5

## 2014-03-09 MED ORDER — PACLITAXEL CHEMO INJECTION 300 MG/50ML
80.0000 mg/m2 | Freq: Once | INTRAVENOUS | Status: AC
  Administered 2014-03-09: 174 mg via INTRAVENOUS
  Filled 2014-03-09: qty 29

## 2014-03-09 MED ORDER — HEPARIN SOD (PORK) LOCK FLUSH 100 UNIT/ML IV SOLN
500.0000 [IU] | Freq: Once | INTRAVENOUS | Status: AC | PRN
  Administered 2014-03-09: 500 [IU]

## 2014-03-09 MED ORDER — PALONOSETRON HCL INJECTION 0.25 MG/5ML
0.2500 mg | Freq: Once | INTRAVENOUS | Status: AC
  Administered 2014-03-09: 0.25 mg via INTRAVENOUS
  Filled 2014-03-09: qty 5

## 2014-03-09 MED ORDER — DEXAMETHASONE SODIUM PHOSPHATE 10 MG/ML IJ SOLN: 10.0000 mg | Freq: Once | INTRAMUSCULAR | Status: DC

## 2014-03-09 MED ORDER — SODIUM CHLORIDE 0.9 % IV SOLN
Freq: Once | INTRAVENOUS | Status: AC
  Administered 2014-03-09: 09:00:00 via INTRAVENOUS

## 2014-03-09 MED ORDER — POTASSIUM CHLORIDE 2 MEQ/ML IV SOLN
Freq: Once | INTRAVENOUS | Status: AC
  Administered 2014-03-09: 09:00:00 via INTRAVENOUS
  Filled 2014-03-09: qty 10

## 2014-03-09 MED ORDER — HEPARIN SOD (PORK) LOCK FLUSH 100 UNIT/ML IV SOLN
INTRAVENOUS | Status: AC
  Filled 2014-03-09: qty 5

## 2014-03-09 MED ORDER — SODIUM CHLORIDE 0.9 % IJ SOLN: 10.0000 mL | INTRAMUSCULAR | Status: DC | PRN

## 2014-03-09 MED ORDER — SODIUM CHLORIDE 0.9 % IV SOLN
40.0000 mg/m2 | Freq: Once | INTRAVENOUS | Status: AC
  Administered 2014-03-09: 88 mg via INTRAVENOUS
  Filled 2014-03-09: qty 88

## 2014-03-09 MED ORDER — LORATADINE 10 MG PO TABS
10.0000 mg | ORAL_TABLET | Freq: Every day | ORAL | Status: DC
  Administered 2014-03-09: 10 mg via ORAL
  Filled 2014-03-09: qty 1

## 2014-03-09 NOTE — Patient Instructions (Signed)
[facility] Discharge Instructions for Patients Receiving Chemotherapy  Today you received the following chemotherapy agents:  Taxol and Cisplatin Return as scheduled for chemotherapy and office visits.   If you develop nausea and vomiting, or diarrhea that is not controlled by your medication, call the clinic.  The clinic phone number is [phone]. Office hours are Monday-Friday 8:30am-5:00pm.  BELOW ARE SYMPTOMS THAT SHOULD BE REPORTED IMMEDIATELY:  *FEVER GREATER THAN 101.0 F  *CHILLS WITH OR WITHOUT FEVER  NAUSEA AND VOMITING THAT IS NOT CONTROLLED WITH YOUR NAUSEA MEDICATION  *UNUSUAL SHORTNESS OF BREATH  *UNUSUAL BRUISING OR BLEEDING  TENDERNESS IN MOUTH AND THROAT WITH OR WITHOUT PRESENCE OF ULCERS  *URINARY PROBLEMS  *BOWEL PROBLEMS  UNUSUAL RASH Items with * indicate a potential emergency and should be followed up as soon as possible. If you have an emergency after office hours please contact your primary care physician or go to the nearest emergency department.  Please call the clinic during office hours if you have any questions or concerns.   You may also contact the Patient Navigator at [phone] should you have any questions or need assistance in obtaining follow up care. _____________________________________________________________________ Have you asked about our STAR program?    STAR stands for Survivorship Training and Rehabilitation, and this is a nationally recognized cancer care program that focuses on survivorship and rehabilitation.  Cancer and cancer treatments may cause problems, such as, pain, making you feel tired and keeping you from doing the things that you need or want to do. Cancer rehabilitation can help. Our goal is to reduce these troubling effects and help you have the best quality of life possible.  You may receive a survey from a nurse that asks questions about your current state of health.  Based on the survey  results, all eligible patients will be referred to the [facility] program for an evaluation so we can better serve you! A frequently asked questions sheet is available upon request.

## 2014-03-09 NOTE — Progress Notes (Signed)
Romelo M Gartin Tolerated chemotherapy well today.  Discharged ambulatory

## 2014-03-09 NOTE — Patient Instructions (Signed)
[facility] Discharge Instructions for Patients Receiving Chemotherapy  Today you received the following chemotherapy agents:  Taxol and Cisplatin Return as scheduled for chemotherapy and office visits.   If you develop nausea and vomiting, or diarrhea that is not controlled by your medication, call the clinic.  The clinic phone number is [phone]. Office hours are Monday-Friday 8:30am-5:00pm.  BELOW ARE SYMPTOMS THAT SHOULD BE REPORTED IMMEDIATELY:  *FEVER GREATER THAN 101.0 F  *CHILLS WITH OR WITHOUT FEVER  NAUSEA AND VOMITING THAT IS NOT CONTROLLED WITH YOUR NAUSEA MEDICATION  *UNUSUAL SHORTNESS OF BREATH  *UNUSUAL BRUISING OR BLEEDING  TENDERNESS IN MOUTH AND THROAT WITH OR WITHOUT PRESENCE OF ULCERS  *URINARY PROBLEMS  *BOWEL PROBLEMS  UNUSUAL RASH Items with * indicate a potential emergency and should be followed up as soon as possible. If you have an emergency after office hours please contact your primary care physician or go to the nearest emergency department.  Please call the clinic during office hours if you have any questions or concerns.   You may also contact the Patient Navigator at [phone] should you have any questions or need assistance in obtaining follow up care. _____________________________________________________________________ Have you asked about our STAR program?    STAR stands for Survivorship Training and Rehabilitation, and this is a nationally recognized cancer care program that focuses on survivorship and rehabilitation.  Cancer and cancer treatments may cause problems, such as, pain, making you feel tired and keeping you from doing the things that you need or want to do. Cancer rehabilitation can help. Our goal is to reduce these troubling effects and help you have the best quality of life possible.  You may receive a survey from a nurse that asks questions about your current state of health.  Based on the survey  results, all eligible patients will be referred to the [facility] program for an evaluation so we can better serve you! A frequently asked questions sheet is available upon request.

## 2014-03-16 ENCOUNTER — Inpatient Hospital Stay (HOSPITAL_COMMUNITY): Payer: Self-pay

## 2014-03-16 ENCOUNTER — Encounter (HOSPITAL_COMMUNITY): Payer: 59 | Attending: Hematology & Oncology

## 2014-03-16 ENCOUNTER — Encounter (HOSPITAL_COMMUNITY): Payer: Self-pay

## 2014-03-16 DIAGNOSIS — C7801 Secondary malignant neoplasm of right lung: Secondary | ICD-10-CM

## 2014-03-16 DIAGNOSIS — C099 Malignant neoplasm of tonsil, unspecified: Secondary | ICD-10-CM | POA: Insufficient documentation

## 2014-03-16 DIAGNOSIS — Z9221 Personal history of antineoplastic chemotherapy: Secondary | ICD-10-CM | POA: Insufficient documentation

## 2014-03-16 DIAGNOSIS — Z923 Personal history of irradiation: Secondary | ICD-10-CM | POA: Insufficient documentation

## 2014-03-16 DIAGNOSIS — Z5111 Encounter for antineoplastic chemotherapy: Secondary | ICD-10-CM

## 2014-03-16 DIAGNOSIS — C7802 Secondary malignant neoplasm of left lung: Secondary | ICD-10-CM | POA: Insufficient documentation

## 2014-03-16 LAB — CBC WITH DIFFERENTIAL/PLATELET
BASOS ABS: 0 10*3/uL (ref 0.0–0.1)
BASOS PCT: 1 % (ref 0–1)
Eosinophils Absolute: 0 10*3/uL (ref 0.0–0.7)
Eosinophils Relative: 1 % (ref 0–5)
HEMATOCRIT: 34.8 % — AB (ref 39.0–52.0)
Hemoglobin: 11.4 g/dL — ABNORMAL LOW (ref 13.0–17.0)
Lymphocytes Relative: 8 % — ABNORMAL LOW (ref 12–46)
Lymphs Abs: 0.3 10*3/uL — ABNORMAL LOW (ref 0.7–4.0)
MCH: 30.3 pg (ref 26.0–34.0)
MCHC: 32.8 g/dL (ref 30.0–36.0)
MCV: 92.6 fL (ref 78.0–100.0)
Monocytes Absolute: 0.2 10*3/uL (ref 0.1–1.0)
Monocytes Relative: 6 % (ref 3–12)
Neutro Abs: 3.2 10*3/uL (ref 1.7–7.7)
Neutrophils Relative %: 85 % — ABNORMAL HIGH (ref 43–77)
Platelets: 112 10*3/uL — ABNORMAL LOW (ref 150–400)
RBC: 3.76 MIL/uL — AB (ref 4.22–5.81)
RDW: 15.3 % (ref 11.5–15.5)
WBC: 3.8 10*3/uL — ABNORMAL LOW (ref 4.0–10.5)

## 2014-03-16 LAB — COMPREHENSIVE METABOLIC PANEL
ALT: 11 U/L (ref 0–53)
AST: 18 U/L (ref 0–37)
Albumin: 3.7 g/dL (ref 3.5–5.2)
Alkaline Phosphatase: 69 U/L (ref 39–117)
Anion gap: 6 (ref 5–15)
BILIRUBIN TOTAL: 0.5 mg/dL (ref 0.3–1.2)
BUN: 16 mg/dL (ref 6–23)
CALCIUM: 8.9 mg/dL (ref 8.4–10.5)
CO2: 30 mmol/L (ref 19–32)
Chloride: 102 mmol/L (ref 96–112)
Creatinine, Ser: 0.64 mg/dL (ref 0.50–1.35)
GFR calc Af Amer: >90 mL/min (ref >90)
Glucose, Bld: 99 mg/dL (ref 70–99)
Potassium: 4 mmol/L (ref 3.5–5.1)
SODIUM: 138 mmol/L (ref 135–145)
Total Protein: 7.3 g/dL (ref 6.0–8.3)

## 2014-03-16 MED ORDER — DEXAMETHASONE SODIUM PHOSPHATE 10 MG/ML IJ SOLN: 10.0000 mg | Freq: Once | INTRAMUSCULAR | Status: DC

## 2014-03-16 MED ORDER — FOSAPREPITANT DIMEGLUMINE INJECTION 150 MG
Freq: Once | INTRAVENOUS | Status: AC
  Administered 2014-03-16: 11:00:00 via INTRAVENOUS
  Filled 2014-03-16: qty 5

## 2014-03-16 MED ORDER — SODIUM CHLORIDE 0.9 % IJ SOLN
10.0000 mL | INTRAMUSCULAR | Status: DC | PRN
  Administered 2014-03-16: 10 mL
  Filled 2014-03-16: qty 10

## 2014-03-16 MED ORDER — PACLITAXEL CHEMO INJECTION 300 MG/50ML
80.0000 mg/m2 | Freq: Once | INTRAVENOUS | Status: AC
  Administered 2014-03-16: 174 mg via INTRAVENOUS
  Filled 2014-03-16: qty 29

## 2014-03-16 MED ORDER — DEXTROSE-NACL 5-0.45 % IV SOLN
Freq: Once | INTRAVENOUS | Status: AC
  Administered 2014-03-16: 09:00:00 via INTRAVENOUS
  Filled 2014-03-16: qty 10

## 2014-03-16 MED ORDER — SODIUM CHLORIDE 0.9 % IV SOLN
Freq: Once | INTRAVENOUS | Status: AC
  Administered 2014-03-16: 11:00:00 via INTRAVENOUS

## 2014-03-16 MED ORDER — LORATADINE 10 MG PO TABS
10.0000 mg | ORAL_TABLET | Freq: Every day | ORAL | Status: DC
  Administered 2014-03-16: 10 mg via ORAL
  Filled 2014-03-16: qty 1

## 2014-03-16 MED ORDER — HEPARIN SOD (PORK) LOCK FLUSH 100 UNIT/ML IV SOLN
500.0000 [IU] | Freq: Once | INTRAVENOUS | Status: AC | PRN
  Administered 2014-03-16: 500 [IU]
  Filled 2014-03-16: qty 5

## 2014-03-16 MED ORDER — PALONOSETRON HCL INJECTION 0.25 MG/5ML
0.2500 mg | Freq: Once | INTRAVENOUS | Status: AC
  Administered 2014-03-16: 0.25 mg via INTRAVENOUS
  Filled 2014-03-16: qty 5

## 2014-03-16 MED ORDER — SODIUM CHLORIDE 0.9 % IV SOLN
40.0000 mg/m2 | Freq: Once | INTRAVENOUS | Status: AC
  Administered 2014-03-16: 88 mg via INTRAVENOUS
  Filled 2014-03-16: qty 88

## 2014-03-16 NOTE — Patient Instructions (Signed)
[facility] Discharge Instructions for Patients Receiving Chemotherapy  Today you received the following chemotherapy agents:  Taxol and cisplatin  Please call the clinic tomorrow if you are having nausea and are unable to drink fluids. Return in one week for office visit and chemotherapy.  If you develop nausea and vomiting, or diarrhea that is not controlled by your medication, call the clinic.  The clinic phone number is [phone]. Office hours are Monday-Friday 8:30am-5:00pm.  BELOW ARE SYMPTOMS THAT SHOULD BE REPORTED IMMEDIATELY:  *FEVER GREATER THAN 101.0 F  *CHILLS WITH OR WITHOUT FEVER  NAUSEA AND VOMITING THAT IS NOT CONTROLLED WITH YOUR NAUSEA MEDICATION  *UNUSUAL SHORTNESS OF BREATH  *UNUSUAL BRUISING OR BLEEDING  TENDERNESS IN MOUTH AND THROAT WITH OR WITHOUT PRESENCE OF ULCERS  *URINARY PROBLEMS  *BOWEL PROBLEMS  UNUSUAL RASH Items with * indicate a potential emergency and should be followed up as soon as possible. If you have an emergency after office hours please contact your primary care physician or go to the nearest emergency department.  Please call the clinic during office hours if you have any questions or concerns.   You may also contact the Patient Navigator at [phone] should you have any questions or need assistance in obtaining follow up care. _____________________________________________________________________ Have you asked about our STAR program?    STAR stands for Survivorship Training and Rehabilitation, and this is a nationally recognized cancer care program that focuses on survivorship and rehabilitation.  Cancer and cancer treatments may cause problems, such as, pain, making you feel tired and keeping you from doing the things that you need or want to do. Cancer rehabilitation can help. Our goal is to reduce these troubling effects and help you have the best quality of life possible.  You may receive a survey from a  nurse that asks questions about your current state of health.  Based on the survey results, all eligible patients will be referred to the [facility] program for an evaluation so we can better serve you! A frequently asked questions sheet is available upon request.

## 2014-03-16 NOTE — Progress Notes (Signed)
1505:  Tolerated tx w/o adverse reaction; a&ox4; VSS.  Left in c/o spouse for transport home - discharged ambulatory.

## 2014-03-21 ENCOUNTER — Inpatient Hospital Stay (HOSPITAL_COMMUNITY): Payer: Self-pay

## 2014-03-23 ENCOUNTER — Encounter (HOSPITAL_BASED_OUTPATIENT_CLINIC_OR_DEPARTMENT_OTHER): Payer: 59 | Admitting: Hematology & Oncology

## 2014-03-23 ENCOUNTER — Ambulatory Visit (HOSPITAL_COMMUNITY): Payer: Self-pay | Admitting: Hematology & Oncology

## 2014-03-23 ENCOUNTER — Inpatient Hospital Stay (HOSPITAL_COMMUNITY): Payer: Self-pay

## 2014-03-23 ENCOUNTER — Encounter (HOSPITAL_COMMUNITY): Payer: Self-pay | Admitting: Hematology & Oncology

## 2014-03-23 ENCOUNTER — Encounter (HOSPITAL_BASED_OUTPATIENT_CLINIC_OR_DEPARTMENT_OTHER): Payer: 59

## 2014-03-23 VITALS — BP 116/77 | HR 71 | Temp 97.9°F | Resp 18 | Wt 202.5 lb

## 2014-03-23 DIAGNOSIS — C7802 Secondary malignant neoplasm of left lung: Secondary | ICD-10-CM

## 2014-03-23 DIAGNOSIS — Z5111 Encounter for antineoplastic chemotherapy: Secondary | ICD-10-CM

## 2014-03-23 DIAGNOSIS — R600 Localized edema: Secondary | ICD-10-CM

## 2014-03-23 DIAGNOSIS — C7801 Secondary malignant neoplasm of right lung: Secondary | ICD-10-CM

## 2014-03-23 DIAGNOSIS — C099 Malignant neoplasm of tonsil, unspecified: Secondary | ICD-10-CM

## 2014-03-23 LAB — COMPREHENSIVE METABOLIC PANEL
ALT: 12 U/L (ref 0–53)
AST: 19 U/L (ref 0–37)
Albumin: 3.8 g/dL (ref 3.5–5.2)
Alkaline Phosphatase: 68 U/L (ref 39–117)
Anion gap: 6 (ref 5–15)
BILIRUBIN TOTAL: 0.7 mg/dL (ref 0.3–1.2)
BUN: 15 mg/dL (ref 6–23)
CHLORIDE: 105 mmol/L (ref 96–112)
CO2: 28 mmol/L (ref 19–32)
CREATININE: 0.63 mg/dL (ref 0.50–1.35)
Calcium: 9 mg/dL (ref 8.4–10.5)
GFR calc non Af Amer: >90 mL/min (ref >90)
Glucose, Bld: 97 mg/dL (ref 70–99)
Potassium: 3.8 mmol/L (ref 3.5–5.1)
Sodium: 139 mmol/L (ref 135–145)
TOTAL PROTEIN: 7 g/dL (ref 6.0–8.3)

## 2014-03-23 LAB — CBC WITH DIFFERENTIAL/PLATELET
Basophils Absolute: 0 10*3/uL (ref 0.0–0.1)
Basophils Relative: 1 % (ref 0–1)
EOS ABS: 0 10*3/uL (ref 0.0–0.7)
Eosinophils Relative: 1 % (ref 0–5)
HCT: 34 % — ABNORMAL LOW (ref 39.0–52.0)
HEMOGLOBIN: 11.3 g/dL — AB (ref 13.0–17.0)
Lymphocytes Relative: 14 % (ref 12–46)
Lymphs Abs: 0.3 10*3/uL — ABNORMAL LOW (ref 0.7–4.0)
MCH: 30.5 pg (ref 26.0–34.0)
MCHC: 33.2 g/dL (ref 30.0–36.0)
MCV: 91.9 fL (ref 78.0–100.0)
MONO ABS: 0.2 10*3/uL (ref 0.1–1.0)
Monocytes Relative: 9 % (ref 3–12)
NEUTROS ABS: 1.7 10*3/uL (ref 1.7–7.7)
NEUTROS PCT: 76 % (ref 43–77)
Platelets: 118 10*3/uL — ABNORMAL LOW (ref 150–400)
RBC: 3.7 MIL/uL — AB (ref 4.22–5.81)
RDW: 14.9 % (ref 11.5–15.5)
WBC: 2.2 10*3/uL — ABNORMAL LOW (ref 4.0–10.5)

## 2014-03-23 MED ORDER — SODIUM CHLORIDE 0.9 % IV SOLN
Freq: Once | INTRAVENOUS | Status: AC
  Administered 2014-03-23: 09:00:00 via INTRAVENOUS

## 2014-03-23 MED ORDER — HEPARIN SOD (PORK) LOCK FLUSH 100 UNIT/ML IV SOLN
500.0000 [IU] | Freq: Once | INTRAVENOUS | Status: AC | PRN
  Administered 2014-03-23: 500 [IU]
  Filled 2014-03-23: qty 5

## 2014-03-23 MED ORDER — POTASSIUM CHLORIDE 2 MEQ/ML IV SOLN
Freq: Once | INTRAVENOUS | Status: AC
  Administered 2014-03-23: 09:00:00 via INTRAVENOUS
  Filled 2014-03-23: qty 10

## 2014-03-23 MED ORDER — DEXAMETHASONE SODIUM PHOSPHATE 10 MG/ML IJ SOLN: 10.0000 mg | Freq: Once | INTRAMUSCULAR | Status: DC

## 2014-03-23 MED ORDER — SODIUM CHLORIDE 0.9 % IV SOLN
40.0000 mg/m2 | Freq: Once | INTRAVENOUS | Status: AC
  Administered 2014-03-23: 88 mg via INTRAVENOUS
  Filled 2014-03-23: qty 88

## 2014-03-23 MED ORDER — PACLITAXEL CHEMO INJECTION 300 MG/50ML
80.0000 mg/m2 | Freq: Once | INTRAVENOUS | Status: AC
  Administered 2014-03-23: 174 mg via INTRAVENOUS
  Filled 2014-03-23: qty 29

## 2014-03-23 MED ORDER — SODIUM CHLORIDE 0.9 % IJ SOLN
10.0000 mL | INTRAMUSCULAR | Status: DC | PRN
  Administered 2014-03-23: 10 mL
  Filled 2014-03-23: qty 10

## 2014-03-23 MED ORDER — SODIUM CHLORIDE 0.9 % IV SOLN
Freq: Once | INTRAVENOUS | Status: AC
  Administered 2014-03-23: 11:00:00 via INTRAVENOUS
  Filled 2014-03-23: qty 5

## 2014-03-23 MED ORDER — LORATADINE 10 MG PO TABS
10.0000 mg | ORAL_TABLET | Freq: Every day | ORAL | Status: DC
  Administered 2014-03-23: 10 mg via ORAL
  Filled 2014-03-23: qty 1

## 2014-03-23 MED ORDER — PALONOSETRON HCL INJECTION 0.25 MG/5ML
0.2500 mg | Freq: Once | INTRAVENOUS | Status: AC
  Administered 2014-03-23: 0.25 mg via INTRAVENOUS
  Filled 2014-03-23: qty 5

## 2014-03-23 NOTE — Patient Instructions (Signed)
[facility] Discharge Instructions for Patients Receiving Chemotherapy  Today you received the following chemotherapy agents Cycle 6 Taxol and Cisplatin.  To help prevent nausea and vomiting after your treatment, we encourage you to take your nausea medication as instructed. If you develop nausea and vomiting that is not controlled by your nausea medication, call the clinic. If it is after clinic hours your family physician or the after hours number for the clinic or go to the Emergency Department. BELOW ARE SYMPTOMS THAT SHOULD BE REPORTED IMMEDIATELY:  *FEVER GREATER THAN 101.0 F  *CHILLS WITH OR WITHOUT FEVER  NAUSEA AND VOMITING THAT IS NOT CONTROLLED WITH YOUR NAUSEA MEDICATION  *UNUSUAL SHORTNESS OF BREATH  *UNUSUAL BRUISING OR BLEEDING  TENDERNESS IN MOUTH AND THROAT WITH OR WITHOUT PRESENCE OF ULCERS  *URINARY PROBLEMS  *BOWEL PROBLEMS  UNUSUAL RASH Items with * indicate a potential emergency and should be followed up as soon as possible.  Return as scheduled.  I have been informed and understand all the instructions given to me. I know to contact the clinic, my physician, or go to the Emergency Department if any problems should occur. I do not have any questions at this time, but understand that I may call the clinic during office hours or the Patient Navigator at [phone] should I have any questions or need assistance in obtaining follow up care.    __________________________________________  _____________  __________ Signature of Patient or Authorized Representative            Date                   Time    __________________________________________ Nurse's Signature

## 2014-03-23 NOTE — Patient Instructions (Signed)
[place] at [facility]  Discharge Instructions:  You saw Dr Whitney Muse today.  We set up your PET scan fr April 17, 2014 Monday at 1000 am.  NPO 6hrs before exam and please arrive 30 minutes prior to exam which is at [facility] in [place].  Follow up in 2 weeks with the doctor at your next chemotherapy visit.  Please call the clinic if you have any questions or concerns before then _______________________________________________________________  Thank you for choosing [place] at [facility] to provide your oncology and hematology care.  To afford each patient quality time with our providers, please arrive at least 15 minutes before your scheduled appointment.  You need to re-schedule your appointment if you arrive 10 or more minutes late.  We strive to give you quality time with our providers, and arriving late affects you and other patients whose appointments are after yours.  Also, if you no show three or more times for appointments you may be dismissed from the clinic.  Again, thank you for choosing [place] at [facility] hope is that these requests will allow you access to exceptional care and in a timely manner. _______________________________________________________________  If you have questions after your visit, please contact our office at [phone] between the hours of 8:30 a.m. and 5:00 p.m. Voicemails left after 4:30 p.m. will not be returned until the following business day. _______________________________________________________________  For prescription refill requests, have your pharmacy contact our office. _______________________________________________________________  Recommendations made by the consultant and any test results will be sent to your referring physician. _______________________________________________________________

## 2014-03-23 NOTE — Progress Notes (Signed)
. [facility] CONSULT NOTE  Patient Care Team: Sharilyn Sites, MD as PCP - General (Family Medicine) Leota Sauers, RN as Registered Nurse (Oncology) Heath Lark, MD as Consulting Physician (Hematology and Oncology) Eppie Gibson, MD as Attending Physician (Radiation Oncology) Patrici Ranks, MD as Consulting Physician (Internal Medicine)  CHIEF COMPLAINTS/PURPOSE OF CONSULTATION:  Squamous Cell Carcinoma of the right tonsil, p16+, T3N2cM0, Stage IVA  Right tonsil and bilateral neck / 70 Gy in 35 fractions to gross disease, 63 Gy in 35 fractions to high risk nodal echelons, and 56 Gy in 35 fractions to intermediate risk nodal echelons 05/02/2013-06/22/2013   Oncology History   Tonsil cancer, HPV positive   Primary site: Pharynx - Oropharynx (Right)   Staging method: AJCC 7th Edition   Clinical free text: HPV positive   Clinical: Stage IVA (T3, N2c, M0) signed by Heath Lark, MD on 04/20/2013  9:29 PM   Summary: Stage IVA (T3, N2c, M0)       Tonsil cancer   02/01/2013 Imaging Ultrasound of the neck revealed bilateral lymphadenopathy in the submandibular region   03/16/2013 Imaging CT scan of the neck show large right tonsil mass measured 3.9 cm in maximum dimension as well as bilateral lymphadenopathy, the largest lymph node measures 31 mm. There is also a left thyroid mass measured 44 mm   03/21/2013 Procedure The patient was seen by ENT with laryngoscopy and biopsy. Pathology is pending   03/25/2013 Imaging PET scan showed large hypermetabolic soft tissue mass in the region of the right palatine tonsil with bilateral cervical hypermetabolic lymphadenopathy indicative of metastatic disease,   04/04/2013 Surgery He underwent placement of Port-A-Cath and feeding tube   04/14/2013 Procedure The patient underwent teeth extraction.   05/02/2013 - 06/13/2013 Chemotherapy The dosage of chemotherapy was interrupted many times due to side effects. His treatment is terminated early due to  severe ulcerated skin toxicity.   05/02/2013 - 06/22/2013 Radiation Therapy The patient completed radiation treatment.   10/18/2013 Imaging PET CT scan showed complete response in the oropharynx. However, he has evidence of new bilateral pulmonary metastasis.   12/16/2013 Imaging Repeat PET CT scan show significant progression of pulmonary metastases    01/25/2014 Pathology Results Squamous Cell Carcinoma, CT guided biopsy of RUL nodule   02/08/2014 -  Chemotherapy Cisplatin/Taxol weekly     HISTORY OF PRESENTING ILLNESS:  [name] 64 y.o. 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 He is doing remarkably well. His wife states she feels like she has her Maxine back. He has been outside doing yard work, Freight forwarder leaves, and pulling weeds. He has been driving going grocery shopping. He has no major complaints today. He states the third day after chemotherapy he does not feel like eating or drinking much but promptly improves over the next 24-48 hours. They have questions again regarding pet imaging.   MEDICAL HISTORY:  Past Medical History  Diagnosis Date  . Chest pain     10/14  . Urethral stricture     s/p dilitation  . Neuropathy     compression neuropathy right hip;s/p replacement  . Allergy   . Fibromyalgia   . Anxiety   . Depression   . Constipation   . Arthritis     knees, HIps, Hands  . Tonsillar cancer   . Type II diabetes mellitus   . Pneumonia 2012  . Complication of anesthesia     bleeding during intubation  04/04/13 due to friability of right tonsillar cancer  . PEG (percutaneous endoscopic gastrostomy) status   . S/P radiation therapy 05/02/2013-06/22/2013    70 [facility] - Squamous Cell Carcinoma of the tonsil, p16+, T3N2cM0    SURGICAL HISTORY: Past Surgical History  Procedure Laterality Date  . Total hip arthroplasty Right 2000    Dr. Percell Miller  . Cystoscopy      Dr. Karsten Ro  . Portacath placement Right 04/04/2013  . Gastrostomy tube placement   04/04/2013  . Portacath placement N/A 04/04/2013    Procedure: INSERTION PORT-A-CATH;  Surgeon: Ralene Ok, MD;  Location: [place];  Service: General;  Laterality: N/A;  . Laparoscopic gastrostomy N/A 04/04/2013    Procedure: LAPAROSCOPIC GASTROSTOMY TUBE PLACEMENT ;  Surgeon: Ralene Ok, MD;  Location: [place];  Service: General;  Laterality: N/A;  . Nasal hemorrhage control N/A 04/04/2013    Procedure: Control of oropharyngeal hemorrhage;  Surgeon: Ascencion Dike, MD;  Location[facility] OR;  Service: ENT;  Laterality: N/A;  . Multiple extractions with alveoloplasty N/A 04/14/2013    Procedure: Extraction of tooth #'s 1,2,3,4,5,6,7,8,9,10,11,12,13,14,15,17,18,19,20,21,22,23,24,25,26,27,28,29, 30, 31, and 32 with alveoloplasty and bilateral mandibular tori reductions.;  Surgeon: Lenn Cal, DDS;  Location: [place];  Service: Oral Surgery;  Laterality: N/A;    SOCIAL HISTORY: History   Social History  . Marital Status: Married    Spouse Name: N/A  . Number of Children: N/A  . Years of Education: N/A   Occupational History  . Not on file.   Social History Main Topics  . Smoking status: Never Smoker   . Smokeless tobacco: Never Used  . Alcohol Use: No     Comment: Occasional beer  . Drug Use: No  . Sexual Activity: Yes   Other Topics Concern  . Not on file   Social History Narrative  Married for 20 years. They have no children. They have a dog. He has a daughter from a prior relationship, they are estranged. No smoking, worked at [employer]. He has never smoked. Occasional alcohol.  FAMILY HISTORY: Family History  Problem Relation Age of Onset  . Hyperlipidemia Brother   . Cancer Mother     Deceased with leukemia, had uterine ca  . Cancer Cousin     living brain cancer, male   indicated that his mother is deceased. He indicated that his father is deceased. He indicated that his cousin is alive.   His mother had cervical cancer and leukemia. 1 brother who is  healthy.  ALLERGIES:  is allergic to neurontin and dilaudid.  MEDICATIONS:  Current Outpatient Prescriptions  Medication Sig Dispense Refill  . acetaminophen (TYLENOL) 500 MG tablet Take 500-1,000 mg by mouth every 4 (four) hours as needed for mild pain.     [name] alprazolam (XANAX) 2 MG tablet Take 2 mg by mouth 3 (three) times daily as needed for sleep or anxiety.     . Cholecalciferol (VITAMIN D3) 5000 UNITS CAPS Take 5,000 Units by mouth daily.    [name] CISPLATIN IV Inject into the vein. Weekly starting 02/07/14    . citalopram (CELEXA) 20 MG tablet Take 1 tablet (20 mg total) by mouth daily. 30 tablet 1  . diphenhydrAMINE (BENADRYL) 25 mg capsule Take 1 capsule (25 mg total) by mouth every 6 (six) hours as needed for itching. 60 capsule 0  . docusate sodium (COLACE) 100 MG capsule Take 100 mg by mouth 2 (two) times daily as needed for mild constipation.    . lidocaine-prilocaine (  EMLA) cream Apply a quarter size amount to port site 1 hour prior to chemo. Do not rub in. Cover with plastic wrap. 30 g 3  . ondansetron (ZOFRAN) 8 MG tablet Take 1 tablet (8 mg total) by mouth every 8 (eight) hours as needed for nausea. 60 tablet 3  . OxyCODONE (OXYCONTIN) 10 mg T12A 12 hr tablet Take 1 tablet (10 mg total) by mouth every 12 (twelve) hours. 60 tablet 0  . oxyCODONE-acetaminophen (PERCOCET) 10-325 MG per tablet Take 1 tablet by mouth every 4 (four) hours as needed for pain. 30 tablet 0  . PACLitaxel (TAXOL IV) Inject into the vein. Weekly starting 02/07/14    . polyethylene glycol (MIRALAX / GLYCOLAX) packet Take 17 g by mouth as needed.    . pregabalin (LYRICA) 150 MG capsule Take 2 capsules (300 mg total) by mouth 2 (two) times daily. (Patient taking differently: Take 300 mg by mouth 2 (two) times daily. 2 in am and 2 at night) 60 capsule 3  . promethazine (PHENERGAN) 25 MG tablet Take 1 tablet (25 mg total) by mouth every 6 (six) hours as needed for nausea. 60 tablet 3  . zolpidem (AMBIEN CR) 6.25 MG CR  tablet Take 6.25 mg by mouth at bedtime as needed for sleep.     No current facility-administered medications for this visit.   Facility-Administered Medications Ordered in Other Visits  Medication Dose Route Frequency Provider Last Rate Last Dose  . sodium chloride 0.9 % injection 10 mL  10 mL Intracatheter PRN Patrici Ranks, MD   10 mL at 03/23/14 0854    Review of Systems  Constitutional: Negative for fever, chills, weight loss and malaise/fatigue.  HENT: Negative for congestion, hearing loss, nosebleeds, sore throat and tinnitus.   Eyes: Negative for blurred vision, double vision, pain and discharge.  Respiratory: Negative for cough, hemoptysis, sputum production, shortness of breath and wheezing.   Cardiovascular: Negative for chest pain, palpitations, claudication, leg swelling and PND.  Gastrointestinal: Negative for heartburn, nausea, vomiting, abdominal pain, diarrhea, constipation, blood in stool and melena.  Genitourinary: Negative for dysuria, urgency, frequency and hematuria.  Musculoskeletal: Negative for myalgias, joint pain and falls.  Skin: Negative for itching and rash.  Neurological: Negative for dizziness, tingling, tremors, sensory change, speech change, focal weakness, seizures, loss of consciousness, weakness and headaches.  Endo/Heme/Allergies: Does not bruise/bleed easily.  Psychiatric/Behavioral: Negative for depression, suicidal ideas, memory loss and substance abuse. The patient is not nervous/anxious and does not have insomnia.     PHYSICAL EXAMINATION:  ECOG PERFORMANCE STATUS: 0 - Asymptomatic  Filed Vitals:   03/23/14 0800  BP: 116/77  Pulse: 71  Temp: 97.9 F (36.6 C)  Resp: 18   Filed Weights   03/23/14 0800  Weight: 202 lb 8 oz (91.853 kg)     Physical Exam  Constitutional: He is oriented to person, place, and time and well-developed, well-nourished, and in no distress.  HENT:  Head: Normocephalic and atraumatic.  Nose: Nose normal.   Mouth/Throat: Oropharynx is clear and moist. No oropharyngeal exudate.  XRT changes to the neck, mild edema noted  Eyes: Conjunctivae and EOM are normal. Pupils are equal, round, and reactive to light. Right eye exhibits no discharge. Left eye exhibits no discharge. No scleral icterus.  Neck: Normal range of motion. Neck supple. No tracheal deviation present. No thyromegaly present.  Cardiovascular: Normal rate, regular rhythm and normal heart sounds.  Exam reveals no gallop and no friction rub.   No murmur  heard. Pulmonary/Chest: Effort normal and breath sounds normal. He has no wheezes. He has no rales.  Abdominal: Soft. Bowel sounds are normal. He exhibits no distension and no mass. There is no tenderness. There is no rebound and no guarding.  Musculoskeletal: Normal range of motion. He exhibits no edema.  Lymphadenopathy:    He has no cervical adenopathy.  Neurological: He is alert and oriented to person, place, and time. He has normal reflexes. No cranial nerve deficit. Gait normal. Coordination normal.  Skin: Skin is warm and dry. No rash noted.  Psychiatric: Mood, memory, affect and judgment normal.  Nursing note and vitals reviewed.    LABORATORY DATA:  I have reviewed the data as listed Lab Results  Component Value Date   WBC 3.8* 03/16/2014   HGB 11.4* 03/16/2014   HCT 34.8* 03/16/2014   MCV 92.6 03/16/2014   PLT 112* 03/16/2014     Chemistry      Component Value Date/Time   NA 138 03/16/2014 0849   NA 142 12/16/2013 1305   K 4.0 03/16/2014 0849   K 4.7 12/16/2013 1305   CL 102 03/16/2014 0849   CO2 30 03/16/2014 0849   CO2 32* 12/16/2013 1305   BUN 16 03/16/2014 0849   BUN 16.6 12/16/2013 1305   CREATININE 0.64 03/16/2014 0849   CREATININE 0.8 12/16/2013 1305      Component Value Date/Time   CALCIUM 8.9 03/16/2014 0849   CALCIUM 9.9 12/16/2013 1305   ALKPHOS 69 03/16/2014 0849   ALKPHOS 84 12/16/2013 1305   AST 18 03/16/2014 0849   AST 20 12/16/2013 1305    ALT 11 03/16/2014 0849   ALT 13 12/16/2013 1305   BILITOT 0.5 03/16/2014 0849   BILITOT 0.45 12/16/2013 1305      ASSESSMENT & PLAN:  Tonsil cancer 64 year old male with stage IV squamous cell carcinoma. He is currently tolerating cisplatin and Taxol weekly given day 1, day 8, and day 15. We will repeat PET imaging in April. He will have completed 3 full cycles of treatment. His performance status is a 0-1. He is currently becoming more active, the weather certainly helping. His wife is also doing better. We will continue forward with treatment today, seeing him back again in 2 weeks prior to cycle 3.    Orders Placed This Encounter  Procedures  . NM PET Image Restag (PS) Skull Base To Thigh    Standing Status: Future     Number of Occurrences:      Standing Expiration Date: 03/23/2015    Order Specific Question:  Reason for Exam (SYMPTOM  OR DIAGNOSIS REQUIRED)    Answer:  restaging stage IV head and neck cancer    Order Specific Question:  Preferred imaging location?    Answer:  [facility]    All questions were answered. The patient knows to call the clinic with any problems, questions or concerns.   Molli Hazard, MD MD 03/23/2014 9:56 AM

## 2014-03-23 NOTE — Progress Notes (Signed)
Tolerated chemo well. 

## 2014-03-23 NOTE — Assessment & Plan Note (Signed)
64 year old male with stage IV squamous cell carcinoma. He is currently tolerating cisplatin and Taxol weekly given day 1, day 8, and day 15. We will repeat PET imaging in April. He will have completed 3 full cycles of treatment. His performance status is a 0-1. He is currently becoming more active, the weather certainly helping. His wife is also doing better. We will continue forward with treatment today, seeing him back again in 2 weeks prior to cycle 3.

## 2014-03-28 ENCOUNTER — Encounter (HOSPITAL_COMMUNITY): Payer: Self-pay | Admitting: Hematology & Oncology

## 2014-03-28 NOTE — Progress Notes (Signed)
[facility] CONSULT NOTE  Patient Care Team: Sharilyn Sites, MD as PCP - General (Family Medicine) Leota Sauers, RN as Registered Nurse (Oncology) Heath Lark, MD as Consulting Physician (Hematology and Oncology) Eppie Gibson, MD as Attending Physician (Radiation Oncology) Patrici Ranks, MD as Consulting Physician (Internal Medicine)  CHIEF COMPLAINTS/PURPOSE OF CONSULTATION:  Squamous Cell Carcinoma of the right tonsil, p16+, T3N2cM0, Stage IVA  Right tonsil and bilateral neck / 70 Gy in 35 fractions to gross disease, 63 Gy in 35 fractions to high risk nodal echelons, and 56 Gy in 35 fractions to intermediate risk nodal echelons 05/02/2013-06/22/2013   Oncology History   Tonsil cancer, HPV positive   Primary site: Pharynx - Oropharynx (Right)   Staging method: AJCC 7th Edition   Clinical free text: HPV positive   Clinical: Stage IVA (T3, N2c, M0) signed by Heath Lark, MD on 04/20/2013  9:29 PM   Summary: Stage IVA (T3, N2c, M0)       Tonsil cancer   02/01/2013 Imaging Ultrasound of the neck revealed bilateral lymphadenopathy in the submandibular region   03/16/2013 Imaging CT scan of the neck show large right tonsil mass measured 3.9 cm in maximum dimension as well as bilateral lymphadenopathy, the largest lymph node measures 31 mm. There is also a left thyroid mass measured 44 mm   03/21/2013 Procedure The patient was seen by ENT with laryngoscopy and biopsy. Pathology is pending   03/25/2013 Imaging PET scan showed large hypermetabolic soft tissue mass in the region of the right palatine tonsil with bilateral cervical hypermetabolic lymphadenopathy indicative of metastatic disease,   04/04/2013 Surgery He underwent placement of Port-A-Cath and feeding tube   04/14/2013 Procedure The patient underwent teeth extraction.   05/02/2013 - 06/13/2013 Chemotherapy The dosage of chemotherapy was interrupted many times due to side effects. His treatment is terminated early due to severe  ulcerated skin toxicity.   05/02/2013 - 06/22/2013 Radiation Therapy The patient completed radiation treatment.   10/18/2013 Imaging PET CT scan showed complete response in the oropharynx. However, he has evidence of new bilateral pulmonary metastasis.   12/16/2013 Imaging Repeat PET CT scan show significant progression of pulmonary metastases    01/25/2014 Pathology Results Squamous Cell Carcinoma, CT guided biopsy of RUL nodule   02/08/2014 -  Chemotherapy Cisplatin/Taxol weekly     HISTORY OF PRESENTING ILLNESS:  [name] 64 y.o. 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 He feels he is doing very well. He states he is up daily. He has been washing dishes, throbbing close, walking his dogs. Good days with his appetite and bad days with his appetite. His major issue is the taste of food. He being treated with cisplatin and Taxol given on days 1, 8, and 15. He wishes to continue with therapy. He has ongoing questions about reimaging studies.   MEDICAL HISTORY:  Past Medical History  Diagnosis Date  . Chest pain     10/14  . Urethral stricture     s/p dilitation  . Neuropathy     compression neuropathy right hip;s/p replacement  . Allergy   . Fibromyalgia   . Anxiety   . Depression   . Constipation   . Arthritis     knees, HIps, Hands  . Tonsillar cancer   . Type II diabetes mellitus   . Pneumonia 2012  . Complication of anesthesia     bleeding during intubation 04/04/13 due to friability  of right tonsillar cancer  . PEG (percutaneous endoscopic gastrostomy) status   . S/P radiation therapy 05/02/2013-06/22/2013    70 [facility] - Squamous Cell Carcinoma of the tonsil, p16+, T3N2cM0    SURGICAL HISTORY: Past Surgical History  Procedure Laterality Date  . Total hip arthroplasty Right 2000    Dr. Percell Miller  . Cystoscopy      Dr. Karsten Ro  . Portacath placement Right 04/04/2013  . Gastrostomy tube placement  04/04/2013  . Portacath placement N/A 04/04/2013     Procedure: INSERTION PORT-A-CATH;  Surgeon: Ralene Ok, MD;  Location: [facility];  Service: General;  Laterality: N/A;  . Laparoscopic gastrostomy N/A 04/04/2013    Procedure: LAPAROSCOPIC GASTROSTOMY TUBE PLACEMENT ;  Surgeon: Ralene Ok, MD;  Location: [facility];  Service: General;  Laterality: N/A;  . Nasal hemorrhage control N/A 04/04/2013    Procedure: Control of oropharyngeal hemorrhage;  Surgeon: Ascencion Dike, MD;  Location: [facility];  Service: ENT;  Laterality: N/A;  . Multiple extractions with alveoloplasty N/A 04/14/2013    Procedure: Extraction of tooth #'s 1,2,3,4,5,6,7,8,9,10,11,12,13,14,15,17,18,19,20,21,22,23,24,25,26,27,28,29, 30, 31, and 32 with alveoloplasty and bilateral mandibular tori reductions.;  Surgeon: Lenn Cal, DDS;  Location: [facility];  Service: Oral Surgery;  Laterality: N/A;    SOCIAL HISTORY: History   Social History  . Marital Status: Married    Spouse Name: N/A  . Number of Children: N/A  . Years of Education: N/A   Occupational History  . Not on file.   Social History Main Topics  . Smoking status: Never Smoker   . Smokeless tobacco: Never Used  . Alcohol Use: No     Comment: Occasional beer  . Drug Use: No  . Sexual Activity: Yes   Other Topics Concern  . Not on file   Social History Narrative  Married for 20 years. They have no children. They have a dog. He has a daughter from a prior relationship, they are estranged. No smoking, worked at [employer]. He has never smoked. Occasional alcohol.  FAMILY HISTORY: Family History  Problem Relation Age of Onset  . Hyperlipidemia Brother   . Cancer Mother     Deceased with leukemia, had uterine ca  . Cancer Cousin     living brain cancer, male   indicated that his mother is deceased. He indicated that his father is deceased. He indicated that his cousin is alive.   His mother had cervical cancer and leukemia. 1 brother who is healthy.  ALLERGIES:  is allergic to neurontin and  dilaudid.  MEDICATIONS:  Current Outpatient Prescriptions  Medication Sig Dispense Refill  . acetaminophen (TYLENOL) 500 MG tablet Take 500-1,000 mg by mouth every 4 (four) hours as needed for mild pain.     [name] alprazolam (XANAX) 2 MG tablet Take 2 mg by mouth 3 (three) times daily as needed for sleep or anxiety.     . Cholecalciferol (VITAMIN D3) 5000 UNITS CAPS Take 5,000 Units by mouth daily.    [name] CISPLATIN IV Inject into the vein. Weekly starting 02/07/14    . citalopram (CELEXA) 20 MG tablet Take 1 tablet (20 mg total) by mouth daily. 30 tablet 1  . diphenhydrAMINE (BENADRYL) 25 mg capsule Take 1 capsule (25 mg total) by mouth every 6 (six) hours as needed for itching. 60 capsule 0  . docusate sodium (COLACE) 100 MG capsule Take 100 mg by mouth 2 (two) times daily as needed for mild constipation.    . lidocaine-prilocaine (EMLA) cream Apply a  quarter size amount to port site 1 hour prior to chemo. Do not rub in. Cover with plastic wrap. 30 g 3  . ondansetron (ZOFRAN) 8 MG tablet Take 1 tablet (8 mg total) by mouth every 8 (eight) hours as needed for nausea. 60 tablet 3  . OxyCODONE (OXYCONTIN) 10 mg T12A 12 hr tablet Take 1 tablet (10 mg total) by mouth every 12 (twelve) hours. 60 tablet 0  . oxyCODONE-acetaminophen (PERCOCET) 10-325 MG per tablet Take 1 tablet by mouth every 4 (four) hours as needed for pain. 30 tablet 0  . PACLitaxel (TAXOL IV) Inject into the vein. Weekly starting 02/07/14    . polyethylene glycol (MIRALAX / GLYCOLAX) packet Take 17 g by mouth as needed.    . pregabalin (LYRICA) 150 MG capsule Take 2 capsules (300 mg total) by mouth 2 (two) times daily. (Patient taking differently: Take 300 mg by mouth 2 (two) times daily. 2 in am and 2 at night) 60 capsule 3  . promethazine (PHENERGAN) 25 MG tablet Take 1 tablet (25 mg total) by mouth every 6 (six) hours as needed for nausea. 60 tablet 3  . zolpidem (AMBIEN CR) 6.25 MG CR tablet Take 6.25 mg by mouth at bedtime as needed  for sleep.     No current facility-administered medications for this visit.    Review of Systems  Constitutional: Negative for fever, chills, weight loss and malaise/fatigue.  HENT: Negative for congestion, hearing loss, nosebleeds, sore throat and tinnitus.   Eyes: Negative for blurred vision, double vision, pain and discharge.  Respiratory: Negative for cough, hemoptysis, sputum production, shortness of breath and wheezing.   Cardiovascular: Negative for chest pain, palpitations, claudication, leg swelling and PND.  Gastrointestinal: Negative for heartburn, nausea, vomiting, abdominal pain, diarrhea, constipation, blood in stool and melena.  Genitourinary: Negative for dysuria, urgency, frequency and hematuria.  Musculoskeletal: Negative for myalgias, joint pain and falls.  Skin: Positive for rash. Negative for itching.       Mild rash on scalp  Neurological: Negative for dizziness, tingling, tremors, sensory change, speech change, focal weakness, seizures, loss of consciousness, weakness and headaches.  Endo/Heme/Allergies: Does not bruise/bleed easily.  Psychiatric/Behavioral: Negative for depression, suicidal ideas, memory loss and substance abuse. The patient is not nervous/anxious and does not have insomnia.     PHYSICAL EXAMINATION:  ECOG PERFORMANCE STATUS: 0 - Asymptomatic  Filed Vitals:   03/09/14 0835  BP: 130/69  Pulse: 62  Temp: 97.7 F (36.5 C)  Resp: 18   Filed Weights   03/09/14 0835  Weight: 205 lb (92.987 kg)     Physical Exam  Constitutional: He is oriented to person, place, and time and well-developed, well-nourished, and in no distress.  HENT:  Head: Normocephalic and atraumatic.  Nose: Nose normal.  Mouth/Throat: Oropharynx is clear and moist. No oropharyngeal exudate.  XRT changes to the neck, mild edema noted  Eyes: Conjunctivae and EOM are normal. Pupils are equal, round, and reactive to light. Right eye exhibits no discharge. Left eye exhibits  no discharge. No scleral icterus.  Neck: Normal range of motion. Neck supple. No tracheal deviation present. No thyromegaly present.  Cardiovascular: Normal rate, regular rhythm and normal heart sounds.  Exam reveals no gallop and no friction rub.   No murmur heard. Pulmonary/Chest: Effort normal and breath sounds normal. He has no wheezes. He has no rales.  Abdominal: Soft. Bowel sounds are normal. He exhibits no distension and no mass. There is no tenderness. There is no rebound  and no guarding.  Musculoskeletal: Normal range of motion. He exhibits no edema.  Lymphadenopathy:    He has no cervical adenopathy.  Neurological: He is alert and oriented to person, place, and time. He has normal reflexes. No cranial nerve deficit. Gait normal. Coordination normal.  Skin: Skin is warm and dry. No rash noted.  Psychiatric: Mood, memory, affect and judgment normal.  Nursing note and vitals reviewed.    LABORATORY DATA:  I have reviewed the data as listed Lab Results  Component Value Date   WBC 2.2* 03/23/2014   HGB 11.3* 03/23/2014   HCT 34.0* 03/23/2014   MCV 91.9 03/23/2014   PLT 118* 03/23/2014     Chemistry      Component Value Date/Time   NA 139 03/23/2014 0925   NA 142 12/16/2013 1305   K 3.8 03/23/2014 0925   K 4.7 12/16/2013 1305   CL 105 03/23/2014 0925   CO2 28 03/23/2014 0925   CO2 32* 12/16/2013 1305   BUN 15 03/23/2014 0925   BUN 16.6 12/16/2013 1305   CREATININE 0.63 03/23/2014 0925   CREATININE 0.8 12/16/2013 1305      Component Value Date/Time   CALCIUM 9.0 03/23/2014 0925   CALCIUM 9.9 12/16/2013 1305   ALKPHOS 68 03/23/2014 0925   ALKPHOS 84 12/16/2013 1305   AST 19 03/23/2014 0925   AST 20 12/16/2013 1305   ALT 12 03/23/2014 0925   ALT 13 12/16/2013 1305   BILITOT 0.7 03/23/2014 0925   BILITOT 0.45 12/16/2013 1305      ASSESSMENT & PLAN:  Tonsil cancer Pleasant 64 year old male with stage IV squamous cell carcinoma of the tonsil. He is currently on  cisplatin and Taxol with good tolerance. His performance status is good. He has had no difficulties with nausea, vomiting, and has no evidence worsening of his baseline neuropathy. We will continue forward with therapy. He will return in 2 weeks prior to his next cycle. We will discuss and order repeat imaging studies at his next visit.    No orders of the defined types were placed in this encounter.   All questions were answered. The patient knows to call the clinic with any problems, questions or concerns. This note was signed electronically  Molli Hazard, MD MD 03/28/2014 3:01 PM

## 2014-03-28 NOTE — Assessment & Plan Note (Signed)
Pleasant 64 year old male with stage IV squamous cell carcinoma of the tonsil. He is currently on cisplatin and Taxol with good tolerance. His performance status is good. He has had no difficulties with nausea, vomiting, and has no evidence worsening of his baseline neuropathy. We will continue forward with therapy. He will return in 2 weeks prior to his next cycle. We will discuss and order repeat imaging studies at his next visit.

## 2014-03-30 ENCOUNTER — Inpatient Hospital Stay (HOSPITAL_COMMUNITY): Payer: Self-pay

## 2014-04-06 ENCOUNTER — Inpatient Hospital Stay (HOSPITAL_COMMUNITY): Payer: Self-pay

## 2014-04-06 ENCOUNTER — Encounter (HOSPITAL_BASED_OUTPATIENT_CLINIC_OR_DEPARTMENT_OTHER): Payer: 59 | Admitting: Hematology & Oncology

## 2014-04-06 ENCOUNTER — Encounter (HOSPITAL_BASED_OUTPATIENT_CLINIC_OR_DEPARTMENT_OTHER): Payer: 59

## 2014-04-06 ENCOUNTER — Ambulatory Visit (HOSPITAL_COMMUNITY): Payer: Self-pay | Admitting: Hematology & Oncology

## 2014-04-06 ENCOUNTER — Encounter (HOSPITAL_COMMUNITY): Payer: Self-pay | Admitting: Hematology & Oncology

## 2014-04-06 VITALS — BP 131/72 | HR 66 | Temp 98.6°F | Resp 16 | Wt 202.4 lb

## 2014-04-06 DIAGNOSIS — C7802 Secondary malignant neoplasm of left lung: Secondary | ICD-10-CM

## 2014-04-06 DIAGNOSIS — C7801 Secondary malignant neoplasm of right lung: Secondary | ICD-10-CM | POA: Diagnosis not present

## 2014-04-06 DIAGNOSIS — C760 Malignant neoplasm of head, face and neck: Secondary | ICD-10-CM

## 2014-04-06 DIAGNOSIS — C099 Malignant neoplasm of tonsil, unspecified: Secondary | ICD-10-CM

## 2014-04-06 DIAGNOSIS — C7800 Secondary malignant neoplasm of unspecified lung: Secondary | ICD-10-CM

## 2014-04-06 LAB — CBC WITH DIFFERENTIAL/PLATELET
Basophils Absolute: 0 K/uL (ref 0.0–0.1)
Basophils Relative: 2 % — ABNORMAL HIGH (ref 0–1)
Eosinophils Absolute: 0 K/uL (ref 0.0–0.7)
Eosinophils Relative: 1 % (ref 0–5)
HCT: 33.5 % — ABNORMAL LOW (ref 39.0–52.0)
Hemoglobin: 11.1 g/dL — ABNORMAL LOW (ref 13.0–17.0)
Lymphocytes Relative: 20 % (ref 12–46)
Lymphs Abs: 0.4 K/uL — ABNORMAL LOW (ref 0.7–4.0)
MCH: 30.9 pg (ref 26.0–34.0)
MCHC: 33.1 g/dL (ref 30.0–36.0)
MCV: 93.3 fL (ref 78.0–100.0)
Monocytes Absolute: 0.5 K/uL (ref 0.1–1.0)
Monocytes Relative: 23 % — ABNORMAL HIGH (ref 3–12)
Neutro Abs: 1.2 K/uL — ABNORMAL LOW (ref 1.7–7.7)
Neutrophils Relative %: 54 % (ref 43–77)
Platelets: 111 K/uL — ABNORMAL LOW (ref 150–400)
RBC: 3.59 MIL/uL — ABNORMAL LOW (ref 4.22–5.81)
RDW: 17.4 % — ABNORMAL HIGH (ref 11.5–15.5)
WBC: 2.1 K/uL — ABNORMAL LOW (ref 4.0–10.5)

## 2014-04-06 LAB — COMPREHENSIVE METABOLIC PANEL
ALK PHOS: 70 U/L (ref 39–117)
ALT: 13 U/L (ref 0–53)
AST: 21 U/L (ref 0–37)
Albumin: 3.8 g/dL (ref 3.5–5.2)
Anion gap: 5 (ref 5–15)
BUN: 14 mg/dL (ref 6–23)
CO2: 30 mmol/L (ref 19–32)
Calcium: 8.9 mg/dL (ref 8.4–10.5)
Chloride: 103 mmol/L (ref 96–112)
Creatinine, Ser: 0.67 mg/dL (ref 0.50–1.35)
GFR calc non Af Amer: >90 mL/min (ref >90)
GLUCOSE: 93 mg/dL (ref 70–99)
POTASSIUM: 3.9 mmol/L (ref 3.5–5.1)
SODIUM: 138 mmol/L (ref 135–145)
TOTAL PROTEIN: 7 g/dL (ref 6.0–8.3)
Total Bilirubin: 0.7 mg/dL (ref 0.3–1.2)

## 2014-04-06 MED ORDER — POTASSIUM CHLORIDE 2 MEQ/ML IV SOLN
Freq: Once | INTRAVENOUS | Status: AC
  Administered 2014-04-06: 09:00:00 via INTRAVENOUS
  Filled 2014-04-06: qty 10

## 2014-04-06 MED ORDER — HEPARIN SOD (PORK) LOCK FLUSH 100 UNIT/ML IV SOLN
INTRAVENOUS | Status: AC
  Filled 2014-04-06: qty 5

## 2014-04-06 MED ORDER — SODIUM CHLORIDE 0.9 % IV SOLN
Freq: Once | INTRAVENOUS | Status: AC
  Administered 2014-04-06: 09:00:00 via INTRAVENOUS

## 2014-04-06 MED ORDER — SODIUM CHLORIDE 0.9 % IJ SOLN: 10.0000 mL | INTRAMUSCULAR | Status: DC | PRN

## 2014-04-06 MED ORDER — HEPARIN SOD (PORK) LOCK FLUSH 100 UNIT/ML IV SOLN
500.0000 [IU] | Freq: Once | INTRAVENOUS | Status: AC | PRN
  Administered 2014-04-06: 500 [IU]

## 2014-04-06 NOTE — Patient Instructions (Signed)
[facility] Discharge Instructions for Patients Receiving Chemotherapy  Today you received the following chemotherapy agents  Held chemotherapy today.  Return next week for treatment.  Scans on 04/17/14.    To help prevent nausea and vomiting after your treatment, we encourage you to take your nausea medication  If you develop nausea and vomiting that is not controlled by your nausea medication, call the clinic. If it is after clinic hours your family physician or the after hours number for the clinic or go to the Emergency Department.   BELOW ARE SYMPTOMS THAT SHOULD BE REPORTED IMMEDIATELY:  *FEVER GREATER THAN 101.0 F  *CHILLS WITH OR WITHOUT FEVER  NAUSEA AND VOMITING THAT IS NOT CONTROLLED WITH YOUR NAUSEA MEDICATION  *UNUSUAL SHORTNESS OF BREATH  *UNUSUAL BRUISING OR BLEEDING  TENDERNESS IN MOUTH AND THROAT WITH OR WITHOUT PRESENCE OF ULCERS  *URINARY PROBLEMS  *BOWEL PROBLEMS  UNUSUAL RASH Items with * indicate a potential emergency and should be followed up as soon as possible.  One of the nurses will contact you 24 hours after your treatment. Please let the nurse know about any problems that you may have experienced. Feel free to call the clinic you have any questions or concerns. The clinic phone number is [phone].   I have been informed and understand all the instructions given to me. I know to contact the clinic, my physician, or go to the Emergency Department if any problems should occur. I do not have any questions at this time, but understand that I may call the clinic during office hours or the Patient Navigator at [phone] should I have any questions or need assistance in obtaining follow up care.

## 2014-04-06 NOTE — Patient Instructions (Signed)
[place] at [facility] Discharge Instructions  RECOMMENDATIONS MADE BY THE CONSULTANT AND ANY TEST RESULTS WILL BE SENT TO YOUR REFERRING PHYSICIAN.  Exam and discussion by Dr. Whitney Muse Will hold chemotherapy today.  Your white blood count is too low.  Avoid crowds, people who are sick, use good hand washing, etc. Chemotherapy next week PET scan as scheduled Follow-up after PET scan.   Thank you for choosing [place] at [facility] to provide your oncology and hematology care.  To afford each patient quality time with our provider, please arrive at least 15 minutes before your scheduled appointment time.    You need to re-schedule your appointment should you arrive 10 or more minutes late.  We strive to give you quality time with our providers, and arriving late affects you and other patients whose appointments are after yours.  Also, if you no show three or more times for appointments you may be dismissed from the clinic at the providers discretion.     Again, thank you for choosing [facility].  Our hope is that these requests will decrease the amount of time that you wait before being seen by our physicians.       _____________________________________________________________  Should you have questions after your visit to [facility], please contact our office at [phone] between the hours of 8:30 a.m. and 4:30 p.m.  Voicemails left after 4:30 p.m. will not be returned until the following business day.  For prescription refill requests, have your pharmacy contact our office.

## 2014-04-06 NOTE — Progress Notes (Signed)
Joyce Copa [place] Hold chemotherapy today due to [facility].  Will dose reduce next chemotherapy.  Return next week for treatment.  Discharged ambulatory

## 2014-04-06 NOTE — Progress Notes (Signed)
. [facility] Progress Note  Patient Care Team: Sharilyn Sites, MD as PCP - General (Family Medicine) Leota Sauers, RN as Registered Nurse (Oncology) Heath Lark, MD as Consulting Physician (Hematology and Oncology) Eppie Gibson, MD as Attending Physician (Radiation Oncology) Patrici Ranks, MD as Consulting Physician (Internal Medicine)  Diagnosis: Squamous Cell Carcinoma of the right tonsil, p16+, T3N2cM0, Stage IVA  Right tonsil and bilateral neck / 70 Gy in 35 fractions to gross disease, 63 Gy in 35 fractions to high risk nodal echelons, and 56 Gy in 35 fractions to intermediate risk nodal echelons 05/02/2013-06/22/2013   Oncology History   Tonsil cancer, HPV positive   Primary site: Pharynx - Oropharynx (Right)   Staging method: AJCC 7th Edition   Clinical free text: HPV positive   Clinical: Stage IVA (T3, N2c, M0) signed by Heath Lark, MD on 04/20/2013  9:29 PM   Summary: Stage IVA (T3, N2c, M0)       Tonsil cancer   02/01/2013 Imaging Ultrasound of the neck revealed bilateral lymphadenopathy in the submandibular region   03/16/2013 Imaging CT scan of the neck show large right tonsil mass measured 3.9 cm in maximum dimension as well as bilateral lymphadenopathy, the largest lymph node measures 31 mm. There is also a left thyroid mass measured 44 mm   03/21/2013 Procedure The patient was seen by ENT with laryngoscopy and biopsy. Pathology is pending   03/25/2013 Imaging PET scan showed large hypermetabolic soft tissue mass in the region of the right palatine tonsil with bilateral cervical hypermetabolic lymphadenopathy indicative of metastatic disease,   04/04/2013 Surgery He underwent placement of Port-A-Cath and feeding tube   04/14/2013 Procedure The patient underwent teeth extraction.   05/02/2013 - 06/13/2013 Chemotherapy The dosage of chemotherapy was interrupted many times due to side effects. His treatment is terminated early due to severe ulcerated skin toxicity.   05/02/2013 - 06/22/2013 Radiation Therapy The patient completed radiation treatment.   10/18/2013 Imaging PET CT scan showed complete response in the oropharynx. However, he has evidence of new bilateral pulmonary metastasis.   12/16/2013 Imaging Repeat PET CT scan show significant progression of pulmonary metastases    01/25/2014 Pathology Results Squamous Cell Carcinoma, CT guided biopsy of RUL nodule   02/08/2014 -  Chemotherapy Cisplatin/Taxol weekly     HISTORY OF PRESENTING ILLNESS:  [name] 64 y.o. 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 He continues to do very well. He states his neuropathy in his feet is no worse than baseline. He went to [place] last week and bought an axe. He has been working outside daily. He states he worked over 2 hours yesterday cutting wood and clearing his property. He describes his appetite as good. He has had some difficulties with his bottom dentures and is going to see his dentist to see if they can be refitted. He denies any shortness of breath. He states the day after chemotherapy he tends to be up all night and knows this is from the steroids.  MEDICAL HISTORY:  Past Medical History  Diagnosis Date  . Chest pain     10/14  . Urethral stricture     s/p dilitation  . Neuropathy     compression neuropathy right hip;s/p replacement  . Allergy   . Fibromyalgia   . Anxiety   . Depression   . Constipation   . Arthritis     knees, HIps, Hands  . Tonsillar  cancer   . Type II diabetes mellitus   . Pneumonia 2012  . Complication of anesthesia     bleeding during intubation 04/04/13 due to friability of right tonsillar cancer  . PEG (percutaneous endoscopic gastrostomy) status   . S/P radiation therapy 05/02/2013-06/22/2013    70 [facility] - Squamous Cell Carcinoma of the tonsil, p16+, T3N2cM0    SURGICAL HISTORY: Past Surgical History  Procedure Laterality Date  . Total hip arthroplasty Right 2000    Dr. Percell Miller  .  Cystoscopy      Dr. Karsten Ro  . Portacath placement Right 04/04/2013  . Gastrostomy tube placement  04/04/2013  . Portacath placement N/A 04/04/2013    Procedure: INSERTION PORT-A-CATH;  Surgeon: Ralene Ok, MD;  Location: [facility];  Service: General;  Laterality: N/A;  . Laparoscopic gastrostomy N/A 04/04/2013    Procedure: LAPAROSCOPIC GASTROSTOMY TUBE PLACEMENT ;  Surgeon: Ralene Ok, MD;  Location: [facility];  Service: General;  Laterality: N/A;  . Nasal hemorrhage control N/A 04/04/2013    Procedure: Control of oropharyngeal hemorrhage;  Surgeon: Ascencion Dike, MD;  Location: [facility];  Service: ENT;  Laterality: N/A;  . Multiple extractions with alveoloplasty N/A 04/14/2013    Procedure: Extraction of tooth #'s 1,2,3,4,5,6,7,8,9,10,11,12,13,14,15,17,18,19,20,21,22,23,24,25,26,27,28,29, 30, 31, and 32 with alveoloplasty and bilateral mandibular tori reductions.;  Surgeon: Lenn Cal, DDS;  Location: [facility];  Service: Oral Surgery;  Laterality: N/A;    SOCIAL HISTORY: History   Social History  . Marital Status: Married    Spouse Name: N/A  . Number of Children: N/A  . Years of Education: N/A   Occupational History  . Not on file.   Social History Main Topics  . Smoking status: Never Smoker   . Smokeless tobacco: Never Used  . Alcohol Use: No     Comment: Occasional beer  . Drug Use: No  . Sexual Activity: Yes   Other Topics Concern  . Not on file   Social History Narrative  Married for 20 years. They have no children. They have a dog. He has a daughter from a prior relationship, they are estranged. No smoking, worked at [employer]. He has never smoked. Occasional alcohol.  FAMILY HISTORY: Family History  Problem Relation Age of Onset  . Hyperlipidemia Brother   . Cancer Mother     Deceased with leukemia, had uterine ca  . Cancer Cousin     living brain cancer, male   indicated that his mother is deceased. He indicated that his father is deceased. He indicated  that his cousin is alive.   His mother had cervical cancer and leukemia. 1 brother who is healthy.  ALLERGIES:  is allergic to neurontin and dilaudid.  MEDICATIONS:  Current Outpatient Prescriptions  Medication Sig Dispense Refill  . acetaminophen (TYLENOL) 500 MG tablet Take 500-1,000 mg by mouth every 4 (four) hours as needed for mild pain.     [name] alprazolam (XANAX) 2 MG tablet Take 2 mg by mouth 3 (three) times daily as needed for sleep or anxiety.     . Cholecalciferol (VITAMIN D3) 5000 UNITS CAPS Take 5,000 Units by mouth daily.    [name] CISPLATIN IV Inject into the vein. Weekly starting 02/07/14    . citalopram (CELEXA) 20 MG tablet Take 1 tablet (20 mg total) by mouth daily. 30 tablet 1  . diphenhydrAMINE (BENADRYL) 25 mg capsule Take 1 capsule (25 mg total) by mouth every 6 (six) hours as needed for itching. 60 capsule 0  .  docusate sodium (COLACE) 100 MG capsule Take 100 mg by mouth 2 (two) times daily as needed for mild constipation.    . lidocaine-prilocaine (EMLA) cream Apply a quarter size amount to port site 1 hour prior to chemo. Do not rub in. Cover with plastic wrap. 30 g 3  . ondansetron (ZOFRAN) 8 MG tablet Take 1 tablet (8 mg total) by mouth every 8 (eight) hours as needed for nausea. 60 tablet 3  . OxyCODONE (OXYCONTIN) 10 mg T12A 12 hr tablet Take 1 tablet (10 mg total) by mouth every 12 (twelve) hours. 60 tablet 0  . oxyCODONE-acetaminophen (PERCOCET) 10-325 MG per tablet Take 1 tablet by mouth every 4 (four) hours as needed for pain. 30 tablet 0  . PACLitaxel (TAXOL IV) Inject into the vein. Weekly starting 02/07/14    . polyethylene glycol (MIRALAX / GLYCOLAX) packet Take 17 g by mouth as needed.    . pregabalin (LYRICA) 150 MG capsule Take 2 capsules (300 mg total) by mouth 2 (two) times daily. (Patient taking differently: Take 300 mg by mouth 2 (two) times daily. 2 in am and 2 at night) 60 capsule 3  . promethazine (PHENERGAN) 25 MG tablet Take 1 tablet (25 mg total) by mouth  every 6 (six) hours as needed for nausea. 60 tablet 3  . zolpidem (AMBIEN CR) 6.25 MG CR tablet Take 6.25 mg by mouth at bedtime as needed for sleep.     No current facility-administered medications for this visit.    Review of Systems  Constitutional: Negative for fever, chills, weight loss and malaise/fatigue.  HENT: Negative for congestion, hearing loss, nosebleeds, sore throat and tinnitus.   Eyes: Negative for blurred vision, double vision, pain and discharge.  Respiratory: Negative for cough, hemoptysis, sputum production, shortness of breath and wheezing.   Cardiovascular: Negative for chest pain, palpitations, claudication, leg swelling and PND.  Gastrointestinal: Negative for heartburn, nausea, vomiting, abdominal pain, diarrhea, constipation, blood in stool and melena.  Genitourinary: Negative for dysuria, urgency, frequency and hematuria.  Musculoskeletal: Negative for myalgias, joint pain and falls.  Skin: Negative for itching and rash.  Neurological: Negative for dizziness, tingling, tremors, sensory change, speech change, focal weakness, seizures, loss of consciousness, weakness and headaches.  Endo/Heme/Allergies: Does not bruise/bleed easily.  Psychiatric/Behavioral: Negative for depression, suicidal ideas, memory loss and substance abuse. The patient is not nervous/anxious and does not have insomnia.     PHYSICAL EXAMINATION:  ECOG PERFORMANCE STATUS: 0 - Asymptomatic  There were no vitals filed for this visit. There were no vitals filed for this visit.   Physical Exam  Constitutional: He is oriented to person, place, and time and well-developed, well-nourished, and in no distress.  HENT:  Head: Normocephalic and atraumatic.  Nose: Nose normal.  Mouth/Throat: Oropharynx is clear and moist. No oropharyngeal exudate.  XRT changes to the neck, mild edema noted  Eyes: Conjunctivae and EOM are normal. Pupils are equal, round, and reactive to light. Right eye exhibits  no discharge. Left eye exhibits no discharge. No scleral icterus.  Neck: Normal range of motion. Neck supple. No tracheal deviation present. No thyromegaly present.  Cardiovascular: Normal rate, regular rhythm and normal heart sounds.  Exam reveals no gallop and no friction rub.   No murmur heard. Pulmonary/Chest: Effort normal and breath sounds normal. He has no wheezes. He has no rales.  Abdominal: Soft. Bowel sounds are normal. He exhibits no distension and no mass. There is no tenderness. There is no rebound and no guarding.  Musculoskeletal: Normal range of motion. He exhibits no edema.  Lymphadenopathy:    He has no cervical adenopathy.  Neurological: He is alert and oriented to person, place, and time. He has normal reflexes. No cranial nerve deficit. Gait normal. Coordination normal.  Skin: Skin is warm and dry. No rash noted.  Psychiatric: Mood, memory, affect and judgment normal.  Nursing note and vitals reviewed.    LABORATORY DATA:  I have reviewed the data as listed Lab Results  Component Value Date   WBC 2.2* 03/23/2014   HGB 11.3* 03/23/2014   HCT 34.0* 03/23/2014   MCV 91.9 03/23/2014   PLT 118* 03/23/2014     Chemistry      Component Value Date/Time   NA 139 03/23/2014 0925   NA 142 12/16/2013 1305   K 3.8 03/23/2014 0925   K 4.7 12/16/2013 1305   CL 105 03/23/2014 0925   CO2 28 03/23/2014 0925   CO2 32* 12/16/2013 1305   BUN 15 03/23/2014 0925   BUN 16.6 12/16/2013 1305   CREATININE 0.63 03/23/2014 0925   CREATININE 0.8 12/16/2013 1305      Component Value Date/Time   CALCIUM 9.0 03/23/2014 0925   CALCIUM 9.9 12/16/2013 1305   ALKPHOS 68 03/23/2014 0925   ALKPHOS 84 12/16/2013 1305   AST 19 03/23/2014 0925   AST 20 12/16/2013 1305   ALT 12 03/23/2014 0925   ALT 13 12/16/2013 1305   BILITOT 0.7 03/23/2014 0925   BILITOT 0.45 12/16/2013 1305      ASSESSMENT & PLAN:  Stage IV squamous cell carcinoma of the head and neck  64 year old male with  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 He is on Cisplatin and Taxol given weekly. He has done very well with therapy. His performance status is a 0-1. He is set up for repeat pet imaging on April 4. His ANC today is 1200 and since therapy is palliative I have recommended delaying treatment this week. He will receive treatment next week pending his counts. I will see him back after his PET scan to review the results and discuss additional treatment moving forward.   All questions were answered. The patient knows to call the clinic with any problems, questions or concerns.   Molli Hazard, MD MD 04/06/2014 7:35 AM

## 2014-04-13 ENCOUNTER — Inpatient Hospital Stay (HOSPITAL_COMMUNITY): Payer: Self-pay

## 2014-04-13 ENCOUNTER — Encounter (HOSPITAL_COMMUNITY): Payer: Self-pay

## 2014-04-13 ENCOUNTER — Other Ambulatory Visit (HOSPITAL_COMMUNITY): Payer: Self-pay | Admitting: Oncology

## 2014-04-13 ENCOUNTER — Encounter (HOSPITAL_BASED_OUTPATIENT_CLINIC_OR_DEPARTMENT_OTHER): Payer: 59

## 2014-04-13 DIAGNOSIS — C7802 Secondary malignant neoplasm of left lung: Secondary | ICD-10-CM

## 2014-04-13 DIAGNOSIS — C099 Malignant neoplasm of tonsil, unspecified: Secondary | ICD-10-CM

## 2014-04-13 DIAGNOSIS — Z5111 Encounter for antineoplastic chemotherapy: Secondary | ICD-10-CM

## 2014-04-13 DIAGNOSIS — C7801 Secondary malignant neoplasm of right lung: Secondary | ICD-10-CM | POA: Diagnosis not present

## 2014-04-13 LAB — CBC WITH DIFFERENTIAL/PLATELET
BASOS PCT: 1 % (ref 0–1)
Basophils Absolute: 0 10*3/uL (ref 0.0–0.1)
EOS ABS: 0.1 10*3/uL (ref 0.0–0.7)
Eosinophils Relative: 2 % (ref 0–5)
HCT: 34.5 % — ABNORMAL LOW (ref 39.0–52.0)
HEMOGLOBIN: 11.5 g/dL — AB (ref 13.0–17.0)
LYMPHS ABS: 0.4 10*3/uL — AB (ref 0.7–4.0)
Lymphocytes Relative: 11 % — ABNORMAL LOW (ref 12–46)
MCH: 31.3 pg (ref 26.0–34.0)
MCHC: 33.3 g/dL (ref 30.0–36.0)
MCV: 94 fL (ref 78.0–100.0)
MONO ABS: 0.5 10*3/uL (ref 0.1–1.0)
MONOS PCT: 12 % (ref 3–12)
NEUTROS ABS: 2.8 10*3/uL (ref 1.7–7.7)
Neutrophils Relative %: 75 % (ref 43–77)
Platelets: 103 10*3/uL — ABNORMAL LOW (ref 150–400)
RBC: 3.67 MIL/uL — AB (ref 4.22–5.81)
RDW: 17.5 % — ABNORMAL HIGH (ref 11.5–15.5)
WBC: 3.7 10*3/uL — ABNORMAL LOW (ref 4.0–10.5)

## 2014-04-13 LAB — COMPREHENSIVE METABOLIC PANEL
ALT: 11 U/L (ref 0–53)
ANION GAP: 6 (ref 5–15)
AST: 19 U/L (ref 0–37)
Albumin: 3.8 g/dL (ref 3.5–5.2)
Alkaline Phosphatase: 65 U/L (ref 39–117)
BILIRUBIN TOTAL: 0.5 mg/dL (ref 0.3–1.2)
BUN: 21 mg/dL (ref 6–23)
CO2: 31 mmol/L (ref 19–32)
CREATININE: 0.6 mg/dL (ref 0.50–1.35)
Calcium: 9.2 mg/dL (ref 8.4–10.5)
Chloride: 103 mmol/L (ref 96–112)
GFR calc Af Amer: >90 mL/min (ref >90)
Glucose, Bld: 97 mg/dL (ref 70–99)
Potassium: 4 mmol/L (ref 3.5–5.1)
SODIUM: 140 mmol/L (ref 135–145)
Total Protein: 7.2 g/dL (ref 6.0–8.3)

## 2014-04-13 MED ORDER — SODIUM CHLORIDE 0.9 % IV SOLN
40.0000 mg/m2 | Freq: Once | INTRAVENOUS | Status: AC
  Administered 2014-04-13: 88 mg via INTRAVENOUS
  Filled 2014-04-13: qty 88

## 2014-04-13 MED ORDER — POTASSIUM CHLORIDE 2 MEQ/ML IV SOLN
Freq: Once | INTRAVENOUS | Status: AC
  Administered 2014-04-13: 09:00:00 via INTRAVENOUS
  Filled 2014-04-13: qty 10

## 2014-04-13 MED ORDER — PALONOSETRON HCL INJECTION 0.25 MG/5ML
0.2500 mg | Freq: Once | INTRAVENOUS | Status: AC
  Administered 2014-04-13: 0.25 mg via INTRAVENOUS
  Filled 2014-04-13: qty 5

## 2014-04-13 MED ORDER — SODIUM CHLORIDE 0.9 % IV SOLN
Freq: Once | INTRAVENOUS | Status: AC
  Administered 2014-04-13: 11:00:00 via INTRAVENOUS

## 2014-04-13 MED ORDER — PACLITAXEL CHEMO INJECTION 300 MG/50ML
64.0000 mg/m2 | Freq: Once | INTRAVENOUS | Status: AC
  Administered 2014-04-13: 138 mg via INTRAVENOUS
  Filled 2014-04-13: qty 23

## 2014-04-13 MED ORDER — SODIUM CHLORIDE 0.9 % IJ SOLN: 10.0000 mL | INTRAMUSCULAR | Status: DC | PRN

## 2014-04-13 MED ORDER — DEXAMETHASONE SODIUM PHOSPHATE 10 MG/ML IJ SOLN: 10.0000 mg | Freq: Once | INTRAMUSCULAR | Status: DC

## 2014-04-13 MED ORDER — HEPARIN SOD (PORK) LOCK FLUSH 100 UNIT/ML IV SOLN
500.0000 [IU] | Freq: Once | INTRAVENOUS | Status: AC | PRN
  Administered 2014-04-13: 500 [IU]
  Filled 2014-04-13: qty 5

## 2014-04-13 MED ORDER — FOSAPREPITANT DIMEGLUMINE INJECTION 150 MG
Freq: Once | INTRAVENOUS | Status: AC
  Administered 2014-04-13: 11:00:00 via INTRAVENOUS
  Filled 2014-04-13: qty 5

## 2014-04-13 MED ORDER — LORATADINE 10 MG PO TABS
10.0000 mg | ORAL_TABLET | Freq: Every day | ORAL | Status: DC
  Administered 2014-04-13: 10 mg via ORAL
  Filled 2014-04-13: qty 1

## 2014-04-13 NOTE — Patient Instructions (Signed)
..  [facility] Discharge Instructions for Patients Receiving Chemotherapy  Today you received the following chemotherapy agents taxol and cisplatin  Return in 1 week to review PET scan and to possibly get chemo   To help prevent nausea and vomiting after your treatment, we encourage you to take your nausea medication     If you develop nausea and vomiting, or diarrhea that is not controlled by your medication, call the clinic.  The clinic phone number is [phone]. Office hours are Monday-Friday 8:30am-5:00pm.  BELOW ARE SYMPTOMS THAT SHOULD BE REPORTED IMMEDIATELY:  *FEVER GREATER THAN 101.0 F  *CHILLS WITH OR WITHOUT FEVER  NAUSEA AND VOMITING THAT IS NOT CONTROLLED WITH YOUR NAUSEA MEDICATION  *UNUSUAL SHORTNESS OF BREATH  *UNUSUAL BRUISING OR BLEEDING  TENDERNESS IN MOUTH AND THROAT WITH OR WITHOUT PRESENCE OF ULCERS  *URINARY PROBLEMS  *BOWEL PROBLEMS  UNUSUAL RASH Items with * indicate a potential emergency and should be followed up as soon as possible. If you have an emergency after office hours please contact your primary care physician or go to the nearest emergency department.  Please call the clinic during office hours if you have any questions or concerns.   You may also contact the Patient Navigator at [phone] should you have any questions or need assistance in obtaining follow up care. _____________________________________________________________________ Have you asked about our STAR program?    STAR stands for Survivorship Training and Rehabilitation, and this is a nationally recognized cancer care program that focuses on survivorship and rehabilitation.  Cancer and cancer treatments may cause problems, such as, pain, making you feel tired and keeping you from doing the things that you need or want to do. Cancer rehabilitation can help. Our goal is to reduce these troubling effects and help you have the best quality of life  possible.  You may receive a survey from a nurse that asks questions about your current state of health.  Based on the survey results, all eligible patients will be referred to the [facility] program for an evaluation so we can better serve you! A frequently asked questions sheet is available upon request.

## 2014-04-17 ENCOUNTER — Encounter (HOSPITAL_COMMUNITY)
Admission: RE | Admit: 2014-04-17 | Discharge: 2014-04-17 | Disposition: A | Discharge status: Home / Self Care | Payer: 59 | Source: Ambulatory Visit | Attending: Hematology & Oncology | Admitting: Hematology & Oncology

## 2014-04-17 DIAGNOSIS — C7801 Secondary malignant neoplasm of right lung: Secondary | ICD-10-CM | POA: Insufficient documentation

## 2014-04-17 DIAGNOSIS — C099 Malignant neoplasm of tonsil, unspecified: Secondary | ICD-10-CM | POA: Diagnosis present

## 2014-04-17 LAB — GLUCOSE, CAPILLARY: Glucose-Capillary: 92 mg/dL (ref 70–99)

## 2014-04-17 MED ORDER — FLUDEOXYGLUCOSE F - 18 (FDG) INJECTION
10.2400 | Freq: Once | INTRAVENOUS | Status: AC | PRN
  Administered 2014-04-17: 10.24 via INTRAVENOUS

## 2014-04-20 ENCOUNTER — Encounter (HOSPITAL_COMMUNITY): Payer: 59 | Attending: Hematology & Oncology

## 2014-04-20 ENCOUNTER — Encounter (HOSPITAL_COMMUNITY): Payer: Self-pay | Admitting: Hematology & Oncology

## 2014-04-20 ENCOUNTER — Encounter (HOSPITAL_BASED_OUTPATIENT_CLINIC_OR_DEPARTMENT_OTHER): Payer: 59 | Admitting: Hematology & Oncology

## 2014-04-20 DIAGNOSIS — C7802 Secondary malignant neoplasm of left lung: Secondary | ICD-10-CM | POA: Diagnosis not present

## 2014-04-20 DIAGNOSIS — C099 Malignant neoplasm of tonsil, unspecified: Secondary | ICD-10-CM | POA: Diagnosis not present

## 2014-04-20 DIAGNOSIS — C7801 Secondary malignant neoplasm of right lung: Secondary | ICD-10-CM

## 2014-04-20 DIAGNOSIS — Z5111 Encounter for antineoplastic chemotherapy: Secondary | ICD-10-CM

## 2014-04-20 DIAGNOSIS — Z9221 Personal history of antineoplastic chemotherapy: Secondary | ICD-10-CM | POA: Diagnosis not present

## 2014-04-20 DIAGNOSIS — Z452 Encounter for adjustment and management of vascular access device: Secondary | ICD-10-CM | POA: Diagnosis not present

## 2014-04-20 DIAGNOSIS — Z923 Personal history of irradiation: Secondary | ICD-10-CM | POA: Diagnosis not present

## 2014-04-20 LAB — CBC WITH DIFFERENTIAL/PLATELET
Basophils Absolute: 0 10*3/uL (ref 0.0–0.1)
Basophils Relative: 1 % (ref 0–1)
EOS ABS: 0.1 10*3/uL (ref 0.0–0.7)
EOS PCT: 1 % (ref 0–5)
HEMATOCRIT: 32 % — AB (ref 39.0–52.0)
Hemoglobin: 10.6 g/dL — ABNORMAL LOW (ref 13.0–17.0)
LYMPHS PCT: 9 % — AB (ref 12–46)
Lymphs Abs: 0.4 10*3/uL — ABNORMAL LOW (ref 0.7–4.0)
MCH: 31.4 pg (ref 26.0–34.0)
MCHC: 33.1 g/dL (ref 30.0–36.0)
MCV: 94.7 fL (ref 78.0–100.0)
MONO ABS: 0.2 10*3/uL (ref 0.1–1.0)
Monocytes Relative: 5 % (ref 3–12)
Neutro Abs: 3.7 10*3/uL (ref 1.7–7.7)
Neutrophils Relative %: 84 % — ABNORMAL HIGH (ref 43–77)
Platelets: 90 10*3/uL — ABNORMAL LOW (ref 150–400)
RBC: 3.38 MIL/uL — ABNORMAL LOW (ref 4.22–5.81)
RDW: 16.9 % — ABNORMAL HIGH (ref 11.5–15.5)
WBC: 4.4 10*3/uL (ref 4.0–10.5)

## 2014-04-20 LAB — COMPREHENSIVE METABOLIC PANEL
ALK PHOS: 66 U/L (ref 39–117)
ALT: 11 U/L (ref 0–53)
AST: 17 U/L (ref 0–37)
Albumin: 3.8 g/dL (ref 3.5–5.2)
Anion gap: 6 (ref 5–15)
BILIRUBIN TOTAL: 0.3 mg/dL (ref 0.3–1.2)
BUN: 19 mg/dL (ref 6–23)
CO2: 29 mmol/L (ref 19–32)
Calcium: 8.8 mg/dL (ref 8.4–10.5)
Chloride: 102 mmol/L (ref 96–112)
Creatinine, Ser: 0.65 mg/dL (ref 0.50–1.35)
Glucose, Bld: 107 mg/dL — ABNORMAL HIGH (ref 70–99)
POTASSIUM: 4 mmol/L (ref 3.5–5.1)
Sodium: 137 mmol/L (ref 135–145)
Total Protein: 7 g/dL (ref 6.0–8.3)

## 2014-04-20 MED ORDER — PALONOSETRON HCL INJECTION 0.25 MG/5ML
0.2500 mg | Freq: Once | INTRAVENOUS | Status: AC
  Administered 2014-04-20: 0.25 mg via INTRAVENOUS
  Filled 2014-04-20: qty 5

## 2014-04-20 MED ORDER — SODIUM CHLORIDE 0.9 % IJ SOLN: 10.0000 mL | INTRAMUSCULAR | Status: DC | PRN

## 2014-04-20 MED ORDER — POTASSIUM CHLORIDE 2 MEQ/ML IV SOLN
Freq: Once | INTRAVENOUS | Status: AC
  Administered 2014-04-20: 10:00:00 via INTRAVENOUS
  Filled 2014-04-20: qty 10

## 2014-04-20 MED ORDER — HEPARIN SOD (PORK) LOCK FLUSH 100 UNIT/ML IV SOLN
500.0000 [IU] | Freq: Once | INTRAVENOUS | Status: AC | PRN
  Administered 2014-04-20: 500 [IU]
  Filled 2014-04-20: qty 5

## 2014-04-20 MED ORDER — LORATADINE 10 MG PO TABS
10.0000 mg | ORAL_TABLET | Freq: Every day | ORAL | Status: DC
  Administered 2014-04-20: 10 mg via ORAL
  Filled 2014-04-20: qty 1

## 2014-04-20 MED ORDER — SODIUM CHLORIDE 0.9 % IV SOLN
Freq: Once | INTRAVENOUS | Status: AC
  Administered 2014-04-20: 09:00:00 via INTRAVENOUS

## 2014-04-20 MED ORDER — SODIUM CHLORIDE 0.9 % IV SOLN
Freq: Once | INTRAVENOUS | Status: AC
  Administered 2014-04-20: 12:00:00 via INTRAVENOUS
  Filled 2014-04-20: qty 5

## 2014-04-20 MED ORDER — PACLITAXEL CHEMO INJECTION 300 MG/50ML
64.0000 mg/m2 | Freq: Once | INTRAVENOUS | Status: AC
  Administered 2014-04-20: 138 mg via INTRAVENOUS
  Filled 2014-04-20: qty 23

## 2014-04-20 MED ORDER — DEXAMETHASONE SODIUM PHOSPHATE 10 MG/ML IJ SOLN: 10.0000 mg | Freq: Once | INTRAMUSCULAR | Status: DC

## 2014-04-20 MED ORDER — SODIUM CHLORIDE 0.9 % IV SOLN
34.0000 mg/m2 | Freq: Once | INTRAVENOUS | Status: AC
  Administered 2014-04-20: 75 mg via INTRAVENOUS
  Filled 2014-04-20: qty 75

## 2014-04-20 NOTE — Patient Instructions (Addendum)
..  [place] [facility] Discharge Instructions  RECOMMENDATIONS MADE BY THE CONSULTANT AND ANY TEST RESULTS WILL BE SENT TO YOUR REFERRING PHYSICIAN.  You have had a good response to the Chemo  We will treat you today then switch to a pill called xeloda. This is a chemo pill that you will get from a specialty pharmacy in the mail..  We will get this ordered and Hildred Alamin will be in touch to teach you about the medicine.  Thank you for choosing [facility] to provide your oncology and hematology care.  To afford each patient quality time with our provider, please arrive at least 15 minutes before your scheduled appointment time.    You need to re-schedule your appointment should you arrive 10 or more minutes late.  We strive to give you quality time with our providers, and arriving late affects you and other patients whose appointments are after yours.  Also, if you no show three or more times for appointments you may be dismissed from the clinic at the providers discretion.     Again, thank you for choosing [facility].  Our hope is that these requests will decrease the amount of time that you wait before being seen by our physicians.       _____________________________________________________________  Should you have questions after your visit to [facility], please contact our office at [phone] between the hours of 8:30 a.m. and 4:30 p.m.  Voicemails left after 4:30 p.m. will not be returned until the following business day.  For prescription refill requests, have your pharmacy contact our office.

## 2014-04-20 NOTE — Progress Notes (Signed)
Tolerated infusion well. 

## 2014-04-20 NOTE — Progress Notes (Signed)
. [facility] Progress Note  Patient Care Team: Sharilyn Sites, MD as PCP - General (Family Medicine) Leota Sauers, RN as Registered Nurse (Oncology) Heath Lark, MD as Consulting Physician (Hematology and Oncology) Eppie Gibson, MD as Attending Physician (Radiation Oncology) Patrici Ranks, MD as Consulting Physician (Internal Medicine)  Diagnosis: Squamous Cell Carcinoma of the right tonsil, p16+, T3N2cM0, Stage IVA  Right tonsil and bilateral neck / 70 Gy in 35 fractions to gross disease, 63 Gy in 35 fractions to high risk nodal echelons, and 56 Gy in 35 fractions to intermediate risk nodal echelons 05/02/2013-06/22/2013   Oncology History   Tonsil cancer, HPV positive   Primary site: Pharynx - Oropharynx (Right)   Staging method: AJCC 7th Edition   Clinical free text: HPV positive   Clinical: Stage IVA (T3, N2c, M0) signed by Heath Lark, MD on 04/20/2013  9:29 PM   Summary: Stage IVA (T3, N2c, M0)       Tonsil cancer   02/01/2013 Imaging Ultrasound of the neck revealed bilateral lymphadenopathy in the submandibular region   03/16/2013 Imaging CT scan of the neck show large right tonsil mass measured 3.9 cm in maximum dimension as well as bilateral lymphadenopathy, the largest lymph node measures 31 mm. There is also a left thyroid mass measured 44 mm   03/21/2013 Procedure The patient was seen by ENT with laryngoscopy and biopsy. Pathology is pending   03/25/2013 Imaging PET scan showed large hypermetabolic soft tissue mass in the region of the right palatine tonsil with bilateral cervical hypermetabolic lymphadenopathy indicative of metastatic disease,   04/04/2013 Surgery He underwent placement of Port-A-Cath and feeding tube   04/14/2013 Procedure The patient underwent teeth extraction.   05/02/2013 - 06/13/2013 Chemotherapy The dosage of chemotherapy was interrupted many times due to side effects. His treatment is terminated early due to severe ulcerated skin toxicity.    05/02/2013 - 06/22/2013 Radiation Therapy The patient completed radiation treatment.   10/18/2013 Imaging PET CT scan showed complete response in the oropharynx. However, he has evidence of new bilateral pulmonary metastasis.   12/16/2013 Imaging Repeat PET CT scan show significant progression of pulmonary metastases    01/25/2014 Pathology Results Squamous Cell Carcinoma, CT guided biopsy of RUL nodule   02/08/2014 -  Chemotherapy Cisplatin/Taxol weekly     Imaging PET Interval improvement in size and hypermetabolism of the patient's multiple bilateral pulmonary metastases. No evidence for abnormal FDG uptake in the tongue base on the current study.Stable enlargement of the left thyroid lobe.     HISTORY OF PRESENTING ILLNESS:  [name] 64 y.o. 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 He continues to do very well. He noticed a slight worsening of his neuropathy in his feet for about 3 days after his last treatment. His neuropathy is now back to his baseline. He still has difficulties with his lower dentures the thymic it difficult to eat, but he states he is a very good appetite. He remains active, working outside regularly. He is here today for PET scan review and additional recommendations regarding treatment.  MEDICAL HISTORY:  Past Medical History  Diagnosis Date  . Chest pain     10/14  . Urethral stricture     s/p dilitation  . Neuropathy     compression neuropathy right hip;s/p replacement  . Allergy   . Fibromyalgia   . Anxiety   . Depression   . Constipation   .  Arthritis     knees, HIps, Hands  . Tonsillar cancer   . Type II diabetes mellitus   . Pneumonia 2012  . Complication of anesthesia     bleeding during intubation 04/04/13 due to friability of right tonsillar cancer  . PEG (percutaneous endoscopic gastrostomy) status   . S/P radiation therapy 05/02/2013-06/22/2013    70 [facility] - Squamous Cell Carcinoma of the tonsil, p16+, T3N2cM0     SURGICAL HISTORY: Past Surgical History  Procedure Laterality Date  . Total hip arthroplasty Right 2000    Dr. Percell Miller  . Cystoscopy      Dr. Karsten Ro  . Portacath placement Right 04/04/2013  . Gastrostomy tube placement  04/04/2013  . Portacath placement N/A 04/04/2013    Procedure: INSERTION PORT-A-CATH;  Surgeon: Ralene Ok, MD;  Location: [facility];  Service: General;  Laterality: N/A;  . Laparoscopic gastrostomy N/A 04/04/2013    Procedure: LAPAROSCOPIC GASTROSTOMY TUBE PLACEMENT ;  Surgeon: Ralene Ok, MD;  Location: [facility];  Service: General;  Laterality: N/A;  . Nasal hemorrhage control N/A 04/04/2013    Procedure: Control of oropharyngeal hemorrhage;  Surgeon: Ascencion Dike, MD;  Location: [facility] OR;  Service: ENT;  Laterality: N/A;  . Multiple extractions with alveoloplasty N/A 04/14/2013    Procedure: Extraction of tooth #'s 1,2,3,4,5,6,7,8,9,10,11,12,13,14,15,17,18,19,20,21,22,23,24,25,26,27,28,29, 30, 31, and 32 with alveoloplasty and bilateral mandibular tori reductions.;  Surgeon: Lenn Cal, DDS;  Location: [facility];  Service: Oral Surgery;  Laterality: N/A;    SOCIAL HISTORY: History   Social History  . Marital Status: Married    Spouse Name: N/A  . Number of Children: N/A  . Years of Education: N/A   Occupational History  . Not on file.   Social History Main Topics  . Smoking status: Never Smoker   . Smokeless tobacco: Never Used  . Alcohol Use: No     Comment: Occasional beer  . Drug Use: No  . Sexual Activity: Yes   Other Topics Concern  . Not on file   Social History Narrative  Married for 20 years. They have no children. They have a dog. He has a daughter from a prior relationship, they are estranged. No smoking, worked at [employer]. He has never smoked. Occasional alcohol.  FAMILY HISTORY: Family History  Problem Relation Age of Onset  . Hyperlipidemia Brother   . Cancer Mother     Deceased with leukemia, had uterine ca  . Cancer Cousin      living brain cancer, male   indicated that his mother is deceased. He indicated that his father is deceased. He indicated that his cousin is alive.   His mother had cervical cancer and leukemia. 1 brother who is healthy.  ALLERGIES:  is allergic to neurontin and dilaudid.  MEDICATIONS:  Current Outpatient Prescriptions  Medication Sig Dispense Refill  . acetaminophen (TYLENOL) 500 MG tablet Take 500-1,000 mg by mouth every 4 (four) hours as needed for mild pain.     [name] alprazolam (XANAX) 2 MG tablet Take 2 mg by mouth 3 (three) times daily as needed for sleep or anxiety.     . Cholecalciferol (VITAMIN D3) 5000 UNITS CAPS Take 5,000 Units by mouth daily.    [name] CISPLATIN IV Inject into the vein. Weekly starting 02/07/14    . citalopram (CELEXA) 20 MG tablet Take 1 tablet (20 mg total) by mouth daily. 30 tablet 1  . diphenhydrAMINE (BENADRYL) 25 mg capsule Take 1 capsule (25 mg total) by mouth every 6 (  six) hours as needed for itching. 60 capsule 0  . docusate sodium (COLACE) 100 MG capsule Take 100 mg by mouth 2 (two) times daily as needed for mild constipation.    . lidocaine-prilocaine (EMLA) cream Apply a quarter size amount to port site 1 hour prior to chemo. Do not rub in. Cover with plastic wrap. 30 g 3  . ondansetron (ZOFRAN) 8 MG tablet Take 1 tablet (8 mg total) by mouth every 8 (eight) hours as needed for nausea. 60 tablet 3  . OxyCODONE (OXYCONTIN) 10 mg T12A 12 hr tablet Take 1 tablet (10 mg total) by mouth every 12 (twelve) hours. 60 tablet 0  . oxyCODONE-acetaminophen (PERCOCET) 10-325 MG per tablet Take 1 tablet by mouth every 4 (four) hours as needed for pain. 30 tablet 0  . PACLitaxel (TAXOL IV) Inject into the vein. Weekly starting 02/07/14    . polyethylene glycol (MIRALAX / GLYCOLAX) packet Take 17 g by mouth as needed.    . pregabalin (LYRICA) 150 MG capsule Take 2 capsules (300 mg total) by mouth 2 (two) times daily. (Patient taking differently: Take 300 mg by mouth 2  (two) times daily. 2 in am and 2 at night) 60 capsule 3  . promethazine (PHENERGAN) 25 MG tablet Take 1 tablet (25 mg total) by mouth every 6 (six) hours as needed for nausea. 60 tablet 3  . zolpidem (AMBIEN CR) 6.25 MG CR tablet Take 6.25 mg by mouth at bedtime as needed for sleep.     No current facility-administered medications for this visit.   Facility-Administered Medications Ordered in Other Visits  Medication Dose Route Frequency Provider Last Rate Last Dose  . loratadine (CLARITIN) tablet 10 mg  10 mg Oral Daily Patrici Ranks, MD   10 mg at 04/20/14 1133  . sodium chloride 0.9 % injection 10 mL  10 mL Intracatheter PRN Patrici Ranks, MD        Review of Systems  Constitutional: Negative for fever, chills, weight loss and malaise/fatigue.  HENT: Negative for congestion, hearing loss, nosebleeds, sore throat and tinnitus.   Eyes: Negative for blurred vision, double vision, pain and discharge.  Respiratory: Negative for cough, hemoptysis, sputum production, shortness of breath and wheezing.   Cardiovascular: Negative for chest pain, palpitations, claudication, leg swelling and PND.  Gastrointestinal: Negative for heartburn, nausea, vomiting, abdominal pain, diarrhea, constipation, blood in stool and melena.  Genitourinary: Negative for dysuria, urgency, frequency and hematuria.  Musculoskeletal: Negative for myalgias, joint pain and falls.  Skin: Negative for itching and rash.  Neurological: Negative for dizziness, tingling, tremors, sensory change, speech change, focal weakness, seizures, loss of consciousness, weakness and headaches.  Endo/Heme/Allergies: Does not bruise/bleed easily.  Psychiatric/Behavioral: Negative for depression, suicidal ideas, memory loss and substance abuse. The patient is not nervous/anxious and does not have insomnia.     PHYSICAL EXAMINATION:  ECOG PERFORMANCE STATUS: 0 - Asymptomatic  There were no vitals filed for this visit. There were no  vitals filed for this visit.   Physical Exam  Constitutional: He is oriented to person, place, and time and well-developed, well-nourished, and in no distress.  HENT:  Head: Normocephalic and atraumatic.  Nose: Nose normal.  Mouth/Throat: Oropharynx is clear and moist. No oropharyngeal exudate.  XRT changes to the neck, mild edema noted  Eyes: Conjunctivae and EOM are normal. Pupils are equal, round, and reactive to light. Right eye exhibits no discharge. Left eye exhibits no discharge. No scleral icterus.  Neck: Normal range of  motion. Neck supple. No tracheal deviation present. No thyromegaly present.  Cardiovascular: Normal rate, regular rhythm and normal heart sounds.  Exam reveals no gallop and no friction rub.   No murmur heard. Pulmonary/Chest: Effort normal and breath sounds normal. He has no wheezes. He has no rales.  Abdominal: Soft. Bowel sounds are normal. He exhibits no distension and no mass. There is no tenderness. There is no rebound and no guarding.  Musculoskeletal: Normal range of motion. He exhibits no edema.  Lymphadenopathy:    He has no cervical adenopathy.  Neurological: He is alert and oriented to person, place, and time. He has normal reflexes. No cranial nerve deficit. Gait normal. Coordination normal.  Skin: Skin is warm and dry. No rash noted.  Psychiatric: Mood, memory, affect and judgment normal.  Nursing note and vitals reviewed.    LABORATORY DATA:  I have reviewed the data as listed Lab Results  Component Value Date   WBC 4.4 04/20/2014   HGB 10.6* 04/20/2014   HCT 32.0* 04/20/2014   MCV 94.7 04/20/2014   PLT 90* 04/20/2014     Chemistry      Component Value Date/Time   NA 137 04/20/2014 0920   NA 142 12/16/2013 1305   K 4.0 04/20/2014 0920   K 4.7 12/16/2013 1305   CL 102 04/20/2014 0920   CO2 29 04/20/2014 0920   CO2 32* 12/16/2013 1305   BUN 19 04/20/2014 0920   BUN 16.6 12/16/2013 1305   CREATININE 0.65 04/20/2014 0920    CREATININE 0.8 12/16/2013 1305      Component Value Date/Time   CALCIUM 8.8 04/20/2014 0920   CALCIUM 9.9 12/16/2013 1305   ALKPHOS 66 04/20/2014 0920   ALKPHOS 84 12/16/2013 1305   AST 17 04/20/2014 0920   AST 20 12/16/2013 1305   ALT 11 04/20/2014 0920   ALT 13 12/16/2013 1305   BILITOT 0.3 04/20/2014 0920   BILITOT 0.45 12/16/2013 1305      ASSESSMENT & PLAN:  Stage IV squamous cell carcinoma of the head and neck  64 year old male with 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 He is on Cisplatin and Taxol given weekly. He has done very well with therapy. His performance status is a 0-1.   I reviewed the PET scan results with the patient and his wife. His disease is overall improved. We discussed proceeding with an additional cycle of treatment today, we will dose reduce secondary to his neuropathy. We will then take a short break and proceed forward with Xeloda. They did not feel comfortable stopping all therapy for a chemotherapy holiday. I think Xeloda as both "maintenance" and treatment will be well tolerated. I also discussed with him that he will have fewer office visits here and more time at home and in nicer weather which is his strongest desire.  All questions were answered. The patient knows to call the clinic with any problems, questions or concerns.   Molli Hazard, MD MD 04/20/2014 4:41 PM

## 2014-04-25 ENCOUNTER — Other Ambulatory Visit (HOSPITAL_COMMUNITY): Payer: Self-pay | Admitting: Hematology & Oncology

## 2014-04-25 MED ORDER — CAPECITABINE 500 MG PO TABS: ORAL_TABLET | ORAL | Status: DC

## 2014-05-01 ENCOUNTER — Telehealth (HOSPITAL_COMMUNITY): Payer: Self-pay

## 2014-05-01 NOTE — Telephone Encounter (Signed)
05/01/14                     Called and spoke w/Donna Attridge regarding scheduling pt. for Adj of U/L Dentures for 05/02/14 w/Dr. Arnoldo Lenis.  [name] Sturgell stated not able to schedule at this time due to other appts. and will call Dental Medicine back to schedule at a later date.  LRI

## 2014-05-02 NOTE — Patient Instructions (Addendum)
[facility]   CHEMOTHERAPY INSTRUCTIONS  Xeloda - diarrhea, hand-foot syndrome (hands/feet can get red/tender/and skin can peel). Avoid friction and hot environments on hands/feet. Wear cotton socks. Lotion twice a day to hands/fingers/feet/toes with Udder cream. Mucositis (inflammation of any mucosal membrane can develop -this can occur in the throat/mouth). Mouth sores, nausea/vomiting, anemia, fatigue can also develop. Take Imodium if diarrhea develops and contact us immediately - we will give you further instructions on how to take your Imodium. Xeloda usually comes with a teaching packet from the mail order/specialty pharmacy that will supply you with the drug that is really informative about diarrhea and hand-foot syndrome. No pregnant, child bearing age people, or animals should come into contact with this drug. Even though this is in pill form - it is still very powerful!!! If you should be instructed to discontinue taking this drug, bring the drug into the [facility] and we will dispose of it in a safe manner. Chemotherapy is a biohazard and must be disposed of properly. Do not touch this pill much. It would be best if the caregiver wore gloves while handling. Do not put this pill in with the rest of your pills in a pill box. Keep them in a separate pill box. You should take this medication within 30 minutes after eating your am and pm meals.   Your ordered dose of Xeloda: Take 4 tablets ('2000mg'$ ) in the morning with food and 3 tablets ('1500mg'$ ) in the evening with food. Take for 7 days in a row and then take 7 days off. Repeat.   POTENTIAL SIDE EFFECTS OF TREATMENT: Increased Susceptibility to Infection, Vomiting, Constipation, Hair Thinning, Changes in Character of Skin and Nails (brittleness, dryness,etc.), Pigment Changes (darkening of veins, nail beds, palms of hands, soles of feet, etc.), Bone Marrow Suppression, Nausea, Diarrhea, Sun Sensitivity and  Mouth Sores   EDUCATIONAL MATERIALS GIVEN AND REVIEWED: Chemotherapy and You booklet given Specific Instructions Sheets: Xeloda, Zofran   SELF CARE ACTIVITIES WHILE ON CHEMOTHERAPY: Increase your fluid intake 48 hours prior to treatment and drink at least 2 quarts per day after treatment., No alcohol intake., No aspirin or other medications unless approved by your oncologist., Eat foods that are light and easy to digest., Eat foods at cold or room temperature., No fried, fatty, or spicy foods immediately before or after treatment., Have teeth cleaned professionally before starting treatment. Keep dentures and partial plates clean., Use soft toothbrush and do not use mouthwashes that contain alcohol. Biotene is a good mouthwash that is available at most pharmacies or may be ordered by calling [phone]., Use warm salt water gargles (1 teaspoon salt per 1 quart warm water) before and after meals and at bedtime. Or you may rinse with 2 tablespoons of three -percent hydrogen peroxide mixed in eight ounces of water., Always use sunscreen with SPF (Sun Protection Factor) of 30 or higher., Use your nausea medication as directed to prevent nausea., Use your stool softener or laxative as directed to prevent constipation. and Use your anti-diarrheal medication as directed to stop diarrhea.  Please wash your hands for at least 30 seconds using warm soapy water. Handwashing is the #1 way to prevent the spread of germs. Stay away from sick people or people who are getting over a cold. If you develop respiratory systems such as green/yellow mucus production or productive cough or persistent cough let us know and we will see if you need an antibiotic. It is a good  idea to keep a pair of gloves on when going into grocery stores/[employer] to decrease your risk of coming into contact with germs on the carts, etc. Carry alcohol hand gel with you at all times and use it frequently if out in public. All foods need to be  cooked thoroughly. No raw foods. No medium or undercooked meats, eggs. If your food is cooked medium well, it does not need to be hot pink or saturated with bloody liquid at all. Vegetables and fruits need to be washed/rinsed under the faucet with a dish detergent before being consumed. You can eat raw fruits and vegetables unless we tell you otherwise but it would be best if you cooked them or bought frozen. Do not eat off of salad bars or hot bars unless you really trust the cleanliness of the restaurant. If you need dental work, please let Dr. Whitney Muse know before you go for your appointment so that we can coordinate the best possible time for you in regards to your chemo regimen. You need to also let your dentist know that you are actively taking chemo. We may need to do labs prior to your dental appointment. We also want your bowels moving at least every other day. If this is not happening, we need to know so that we can get you on a bowel regimen to help you go.      MEDICATIONS: You have been given prescriptions for the following medications:  Zofran '8mg'$  tablet. Take 1 tablet every 8 hours as needed for nausea/vomiting.   Xeloda '500mg'$  tablet. Take 4 tablets ('2000mg'$ ) in the morning with food and 3 tablets ('1500mg'$ ) in the evening with food. Take for 7 days in a row and then take 7 days off. Repeat.  Over-the-Counter Meds:  Miralax 17 grams. Take 1 capful (17 grams) in 8oz of fluid daily. You may increase to two times a day if needed. This is a stool softener.  Senna/Senokot S - Take 1 tablet twice a day and increase to 4 tablets twice a day if needed. This is a stimulant laxative.   Milk of Magnesia - this is a laxative used to treat moderate to severe constipation. May take 2-4 tablespoons every 8 hours as needed. May increase to 8 tablespoons x 1 dose and if no bowel movement call the [facility].  Imodium - this is for diarrhea. Take 2 tabs after 1st loose stool and then 1 tab every 2  hours until you go a total of 12 hours without a loose stool. Call [facility] if loose stools continue.    SYMPTOMS TO REPORT AS SOON AS POSSIBLE AFTER TREATMENT:  FEVER GREATER THAN 100.5 F  CHILLS WITH OR WITHOUT FEVER  NAUSEA AND VOMITING THAT IS NOT CONTROLLED WITH YOUR NAUSEA MEDICATION  UNUSUAL SHORTNESS OF BREATH  UNUSUAL BRUISING OR BLEEDING  TENDERNESS IN MOUTH AND THROAT WITH OR WITHOUT PRESENCE OF ULCERS  URINARY PROBLEMS  BOWEL PROBLEMS  UNUSUAL RASH    Wear comfortable clothing and clothing appropriate for easy access to any Portacath or PICC line. Let us know if there is anything that we can do to make your therapy better!      I have been informed and understand all of the instructions given to me and have received a copy. I have been instructed to call the clinic [phone] or my family physician as soon as possible for continued medical care, if indicated. I do not have any more questions at this time  but understand that I may call the [facility] or the Patient Navigator at [phone] during office hours should I have questions or need assistance in obtaining follow-up care.            Capecitabine tablets What is this medicine? CAPECITABINE (ka pe SITE a been) is a chemotherapy drug. It slows the growth of cancer cells. This medicine is used to treat breast cancer, and also colon or rectal cancer. This medicine may be used for other purposes; ask your health care provider or pharmacist if you have questions. COMMON BRAND NAME(S): Xeloda What should I tell my health care provider before I take this medicine? They need to know if you have any of these conditions: -bleeding or blood disorders -dihydropyrimidine dehydrogenase (DPD) deficiency -heart disease -infection (especially a virus infection such as chickenpox, cold sores, or herpes) -kidney disease -liver disease -an unusual or allergic reaction to capecitabine,  5-fluorouracil, other medicines, foods, dyes, or preservatives -pregnant or trying to get pregnant -breast-feeding How should I use this medicine? Take this medicine by mouth with a glass of water, within 30 minutes of the end of a meal. Do not cut, crush or chew this medicine. Follow the directions on the prescription label. Take your medicine at regular intervals. Do not take it more often than directed. Do not stop taking except on your doctor's advice. Your doctor may want you to take a combination of 150 mg and 500 mg tablets for each dose. It is very important that you know how to correctly take your dose. Taking the wrong tablets could result in an overdose (too much medication) or underdose (too little medication). Talk to your pediatrician regarding the use of this medicine in children. Special care may be needed. Overdosage: If you think you have taken too much of this medicine contact a poison control center or emergency room at once. NOTE: This medicine is only for you. Do not share this medicine with others. What if I miss a dose? If you miss a dose, do not take the missed dose at all. Do not take double or extra doses. Instead, continue with your next scheduled dose and check with your doctor. What may interact with this medicine? -antacids with aluminum and/or magnesium -folic acid -leucovorin -medicines to increase blood counts like filgrastim, pegfilgrastim, sargramostim -phenytoin -vaccines -warfarin Talk to your doctor or health care professional before taking any of these medicines: -acetaminophen -aspirin -ibuprofen -ketoprofen -naproxen This list may not describe all possible interactions. Give your health care provider a list of all the medicines, herbs, non-prescription drugs, or dietary supplements you use. Also tell them if you smoke, drink alcohol, or use illegal drugs. Some items may interact with your medicine. What should I watch for while using this  medicine? Visit your doctor for checks on your progress. This drug may make you feel generally unwell. This is not uncommon, as chemotherapy can affect healthy cells as well as cancer cells. Report any side effects. Continue your course of treatment even though you feel ill unless your doctor tells you to stop. In some cases, you may be given additional medicines to help with side effects. Follow all directions for their use. Call your doctor or health care professional for advice if you get a fever, chills or sore throat, or other symptoms of a cold or flu. Do not treat yourself. This drug decreases your body's ability to fight infections. Try to avoid being around people who are sick. This medicine may  increase your risk to bruise or bleed. Call your doctor or health care professional if you notice any unusual bleeding. Be careful brushing and flossing your teeth or using a toothpick because you may get an infection or bleed more easily. If you have any dental work done, tell your dentist you are receiving this medicine. Avoid taking products that contain aspirin, acetaminophen, ibuprofen, naproxen, or ketoprofen unless instructed by your doctor. These medicines may hide a fever. Do not become pregnant while taking this medicine. Women should inform their doctor if they wish to become pregnant or think they might be pregnant. There is a potential for serious side effects to an unborn child. Talk to your health care professional or pharmacist for more information. Do not breast-feed an infant while taking this medicine. Men are advised not to father a child while taking this medicine. What side effects may I notice from receiving this medicine? Side effects that you should report to your doctor or health care professional as soon as possible: -allergic reactions like skin rash, itching or hives, swelling of the face, lips, or tongue -low blood counts - this medicine may decrease the number of white  blood cells, red blood cells and platelets. You may be at increased risk for infections and bleeding. -signs of infection - fever or chills, cough, sore throat, pain or difficulty passing urine -signs of decreased platelets or bleeding - bruising, pinpoint red spots on the skin, black, tarry stools, blood in the urine -signs of decreased red blood cells - unusually weak or tired, fainting spells, lightheadedness -breathing problems -changes in vision -chest pain -dark urine -diarrhea of more than 4 bowel movements in one day or any diarrhea at night; bloody or watery diarrhea -dizziness -mouth sores -nausea and vomiting -pain, swelling, redness at site where injected -pain, tingling, numbness in the hands or feet -redness, swelling, or sores on hands or feet -stomach pain -vomiting -yellow color of skin or eyes Side effects that usually do not require medical attention (report to your doctor or health care professional if they continue or are bothersome): -constipation -diarrhea -dry or itchy skin -hair loss -loss of appetite -nausea -weak or tired This list may not describe all possible side effects. Call your doctor for medical advice about side effects. You may report side effects to FDA at 1-800-FDA-1088. Where should I keep my medicine? Keep out of the reach of children. Store at room temperature between 15 and 30 degrees C (59 and 86 degrees F). Keep container tightly closed. Throw away any unused medicine after the expiration date. NOTE: This sheet is a summary. It may not cover all possible information. If you have questions about this medicine, talk to your doctor, pharmacist, or health care provider.  2015, Elsevier/Gold Standard. (2012-11-05 12:47:46) Ondansetron tablets What is this medicine? ONDANSETRON (on DAN se tron) is used to treat nausea and vomiting caused by chemotherapy. It is also used to prevent or treat nausea and vomiting after surgery. This medicine may  be used for other purposes; ask your health care provider or pharmacist if you have questions. COMMON BRAND NAME(S): Zofran What should I tell my health care provider before I take this medicine? They need to know if you have any of these conditions: -heart disease -history of irregular heartbeat -liver disease -low levels of magnesium or potassium in the blood -an unusual or allergic reaction to ondansetron, granisetron, other medicines, foods, dyes, or preservatives -pregnant or trying to get pregnant -breast-feeding How should I use  this medicine? Take this medicine by mouth with a glass of water. Follow the directions on your prescription label. Take your doses at regular intervals. Do not take your medicine more often than directed. Talk to your pediatrician regarding the use of this medicine in children. Special care may be needed. Overdosage: If you think you have taken too much of this medicine contact a poison control center or emergency room at once. NOTE: This medicine is only for you. Do not share this medicine with others. What if I miss a dose? If you miss a dose, take it as soon as you can. If it is almost time for your next dose, take only that dose. Do not take double or extra doses. What may interact with this medicine? Do not take this medicine with any of the following medications: -apomorphine -certain medicines for fungal infections like fluconazole, itraconazole, ketoconazole, posaconazole, voriconazole -cisapride -dofetilide -dronedarone -pimozide -thioridazine -ziprasidone This medicine may also interact with the following medications: -carbamazepine -certain medicines for depression, anxiety, or psychotic disturbances -fentanyl -linezolid -MAOIs like Carbex, Eldepryl, Marplan, Nardil, and Parnate -methylene blue (injected into a vein) -other medicines that prolong the QT interval (cause an abnormal heart rhythm) -phenytoin -rifampicin -tramadol This  list may not describe all possible interactions. Give your health care provider a list of all the medicines, herbs, non-prescription drugs, or dietary supplements you use. Also tell them if you smoke, drink alcohol, or use illegal drugs. Some items may interact with your medicine. What should I watch for while using this medicine? Check with your doctor or health care professional right away if you have any sign of an allergic reaction. What side effects may I notice from receiving this medicine? Side effects that you should report to your doctor or health care professional as soon as possible: -allergic reactions like skin rash, itching or hives, swelling of the face, lips or tongue -breathing problems -confusion -dizziness -fast or irregular heartbeat -feeling faint or lightheaded, falls -fever and chills -loss of balance or coordination -seizures -sweating -swelling of the hands or feet -tightness in the chest -tremors -unusually weak or tired Side effects that usually do not require medical attention (report to your doctor or health care professional if they continue or are bothersome): -constipation or diarrhea -headache This list may not describe all possible side effects. Call your doctor for medical advice about side effects. You may report side effects to FDA at 1-800-FDA-1088. Where should I keep my medicine? Keep out of the reach of children. Store between 2 and 30 degrees C (36 and 86 degrees F). Throw away any unused medicine after the expiration date. NOTE: This sheet is a summary. It may not cover all possible information. If you have questions about this medicine, talk to your doctor, pharmacist, or health care provider.  2015, Elsevier/Gold Standard. (2012-10-06 16:27:45)

## 2014-05-03 ENCOUNTER — Encounter (HOSPITAL_BASED_OUTPATIENT_CLINIC_OR_DEPARTMENT_OTHER): Payer: 59

## 2014-05-03 DIAGNOSIS — C7802 Secondary malignant neoplasm of left lung: Secondary | ICD-10-CM

## 2014-05-03 DIAGNOSIS — C099 Malignant neoplasm of tonsil, unspecified: Secondary | ICD-10-CM

## 2014-05-03 DIAGNOSIS — C7801 Secondary malignant neoplasm of right lung: Secondary | ICD-10-CM

## 2014-05-03 NOTE — Addendum Note (Signed)
Addended by: Gerhard Perches on: 05/03/2014 04:52 PM   Modules accepted: Orders

## 2014-05-03 NOTE — Progress Notes (Signed)
Chemo teaching done and consent signed for Xeloda. Distress screening done. Xeloda to start 05/04/14 am. Calendar given to patient.

## 2014-05-04 ENCOUNTER — Other Ambulatory Visit (HOSPITAL_COMMUNITY): Payer: Self-pay | Admitting: *Deleted

## 2014-05-04 DIAGNOSIS — C099 Malignant neoplasm of tonsil, unspecified: Secondary | ICD-10-CM

## 2014-05-04 MED ORDER — MAGIC MOUTHWASH: ORAL | Status: DC

## 2014-05-08 ENCOUNTER — Other Ambulatory Visit (HOSPITAL_COMMUNITY): Payer: Self-pay | Admitting: Oncology

## 2014-05-08 DIAGNOSIS — C099 Malignant neoplasm of tonsil, unspecified: Secondary | ICD-10-CM

## 2014-05-08 MED ORDER — MAGIC MOUTHWASH: ORAL | Status: DC

## 2014-05-09 ENCOUNTER — Encounter: Payer: Self-pay | Admitting: *Deleted

## 2014-05-09 ENCOUNTER — Telehealth (HOSPITAL_COMMUNITY): Payer: Self-pay | Admitting: *Deleted

## 2014-05-09 NOTE — Progress Notes (Signed)
[facility] Psychosocial Distress Screening Clinical Social Work  Clinical Social Work was referred by distress screening protocol.  The patient scored a 9 on the Psychosocial Distress Thermometer which indicates severe distress. Clinical Social Worker phoned pt to assess for distress and other psychosocial needs. CSW spoke with wife over the phone, as pt was asleep. She shared he stated he was "scared and worried". She shared she feels [name] has "no quality of life right now". They plan to bring these concerns up to MD later this week. In addition, wife shared they have had some family stressors as well. CSW provided supportive listening and guidance.   [facility] DISTRESS SCREENING 05/03/2014  Screening Type   Distress experienced in past week (1-10) 9  Emotional problem type Adjusting to illness  Information Concerns Type   Physical Problem type   Physician notified of physical symptoms   Referral to clinical social work   Other     Clinical Social Worker follow up needed: Yes.    If yes, follow up plan: CSW to follow and continue to support family.  [name], [place] Tuesdays 8:30-1pm Wednesdays 8:30-12pm  [phone]

## 2014-05-09 NOTE — Telephone Encounter (Signed)
Pt's last dose of Xeloda was Tuesday 4/26 am due to extreme fatigue. Dr. Whitney Muse told me to have patient hold the drug. Pt's wife said she was definitely going to hold it. Pt to be seen on Thursday 05/11/14 for labs and MD visit.

## 2014-05-09 NOTE — Telephone Encounter (Signed)
Pt's wife states that the Xeloda is "putting him to bed". "It is taking the strength out of him". He is "staying in bed until 4 or 5 o'clock". Pt got mildly nauseated last pm and patient's wife gave him Zofran. That improved his symptoms of nausea. Patient started Xeloda on Thursday 05/04/14. Patient started getting fatigued on Sunday. No problems with hands, feet, diarrhea, mouth sores. Appetite is out of whack. He might not eat much during day but at night he wakes up and is hungry.

## 2014-05-10 ENCOUNTER — Other Ambulatory Visit (HOSPITAL_COMMUNITY): Payer: Self-pay

## 2014-05-11 ENCOUNTER — Encounter (HOSPITAL_COMMUNITY): Payer: 59

## 2014-05-11 ENCOUNTER — Encounter (HOSPITAL_COMMUNITY): Payer: Self-pay | Admitting: Hematology & Oncology

## 2014-05-11 ENCOUNTER — Encounter (HOSPITAL_BASED_OUTPATIENT_CLINIC_OR_DEPARTMENT_OTHER): Payer: 59 | Admitting: Hematology & Oncology

## 2014-05-11 ENCOUNTER — Other Ambulatory Visit (HOSPITAL_COMMUNITY): Payer: Self-pay

## 2014-05-11 ENCOUNTER — Ambulatory Visit (HOSPITAL_COMMUNITY): Payer: Self-pay | Admitting: Hematology & Oncology

## 2014-05-11 VITALS — BP 95/59 | HR 69 | Temp 97.6°F | Resp 18

## 2014-05-11 DIAGNOSIS — C099 Malignant neoplasm of tonsil, unspecified: Secondary | ICD-10-CM

## 2014-05-11 DIAGNOSIS — C7801 Secondary malignant neoplasm of right lung: Secondary | ICD-10-CM

## 2014-05-11 DIAGNOSIS — B977 Papillomavirus as the cause of diseases classified elsewhere: Secondary | ICD-10-CM

## 2014-05-11 DIAGNOSIS — C7802 Secondary malignant neoplasm of left lung: Secondary | ICD-10-CM

## 2014-05-11 LAB — TSH: TSH: 0.302 u[IU]/mL — AB (ref 0.350–4.500)

## 2014-05-11 LAB — COMPREHENSIVE METABOLIC PANEL
ALK PHOS: 61 U/L (ref 39–117)
ALT: 10 U/L (ref 0–53)
AST: 20 U/L (ref 0–37)
Albumin: 3.7 g/dL (ref 3.5–5.2)
Anion gap: 5 (ref 5–15)
BUN: 18 mg/dL (ref 6–23)
CO2: 32 mmol/L (ref 19–32)
Calcium: 8.9 mg/dL (ref 8.4–10.5)
Chloride: 104 mmol/L (ref 96–112)
Creatinine, Ser: 0.58 mg/dL (ref 0.50–1.35)
GFR calc Af Amer: >90 mL/min (ref >90)
Glucose, Bld: 89 mg/dL (ref 70–99)
POTASSIUM: 4.2 mmol/L (ref 3.5–5.1)
Sodium: 141 mmol/L (ref 135–145)
Total Bilirubin: 0.6 mg/dL (ref 0.3–1.2)
Total Protein: 7.1 g/dL (ref 6.0–8.3)

## 2014-05-11 LAB — CBC WITH DIFFERENTIAL/PLATELET
BASOS PCT: 1 % (ref 0–1)
Basophils Absolute: 0 10*3/uL (ref 0.0–0.1)
EOS ABS: 0.1 10*3/uL (ref 0.0–0.7)
Eosinophils Relative: 2 % (ref 0–5)
HCT: 32.9 % — ABNORMAL LOW (ref 39.0–52.0)
Hemoglobin: 10.8 g/dL — ABNORMAL LOW (ref 13.0–17.0)
Lymphocytes Relative: 12 % (ref 12–46)
Lymphs Abs: 0.4 10*3/uL — ABNORMAL LOW (ref 0.7–4.0)
MCH: 32.1 pg (ref 26.0–34.0)
MCHC: 32.8 g/dL (ref 30.0–36.0)
MCV: 97.9 fL (ref 78.0–100.0)
MONO ABS: 0.5 10*3/uL (ref 0.1–1.0)
Monocytes Relative: 12 % (ref 3–12)
Neutro Abs: 2.6 10*3/uL (ref 1.7–7.7)
Neutrophils Relative %: 73 % (ref 43–77)
Platelets: 113 10*3/uL — ABNORMAL LOW (ref 150–400)
RBC: 3.36 MIL/uL — AB (ref 4.22–5.81)
RDW: 17.6 % — ABNORMAL HIGH (ref 11.5–15.5)
Smear Review: DECREASED
WBC: 3.6 10*3/uL — AB (ref 4.0–10.5)

## 2014-05-11 MED ORDER — SODIUM CHLORIDE 0.9 % IJ SOLN
10.0000 mL | INTRAMUSCULAR | Status: DC | PRN
  Administered 2014-05-11: 10 mL via INTRAVENOUS
  Filled 2014-05-11: qty 10

## 2014-05-11 MED ORDER — HEPARIN SOD (PORK) LOCK FLUSH 100 UNIT/ML IV SOLN
500.0000 [IU] | Freq: Once | INTRAVENOUS | Status: AC
  Administered 2014-05-11: 500 [IU] via INTRAVENOUS

## 2014-05-11 NOTE — Progress Notes (Signed)
Please see doctors encounter for more information 

## 2014-05-11 NOTE — Progress Notes (Signed)
[facility] Progress Note  Patient Care Team: Sharilyn Sites, MD as PCP - General (Family Medicine) Leota Sauers, RN as Registered Nurse (Oncology) Heath Lark, MD as Consulting Physician (Hematology and Oncology) Eppie Gibson, MD as Attending Physician (Radiation Oncology) Patrici Ranks, MD as Consulting Physician (Internal Medicine)  Diagnosis: Squamous Cell Carcinoma of the right tonsil, p16+, T3N2cM0, Stage IVA  Right tonsil and bilateral neck / 70 Gy in 35 fractions to gross disease, 63 Gy in 35 fractions to high risk nodal echelons, and 56 Gy in 35 fractions to intermediate risk nodal echelons 05/02/2013-06/22/2013   Oncology History   Tonsil cancer, HPV positive   Primary site: Pharynx - Oropharynx (Right)   Staging method: AJCC 7th Edition   Clinical free text: HPV positive   Clinical: Stage IVA (T3, N2c, M0) signed by Heath Lark, MD on 04/20/2013  9:29 PM   Summary: Stage IVA (T3, N2c, M0)       Tonsil cancer   02/01/2013 Imaging Ultrasound of the neck revealed bilateral lymphadenopathy in the submandibular region   03/16/2013 Imaging CT scan of the neck show large right tonsil mass measured 3.9 cm in maximum dimension as well as bilateral lymphadenopathy, the largest lymph node measures 31 mm. There is also a left thyroid mass measured 44 mm   03/21/2013 Procedure The patient was seen by ENT with laryngoscopy and biopsy. Pathology is pending   03/25/2013 Imaging PET scan showed large hypermetabolic soft tissue mass in the region of the right palatine tonsil with bilateral cervical hypermetabolic lymphadenopathy indicative of metastatic disease,   04/04/2013 Surgery He underwent placement of Port-A-Cath and feeding tube   04/14/2013 Procedure The patient underwent teeth extraction.   05/02/2013 - 06/13/2013 Chemotherapy The dosage of chemotherapy was interrupted many times due to side effects. His treatment is terminated early due to severe ulcerated skin toxicity.   05/02/2013 - 06/22/2013 Radiation Therapy The patient completed radiation treatment.   10/18/2013 Imaging PET CT scan showed complete response in the oropharynx. However, he has evidence of new bilateral pulmonary metastasis.   12/16/2013 Imaging Repeat PET CT scan show significant progression of pulmonary metastases    01/25/2014 Pathology Results Squamous Cell Carcinoma, CT guided biopsy of RUL nodule   02/08/2014 - 04/20/2014 Chemotherapy Cisplatin/Taxol weekly x 8   04/17/2014 Imaging PET Interval improvement in size and hypermetabolism of the patient's multiple bilateral pulmonary metastases. No evidence for abnormal FDG uptake in the tongue base on the current study.Stable enlargement of the left thyroid lobe.    04/25/2014 -  Chemotherapy Xeloda 2000 mg in AM and 1500 mg in PM 7 days on and 7 days off    HISTORY OF PRESENTING ILLNESS:  [name] 64 y.o. 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   He started taking XELODA on Thursday, 4/21. On 'Sunday, 4/24, he had no appetite. He also states he felt so weak he could not move. He stopped his medication. Denies mouth sores. He feels better today. He took 4 tablets in the morning and 3 tablets in the evening.   His wife reports that they have been told that XELODA does not work in lung cancer.   MEDICAL HISTORY:  Past Medical History  Diagnosis Date  . Chest pain     10'$ /14  . Urethral stricture     s/p dilitation  . Neuropathy     compression neuropathy right hip;s/p replacement  . Allergy   .  Fibromyalgia   . Anxiety   . Depression   . Constipation   . Arthritis     knees, HIps, Hands  . Tonsillar cancer   . Type II diabetes mellitus   . Pneumonia 2012  . Complication of anesthesia     bleeding during intubation 04/04/13 due to friability of right tonsillar cancer  . PEG (percutaneous endoscopic gastrostomy) status   . S/P radiation therapy 05/02/2013-06/22/2013    70 [facility] - Squamous Cell Carcinoma  of the tonsil, p16+, T3N2cM0    SURGICAL HISTORY: Past Surgical History  Procedure Laterality Date  . Total hip arthroplasty Right 2000    Dr. Percell Miller  . Cystoscopy      Dr. Karsten Ro  . Portacath placement Right 04/04/2013  . Gastrostomy tube placement  04/04/2013  . Portacath placement N/A 04/04/2013    Procedure: INSERTION PORT-A-CATH;  Surgeon: Ralene Ok, MD;  Location: [facility];  Service: General;  Laterality: N/A;  . Laparoscopic gastrostomy N/A 04/04/2013    Procedure: LAPAROSCOPIC GASTROSTOMY TUBE PLACEMENT ;  Surgeon: Ralene Ok, MD;  Location: [facility];  Service: General;  Laterality: N/A;  . Nasal hemorrhage control N/A 04/04/2013    Procedure: Control of oropharyngeal hemorrhage;  Surgeon: Ascencion Dike, MD;  [facility];  Service: ENT;  Laterality: N/A;  . Multiple extractions with alveoloplasty N/A 04/14/2013    Procedure: Extraction of tooth #'s 1,2,3,4,5,6,7,8,9,10,11,12,13,14,15,17,18,19,20,21,22,23,24,25,26,27,28,29, 30, 31, and 32 with alveoloplasty and bilateral mandibular tori reductions.;  Surgeon: Lenn Cal, DDS;  Location: [facility];  Service: Oral Surgery;  Laterality: N/A;    SOCIAL HISTORY: History   Social History  . Marital Status: Married    Spouse Name: N/A  . Number of Children: N/A  . Years of Education: N/A   Occupational History  . Not on file.   Social History Main Topics  . Smoking status: Never Smoker   . Smokeless tobacco: Never Used  . Alcohol Use: No     Comment: Occasional beer  . Drug Use: No  . Sexual Activity: Yes   Other Topics Concern  . Not on file   Social History Narrative  Married for 20 years. They have no children. They have a dog. He has a daughter from a prior relationship, they are estranged. No smoking, worked at [employer]. He has never smoked. Occasional alcohol.  FAMILY HISTORY: Family History  Problem Relation Age of Onset  . Hyperlipidemia Brother   . Cancer Mother     Deceased with leukemia, had  uterine ca  . Cancer Cousin     living brain cancer, male   indicated that his mother is deceased. He indicated that his father is deceased. He indicated that his cousin is alive.   His mother had cervical cancer and leukemia. 1 brother who is healthy.  ALLERGIES:  is allergic to neurontin and dilaudid.  MEDICATIONS:  Current Outpatient Prescriptions  Medication Sig Dispense Refill  . acetaminophen (TYLENOL) 500 MG tablet Take 500-1,000 mg by mouth every 4 (four) hours as needed for mild pain.     [name] alprazolam (XANAX) 2 MG tablet Take 2 mg by mouth 3 (three) times daily as needed for sleep or anxiety.     . capecitabine (XELODA) 500 MG tablet Take 4 tablets in the am and 3 tablets in the pm for 7 days on then 7 days off then restart 98 tablet 3  . Cholecalciferol (VITAMIN D3) 5000 UNITS CAPS Take 5,000 Units by mouth daily.    [name]  citalopram (CELEXA) 20 MG tablet Take 1 tablet (20 mg total) by mouth daily. 30 tablet 1  . diphenhydrAMINE (BENADRYL) 25 mg capsule Take 1 capsule (25 mg total) by mouth every 6 (six) hours as needed for itching. 60 capsule 0  . docusate sodium (COLACE) 100 MG capsule Take 100 mg by mouth 2 (two) times daily as needed for mild constipation.    . lidocaine-prilocaine (EMLA) cream Apply a quarter size amount to port site 1 hour prior to chemo. Do not rub in. Cover with plastic wrap. 30 g 3  . nystatin (MYCOSTATIN) 100000 UNIT/ML suspension     . ondansetron (ZOFRAN) 8 MG tablet Take 1 tablet (8 mg total) by mouth every 8 (eight) hours as needed for nausea. 60 tablet 3  . OxyCODONE (OXYCONTIN) 10 mg T12A 12 hr tablet Take 1 tablet (10 mg total) by mouth every 12 (twelve) hours. 60 tablet 0  . oxyCODONE-acetaminophen (PERCOCET) 10-325 MG per tablet Take 1 tablet by mouth every 4 (four) hours as needed for pain. 30 tablet 0  . polyethylene glycol (MIRALAX / GLYCOLAX) packet Take 17 g by mouth as needed.    . pregabalin (LYRICA) 150 MG capsule Take 2 capsules (300 mg  total) by mouth 2 (two) times daily. (Patient taking differently: Take 300 mg by mouth 2 (two) times daily. 2 in am and 2 at night) 60 capsule 3  . promethazine (PHENERGAN) 25 MG tablet Take 1 tablet (25 mg total) by mouth every 6 (six) hours as needed for nausea. 60 tablet 3  . zolpidem (AMBIEN CR) 6.25 MG CR tablet Take 6.25 mg by mouth at bedtime as needed for sleep.     No current facility-administered medications for this visit.    Review of Systems  Constitutional: Negative for fever, chills, weight loss and  Positive for fatigue.  HENT: Negative for congestion, hearing loss, nosebleeds, sore throat and tinnitus.   Eyes: Negative for blurred vision, double vision, pain and discharge.  Respiratory: Negative for cough, hemoptysis, sputum production, shortness of breath and wheezing.   Cardiovascular: Negative for chest pain, palpitations, claudication, leg swelling and PND.  Gastrointestinal: Negative for heartburn, nausea, vomiting, abdominal pain, diarrhea, constipation, blood in stool and melena.  Genitourinary: Negative for dysuria, urgency, frequency and hematuria.  Musculoskeletal: Negative for myalgias, joint pain and falls.  Skin: Negative for itching and rash.  Neurological: Negative for dizziness, tingling, tremors, sensory change, speech change, focal weakness, seizures, loss of consciousness, weakness and headaches.  Endo/Heme/Allergies: Does not bruise/bleed easily.  Psychiatric/Behavioral: Negative for depression, suicidal ideas, memory loss and substance abuse. The patient is not nervous/anxious and does not have insomnia.     PHYSICAL EXAMINATION:  ECOG PERFORMANCE STATUS: 0 - Asymptomatic  Filed Vitals:   05/11/14 1039  BP: 95/59  Pulse: 69  Temp: 97.6 F (36.4 C)  Resp: 18   There were no vitals filed for this visit.   Physical Exam  Constitutional: He is oriented to person, place, and time and well-developed, well-nourished, and in no distress. He looks  good. HENT:  Head: Normocephalic and atraumatic.  Nose: Nose normal.  Mouth/Throat: Oropharynx is clear and moist. No oropharyngeal exudate.  XRT changes to the neck, mild edema noted  Eyes: Conjunctivae and EOM are normal. Pupils are equal, round, and reactive to light. Right eye exhibits no discharge. Left eye exhibits no discharge. No scleral icterus.  Neck: Normal range of motion. Neck supple. No tracheal deviation present. No thyromegaly present.  Cardiovascular: Normal  rate, regular rhythm and normal heart sounds.  Exam reveals no gallop and no friction rub.   No murmur heard. Pulmonary/Chest: [place] normal and breath sounds normal. He has no wheezes. He has no rales.  Abdominal: Soft. Bowel sounds are normal. He exhibits no distension and no mass. There is no tenderness. There is no rebound and no guarding.  Musculoskeletal: Normal range of motion. He exhibits no edema.  Lymphadenopathy:    He has no cervical adenopathy.  Neurological: He is alert and oriented to person, place, and time. He has normal reflexes. No cranial nerve deficit. Gait normal. Coordination normal.  Skin: Skin is warm and dry. No rash noted.  Psychiatric: Mood, memory, affect and judgment normal.  Nursing note and vitals reviewed.    LABORATORY DATA:  I have reviewed the data as listed Lab Results  Component Value Date   WBC 4.4 04/20/2014   HGB 10.6* 04/20/2014   HCT 32.0* 04/20/2014   MCV 94.7 04/20/2014   PLT 90* 04/20/2014     Chemistry      Component Value Date/Time   NA 137 04/20/2014 0920   NA 142 12/16/2013 1305   K 4.0 04/20/2014 0920   K 4.7 12/16/2013 1305   CL 102 04/20/2014 0920   CO2 29 04/20/2014 0920   CO2 32* 12/16/2013 1305   BUN 19 04/20/2014 0920   BUN 16.6 12/16/2013 1305   CREATININE 0.65 04/20/2014 0920   CREATININE 0.8 12/16/2013 1305      Component Value Date/Time   CALCIUM 8.8 04/20/2014 0920   CALCIUM 9.9 12/16/2013 1305   ALKPHOS 66 04/20/2014 0920   ALKPHOS  84 12/16/2013 1305   AST 17 04/20/2014 0920   AST 20 12/16/2013 1305   ALT 11 04/20/2014 0920   ALT 13 12/16/2013 1305   BILITOT 0.3 04/20/2014 0920   BILITOT 0.45 12/16/2013 1305      ASSESSMENT & PLAN:  Stage IV squamous cell carcinoma of the head and neck Fatigue  64 year old male with 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 He completed therapy with Cisplatin and Taxol given weekly. He had an excellent response. His performance status is a 0-1.   I have advised the patient and his wife that XELODA is active in head and neck cancer. I advised them that he has head and neck cancer that has spread to his lung.  He will hold his XELODA for now and follow up in 3 weeks. I advised them what the goals of therapy with XELODA are; ie. Trying to maintain his response to initiial treatment. We will regroup at his follow-up in 3 weeks. TSH will be checked again at that visit.  All questions were answered. The patient knows to call the clinic with any problems, questions or concerns.  This document serves as a record of services personally performed by Ancil Linsey, MD. It was created on her behalf by Pearlie Oyster, a trained medical scribe. The creation of this record is based on the scribe's personal observations and the provider's statements to them. This document has been checked and approved by the attending provider.     I have reviewed the above documentation for accuracy and completeness and I agree with the above. Molli Hazard, MD

## 2014-05-11 NOTE — Progress Notes (Signed)
Joyce Copa Chisenhall presented for Portacath access and flush.  Portacath located right chest wall accessed with  H 20 needle.  Good blood return present. Portacath flushed with 68m NS and 500U/59mHeparin and needle removed intact.  Procedure tolerated well and without incident.

## 2014-05-11 NOTE — Patient Instructions (Signed)
[place] at [facility] Discharge Instructions  RECOMMENDATIONS MADE BY THE CONSULTANT AND ANY TEST RESULTS WILL BE SENT TO YOUR REFERRING PHYSICIAN.  Return as scheduled in 3 weeks. Call the clinic if you have any questions or concerns prior to your next appointment.  Thank you for choosing [place] at [facility] to provide your oncology and hematology care.  To afford each patient quality time with our provider, please arrive at least 15 minutes before your scheduled appointment time.    You need to re-schedule your appointment should you arrive 10 or more minutes late.  We strive to give you quality time with our providers, and arriving late affects you and other patients whose appointments are after yours.  Also, if you no show three or more times for appointments you may be dismissed from the clinic at the providers discretion.     Again, thank you for choosing [facility]  Our hope is that these requests will decrease the amount of time that you wait before being seen by our physicians.       _____________________________________________________________  Should you have questions after your visit to [facility], please contact our office at [phone] between the hours of 8:30 a.m. and 4:30 p.m.  Voicemails left after 4:30 p.m. will not be returned until the following business day.  For prescription refill requests, have your pharmacy contact our office.

## 2014-06-02 ENCOUNTER — Other Ambulatory Visit (HOSPITAL_COMMUNITY): Payer: Self-pay

## 2014-06-02 ENCOUNTER — Ambulatory Visit (HOSPITAL_COMMUNITY): Payer: Self-pay | Admitting: Oncology

## 2014-06-02 NOTE — Assessment & Plan Note (Deleted)
Stage IV HPV+, squamous cell carcinoma of the right tonsil, initially treated suboptimally with Erbitux and Radiation (Cisplatin remains standard of care in fit patients) by Dr. Heath Lark at [facility].  Progression of disease noted and at that time, his care was transferred to [facility].  He was then treated with 8 cycles of Cisplatin/Taxol weekly with an excellent response on PET imaging.  Due to toxicities and side effects of Cisplatin/Taxol, and response to therapy, his treatment was transitioned to Xeloda 2000 mg in AM and 1500 mg in PM, 7 days on and 7 days off.  Unfortunately, he developed side effects to this regimen in a short amount of time and therefore, he was recently given a break in therapy (he declined a drug holiday following Cisplatin/Taxol in April 2016).

## 2014-06-02 NOTE — Progress Notes (Signed)
-  Rescheduled-  Elzie Knisley  

## 2014-06-08 ENCOUNTER — Encounter (HOSPITAL_COMMUNITY): Payer: 59 | Attending: Hematology & Oncology

## 2014-06-08 ENCOUNTER — Encounter (HOSPITAL_COMMUNITY): Payer: 59 | Attending: Hematology & Oncology | Admitting: Oncology

## 2014-06-08 VITALS — BP 97/60 | HR 74 | Temp 98.3°F | Resp 18 | Wt 207.6 lb

## 2014-06-08 DIAGNOSIS — C7802 Secondary malignant neoplasm of left lung: Secondary | ICD-10-CM | POA: Diagnosis not present

## 2014-06-08 DIAGNOSIS — Z9221 Personal history of antineoplastic chemotherapy: Secondary | ICD-10-CM | POA: Diagnosis not present

## 2014-06-08 DIAGNOSIS — Z95828 Presence of other vascular implants and grafts: Secondary | ICD-10-CM

## 2014-06-08 DIAGNOSIS — Z923 Personal history of irradiation: Secondary | ICD-10-CM | POA: Diagnosis not present

## 2014-06-08 DIAGNOSIS — C7801 Secondary malignant neoplasm of right lung: Secondary | ICD-10-CM | POA: Insufficient documentation

## 2014-06-08 DIAGNOSIS — C099 Malignant neoplasm of tonsil, unspecified: Secondary | ICD-10-CM | POA: Diagnosis present

## 2014-06-08 DIAGNOSIS — Z9889 Other specified postprocedural states: Secondary | ICD-10-CM | POA: Diagnosis not present

## 2014-06-08 LAB — CBC WITH DIFFERENTIAL/PLATELET
Basophils Absolute: 0 10*3/uL (ref 0.0–0.1)
Basophils Relative: 1 % (ref 0–1)
Eosinophils Absolute: 0.1 10*3/uL (ref 0.0–0.7)
Eosinophils Relative: 2 % (ref 0–5)
HCT: 33 % — ABNORMAL LOW (ref 39.0–52.0)
Hemoglobin: 10.7 g/dL — ABNORMAL LOW (ref 13.0–17.0)
Lymphocytes Relative: 9 % — ABNORMAL LOW (ref 12–46)
Lymphs Abs: 0.5 10*3/uL — ABNORMAL LOW (ref 0.7–4.0)
MCH: 32 pg (ref 26.0–34.0)
MCHC: 32.4 g/dL (ref 30.0–36.0)
MCV: 98.8 fL (ref 78.0–100.0)
MONO ABS: 0.4 10*3/uL (ref 0.1–1.0)
MONOS PCT: 6 % (ref 3–12)
Neutro Abs: 5.3 10*3/uL (ref 1.7–7.7)
Neutrophils Relative %: 83 % — ABNORMAL HIGH (ref 43–77)
Platelets: 107 10*3/uL — ABNORMAL LOW (ref 150–400)
RBC: 3.34 MIL/uL — ABNORMAL LOW (ref 4.22–5.81)
RDW: 14.9 % (ref 11.5–15.5)
Smear Review: DECREASED
WBC: 6.3 10*3/uL (ref 4.0–10.5)

## 2014-06-08 LAB — COMPREHENSIVE METABOLIC PANEL
ALK PHOS: 71 U/L (ref 38–126)
ALT: 10 U/L — ABNORMAL LOW (ref 17–63)
ANION GAP: 8 (ref 5–15)
AST: 18 U/L (ref 15–41)
Albumin: 3.8 g/dL (ref 3.5–5.0)
BUN: 14 mg/dL (ref 6–20)
CO2: 32 mmol/L (ref 22–32)
Calcium: 9 mg/dL (ref 8.9–10.3)
Chloride: 98 mmol/L — ABNORMAL LOW (ref 101–111)
Creatinine, Ser: 0.63 mg/dL (ref 0.61–1.24)
Glucose, Bld: 98 mg/dL (ref 65–99)
Potassium: 4.1 mmol/L (ref 3.5–5.1)
SODIUM: 138 mmol/L (ref 135–145)
Total Bilirubin: 0.7 mg/dL (ref 0.3–1.2)
Total Protein: 7.3 g/dL (ref 6.5–8.1)

## 2014-06-08 LAB — TSH: TSH: 0.102 u[IU]/mL — ABNORMAL LOW (ref 0.350–4.500)

## 2014-06-08 MED ORDER — HEPARIN SOD (PORK) LOCK FLUSH 100 UNIT/ML IV SOLN
INTRAVENOUS | Status: AC
  Filled 2014-06-08: qty 5

## 2014-06-08 MED ORDER — HEPARIN SOD (PORK) LOCK FLUSH 100 UNIT/ML IV SOLN
500.0000 [IU] | Freq: Once | INTRAVENOUS | Status: AC
  Administered 2014-06-08: 500 [IU] via INTRAVENOUS

## 2014-06-08 MED ORDER — SODIUM CHLORIDE 0.9 % IJ SOLN
10.0000 mL | Freq: Once | INTRAMUSCULAR | Status: AC
  Administered 2014-06-08: 10 mL via INTRAVENOUS

## 2014-06-08 NOTE — Progress Notes (Signed)
[address], [place] [address]  Tonsil cancer - Plan: CBC with Differential, Comprehensive metabolic panel, TSH, CBC with Differential, Comprehensive metabolic panel, TSH  Port catheter in place - Plan: sodium chloride 0.9 % injection 10 mL, heparin lock flush 100 unit/mL  CURRENT THERAPY: Xeloda 2000 mg in AM and 1500 mg in PM, 7 days on and 7 days off from.  INTERVAL HISTORY: [place] 64 y.o. male returns for followup of Squamous Cell Carcinoma of the right tonsil, p16+, T3N2cM0, Stage IVA.  Oncology History   Tonsil cancer, HPV positive   Primary site: Pharynx - Oropharynx (Right)   Staging method: AJCC 7th Edition   Clinical free text: HPV positive   Clinical: Stage IVA (T3, N2c, M0) signed by Heath Lark, MD on 04/20/2013  9:29 PM   Summary: Stage IVA (T3, N2c, M0)       Tonsil cancer   02/01/2013 Imaging Ultrasound of the neck revealed bilateral lymphadenopathy in the submandibular region   03/16/2013 Imaging CT scan of the neck show large right tonsil mass measured 3.9 cm in maximum dimension as well as bilateral lymphadenopathy, the largest lymph node measures 31 mm. There is also a left thyroid mass measured 44 mm   03/21/2013 Procedure The patient was seen by ENT with laryngoscopy and biopsy. Pathology is pending   03/25/2013 Imaging PET scan showed large hypermetabolic soft tissue mass in the region of the right palatine tonsil with bilateral cervical hypermetabolic lymphadenopathy indicative of metastatic disease,   04/04/2013 Surgery He underwent placement of Port-A-Cath and feeding tube   04/14/2013 Procedure The patient underwent teeth extraction.   05/02/2013 - 06/13/2013 Chemotherapy The dosage of chemotherapy was interrupted many times due to side effects. His treatment is terminated early due to severe ulcerated skin toxicity.   05/02/2013 - 06/22/2013 Radiation Therapy Right tonsil and bilateral neck / 70 Gy in 35 fractions to  gross disease, 63 Gy in 35 fractions to high risk nodal echelons, and 56 Gy in 35 fractions to intermediate risk nodal echelons 05/02/2013-06/22/2013    10/18/2013 Imaging PET CT scan showed complete response in the oropharynx. However, he has evidence of new bilateral pulmonary metastasis.   12/16/2013 Imaging Repeat PET CT scan show significant progression of pulmonary metastases    01/25/2014 Pathology Results Squamous Cell Carcinoma, CT guided biopsy of RUL nodule   02/08/2014 - 04/20/2014 Chemotherapy Cisplatin/Taxol weekly x 8   04/17/2014 Imaging PET Interval improvement in size and hypermetabolism of the patient's multiple bilateral pulmonary metastases. No evidence for abnormal FDG uptake in the tongue base on the current study.Stable enlargement of the left thyroid lobe.    05/04/2014 - 05/11/2014 Chemotherapy Xeloda 2000 mg in AM and 1500 mg in PM 7 days on and 7 days off   05/11/2014 Adverse Reaction Decreased appetite and increased weakness.   I personally reviewed and went over laboratory results with the patient.  The results are noted within this dictation.  The patient's wife is very defensive and abrasive at the beginning of our visit.  She reports that she loves her husband and she does not want to answer the questions I pose.  "Well, don't you have the answer to that [pointing to the computer]?"  I excused myself and provided every factual information regarding to the patient chemo treatment with dates, which answered my simple question of "you've been off Xeloda for about 3-4 weeks right?"  The patient is reminded  that he is incurable and this was upsetting to the wife.  "No one has ever told us that before?"  Tiago reports that he was very tired and fatigued while on Xeloda.  He is at a reduced dose to start with.  The patient's wife paints a poor picture of Kayden's tolerance to Xeloda when he took it for 4-5 days out of the 7 day prescribed course.   With this information, I recommended we  not restart Xeloda at this time.  A continued break from therapy would be reasonable given his extent of disease.    Past Medical History  Diagnosis Date  . Chest pain     10/14  . Urethral stricture     s/p dilitation  . Neuropathy     compression neuropathy right hip;s/p replacement  . Allergy   . Fibromyalgia   . Anxiety   . Depression   . Constipation   . Arthritis     knees, HIps, Hands  . Tonsillar cancer   . Type II diabetes mellitus   . Pneumonia 2012  . Complication of anesthesia     bleeding during intubation 04/04/13 due to friability of right tonsillar cancer  . PEG (percutaneous endoscopic gastrostomy) status   . S/P radiation therapy 05/02/2013-06/22/2013    70 [facility] - Squamous Cell Carcinoma of the tonsil, p16+, T3N2cM0    has Diabetes mellitus, type 2; Other and unspecified hyperlipidemia; Obesity, unspecified; Tonsil cancer; Diabetic neuropathy, painful; Gastrostomy in place; Gastrostomy tube dyslodgement - replaced 05/06/2013; Mucositis (ulcerative) due to antineoplastic therapy; Protein-calorie malnutrition, severe; Rash; and Metastasis to lung on his problem list.     is allergic to neurontin and dilaudid.  Current Outpatient Prescriptions on File Prior to Visit  Medication Sig Dispense Refill  . acetaminophen (TYLENOL) 500 MG tablet Take 500-1,000 mg by mouth every 4 (four) hours as needed for mild pain.     [name] alprazolam (XANAX) 2 MG tablet Take 2 mg by mouth 3 (three) times daily as needed for sleep or anxiety.     . Cholecalciferol (VITAMIN D3) 5000 UNITS CAPS Take 5,000 Units by mouth daily.    . citalopram (CELEXA) 20 MG tablet Take 1 tablet (20 mg total) by mouth daily. 30 tablet 1  . diphenhydrAMINE (BENADRYL) 25 mg capsule Take 1 capsule (25 mg total) by mouth every 6 (six) hours as needed for itching. 60 capsule 0  . docusate sodium (COLACE) 100 MG capsule Take 100 mg by mouth 2 (two) times daily as needed for mild constipation.    .  lidocaine-prilocaine (EMLA) cream Apply a quarter size amount to port site 1 hour prior to chemo. Do not rub in. Cover with plastic wrap. 30 g 3  . nystatin (MYCOSTATIN) 100000 UNIT/ML suspension Take 5 mLs by mouth as needed.     . OxyCODONE (OXYCONTIN) 10 mg T12A 12 hr tablet Take 1 tablet (10 mg total) by mouth every 12 (twelve) hours. 60 tablet 0  . oxyCODONE-acetaminophen (PERCOCET) 10-325 MG per tablet Take 1 tablet by mouth every 4 (four) hours as needed for pain. 30 tablet 0  . polyethylene glycol (MIRALAX / GLYCOLAX) packet Take 17 g by mouth as needed.    . pregabalin (LYRICA) 150 MG capsule Take 2 capsules (300 mg total) by mouth 2 (two) times daily. (Patient taking differently: Take 300 mg by mouth 2 (two) times daily. 2 in am and 2 at night) 60 capsule 3  . zolpidem (AMBIEN CR) 6.25 MG CR tablet  Take 6.25 mg by mouth at bedtime as needed for sleep.    . capecitabine (XELODA) 500 MG tablet Take 4 tablets in the am and 3 tablets in the pm for 7 days on then 7 days off then restart (Patient not taking: Reported on 06/08/2014) 98 tablet 3  . ondansetron (ZOFRAN) 8 MG tablet Take 1 tablet (8 mg total) by mouth every 8 (eight) hours as needed for nausea. (Patient not taking: Reported on 06/08/2014) 60 tablet 3  . promethazine (PHENERGAN) 25 MG tablet Take 1 tablet (25 mg total) by mouth every 6 (six) hours as needed for nausea. (Patient not taking: Reported on 06/08/2014) 60 tablet 3   No current facility-administered medications on file prior to visit.    Past Surgical History  Procedure Laterality Date  . Total hip arthroplasty Right 2000    Dr. Percell Miller  . Cystoscopy      Dr. Karsten Ro  . Portacath placement Right 04/04/2013  . Gastrostomy tube placement  04/04/2013  . Portacath placement N/A 04/04/2013    Procedure: INSERTION PORT-A-CATH;  Surgeon: Ralene Ok, MD;  Location: [facility];  Service: General;  Laterality: N/A;  . Laparoscopic gastrostomy N/A 04/04/2013    Procedure: LAPAROSCOPIC  GASTROSTOMY TUBE PLACEMENT ;  Surgeon: Ralene Ok, MD;  Location: [facility];  Service: General;  Laterality: N/A;  . Nasal hemorrhage control N/A 04/04/2013    Procedure: Control of oropharyngeal hemorrhage;  Surgeon: Ascencion Dike, MD;  Location: [facility] OR;  Service: ENT;  Laterality: N/A;  . Multiple extractions with alveoloplasty N/A 04/14/2013    Procedure: Extraction of tooth #'s 1,2,3,4,5,6,7,8,9,10,11,12,13,14,15,17,18,19,20,21,22,23,24,25,26,27,28,29, 30, 31, and 32 with alveoloplasty and bilateral mandibular tori reductions.;  Surgeon: Lenn Cal, DDS;  Location: [place];  Service: Oral Surgery;  Laterality: N/A;    Denies any headaches, dizziness, double vision, fevers, chills, night sweats, nausea, vomiting, diarrhea, constipation, chest pain, heart palpitations, shortness of breath, blood in stool, black tarry stool, urinary pain, urinary burning, urinary frequency, hematuria.   PHYSICAL EXAMINATION  ECOG PERFORMANCE STATUS: 1 - Symptomatic but completely ambulatory  Filed Vitals:   06/08/14 1100  BP: 97/60  Pulse: 74  Temp: 98.3 F (36.8 C)  Resp: 18    GENERAL:alert, no distress, comfortable, cooperative, smiling and accompanied by his wife. SKIN: skin color, texture, turgor are normal, no rashes or significant lesions HEAD: Normocephalic, No masses, lesions, tenderness or abnormalities EYES: normal, PERRLA, EOMI, Conjunctiva are pink and non-injected EARS: External ears normal OROPHARYNX:lips, buccal mucosa, and tongue normal and mucous membranes are moist  NECK: supple, trachea midline LYMPH:  not examined BREAST:not examined LUNGS: clear to auscultation  HEART: regular rate & rhythm ABDOMEN:abdomen soft and normal bowel sounds BACK: Back symmetric, no curvature. EXTREMITIES:less then 2 second capillary refill, no joint deformities, effusion, or inflammation, no cyanosis  NEURO: alert & oriented x 3 with fluent speech, no focal motor/sensory deficits, gait  normal   LABORATORY DATA: CBC    Component Value Date/Time   WBC 6.3 06/08/2014 1205   WBC 5.6 12/16/2013 1304   RBC 3.34* 06/08/2014 1205   RBC 4.66 12/16/2013 1304   HGB 10.7* 06/08/2014 1205   HGB 13.9 12/16/2013 1304   HCT 33.0* 06/08/2014 1205   HCT 42.4 12/16/2013 1304   PLT 107* 06/08/2014 1205   PLT 167 12/16/2013 1304   MCV 98.8 06/08/2014 1205   MCV 91.0 12/16/2013 1304   MCH 32.0 06/08/2014 1205   MCH 29.8 12/16/2013 1304   MCHC 32.4 06/08/2014 1205  MCHC 32.8 12/16/2013 1304   RDW 14.9 06/08/2014 1205   RDW 15.3* 12/16/2013 1304   LYMPHSABS 0.5* 06/08/2014 1205   LYMPHSABS 0.6* 12/16/2013 1304   MONOABS 0.4 06/08/2014 1205   MONOABS 0.6 12/16/2013 1304   EOSABS 0.1 06/08/2014 1205   EOSABS 0.2 12/16/2013 1304   BASOSABS 0.0 06/08/2014 1205   BASOSABS 0.1 12/16/2013 1304      Chemistry      Component Value Date/Time   NA 138 06/08/2014 1205   NA 142 12/16/2013 1305   K 4.1 06/08/2014 1205   K 4.7 12/16/2013 1305   CL 98* 06/08/2014 1205   CO2 32 06/08/2014 1205   CO2 32* 12/16/2013 1305   BUN 14 06/08/2014 1205   BUN 16.6 12/16/2013 1305   CREATININE 0.63 06/08/2014 1205   CREATININE 0.8 12/16/2013 1305      Component Value Date/Time   CALCIUM 9.0 06/08/2014 1205   CALCIUM 9.9 12/16/2013 1305   ALKPHOS 71 06/08/2014 1205   ALKPHOS 84 12/16/2013 1305   AST 18 06/08/2014 1205   AST 20 12/16/2013 1305   ALT 10* 06/08/2014 1205   ALT 13 12/16/2013 1305   BILITOT 0.7 06/08/2014 1205   BILITOT 0.45 12/16/2013 1305         ASSESSMENT AND PLAN:  Tonsil cancer Stage IV HPV+, squamous cell carcinoma of the right tonsil, initially treated suboptimally with Erbitux and Radiation (Cisplatin remains standard of care in fit patients) by Dr. Heath Lark at [facility].  Progression of disease noted and at that time, his care was transferred to [facility].  He was then treated with 8 cycles of Cisplatin/Taxol weekly with an excellent response on PET imaging.   Due to toxicities and side effects of Cisplatin/Taxol, and response to therapy, his treatment was transitioned to Xeloda 2000 mg in AM and 1500 mg in PM, 7 days on and 7 days off.  Unfortunately, he developed side effects to this regimen in a short amount of time and therefore, he was recently given a break in therapy (he declined a drug holiday following Cisplatin/Taxol in April 2016).    Oncology history updated.  I have recommended he continue to hold Xeloda for the time being due to poor tolerability.  I have encouraged PO fluid intake increase.  Return in 3-4 weeks for follow-up.    THERAPY PLAN:  Continue to hold Xeloda for the time being.  All questions were answered. The patient knows to call the clinic with any problems, questions or concerns. We can certainly see the patient much sooner if necessary.  Patient and plan discussed with Dr. Ancil Linsey and she is in agreement with the aforementioned.   This note is electronically signed by: Doy Mince 06/08/2014 6:19 PM

## 2014-06-08 NOTE — Progress Notes (Signed)
[name] Below presented for Portacath access and flush. Proper placement of portacath confirmed by CXR. Portacath located right chest wall accessed with  H 20 needle. Good blood return present. Portacath flushed with 68m NS and 500U/579mHeparin and needle removed intact. Procedure without incident. Patient tolerated procedure well.

## 2014-06-08 NOTE — Progress Notes (Signed)
Please see doctors encounter for more information 

## 2014-06-08 NOTE — Assessment & Plan Note (Addendum)
Stage IV HPV+, squamous cell carcinoma of the right tonsil, initially treated suboptimally with Erbitux and Radiation (Cisplatin remains standard of care in fit patients) by Dr. Heath Lark at [facility].  Progression of disease noted and at that time, his care was transferred to [facility].  He was then treated with 8 cycles of Cisplatin/Taxol weekly with an excellent response on PET imaging.  Due to toxicities and side effects of Cisplatin/Taxol, and response to therapy, his treatment was transitioned to Xeloda 2000 mg in AM and 1500 mg in PM, 7 days on and 7 days off.  Unfortunately, he developed side effects to this regimen in a short amount of time and therefore, he was recently given a break in therapy (he declined a drug holiday following Cisplatin/Taxol in April 2016).    Oncology history updated.  I have recommended he continue to hold Xeloda for the time being due to poor tolerability.  I have encouraged PO fluid intake increase.  Return in 3-4 weeks for follow-up.

## 2014-06-08 NOTE — Patient Instructions (Signed)
[place] at [facility] Discharge Instructions  RECOMMENDATIONS MADE BY THE CONSULTANT AND ANY TEST RESULTS WILL BE SENT TO YOUR REFERRING PHYSICIAN.  Exam and discussion by Robynn Pane, PA-C Continue to stay off the xeloda  Call with concerns or issues  Follow-up in 3 - 4 weeks.  Thank you for choosing [place] at [facility] to provide your oncology and hematology care.  To afford each patient quality time with our provider, please arrive at least 15 minutes before your scheduled appointment time.    You need to re-schedule your appointment should you arrive 10 or more minutes late.  We strive to give you quality time with our providers, and arriving late affects you and other patients whose appointments are after yours.  Also, if you no show three or more times for appointments you may be dismissed from the clinic at the providers discretion.     Again, thank you for choosing [facility].  Our hope is that these requests will decrease the amount of time that you wait before being seen by our physicians.       _____________________________________________________________  Should you have questions after your visit to [facility], please contact our office at [phone] between the hours of 8:30 a.m. and 4:30 p.m.  Voicemails left after 4:30 p.m. will not be returned until the following business day.  For prescription refill requests, have your pharmacy contact our office.

## 2014-07-03 ENCOUNTER — Encounter (HOSPITAL_COMMUNITY): Payer: Self-pay | Admitting: Hematology & Oncology

## 2014-07-03 ENCOUNTER — Encounter (HOSPITAL_COMMUNITY): Payer: 59 | Attending: Hematology & Oncology | Admitting: Hematology & Oncology

## 2014-07-03 VITALS — BP 118/79 | HR 61 | Temp 97.4°F | Resp 20 | Wt 208.4 lb

## 2014-07-03 DIAGNOSIS — Z923 Personal history of irradiation: Secondary | ICD-10-CM | POA: Insufficient documentation

## 2014-07-03 DIAGNOSIS — C7800 Secondary malignant neoplasm of unspecified lung: Secondary | ICD-10-CM

## 2014-07-03 DIAGNOSIS — C7802 Secondary malignant neoplasm of left lung: Secondary | ICD-10-CM | POA: Diagnosis not present

## 2014-07-03 DIAGNOSIS — C099 Malignant neoplasm of tonsil, unspecified: Secondary | ICD-10-CM | POA: Diagnosis not present

## 2014-07-03 DIAGNOSIS — Z9221 Personal history of antineoplastic chemotherapy: Secondary | ICD-10-CM | POA: Insufficient documentation

## 2014-07-03 DIAGNOSIS — C7801 Secondary malignant neoplasm of right lung: Secondary | ICD-10-CM | POA: Insufficient documentation

## 2014-07-03 DIAGNOSIS — B977 Papillomavirus as the cause of diseases classified elsewhere: Secondary | ICD-10-CM

## 2014-07-03 NOTE — Progress Notes (Signed)
[place] Progress Note  Patient Care Team: Sharilyn Sites, MD as PCP - General (Family Medicine) Leota Sauers, RN as Registered Nurse (Oncology) Heath Lark, MD as Consulting Physician (Hematology and Oncology) Eppie Gibson, MD as Attending Physician (Radiation Oncology) Patrici Ranks, MD as Consulting Physician (Internal Medicine)  Diagnosis: Squamous Cell Carcinoma of the right tonsil, p16+, T3N2cM0, Stage IVA  Right tonsil and bilateral neck / 70 Gy in 35 fractions to gross disease, 63 Gy in 35 fractions to high risk nodal echelons, and 56 Gy in 35 fractions to intermediate risk nodal echelons 05/02/2013-06/22/2013   Oncology History   Tonsil cancer, HPV positive   Primary site: Pharynx - Oropharynx (Right)   Staging method: AJCC 7th Edition   Clinical free text: HPV positive   Clinical: Stage IVA (T3, N2c, M0) signed by Heath Lark, MD on 04/20/2013  9:29 PM   Summary: Stage IVA (T3, N2c, M0)       Tonsil cancer   02/01/2013 Imaging Ultrasound of the neck revealed bilateral lymphadenopathy in the submandibular region   03/16/2013 Imaging CT scan of the neck show large right tonsil mass measured 3.9 cm in maximum dimension as well as bilateral lymphadenopathy, the largest lymph node measures 31 mm. There is also a left thyroid mass measured 44 mm   03/21/2013 Procedure The patient was seen by ENT with laryngoscopy and biopsy. Pathology is pending   03/25/2013 Imaging PET scan showed large hypermetabolic soft tissue mass in the region of the right palatine tonsil with bilateral cervical hypermetabolic lymphadenopathy indicative of metastatic disease,   04/04/2013 Surgery He underwent placement of Port-A-Cath and feeding tube   04/14/2013 Procedure The patient underwent teeth extraction.   05/02/2013 - 06/13/2013 Chemotherapy The dosage of chemotherapy was interrupted many times due to side effects. His treatment is terminated early due to severe ulcerated skin toxicity.    05/02/2013 - 06/22/2013 Radiation Therapy Right tonsil and bilateral neck / 70 Gy in 35 fractions to gross disease, 63 Gy in 35 fractions to high risk nodal echelons, and 56 Gy in 35 fractions to intermediate risk nodal echelons 05/02/2013-06/22/2013    10/18/2013 Imaging PET CT scan showed complete response in the oropharynx. However, he has evidence of new bilateral pulmonary metastasis.   12/16/2013 Imaging Repeat PET CT scan show significant progression of pulmonary metastases    01/25/2014 Pathology Results Squamous Cell Carcinoma, CT guided biopsy of RUL nodule   02/08/2014 - 04/20/2014 Chemotherapy Cisplatin/Taxol weekly x 8   04/17/2014 Imaging PET Interval improvement in size and hypermetabolism of the patient's multiple bilateral pulmonary metastases. No evidence for abnormal FDG uptake in the tongue base on the current study.Stable enlargement of the left thyroid lobe.    05/04/2014 - 05/11/2014 Chemotherapy Xeloda 2000 mg in AM and 1500 mg in PM 7 days on and 7 days off   05/11/2014 Adverse Reaction Decreased appetite and increased weakness.    HISTORY OF PRESENTING ILLNESS:  [name] 64 y.o. 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   He is present today with his wife and says he is feeling a lot better.  He has been extremely active around the house fixing things and mowing the lawn.  He also drives and rides to the store and around town.  This excites him because he does not like to sit inside and he says that he still want to find a hobby. His wife says that he  has been eating; his weight has greatly improved  He says that he can taste his food and is sleeping. His weight is up. He does not like his dentures, his wife says that they plan on scheduling an appointment to have implants to help "lock them in"  He wants to see his PET scan "pictures."  MEDICAL HISTORY:  Past Medical History  Diagnosis Date  . Chest pain     10/14  . Urethral stricture      s/p dilitation  . Neuropathy     compression neuropathy right hip;s/p replacement  . Allergy   . Fibromyalgia   . Anxiety   . Depression   . Constipation   . Arthritis     knees, HIps, Hands  . Tonsillar cancer   . Type II diabetes mellitus   . Pneumonia 2012  . Complication of anesthesia     bleeding during intubation 04/04/13 due to friability of right tonsillar cancer  . PEG (percutaneous endoscopic gastrostomy) status   . S/P radiation therapy 05/02/2013-06/22/2013    70 [facility] - Squamous Cell Carcinoma of the tonsil, p16+, T3N2cM0    SURGICAL HISTORY: Past Surgical History  Procedure Laterality Date  . Total hip arthroplasty Right 2000    Dr. Percell Miller  . Cystoscopy      Dr. Karsten Ro  . Portacath placement Right 04/04/2013  . Gastrostomy tube placement  04/04/2013  . Portacath placement N/A 04/04/2013    Procedure: INSERTION PORT-A-CATH;  Surgeon: Ralene Ok, MD;  Location: [facility];  Service: General;  Laterality: N/A;  . Laparoscopic gastrostomy N/A 04/04/2013    Procedure: LAPAROSCOPIC GASTROSTOMY TUBE PLACEMENT ;  Surgeon: Ralene Ok, MD;  Location: [facility];  Service: General;  Laterality: N/A;  . Nasal hemorrhage control N/A 04/04/2013    Procedure: Control of oropharyngeal hemorrhage;  Surgeon: Ascencion Dike, MD;  Location: [facility] OR;  Service: ENT;  Laterality: N/A;  . Multiple extractions with alveoloplasty N/A 04/14/2013    Procedure: Extraction of tooth #'s 1,2,3,4,5,6,7,8,9,10,11,12,13,14,15,17,18,19,20,21,22,23,24,25,26,27,28,29, 30, 31, and 32 with alveoloplasty and bilateral mandibular tori reductions.;  Surgeon: Lenn Cal, DDS;  Location: [facility];  Service: Oral Surgery;  Laterality: N/A;    SOCIAL HISTORY: History   Social History  . Marital Status: Married    Spouse Name: N/A  . Number of Children: N/A  . Years of Education: N/A   Occupational History  . Not on file.   Social History Main Topics  . Smoking status: Never Smoker   . Smokeless tobacco:  Never Used  . Alcohol Use: No     Comment: Occasional beer  . Drug Use: No  . Sexual Activity: Yes   Other Topics Concern  . Not on file   Social History Narrative  Married for 20 years. They have no children. They have a dog. He has a daughter from a prior relationship, they are estranged. No smoking, worked at [employer]. He has never smoked. Occasional alcohol.  FAMILY HISTORY: Family History  Problem Relation Age of Onset  . Hyperlipidemia Brother   . Cancer Mother     Deceased with leukemia, had uterine ca  . Cancer Cousin     living brain cancer, male   indicated that his mother is deceased. He indicated that his father is deceased. He indicated that his cousin is alive.   His mother had cervical cancer and leukemia. 1 brother who is healthy.  ALLERGIES:  is allergic to neurontin and dilaudid.  MEDICATIONS:  Current Outpatient  Prescriptions  Medication Sig Dispense Refill  . alprazolam (XANAX) 2 MG tablet Take 2 mg by mouth 3 (three) times daily as needed for sleep or anxiety.     . citalopram (CELEXA) 20 MG tablet Take 1 tablet (20 mg total) by mouth daily. 30 tablet 1  . diphenhydrAMINE (BENADRYL) 25 mg capsule Take 1 capsule (25 mg total) by mouth every 6 (six) hours as needed for itching. 60 capsule 0  . docusate sodium (COLACE) 100 MG capsule Take 100 mg by mouth 2 (two) times daily as needed for mild constipation.    . lidocaine-prilocaine (EMLA) cream Apply a quarter size amount to port site 1 hour prior to chemo. Do not rub in. Cover with plastic wrap. 30 g 3  . nystatin (MYCOSTATIN) 100000 UNIT/ML suspension Take 5 mLs by mouth as needed.     . OxyCODONE (OXYCONTIN) 10 mg T12A 12 hr tablet Take 1 tablet (10 mg total) by mouth every 12 (twelve) hours. 60 tablet 0  . oxyCODONE-acetaminophen (PERCOCET) 10-325 MG per tablet Take 1 tablet by mouth every 4 (four) hours as needed for pain. 30 tablet 0  . polyethylene glycol (MIRALAX / GLYCOLAX) packet Take 17 g by  mouth as needed.    . pregabalin (LYRICA) 150 MG capsule Take 2 capsules (300 mg total) by mouth 2 (two) times daily. (Patient taking differently: Take 300 mg by mouth 2 (two) times daily. 2 in am and 2 at night) 60 capsule 3  . promethazine (PHENERGAN) 25 MG tablet Take 1 tablet (25 mg total) by mouth every 6 (six) hours as needed for nausea. 60 tablet 3  . zolpidem (AMBIEN CR) 6.25 MG CR tablet Take 6.25 mg by mouth at bedtime as needed for sleep.    [name] acetaminophen (TYLENOL) 500 MG tablet Take 500-1,000 mg by mouth every 4 (four) hours as needed for mild pain.     . capecitabine (XELODA) 500 MG tablet Take 4 tablets in the am and 3 tablets in the pm for 7 days on then 7 days off then restart (Patient not taking: Reported on 06/08/2014) 98 tablet 3  . Cholecalciferol (VITAMIN D3) 5000 UNITS CAPS Take 5,000 Units by mouth daily.    . ondansetron (ZOFRAN) 8 MG tablet Take 1 tablet (8 mg total) by mouth every 8 (eight) hours as needed for nausea. (Patient not taking: Reported on 06/08/2014) 60 tablet 3   No current facility-administered medications for this visit.    Review of Systems  Constitutional: Negative for fever, chills, weight loss and  Positive for fatigue.  HENT: Negative for congestion, hearing loss, nosebleeds, sore throat and tinnitus.   Eyes: Negative for blurred vision, double vision, pain and discharge.  Respiratory: Negative for cough, hemoptysis, sputum production, shortness of breath and wheezing.   Cardiovascular: Negative for chest pain, palpitations, claudication, leg swelling and PND.  Gastrointestinal: Negative for heartburn, nausea, vomiting, abdominal pain, diarrhea, constipation, blood in stool and melena.  Genitourinary: Negative for dysuria, urgency, frequency and hematuria.  Musculoskeletal: Negative for myalgias, joint pain and falls.  Skin: Negative for itching and rash.  Neurological: Negative for dizziness, tingling, tremors, sensory change, speech change, focal  weakness, seizures, loss of consciousness, weakness and headaches.  Endo/Heme/Allergies: Does not bruise/bleed easily.  Psychiatric/Behavioral: Negative for depression, suicidal ideas, memory loss and substance abuse. The patient is not nervous/anxious and does not have insomnia.     PHYSICAL EXAMINATION:  ECOG PERFORMANCE STATUS: 0 - Asymptomatic  Filed Vitals:   07/03/14 1048  BP: 118/79  Pulse: 61  Temp: 97.4 F (36.3 C)  Resp: 20   Filed Weights   07/03/14 1048  Weight: 208 lb 6.4 oz (94.53 kg)     Physical Exam  Constitutional: He is oriented to person, place, and time and well-developed, well-nourished, and in no distress. He looks good. HENT:  Head: Normocephalic and atraumatic.  Nose: Nose normal.  Mouth/Throat: Oropharynx is clear and moist. No oropharyngeal exudate.  XRT changes to the neck, mild edema noted  Eyes: Conjunctivae and EOM are normal. Pupils are equal, round, and reactive to light. Right eye exhibits no discharge. Left eye exhibits no discharge. No scleral icterus.  Neck: Normal range of motion. Neck supple. No tracheal deviation present. No thyromegaly present.  Cardiovascular: Normal rate, regular rhythm and normal heart sounds.  Exam reveals no gallop and no friction rub.   No murmur heard. Pulmonary/Chest: Effort normal and breath sounds normal. He has no wheezes. He has no rales.   Abdominal: Soft. Bowel sounds are normal. He exhibits no distension and no mass. There is no tenderness. There is no rebound and no guarding.   Musculoskeletal: Normal range of motion. He exhibits no edema.  Lymphadenopathy:    He has no cervical adenopathy.  Neurological: He is alert and oriented to person, place, and time. He has normal reflexes. No cranial nerve deficit. Gait normal. Coordination normal.  Skin: Skin is warm and dry. No rash noted.  Psychiatric: Mood, memory, affect and judgment normal.  Nursing note and vitals reviewed.   LABORATORY DATA:  I have  reviewed the data as listed Lab Results  Component Value Date   WBC 6.3 06/08/2014   HGB 10.7* 06/08/2014   HCT 33.0* 06/08/2014   MCV 98.8 06/08/2014   PLT 107* 06/08/2014     Chemistry      Component Value Date/Time   NA 138 06/08/2014 1205   NA 142 12/16/2013 1305   K 4.1 06/08/2014 1205   K 4.7 12/16/2013 1305   CL 98* 06/08/2014 1205   CO2 32 06/08/2014 1205   CO2 32* 12/16/2013 1305   BUN 14 06/08/2014 1205   BUN 16.6 12/16/2013 1305   CREATININE 0.63 06/08/2014 1205   CREATININE 0.8 12/16/2013 1305      Component Value Date/Time   CALCIUM 9.0 06/08/2014 1205   CALCIUM 9.9 12/16/2013 1305   ALKPHOS 71 06/08/2014 1205   ALKPHOS 84 12/16/2013 1305   AST 18 06/08/2014 1205   AST 20 12/16/2013 1305   ALT 10* 06/08/2014 1205   ALT 13 12/16/2013 1305   BILITOT 0.7 06/08/2014 1205   BILITOT 0.45 12/16/2013 1305      ASSESSMENT & PLAN:  Stage IV squamous cell carcinoma of the head and neck Fatigue  64 year old male with 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 He completed therapy with Cisplatin and Taxol given weekly. He had an excellent response. His performance status is a 0-1.   I reviewed his PET with he and his wife and discussed repeat imaging prior to his next appointment. He is doing very well and I have encouraged them to enjoy this time. We did discuss other therapies when/if needed.  All questions were answered. The patient knows to call the clinic with any problems, questions or concerns.  This document serves as a record of services personally performed by Ancil Linsey, MD. It was created on her behalf by Janace Hoard, a  trained medical scribe. The creation of this record is based on the scribe's personal observations and the provider's statements to them. This document has been checked and approved by the attending provider.     I have reviewed the above  documentation for accuracy and completeness and I agree with the above.  Kelby Fam. Whitney Muse, MD

## 2014-07-03 NOTE — Patient Instructions (Signed)
[name] at [facility] Discharge Instructions  RECOMMENDATIONS MADE BY THE CONSULTANT AND ANY TEST RESULTS WILL BE SENT TO YOUR REFERRING PHYSICIAN.  Exam completed by Dr Whitney Muse today PET scan ordered and scheduled. Follow up with the doctor after the PET scan. Please call the clinic if you have any questions or concerns.  Thank you for choosing [place] at [facility] to provide your oncology and hematology care.  To afford each patient quality time with our provider, please arrive at least 15 minutes before your scheduled appointment time.    You need to re-schedule your appointment should you arrive 10 or more minutes late.  We strive to give you quality time with our providers, and arriving late affects you and other patients whose appointments are after yours.  Also, if you no show three or more times for appointments you may be dismissed from the clinic at the providers discretion.     Again, thank you for choosing [facility].  Our hope is that these requests will decrease the amount of time that you wait before being seen by our physicians.       _____________________________________________________________  Should you have questions after your visit to [facility], please contact our office at [phone] between the hours of 8:30 a.m. and 4:30 p.m.  Voicemails left after 4:30 p.m. will not be returned until the following business day.  For prescription refill requests, have your pharmacy contact our office.

## 2014-07-04 ENCOUNTER — Encounter (HOSPITAL_COMMUNITY): Payer: Self-pay | Admitting: Hematology & Oncology

## 2014-07-20 ENCOUNTER — Encounter (HOSPITAL_COMMUNITY): Payer: 59

## 2014-07-24 ENCOUNTER — Ambulatory Visit (HOSPITAL_COMMUNITY)
Admission: RE | Admit: 2014-07-24 | Discharge: 2014-07-24 | Disposition: A | Discharge status: Home / Self Care | Payer: 59 | Source: Ambulatory Visit | Attending: Hematology & Oncology | Admitting: Hematology & Oncology

## 2014-07-24 DIAGNOSIS — N3289 Other specified disorders of bladder: Secondary | ICD-10-CM | POA: Diagnosis not present

## 2014-07-24 DIAGNOSIS — I2510 Atherosclerotic heart disease of native coronary artery without angina pectoris: Secondary | ICD-10-CM | POA: Insufficient documentation

## 2014-07-24 DIAGNOSIS — C099 Malignant neoplasm of tonsil, unspecified: Secondary | ICD-10-CM | POA: Diagnosis not present

## 2014-07-24 DIAGNOSIS — K4090 Unilateral inguinal hernia, without obstruction or gangrene, not specified as recurrent: Secondary | ICD-10-CM | POA: Insufficient documentation

## 2014-07-24 DIAGNOSIS — K449 Diaphragmatic hernia without obstruction or gangrene: Secondary | ICD-10-CM | POA: Diagnosis not present

## 2014-07-24 DIAGNOSIS — I517 Cardiomegaly: Secondary | ICD-10-CM | POA: Diagnosis not present

## 2014-07-24 DIAGNOSIS — E049 Nontoxic goiter, unspecified: Secondary | ICD-10-CM | POA: Insufficient documentation

## 2014-07-24 DIAGNOSIS — R918 Other nonspecific abnormal finding of lung field: Secondary | ICD-10-CM | POA: Insufficient documentation

## 2014-07-24 DIAGNOSIS — N400 Enlarged prostate without lower urinary tract symptoms: Secondary | ICD-10-CM | POA: Diagnosis not present

## 2014-07-24 DIAGNOSIS — C7800 Secondary malignant neoplasm of unspecified lung: Secondary | ICD-10-CM

## 2014-07-24 LAB — GLUCOSE, CAPILLARY: Glucose-Capillary: 88 mg/dL (ref 65–99)

## 2014-07-24 MED ORDER — FLUDEOXYGLUCOSE F - 18 (FDG) INJECTION
10.4000 | Freq: Once | INTRAVENOUS | Status: AC | PRN
  Administered 2014-07-24: 10.4 via INTRAVENOUS

## 2014-07-25 ENCOUNTER — Encounter (HOSPITAL_COMMUNITY): Payer: 59

## 2014-07-25 ENCOUNTER — Encounter (HOSPITAL_COMMUNITY): Payer: 59 | Attending: Hematology & Oncology | Admitting: Hematology & Oncology

## 2014-07-25 ENCOUNTER — Encounter (HOSPITAL_COMMUNITY): Payer: Self-pay | Admitting: Hematology & Oncology

## 2014-07-25 VITALS — BP 126/66 | HR 75 | Temp 98.0°F | Resp 18 | Wt 208.0 lb

## 2014-07-25 DIAGNOSIS — C7802 Secondary malignant neoplasm of left lung: Secondary | ICD-10-CM

## 2014-07-25 DIAGNOSIS — R05 Cough: Secondary | ICD-10-CM | POA: Diagnosis not present

## 2014-07-25 DIAGNOSIS — R5383 Other fatigue: Secondary | ICD-10-CM

## 2014-07-25 DIAGNOSIS — Z923 Personal history of irradiation: Secondary | ICD-10-CM | POA: Diagnosis not present

## 2014-07-25 DIAGNOSIS — Z9221 Personal history of antineoplastic chemotherapy: Secondary | ICD-10-CM | POA: Diagnosis not present

## 2014-07-25 DIAGNOSIS — C7801 Secondary malignant neoplasm of right lung: Secondary | ICD-10-CM | POA: Insufficient documentation

## 2014-07-25 DIAGNOSIS — C099 Malignant neoplasm of tonsil, unspecified: Secondary | ICD-10-CM | POA: Diagnosis present

## 2014-07-25 DIAGNOSIS — C7800 Secondary malignant neoplasm of unspecified lung: Secondary | ICD-10-CM

## 2014-07-25 LAB — TSH: TSH: 0.398 u[IU]/mL (ref 0.350–4.500)

## 2014-07-25 LAB — T4, FREE: Free T4: 0.78 ng/dL (ref 0.61–1.12)

## 2014-07-25 MED ORDER — HEPARIN SOD (PORK) LOCK FLUSH 100 UNIT/ML IV SOLN
500.0000 [IU] | Freq: Once | INTRAVENOUS | Status: AC
  Administered 2014-07-25: 500 [IU] via INTRAVENOUS

## 2014-07-25 MED ORDER — LEVOFLOXACIN 500 MG PO TABS: 500.0000 mg | ORAL_TABLET | Freq: Every day | ORAL | Status: DC

## 2014-07-25 MED ORDER — HEPARIN SOD (PORK) LOCK FLUSH 100 UNIT/ML IV SOLN
INTRAVENOUS | Status: AC
  Filled 2014-07-25: qty 5

## 2014-07-25 MED ORDER — SODIUM CHLORIDE 0.9 % IJ SOLN
10.0000 mL | INTRAMUSCULAR | Status: DC | PRN
  Administered 2014-07-25: 10 mL via INTRAVENOUS
  Filled 2014-07-25: qty 10

## 2014-07-25 NOTE — Progress Notes (Signed)
Joyce Copa Pellow presented for Portacath access and flush.  Portacath located right chest wall accessed with  H 20 needle.  Portacath flushed with 27m NS and 500U/567mHeparin and needle removed intact.  Procedure tolerated well and without incident.

## 2014-07-25 NOTE — Progress Notes (Signed)
[facility] Progress Note  Patient Care Team: Sharilyn Sites, MD as PCP - General (Family Medicine) Leota Sauers, RN as Registered Nurse (Oncology) Heath Lark, MD as Consulting Physician (Hematology and Oncology) Eppie Gibson, MD as Attending Physician (Radiation Oncology) Patrici Ranks, MD as Consulting Physician (Internal Medicine)  Diagnosis: Squamous Cell Carcinoma of the right tonsil, p16+, T3N2cM0, Stage IVA  Right tonsil and bilateral neck / 70 Gy in 35 fractions to gross disease, 63 Gy in 35 fractions to high risk nodal echelons, and 56 Gy in 35 fractions to intermediate risk nodal echelons 05/02/2013-06/22/2013   Oncology History   Tonsil cancer, HPV positive   Primary site: Pharynx - Oropharynx (Right)   Staging method: AJCC 7th Edition   Clinical free text: HPV positive   Clinical: Stage IVA (T3, N2c, M0) signed by Heath Lark, MD on 04/20/2013  9:29 PM   Summary: Stage IVA (T3, N2c, M0)       Tonsil cancer   02/01/2013 Imaging Ultrasound of the neck revealed bilateral lymphadenopathy in the submandibular region   03/16/2013 Imaging CT scan of the neck show large right tonsil mass measured 3.9 cm in maximum dimension as well as bilateral lymphadenopathy, the largest lymph node measures 31 mm. There is also a left thyroid mass measured 44 mm   03/21/2013 Procedure The patient was seen by ENT with laryngoscopy and biopsy. Pathology is pending   03/25/2013 Imaging PET scan showed large hypermetabolic soft tissue mass in the region of the right palatine tonsil with bilateral cervical hypermetabolic lymphadenopathy indicative of metastatic disease,   04/04/2013 Surgery He underwent placement of Port-A-Cath and feeding tube   04/14/2013 Procedure The patient underwent teeth extraction.   05/02/2013 - 06/13/2013 Chemotherapy The dosage of chemotherapy was interrupted many times due to side effects. His treatment is terminated early due to severe ulcerated skin toxicity.    05/02/2013 - 06/22/2013 Radiation Therapy Right tonsil and bilateral neck / 70 Gy in 35 fractions to gross disease, 63 Gy in 35 fractions to high risk nodal echelons, and 56 Gy in 35 fractions to intermediate risk nodal echelons 05/02/2013-06/22/2013    10/18/2013 Imaging PET CT scan showed complete response in the oropharynx. However, he has evidence of new bilateral pulmonary metastasis.   12/16/2013 Imaging Repeat PET CT scan show significant progression of pulmonary metastases    01/25/2014 Pathology Results Squamous Cell Carcinoma, CT guided biopsy of RUL nodule   02/08/2014 - 04/20/2014 Chemotherapy Cisplatin/Taxol weekly x 8   04/17/2014 Imaging PET Interval improvement in size and hypermetabolism of the patient's multiple bilateral pulmonary metastases. No evidence for abnormal FDG uptake in the tongue base on the current study.Stable enlargement of the left thyroid lobe.    05/04/2014 - 05/11/2014 Chemotherapy Xeloda 2000 mg in AM and 1500 mg in PM 7 days on and 7 days off   05/11/2014 Adverse Reaction Decreased appetite and increased weakness.    HISTORY OF PRESENTING ILLNESS:  [name] 64 y.o. 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   He is here today with his wife and doing okay. He has been working and doing whatever he wants to do. He recently bush hogged the yard. He would like to return to work, however disability does not allow him to do so. In lieu of working, he has been trying to do more outside. He has also considered volunteering with cancer patients.  His wife mentions that their neighbors  call concerned when he is outside due to his staggering. He admits that sometimes he does stagger, saying that his legs are weak but not bad. He does not feel unsteady or as though he is going to fall. His wife is also worried about him going outside in the summer heat.   His wife says that he has had a nonproductive cough. He believes it is just allergies. His  wife demonstrated how he sometimes coughs, initially panting and then coughing. He has not noticed a cough at night and states that his breathing is good. He complains of allergies and runny nose as well.  They have been back to Dr. Enrique Sack in regards to his dentures, however his wife says "they have done about as much as they can do".  MEDICAL HISTORY:  Past Medical History  Diagnosis Date  . Chest pain     10/14  . Urethral stricture     s/p dilitation  . Neuropathy     compression neuropathy right hip;s/p replacement  . Allergy   . Fibromyalgia   . Anxiety   . Depression   . Constipation   . Arthritis     knees, HIps, Hands  . Tonsillar cancer   . Type II diabetes mellitus   . Pneumonia 2012  . Complication of anesthesia     bleeding during intubation 04/04/13 due to friability of right tonsillar cancer  . PEG (percutaneous endoscopic gastrostomy) status   . S/P radiation therapy 05/02/2013-06/22/2013    70 [facility] - Squamous Cell Carcinoma of the tonsil, p16+, T3N2cM0    SURGICAL HISTORY: Past Surgical History  Procedure Laterality Date  . Total hip arthroplasty Right 2000    Dr. Percell Miller  . Cystoscopy      Dr. Karsten Ro  . Portacath placement Right 04/04/2013  . Gastrostomy tube placement  04/04/2013  . Portacath placement N/A 04/04/2013    Procedure: INSERTION PORT-A-CATH;  Surgeon: Ralene Ok, MD;  Location: [place];  Service: General;  Laterality: N/A;  . Laparoscopic gastrostomy N/A 04/04/2013    Procedure: LAPAROSCOPIC GASTROSTOMY TUBE PLACEMENT ;  Surgeon: Ralene Ok, MD;  Location: [place];  Service: General;  Laterality: N/A;  . Nasal hemorrhage control N/A 04/04/2013    Procedure: Control of oropharyngeal hemorrhage;  Surgeon: Ascencion Dike, MD;  Location: [facility];  Service: ENT;  Laterality: N/A;  . Multiple extractions with alveoloplasty N/A 04/14/2013    Procedure: Extraction of tooth #'s 1,2,3,4,5,6,7,8,9,10,11,12,13,14,15,17,18,19,20,21,22,23,24,25,26,27,28,29, 30,  31, and 32 with alveoloplasty and bilateral mandibular tori reductions.;  Surgeon: Lenn Cal, DDS;  Location: [place];  Service: Oral Surgery;  Laterality: N/A;    SOCIAL HISTORY: History   Social History  . Marital Status: Married    Spouse Name: N/A  . Number of Children: N/A  . Years of Education: N/A   Occupational History  . Not on file.   Social History Main Topics  . Smoking status: Never Smoker   . Smokeless tobacco: Never Used  . Alcohol Use: No     Comment: Occasional beer  . Drug Use: No  . Sexual Activity: Yes   Other Topics Concern  . Not on file   Social History Narrative  Married for 20 years. They have no children. They have a dog. He has a daughter from a prior relationship, they are estranged. No smoking, worked at [employer]. He has never smoked. Occasional alcohol.  FAMILY HISTORY: Family History  Problem Relation Age of Onset  . Hyperlipidemia Brother   .  Cancer Mother     Deceased with leukemia, had uterine ca  . Cancer Cousin     living brain cancer, male   indicated that his mother is deceased. He indicated that his father is deceased. He indicated that his cousin is alive.   His mother had cervical cancer and leukemia. 1 brother who is healthy.  ALLERGIES:  is allergic to neurontin and dilaudid.  MEDICATIONS:  Current Outpatient Prescriptions  Medication Sig Dispense Refill  . acetaminophen (TYLENOL) 500 MG tablet Take 500-1,000 mg by mouth every 4 (four) hours as needed for mild pain.     [name] alprazolam (XANAX) 2 MG tablet Take 2 mg by mouth 3 (three) times daily as needed for sleep or anxiety.     . Cholecalciferol (VITAMIN D3) 5000 UNITS CAPS Take 5,000 Units by mouth daily.    . citalopram (CELEXA) 20 MG tablet Take 1 tablet (20 mg total) by mouth daily. 30 tablet 1  . diphenhydrAMINE (BENADRYL) 25 mg capsule Take 1 capsule (25 mg total) by mouth every 6 (six) hours as needed for itching. 60 capsule 0  . docusate sodium  (COLACE) 100 MG capsule Take 100 mg by mouth 2 (two) times daily as needed for mild constipation.    . lidocaine-prilocaine (EMLA) cream Apply a quarter size amount to port site 1 hour prior to chemo. Do not rub in. Cover with plastic wrap. 30 g 3  . nystatin (MYCOSTATIN) 100000 UNIT/ML suspension Take 5 mLs by mouth as needed.     . ondansetron (ZOFRAN) 8 MG tablet Take 1 tablet (8 mg total) by mouth every 8 (eight) hours as needed for nausea. 60 tablet 3  . OxyCODONE (OXYCONTIN) 10 mg T12A 12 hr tablet Take 1 tablet (10 mg total) by mouth every 12 (twelve) hours. 60 tablet 0  . oxyCODONE-acetaminophen (PERCOCET) 10-325 MG per tablet Take 1 tablet by mouth every 4 (four) hours as needed for pain. 30 tablet 0  . polyethylene glycol (MIRALAX / GLYCOLAX) packet Take 17 g by mouth as needed.    . pregabalin (LYRICA) 150 MG capsule Take 2 capsules (300 mg total) by mouth 2 (two) times daily. (Patient taking differently: Take 300 mg by mouth 2 (two) times daily. 2 in am and 2 at night) 60 capsule 3  . promethazine (PHENERGAN) 25 MG tablet Take 1 tablet (25 mg total) by mouth every 6 (six) hours as needed for nausea. 60 tablet 3  . zolpidem (AMBIEN CR) 6.25 MG CR tablet Take 6.25 mg by mouth at bedtime as needed for sleep.    . capecitabine (XELODA) 500 MG tablet Take 4 tablets in the am and 3 tablets in the pm for 7 days on then 7 days off then restart (Patient not taking: Reported on 06/08/2014) 98 tablet 3  . levofloxacin (LEVAQUIN) 500 MG tablet Take 1 tablet (500 mg total) by mouth daily. 7 tablet 0   Current Facility-Administered Medications  Medication Dose Route Frequency Provider Last Rate Last Dose  . sodium chloride 0.9 % injection 10 mL  10 mL Intravenous PRN Patrici Ranks, MD   10 mL at 07/25/14 0930    Review of Systems  Constitutional: Negative for fever, chills, weight loss and  Positive for fatigue.  HENT: Negative for congestion, hearing loss, nosebleeds, sore throat and tinnitus.    Eyes: Negative for blurred vision, double vision, pain and discharge.  Respiratory: Positive for cough. Negative for hemoptysis, sputum production, shortness of breath and wheezing.  Non productive Cardiovascular: Negative for chest pain, palpitations, claudication, leg swelling and PND.  Gastrointestinal: Negative for heartburn, nausea, vomiting, abdominal pain, diarrhea, constipation, blood in stool and melena.  Genitourinary: Negative for dysuria, urgency, frequency and hematuria.  Musculoskeletal: Negative for myalgias, joint pain and falls.  Skin: Negative for itching and rash.  Neurological: Negative for dizziness, tingling, tremors, sensory change, speech change, focal weakness, seizures, loss of consciousness, weakness and headaches.  Endo/Heme/Allergies: Does not bruise/bleed easily.  Psychiatric/Behavioral: Negative for depression, suicidal ideas, memory loss and substance abuse. The patient is not nervous/anxious and does not have insomnia.   14 point review of systems was performed and is negative except as detailed under history of present illness and above   PHYSICAL EXAMINATION:  ECOG PERFORMANCE STATUS: 0 - Asymptomatic  Filed Vitals:   07/25/14 0917  BP: 126/66  Pulse: 75  Temp: 98 F (36.7 C)  Resp: 18   Filed Weights   07/25/14 0917  Weight: 208 lb (94.348 kg)     Physical Exam  Constitutional: He is oriented to person, place, and time and well-developed, well-nourished, and in no distress. He looks good. HENT:  Head: Normocephalic and atraumatic.  Nose: Nose normal.  Mouth/Throat: Oropharynx is clear and moist. No oropharyngeal exudate.  XRT changes to the neck, mild edema noted  Eyes: Conjunctivae and EOM are normal. Pupils are equal, round, and reactive to light. Right eye exhibits no discharge. Left eye exhibits no discharge. No scleral icterus.  Neck: Normal range of motion. Neck supple. No tracheal deviation present. No thyromegaly present.   Cardiovascular: Normal rate, regular rhythm and normal heart sounds.  Exam reveals no gallop and no friction rub.   No murmur heard. Pulmonary/Chest: Effort normal and breath sounds normal. He has no wheezes. He has no rales.   Abdominal: Soft. Bowel sounds are normal. He exhibits no distension and no mass. There is no tenderness. There is no rebound and no guarding.   Musculoskeletal: Normal range of motion. He exhibits no edema.  Lymphadenopathy:    He has no cervical adenopathy.  Neurological: He is alert and oriented to person, place, and time. He has normal reflexes. No cranial nerve deficit. Gait normal. Coordination normal.  Skin: Skin is warm and dry. No rash noted.  Psychiatric: Mood, memory, affect and judgment normal.  Nursing note and vitals reviewed.   LABORATORY DATA:  I have reviewed the data as listed Lab Results  Component Value Date   WBC 6.3 06/08/2014   HGB 10.7* 06/08/2014   HCT 33.0* 06/08/2014   MCV 98.8 06/08/2014   PLT 107* 06/08/2014     Chemistry      Component Value Date/Time   NA 138 06/08/2014 1205   NA 142 12/16/2013 1305   K 4.1 06/08/2014 1205   K 4.7 12/16/2013 1305   CL 98* 06/08/2014 1205   CO2 32 06/08/2014 1205   CO2 32* 12/16/2013 1305   BUN 14 06/08/2014 1205   BUN 16.6 12/16/2013 1305   CREATININE 0.63 06/08/2014 1205   CREATININE 0.8 12/16/2013 1305      Component Value Date/Time   CALCIUM 9.0 06/08/2014 1205   CALCIUM 9.9 12/16/2013 1305   ALKPHOS 71 06/08/2014 1205   ALKPHOS 84 12/16/2013 1305   AST 18 06/08/2014 1205   AST 20 12/16/2013 1305   ALT 10* 06/08/2014 1205   ALT 13 12/16/2013 1305   BILITOT 0.7 06/08/2014 1205   BILITOT 0.45 12/16/2013 1305     RADIOLOGY:  CLINICAL DATA: Subsequent treatment strategy for restaging of tonsillar cancer. Stage IV head neck primary.  EXAM: NUCLEAR MEDICINE PET SKULL BASE TO THIGH  TECHNIQUE: 10.4 mCi F-18 FDG was injected intravenously. Full-ring PET imaging was  performed from the skull base to thigh after the radiotracer. CT data was obtained and used for attenuation correction and anatomic localization.  FASTING BLOOD GLUCOSE: Value: 88 mg/dl  COMPARISON: 04/17/2014  FINDINGS: NECK  No head neck primary or nodal hypermetabolism identified.  CHEST  No thoracic nodal hypermetabolism. The previously described posterior right upper lobe pulmonary nodule is similar in size, measuring 9 mm. Measures a S.U.V. max of 1.4 today, versus a S.U.V. max of 2.1 on the prior. Patchy central lower lobe airspace opacity with hypermetabolism is new. No well-defined nodule or mass within. This measures on the order of a S.U.V. max of 3.7 on the right and a S.U.V. max of 4.2 on the left.  ABDOMEN/PELVIS  No areas of abnormal hypermetabolism.  SKELETON  No abnormal marrow activity.  CT IMAGES PERFORMED FOR ATTENUATION CORRECTION  Left-sided thyroid enlargement and heterogeneity, as before. No cervical adenopathy. A right Port-A-Cath which terminates at the high right atrium. Mild cardiomegaly with a small hiatal hernia. Biapical pleural parenchymal scarring. Re- demonstration of other smaller pulmonary nodules. Example at 6 mm in the anterior right lung base on image 41, similar. Multivessel coronary artery atherosclerosis. Normal adrenal glands. Moderate to marked bladder distension, chronic. Mild prostatomegaly. Fat containing left larger than right inguinal hernias. Degraded evaluation of the pelvis, secondary to beam hardening artifact from right hip arthroplasty.  IMPRESSION: 1. Similar size of scattered pulmonary nodules. The largest nodule demonstrates decreased hypermetabolism today. 2. Central lower lobe predominant bilateral airspace opacities with hypermetabolism. If the patient has had interval radiation therapy, it could have this appearance. Alternatively, considerations include infection or drug toxicity. 3. No  evidence of progressive hypermetabolic metastasis. 4. Atherosclerosis, including within the coronary arteries. 5. Prostatomegaly with significant bladder distension. Correlate with bladder outlet obstruction.   Electronically Signed  By: Abigail Miyamoto M.D.  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 He completed therapy with Cisplatin and Taxol given weekly. He had an excellent response. His performance status is a 0-1.   I reviewed his PET with he and his wife and discussed repeat imaging prior to his next appointment. He is doing very well and I have encouraged them to enjoy this time. We did discuss other therapies when/if needed.   Prescribed an antibiotic due to findings on PET and his complaint of URI/cough symptoms.  He was advised to call if symptoms progress.  He denies any significant urinary symptoms.  I advised the patient to notify us should he develop any problems with urination.  We will schedule for a routine follow up in 2 months post PET.  All questions were answered. The patient knows to call the clinic with any problems, questions or concerns.  This document serves as a record of services personally performed by Ancil Linsey, MD. It was created on her behalf by Arlyce Harman, a trained medical scribe. The creation of this record is based on the scribe's personal observations and the provider's statements to them. This document has been checked and approved by the attending provider.   I  have reviewed the above documentation for accuracy and completeness and I agree with the above.  Kelby Fam. Whitney Muse, MD

## 2014-07-25 NOTE — Progress Notes (Signed)
Please see doctors encounter for more information 

## 2014-07-25 NOTE — Patient Instructions (Addendum)
[place] at [facility] Discharge Instructions  RECOMMENDATIONS MADE BY THE CONSULTANT AND ANY TEST RESULTS WILL BE SENT TO YOUR REFERRING PHYSICIAN.  Exam and discussion today with Dr. Whitney Muse. Antibiotic prescribed. PET scan in 2 months. Return in 2 months.  Thank you for choosing [place] at [facility] to provide your oncology and hematology care.  To afford each patient quality time with our provider, please arrive at least 15 minutes before your scheduled appointment time.    You need to re-schedule your appointment should you arrive 10 or more minutes late.  We strive to give you quality time with our providers, and arriving late affects you and other patients whose appointments are after yours.  Also, if you no show three or more times for appointments you may be dismissed from the clinic at the providers discretion.     Again, thank you for choosing [facility].  Our hope is that these requests will decrease the amount of time that you wait before being seen by our physicians.       _____________________________________________________________  Should you have questions after your visit to [facility], please contact our office at [phone] between the hours of 8:30 a.m. and 4:30 p.m.  Voicemails left after 4:30 p.m. will not be returned until the following business day.  For prescription refill requests, have your pharmacy contact our office.

## 2014-08-31 ENCOUNTER — Encounter (HOSPITAL_COMMUNITY): Payer: 59

## 2014-09-25 ENCOUNTER — Ambulatory Visit (HOSPITAL_COMMUNITY)
Admission: RE | Admit: 2014-09-25 | Discharge: 2014-09-25 | Disposition: A | Discharge status: Home / Self Care | Payer: Medicare Other | Source: Ambulatory Visit | Attending: Hematology & Oncology | Admitting: Hematology & Oncology

## 2014-09-25 DIAGNOSIS — C099 Malignant neoplasm of tonsil, unspecified: Secondary | ICD-10-CM | POA: Diagnosis not present

## 2014-09-25 DIAGNOSIS — C7800 Secondary malignant neoplasm of unspecified lung: Secondary | ICD-10-CM

## 2014-09-25 LAB — GLUCOSE, CAPILLARY: GLUCOSE-CAPILLARY: 88 mg/dL (ref 65–99)

## 2014-09-25 MED ORDER — FLUDEOXYGLUCOSE F - 18 (FDG) INJECTION: 10.3700 | Freq: Once | INTRAVENOUS | Status: DC | PRN

## 2014-09-27 ENCOUNTER — Encounter (HOSPITAL_COMMUNITY): Payer: 59 | Attending: Hematology & Oncology | Admitting: Hematology & Oncology

## 2014-09-27 ENCOUNTER — Encounter (HOSPITAL_COMMUNITY): Payer: Self-pay | Admitting: Hematology & Oncology

## 2014-09-27 ENCOUNTER — Encounter (HOSPITAL_COMMUNITY): Payer: 59

## 2014-09-27 DIAGNOSIS — C7801 Secondary malignant neoplasm of right lung: Secondary | ICD-10-CM | POA: Diagnosis not present

## 2014-09-27 DIAGNOSIS — Z9221 Personal history of antineoplastic chemotherapy: Secondary | ICD-10-CM | POA: Insufficient documentation

## 2014-09-27 DIAGNOSIS — Z923 Personal history of irradiation: Secondary | ICD-10-CM | POA: Insufficient documentation

## 2014-09-27 DIAGNOSIS — C7802 Secondary malignant neoplasm of left lung: Secondary | ICD-10-CM | POA: Diagnosis not present

## 2014-09-27 DIAGNOSIS — C7800 Secondary malignant neoplasm of unspecified lung: Secondary | ICD-10-CM

## 2014-09-27 DIAGNOSIS — C099 Malignant neoplasm of tonsil, unspecified: Secondary | ICD-10-CM | POA: Diagnosis not present

## 2014-09-27 LAB — CBC WITH DIFFERENTIAL/PLATELET
Basophils Absolute: 0.1 10*3/uL (ref 0.0–0.1)
Basophils Relative: 1 %
EOS ABS: 0.2 10*3/uL (ref 0.0–0.7)
Eosinophils Relative: 5 %
HEMATOCRIT: 37.4 % — AB (ref 39.0–52.0)
HEMOGLOBIN: 12.4 g/dL — AB (ref 13.0–17.0)
LYMPHS ABS: 0.4 10*3/uL — AB (ref 0.7–4.0)
Lymphocytes Relative: 12 %
MCH: 29.7 pg (ref 26.0–34.0)
MCHC: 33.2 g/dL (ref 30.0–36.0)
MCV: 89.7 fL (ref 78.0–100.0)
MONOS PCT: 11 %
Monocytes Absolute: 0.4 10*3/uL (ref 0.1–1.0)
NEUTROS ABS: 2.5 10*3/uL (ref 1.7–7.7)
NEUTROS PCT: 71 %
Platelets: 153 10*3/uL (ref 150–400)
RBC: 4.17 MIL/uL — AB (ref 4.22–5.81)
RDW: 14.4 % (ref 11.5–15.5)
WBC: 3.6 10*3/uL — AB (ref 4.0–10.5)

## 2014-09-27 LAB — COMPREHENSIVE METABOLIC PANEL
ALBUMIN: 3.8 g/dL (ref 3.5–5.0)
ALK PHOS: 69 U/L (ref 38–126)
ALT: 10 U/L — AB (ref 17–63)
AST: 21 U/L (ref 15–41)
Anion gap: 6 (ref 5–15)
BILIRUBIN TOTAL: 0.5 mg/dL (ref 0.3–1.2)
BUN: 11 mg/dL (ref 6–20)
CALCIUM: 8.6 mg/dL — AB (ref 8.9–10.3)
CO2: 27 mmol/L (ref 22–32)
CREATININE: 0.81 mg/dL (ref 0.61–1.24)
Chloride: 106 mmol/L (ref 101–111)
GFR calc non Af Amer: >60 mL/min (ref >60)
GLUCOSE: 96 mg/dL (ref 65–99)
Potassium: 3.8 mmol/L (ref 3.5–5.1)
SODIUM: 139 mmol/L (ref 135–145)
TOTAL PROTEIN: 7.5 g/dL (ref 6.5–8.1)

## 2014-09-27 MED ORDER — HEPARIN SOD (PORK) LOCK FLUSH 100 UNIT/ML IV SOLN
500.0000 [IU] | Freq: Once | INTRAVENOUS | Status: AC
  Administered 2014-09-27: 500 [IU] via INTRAVENOUS

## 2014-09-27 MED ORDER — SODIUM CHLORIDE 0.9 % IJ SOLN
10.0000 mL | INTRAMUSCULAR | Status: DC | PRN
  Administered 2014-09-27: 10 mL via INTRAVENOUS
  Filled 2014-09-27: qty 10

## 2014-09-27 MED ORDER — PROMETHAZINE HCL 25 MG PO TABS: 25.0000 mg | ORAL_TABLET | Freq: Four times a day (QID) | ORAL | Status: DC | PRN

## 2014-09-27 MED ORDER — ONDANSETRON HCL 8 MG PO TABS: 8.0000 mg | ORAL_TABLET | Freq: Three times a day (TID) | ORAL | Status: DC | PRN

## 2014-09-27 MED ORDER — HEPARIN SOD (PORK) LOCK FLUSH 100 UNIT/ML IV SOLN
INTRAVENOUS | Status: AC
  Filled 2014-09-27: qty 5

## 2014-09-27 NOTE — Progress Notes (Signed)
See MD exam for notes

## 2014-09-27 NOTE — Patient Instructions (Signed)
..  [place] at [facility] Discharge Instructions  RECOMMENDATIONS MADE BY THE CONSULTANT AND ANY TEST RESULTS WILL BE SENT TO YOUR REFERRING PHYSICIAN.  Labs in 3 months  Pet scan in 3 months   Thank you for choosing [place] at [facility] to provide your oncology and hematology care.  To afford each patient quality time with our provider, please arrive at least 15 minutes before your scheduled appointment time.    You need to re-schedule your appointment should you arrive 10 or more minutes late.  We strive to give you quality time with our providers, and arriving late affects you and other patients whose appointments are after yours.  Also, if you no show three or more times for appointments you may be dismissed from the clinic at the providers discretion.     Again, thank you for choosing [facility].  Our hope is that these requests will decrease the amount of time that you wait before being seen by our physicians.       _____________________________________________________________  Should you have questions after your visit to [facility], please contact our office at [phone] between the hours of 8:30 a.m. and 4:30 p.m.  Voicemails left after 4:30 p.m. will not be returned until the following business day.  For prescription refill requests, have your pharmacy contact our office.

## 2014-09-27 NOTE — Progress Notes (Signed)
[facility] Progress Note  Patient Care Team: Sharilyn Sites, MD as PCP - General (Family Medicine) Leota Sauers, RN as Registered Nurse (Oncology) Heath Lark, MD as Consulting Physician (Hematology and Oncology) Eppie Gibson, MD as Attending Physician (Radiation Oncology) Patrici Ranks, MD as Consulting Physician (Internal Medicine)  Diagnosis: Squamous Cell Carcinoma of the right tonsil, p16+, T3N2cM0, Stage IVA  Right tonsil and bilateral neck / 70 Gy in 35 fractions to gross disease, 63 Gy in 35 fractions to high risk nodal echelons, and 56 Gy in 35 fractions to intermediate risk nodal echelons 05/02/2013-06/22/2013   Oncology History   Tonsil cancer, HPV positive   Primary site: Pharynx - Oropharynx (Right)   Staging method: AJCC 7th Edition   Clinical free text: HPV positive   Clinical: Stage IVA (T3, N2c, M0) signed by Heath Lark, MD on 04/20/2013  9:29 PM   Summary: Stage IVA (T3, N2c, M0)       Tonsil cancer   02/01/2013 Imaging Ultrasound of the neck revealed bilateral lymphadenopathy in the submandibular region   03/16/2013 Imaging CT scan of the neck show large right tonsil mass measured 3.9 cm in maximum dimension as well as bilateral lymphadenopathy, the largest lymph node measures 31 mm. There is also a left thyroid mass measured 44 mm   03/21/2013 Procedure The patient was seen by ENT with laryngoscopy and biopsy. Pathology is pending   03/25/2013 Imaging PET scan showed large hypermetabolic soft tissue mass in the region of the right palatine tonsil with bilateral cervical hypermetabolic lymphadenopathy indicative of metastatic disease,   04/04/2013 Surgery [name] underwent placement of Port-A-Cath and feeding tube   04/14/2013 Procedure The patient underwent teeth extraction.   05/02/2013 - 06/13/2013 Chemotherapy The dosage of chemotherapy was interrupted many times due to side effects. [name] treatment is terminated early due to severe ulcerated skin toxicity.    05/02/2013 - 06/22/2013 Radiation Therapy Right tonsil and bilateral neck / 70 Gy in 35 fractions to gross disease, 63 Gy in 35 fractions to high risk nodal echelons, and 56 Gy in 35 fractions to intermediate risk nodal echelons 05/02/2013-06/22/2013    10/18/2013 Imaging PET CT scan showed complete response in the oropharynx. However, [name] has evidence of new bilateral pulmonary metastasis.   12/16/2013 Imaging Repeat PET CT scan show significant progression of pulmonary metastases    01/25/2014 Pathology Results Squamous Cell Carcinoma, CT guided biopsy of RUL nodule   02/08/2014 - 04/20/2014 Chemotherapy Cisplatin/Taxol weekly x 8   04/17/2014 Imaging PET Interval improvement in size and hypermetabolism of the patient's multiple bilateral pulmonary metastases. No evidence for abnormal FDG uptake in the tongue base on the current study.Stable enlargement of the left thyroid lobe.    05/04/2014 - 05/11/2014 Chemotherapy Xeloda 2000 mg in AM and 1500 mg in PM 7 days on and 7 days off   05/11/2014 Adverse Reaction Decreased appetite and increased weakness.    HISTORY OF PRESENTING ILLNESS:  [name] 64 y.o. 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   [name] is here today with [name] wife. [name] says [name]'s been doing well, and doing anything [name] wants to do.  They've been going to [place], going out. [name] says [name]'s been eating well and sleeping well, but [name] wife says [name]'s not been sleeping very well.  [name] wife states that [name] has gained some weight.  While there is good news about [name] PET scan and [name]  cancer not recurring, there is a lot of distress today (especially on the part of [name] wife) regarding the state of their daughter. The conversation has been a bit dominated by [name] wife's distress about her daughter's "drug" addiction and state of being. She is worried that her daughters relationship with me will affect how I think about she and Zaydin.  The patient denies any shortness of  breath, coughing or wheezing, abdominal pain or other discomfort.  MEDICAL HISTORY:  Past Medical History  Diagnosis Date  . Chest pain     10/14  . Urethral stricture     s/p dilitation  . Neuropathy     compression neuropathy right hip;s/p replacement  . Allergy   . Fibromyalgia   . Anxiety   . Depression   . Constipation   . Arthritis     knees, HIps, Hands  . Tonsillar cancer   . Type II diabetes mellitus   . Pneumonia 2012  . Complication of anesthesia     bleeding during intubation 04/04/13 due to friability of right tonsillar cancer  . PEG (percutaneous endoscopic gastrostomy) status   . S/P radiation therapy 05/02/2013-06/22/2013    70 [facility] - Squamous Cell Carcinoma of the tonsil, p16+, T3N2cM0    SURGICAL HISTORY: Past Surgical History  Procedure Laterality Date  . Total hip arthroplasty Right 2000    Dr. Percell Miller  . Cystoscopy      Dr. Karsten Ro  . Portacath placement Right 04/04/2013  . Gastrostomy tube placement  04/04/2013  . Portacath placement N/A 04/04/2013    Procedure: INSERTION PORT-A-CATH;  Surgeon: Ralene Ok, MD;  Location: [facility];  Service: General;  Laterality: N/A;  . Laparoscopic gastrostomy N/A 04/04/2013    Procedure: LAPAROSCOPIC GASTROSTOMY TUBE PLACEMENT ;  Surgeon: Ralene Ok, MD;  Location: [facility];  Service: General;  Laterality: N/A;  . Nasal hemorrhage control N/A 04/04/2013    Procedure: Control of oropharyngeal hemorrhage;  Surgeon: Ascencion Dike, MD;  Location: [facility];  Service: ENT;  Laterality: N/A;  . Multiple extractions with alveoloplasty N/A 04/14/2013    Procedure: Extraction of tooth #'s 1,2,3,4,5,6,7,8,9,10,11,12,13,14,15,17,18,19,20,21,22,23,24,25,26,27,28,29, 30, 31, and 32 with alveoloplasty and bilateral mandibular tori reductions.;  Surgeon: Lenn Cal, DDS;  Location: [facility];  Service: Oral Surgery;  Laterality: N/A;    SOCIAL HISTORY: Social History   Social History  . Marital Status: Married    Spouse Name: N/A  .  Number of Children: N/A  . Years of Education: N/A   Occupational History  . Not on file.   Social History Main Topics  . Smoking status: Never Smoker   . Smokeless tobacco: Never Used  . Alcohol Use: No     Comment: Occasional beer  . Drug Use: No  . Sexual Activity: Yes   Other Topics Concern  . Not on file   Social History Narrative  Married for 20 years. They have no children. They have a dog. [name] has a daughter from a prior relationship, they are estranged. No smoking, worked at [employer]. [name] has never smoked. Occasional alcohol.  FAMILY HISTORY: Family History  Problem Relation Age of Onset  . Hyperlipidemia Brother   . Cancer Mother     Deceased with leukemia, had uterine ca  . Cancer Cousin     living brain cancer, male   indicated that [name] mother is deceased. [name] indicated that [name] father is deceased. [name] indicated that [name] cousin is alive.   [name] mother had cervical cancer  and leukemia. 1 brother who is healthy.  ALLERGIES:  is allergic to neurontin and dilaudid.  MEDICATIONS:  Current Outpatient Prescriptions  Medication Sig Dispense Refill  . acetaminophen (TYLENOL) 500 MG tablet Take 500-1,000 mg by mouth every 4 (four) hours as needed for mild pain.     [name] alprazolam (XANAX) 2 MG tablet Take 2 mg by mouth 3 (three) times daily as needed for sleep or anxiety.     . Cholecalciferol (VITAMIN D3) 5000 UNITS CAPS Take 5,000 Units by mouth daily.    . citalopram (CELEXA) 20 MG tablet Take 1 tablet (20 mg total) by mouth daily. 30 tablet 1  . docusate sodium (COLACE) 100 MG capsule Take 100 mg by mouth 2 (two) times daily as needed for mild constipation.    . lidocaine-prilocaine (EMLA) cream Apply a quarter size amount to port site 1 hour prior to chemo. Do not rub in. Cover with plastic wrap. 30 g 3  . nystatin (MYCOSTATIN) 100000 UNIT/ML suspension Take 5 mLs by mouth as needed.     . ondansetron (ZOFRAN) 8 MG tablet Take 1 tablet (8 mg total) by mouth every 8  (eight) hours as needed for nausea. 60 tablet 3  . OxyCODONE (OXYCONTIN) 20 mg T12A 12 hr tablet Take 20 mg by mouth every 12 (twelve) hours.     [name] oxyCODONE-acetaminophen (PERCOCET) 10-325 MG per tablet Take 1 tablet by mouth every 4 (four) hours as needed for pain. 30 tablet 0  . pregabalin (LYRICA) 150 MG capsule Take 2 capsules (300 mg total) by mouth 2 (two) times daily. (Patient taking differently: Take 300 mg by mouth 2 (two) times daily. 2 in am and 2 at night) 60 capsule 3  . promethazine (PHENERGAN) 25 MG tablet Take 1 tablet (25 mg total) by mouth every 6 (six) hours as needed for nausea. 60 tablet 3  . zolpidem (AMBIEN CR) 6.25 MG CR tablet Take 6.25 mg by mouth at bedtime as needed for sleep.    . diphenhydrAMINE (BENADRYL) 25 mg capsule Take 1 capsule (25 mg total) by mouth every 6 (six) hours as needed for itching. (Patient not taking: Reported on 09/27/2014) 60 capsule 0  . levofloxacin (LEVAQUIN) 500 MG tablet Take 1 tablet (500 mg total) by mouth daily. (Patient not taking: Reported on 09/27/2014) 7 tablet 0  . OxyCODONE (OXYCONTIN) 10 mg T12A 12 hr tablet Take 1 tablet (10 mg total) by mouth every 12 (twelve) hours. (Patient not taking: Reported on 09/27/2014) 60 tablet 0  . polyethylene glycol (MIRALAX / GLYCOLAX) packet Take 17 g by mouth as needed.     No current facility-administered medications for this visit.   Facility-Administered Medications Ordered in Other Visits  Medication Dose Route Frequency Provider Last Rate Last Dose  . fludeoxyglucose F - 18 (FDG) injection 10.37 milli Curie  10.37 milli Curie Intravenous Once PRN Medication Radiologist, MD        Review of Systems  Constitutional: Negative for fever, chills, weight loss and  Positive for fatigue.  HENT: Negative for congestion, hearing loss, nosebleeds, sore throat and tinnitus.   Eyes: Negative for blurred vision, double vision, pain and discharge.  Respiratory: Positive for cough. Negative for hemoptysis,  sputum production, shortness of breath and wheezing.   Non productive Cardiovascular: Negative for chest pain, palpitations, claudication, leg swelling and PND.  Gastrointestinal: Negative for heartburn, nausea, vomiting, abdominal pain, diarrhea, constipation, blood in stool and melena.  Genitourinary: Negative for dysuria, urgency, frequency and hematuria.  Musculoskeletal: Negative for myalgias, joint pain and falls.  Skin: Negative for itching and rash.  Neurological: Negative for dizziness, tingling, tremors, sensory change, speech change, focal weakness, seizures, loss of consciousness, weakness and headaches.  Endo/Heme/Allergies: Does not bruise/bleed easily.  Psychiatric/Behavioral: Negative for depression, suicidal ideas, memory loss and substance abuse. The patient is not nervous/anxious and does not have insomnia.    14 point review of systems was performed and is negative except as detailed under history of present illness and above  PHYSICAL EXAMINATION:  ECOG PERFORMANCE STATUS: 0 - Asymptomatic  Filed Vitals:   09/27/14 1048  BP: 112/63  Pulse: 55  Temp: 98 F (36.7 C)  Resp: 18   Filed Weights   09/27/14 1048  Weight: 203 lb (92.08 kg)    Physical Exam  Constitutional: [name] is oriented to person, place, and time and well-developed, well-nourished, and in no distress. [name] looks good. HENT:  Head: Normocephalic and atraumatic.  Nose: Nose normal.  Mouth/Throat: Oropharynx is clear and moist. No oropharyngeal exudate. Eyes: Conjunctivae and EOM are normal. Pupils are equal, round, and reactive to light. Right eye exhibits no discharge. Left eye exhibits no discharge. No scleral icterus.  Neck: Normal range of motion. Neck supple. No tracheal deviation present. No thyromegaly present. Mild lymphedema Cardiovascular: Normal rate, regular rhythm and normal heart sounds.  Exam reveals no gallop and no friction rub.   No murmur heard. Pulmonary/Chest: Effort normal and  breath sounds normal. [name] has no wheezes. [name] has no rales.   Abdominal: Soft. Bowel sounds are normal. [name] exhibits no distension and no mass. There is no tenderness. There is no rebound and no guarding.   Musculoskeletal: Normal range of motion. [name] exhibits no edema.  Lymphadenopathy:    [name] has no cervical adenopathy.  Neurological: [name] is alert and oriented to person, place, and time. [name] has normal reflexes. No cranial nerve deficit. Gait normal. Coordination normal.  Skin: Skin is warm and dry. No rash noted.  Psychiatric: Mood, memory, affect and judgment normal.  Nursing note and vitals reviewed.   LABORATORY DATA:  I have reviewed the data as listed Lab Results  Component Value Date   WBC 3.6* 09/27/2014   HGB 12.4* 09/27/2014   HCT 37.4* 09/27/2014   MCV 89.7 09/27/2014   PLT 153 09/27/2014     Chemistry      Component Value Date/Time   NA 139 09/27/2014 1100   NA 142 12/16/2013 1305   K 3.8 09/27/2014 1100   K 4.7 12/16/2013 1305   CL 106 09/27/2014 1100   CO2 27 09/27/2014 1100   CO2 32* 12/16/2013 1305   BUN 11 09/27/2014 1100   BUN 16.6 12/16/2013 1305   CREATININE 0.81 09/27/2014 1100   CREATININE 0.8 12/16/2013 1305      Component Value Date/Time   CALCIUM 8.6* 09/27/2014 1100   CALCIUM 9.9 12/16/2013 1305   ALKPHOS 69 09/27/2014 1100   ALKPHOS 84 12/16/2013 1305   AST 21 09/27/2014 1100   AST 20 12/16/2013 1305   ALT 10* 09/27/2014 1100   ALT 13 12/16/2013 1305   BILITOT 0.5 09/27/2014 1100   BILITOT 0.45 12/16/2013 1305     RADIOLOGY:  CLINICAL DATA: Subsequent treatment strategy for head neck carcinoma.  EXAM: NUCLEAR MEDICINE PET SKULL BASE TO THIGH  TECHNIQUE: 10.4 mCi F-18 FDG was injected intravenously. Full-ring PET imaging was performed from the skull base to thigh after the radiotracer. CT data was obtained and used for attenuation  correction and anatomic localization.  FASTING BLOOD GLUCOSE: Value: 88 mg/dl  COMPARISON:  PET-CT 07/24/2014, PET-CT 12/16/2013  FINDINGS: NECK  No hypermetabolic lymph nodes in the neck. Physiologic muscle activity noted in the LEFT neck.  CHEST  No hypermetabolic mediastinal lymph nodes. No hypermetabolic pulmonary nodules.  There are stable small scattered bilateral pulmonary nodules. For example 9 mm RIGHT upper lobe nodule on image 25, series 6 is unchanged. 4 mm nodule RIGHT middle lobe is unchanged on image 50. LEFT Apical nodular pleural parenchymal thickening measures 8 mm is not changed. No new pulmonary nodules.  ABDOMEN/PELVIS  No abnormal hypermetabolic activity within the liver, pancreas, adrenal glands, or spleen. No hypermetabolic lymph nodes in the abdomen or pelvis. Bladder remains persistently enlarged.  SKELETON  No focal hypermetabolic activity to suggest skeletal metastasis.  IMPRESSION: 1. No evidence head and neck carcinoma recurrence or metastasis. 2. Stable bilateral scattered pulmonary nodules.   Electronically Signed  By: Suzy Bouchard M.D.  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 [name] is a lifelong nonsmoker, unfortunately has stage IV disease. [name] completed therapy with Cisplatin and Taxol given weekly. [name] had an excellent response. [name] performance status is a 0-1.   I reviewed [name] PET with [name] and [name] wife and discussed repeat imaging prior to [name] next appointment. [name] is doing very well and I have encouraged them to enjoy this time. We did discuss other therapies when/if needed. [name] wife continues to be hopeful that [name] is cured. I again reminded her that [name] had stage IV disease. I advised her that it is very positive that [name] continues to do well on [name] scans look good. But I tried to remind her that we may need chemotherapy again. I also tried to remind her of the  prognosis of stage IV disease long-term.  I advised her that her daughters problems do not affect how I feel about Fro[place] or her.  We will schedule for a routine follow up in 3 months post PET.   All questions were answered. The patient knows to call the clinic with any problems, questions or concerns.  This document serves as a record of services personally performed by Ancil Linsey, MD. It was created on her behalf by Toni Amend, a trained medical scribe. The creation of this record is based on the scribe's personal observations and the provider's statements to them. This document has been checked and approved by the attending provider.  I have reviewed the above documentation for accuracy and completeness and I agree with the above.  Kelby Fam. Whitney Muse, MD

## 2014-09-27 NOTE — Progress Notes (Signed)
..  Joyce Copa Carpenito presented for Portacath access and flush.  Proper placement of portacath confirmed by CXR.  Portacath located rt chest wall accessed with  H 20 needle.  Good blood return present. Portacath flushed with 56m NS and 500U/565mHeparin and needle removed intact.  Procedure tolerated well and without incident.

## 2014-10-12 ENCOUNTER — Encounter (HOSPITAL_COMMUNITY): Payer: 59

## 2014-11-22 ENCOUNTER — Encounter (HOSPITAL_COMMUNITY): Payer: 59 | Attending: Hematology & Oncology

## 2014-11-22 ENCOUNTER — Encounter (HOSPITAL_COMMUNITY): Payer: Self-pay

## 2014-11-22 DIAGNOSIS — Z9221 Personal history of antineoplastic chemotherapy: Secondary | ICD-10-CM | POA: Insufficient documentation

## 2014-11-22 DIAGNOSIS — C7802 Secondary malignant neoplasm of left lung: Secondary | ICD-10-CM | POA: Diagnosis not present

## 2014-11-22 DIAGNOSIS — C099 Malignant neoplasm of tonsil, unspecified: Secondary | ICD-10-CM | POA: Diagnosis not present

## 2014-11-22 DIAGNOSIS — Z452 Encounter for adjustment and management of vascular access device: Secondary | ICD-10-CM | POA: Diagnosis not present

## 2014-11-22 DIAGNOSIS — Z923 Personal history of irradiation: Secondary | ICD-10-CM | POA: Insufficient documentation

## 2014-11-22 DIAGNOSIS — C7801 Secondary malignant neoplasm of right lung: Secondary | ICD-10-CM | POA: Diagnosis not present

## 2014-11-22 MED ORDER — HEPARIN SOD (PORK) LOCK FLUSH 100 UNIT/ML IV SOLN
500.0000 [IU] | Freq: Once | INTRAVENOUS | Status: AC
  Administered 2014-11-22: 500 [IU] via INTRAVENOUS
  Filled 2014-11-22: qty 5

## 2014-11-22 MED ORDER — SODIUM CHLORIDE 0.9 % IJ SOLN
10.0000 mL | INTRAMUSCULAR | Status: DC | PRN
  Administered 2014-11-22: 10 mL via INTRAVENOUS
  Filled 2014-11-22: qty 10

## 2014-11-22 NOTE — Patient Instructions (Signed)
[place] at [facility] Discharge Instructions  RECOMMENDATIONS MADE BY THE CONSULTANT AND ANY TEST RESULTS WILL BE SENT TO YOUR REFERRING PHYSICIAN.  Port flush today. Return as scheduled for office visit and port flush with lab work.  Thank you for choosing [place] at [facility] to provide your oncology and hematology care.  To afford each patient quality time with our provider, please arrive at least 15 minutes before your scheduled appointment time.    You need to re-schedule your appointment should you arrive 10 or more minutes late.  We strive to give you quality time with our providers, and arriving late affects you and other patients whose appointments are after yours.  Also, if you no show three or more times for appointments you may be dismissed from the clinic at the providers discretion.     Again, thank you for choosing [facility].  Our hope is that these requests will decrease the amount of time that you wait before being seen by our physicians.       _____________________________________________________________  Should you have questions after your visit to [facility], please contact our office at [phone] between the hours of 8:30 a.m. and 4:30 p.m.  Voicemails left after 4:30 p.m. will not be returned until the following business day.  For prescription refill requests, have your pharmacy contact our office.

## 2014-11-22 NOTE — Progress Notes (Signed)
[name] presented for Portacath access and flush.  Portacath located right chest wall accessed with  H 20 needle.  Good blood return present. Portacath flushed with 60m NS and 500U/560mHeparin and needle removed intact.  Procedure tolerated well and without incident.

## 2014-12-13 DIAGNOSIS — Z6824 Body mass index (BMI) 24.0-24.9, adult: Secondary | ICD-10-CM | POA: Diagnosis not present

## 2014-12-13 DIAGNOSIS — G894 Chronic pain syndrome: Secondary | ICD-10-CM | POA: Diagnosis not present

## 2014-12-13 DIAGNOSIS — F419 Anxiety disorder, unspecified: Secondary | ICD-10-CM | POA: Diagnosis not present

## 2014-12-13 DIAGNOSIS — Z1389 Encounter for screening for other disorder: Secondary | ICD-10-CM | POA: Diagnosis not present

## 2014-12-14 ENCOUNTER — Ambulatory Visit (HOSPITAL_COMMUNITY)
Admission: RE | Admit: 2014-12-14 | Discharge: 2014-12-14 | Disposition: A | Discharge status: Home / Self Care | Payer: Medicare Other | Source: Ambulatory Visit | Attending: Hematology & Oncology | Admitting: Hematology & Oncology

## 2014-12-14 DIAGNOSIS — C099 Malignant neoplasm of tonsil, unspecified: Secondary | ICD-10-CM | POA: Diagnosis not present

## 2014-12-14 DIAGNOSIS — C7800 Secondary malignant neoplasm of unspecified lung: Secondary | ICD-10-CM

## 2014-12-14 DIAGNOSIS — Z0189 Encounter for other specified special examinations: Secondary | ICD-10-CM | POA: Insufficient documentation

## 2014-12-14 DIAGNOSIS — R918 Other nonspecific abnormal finding of lung field: Secondary | ICD-10-CM | POA: Insufficient documentation

## 2014-12-14 LAB — GLUCOSE, CAPILLARY: Glucose-Capillary: 86 mg/dL (ref 65–99)

## 2014-12-14 MED ORDER — FLUDEOXYGLUCOSE F - 18 (FDG) INJECTION
10.7600 | Freq: Once | INTRAVENOUS | Status: AC | PRN
  Administered 2014-12-14: 10.76 via INTRAVENOUS

## 2014-12-18 ENCOUNTER — Ambulatory Visit (HOSPITAL_COMMUNITY): Payer: Self-pay | Admitting: Hematology & Oncology

## 2014-12-18 ENCOUNTER — Other Ambulatory Visit (HOSPITAL_COMMUNITY): Payer: Self-pay

## 2014-12-27 ENCOUNTER — Encounter (HOSPITAL_COMMUNITY): Payer: Self-pay | Admitting: Hematology & Oncology

## 2014-12-27 ENCOUNTER — Other Ambulatory Visit (HOSPITAL_COMMUNITY): Payer: Self-pay

## 2014-12-27 ENCOUNTER — Encounter (HOSPITAL_COMMUNITY): Payer: Medicare Other | Attending: Hematology & Oncology | Admitting: Hematology & Oncology

## 2014-12-27 VITALS — BP 115/56 | HR 68 | Temp 97.9°F | Resp 16 | Wt 212.0 lb

## 2014-12-27 DIAGNOSIS — Z9221 Personal history of antineoplastic chemotherapy: Secondary | ICD-10-CM | POA: Insufficient documentation

## 2014-12-27 DIAGNOSIS — Z923 Personal history of irradiation: Secondary | ICD-10-CM | POA: Insufficient documentation

## 2014-12-27 DIAGNOSIS — C7801 Secondary malignant neoplasm of right lung: Secondary | ICD-10-CM

## 2014-12-27 DIAGNOSIS — C099 Malignant neoplasm of tonsil, unspecified: Secondary | ICD-10-CM

## 2014-12-27 DIAGNOSIS — J690 Pneumonitis due to inhalation of food and vomit: Secondary | ICD-10-CM | POA: Diagnosis not present

## 2014-12-27 DIAGNOSIS — C7802 Secondary malignant neoplasm of left lung: Secondary | ICD-10-CM | POA: Insufficient documentation

## 2014-12-27 MED ORDER — AMOXICILLIN-POT CLAVULANATE 875-125 MG PO TABS: 1.0000 | ORAL_TABLET | Freq: Two times a day (BID) | ORAL | Status: DC

## 2014-12-27 NOTE — Patient Instructions (Addendum)
[place] at [facility] Discharge Instructions  RECOMMENDATIONS MADE BY THE CONSULTANT AND ANY TEST RESULTS WILL BE SENT TO YOUR REFERRING PHYSICIAN.   Exam completed by Dr Whitney Muse today Port flushes every 8 weeks scans in 3 months We are going to give you some antibiotics We are going a study for your swallowing  Return to see the doctor after PET scan  Please call the clinic if you have any questions or concerns  Thank you for choosing [place] at [facility] to provide your oncology and hematology care.  To afford each patient quality time with our provider, please arrive at least 15 minutes before your scheduled appointment time.    You need to re-schedule your appointment should you arrive 10 or more minutes late.  We strive to give you quality time with our providers, and arriving late affects you and other patients whose appointments are after yours.  Also, if you no show three or more times for appointments you may be dismissed from the clinic at the providers discretion.     Again, thank you for choosing [facility].  Our hope is that these requests will decrease the amount of time that you wait before being seen by our physicians.       _____________________________________________________________  Should you have questions after your visit to [facility], please contact our office at [phone] between the hours of 8:30 a.m. and 4:30 p.m.  Voicemails left after 4:30 p.m. will not be returned until the following business day.  For prescription refill requests, have your pharmacy contact our office.
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
## 2015-02-06 ENCOUNTER — Ambulatory Visit (HOSPITAL_COMMUNITY): Payer: Medicare Other | Admitting: Speech Pathology

## 2015-02-14 ENCOUNTER — Ambulatory Visit (HOSPITAL_COMMUNITY): Payer: Medicare Other | Admitting: Speech Pathology

## 2015-02-14 DIAGNOSIS — J029 Acute pharyngitis, unspecified: Secondary | ICD-10-CM | POA: Diagnosis not present

## 2015-02-14 DIAGNOSIS — Z6825 Body mass index (BMI) 25.0-25.9, adult: Secondary | ICD-10-CM | POA: Diagnosis not present

## 2015-02-14 DIAGNOSIS — J209 Acute bronchitis, unspecified: Secondary | ICD-10-CM | POA: Diagnosis not present

## 2015-02-21 ENCOUNTER — Encounter (HOSPITAL_COMMUNITY): Payer: Self-pay

## 2015-02-21 ENCOUNTER — Encounter (HOSPITAL_COMMUNITY): Payer: Medicare Other | Attending: Hematology & Oncology

## 2015-02-21 ENCOUNTER — Other Ambulatory Visit (HOSPITAL_COMMUNITY): Payer: Self-pay | Admitting: Oncology

## 2015-02-21 DIAGNOSIS — Z9221 Personal history of antineoplastic chemotherapy: Secondary | ICD-10-CM | POA: Diagnosis not present

## 2015-02-21 DIAGNOSIS — C7800 Secondary malignant neoplasm of unspecified lung: Secondary | ICD-10-CM | POA: Diagnosis not present

## 2015-02-21 DIAGNOSIS — C099 Malignant neoplasm of tonsil, unspecified: Secondary | ICD-10-CM | POA: Insufficient documentation

## 2015-02-21 DIAGNOSIS — C7802 Secondary malignant neoplasm of left lung: Secondary | ICD-10-CM | POA: Insufficient documentation

## 2015-02-21 DIAGNOSIS — Z923 Personal history of irradiation: Secondary | ICD-10-CM | POA: Diagnosis not present

## 2015-02-21 DIAGNOSIS — C7801 Secondary malignant neoplasm of right lung: Secondary | ICD-10-CM | POA: Diagnosis not present

## 2015-02-21 DIAGNOSIS — Z452 Encounter for adjustment and management of vascular access device: Secondary | ICD-10-CM | POA: Diagnosis not present

## 2015-02-21 LAB — CBC WITH DIFFERENTIAL/PLATELET
BASOS ABS: 0 10*3/uL (ref 0.0–0.1)
BASOS PCT: 0 %
Eosinophils Absolute: 0.3 10*3/uL (ref 0.0–0.7)
Eosinophils Relative: 3 %
HEMATOCRIT: 38.4 % — AB (ref 39.0–52.0)
Hemoglobin: 12.4 g/dL — ABNORMAL LOW (ref 13.0–17.0)
LYMPHS PCT: 7 %
Lymphs Abs: 0.5 10*3/uL — ABNORMAL LOW (ref 0.7–4.0)
MCH: 29.1 pg (ref 26.0–34.0)
MCHC: 32.3 g/dL (ref 30.0–36.0)
MCV: 90.1 fL (ref 78.0–100.0)
MONO ABS: 0.5 10*3/uL (ref 0.1–1.0)
Monocytes Relative: 7 %
NEUTROS PCT: 83 %
Neutro Abs: 6.7 10*3/uL (ref 1.7–7.7)
Platelets: 152 10*3/uL (ref 150–400)
RBC: 4.26 MIL/uL (ref 4.22–5.81)
RDW: 15.2 % (ref 11.5–15.5)
WBC: 8.1 10*3/uL (ref 4.0–10.5)

## 2015-02-21 LAB — COMPREHENSIVE METABOLIC PANEL
ALBUMIN: 3.8 g/dL (ref 3.5–5.0)
ALT: 12 U/L — AB (ref 17–63)
AST: 19 U/L (ref 15–41)
Alkaline Phosphatase: 77 U/L (ref 38–126)
Anion gap: 7 (ref 5–15)
BILIRUBIN TOTAL: 0.6 mg/dL (ref 0.3–1.2)
BUN: 16 mg/dL (ref 6–20)
CHLORIDE: 102 mmol/L (ref 101–111)
CO2: 30 mmol/L (ref 22–32)
CREATININE: 0.8 mg/dL (ref 0.61–1.24)
Calcium: 9.1 mg/dL (ref 8.9–10.3)
GFR calc Af Amer: >60 mL/min (ref >60)
GFR calc non Af Amer: >60 mL/min (ref >60)
GLUCOSE: 82 mg/dL (ref 65–99)
POTASSIUM: 4.1 mmol/L (ref 3.5–5.1)
Sodium: 139 mmol/L (ref 135–145)
Total Protein: 7.7 g/dL (ref 6.5–8.1)

## 2015-02-21 MED ORDER — HEPARIN SOD (PORK) LOCK FLUSH 100 UNIT/ML IV SOLN
INTRAVENOUS | Status: AC
  Filled 2015-02-21: qty 5

## 2015-02-21 MED ORDER — SODIUM CHLORIDE 0.9% FLUSH
10.0000 mL | INTRAVENOUS | Status: DC | PRN
  Administered 2015-02-21: 10 mL via INTRAVENOUS
  Filled 2015-02-21: qty 10

## 2015-02-21 MED ORDER — HEPARIN SOD (PORK) LOCK FLUSH 100 UNIT/ML IV SOLN
500.0000 [IU] | Freq: Once | INTRAVENOUS | Status: AC
  Administered 2015-02-21: 500 [IU] via INTRAVENOUS

## 2015-02-21 NOTE — Progress Notes (Signed)
[name] presented for Portacath access and flush. Portacath located right  chest wall accessed with  H 20 needle. Good blood return present. Portacath flushed with 82m NS and 500U/54mHeparin and needle removed intact. Procedure without incident. Patient tolerated procedure well.   Labs drawn per orders.

## 2015-02-21 NOTE — Patient Instructions (Signed)
[place] at [facility] Discharge Instructions  RECOMMENDATIONS MADE BY THE CONSULTANT AND ANY TEST RESULTS WILL BE SENT TO YOUR REFERRING PHYSICIAN.  Port flush with labs today. See you at your next appointment  Thank you for choosing [place] at [facility] to provide your oncology and hematology care.  To afford each patient quality time with our provider, please arrive at least 15 minutes before your scheduled appointment time.   Beginning January 23rd 2017 lab work for the [facility] will be done in the  Main lab at [facility] on 1st floor. If you have a lab appointment with the [place] please come in thru the  Main Entrance and check in at the main information desk  You need to re-schedule your appointment should you arrive 10 or more minutes late.  We strive to give you quality time with our providers, and arriving late affects you and other patients whose appointments are after yours.  Also, if you no show three or more times for appointments you may be dismissed from the clinic at the providers discretion.     Again, thank you for choosing [facility].  Our hope is that these requests will decrease the amount of time that you wait before being seen by our physicians.       _____________________________________________________________  Should you have questions after your visit to [facility], please contact our office at [phone] between the hours of 8:30 a.m. and 4:30 p.m.  Voicemails left after 4:30 p.m. will not be returned until the following business day.  For prescription refill requests, have your pharmacy contact our office.

## 2015-02-26 ENCOUNTER — Ambulatory Visit (HOSPITAL_COMMUNITY): Payer: Medicare Other | Attending: Hematology & Oncology | Admitting: Speech Pathology

## 2015-02-26 DIAGNOSIS — R1310 Dysphagia, unspecified: Secondary | ICD-10-CM

## 2015-02-26 NOTE — Therapy (Signed)
[place], [place], [address] Phone: [phone]   Fax:  [phone]  Speech Language Pathology Evaluation/Clinical Swallow Evaluation  Patient Details  Name: [name] MRN: [id number] Date of Birth: May 12, 1950 No Data Recorded  Encounter Date: 02/26/2015      End of Session - 02/26/15 1532    Visit Number 1   Number of Visits 1   Authorization Type Medicare   SLP Start Time 1440   SLP Stop Time  1520   SLP Time Calculation (min) 40 min   Activity Tolerance Patient tolerated treatment well      Past Medical History  Diagnosis Date  . Chest pain     10/14  . Urethral stricture     s/p dilitation  . Neuropathy (HCC)     compression neuropathy right hip;s/p replacement  . Allergy   . Fibromyalgia   . Anxiety   . Depression   . Constipation   . Arthritis     knees, HIps, Hands  . Tonsillar cancer ([place])   . Type II diabetes mellitus ([place])   . Pneumonia 2012  . Complication of anesthesia     bleeding during intubation 04/04/13 due to friability of right tonsillar cancer  . PEG (percutaneous endoscopic gastrostomy) status ([place])   . 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 Total hip arthroplasty Right 2000    Dr. Percell Miller  . Cystoscopy      Dr. Karsten Ro  . Portacath placement Right 04/04/2013  . Gastrostomy tube placement  04/04/2013  . Portacath placement N/A 04/04/2013    Procedure: INSERTION PORT-A-CATH;  Surgeon: Ralene Ok, MD;  Location: [facility];  Service: General;  Laterality: N/A;  . Laparoscopic gastrostomy N/A 04/04/2013    Procedure: LAPAROSCOPIC GASTROSTOMY TUBE PLACEMENT ;  Surgeon: Ralene Ok, MD;  Location: [facility];  Service: General;  Laterality: N/A;  . Nasal hemorrhage control N/A 04/04/2013    Procedure: Control of oropharyngeal hemorrhage;  Surgeon: Ascencion Dike, MD;  Location: [facility];  Service: ENT;  Laterality: N/A;  . Multiple extractions with alveoloplasty N/A 04/14/2013    Procedure: Extraction of tooth #'s 1,2,3,4,5,6,7,8,9,10,11,12,13,14,15,17,18,19,20,21,22,23,24,25,26,27,28,29, 30, 31, and 32 with alveoloplasty and bilateral mandibular tori reductions.;  Surgeon: Lenn Cal, DDS;  Location: [facility];  Service: Oral Surgery;  Laterality: N/A;    There were no vitals filed for this visit.  Visit Diagnosis: Dysphagia      Subjective Assessment - 02/26/15 1447    Subjective "I don't know why I'm here, but here I am."   Currently in Pain? 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 He completed therapy with Cisplatin and Taxol given weekly. He had an excellent response. Pt status post concurrent chemoradiation therapy, with subsequent lung metastases, status post additional chemotherapy. Pt referred to SLP for clinical swallow evaluation due to aspiration suspected given findings on  PET scan in December. Pt's wife reported frequent coughing during meals when pt not wearing dentures. Both pt and wife report decreased signs/symptoms aspiration now that he wears dentures that fit well. They were seen by SLP a couple years ago when he was undergoing treatment for tonsillar CA. Wife states that pt no longer completed exercises.    Type of Study Bedside Swallow Evaluation   Previous Swallow Assessment none on record   Diet Prior to this Study Regular;Thin liquids   Temperature Spikes Noted No   Respiratory Status Room air   History of Recent Intubation No   Behavior/Cognition  Alert;Cooperative;Pleasant mood   Oral Cavity Assessment Within Functional Limits;Dry   Oral Care Completed by SLP No   Oral Cavity - Dentition Dentures, top;Dentures, bottom   Vision Functional for self-feeding   Self-Feeding Abilities Able to feed self   Patient Positioning Upright in chair   Baseline Vocal Quality Normal   Volitional Cough Strong   Volitional Swallow Able to elicit          Oral Motor/Sensory Function - 02/26/15 1531    Oral Motor/Sensory Function   Overall Oral Motor/Sensory Function Within functional limits  dry oral cavity, mild lingual weakness         Ice Chips - 02/26/15 1531    Ice Chips   Ice chips Not tested         Thin Liquid - 02/26/15 1531    Thin Liquid   Thin Liquid Within functional limits   Presentation Cup;Self Fed         Nectar thick liquid - 02/26/15 1531    Nectar Thick Liquid   Nectar Thick Liquid Not tested         Honey Thick Liquid - 02/26/15 1531    Honey Thick Liquid   Honey Thick Liquid Not tested         Puree - 02/26/15 1531    Puree   Puree Within functional limits   Presentation Self Fed;Spoon         Solid - 02/26/15 1531    Solid   Solid Within functional limits   Presentation Self Fed   Other Comments mild labial residue with crackers likely due to xerostomia           Plan - 02/26/15 1533    Clinical Impression Statement Pt accompanied by his wife for clinical swallow evaluation. Pt reports that he no longer has difficulty swallowing his foods now that his dentures fit properly. SLP completed oral motor evaluation which was significant for mild lingual weakness and pt with upper and lower dentures. Pt with mild stiffness (s/p radiation therapy fibrosis likely) to neck, however laryngeal elevation palpable. Pt consumed thin liquids, puree texture, and regular textures (crackers) without difficulty. Pt with mild labial residue with crackers likely negatively impacted by xerostomia. Pt  benefited from liquid wash to clear. His wife spoke frequently throughout the evaluation and reported that she was a former Insurance claims handler" and was familiar with aspiration. She also reported that she now has dentures too because her teeth corroded after "kissing him" after radiation therapy. SLP provided list of recommended mechanical soft texture food ideas and written aspiration precautions and swallow recommendations. Recommend D3/mech soft diet with thin liquids: take small bites, masticate foods thoroughly, avoid talking during meals, swallow 2x for each bite/sip, and sit fully upright for all eating/drinking and remain upright for 30-60 minutes after. Pt also shown two swallow exercises (modified Shaker and tongue press) and  encouraged to continue for the rest of his life. Wife states that pt was previously given swallowing exercises, but had stopped completing them. SLP reviewed impact of radiation changes and encouraged pt to continue with exercises. Do not feel objective testing is warranted at this time, however can be completed if problems arise. Pt/wife agreeable to plan of care and given SLP phone number should they have further questions.    Consulted and Agree with Plan of Care Patient;Family member/caregiver   Family Member Consulted Wife          G-Codes - 2015/03/16 1534    Functional Assessment Tool Used clinical judgment   Functional Limitations Swallowing   Swallow Current Status [phone]) At least 1 percent but less than 20 percent impaired, limited or restricted   Swallow Goal Status (M0102) At least 1 percent but less than 20 percent impaired, limited or restricted   Swallow Discharge Status [phone]) At least 1 percent but less than 20 percent impaired, limited or restricted      Problem List Patient Active Problem List   Diagnosis Date Noted  . Metastasis to lung ([place]) 10/19/2013  . Mucositis (ulcerative) due to antineoplastic therapy 06/10/2013  . Protein-calorie malnutrition,  severe ([place]) 06/10/2013  . Rash 06/10/2013  . Gastrostomy tube dyslodgement - replaced 05/06/2013 05/06/2013  . Gastrostomy in place [facility]) 04/04/2013  . Tonsil cancer ([place]) 03/22/2013  . Diabetic neuropathy, painful ([place]) 03/22/2013  . Diabetes mellitus, type 2 ([place]) 10/18/2012  . Other and unspecified hyperlipidemia 10/18/2012  . Obesity, unspecified 10/18/2012   Thank you,  Genene Churn, [place]   [facility] 16-Mar-2015, 3:34 PM  [place] [address] [place], [place], [address] Phone: [phone]   Fax:  [phone]  Name: [name] MRN: [id number] Date of Birth: 1950-10-17

## 2015-03-08 ENCOUNTER — Other Ambulatory Visit (HOSPITAL_COMMUNITY): Payer: Self-pay | Admitting: Hematology & Oncology

## 2015-04-04 ENCOUNTER — Ambulatory Visit (HOSPITAL_COMMUNITY)
Admission: RE | Admit: 2015-04-04 | Discharge: 2015-04-04 | Disposition: A | Discharge status: Home / Self Care | Payer: Medicare Other | Source: Ambulatory Visit | Attending: Hematology & Oncology | Admitting: Hematology & Oncology

## 2015-04-04 DIAGNOSIS — C099 Malignant neoplasm of tonsil, unspecified: Secondary | ICD-10-CM | POA: Diagnosis not present

## 2015-04-04 DIAGNOSIS — R918 Other nonspecific abnormal finding of lung field: Secondary | ICD-10-CM | POA: Diagnosis not present

## 2015-04-04 DIAGNOSIS — C7801 Secondary malignant neoplasm of right lung: Secondary | ICD-10-CM | POA: Insufficient documentation

## 2015-04-04 DIAGNOSIS — C7802 Secondary malignant neoplasm of left lung: Secondary | ICD-10-CM | POA: Insufficient documentation

## 2015-04-04 LAB — GLUCOSE, CAPILLARY: Glucose-Capillary: 85 mg/dL (ref 65–99)

## 2015-04-04 MED ORDER — FLUDEOXYGLUCOSE F - 18 (FDG) INJECTION
10.3000 | Freq: Once | INTRAVENOUS | Status: AC | PRN
  Administered 2015-04-04: 10.3 via INTRAVENOUS

## 2015-04-06 ENCOUNTER — Encounter (HOSPITAL_COMMUNITY): Payer: Self-pay | Admitting: Hematology & Oncology

## 2015-04-06 ENCOUNTER — Encounter (HOSPITAL_COMMUNITY): Payer: Medicare Other | Attending: Hematology & Oncology | Admitting: Hematology & Oncology

## 2015-04-06 VITALS — BP 124/70 | HR 71 | Temp 97.7°F | Resp 18 | Wt 208.9 lb

## 2015-04-06 DIAGNOSIS — C7800 Secondary malignant neoplasm of unspecified lung: Secondary | ICD-10-CM | POA: Diagnosis not present

## 2015-04-06 DIAGNOSIS — Z95828 Presence of other vascular implants and grafts: Secondary | ICD-10-CM

## 2015-04-06 DIAGNOSIS — Z923 Personal history of irradiation: Secondary | ICD-10-CM | POA: Diagnosis not present

## 2015-04-06 DIAGNOSIS — C7801 Secondary malignant neoplasm of right lung: Secondary | ICD-10-CM | POA: Insufficient documentation

## 2015-04-06 DIAGNOSIS — Z452 Encounter for adjustment and management of vascular access device: Secondary | ICD-10-CM

## 2015-04-06 DIAGNOSIS — J690 Pneumonitis due to inhalation of food and vomit: Secondary | ICD-10-CM

## 2015-04-06 DIAGNOSIS — Z9221 Personal history of antineoplastic chemotherapy: Secondary | ICD-10-CM | POA: Diagnosis not present

## 2015-04-06 DIAGNOSIS — C7802 Secondary malignant neoplasm of left lung: Secondary | ICD-10-CM | POA: Insufficient documentation

## 2015-04-06 DIAGNOSIS — C099 Malignant neoplasm of tonsil, unspecified: Secondary | ICD-10-CM | POA: Diagnosis not present

## 2015-04-06 LAB — COMPREHENSIVE METABOLIC PANEL
ALT: 15 U/L — ABNORMAL LOW (ref 17–63)
ANION GAP: 7 (ref 5–15)
AST: 23 U/L (ref 15–41)
Albumin: 3.9 g/dL (ref 3.5–5.0)
Alkaline Phosphatase: 75 U/L (ref 38–126)
BUN: 16 mg/dL (ref 6–20)
CHLORIDE: 105 mmol/L (ref 101–111)
CO2: 27 mmol/L (ref 22–32)
Calcium: 8.8 mg/dL — ABNORMAL LOW (ref 8.9–10.3)
Creatinine, Ser: 0.75 mg/dL (ref 0.61–1.24)
GFR calc non Af Amer: >60 mL/min (ref >60)
Glucose, Bld: 87 mg/dL (ref 65–99)
Potassium: 3.8 mmol/L (ref 3.5–5.1)
SODIUM: 139 mmol/L (ref 135–145)
Total Bilirubin: 0.6 mg/dL (ref 0.3–1.2)
Total Protein: 7.8 g/dL (ref 6.5–8.1)

## 2015-04-06 LAB — CBC WITH DIFFERENTIAL/PLATELET
Basophils Absolute: 0 10*3/uL (ref 0.0–0.1)
Basophils Relative: 1 %
EOS ABS: 0.2 10*3/uL (ref 0.0–0.7)
EOS PCT: 4 %
HCT: 40.8 % (ref 39.0–52.0)
Hemoglobin: 13 g/dL (ref 13.0–17.0)
LYMPHS ABS: 0.5 10*3/uL — AB (ref 0.7–4.0)
Lymphocytes Relative: 12 %
MCH: 28.8 pg (ref 26.0–34.0)
MCHC: 31.9 g/dL (ref 30.0–36.0)
MCV: 90.5 fL (ref 78.0–100.0)
Monocytes Absolute: 0.5 10*3/uL (ref 0.1–1.0)
Monocytes Relative: 11 %
Neutro Abs: 3 10*3/uL (ref 1.7–7.7)
Neutrophils Relative %: 72 %
PLATELETS: 177 10*3/uL (ref 150–400)
RBC: 4.51 MIL/uL (ref 4.22–5.81)
RDW: 15.7 % — ABNORMAL HIGH (ref 11.5–15.5)
WBC: 4.2 10*3/uL (ref 4.0–10.5)

## 2015-04-06 MED ORDER — HEPARIN SOD (PORK) LOCK FLUSH 100 UNIT/ML IV SOLN
INTRAVENOUS | Status: AC
  Filled 2015-04-06: qty 5

## 2015-04-06 MED ORDER — HEPARIN SOD (PORK) LOCK FLUSH 100 UNIT/ML IV SOLN
500.0000 [IU] | Freq: Once | INTRAVENOUS | Status: AC
Start: 2015-04-06 — End: 2015-04-06
  Administered 2015-04-06: 500 [IU] via INTRAVENOUS

## 2015-04-06 MED ORDER — SODIUM CHLORIDE 0.9% FLUSH
10.0000 mL | INTRAVENOUS | Status: DC | PRN
  Administered 2015-04-06: 10 mL via INTRAVENOUS
  Filled 2015-04-06: qty 10

## 2015-04-06 MED ORDER — AMOXICILLIN-POT CLAVULANATE 875-125 MG PO TABS: 1.0000 | ORAL_TABLET | Freq: Two times a day (BID) | ORAL | Status: DC

## 2015-04-06 NOTE — Progress Notes (Signed)
[place] PROGRESS NOTE  Patient Care Team: Sharilyn Sites, MD as PCP - General (Family Medicine) Leota Sauers, RN as Registered Nurse (Oncology) Heath Lark, MD as Consulting Physician (Hematology and Oncology) Eppie Gibson, MD as Attending Physician (Radiation Oncology) Patrici Ranks, MD as Consulting Physician (Internal Medicine)  Diagnosis: Squamous Cell Carcinoma of the right tonsil, p16+, T3N2cM0, Stage IVA  Right tonsil and bilateral neck / 70 Gy in 35 fractions to gross disease, 63 Gy in 35 fractions to high risk nodal echelons, and 56 Gy in 35 fractions to intermediate risk nodal echelons 05/02/2013-06/22/2013   Oncology History   Tonsil cancer, HPV positive   Primary site: Pharynx - Oropharynx (Right)   Staging method: AJCC 7th Edition   Clinical free text: HPV positive   Clinical: Stage IVA (T3, N2c, M0) signed by Heath Lark, MD on 04/20/2013  9:29 PM   Summary: Stage IVA (T3, N2c, M0)       Tonsil cancer ([place])   02/01/2013 Imaging Ultrasound of the neck revealed bilateral lymphadenopathy in the submandibular region   03/16/2013 Imaging CT scan of the neck show large right tonsil mass measured 3.9 cm in maximum dimension as well as bilateral lymphadenopathy, the largest lymph node measures 31 mm. There is also a left thyroid mass measured 44 mm   03/21/2013 Procedure The patient was seen by ENT with laryngoscopy and biopsy. Pathology is pending   03/25/2013 Imaging PET scan showed large hypermetabolic soft tissue mass in the region of the right palatine tonsil with bilateral cervical hypermetabolic lymphadenopathy indicative of metastatic disease,   04/04/2013 Surgery He underwent placement of Port-A-Cath and feeding tube   04/14/2013 Procedure The patient underwent teeth extraction.   05/02/2013 - 06/13/2013 Chemotherapy The dosage of chemotherapy was interrupted many times due to side effects. His treatment is terminated early due to severe ulcerated skin toxicity.    05/02/2013 - 06/22/2013 Radiation Therapy Right tonsil and bilateral neck / 70 Gy in 35 fractions to gross disease, 63 Gy in 35 fractions to high risk nodal echelons, and 56 Gy in 35 fractions to intermediate risk nodal echelons 05/02/2013-06/22/2013    10/18/2013 Imaging PET CT scan showed complete response in the oropharynx. However, he has evidence of new bilateral pulmonary metastasis.   12/16/2013 Imaging Repeat PET CT scan show significant progression of pulmonary metastases    01/25/2014 Pathology Results Squamous Cell Carcinoma, CT guided biopsy of RUL nodule   02/08/2014 - 04/20/2014 Chemotherapy Cisplatin/Taxol weekly x 8   04/17/2014 Imaging PET Interval improvement in size and hypermetabolism of the patient's multiple bilateral pulmonary metastases. No evidence for abnormal FDG uptake in the tongue base on the current study.Stable enlargement of the left thyroid lobe.    05/04/2014 - 05/11/2014 Chemotherapy Xeloda 2000 mg in AM and 1500 mg in PM 7 days on and 7 days off   05/11/2014 Adverse Reaction Decreased appetite and increased weakness.   12/14/2014 Imaging possibility of aspiration pneumonitis.Stable subcentimeter non-hypermetabolic pulmonary nodules. No new pulmonary nodules.No metabolic evidence of local tumor recurrence in the pharynx.No metabolic evidence of recurrent nodal metastases    04/04/2015 PET scan mild pathcy/nodular opacity in medial LLL, new. infection remains favored diagnosis. tumor not excluded. increased interstitial markings with superimposed tree-in-bud nodularity/ground glass opacity in RML and BLL, chronic aspiration    HISTORY OF PRESENTING ILLNESS:  [name] 65 y.o. 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   [name] returns  to the [facility] today accompanied by his wife. He is here today to review recent PET/CT imaging.   He says he's been working outside, cutting trees, working on buildings; doing all kinds of things. He  says it's cold, but that he's still been active. [name] remarks that he feels great.  He was advised that he is likely aspirating. They have been to speech therapy before. He is trying to do his "exercises" but does not always remember. He chokes on dry foods. He chokes consistently when he does not wear his false teeth. He does not believe that he chokes while consuming liquids.   He denies fever or chills. No new pain. Mood is described as good.    MEDICAL HISTORY:  Past Medical History  Diagnosis Date  . Chest pain     10/14  . Urethral stricture     s/p dilitation  . Neuropathy (HCC)     compression neuropathy right hip;s/p replacement  . Allergy   . Fibromyalgia   . Anxiety   . Depression   . Constipation   . Arthritis     knees, HIps, Hands  . Tonsillar cancer ([place])   . Type II diabetes mellitus ([place])   . Pneumonia 2012  . Complication of anesthesia     bleeding during intubation 04/04/13 due to friability of right tonsillar cancer  . PEG (percutaneous endoscopic gastrostomy) status ([place])   . S/P radiation therapy 05/02/2013-06/22/2013    70 [facility] - Squamous Cell Carcinoma of the tonsil, p16+, T3N2cM0    SURGICAL HISTORY: Past Surgical History  Procedure Laterality Date  . Total hip arthroplasty Right 2000    Dr. Percell Miller  . Cystoscopy      Dr. Karsten Ro  . Portacath placement Right 04/04/2013  . Gastrostomy tube placement  04/04/2013  . Portacath placement N/A 04/04/2013    Procedure: INSERTION PORT-A-CATH;  Surgeon: Ralene Ok, MD;  Location: [facility];  Service: General;  Laterality: N/A;  . Laparoscopic gastrostomy N/A 04/04/2013    Procedure: LAPAROSCOPIC GASTROSTOMY TUBE PLACEMENT ;  Surgeon: Ralene Ok, MD;  Location: [facility];  Service: General;  Laterality: N/A;  . Nasal hemorrhage control N/A 04/04/2013    Procedure: Control of oropharyngeal hemorrhage;  Surgeon: Ascencion Dike, MD;  Location: [facility] OR;  Service: ENT;  Laterality: N/A;  . Multiple extractions with  alveoloplasty N/A 04/14/2013    Procedure: Extraction of tooth #'s 1,2,3,4,5,6,7,8,9,10,11,12,13,14,15,17,18,19,20,21,22,23,24,25,26,27,28,29, 30, 31, and 32 with alveoloplasty and bilateral mandibular tori reductions.;  Surgeon: Lenn Cal, DDS;  Location: [facility];  Service: Oral Surgery;  Laterality: N/A;    SOCIAL HISTORY: Social History   Social History  . Marital Status: Married    Spouse Name: N/A  . Number of Children: N/A  . Years of Education: N/A   Occupational History  . Not on file.   Social History Main Topics  . Smoking status: Never Smoker   . Smokeless tobacco: Never Used  . Alcohol Use: No     Comment: Occasional beer  . Drug Use: No  . Sexual Activity: Yes   Other Topics Concern  . Not on file   Social History Narrative  Married for 20 years. They have no children. They have a dog. He has a daughter from a prior relationship, they are estranged. No smoking, worked at [employer]. He has never smoked. Occasional alcohol.  FAMILY HISTORY: Family History  Problem Relation Age of Onset  . Hyperlipidemia Brother   . Cancer Mother  Deceased with leukemia, had uterine ca  . Cancer Cousin     living brain cancer, male   indicated that his mother is deceased. He indicated that his father is deceased. He indicated that his cousin is alive.   His mother had cervical cancer and leukemia. 1 brother who is healthy.  ALLERGIES:  is allergic to neurontin and dilaudid.  MEDICATIONS:  Current Outpatient Prescriptions  Medication Sig Dispense Refill  . acetaminophen (TYLENOL) 500 MG tablet Take 500-1,000 mg by mouth every 4 (four) hours as needed for mild pain. Reported on 12/27/2014    . alprazolam (XANAX) 2 MG tablet Take 2 mg by mouth 3 (three) times daily as needed for sleep or anxiety.     . chlorpheniramine-HYDROcodone ([place]) 10-8 MG/5ML SUER     . Cholecalciferol (VITAMIN D3) 5000 UNITS CAPS Take 5,000 Units by mouth daily.    . citalopram  (CELEXA) 20 MG tablet Take 1 tablet (20 mg total) by mouth daily. 30 tablet 1  . diphenhydrAMINE (BENADRYL) 25 mg capsule Take 1 capsule (25 mg total) by mouth every 6 (six) hours as needed for itching. 60 capsule 0  . docusate sodium (COLACE) 100 MG capsule Take 100 mg by mouth 2 (two) times daily as needed for mild constipation.    . lidocaine-prilocaine (EMLA) cream Apply a quarter size amount to port site 1 hour prior to chemo. Do not rub in. Cover with plastic wrap. 30 g 3  . nystatin (MYCOSTATIN) 100000 UNIT/ML suspension Take 5 mLs by mouth as needed. Reported on 12/27/2014    . ondansetron (ZOFRAN) 8 MG tablet TAKE (1) TABLET EVERY EIGHT HOURS AS NEEDED FOR NAUSEA. 60 tablet 0  . OxyCODONE (OXYCONTIN) 20 mg T12A 12 hr tablet Take 20 mg by mouth every 12 (twelve) hours.     [name] oxyCODONE-acetaminophen (PERCOCET) 10-325 MG per tablet Take 1 tablet by mouth every 4 (four) hours as needed for pain. 30 tablet 0  . polyethylene glycol (MIRALAX / GLYCOLAX) packet Take 17 g by mouth as needed. Reported on 12/27/2014    . pregabalin (LYRICA) 150 MG capsule Take 2 capsules (300 mg total) by mouth 2 (two) times daily. (Patient taking differently: Take 300 mg by mouth 2 (two) times daily. 2 in am and 2 at night) 60 capsule 3  . promethazine (PHENERGAN) 25 MG tablet TAKE (1) TABLET EVERY SIX HOURS AS NEEDED FOR NAUSEA. 60 tablet 0  . zolpidem (AMBIEN CR) 6.25 MG CR tablet Take 6.25 mg by mouth at bedtime as needed for sleep.    [name] amoxicillin-clavulanate (AUGMENTIN) 875-125 MG tablet Take 1 tablet by mouth 2 (two) times daily. With food 28 tablet 0   Current Facility-Administered Medications  Medication Dose Route Frequency Provider Last Rate Last Dose  . sodium chloride flush (NS) 0.9 % injection 10 mL  10 mL Intravenous PRN Patrici Ranks, MD   10 mL at 04/06/15 1417    Review of Systems  Constitutional: Negative for fever, chills, weight loss and  Positive for fatigue.  HENT: Negative for  congestion, hearing loss, nosebleeds, sore throat and tinnitus.   Eyes: Negative for blurred vision, double vision, pain and discharge.  Respiratory: Positive for cough. Negative for hemoptysis, sputum production, shortness of breath and wheezing.   Non productive Cardiovascular: Negative for chest pain, palpitations, claudication, leg swelling and PND.  Gastrointestinal: Negative for heartburn, nausea, vomiting, abdominal pain, diarrhea, constipation, blood in stool and melena.  Genitourinary: Negative for dysuria, urgency, frequency and hematuria.  Musculoskeletal: Negative for myalgias, joint pain and falls.  Skin: Negative for itching and rash.  Neurological: Negative for dizziness, tingling, tremors, sensory change, speech change, focal weakness, seizures, loss of consciousness, weakness and headaches.  Endo/Heme/Allergies: Does not bruise/bleed easily.  Psychiatric/Behavioral: Negative for depression, suicidal ideas, memory loss and substance abuse. The patient is not nervous/anxious and does not have insomnia.    14 point review of systems was performed and is negative except as detailed under history of present illness and above    PHYSICAL EXAMINATION:  ECOG PERFORMANCE STATUS: 0 - Asymptomatic  Filed Vitals:   04/06/15 1337  BP: 124/70  Pulse: 71  Temp: 97.7 F (36.5 C)  Resp: 18   Filed Weights   04/06/15 1337  Weight: 208 lb 14.4 oz (94.756 kg)    Physical Exam  Constitutional: He is oriented to person, place, and time and well-developed, well-nourished, and in no distress. He looks good. HENT:  Head: Normocephalic and atraumatic.  Nose: Nose normal.  Mouth/Throat: Oropharynx is clear and moist. No oropharyngeal exudate. Eyes: Conjunctivae and EOM are normal. Pupils are equal, round, and reactive to light. Right eye exhibits no discharge. Left eye exhibits no discharge. No scleral icterus.  Neck: Normal range of motion. Neck supple. No tracheal deviation present.  No thyromegaly present. Mild lymphedema, mild XRT changes noted Cardiovascular: Normal rate, regular rhythm and normal heart sounds.  Exam reveals no gallop and no friction rub.   No murmur heard. Pulmonary/Chest: Effort normal and breath sounds normal. He has no wheezes. He has no rales.   Abdominal: Soft. Bowel sounds are normal. He exhibits no distension and no mass. There is no tenderness. There is no rebound and no guarding.   Musculoskeletal: Normal range of motion. He exhibits no edema.  Lymphadenopathy:    He has no cervical adenopathy.  Neurological: He is alert and oriented to person, place, and time. He has normal reflexes. No cranial nerve deficit. Gait normal. Coordination normal.  Skin: Skin is warm and dry. No rash noted.  Psychiatric: Mood, memory, affect and judgment normal.  Nursing note and vitals reviewed.   LABORATORY DATA:  I have reviewed the data as listed  Results for EDRAS, [place] (MRN [id number]) as of 04/10/2015 13:44  Ref. Range 04/06/2015 14:30  Sodium Latest Ref Range: 135-145 mmol/L 139  Potassium Latest Ref Range: 3.5-5.1 mmol/L 3.8  Chloride Latest Ref Range: 101-111 mmol/L 105  CO2 Latest Ref Range: 22-32 mmol/L 27  BUN Latest Ref Range: 6-20 mg/dL 16  Creatinine Latest Ref Range: 0.61-1.24 mg/dL 0.75  Calcium Latest Ref Range: 8.9-10.3 mg/dL 8.8 (L)  EGFR (Non-African Amer.) Latest Ref Range: >60 mL/min >60  EGFR (African American) Latest Ref Range: >60 mL/min >60  Glucose Latest Ref Range: 65-99 mg/dL 87  Anion gap Latest Ref Range: 5-15  7  Alkaline Phosphatase Latest Ref Range: 38-126 U/L 75  Albumin Latest Ref Range: 3.5-5.0 g/dL 3.9  AST Latest Ref Range: 15-41 U/L 23  ALT Latest Ref Range: 17-63 U/L 15 (L)  Total Protein Latest Ref Range: 6.5-8.1 g/dL 7.8  Total Bilirubin Latest Ref Range: 0.3-1.2 mg/dL 0.6  WBC Latest Ref Range: 4.0-10.5 K/uL 4.2  RBC Latest Ref Range: 4.22-5.81 MIL/uL 4.51  Hemoglobin Latest Ref Range: 13.0-17.0 g/dL 13.0   HCT Latest Ref Range: 39.0-52.0 % 40.8  MCV Latest Ref Range: 78.0-100.0 fL 90.5  MCH Latest Ref Range: 26.0-34.0 pg 28.8  MCHC Latest Ref Range: 30.0-36.0 g/dL 31.9  RDW Latest Ref Range:  11.5-15.5 % 15.7 (H)  Platelets Latest Ref Range: 150-400 K/uL 177  Neutrophils Latest Units: % 72  Lymphocytes Latest Units: % 12  Monocytes Relative Latest Units: % 11  Eosinophil Latest Units: % 4  Basophil Latest Units: % 1  NEUT# Latest Ref Range: 1.7-7.7 K/uL 3.0  Lymphocyte # Latest Ref Range: 0.7-4.0 K/uL 0.5 (L)  Monocyte # Latest Ref Range: 0.1-1.0 K/uL 0.5  Eosinophils Absolute Latest Ref Range: 0.0-0.7 K/uL 0.2  Basophils Absolute Latest Ref Range: 0.0-0.1 K/uL 0.0    RADIOLOGY:  Study Result     CLINICAL DATA: Subsequent treatment strategy for initially stage IVA (T3 N2c M0) squamous cell carcinoma of the right tonsil diagnosed in March 2015, status post concurrent chemoradiation therapy, with subsequent lung metastases, status post additional chemotherapy.  EXAM: NUCLEAR MEDICINE PET SKULL BASE TO THIGH  TECHNIQUE: 10.8 mCi F-18 FDG was injected intravenously. Full-ring PET imaging was performed from the skull base to thigh after the radiotracer. CT data was obtained and used for attenuation correction and anatomic localization.  FASTING BLOOD GLUCOSE: Value: 86 mg/dl  COMPARISON: 09/25/2014 PET-CT.  FINDINGS: NECK  No evidence of abnormal hypermetabolism in the nasopharynx, oropharynx or hypopharynx. There is symmetric curvilinear hypermetabolism in the larynx, likely physiologic. No hypermetabolic lymph nodes in the neck. There is physiologic muscular hypermetabolism in the right temporalis muscle and bilateral scalene muscles, left greater than right. Stable multinodular goiter asymmetrically involving the left thyroid lobe, with multiple confluent hypodense partially calcified non hypermetabolic thyroid nodules in both thyroid  lobes.  CHEST  There are retained secretions throughout the right tracheal lumen. There is new mild patchy ground-glass opacity in the lingula. There is more prominent patchy ground-glass opacity in the right greater than left lower lobes. There is associated mild patchy hypermetabolism with a ground-glass opacities, for example max SUV 2.9 in the lingula, 4.7 in the central left lower lobe and 3.9 in the medial right lower lobe.  There is stable nodular pleural-parenchymal scarring at both lung apices. Stable 4 mm solid right middle lobe pulmonary nodule (series 6/image 46). Stable subpleural 4 mm superior right middle lobe pulmonary nodule (6/39). Stable 3 mm solid inferior right upper lobe pulmonary nodule (6/37). No hypermetabolic pulmonary nodules. No new significant pulmonary nodules.  No hypermetabolic axillary, mediastinal or hilar nodes. Right subclavian MediPort terminates in the lower third of the superior vena cava. Left anterior descending an left circumflex coronary atherosclerosis.  ABDOMEN/PELVIS  No abnormal hypermetabolic activity within the liver, pancreas, adrenal glands, or spleen. No hypermetabolic lymph nodes in the abdomen or pelvis. Prominently distended and non thick walled urinary bladder. No hydronephrosis.  SKELETON  No focal hypermetabolic activity to suggest skeletal metastasis. Status post right total hip arthroplasty.  IMPRESSION: 1. New patchy ground-glass opacity with associated mild hypermetabolism in the lingula and bilateral lower lobes, favor an infectious or inflammatory etiology. The presence of retained secretions throughout the tracheal lumen suggests the possibility of aspiration pneumonitis. 2. Stable subcentimeter non-hypermetabolic pulmonary nodules. No new pulmonary nodules. 3. No metabolic evidence of local tumor recurrence in the pharynx. 4. No metabolic evidence of recurrent nodal metastases in the neck. 5. No  new sites of hypermetabolic distant metastatic disease. 6. Prominent urinary bladder distention, correlate clinically for bladder voiding dysfunction. No hydronephrosis.   Electronically Signed  By: Ilona Sorrel M.D.  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 He completed therapy with Cisplatin and Taxol for his stage IV disease given weekly. He had an excellent response. His performance status is a 0-1.   PET/CT was reviewed in detail.  I will put him on Augmentin 875 twice daily for 14 days. I still suspect aspiration.  He has seen Dabney in the recent past and I will touch base with her. He will most likely need a more extensive evaluation under her guidance.   We will repeat a CT scan of his chest in 6 weeks. I will see him back post with additional recommendations to follow.   Orders Placed This Encounter  Procedures  . CT Chest W Contrast    [name]   [employer]  MEDICARE     ISTAT CREAT    Standing Status: Future     Number of Occurrences:      Standing Expiration Date: 04/05/2016    Order Specific Question:  If indicated for the ordered procedure, I authorize the administration of contrast media per Radiology protocol    Answer:  Yes    Order Specific Question:  Reason for Exam (SYMPTOM  OR DIAGNOSIS REQUIRED)    Answer:  aspiration pneumonia    Order Specific Question:  Preferred imaging location?    Answer:  [facility]    Standing Status: Future     Number of Occurrences:      Standing Expiration Date: 06/09/2016    Order Specific Question:  Reason for Exam (SYMPTOM  OR DIAGNOSIS REQUIRED)    Answer:  head and neck cancer, aspiration    Order Specific Question:   Preferred imaging location?    Answer:  [facility]  . CBC with Differential    Standing Status: Future     Number of Occurrences: 1     Standing Expiration Date: 04/05/2016  . Comprehensive metabolic panel    Standing Status: Future     Number of Occurrences: 1     Standing Expiration Date: 04/05/2016  . Ambulatory referral to Speech Therapy    Referral Priority:  Routine    Referral Type:  Speech Therapy    Referral Reason:  Specialty Services Required    Requested Specialty:  Speech Pathology    Number of Visits Requested:  1  . Schedule Portacath Flush Appointment    Schedule Portacath flush appointment  . SLP modified barium swallow    Abnormal imaging chest    Standing Status: Future     Number of Occurrences:      Standing Expiration Date: 04/09/2016    Order Specific Question:  Where should this test be performed:    Answer:  [facility]    Order Specific Question:  Please indicate reason for Referral:    Answer:  Concerned about Dysphagia/Aspiration    Order Specific Question:  Patients current diet consistency:    Answer:  Regular    Order Specific Question:  Patients current liquid consistency:    Answer:  Thin (All Liquid Allowed)    Order Specific Question:  Existing signs/symptoms of possible Aspiration/Dysphagia:    Answer:  Cough with Meals/Meds/Other P.O.s    Order Specific Question:  Other risk factors for Dysphagia:    Answer:  Recurrent history of Pneumonia   All questions were answered. The patient knows to call the clinic with any  problems, questions or concerns.  This document serves as a record of services personally performed by Ancil Linsey, MD. It was created on her behalf by Toni Amend, a trained medical scribe. The creation of this record is based on the scribe's personal observations and the provider's statements to them. This document has been checked and approved by the attending provider.  I have reviewed the above documentation  for accuracy and completeness and I agree with the above.  Kelby Fam. Whitney Muse, MD

## 2015-04-06 NOTE — Patient Instructions (Addendum)
[place] at [facility] Discharge Instructions  RECOMMENDATIONS MADE BY THE CONSULTANT AND ANY TEST RESULTS WILL BE SENT TO YOUR REFERRING PHYSICIAN.   Exam and discussion by Dr Whitney Muse today Port flush today with labs, we will let you know the results of these   Port flushes every 8 weeks  CT Chest in 6 weeks  Referral to speech therapy, they will call you with this appt  Augmentin sent to your pharmacy for 14 days.  Please make sure you take all of these ([place]) Return to see the doctor after your CT scan  Please call the clinic if you have any questions or concerns    Thank you for choosing [place] at [facility] to provide your oncology and hematology care.  To afford each patient quality time with our provider, please arrive at least 15 minutes before your scheduled appointment time.   Beginning January 23rd 2017 lab work for the [facility] will be done in the  Main lab at [facility] on 1st floor. If you have a lab appointment with the [facility] please come in thru the  Main Entrance and check in at the main information desk  You need to re-schedule your appointment should you arrive 10 or more minutes late.  We strive to give you quality time with our providers, and arriving late affects you and other patients whose appointments are after yours.  Also, if you no show three or more times for appointments you may be dismissed from the clinic at the providers discretion.     Again, thank you for choosing [facility].  Our hope is that these requests will decrease the amount of time that you wait before being seen by our physicians.       _____________________________________________________________  Should you have questions after your visit to [facility], please contact our office at [phone] between the hours of 8:30 a.m. and 4:30 p.m.  Voicemails left after 4:30 p.m. will not be  returned until the following business day.  For prescription refill requests, have your pharmacy contact our office.         Resources For Cancer Patients and their Caregivers ? American Cancer Society: Can assist with transportation, wigs, general needs, runs Look Good Feel Better.        862-628-4515 ? Cancer Care: Provides financial assistance, online support groups, medication/co-pay assistance.  1-800-813-HOPE 539-008-7462) ? [place] Assists [place] cancer patients and their families through emotional , educational and financial support.  332-042-8009 ? [place] DSS Where to apply for food stamps, Medicaid and utility assistance. 540-090-6675 ? [employer]: Transportation to medical appointments. 801-214-5832 ? Social Security Administration: May apply for disability if have a Stage IV cancer. [phone] ? [employer], Disability and Transit Services: Assists with nutrition, care and transit needs. 949-783-1129

## 2015-04-06 NOTE — Progress Notes (Signed)
[name] presented for Portacath access and flush. Portacath located rigtht chest wall accessed with  H 20 needle. Good blood return present. Portacath flushed with 50m NS and 500U/565mHeparin and needle removed intact. Procedure without incident. Patient tolerated procedure well.  Labs drawn per orders.

## 2015-04-10 DIAGNOSIS — G894 Chronic pain syndrome: Secondary | ICD-10-CM | POA: Diagnosis not present

## 2015-04-10 DIAGNOSIS — G64 Other disorders of peripheral nervous system: Secondary | ICD-10-CM | POA: Diagnosis not present

## 2015-04-10 DIAGNOSIS — Z1389 Encounter for screening for other disorder: Secondary | ICD-10-CM | POA: Diagnosis not present

## 2015-04-10 DIAGNOSIS — Z6824 Body mass index (BMI) 24.0-24.9, adult: Secondary | ICD-10-CM | POA: Diagnosis not present

## 2015-04-24 ENCOUNTER — Ambulatory Visit (HOSPITAL_COMMUNITY): Payer: Medicare Other | Admitting: Speech Pathology

## 2015-04-25 ENCOUNTER — Ambulatory Visit (HOSPITAL_COMMUNITY)
Admission: RE | Admit: 2015-04-25 | Discharge: 2015-04-25 | Disposition: A | Discharge status: Home / Self Care | Payer: Medicare Other | Source: Ambulatory Visit | Attending: Hematology & Oncology | Admitting: Hematology & Oncology

## 2015-04-25 ENCOUNTER — Ambulatory Visit (HOSPITAL_COMMUNITY): Payer: Medicare Other | Attending: Hematology & Oncology | Admitting: Speech Pathology

## 2015-04-25 DIAGNOSIS — J690 Pneumonitis due to inhalation of food and vomit: Secondary | ICD-10-CM | POA: Insufficient documentation

## 2015-04-25 DIAGNOSIS — Z923 Personal history of irradiation: Secondary | ICD-10-CM | POA: Insufficient documentation

## 2015-04-25 DIAGNOSIS — C099 Malignant neoplasm of tonsil, unspecified: Secondary | ICD-10-CM | POA: Insufficient documentation

## 2015-04-25 DIAGNOSIS — T17300A Unspecified foreign body in larynx causing asphyxiation, initial encounter: Secondary | ICD-10-CM | POA: Diagnosis not present

## 2015-04-25 DIAGNOSIS — R1312 Dysphagia, oropharyngeal phase: Secondary | ICD-10-CM | POA: Diagnosis not present

## 2015-04-25 DIAGNOSIS — C7801 Secondary malignant neoplasm of right lung: Secondary | ICD-10-CM | POA: Diagnosis not present

## 2015-04-26 NOTE — Therapy (Signed)
[place], [place], [address] Phone: [phone]   Fax:  [phone]  Modified Barium Swallow  Patient Details  Name: [name] MRN: [id number] Date of Birth: [birth year] No Data Recorded  Encounter Date: 04/25/2015      End of Session - 04/25/15 2004    Visit Number 1   Number of Visits 6   Authorization Type Medicare   SLP Start Time 0630   SLP Stop Time  1430   SLP Time Calculation (min) 45 min   Activity Tolerance Patient tolerated treatment well      Past Medical History  Diagnosis Date  . Chest pain     10/14  . Urethral stricture     s/p dilitation  . Neuropathy (HCC)     compression neuropathy right hip;s/p replacement  . Allergy   . Fibromyalgia   . Anxiety   . Depression   . Constipation   . Arthritis     knees, HIps, Hands  . Tonsillar cancer ([place])   . Type II diabetes mellitus ([place])   . Pneumonia 2012  . Complication of anesthesia     bleeding during intubation 04/04/13 due to friability of right tonsillar cancer  . PEG (percutaneous endoscopic gastrostomy) status ([place])   . 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 Total hip arthroplasty Right 2000    Dr. Percell Miller  . Cystoscopy      Dr. Karsten Ro  . Portacath placement Right 04/04/2013  . Gastrostomy tube placement  04/04/2013  . Portacath placement N/A 04/04/2013    Procedure: INSERTION PORT-A-CATH;  Surgeon: Ralene Ok, MD;  Location: [facility];  Service: General;  Laterality: N/A;  . Laparoscopic gastrostomy N/A 04/04/2013    Procedure: LAPAROSCOPIC GASTROSTOMY TUBE PLACEMENT ;  Surgeon: Ralene Ok, MD;  Location: [place];  Service: General;  Laterality: N/A;  . Nasal hemorrhage control N/A 04/04/2013    Procedure: Control of oropharyngeal hemorrhage;  Surgeon: Ascencion Dike, MD;  Location: [facility] OR;  Service: ENT;   Laterality: N/A;  . Multiple extractions with alveoloplasty N/A 04/14/2013    Procedure: Extraction of tooth #'s 1,2,3,4,5,6,7,8,9,10,11,12,13,14,15,17,18,19,20,21,22,23,24,25,26,27,28,29, 30, 31, and 32 with alveoloplasty and bilateral mandibular tori reductions.;  Surgeon: Lenn Cal, DDS;  Location: [place];  Service: Oral Surgery;  Laterality: N/A;    There were no vitals filed for this visit.      Subjective Assessment - 04/25/15 2002    Subjective "I swallow fine!"   Special Tests MBSS   Currently in Pain? 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 He completed therapy with Cisplatin and Taxol given weekly. He had an excellent response. Pt status post concurrent chemoradiation therapy, with subsequent lung metastases (diagnosed in 12/2013), status post additional chemotherapy. Pt referred to SLP for clinical swallow evaluation due to aspiration suspected given findings on PET scan in December. Pt's wife reported frequent coughing during meals when pt not wearing dentures. Both pt and wife report decreased signs/symptoms aspiration now that he wears dentures that fit well. They were seen by SLP  a couple years ago when he was undergoing treatment for tonsillar CA. Wife states that pt no longer completed exercises. Dr. Whitney Muse is concerned about likely aspiration noted on most recent PET scan and recommended MBSS to objectively evaluate swallow. Pt denies difficulty swallowing. His wife was not able to accompany him to today's appointment due to illness.    Previous Swallow Assessment none on record   Respiratory Status Room air   History of Recent Intubation No   Behavior/Cognition Alert;Cooperative;Pleasant mood   Oral Cavity - Dentition Dentures,  top;Dentures, bottom   Volitional Cough Strong   Volitional Swallow Able to elicit     PET scan 9/76/7341: IMPRESSION: Mild patchy/nodular opacity in the medial left lower lobe, new. While infection remains the favored diagnosis, tumor is not excluded. Short-term follow-up CT chest is suggested in 4-6 weeks after appropriate antimicrobial therapy.  Underlying increased interstitial markings with superimposed tree-in-bud nodularity/ ground-glass opacity in the right middle lobe and bilateral lower lobes. This appearance is favored to be related to recurrent/chronic aspiration. Tumor (lymphangitic spread) is considered unlikely. Attention on follow-up is suggested.       Oral Preparation/Oral Phase - 04/25/15 2003    Oral Preparation/Oral Phase   Oral Phase Impaired          Pharyngeal Phase - 04/25/15 2003    Pharyngeal Phase   Pharyngeal Phase Impaired   Electrical Stimulation - Pharyngeal Phase   Was Electrical Stimulation Used No          Cricopharyngeal Phase - 04/25/15 2004    Cervical Esophageal Phase   Cervical Esophageal Phase Within functional limits     Radiologist's note: Typical post radiation appearance of the epiglottis with thickened blunted appearance. Epiglottic turnover was poor and there was stasis with all consistencies. Sensation was also poor, with patient never sensing stasis, including a pill which stuck in the vallecula and had to dissolve.  Large and silent or weakly responsive aspiration both from piriform sinus filling due to spill over and during the swallowing phase. Stasis would add to the aspirated volume.  Nectar was better tolerated but there is still deep laryngeal penetration which would build-up and lead to aspiration.  Stasis and laryngeal penetration with solid consistency.        Plan - 04/25/15 2005    Clinical Impression Statement [name] presents with mild oral phase and moderate pharyngeal phase dysphagia  characterized by mildly delayed oral transit likely due to dentures, delay in swallow initiation triggered after spilling to pyriforms, decreased tongue base retraction and epiglottic deflection resulting in penetration and aspiration of thin and nectar-thick liquids during the swallow and mod/severe vallecular and posterior pharyngeal wall residue post swallow. Chin tuck was implemented in attempt to reduce aspiration, however it was ineffective. Pt tolerated nectar-thick liquids a little better as they were better sensed when penetration and aspiration occurred. Aspiration with thin liquids was silent. Cued throat clears were effective in removing aspirate 75% of the time.   The barium tablet was taken whole with puree due to increased risks for aspiration with thin liquids and barium tablet became lodged in vallecular space and could not be removed despite strategies (head turn, effortful swallow, chin tuck, alternating consistenices and textures, maskako). Unfortunately, pt has little appreciation for severity of pharyngeal dysphagia and repeated frequently that he had no difficulty swallowing despite SLP showing monitor for visual cues. SLP provided education during and following the test, however pt will need ongoing education. Recommend D3/mech soft with NTL (  nectar-thickened liquids) with a focus on oral care before and after meals. Also recommend dysphagia therapy (however pt unwilling to complete previously) to focus on pharyngeal strengthening as able (pt with s/p radiation induced changes), pt/family education, and implementation of compensatory strategies (small sips with throat clear). Again, pt does not appear to grasp the severity of his dysphagia so he will need ongoing education.     Speech Therapy Frequency 1x /week   Duration --  6 weeks   Potential to Achieve Goals Fair   Potential Considerations Ability to learn/carryover information;Cooperation/participation level;Severity of  impairments   Consulted and Agree with Plan of Care Patient      Patient will benefit from skilled therapeutic intervention in order to improve the following deficits and impairments:   Dysphagia, oropharyngeal phase      G-Codes - 05-14-2015 2005    Functional Assessment Tool Used MBSS; clinical judgment   Functional Limitations Swallowing   Swallow Current Status (F5436) At least 40 percent but less than 60 percent impaired, limited or restricted   Swallow Goal Status (G6770) At least 20 percent but less than 40 percent impaired, limited or restricted          Problem List Patient Active Problem List   Diagnosis Date Noted  . Metastasis to lung ([place]) 10/19/2013  . Mucositis (ulcerative) due to antineoplastic therapy 06/10/2013  . Protein-calorie malnutrition, severe ([place]) 06/10/2013  . Rash 06/10/2013  . Gastrostomy tube dyslodgement - replaced 05/06/2013 05/06/2013  . Gastrostomy in place [facility]) 04/04/2013  . Tonsil cancer ([place]) 03/22/2013  . Diabetic neuropathy, painful ([place]) 03/22/2013  . Diabetes mellitus, type 2 ([place]) 10/18/2012  . Other and unspecified hyperlipidemia 10/18/2012  . Obesity, unspecified 10/18/2012   Thank you,  Genene Churn, [place]  [facility] May 14, 2015, 8:06 PM  [place] [address] [place], [place], [address] Phone: [phone]   Fax:  [phone]  Name: [name] MRN: [id number] Date of Birth: 23-Mar-1950

## 2015-05-15 ENCOUNTER — Ambulatory Visit (HOSPITAL_COMMUNITY): Payer: Medicare Other

## 2015-05-15 ENCOUNTER — Encounter (HOSPITAL_COMMUNITY): Payer: Self-pay | Admitting: Speech Pathology

## 2015-05-15 ENCOUNTER — Ambulatory Visit (HOSPITAL_COMMUNITY): Payer: Medicare Other | Attending: Hematology & Oncology | Admitting: Speech Pathology

## 2015-05-15 DIAGNOSIS — R1312 Dysphagia, oropharyngeal phase: Secondary | ICD-10-CM | POA: Insufficient documentation

## 2015-05-15 NOTE — Patient Instructions (Signed)
Dysphagia 3/Mechanical Soft diet textures with nectar-thick liquids. Pt given ~30 individual samples of gel thickener. Recommend Thicken Up Clear from pharmacy

## 2015-05-15 NOTE — Therapy (Signed)
[place], [place], [address] Phone: [phone]   Fax:  [phone]  Speech Language Pathology Treatment  Patient Details  Name: [name] MRN: [id number] Date of Birth: 1950-12-24 No Data Recorded  Encounter Date: 05/15/2015      End of Session - 05/15/15 1755    Visit Number 2   Number of Visits 6   Authorization Type Medicare   SLP Start Time 9518   SLP Stop Time  1700   SLP Time Calculation (min) 55 min   Activity Tolerance Patient tolerated treatment well      Past Medical History  Diagnosis Date  . Chest pain     10/14  . Urethral stricture     s/p dilitation  . Neuropathy (HCC)     compression neuropathy right hip;s/p replacement  . Allergy   . Fibromyalgia   . Anxiety   . Depression   . Constipation   . Arthritis     knees, HIps, Hands  . Tonsillar cancer ([place])   . Type II diabetes mellitus ([place])   . Pneumonia 2012  . Complication of anesthesia     bleeding during intubation 04/04/13 due to friability of right tonsillar cancer  . PEG (percutaneous endoscopic gastrostomy) status ([place])   . 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 Total hip arthroplasty Right 2000    Dr. Percell Miller  . Cystoscopy      Dr. Karsten Ro  . Portacath placement Right 04/04/2013  . Gastrostomy tube placement  04/04/2013  . Portacath placement N/A 04/04/2013    Procedure: INSERTION PORT-A-CATH;  Surgeon: Ralene Ok, MD;  Location: [place];  Service: General;  Laterality: N/A;  . Laparoscopic gastrostomy N/A 04/04/2013    Procedure: LAPAROSCOPIC GASTROSTOMY TUBE PLACEMENT ;  Surgeon: Ralene Ok, MD;  Location: [place];  Service: General;  Laterality: N/A;  . Nasal hemorrhage control N/A 04/04/2013    Procedure: Control of oropharyngeal hemorrhage;  Surgeon: Ascencion Dike, MD;  Location: [facility] OR;   Service: ENT;  Laterality: N/A;  . Multiple extractions with alveoloplasty N/A 04/14/2013    Procedure: Extraction of tooth #'s 1,2,3,4,5,6,7,8,9,10,11,12,13,14,15,17,18,19,20,21,22,23,24,25,26,27,28,29, 30, 31, and 32 with alveoloplasty and bilateral mandibular tori reductions.;  Surgeon: Lenn Cal, DDS;  Location: [place];  Service: Oral Surgery;  Laterality: N/A;    There were no vitals filed for this visit.      Subjective Assessment - 05/15/15 1750    Subjective "I don't have any problems swallowing!"   Special Tests MBSS   Currently in Pain? 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 He completed therapy with Cisplatin and Taxol given weekly. He had an excellent response. Pt status post concurrent chemoradiation therapy, with subsequent lung metastases (diagnosed in 12/2013), status post additional chemotherapy. Pt referred to SLP for clinical swallow evaluation due to aspiration suspected given findings on PET scan in December. Pt's wife reported frequent coughing during meals when pt not wearing dentures. Both  pt and wife report decreased signs/symptoms aspiration now that he wears dentures that fit well. They were seen by SLP a couple years ago when he was undergoing treatment for tonsillar CA. Wife states that pt no longer completed exercises.    Treatment Provided   Treatment provided Dysphagia   Dysphagia Treatment   Temperature Spikes Noted No   Respiratory Status Room air   Oral Cavity - Dentition Edentulous   Treatment Methods Skilled observation;Therapeutic exercise;Compensation strategy training;Patient/caregiver education   Patient  observed directly with PO's Yes   Type of PO's observed Nectar-thick liquids   Feeding Able to feed self   Liquids provided via Cup   Pharyngeal Phase Signs & Symptoms Multiple swallows   Type of cueing Verbal;Tactile;Visual   Amount of cueing Moderate   Pain Assessment   Pain Assessment No/denies pain   Assessment / Recommendations / Plan   Plan Continue with current plan of care   Dysphagia Recommendations   Diet recommendations Dysphagia 3 (mechanical soft);Nectar-thick liquid   Liquids provided via Cup;No straw   Medication Administration Whole meds with puree   Supervision Patient able to self feed   Compensations Small sips/bites;Multiple dry swallows after each bite/sip;Clear throat intermittently   Postural Changes and/or Swallow Maneuvers Out of bed for meals;Seated upright 90 degrees;Upright 30-60 min after meal   Progression Toward Goals   Progression toward goals Progressing toward goals          SLP Education - 05/15/15 1754    Education provided Yes   Education Details Reviewed results and recommendations of MBSS; dysphagia exercises, thickening liquids   Person(s) Educated Patient   Methods Explanation;Demonstration;Verbal cues;Handout;Tactile cues   Comprehension Verbalized understanding;Verbal cues required;Need further instruction          SLP Short Term Goals - 05/15/15 1807    SLP SHORT TERM GOAL #1   Title Pt will complete pharyngeal strengthening exercises as assigned 3x/day with use of written cues as reported by pt/wife.   Baseline not completing   Time 6   Period Weeks   Status On-going   SLP SHORT TERM GOAL #2   Title Pt will implement compensatory safe swallow strategies with indirect cues from SLP/wife during trials of mech soft and NTL.   Baseline mod/max cues   Time 6   Period Weeks   Status On-going          SLP Long Term Goals - 05/15/15 1807    SLP LONG TERM GOAL #1   Title Same as short above          Plan - 05/15/15 1756     Clinical Impression Statement Mr. Klug arrived to dysphagia therapy unaccompanied (wife at home). He reports that he has been doing "very well" and staying busy. He denies difficulty swallowing. SLP asked if he had any recent imaging completed of his chest and he stated that he thought Dr. Whitney Muse had ordered "something in March", but had not been completed. I checked EPIC and noted that he was scheduled for chest CT today at 1 PM and was marked as a "no show". Pt stated that he did not know about appointment. SLP called radiology scheduling line and got an appointment for Monday, May 8 so that test could be completed prior to his visit on the 9th with Dr. Whitney Muse. SLP reviewed the results of MBSS and reiterated recommendations for D3/mech soft with NTL. Pt has not been thickening liquids and continues to deny difficulty swallowing. SLP provided 30+ additional  packets of thickener to add to his drinks and demonstrated in session with water. SLP explained that pt was "silently aspirating" thin liquids and that is why he doesn't think he has difficulties. Pt given written diagrams (anatomy) and written recommendations in addition to pharyngeal strengthening exercises. Pt performed CTAR (chin tuck against restriction), Masako, effortful swallows, tongue press, and neck rolls/stretches with max SLP cues. Pt instructed to complete exercises 3x/day, thicken liquids to nectar consistency, and practice good oral care to minimize risks for aspiration PNA. Pt has other MD appointments next week, so therapy will resume the week of May 15. Continue plan of care. Pt will likely need "buy in" from Dr. Maralyn Sago physicians regarding risks for silent aspiration and its sequelae.    Speech Therapy Frequency 1x /week   Duration --  6 weeks   Treatment/Interventions Aspiration precaution training;Pharyngeal strengthening exercises;Diet toleration management by SLP;Compensatory techniques;SLP instruction and  feedback;Patient/family education;Compensatory strategies   Potential to Achieve Goals Fair   Potential Considerations Ability to learn/carryover information;Cooperation/participation level;Severity of impairments   SLP Home Exercise Plan Pt will implement HEP as assigned to facilitate carryover of treatment strategies and recommendations in home environment   Consulted and Agree with Plan of Care Patient      Patient will benefit from skilled therapeutic intervention in order to improve the following deficits and impairments:   Dysphagia, oropharyngeal phase    Problem List Patient Active Problem List   Diagnosis Date Noted  . Metastasis to lung ([place]) 10/19/2013  . Mucositis (ulcerative) due to antineoplastic therapy 06/10/2013  . Protein-calorie malnutrition, severe ([place]) 06/10/2013  . Rash 06/10/2013  . Gastrostomy tube dyslodgement - replaced 05/06/2013 05/06/2013  . Gastrostomy in place [facility]) 04/04/2013  . Tonsil cancer ([place]) 03/22/2013  . Diabetic neuropathy, painful ([place]) 03/22/2013  . Diabetes mellitus, type 2 ([place]) 10/18/2012  . Other and unspecified hyperlipidemia 10/18/2012  . Obesity, unspecified 10/18/2012   Thank you,  Genene Churn, [place]   Genene Churn 05/15/2015, 6:08 PM  [place], [place], [address] Phone: [phone]   Fax:  [phone]   Name: Raif Chachere Biegel MRN: [id number] Date of Birth: January 16, 1950

## 2015-05-16 ENCOUNTER — Telehealth (HOSPITAL_COMMUNITY): Payer: Self-pay | Admitting: *Deleted

## 2015-05-16 NOTE — Telephone Encounter (Signed)
Per [name] scheduler, pt's wife called this morning and was sobbing almost so that she couldn't understand what she was saying. She said [name] was not happy with what the speech lady told [name] and that [name] didn't want to live like that. I have tried to call pt back but no answer and no way of leaving a message on vm because vm was full.

## 2015-05-21 ENCOUNTER — Ambulatory Visit (HOSPITAL_COMMUNITY)
Admission: RE | Admit: 2015-05-21 | Discharge: 2015-05-21 | Disposition: A | Discharge status: Home / Self Care | Payer: Medicare Other | Source: Ambulatory Visit | Attending: Hematology & Oncology | Admitting: Hematology & Oncology

## 2015-05-21 DIAGNOSIS — E048 Other specified nontoxic goiter: Secondary | ICD-10-CM | POA: Insufficient documentation

## 2015-05-21 DIAGNOSIS — C099 Malignant neoplasm of tonsil, unspecified: Secondary | ICD-10-CM | POA: Diagnosis not present

## 2015-05-21 DIAGNOSIS — J690 Pneumonitis due to inhalation of food and vomit: Secondary | ICD-10-CM | POA: Diagnosis not present

## 2015-05-21 DIAGNOSIS — C7801 Secondary malignant neoplasm of right lung: Secondary | ICD-10-CM | POA: Diagnosis not present

## 2015-05-21 DIAGNOSIS — J189 Pneumonia, unspecified organism: Secondary | ICD-10-CM | POA: Diagnosis not present

## 2015-05-21 LAB — POCT I-STAT CREATININE: CREATININE: 0.8 mg/dL (ref 0.61–1.24)

## 2015-05-21 MED ORDER — IOPAMIDOL (ISOVUE-300) INJECTION 61%
75.0000 mL | Freq: Once | INTRAVENOUS | Status: AC | PRN
  Administered 2015-05-21: 75 mL via INTRAVENOUS

## 2015-05-22 ENCOUNTER — Encounter (HOSPITAL_COMMUNITY): Payer: Medicare Other | Attending: Hematology & Oncology | Admitting: Hematology & Oncology

## 2015-05-22 VITALS — BP 111/69 | HR 90 | Temp 98.1°F | Resp 18 | Wt 208.0 lb

## 2015-05-22 DIAGNOSIS — C7801 Secondary malignant neoplasm of right lung: Secondary | ICD-10-CM | POA: Insufficient documentation

## 2015-05-22 DIAGNOSIS — C099 Malignant neoplasm of tonsil, unspecified: Secondary | ICD-10-CM | POA: Diagnosis not present

## 2015-05-22 DIAGNOSIS — Z9221 Personal history of antineoplastic chemotherapy: Secondary | ICD-10-CM | POA: Insufficient documentation

## 2015-05-22 DIAGNOSIS — C7802 Secondary malignant neoplasm of left lung: Secondary | ICD-10-CM | POA: Insufficient documentation

## 2015-05-22 DIAGNOSIS — J690 Pneumonitis due to inhalation of food and vomit: Secondary | ICD-10-CM

## 2015-05-22 DIAGNOSIS — T17908D Unspecified foreign body in respiratory tract, part unspecified causing other injury, subsequent encounter: Secondary | ICD-10-CM

## 2015-05-22 DIAGNOSIS — Z923 Personal history of irradiation: Secondary | ICD-10-CM | POA: Insufficient documentation

## 2015-05-22 MED ORDER — PROMETHAZINE HCL 25 MG PO TABS: 25.0000 mg | ORAL_TABLET | Freq: Four times a day (QID) | ORAL | Status: DC | PRN

## 2015-05-22 MED ORDER — ONDANSETRON HCL 8 MG PO TABS: 8.0000 mg | ORAL_TABLET | Freq: Three times a day (TID) | ORAL | Status: DC | PRN

## 2015-05-22 NOTE — Patient Instructions (Addendum)
[place] at [facility] Discharge Instructions  RECOMMENDATIONS MADE BY THE CONSULTANT AND ANY TEST RESULTS WILL BE SENT TO YOUR REFERRING PHYSICIAN.  Exam and discussion today with Dr. Whitney Muse. PET scan in July Dr. Whitney Muse refilled your zofran and phenergan  We will do labs when you see Dr. Whitney Muse after PET   Thank you for choosing [place] at [facility] to provide your oncology and hematology care.  To afford each patient quality time with our provider, please arrive at least 15 minutes before your scheduled appointment time.   Beginning January 23rd 2017 lab work for the [facility] will be done in the  Main lab at [facility] on 1st floor. If you have a lab appointment with the [facility] please come in thru the  Main Entrance and check in at the main information desk  You need to re-schedule your appointment should you arrive 10 or more minutes late.  We strive to give you quality time with our providers, and arriving late affects you and other patients whose appointments are after yours.  Also, if you no show three or more times for appointments you may be dismissed from the clinic at the providers discretion.     Again, thank you for choosing [facility].  Our hope is that these requests will decrease the amount of time that you wait before being seen by our physicians.       _____________________________________________________________  Should you have questions after your visit to [facility], please contact our office at [phone] between the hours of 8:30 a.m. and 4:30 p.m.  Voicemails left after 4:30 p.m. will not be returned until the following business day.  For prescription refill requests, have your pharmacy contact our office.         Resources For Cancer Patients and their Caregivers ? American Cancer Society: Can assist with transportation, wigs, general needs, runs Look Good Feel  Better.        (475)383-1502 ? Cancer Care: Provides financial assistance, online support groups, medication/co-pay assistance.  1-800-813-HOPE 775 754 3836) ? [facility] [place] Assists [place] cancer patients and their families through emotional , educational and financial support.  628 024 4036 ? [place] DSS Where to apply for food stamps, Medicaid and utility assistance. 630-395-6312 ? [facility]: Transportation to medical appointments. 917-729-7742 ? Social Security Administration: May apply for disability if have a Stage IV cancer. (340)015-2749 937-320-4815 ? [employer], Disability and Transit Services: Assists with nutrition, care and transit needs. [facility] Support Programs: '@10RELATIVEDAYS'$ @ > Cancer Support Group  2nd Tuesday of the month 1pm-2pm, Journey Room  > Creative Journey  3rd Tuesday of the month 1130am-1pm, Journey Room  > Look Good Feel Better  1st Wednesday of the month 10am-12 noon, Journey Room (Call [facility] to register [phone])

## 2015-05-22 NOTE — Progress Notes (Signed)
[place] at [facility] NOTE  Patient Care Team: Sharilyn Sites, MD as PCP - General (Family Medicine) Leota Sauers, RN as Registered Nurse (Oncology) Heath Lark, MD as Consulting Physician (Hematology and Oncology) Eppie Gibson, MD as Attending Physician (Radiation Oncology) Patrici Ranks, MD as Consulting Physician (Internal Medicine)  Diagnosis: Squamous Cell Carcinoma of the right tonsil, p16+, T3N2cM0, Stage IVA  Right tonsil and bilateral neck / 70 Gy in 35 fractions to gross disease, 63 Gy in 35 fractions to high risk nodal echelons, and 56 Gy in 35 fractions to intermediate risk nodal echelons 05/02/2013-06/22/2013   Oncology History   Tonsil cancer, HPV positive   Primary site: Pharynx - Oropharynx (Right)   Staging method: AJCC 7th Edition   Clinical free text: HPV positive   Clinical: Stage IVA (T3, N2c, M0) signed by Heath Lark, MD on 04/20/2013  9:29 PM   Summary: Stage IVA (T3, N2c, M0)       Tonsil cancer ([place])   02/01/2013 Imaging Ultrasound of the neck revealed bilateral lymphadenopathy in the submandibular region   03/16/2013 Imaging CT scan of the neck show large right tonsil mass measured 3.9 cm in maximum dimension as well as bilateral lymphadenopathy, the largest lymph node measures 31 mm. There is also a left thyroid mass measured 44 mm   03/21/2013 Procedure The patient was seen by ENT with laryngoscopy and biopsy. Pathology is pending   03/25/2013 Imaging PET scan showed large hypermetabolic soft tissue mass in the region of the right palatine tonsil with bilateral cervical hypermetabolic lymphadenopathy indicative of metastatic disease,   04/04/2013 Surgery He underwent placement of Port-A-Cath and feeding tube   04/14/2013 Procedure The patient underwent teeth extraction.   05/02/2013 - 06/13/2013 Chemotherapy The dosage of chemotherapy was interrupted many times due to side effects. His treatment is terminated early due to severe ulcerated  skin toxicity.   05/02/2013 - 06/22/2013 Radiation Therapy Right tonsil and bilateral neck / 70 Gy in 35 fractions to gross disease, 63 Gy in 35 fractions to high risk nodal echelons, and 56 Gy in 35 fractions to intermediate risk nodal echelons 05/02/2013-06/22/2013    10/18/2013 Imaging PET CT scan showed complete response in the oropharynx. However, he has evidence of new bilateral pulmonary metastasis.   12/16/2013 Imaging Repeat PET CT scan show significant progression of pulmonary metastases    01/25/2014 Pathology Results Squamous Cell Carcinoma, CT guided biopsy of RUL nodule   02/08/2014 - 04/20/2014 Chemotherapy Cisplatin/Taxol weekly x 8   04/17/2014 Imaging PET Interval improvement in size and hypermetabolism of the patient's multiple bilateral pulmonary metastases. No evidence for abnormal FDG uptake in the tongue base on the current study.Stable enlargement of the left thyroid lobe.    05/04/2014 - 05/11/2014 Chemotherapy Xeloda 2000 mg in AM and 1500 mg in PM 7 days on and 7 days off   05/11/2014 Adverse Reaction Decreased appetite and increased weakness.   12/14/2014 Imaging possibility of aspiration pneumonitis.Stable subcentimeter non-hypermetabolic pulmonary nodules. No new pulmonary nodules.No metabolic evidence of local tumor recurrence in the pharynx.No metabolic evidence of recurrent nodal metastases    04/04/2015 PET scan mild pathcy/nodular opacity in medial LLL, new. infection remains favored diagnosis. tumor not excluded. increased interstitial markings with superimposed tree-in-bud nodularity/ground glass opacity in RML and BLL, chronic aspiration   04/25/2015 Imaging Modified barium swallow, post XRT appearance of epiglottis with thickened blunted appearance. epiglottic turnover poor, large and silent or weakly responvie aspiration    05/22/2015  Imaging CT chest, marked improvement in bibasilar airspace nodular opacities c/w resolving pulm infection, sub cm nodules unchanged    HISTORY OF  PRESENTING ILLNESS:  [name] 65 y.o. 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 He has been diagnosed with aspiration and apparently is not willing to adapt to recommended dietary changes.   [name] returns to the [facility] today accompanied by his wife.  He has been refusing to eat pureed food, and his diet is a modified puree. He says "I'm not gonna grind my food up; the [facility] can come get me."  His wife reports that he's been saying he's giving up and he's done, and [name] repeats "I ain't grinding my food up."  He says he doesn't want to eat baby food because he was a baby 52 years ago and is an adult now. He is willing to thicken his drinks, and his wife is willing to keep chopping up his food really fine. He is also willing to use other methods to thicken or give his food more consistency, like putting buttermilk on his cornbread to make it easier to swallow. He said he's able to eat biscuits with gravy.  At the end of his appointment he says "okay I'll try; that's the best that I can do." His wife says "you're gonna get up out of that bed and start living life," especially since tomorrow's her birthday.  He again denies choking, coughing, fever or chills. He is here to review CT scans   MEDICAL HISTORY:  Past Medical History  Diagnosis Date  . Chest pain     10/14  . Urethral stricture     s/p dilitation  . Neuropathy (HCC)     compression neuropathy right hip;s/p replacement  . Allergy   . Fibromyalgia   . Anxiety   . Depression   . Constipation   . Arthritis     knees, HIps, Hands  . Tonsillar cancer ([place])   . Type II diabetes mellitus ([place])   . Pneumonia 2012  . Complication of anesthesia     bleeding during intubation 04/04/13 due to friability of right tonsillar cancer  . PEG (percutaneous endoscopic gastrostomy) status ([place])   . S/P radiation therapy 05/02/2013-06/22/2013    70 [place] - Squamous Cell Carcinoma of the tonsil,  p16+, T3N2cM0    SURGICAL HISTORY: Past Surgical History  Procedure Laterality Date  . Total hip arthroplasty Right 2000    Dr. Percell Miller  . Cystoscopy      Dr. Karsten Ro  . Portacath placement Right 04/04/2013  . Gastrostomy tube placement  04/04/2013  . Portacath placement N/A 04/04/2013    Procedure: INSERTION PORT-A-CATH;  Surgeon: Ralene Ok, MD;  Location: [facility];  Service: General;  Laterality: N/A;  . Laparoscopic gastrostomy N/A 04/04/2013    Procedure: LAPAROSCOPIC GASTROSTOMY TUBE PLACEMENT ;  Surgeon: Ralene Ok, MD;  Location: [facility];  Service: General;  Laterality: N/A;  . Nasal hemorrhage control N/A 04/04/2013    Procedure: Control of oropharyngeal hemorrhage;  Surgeon: Ascencion Dike, MD;  Location: [facility] OR;  Service: ENT;  Laterality: N/A;  . Multiple extractions with alveoloplasty N/A 04/14/2013    Procedure: Extraction of tooth #'s 1,2,3,4,5,6,7,8,9,10,11,12,13,14,15,17,18,19,20,21,22,23,24,25,26,27,28,29, 30, 31, and 32 with alveoloplasty and bilateral mandibular tori reductions.;  Surgeon: Lenn Cal, DDS;  Location: [facility];  Service: Oral Surgery;  Laterality: N/A;    SOCIAL HISTORY: Social History   Social History  .  Marital Status: Married    Spouse Name: N/A  . Number of Children: N/A  . Years of Education: N/A   Occupational History  . Not on file.   Social History Main Topics  . Smoking status: Never Smoker   . Smokeless tobacco: Never Used  . Alcohol Use: No     Comment: Occasional beer  . Drug Use: No  . Sexual Activity: Yes   Other Topics Concern  . Not on file   Social History Narrative  Married for 20 years. They have no children. They have a dog. He has a daughter from a prior relationship, they are estranged. No smoking, worked at [employer]. He has never smoked. Occasional alcohol.  FAMILY HISTORY: Family History  Problem Relation Age of Onset  . Hyperlipidemia Brother   . Cancer Mother     Deceased with leukemia, had uterine  ca  . Cancer Cousin     living brain cancer, male   indicated that his mother is deceased. He indicated that his father is deceased. He indicated that his cousin is alive.   His mother had cervical cancer and leukemia. 1 brother who is healthy.  ALLERGIES:  is allergic to neurontin and dilaudid.  MEDICATIONS:  Current Outpatient Prescriptions  Medication Sig Dispense Refill  . acetaminophen (TYLENOL) 500 MG tablet Take 500-1,000 mg by mouth every 4 (four) hours as needed for mild pain. Reported on 12/27/2014    . alprazolam (XANAX) 2 MG tablet Take 2 mg by mouth 3 (three) times daily as needed for sleep or anxiety.     . diphenhydrAMINE (BENADRYL) 25 mg capsule Take 1 capsule (25 mg total) by mouth every 6 (six) hours as needed for itching. 60 capsule 0  . lidocaine-prilocaine (EMLA) cream Apply a quarter size amount to port site 1 hour prior to chemo. Do not rub in. Cover with plastic wrap. 30 g 3  . nystatin (MYCOSTATIN) 100000 UNIT/ML suspension Take 5 mLs by mouth as needed. Reported on 12/27/2014    . ondansetron (ZOFRAN) 8 MG tablet Take 1 tablet (8 mg total) by mouth every 8 (eight) hours as needed for nausea or vomiting. 60 tablet 0  . OxyCODONE (OXYCONTIN) 20 mg T12A 12 hr tablet Take 20 mg by mouth every 12 (twelve) hours.     [name] oxyCODONE-acetaminophen (PERCOCET) 10-325 MG per tablet Take 1 tablet by mouth every 4 (four) hours as needed for pain. 30 tablet 0  . pregabalin (LYRICA) 150 MG capsule Take 2 capsules (300 mg total) by mouth 2 (two) times daily. (Patient taking differently: Take 300 mg by mouth 2 (two) times daily. 2 in am and 2 at night) 60 capsule 3  . promethazine (PHENERGAN) 25 MG tablet Take 1 tablet (25 mg total) by mouth every 6 (six) hours as needed for nausea or vomiting. 60 tablet 0  . Cholecalciferol (VITAMIN D3) 5000 UNITS CAPS Take 5,000 Units by mouth daily. Reported on 05/22/2015    . citalopram (CELEXA) 20 MG tablet Take 1 tablet (20 mg total) by mouth daily.  (Patient not taking: Reported on 05/22/2015) 30 tablet 1  . docusate sodium (COLACE) 100 MG capsule Take 100 mg by mouth 2 (two) times daily as needed for mild constipation. Reported on 05/22/2015    . polyethylene glycol (MIRALAX / GLYCOLAX) packet Take 17 g by mouth as needed. Reported on 05/22/2015    . zolpidem (AMBIEN CR) 6.25 MG CR tablet Take 6.25 mg by mouth at bedtime as needed for  sleep. Reported on 05/22/2015     No current facility-administered medications for this visit.    Review of Systems  Constitutional: Negative for fever, chills, weight loss and  Positive for fatigue.  HENT: Negative for congestion, hearing loss, nosebleeds, sore throat and tinnitus.   Eyes: Negative for blurred vision, double vision, pain and discharge.  Respiratory:  Negative for hemoptysis, sputum production, shortness of breath and wheezing.   Cardiovascular: Negative for chest pain, palpitations, claudication, leg swelling and PND.  Gastrointestinal: Negative for heartburn, nausea, vomiting, abdominal pain, diarrhea, constipation, blood in stool and melena.  Genitourinary: Negative for dysuria, urgency, frequency and hematuria.  Musculoskeletal: Negative for myalgias, joint pain and falls.  Skin: Negative for itching and rash.  Neurological: Negative for dizziness, tingling, tremors, sensory change, speech change, focal weakness, seizures, loss of consciousness, weakness and headaches.  Endo/Heme/Allergies: Does not bruise/bleed easily.  Psychiatric/Behavioral: Negative for depression, suicidal ideas, memory loss and substance abuse. The patient is not nervous/anxious and does not have insomnia.    14 point review of systems was performed and is negative except as detailed under history of present illness and above   PHYSICAL EXAMINATION:  ECOG PERFORMANCE STATUS: 0 - Asymptomatic  Filed Vitals:   05/22/15 1310  BP: 111/69  Pulse: 90  Temp: 98.1 F (36.7 C)  Resp: 18   Filed Weights   05/22/15  1310  Weight: 208 lb (94.348 kg)    Physical Exam  Constitutional: He is oriented to person, place, and time and well-developed, well-nourished, and in no distress. He looks good. HENT:  Head: Normocephalic and atraumatic.  Nose: Nose normal.  Mouth/Throat: Oropharynx is clear and moist. No oropharyngeal exudate. Eyes: Conjunctivae and EOM are normal. Pupils are equal, round, and reactive to light. Right eye exhibits no discharge. Left eye exhibits no discharge. No scleral icterus.  Neck: Normal range of motion. Neck supple. No tracheal deviation present. No thyromegaly present. Mild lymphedema, mild XRT changes noted Cardiovascular: Normal rate, regular rhythm and normal heart sounds.  Exam reveals no gallop and no friction rub.   No murmur heard. Pulmonary/Chest: Effort normal and breath sounds normal. He has no wheezes. He has no rales.   Abdominal: Soft. Bowel sounds are normal. He exhibits no distension and no mass. There is no tenderness. There is no rebound and no guarding.   Musculoskeletal: Normal range of motion. He exhibits no edema.  Lymphadenopathy:    He has no cervical adenopathy.  Neurological: He is alert and oriented to person, place, and time. He has normal reflexes. No cranial nerve deficit. Gait normal. Coordination normal.  Skin: Skin is warm and dry. No rash noted.  Psychiatric: Mood, memory, affect and judgment normal.  Nursing note and vitals reviewed.   LABORATORY DATA:  I have reviewed the data as listed  CBC    Component Value Date/Time   WBC 4.2 04/06/2015 1430   WBC 5.6 12/16/2013 1304   RBC 4.51 04/06/2015 1430   RBC 4.66 12/16/2013 1304   HGB 13.0 04/06/2015 1430   HGB 13.9 12/16/2013 1304   HCT 40.8 04/06/2015 1430   HCT 42.4 12/16/2013 1304   PLT 177 04/06/2015 1430   PLT 167 12/16/2013 1304   MCV 90.5 04/06/2015 1430   MCV 91.0 12/16/2013 1304   MCH 28.8 04/06/2015 1430   MCH 29.8 12/16/2013 1304   MCHC 31.9 04/06/2015 1430   MCHC 32.8  12/16/2013 1304   RDW 15.7* 04/06/2015 1430   RDW 15.3* 12/16/2013 1304  LYMPHSABS 0.5* 04/06/2015 1430   LYMPHSABS 0.6* 12/16/2013 1304   MONOABS 0.5 04/06/2015 1430   MONOABS 0.6 12/16/2013 1304   EOSABS 0.2 04/06/2015 1430   EOSABS 0.2 12/16/2013 1304   BASOSABS 0.0 04/06/2015 1430   BASOSABS 0.1 12/16/2013 1304   CMP     Component Value Date/Time   NA 139 04/06/2015 1430   NA 142 12/16/2013 1305   K 3.8 04/06/2015 1430   K 4.7 12/16/2013 1305   CL 105 04/06/2015 1430   CO2 27 04/06/2015 1430   CO2 32* 12/16/2013 1305   GLUCOSE 87 04/06/2015 1430   GLUCOSE 86 12/16/2013 1305   BUN 16 04/06/2015 1430   BUN 16.6 12/16/2013 1305   CREATININE 0.80 05/21/2015 1719   CREATININE 0.8 12/16/2013 1305   CALCIUM 8.8* 04/06/2015 1430   CALCIUM 9.9 12/16/2013 1305   PROT 7.8 04/06/2015 1430   PROT 8.0 12/16/2013 1305   ALBUMIN 3.9 04/06/2015 1430   ALBUMIN 4.0 12/16/2013 1305   AST 23 04/06/2015 1430   AST 20 12/16/2013 1305   ALT 15* 04/06/2015 1430   ALT 13 12/16/2013 1305   ALKPHOS 75 04/06/2015 1430   ALKPHOS 84 12/16/2013 1305   BILITOT 0.6 04/06/2015 1430   BILITOT 0.45 12/16/2013 1305   GFRNONAA >60 04/06/2015 1430   GFRAA >60 04/06/2015 1430   Results for JAHMAI, FINELLI (MRN [id number]) as of 05/22/2015 13:22  Ref. Range 05/21/2015 17:19  Creatinine Latest Ref Range: 0.61-1.24 mg/dL 0.80     RADIOLOGY:  Study Result     CLINICAL DATA: Six week follow-up aspiration pneumonia. History of head neck cancer with lung metastasis.  EXAM: CT CHEST WITH CONTRAST  TECHNIQUE: Multidetector CT imaging of the chest was performed during intravenous contrast administration.  CONTRAST: 25m ISOVUE-300 IOPAMIDOL (ISOVUE-300) INJECTION 61%  COMPARISON: PET-CT 04/04/2015  FINDINGS: Mediastinum/Nodes: No axillary supraclavicular lymphadenopathy. Nodule enlargement of the LEFT lobe of thyroid gland is unchanged. No mediastinal lymphadenopathy. Esophagus is  normal.  Lungs/Pleura: Interval clearing of the bibasilar nodular airspace opacities. Minimal residual fine nodularity remains.  There is stable biapical nodular densities. 3 mm RIGHT upper lobe nodule is unchanged. 5 mm RIGHT middle lobe nodule (image 109, series 3) is unchanged. Additional small scattered nodules similar. No clear new nodularity.  Upper abdomen: Limited view of the liver, kidneys, pancreas are unremarkable. Normal adrenal glands.  Musculoskeletal: No aggressive osseous lesion.  IMPRESSION: 1. Marked improvement in bibasilar airspace nodular opacities consistent with resolving pulmonary infection. 2. Scattered sub cm nodules throughout the lungs are unchanged. 3. 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   Electronically Signed  By: SSuzy BouchardM.D.  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 He completed therapy with Cisplatin and Taxol for his stage IV disease given weekly. He had an excellent response. His performance status is a 0-1.   I reviewed his CT which shows overall improvement. I emphasized the seriousness of aspiration. I strongly encouraged him to continue with the exercises provided by speech pathology and the recommendations regarding nutrition.   From speech pathology: SLP explained that pt was "silently aspirating" thin liquids and that is why he doesn't think he has difficulties. Pt given written diagrams (anatomy) and written recommendations in addition to pharyngeal  strengthening exercises. Pt performed CTAR (chin tuck against restriction), [place], effortful swallows, tongue press, and neck rolls/stretches with max SLP cues. Pt instructed to complete exercises 3x/day,  thicken liquids to nectar consistency, and practice good oral care to minimize risks for aspiration PNA. Pt has other MD appointments next week, so therapy will resume the week of May 15. Continue plan of care. Pt will likely need "buy in" from Dr. Maralyn Sago physicians regarding risks for silent aspiration and its sequelae.   He is currently agreeable to thickening his liquids. He will finely chop foods. He notes "I am not going to eat baby food." I again reiterated at least trying pureed, taste is unchanged. I will have social work discuss with the patient as well.   He will be due for repeat PET in July. I will see him back post.   Orders Placed This Encounter  Procedures  . NM PET Image Restag (PS) Skull Base To Thigh    Standing Status: Future     Number of Occurrences:      Standing Expiration Date: 05/21/2016    Order Specific Question:  Reason for Exam (SYMPTOM  OR DIAGNOSIS REQUIRED)    Answer:  restage head and neck cancer    Order Specific Question:  Preferred imaging location?    Answer:  [facility]    Order Specific Question:  If indicated for the ordered procedure, I authorize the administration of a radiopharmaceutical per Radiology protocol    Answer:  Yes   All questions were answered. The patient knows to call the clinic with any problems, questions or concerns.  This document serves as a record of services personally performed by Ancil Linsey, MD. It was created on her behalf by Toni Amend, a trained medical scribe. The creation of this record is based on the scribe's personal observations and the provider's statements to them. This document has been checked and approved by the attending provider.  I have reviewed the above documentation for accuracy and completeness and I agree with the above.  K[place] Fam. Whitney Muse, MD

## 2015-05-23 ENCOUNTER — Encounter (HOSPITAL_COMMUNITY): Payer: Self-pay | Admitting: Hematology & Oncology

## 2015-05-28 ENCOUNTER — Other Ambulatory Visit (HOSPITAL_COMMUNITY): Payer: Self-pay

## 2015-05-28 ENCOUNTER — Ambulatory Visit (HOSPITAL_COMMUNITY): Payer: Medicare Other | Admitting: Speech Pathology

## 2015-05-29 ENCOUNTER — Telehealth (HOSPITAL_COMMUNITY): Payer: Self-pay | Admitting: Speech Pathology

## 2015-05-31 ENCOUNTER — Encounter (HOSPITAL_BASED_OUTPATIENT_CLINIC_OR_DEPARTMENT_OTHER): Payer: Medicare Other

## 2015-05-31 ENCOUNTER — Encounter (HOSPITAL_COMMUNITY): Payer: Self-pay

## 2015-05-31 VITALS — BP 95/66 | HR 79 | Temp 97.6°F | Resp 20

## 2015-05-31 DIAGNOSIS — Z452 Encounter for adjustment and management of vascular access device: Secondary | ICD-10-CM

## 2015-05-31 DIAGNOSIS — C7802 Secondary malignant neoplasm of left lung: Secondary | ICD-10-CM | POA: Diagnosis not present

## 2015-05-31 DIAGNOSIS — Z923 Personal history of irradiation: Secondary | ICD-10-CM | POA: Diagnosis not present

## 2015-05-31 DIAGNOSIS — C7801 Secondary malignant neoplasm of right lung: Secondary | ICD-10-CM | POA: Diagnosis not present

## 2015-05-31 DIAGNOSIS — Z9221 Personal history of antineoplastic chemotherapy: Secondary | ICD-10-CM | POA: Diagnosis not present

## 2015-05-31 DIAGNOSIS — C099 Malignant neoplasm of tonsil, unspecified: Secondary | ICD-10-CM

## 2015-05-31 LAB — COMPREHENSIVE METABOLIC PANEL
ALBUMIN: 3.9 g/dL (ref 3.5–5.0)
ALK PHOS: 74 U/L (ref 38–126)
ALT: 15 U/L — AB (ref 17–63)
AST: 24 U/L (ref 15–41)
Anion gap: 6 (ref 5–15)
BILIRUBIN TOTAL: 0.4 mg/dL (ref 0.3–1.2)
BUN: 21 mg/dL — AB (ref 6–20)
CALCIUM: 9 mg/dL (ref 8.9–10.3)
CO2: 29 mmol/L (ref 22–32)
CREATININE: 0.75 mg/dL (ref 0.61–1.24)
Chloride: 103 mmol/L (ref 101–111)
GFR calc Af Amer: >60 mL/min (ref >60)
GLUCOSE: 87 mg/dL (ref 65–99)
Potassium: 3.7 mmol/L (ref 3.5–5.1)
Sodium: 138 mmol/L (ref 135–145)
TOTAL PROTEIN: 8.1 g/dL (ref 6.5–8.1)

## 2015-05-31 LAB — CBC WITH DIFFERENTIAL/PLATELET
BASOS ABS: 0 10*3/uL (ref 0.0–0.1)
BASOS PCT: 1 %
Eosinophils Absolute: 0.2 10*3/uL (ref 0.0–0.7)
Eosinophils Relative: 4 %
HEMATOCRIT: 40.9 % (ref 39.0–52.0)
HEMOGLOBIN: 13 g/dL (ref 13.0–17.0)
LYMPHS PCT: 16 %
Lymphs Abs: 0.7 10*3/uL (ref 0.7–4.0)
MCH: 28.8 pg (ref 26.0–34.0)
MCHC: 31.8 g/dL (ref 30.0–36.0)
MCV: 90.5 fL (ref 78.0–100.0)
MONO ABS: 0.3 10*3/uL (ref 0.1–1.0)
Monocytes Relative: 8 %
NEUTROS ABS: 2.9 10*3/uL (ref 1.7–7.7)
NEUTROS PCT: 71 %
Platelets: 237 10*3/uL (ref 150–400)
RBC: 4.52 MIL/uL (ref 4.22–5.81)
RDW: 15.1 % (ref 11.5–15.5)
WBC: 4.1 10*3/uL (ref 4.0–10.5)

## 2015-05-31 MED ORDER — SODIUM CHLORIDE 0.9% FLUSH
10.0000 mL | INTRAVENOUS | Status: DC | PRN
  Administered 2015-05-31: 10 mL via INTRAVENOUS
  Filled 2015-05-31: qty 10

## 2015-05-31 MED ORDER — HEPARIN SOD (PORK) LOCK FLUSH 100 UNIT/ML IV SOLN
500.0000 [IU] | Freq: Once | INTRAVENOUS | Status: AC
  Administered 2015-05-31: 500 [IU] via INTRAVENOUS

## 2015-05-31 NOTE — Progress Notes (Signed)
[name] presented for Portacath access and flush.  Portacath located right chest wall accessed with  H 20 needle.  Good blood return present. Portacath flushed with 49m NS and 500U/558mHeparin and needle removed intact.  Procedure tolerated well and without incident.

## 2015-05-31 NOTE — Patient Instructions (Signed)
[name] at [facility] Discharge Instructions  RECOMMENDATIONS MADE BY THE CONSULTANT AND ANY TEST RESULTS WILL BE SENT TO YOUR REFERRING PHYSICIAN.  Port flush today. Return as scheduled for office visit and port flush.  Thank you for choosing [facility] to provide your oncology and hematology care.  To afford each patient quality time with our provider, please arrive at least 15 minutes before your scheduled appointment time.   Beginning January 23rd 2017 lab work for the [facility] will be done in the  Main lab at [facility] on 1st floor. If you have a lab appointment with the [facility] please come in thru the  Main Entrance and check in at the main information desk  You need to re-schedule your appointment should you arrive 10 or more minutes late.  We strive to give you quality time with our providers, and arriving late affects you and other patients whose appointments are after yours.  Also, if you no show three or more times for appointments you may be dismissed from the clinic at the providers discretion.     Again, thank you for choosing [facility].  Our hope is that these requests will decrease the amount of time that you wait before being seen by our physicians.       _____________________________________________________________  Should you have questions after your visit to [facility], please contact our office at [phone] between the hours of 8:30 a.m. and 4:30 p.m.  Voicemails left after 4:30 p.m. will not be returned until the following business day.  For prescription refill requests, have your pharmacy contact our office.         Resources For Cancer Patients and their Caregivers ? American Cancer Society: Can assist with transportation, wigs, general needs, runs Look Good Feel Better.        (843)072-0810 ? Cancer Care: Provides financial assistance, online support  groups, medication/co-pay assistance.  1-800-813-HOPE (765) 318-1206) ? [place] Assists [place] cancer patients and their families through emotional , educational and financial support.  (325)032-3506 ? [place] DSS Where to apply for food stamps, Medicaid and utility assistance. (540)371-2550 ? [facility]: Transportation to medical appointments. 682 298 0530 ? Social Security Administration: May apply for disability if have a Stage IV cancer. 867-264-3483 361-354-1283 ? [employer], Disability and Transit Services: Assists with nutrition, care and transit needs. [place] Support Programs: '@10RELATIVEDAYS'$ @ > Cancer Support Group  2nd Tuesday of the month 1pm-2pm, Journey Room  > Creative Journey  3rd Tuesday of the month 1130am-1pm, Journey Room  > Look Good Feel Better  1st Wednesday of the month 10am-12 noon, Journey Room (Call [place] to register [phone])

## 2015-06-01 ENCOUNTER — Other Ambulatory Visit (HOSPITAL_COMMUNITY): Payer: Self-pay

## 2015-06-04 ENCOUNTER — Encounter (HOSPITAL_COMMUNITY): Payer: Self-pay | Admitting: Speech Pathology

## 2015-06-04 ENCOUNTER — Telehealth (HOSPITAL_COMMUNITY): Payer: Self-pay | Admitting: Speech Pathology

## 2015-06-04 ENCOUNTER — Encounter (HOSPITAL_COMMUNITY): Payer: Self-pay | Admitting: *Deleted

## 2015-06-04 NOTE — Telephone Encounter (Signed)
Speech Pathology  I called to remind [name] of his SLP appointment this afternoon and spoke with is wife. She stated that [name] would not be able to come today because their dog was sick and he was up all night with the dog. I rescheduled him for this Thursday afternoon and asked that she come to the appointment as well. Apparently, [name] was very upset after our last session (he did not show up for his appointment last week) because he thought he had to puree all of his foods. I sent home written information regarding a D3/mechanical soft diet with NTL and packets of thickener during our last session. Will review again with pt/wife this Thursday.  Thank you,  Genene Churn, CCC-SLP [phone]

## 2015-06-07 ENCOUNTER — Ambulatory Visit (HOSPITAL_COMMUNITY): Payer: Medicare Other | Admitting: Speech Pathology

## 2015-06-07 ENCOUNTER — Telehealth (HOSPITAL_COMMUNITY): Payer: Self-pay | Admitting: Speech Pathology

## 2015-06-07 NOTE — Telephone Encounter (Signed)
Speech Pathology  [name] did not show for his scheduled SLP session again today. He missed his appointment on Monday as well. Voice mail picked up but mailbox was full so I was unable to leave a message. Pt has an appointment next Thursday, June 1 at 2:30 PM.  Thank you,  Genene Churn, [place] [phone]

## 2015-06-14 ENCOUNTER — Telehealth (HOSPITAL_COMMUNITY): Payer: Self-pay | Admitting: Speech Pathology

## 2015-06-14 ENCOUNTER — Ambulatory Visit (HOSPITAL_COMMUNITY): Payer: Medicare Other | Attending: Hematology & Oncology | Admitting: Speech Pathology

## 2015-06-14 NOTE — Telephone Encounter (Signed)
Speech Pathology  Pt did not show for his 2:30 PM SLP appointment. I called his home phone and cell phone and received no answer and was unable to leave a voicemail due to "mailbox not set" and "mailbox full".  Thank you,  Genene Churn, CCC-SLP [phone]

## 2015-07-25 ENCOUNTER — Ambulatory Visit (HOSPITAL_COMMUNITY)
Admission: RE | Admit: 2015-07-25 | Discharge: 2015-07-25 | Disposition: A | Discharge status: Home / Self Care | Payer: Medicare Other | Source: Ambulatory Visit | Attending: Hematology & Oncology | Admitting: Hematology & Oncology

## 2015-07-25 DIAGNOSIS — C7800 Secondary malignant neoplasm of unspecified lung: Secondary | ICD-10-CM | POA: Diagnosis not present

## 2015-07-25 DIAGNOSIS — R918 Other nonspecific abnormal finding of lung field: Secondary | ICD-10-CM | POA: Insufficient documentation

## 2015-07-25 DIAGNOSIS — C099 Malignant neoplasm of tonsil, unspecified: Secondary | ICD-10-CM

## 2015-07-25 DIAGNOSIS — C7801 Secondary malignant neoplasm of right lung: Secondary | ICD-10-CM | POA: Diagnosis not present

## 2015-07-25 LAB — GLUCOSE, CAPILLARY: Glucose-Capillary: 91 mg/dL (ref 65–99)

## 2015-07-25 MED ORDER — FLUDEOXYGLUCOSE F - 18 (FDG) INJECTION
10.3700 | Freq: Once | INTRAVENOUS | Status: AC | PRN
  Administered 2015-07-25: 10.37 via INTRAVENOUS

## 2015-07-26 ENCOUNTER — Encounter (HOSPITAL_COMMUNITY): Payer: Medicare Other | Attending: Hematology & Oncology

## 2015-07-26 ENCOUNTER — Encounter (HOSPITAL_BASED_OUTPATIENT_CLINIC_OR_DEPARTMENT_OTHER): Payer: Medicare Other | Admitting: Hematology & Oncology

## 2015-07-26 ENCOUNTER — Ambulatory Visit (HOSPITAL_COMMUNITY): Payer: Self-pay | Admitting: Hematology & Oncology

## 2015-07-26 ENCOUNTER — Encounter (HOSPITAL_COMMUNITY): Payer: Self-pay | Admitting: Hematology & Oncology

## 2015-07-26 VITALS — BP 130/67 | HR 59 | Temp 98.1°F | Resp 20 | Wt 204.2 lb

## 2015-07-26 DIAGNOSIS — C7802 Secondary malignant neoplasm of left lung: Secondary | ICD-10-CM | POA: Insufficient documentation

## 2015-07-26 DIAGNOSIS — Z95828 Presence of other vascular implants and grafts: Secondary | ICD-10-CM

## 2015-07-26 DIAGNOSIS — C099 Malignant neoplasm of tonsil, unspecified: Secondary | ICD-10-CM

## 2015-07-26 DIAGNOSIS — Z923 Personal history of irradiation: Secondary | ICD-10-CM | POA: Insufficient documentation

## 2015-07-26 DIAGNOSIS — C7801 Secondary malignant neoplasm of right lung: Secondary | ICD-10-CM | POA: Insufficient documentation

## 2015-07-26 DIAGNOSIS — Z9221 Personal history of antineoplastic chemotherapy: Secondary | ICD-10-CM | POA: Insufficient documentation

## 2015-07-26 DIAGNOSIS — T17908D Unspecified foreign body in respiratory tract, part unspecified causing other injury, subsequent encounter: Secondary | ICD-10-CM

## 2015-07-26 LAB — CBC WITH DIFFERENTIAL/PLATELET
BASOS ABS: 0 10*3/uL (ref 0.0–0.1)
BASOS PCT: 1 %
EOS PCT: 4 %
Eosinophils Absolute: 0.2 10*3/uL (ref 0.0–0.7)
HCT: 40.5 % (ref 39.0–52.0)
Hemoglobin: 13.3 g/dL (ref 13.0–17.0)
LYMPHS PCT: 13 %
Lymphs Abs: 0.5 10*3/uL — ABNORMAL LOW (ref 0.7–4.0)
MCH: 29.3 pg (ref 26.0–34.0)
MCHC: 32.8 g/dL (ref 30.0–36.0)
MCV: 89.2 fL (ref 78.0–100.0)
MONO ABS: 0.3 10*3/uL (ref 0.1–1.0)
Monocytes Relative: 8 %
Neutro Abs: 3.2 10*3/uL (ref 1.7–7.7)
Neutrophils Relative %: 74 %
PLATELETS: 142 10*3/uL — AB (ref 150–400)
RBC: 4.54 MIL/uL (ref 4.22–5.81)
RDW: 14.8 % (ref 11.5–15.5)
WBC: 4.3 10*3/uL (ref 4.0–10.5)

## 2015-07-26 LAB — COMPREHENSIVE METABOLIC PANEL
ALT: 11 U/L — ABNORMAL LOW (ref 17–63)
ANION GAP: 6 (ref 5–15)
AST: 19 U/L (ref 15–41)
Albumin: 4 g/dL (ref 3.5–5.0)
Alkaline Phosphatase: 74 U/L (ref 38–126)
BUN: 15 mg/dL (ref 6–20)
CHLORIDE: 104 mmol/L (ref 101–111)
CO2: 29 mmol/L (ref 22–32)
Calcium: 9 mg/dL (ref 8.9–10.3)
Creatinine, Ser: 0.78 mg/dL (ref 0.61–1.24)
GFR calc Af Amer: >60 mL/min (ref >60)
Glucose, Bld: 87 mg/dL (ref 65–99)
POTASSIUM: 3.7 mmol/L (ref 3.5–5.1)
Sodium: 139 mmol/L (ref 135–145)
Total Bilirubin: 0.9 mg/dL (ref 0.3–1.2)
Total Protein: 7.6 g/dL (ref 6.5–8.1)

## 2015-07-26 MED ORDER — HEPARIN SOD (PORK) LOCK FLUSH 100 UNIT/ML IV SOLN
500.0000 [IU] | Freq: Once | INTRAVENOUS | Status: AC
  Administered 2015-07-26: 500 [IU] via INTRAVENOUS

## 2015-07-26 MED ORDER — SODIUM CHLORIDE 0.9% FLUSH
10.0000 mL | INTRAVENOUS | Status: DC | PRN
  Administered 2015-07-26: 10 mL via INTRAVENOUS
  Filled 2015-07-26: qty 10

## 2015-07-26 NOTE — Progress Notes (Signed)
[place] [facility] [place] NOTE  Patient Care Team: Sharilyn Sites, MD as PCP - General (Family Medicine) Leota Sauers, RN as Registered Nurse (Oncology) Heath Lark, MD as Consulting Physician (Hematology and Oncology) Eppie Gibson, MD as Attending Physician (Radiation Oncology) Patrici Ranks, MD as Consulting Physician (Internal Medicine)  Diagnosis: Squamous Cell Carcinoma of the right tonsil, p16+, T3N2cM0, Stage IVA  Right tonsil and bilateral neck / 70 Gy in 35 fractions to gross disease, 63 Gy in 35 fractions to high risk nodal echelons, and 56 Gy in 35 fractions to intermediate risk nodal echelons 05/02/2013-06/22/2013   Oncology History   Tonsil cancer, HPV positive   Primary site: Pharynx - Oropharynx (Right)   Staging method: AJCC 7th Edition   Clinical free text: HPV positive   Clinical: Stage IVA (T3, N2c, M0) signed by Heath Lark, MD on 04/20/2013  9:29 PM   Summary: Stage IVA (T3, N2c, M0)       Tonsil cancer ([place])   02/01/2013 Imaging Ultrasound of the neck revealed bilateral lymphadenopathy in the submandibular region   03/16/2013 Imaging CT scan of the neck show large right tonsil mass measured 3.9 cm in maximum dimension as well as bilateral lymphadenopathy, the largest lymph node measures 31 mm. There is also a left thyroid mass measured 44 mm   03/21/2013 Procedure The patient was seen by ENT with laryngoscopy and biopsy. Pathology is pending   03/25/2013 Imaging PET scan showed large hypermetabolic soft tissue mass in the region of the right palatine tonsil with bilateral cervical hypermetabolic lymphadenopathy indicative of metastatic disease,   04/04/2013 Surgery He underwent placement of Port-A-Cath and feeding tube   04/14/2013 Procedure The patient underwent teeth extraction.   05/02/2013 - 06/13/2013 Chemotherapy The dosage of chemotherapy was interrupted many times due to side effects. His treatment is terminated early due to severe ulcerated  skin toxicity.   05/02/2013 - 06/22/2013 Radiation Therapy Right tonsil and bilateral neck / 70 Gy in 35 fractions to gross disease, 63 Gy in 35 fractions to high risk nodal echelons, and 56 Gy in 35 fractions to intermediate risk nodal echelons 05/02/2013-06/22/2013    10/18/2013 Imaging PET CT scan showed complete response in the oropharynx. However, he has evidence of new bilateral pulmonary metastasis.   12/16/2013 Imaging Repeat PET CT scan show significant progression of pulmonary metastases    01/25/2014 Pathology Results Squamous Cell Carcinoma, CT guided biopsy of RUL nodule   02/08/2014 - 04/20/2014 Chemotherapy Cisplatin/Taxol weekly x 8   04/17/2014 Imaging PET Interval improvement in size and hypermetabolism of the patient's multiple bilateral pulmonary metastases. No evidence for abnormal FDG uptake in the tongue base on the current study.Stable enlargement of the left thyroid lobe.    05/04/2014 - 05/11/2014 Chemotherapy Xeloda 2000 mg in AM and 1500 mg in PM 7 days on and 7 days off   05/11/2014 Adverse Reaction Decreased appetite and increased weakness.   12/14/2014 Imaging possibility of aspiration pneumonitis.Stable subcentimeter non-hypermetabolic pulmonary nodules. No new pulmonary nodules.No metabolic evidence of local tumor recurrence in the pharynx.No metabolic evidence of recurrent nodal metastases    04/04/2015 PET scan mild pathcy/nodular opacity in medial LLL, new. infection remains favored diagnosis. tumor not excluded. increased interstitial markings with superimposed tree-in-bud nodularity/ground glass opacity in RML and BLL, chronic aspiration   04/25/2015 Imaging Modified barium swallow, post XRT appearance of epiglottis with thickened blunted appearance. epiglottic turnover poor, large and silent or weakly responvie aspiration    05/22/2015  Imaging CT chest, marked improvement in bibasilar airspace nodular opacities c/w resolving pulm infection, sub cm nodules unchanged    HISTORY OF  PRESENTING ILLNESS:  [name] 65 y.o. 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 He has been diagnosed with aspiration and has finally made dietary changes.  [name] returns to the [place] today unaccompanied. Overall he is in high spirits, smiling a lot, alert, and happy when told about his good scans.  He notes that he received a big container of thickener, adding that he uses four scoops instead of three to thicken things. He notes that he likes it to be a little thicker than recommended. He confirms that, along with thickening his liquids, they are also still chopping up his food.   He notes that he has a pretty good time down in [place] yesterday, aside from the traffic.  During the physical exam, he denies any headaches. He remarks "I'm staying out of this heat; this heat out there is rough." He confirms that his bowels have been okay, denies any chest pain, and denies coughing up anything. He also confirms that he's been sleeping okay. When his abdomen is palpated, he denies any pain.   MEDICAL HISTORY:  Past Medical History  Diagnosis Date  . Chest pain     10/14  . Urethral stricture     s/p dilitation  . Neuropathy (HCC)     compression neuropathy right hip;s/p replacement  . Allergy   . Fibromyalgia   . Anxiety   . Depression   . Constipation   . Arthritis     knees, HIps, Hands  . Tonsillar cancer ([place])   . Type II diabetes mellitus ([place])   . Pneumonia 2012  . Complication of anesthesia     bleeding during intubation 04/04/13 due to friability of right tonsillar cancer  . PEG (percutaneous endoscopic gastrostomy) status ([place])   . S/P radiation therapy 05/02/2013-06/22/2013    70 [place] - Squamous Cell Carcinoma of the tonsil, p16+, T3N2cM0    SURGICAL HISTORY: Past Surgical History  Procedure Laterality Date  . Total hip arthroplasty Right 2000    Dr. Percell Miller  . Cystoscopy      Dr. Karsten Ro  . Portacath placement  Right 04/04/2013  . Gastrostomy tube placement  04/04/2013  . Portacath placement N/A 04/04/2013    Procedure: INSERTION PORT-A-CATH;  Surgeon: Ralene Ok, MD;  Location: [facility];  Service: General;  Laterality: N/A;  . Laparoscopic gastrostomy N/A 04/04/2013    Procedure: LAPAROSCOPIC GASTROSTOMY TUBE PLACEMENT ;  Surgeon: Ralene Ok, MD;  Location: [facility];  Service: General;  Laterality: N/A;  . Nasal hemorrhage control N/A 04/04/2013    Procedure: Control of oropharyngeal hemorrhage;  Surgeon: Ascencion Dike, MD;  Location: [facility];  Service: ENT;  Laterality: N/A;  . Multiple extractions with alveoloplasty N/A 04/14/2013    Procedure: Extraction of tooth #'s 1,2,3,4,5,6,7,8,9,10,11,12,13,14,15,17,18,19,20,21,22,23,24,25,26,27,28,29, 30, 31, and 32 with alveoloplasty and bilateral mandibular tori reductions.;  Surgeon: Lenn Cal, DDS;  Location: [facility];  Service: Oral Surgery;  Laterality: N/A;    SOCIAL HISTORY: Social History   Social History  . Marital Status: Married    Spouse Name: N/A  . Number of Children: N/A  . Years of Education: N/A   Occupational History  . Not on file.   Social History Main Topics  . Smoking status: Never Smoker   . Smokeless tobacco: Never Used  .  Alcohol Use: No     Comment: Occasional beer  . Drug Use: No  . Sexual Activity: Yes   Other Topics Concern  . Not on file   Social History Narrative  Married for 20 years. They have no children. They have a dog. He has a daughter from a prior relationship, they are estranged. No smoking, worked at [employer]. He has never smoked. Occasional alcohol.  FAMILY HISTORY: Family History  Problem Relation Age of Onset  . Hyperlipidemia Brother   . Cancer Mother     Deceased with leukemia, had uterine ca  . Cancer Cousin     living brain cancer, male   indicated that his mother is deceased. He indicated that his father is deceased. He indicated that his cousin is alive.   His mother had  cervical cancer and leukemia. 1 brother who is healthy.  ALLERGIES:  is allergic to neurontin and dilaudid.  MEDICATIONS:  Current Outpatient Prescriptions  Medication Sig Dispense Refill  . acetaminophen (TYLENOL) 500 MG tablet Take 500-1,000 mg by mouth every 4 (four) hours as needed for mild pain. Reported on 12/27/2014    . alprazolam (XANAX) 2 MG tablet Take 2 mg by mouth 3 (three) times daily as needed for sleep or anxiety.     . Cholecalciferol (VITAMIN D3) 5000 UNITS CAPS Take 5,000 Units by mouth daily. Reported on 05/22/2015    . citalopram (CELEXA) 20 MG tablet Take 1 tablet (20 mg total) by mouth daily. 30 tablet 1  . diphenhydrAMINE (BENADRYL) 25 mg capsule Take 1 capsule (25 mg total) by mouth every 6 (six) hours as needed for itching. 60 capsule 0  . docusate sodium (COLACE) 100 MG capsule Take 100 mg by mouth 2 (two) times daily as needed for mild constipation. Reported on 05/22/2015    . nystatin (MYCOSTATIN) 100000 UNIT/ML suspension Take 5 mLs by mouth as needed. Reported on 12/27/2014    . ondansetron (ZOFRAN) 8 MG tablet Take 1 tablet (8 mg total) by mouth every 8 (eight) hours as needed for nausea or vomiting. 60 tablet 0  . OxyCODONE (OXYCONTIN) 20 mg T12A 12 hr tablet Take 20 mg by mouth every 12 (twelve) hours.     [name] oxyCODONE-acetaminophen (PERCOCET) 10-325 MG per tablet Take 1 tablet by mouth every 4 (four) hours as needed for pain. 30 tablet 0  . polyethylene glycol (MIRALAX / GLYCOLAX) packet Take 17 g by mouth as needed. Reported on 05/22/2015    . pregabalin (LYRICA) 150 MG capsule Take 2 capsules (300 mg total) by mouth 2 (two) times daily. (Patient taking differently: Take 300 mg by mouth 2 (two) times daily. 2 in am and 2 at night) 60 capsule 3  . promethazine (PHENERGAN) 25 MG tablet Take 1 tablet (25 mg total) by mouth every 6 (six) hours as needed for nausea or vomiting. 60 tablet 0  . zolpidem (AMBIEN CR) 6.25 MG CR tablet Take 6.25 mg by mouth at bedtime as needed  for sleep. Reported on 05/22/2015    . lidocaine-prilocaine (EMLA) cream Apply a quarter size amount to port site 1 hour prior to chemo. Do not rub in. Cover with plastic wrap. (Patient not taking: Reported on 07/26/2015) 30 g 3   No current facility-administered medications for this visit.    Review of Systems  Constitutional: Negative for fever, chills, weight loss and  Positive for fatigue.  HENT: Negative for congestion, hearing loss, nosebleeds, sore throat and tinnitus.   Eyes: Negative for  blurred vision, double vision, pain and discharge.  Respiratory:  Negative for hemoptysis, sputum production, shortness of breath and wheezing.   Cardiovascular: Negative for chest pain, palpitations, claudication, leg swelling and PND.  Gastrointestinal: Negative for heartburn, nausea, vomiting, abdominal pain, diarrhea, constipation, blood in stool and melena.  Genitourinary: Negative for dysuria, urgency, frequency and hematuria.  Musculoskeletal: Negative for myalgias, joint pain and falls.  Skin: Negative for itching and rash.  Neurological: Negative for dizziness, tingling, tremors, sensory change, speech change, focal weakness, seizures, loss of consciousness, weakness and headaches.  Endo/Heme/Allergies: Does not bruise/bleed easily.  Psychiatric/Behavioral: Negative for depression, suicidal ideas, memory loss and substance abuse. The patient is not nervous/anxious and does not have insomnia.    14 point review of systems was performed and is negative except as detailed under history of present illness and above    PHYSICAL EXAMINATION:  ECOG PERFORMANCE STATUS: 0 - Asymptomatic  Filed Vitals:   07/26/15 0958  BP: 130/67  Pulse: 59  Temp: 98.1 F (36.7 C)  Resp: 20   Filed Weights   07/26/15 0958  Weight: 204 lb 3.2 oz (92.625 kg)    Physical Exam  Constitutional: He is oriented to person, place, and time and well-developed, well-nourished, and in no distress. He looks  good. HENT:  Head: Normocephalic and atraumatic.  Nose: Nose normal.  Mouth/Throat: Oropharynx is clear and moist. No oropharyngeal exudate. Eyes: Conjunctivae and EOM are normal. Pupils are equal, round, and reactive to light. Right eye exhibits no discharge. Left eye exhibits no discharge. No scleral icterus.  Neck: Normal range of motion. Neck supple. No tracheal deviation present. No thyromegaly present. Mild lymphedema, mild XRT changes noted Cardiovascular: Normal rate, regular rhythm and normal heart sounds.  Exam reveals no gallop and no friction rub.   No murmur heard. Pulmonary/Chest: Effort normal and breath sounds normal. He has no wheezes. He has no rales.   Abdominal: Soft. Bowel sounds are normal. He exhibits no distension and no mass. There is no tenderness. There is no rebound and no guarding.   Musculoskeletal: Normal range of motion. He exhibits no edema.  Lymphadenopathy:    He has no cervical adenopathy.  Neurological: He is alert and oriented to person, place, and time. He has normal reflexes. No cranial nerve deficit. Gait normal. Coordination normal.  Skin: Skin is warm and dry. No rash noted.  Psychiatric: Mood, memory, affect and judgment normal.  Nursing note and vitals reviewed.   LABORATORY DATA:  I have reviewed the data as listed Results for XAIVIER, MALAY (MRN [id number]) as of 08/24/2015 16:53  Ref. Range 07/26/2015 11:55  Sodium Latest Ref Range: 135 - 145 mmol/L 139  Potassium Latest Ref Range: 3.5 - 5.1 mmol/L 3.7  Chloride Latest Ref Range: 101 - 111 mmol/L 104  CO2 Latest Ref Range: 22 - 32 mmol/L 29  BUN Latest Ref Range: 6 - 20 mg/dL 15  Creatinine Latest Ref Range: 0.61 - 1.24 mg/dL 0.78  Calcium Latest Ref Range: 8.9 - 10.3 mg/dL 9.0  EGFR (Non-African Amer.) Latest Ref Range: >60 mL/min >60  EGFR (African American) Latest Ref Range: >60 mL/min >60  Glucose Latest Ref Range: 65 - 99 mg/dL 87  Anion gap Latest Ref Range: 5 - 15  6  Alkaline  Phosphatase Latest Ref Range: 38 - 126 U/L 74  Albumin Latest Ref Range: 3.5 - 5.0 g/dL 4.0  AST Latest Ref Range: 15 - 41 U/L 19  ALT Latest Ref Range: 17 -  63 U/L 11 (L)  Total Protein Latest Ref Range: 6.5 - 8.1 g/dL 7.6  Total Bilirubin Latest Ref Range: 0.3 - 1.2 mg/dL 0.9  WBC Latest Ref Range: 4.0 - 10.5 K/uL 4.3  RBC Latest Ref Range: 4.22 - 5.81 MIL/uL 4.54  Hemoglobin Latest Ref Range: 13.0 - 17.0 g/dL 13.3  HCT Latest Ref Range: 39.0 - 52.0 % 40.5  MCV Latest Ref Range: 78.0 - 100.0 fL 89.2  MCH Latest Ref Range: 26.0 - 34.0 pg 29.3  MCHC Latest Ref Range: 30.0 - 36.0 g/dL 32.8  RDW Latest Ref Range: 11.5 - 15.5 % 14.8  Platelets Latest Ref Range: 150 - 400 K/uL 142 (L)  Neutrophils Latest Units: % 74  Lymphocytes Latest Units: % 13  Monocytes Relative Latest Units: % 8  Eosinophil Latest Units: % 4  Basophil Latest Units: % 1  NEUT# Latest Ref Range: 1.7 - 7.7 K/uL 3.2  Lymphocyte # Latest Ref Range: 0.7 - 4.0 K/uL 0.5 (L)  Monocyte # Latest Ref Range: 0.1 - 1.0 K/uL 0.3  Eosinophils Absolute Latest Ref Range: 0.0 - 0.7 K/uL 0.2  Basophils Absolute Latest Ref Range: 0.0 - 0.1 K/uL 0.0      RADIOLOGY:  Study Result     CLINICAL DATA: Subsequent treatment strategy for tonsil cancer metastatic to right lung.  EXAM: NUCLEAR MEDICINE PET SKULL BASE TO THIGH  TECHNIQUE: 10.37 mCi F-18 FDG was injected intravenously. Full-ring PET imaging was performed from the skull base to thigh after the radiotracer. CT data was obtained and used for attenuation correction and anatomic localization.  FASTING BLOOD GLUCOSE: Value: 91 mg/dl  COMPARISON: PET-CT dated 04/04/2015  FINDINGS: NECK  No hypermetabolic lymph nodes in the neck.  Heterogeneous thyroid with dominant left thyroid goiter. Associated mild hypermetabolism, max SUV 4.3.  CHEST  Scattered small pulmonary nodules in the right lung measuring up to 5 mm, unchanged. Additional scarring with  faint ground-glass opacity in the bilateral lower lobes, likely sequela of prior/chronic aspiration.  No suspicious pulmonary nodules. Biapical pleural-parenchymal scarring. Mild emphysematous changes.  No pleural effusion or pneumothorax.  No hypermetabolic thoracic lymphadenopathy. Mild coronary atherosclerosis. Mild atherosclerotic calcifications of the aortic arch. Right chest port terminates at the cavoatrial junction.  ABDOMEN/PELVIS  No abnormal hypermetabolic activity within the liver, pancreas, adrenal glands, or spleen.  No hypermetabolic lymph nodes in the abdomen or pelvis.  Distended bladder.  SKELETON  No focal hypermetabolic activity to suggest skeletal metastasis.  Right hip arthroplasty.  IMPRESSION: No evidence of recurrent or metastatic disease.  Mild scarring with faint ground-glass opacity in the bilateral lower lobes, likely sequela of prior/chronic aspiration.   Electronically Signed  By: Julian Hy M.D.  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 He completed therapy with Cisplatin and Taxol for his stage IV disease given weekly. He had an excellent response. His performance status is a 0-1.   PET scan is excellent. He is doing well. He is thickening his liquids and finely chopping his foods.   I recommend another PET scan in 4 months.  He will return to see [place] in 4 months. Orders Placed This Encounter  Procedures  . CBC with Differential    Standing Status:   Future    Number  of Occurrences:   1    Standing Expiration Date:   11/25/2016  . Comprehensive metabolic panel    Standing Status:   Future    Number of Occurrences:   1    Standing Expiration Date:    11/25/2016   All questions were answered. The patient knows to call the clinic with any problems, questions or concerns.  This document serves as a record of services personally performed by Ancil Linsey, MD. It was created on her behalf by Toni Amend, a trained medical scribe. The creation of this record is based on the scribe's personal observations and the provider's statements to them. This document has been checked and approved by the attending provider.  I have reviewed the above documentation for accuracy and completeness and I agree with the above.  Kelby Fam. Whitney Muse, MD

## 2015-07-26 NOTE — Progress Notes (Signed)
[name] presented for Portacath access and flush. Portacath located right chest wall accessed with  H 20 needle. Good blood return present. Portacath flushed with 60m NS and 500U/563mHeparin and needle removed intact. Procedure without incident. Patient tolerated procedure well.  Labs drawn per orders

## 2015-07-26 NOTE — Patient Instructions (Signed)
[place] at [facility] Discharge Instructions  RECOMMENDATIONS MADE BY THE CONSULTANT AND ANY TEST RESULTS WILL BE SENT TO YOUR REFERRING PHYSICIAN.  Port flushed with labs today. Follow up as scheduled Call for any concerns or questions  Thank you for choosing [place] at [facility] to provide your oncology and hematology care.  To afford each patient quality time with our provider, please arrive at least 15 minutes before your scheduled appointment time.   Beginning January 23rd 2017 lab work for the [facility] will be done in the  Main lab at [facility] on 1st floor. If you have a lab appointment with the [place] please come in thru the  Main Entrance and check in at the main information desk  You need to re-schedule your appointment should you arrive 10 or more minutes late.  We strive to give you quality time with our providers, and arriving late affects you and other patients whose appointments are after yours.  Also, if you no show three or more times for appointments you may be dismissed from the clinic at the providers discretion.     Again, thank you for choosing [facility].  Our hope is that these requests will decrease the amount of time that you wait before being seen by our physicians.       _____________________________________________________________  Should you have questions after your visit to [facility], please contact our office at [phone] between the hours of 8:30 a.m. and 4:30 p.m.  Voicemails left after 4:30 p.m. will not be returned until the following business day.  For prescription refill requests, have your pharmacy contact our office.         Resources For Cancer Patients and their Caregivers ? American Cancer Society: Can assist with transportation, wigs, general needs, runs Look Good Feel Better.        3055498697 ? Cancer Care: Provides financial  assistance, online support groups, medication/co-pay assistance.  1-800-813-HOPE 978-453-3250) ? [place] Assists [place] cancer patients and their families through emotional , educational and financial support.  352-415-5084 ? [place] DSS Where to apply for food stamps, Medicaid and utility assistance. (579)801-9719 ? RCATS: Transportation to medical appointments. 414-802-2204 ? Social Security Administration: May apply for disability if have a Stage IV cancer. 709-234-2342 9717063431 ? [employer], Disability and Transit Services: Assists with nutrition, care and transit needs. [place] Support Programs: '@10RELATIVEDAYS'$ @ > Cancer Support Group  2nd Tuesday of the month 1pm-2pm, Journey Room  > Creative Journey  3rd Tuesday of the month 1130am-1pm, Journey Room  > Look Good Feel Better  1st Wednesday of the month 10am-12 noon, Journey Room (Call [place] to register [phone])

## 2015-07-26 NOTE — Patient Instructions (Signed)
[place] at [facility] Discharge Instructions  RECOMMENDATIONS MADE BY THE CONSULTANT AND ANY TEST RESULTS WILL BE SENT TO YOUR REFERRING PHYSICIAN.  You were seen by Dr. Whitney Muse today. Return to the Clinic in 4 months with labs. Please call the clinic if you have any concerns.  Thank you for choosing [place] at [facility] to provide your oncology and hematology care.  To afford each patient quality time with our provider, please arrive at least 15 minutes before your scheduled appointment time.   Beginning January 23rd 2017 lab work for the [facility] will be done in the  Main lab at [facility] on 1st floor. If you have a lab appointment with the [facility] please come in thru the  Main Entrance and check in at the main information desk  You need to re-schedule your appointment should you arrive 10 or more minutes late.  We strive to give you quality time with our providers, and arriving late affects you and other patients whose appointments are after yours.  Also, if you no show three or more times for appointments you may be dismissed from the clinic at the providers discretion.     Again, thank you for choosing [facility].  Our hope is that these requests will decrease the amount of time that you wait before being seen by our physicians.       _____________________________________________________________  Should you have questions after your visit to [facility], please contact our office at [phone] between the hours of 8:30 a.m. and 4:30 p.m.  Voicemails left after 4:30 p.m. will not be returned until the following business day.  For prescription refill requests, have your pharmacy contact our office.         Resources For Cancer Patients and their Caregivers ? American Cancer Society: Can assist with transportation, wigs, general needs, runs Look Good Feel Better.         ([phone] ? Cancer Care: Provides financial assistance, online support groups, medication/co-pay assistance.  1-800-813-HOPE (570) 400-4104) ? [place] Assists [place] cancer patients and their families through emotional , educational and financial support.  [phone] ? [place] DSS Where to apply for food stamps, Medicaid and utility assistance. 214-247-0212 ? RCATS: Transportation to medical appointments. (734)348-6927 ? Social Security Administration: May apply for disability if have a Stage IV cancer. 3526291382 314 636 1297 ? [employer], Disability and Transit Services: Assists with nutrition, care and transit needs. [place] Support Programs: '@10RELATIVEDAYS'$ @ > Cancer Support Group  2nd Tuesday of the month 1pm-2pm, Journey Room  > Creative Journey  3rd Tuesday of the month 1130am-1pm, Journey Room  > Look Good Feel Better  1st Wednesday of the month 10am-12 noon, Journey Room (Call [place] to register [phone])

## 2015-08-03 DIAGNOSIS — F419 Anxiety disorder, unspecified: Secondary | ICD-10-CM | POA: Diagnosis not present

## 2015-08-03 DIAGNOSIS — E119 Type 2 diabetes mellitus without complications: Secondary | ICD-10-CM | POA: Diagnosis not present

## 2015-08-03 DIAGNOSIS — Z6824 Body mass index (BMI) 24.0-24.9, adult: Secondary | ICD-10-CM | POA: Diagnosis not present

## 2015-08-03 DIAGNOSIS — Z1389 Encounter for screening for other disorder: Secondary | ICD-10-CM | POA: Diagnosis not present

## 2015-08-03 DIAGNOSIS — G894 Chronic pain syndrome: Secondary | ICD-10-CM | POA: Diagnosis not present

## 2015-08-24 ENCOUNTER — Encounter (HOSPITAL_COMMUNITY): Payer: Self-pay | Admitting: Hematology & Oncology

## 2015-09-20 ENCOUNTER — Encounter (HOSPITAL_COMMUNITY): Payer: Self-pay

## 2015-10-16 ENCOUNTER — Encounter (HOSPITAL_COMMUNITY): Payer: Medicare Other | Attending: Hematology & Oncology

## 2015-10-16 VITALS — BP 139/81 | HR 73 | Temp 97.9°F | Resp 20

## 2015-10-16 DIAGNOSIS — C099 Malignant neoplasm of tonsil, unspecified: Secondary | ICD-10-CM | POA: Diagnosis not present

## 2015-10-16 DIAGNOSIS — C7801 Secondary malignant neoplasm of right lung: Secondary | ICD-10-CM | POA: Diagnosis not present

## 2015-10-16 DIAGNOSIS — C7802 Secondary malignant neoplasm of left lung: Secondary | ICD-10-CM | POA: Insufficient documentation

## 2015-10-16 DIAGNOSIS — Z9221 Personal history of antineoplastic chemotherapy: Secondary | ICD-10-CM | POA: Insufficient documentation

## 2015-10-16 DIAGNOSIS — Z95828 Presence of other vascular implants and grafts: Secondary | ICD-10-CM

## 2015-10-16 DIAGNOSIS — Z23 Encounter for immunization: Secondary | ICD-10-CM

## 2015-10-16 DIAGNOSIS — Z923 Personal history of irradiation: Secondary | ICD-10-CM | POA: Insufficient documentation

## 2015-10-16 MED ORDER — HEPARIN SOD (PORK) LOCK FLUSH 100 UNIT/ML IV SOLN
500.0000 [IU] | Freq: Once | INTRAVENOUS | Status: AC
Start: 2015-10-16 — End: 2015-10-16
  Administered 2015-10-16: 500 [IU] via INTRAVENOUS

## 2015-10-16 MED ORDER — HEPARIN SOD (PORK) LOCK FLUSH 100 UNIT/ML IV SOLN
INTRAVENOUS | Status: AC
  Filled 2015-10-16: qty 5

## 2015-10-16 MED ORDER — INFLUENZA VAC SPLIT QUAD 0.5 ML IM SUSY
0.5000 mL | PREFILLED_SYRINGE | Freq: Once | INTRAMUSCULAR | Status: AC
  Administered 2015-10-16: 0.5 mL via INTRAMUSCULAR
  Filled 2015-10-16: qty 0.5

## 2015-10-16 MED ORDER — SODIUM CHLORIDE 0.9% FLUSH
10.0000 mL | INTRAVENOUS | Status: DC | PRN
  Administered 2015-10-16: 10 mL via INTRAVENOUS
  Filled 2015-10-16: qty 10

## 2015-10-16 NOTE — Patient Instructions (Signed)
[place] at [facility] Discharge Instructions  RECOMMENDATIONS MADE BY THE CONSULTANT AND ANY TEST RESULTS WILL BE SENT TO YOUR REFERRING PHYSICIAN.  Port flushed per protocol today and Influenza vaccine given. Follow-up as scheduled. Call clinic for any questions or concerns  Thank you for choosing [place] at [facility] to provide your oncology and hematology care.  To afford each patient quality time with our provider, please arrive at least 15 minutes before your scheduled appointment time.   Beginning January 23rd 2017 lab work for the [facility] will be done in the  Main lab at [facility] on 1st floor. If you have a lab appointment with the [facility] please come in thru the  Main Entrance and check in at the main information desk  You need to re-schedule your appointment should you arrive 10 or more minutes late.  We strive to give you quality time with our providers, and arriving late affects you and other patients whose appointments are after yours.  Also, if you no show three or more times for appointments you may be dismissed from the clinic at the providers discretion.     Again, thank you for choosing [facility].  Our hope is that these requests will decrease the amount of time that you wait before being seen by our physicians.       _____________________________________________________________  Should you have questions after your visit to [facility], please contact our office at [phone] between the hours of 8:30 a.m. and 4:30 p.m.  Voicemails left after 4:30 p.m. will not be returned until the following business day.  For prescription refill requests, have your pharmacy contact our office.         Resources For Cancer Patients and their Caregivers ? American Cancer Society: Can assist with transportation, wigs, general needs, runs Look Good Feel Better.         316-213-0968 ? Cancer Care: Provides financial assistance, online support groups, medication/co-pay assistance.  1-800-813-HOPE (209) 181-7195) ? [place] Assists [place] cancer patients and their families through emotional , educational and financial support.  210-134-7313 ? [place] DSS Where to apply for food stamps, Medicaid and utility assistance. 641 717 2559 ? RCATS: Transportation to medical appointments. 2100530346 ? Social Security Administration: May apply for disability if have a Stage IV cancer. (364) 037-0776 (201) 139-0792 ? [employer], Disability and Transit Services: Assists with nutrition, care and transit needs. [place] Support Programs: '@10RELATIVEDAYS'$ @ > Cancer Support Group  2nd Tuesday of the month 1pm-2pm, Journey Room  > Creative Journey  3rd Tuesday of the month 1130am-1pm, Journey Room  > Look Good Feel Better  1st Wednesday of the month 10am-12 noon, Journey Room (Call [place] to register [phone])

## 2015-10-16 NOTE — Progress Notes (Signed)
[name] tolerated Port flush and Influenza vaccine well without complaints or incident. Port flushed with 10 ml NS and 5 ml Heparin easily per protocol. Pt discharged self ambulatory in satisfactory condition

## 2015-11-26 ENCOUNTER — Encounter (HOSPITAL_BASED_OUTPATIENT_CLINIC_OR_DEPARTMENT_OTHER): Payer: Medicare Other

## 2015-11-26 ENCOUNTER — Ambulatory Visit (HOSPITAL_COMMUNITY)
Admission: RE | Admit: 2015-11-26 | Discharge: 2015-11-26 | Disposition: A | Discharge status: Home / Self Care | Payer: Medicare Other | Source: Ambulatory Visit | Attending: Hematology & Oncology | Admitting: Hematology & Oncology

## 2015-11-26 ENCOUNTER — Encounter (HOSPITAL_COMMUNITY): Payer: Self-pay | Admitting: Hematology & Oncology

## 2015-11-26 ENCOUNTER — Encounter (HOSPITAL_COMMUNITY): Payer: Medicare Other | Attending: Hematology & Oncology | Admitting: Hematology & Oncology

## 2015-11-26 VITALS — BP 120/67 | HR 62 | Temp 98.1°F | Resp 16 | Wt 231.0 lb

## 2015-11-26 DIAGNOSIS — C7802 Secondary malignant neoplasm of left lung: Secondary | ICD-10-CM | POA: Diagnosis not present

## 2015-11-26 DIAGNOSIS — Z9221 Personal history of antineoplastic chemotherapy: Secondary | ICD-10-CM | POA: Insufficient documentation

## 2015-11-26 DIAGNOSIS — C099 Malignant neoplasm of tonsil, unspecified: Secondary | ICD-10-CM | POA: Insufficient documentation

## 2015-11-26 DIAGNOSIS — T17908D Unspecified foreign body in respiratory tract, part unspecified causing other injury, subsequent encounter: Secondary | ICD-10-CM

## 2015-11-26 DIAGNOSIS — Z923 Personal history of irradiation: Secondary | ICD-10-CM | POA: Diagnosis not present

## 2015-11-26 DIAGNOSIS — C7801 Secondary malignant neoplasm of right lung: Secondary | ICD-10-CM | POA: Insufficient documentation

## 2015-11-26 DIAGNOSIS — R05 Cough: Secondary | ICD-10-CM | POA: Diagnosis not present

## 2015-11-26 LAB — CBC WITH DIFFERENTIAL/PLATELET
BASOS ABS: 0 10*3/uL (ref 0.0–0.1)
Basophils Relative: 1 %
EOS PCT: 6 %
Eosinophils Absolute: 0.3 10*3/uL (ref 0.0–0.7)
HEMATOCRIT: 42.6 % (ref 39.0–52.0)
Hemoglobin: 13.7 g/dL (ref 13.0–17.0)
LYMPHS PCT: 14 %
Lymphs Abs: 0.6 10*3/uL — ABNORMAL LOW (ref 0.7–4.0)
MCH: 29 pg (ref 26.0–34.0)
MCHC: 32.2 g/dL (ref 30.0–36.0)
MCV: 90.3 fL (ref 78.0–100.0)
Monocytes Absolute: 0.6 10*3/uL (ref 0.1–1.0)
Monocytes Relative: 14 %
NEUTROS ABS: 2.7 10*3/uL (ref 1.7–7.7)
Neutrophils Relative %: 65 %
PLATELETS: 134 10*3/uL — AB (ref 150–400)
RBC: 4.72 MIL/uL (ref 4.22–5.81)
RDW: 15.5 % (ref 11.5–15.5)
WBC: 4.2 10*3/uL (ref 4.0–10.5)

## 2015-11-26 LAB — COMPREHENSIVE METABOLIC PANEL
ALBUMIN: 3.8 g/dL (ref 3.5–5.0)
ALT: 27 U/L (ref 17–63)
AST: 37 U/L (ref 15–41)
Alkaline Phosphatase: 63 U/L (ref 38–126)
Anion gap: 7 (ref 5–15)
BUN: 14 mg/dL (ref 6–20)
CHLORIDE: 104 mmol/L (ref 101–111)
CO2: 27 mmol/L (ref 22–32)
CREATININE: 0.82 mg/dL (ref 0.61–1.24)
Calcium: 8.8 mg/dL — ABNORMAL LOW (ref 8.9–10.3)
GFR calc Af Amer: >60 mL/min (ref >60)
GLUCOSE: 82 mg/dL (ref 65–99)
Potassium: 3.9 mmol/L (ref 3.5–5.1)
Sodium: 138 mmol/L (ref 135–145)
Total Bilirubin: 0.7 mg/dL (ref 0.3–1.2)
Total Protein: 7.6 g/dL (ref 6.5–8.1)

## 2015-11-26 MED ORDER — HEPARIN SOD (PORK) LOCK FLUSH 100 UNIT/ML IV SOLN
500.0000 [IU] | Freq: Once | INTRAVENOUS | Status: AC
  Administered 2015-11-26: 500 [IU] via INTRAVENOUS
  Filled 2015-11-26: qty 5

## 2015-11-26 MED ORDER — SODIUM CHLORIDE 0.9% FLUSH
10.0000 mL | INTRAVENOUS | Status: DC | PRN
  Administered 2015-11-26: 10 mL via INTRAVENOUS
  Filled 2015-11-26: qty 10

## 2015-11-26 NOTE — Progress Notes (Signed)
[facility] [place] NOTE  Patient Care Team: Sharilyn Sites, MD as PCP - General (Family Medicine) Leota Sauers, RN as Registered Nurse (Oncology) Heath Lark, MD as Consulting Physician (Hematology and Oncology) Eppie Gibson, MD as Attending Physician (Radiation Oncology) Patrici Ranks, MD as Consulting Physician (Internal Medicine)  Diagnosis: Squamous Cell Carcinoma of the right tonsil, p16+, T3N2cM0, Stage IVA  Right tonsil and bilateral neck / 70 Gy in 35 fractions to gross disease, 63 Gy in 35 fractions to high risk nodal echelons, and 56 Gy in 35 fractions to intermediate risk nodal echelons 05/02/2013-06/22/2013   Oncology History   Tonsil cancer, HPV positive   Primary site: Pharynx - Oropharynx (Right)   Staging method: AJCC 7th Edition   Clinical free text: HPV positive   Clinical: Stage IVA (T3, N2c, M0) signed by Heath Lark, MD on 04/20/2013  9:29 PM   Summary: Stage IVA (T3, N2c, M0)       Tonsil cancer ([place])   02/01/2013 Imaging    Ultrasound of the neck revealed bilateral lymphadenopathy in the submandibular region      03/16/2013 Imaging    CT scan of the neck show large right tonsil mass measured 3.9 cm in maximum dimension as well as bilateral lymphadenopathy, the largest lymph node measures 31 mm. There is also a left thyroid mass measured 44 mm      03/21/2013 Procedure    The patient was seen by ENT with laryngoscopy and biopsy. Pathology is pending      03/25/2013 Imaging    PET scan showed large hypermetabolic soft tissue mass in the region of the right palatine tonsil with bilateral cervical hypermetabolic lymphadenopathy indicative of metastatic disease,      04/04/2013 Surgery    [name] underwent placement of Port-A-Cath and feeding tube      04/14/2013 Procedure    The patient underwent teeth extraction.      05/02/2013 - 06/13/2013 Chemotherapy    The dosage of chemotherapy was interrupted many times due to side effects.  His treatment is terminated early due to severe ulcerated skin toxicity.      05/02/2013 - 06/22/2013 Radiation Therapy    Right tonsil and bilateral neck / 70 Gy in 35 fractions to gross disease, 63 Gy in 35 fractions to high risk nodal echelons, and 56 Gy in 35 fractions to intermediate risk nodal echelons 05/02/2013-06/22/2013       10/18/2013 Imaging    PET CT scan showed complete response in the oropharynx. However, [name] has evidence of new bilateral pulmonary metastasis.      12/16/2013 Imaging    Repeat PET CT scan show significant progression of pulmonary metastases       01/25/2014 Pathology Results    Squamous Cell Carcinoma, CT guided biopsy of RUL nodule      02/08/2014 - 04/20/2014 Chemotherapy    Cisplatin/Taxol weekly x 8      04/17/2014 Imaging    PET Interval improvement in size and hypermetabolism of the patient's multiple bilateral pulmonary metastases. No evidence for abnormal FDG uptake in the tongue base on the current study.Stable enlargement of the left thyroid lobe.       05/04/2014 - 05/11/2014 Chemotherapy    Xeloda 2000 mg in AM and 1500 mg in PM 7 days on and 7 days off      05/11/2014 Adverse Reaction    Decreased appetite and increased weakness.      12/14/2014 Imaging  possibility of aspiration pneumonitis.Stable subcentimeter non-hypermetabolic pulmonary nodules. No new pulmonary nodules.No metabolic evidence of local tumor recurrence in the pharynx.No metabolic evidence of recurrent nodal metastases       04/04/2015 PET scan    mild pathcy/nodular opacity in medial LLL, new. infection remains favored diagnosis. tumor not excluded. increased interstitial markings with superimposed tree-in-bud nodularity/ground glass opacity in RML and BLL, chronic aspiration      04/25/2015 Imaging    Modified barium swallow, post XRT appearance of epiglottis with thickened blunted appearance. epiglottic turnover poor, large and silent or weakly responvie aspiration        05/22/2015 Imaging    CT chest, marked improvement in bibasilar airspace nodular opacities c/w resolving pulm infection, sub cm nodules unchanged      07/25/2015 PET scan    No evidence of recurrent or metastatic disease, mild scarring with faint ground glass opacity in the bilateral lower lobes likely sequela of prior/chronic aspiration       HISTORY OF PRESENTING ILLNESS:  [name] 65 y.o. 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 [name] has been diagnosed with aspiration and has finally made dietary changes. [name] notes however that [name] has been neglecting thickening his fluids regularly.   [name] returns to the [facility] today unaccompanied.   [name] has been eating "too much". [name] has gained about 20 lbs since July 2017. His wife has not been feeling well lately, so [name] has been taking care of her. [name] is able to do whatever [name] wants but notes [name] has slowed down on his outside activities because of his wife's recent health problems. [name] denies SOB, no cough. [name] denies choking on his food, but again notes that [name] has not been thickening his fluids.  [name] has received a flu shot this year.  [name] feels well. No other complaints. Will be due for repeat PET in January.    MEDICAL HISTORY:  Past Medical History:  Diagnosis Date  . Allergy   . Anxiety   . Arthritis    knees, HIps, Hands  . Chest pain    10/14  . Complication of anesthesia    bleeding during intubation 04/04/13 due to friability of right tonsillar cancer  . Constipation   . Depression   . Fibromyalgia   . Neuropathy (HCC)    compression neuropathy right hip;s/p replacement  . PEG (percutaneous endoscopic gastrostomy) status ([facility])   . Pneumonia 2012  . 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 Tonsillar cancer ([place])   . Type II diabetes mellitus ([place])   . Urethral stricture    s/p dilitation    SURGICAL  HISTORY: Past Surgical History:  Procedure Laterality Date  . CYSTOSCOPY     Dr. Karsten Ro  . GASTROSTOMY TUBE PLACEMENT  04/04/2013  . LAPAROSCOPIC GASTROSTOMY N/A 04/04/2013   Procedure: LAPAROSCOPIC GASTROSTOMY TUBE PLACEMENT ;  Surgeon: Ralene Ok, MD;  Location: [facility];  Service: General;  Laterality: N/A;  . MULTIPLE EXTRACTIONS WITH ALVEOLOPLASTY N/A 04/14/2013   Procedure: Extraction of tooth #'s 1,2,3,4,5,6,7,8,9,10,11,12,13,14,15,17,18,19,20,21,22,23,24,25,26,27,28,29, 30, 31, and 32 with alveoloplasty and bilateral mandibular tori reductions.;  Surgeon: Lenn Cal, DDS;  Location: [facility];  Service: Oral Surgery;  Laterality: N/A;  . NASAL HEMORRHAGE CONTROL N/A 04/04/2013   Procedure: Control of oropharyngeal hemorrhage;  Surgeon: Ascencion Dike, MD;  Location: [facility];  Service: ENT;  Laterality:  N/A;  . PORTACATH PLACEMENT Right 04/04/2013  . PORTACATH PLACEMENT N/A 04/04/2013   Procedure: INSERTION PORT-A-CATH;  Surgeon: Ralene Ok, MD;  Location: [facility];  Service: General;  Laterality: N/A;  . TOTAL HIP ARTHROPLASTY Right 2000   Dr. Percell Miller    SOCIAL HISTORY: Social History   Social History  . Marital status: Married    Spouse name: N/A  . Number of children: N/A  . Years of education: N/A   Occupational History  . Not on file.   Social History Main Topics  . Smoking status: Never Smoker  . Smokeless tobacco: Never Used  . Alcohol use No     Comment: Occasional beer  . Drug use: No  . Sexual activity: Yes     Comment: married   Other Topics Concern  . Not on file   Social History Narrative  . No narrative on file  Married for 20 years. They have no children. They have a dog. [name] has a daughter from a prior relationship, they are estranged. No smoking, worked at [employer]. [name] has never smoked. Occasional alcohol.  FAMILY HISTORY: Family History  Problem Relation Age of Onset  . Cancer Cousin     living brain cancer, male  . Cancer Mother      Deceased with leukemia, had uterine ca  . Hyperlipidemia Brother    indicated that his mother is deceased. [name] indicated that his father is deceased. [name] indicated that the status of his brother is unknown. [name] indicated that his cousin is alive.    His mother had cervical cancer and leukemia. 1 brother who is healthy.  ALLERGIES:  is allergic to neurontin [gabapentin] and dilaudid [hydromorphone hcl].  MEDICATIONS:  Current Outpatient Prescriptions  Medication Sig Dispense Refill  . acetaminophen (TYLENOL) 500 MG tablet Take 500-1,000 mg by mouth every 4 (four) hours as needed for mild pain. Reported on 12/27/2014    . alprazolam (XANAX) 2 MG tablet Take 2 mg by mouth 3 (three) times daily as needed for sleep or anxiety.     . Cholecalciferol (VITAMIN D3) 5000 UNITS CAPS Take 5,000 Units by mouth daily. Reported on 05/22/2015    . citalopram (CELEXA) 20 MG tablet Take 1 tablet (20 mg total) by mouth daily. 30 tablet 1  . diphenhydrAMINE (BENADRYL) 25 mg capsule Take 1 capsule (25 mg total) by mouth every 6 (six) hours as needed for itching. 60 capsule 0  . ondansetron (ZOFRAN) 8 MG tablet Take 1 tablet (8 mg total) by mouth every 8 (eight) hours as needed for nausea or vomiting. 60 tablet 0  . OxyCODONE (OXYCONTIN) 20 mg T12A 12 hr tablet Take 20 mg by mouth every 12 (twelve) hours.     [name] oxyCODONE-acetaminophen (PERCOCET) 10-325 MG per tablet Take 1 tablet by mouth every 4 (four) hours as needed for pain. 30 tablet 0  . pregabalin (LYRICA) 150 MG capsule Take 2 capsules (300 mg total) by mouth 2 (two) times daily. (Patient taking differently: Take 300 mg by mouth 2 (two) times daily. 2 in am and 2 at night) 60 capsule 3  . zolpidem (AMBIEN CR) 6.25 MG CR tablet Take 6.25 mg by mouth at bedtime as needed for sleep. Reported on 05/22/2015     No current facility-administered medications for this visit.     Review of Systems  Constitutional: Negative.  Negative for weight loss.  HENT: Negative.    Eyes: Negative.   Respiratory: Negative.   Cardiovascular: Negative.  Gastrointestinal: Negative.   Genitourinary: Negative.   Musculoskeletal: Negative.   Skin: Negative.   Neurological: Negative.   Endo/Heme/Allergies: Negative.   Psychiatric/Behavioral: Negative.   All other systems reviewed and are negative. 14 point review of systems was performed and is negative except as detailed under history of present illness and above   PHYSICAL EXAMINATION:  ECOG PERFORMANCE STATUS: 0 - Asymptomatic  Vitals:   11/26/15 1333  BP: 120/67  Pulse: 62  Resp: 16  Temp: 98.1 F (36.7 C)   Filed Weights   11/26/15 1333  Weight: 231 lb (104.8 kg)    Physical Exam  Constitutional: [name] is oriented to person, place, and time and well-developed, well-nourished, and in no distress.  HENT:  Head: Normocephalic and atraumatic.  Mouth/Throat: Oropharynx is clear and moist.  Eyes: Conjunctivae and EOM are normal. Pupils are equal, round, and reactive to light. No scleral icterus.  Neck: Normal range of motion. Neck supple.  Cardiovascular: Normal rate, regular rhythm and normal heart sounds.   Pulmonary/Chest: Effort normal. No respiratory distress.  Rhonchi scattered throughout  Abdominal: Soft. Bowel sounds are normal. [name] exhibits no distension and no mass. There is no tenderness. There is no rebound and no guarding.  Musculoskeletal: Normal range of motion.  Lymphadenopathy:    [name] has no cervical adenopathy.  Neurological: [name] is alert and oriented to person, place, and time. Gait normal.  Skin: Skin is warm and dry.  Psychiatric: Mood, memory, affect and judgment normal.  Nursing note and vitals reviewed.  LABORATORY DATA:  I have reviewed the data as listed Results for BRYAM, TABORDA (MRN [id number]) as of 11/27/2015 07:54  Ref. Range 11/26/2015 14:48  Sodium Latest Ref Range: 135 - 145 mmol/L 138  Potassium Latest Ref Range: 3.5 - 5.1 mmol/L 3.9  Chloride Latest Ref Range: 101 -  111 mmol/L 104  CO2 Latest Ref Range: 22 - 32 mmol/L 27  BUN Latest Ref Range: 6 - 20 mg/dL 14  Creatinine Latest Ref Range: 0.61 - 1.24 mg/dL 0.82  Calcium Latest Ref Range: 8.9 - 10.3 mg/dL 8.8 (L)  EGFR (Non-African Amer.) Latest Ref Range: >60 mL/min >60  EGFR (African American) Latest Ref Range: >60 mL/min >60  Glucose Latest Ref Range: 65 - 99 mg/dL 82  Anion gap Latest Ref Range: 5 - 15  7  Alkaline Phosphatase Latest Ref Range: 38 - 126 U/L 63  Albumin Latest Ref Range: 3.5 - 5.0 g/dL 3.8  AST Latest Ref Range: 15 - 41 U/L 37  ALT Latest Ref Range: 17 - 63 U/L 27  Total Protein Latest Ref Range: 6.5 - 8.1 g/dL 7.6  Total Bilirubin Latest Ref Range: 0.3 - 1.2 mg/dL 0.7  WBC Latest Ref Range: 4.0 - 10.5 K/uL 4.2  RBC Latest Ref Range: 4.22 - 5.81 MIL/uL 4.72  Hemoglobin Latest Ref Range: 13.0 - 17.0 g/dL 13.7  HCT Latest Ref Range: 39.0 - 52.0 % 42.6  MCV Latest Ref Range: 78.0 - 100.0 fL 90.3  MCH Latest Ref Range: 26.0 - 34.0 pg 29.0  MCHC Latest Ref Range: 30.0 - 36.0 g/dL 32.2  RDW Latest Ref Range: 11.5 - 15.5 % 15.5  Platelets Latest Ref Range: 150 - 400 K/uL 134 (L)  Neutrophils Latest Units: % 65  Lymphocytes Latest Units: % 14  Monocytes Relative Latest Units: % 14  Eosinophil Latest Units: % 6  Basophil Latest Units: % 1  NEUT# Latest Ref Range: 1.7 - 7.7 K/uL 2.7  Lymphocyte # Latest  Ref Range: 0.7 - 4.0 K/uL 0.6 (L)  Monocyte # Latest Ref Range: 0.1 - 1.0 K/uL 0.6  Eosinophils Absolute Latest Ref Range: 0.0 - 0.7 K/uL 0.3  Basophils Absolute Latest Ref Range: 0.0 - 0.1 K/uL 0.0      RADIOLOGY: I have personally reviewed the radiological images as listed and agreed with the findings in the report.  Study Result     CLINICAL DATA: Subsequent treatment strategy for tonsil cancer metastatic to right lung.  EXAM: NUCLEAR MEDICINE PET SKULL BASE TO THIGH  TECHNIQUE: 10.37 mCi F-18 FDG was injected intravenously. Full-ring PET imaging was performed  from the skull base to thigh after the radiotracer. CT data was obtained and used for attenuation correction and anatomic localization.  FASTING BLOOD GLUCOSE: Value: 91 mg/dl  COMPARISON: PET-CT dated 04/04/2015  FINDINGS: NECK  No hypermetabolic lymph nodes in the neck.  Heterogeneous thyroid with dominant left thyroid goiter. Associated mild hypermetabolism, max SUV 4.3.  CHEST  Scattered small pulmonary nodules in the right lung measuring up to 5 mm, unchanged. Additional scarring with faint ground-glass opacity in the bilateral lower lobes, likely sequela of prior/chronic aspiration.  No suspicious pulmonary nodules. Biapical pleural-parenchymal scarring. Mild emphysematous changes.  No pleural effusion or pneumothorax.  No hypermetabolic thoracic lymphadenopathy. Mild coronary atherosclerosis. Mild atherosclerotic calcifications of the aortic arch. Right chest port terminates at the cavoatrial junction.  ABDOMEN/PELVIS  No abnormal hypermetabolic activity within the liver, pancreas, adrenal glands, or spleen.  No hypermetabolic lymph nodes in the abdomen or pelvis.  Distended bladder.  SKELETON  No focal hypermetabolic activity to suggest skeletal metastasis.  Right hip arthroplasty.  IMPRESSION: No evidence of recurrent or metastatic disease.  Mild scarring with faint ground-glass opacity in the bilateral lower lobes, likely sequela of prior/chronic aspiration.   Electronically Signed  By: Julian Hy M.D.  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 [name] is a lifelong nonsmoker, unfortunately has stage IV disease. [name] completed therapy with Cisplatin and Taxol for his stage IV disease given weekly. [name] had an excellent response. His  performance status is a 0-1.   Port flushed today, Labs obtained and results reviewed and noted above.   I have ordered a Chest X-ray to be performed after our visit today given his PE findings. I have encouraged Kyri to thicken his liquids and continue with prior recommendations per speech therapy. [name] was treated for aspiration pneumonia on multiple occasions in the past and then did very well for some time.   [name] is due for another PET scan in January 2018. I have placed an order for this.  [name] will return for follow up after his PET in January to discuss these results.  Flu shot has been given.   Orders Placed This Encounter  Procedures  . NM PET Image Restag (PS) Skull Base To Thigh    Standing Status:   Future    Standing Expiration Date:   11/25/2016    Order Specific Question:   Reason for Exam (SYMPTOM  OR DIAGNOSIS REQUIRED)    Answer:   restaging stage IV head and neck cancer    Order Specific Question:   Preferred imaging location?    Answer:   [facility]    Order Specific Question:   If indicated for the ordered procedure,  I authorize the administration of a radiopharmaceutical per Radiology protocol    Answer:   Yes  . DG Chest 2 View    Standing Status:   Future    Number of Occurrences:   1    Standing Expiration Date:   11/25/2016    Order Specific Question:   Reason for Exam (SYMPTOM  OR DIAGNOSIS REQUIRED)    Answer:   rhonchi L lung, history aspiration    Order Specific Question:   Preferred imaging location?    Answer:   [facility]  . CBC with Differential    Standing Status:   Future    Number of Occurrences:   1    Standing Expiration Date:   11/25/2016  . Comprehensive metabolic panel    Standing Status:   Future    Number of Occurrences:   1    Standing Expiration Date:   11/25/2016   All questions were answered. The patient knows to call the clinic with any problems, questions or concerns.  This document serves as a record of services  personally performed by Ancil Linsey, MD. It was created on her behalf by Arlyce Harman, a trained medical scribe. The creation of this record is based on the scribe's personal observations and the provider's statements to them. This document has been checked and approved by the attending provider.  I have reviewed the above documentation for accuracy and completeness and I agree with the above.  Kelby Fam. Whitney Muse, MD

## 2015-11-26 NOTE — Patient Instructions (Addendum)
[place] at [facility] Discharge Instructions  RECOMMENDATIONS MADE BY THE CONSULTANT AND ANY TEST RESULTS WILL BE SENT TO YOUR REFERRING PHYSICIAN.  You saw Dr.Penland today. PET scan in January. Follow up after PET. Continue port flushes. CXR  See [name] at checkout for appointments.  Thank you for choosing [place] at [facility] to provide your oncology and hematology care.  To afford each patient quality time with our provider, please arrive at least 15 minutes before your scheduled appointment time.   Beginning January 23rd 2017 lab work for the [facility] will be done in the  Main lab at [facility] on 1st floor. If you have a lab appointment with the [facility] please come in thru the  Main Entrance and check in at the main information desk  You need to re-schedule your appointment should you arrive 10 or more minutes late.  We strive to give you quality time with our providers, and arriving late affects you and other patients whose appointments are after yours.  Also, if you no show three or more times for appointments you may be dismissed from the clinic at the providers discretion.     Again, thank you for choosing [facility].  Our hope is that these requests will decrease the amount of time that you wait before being seen by our physicians.       _____________________________________________________________  Should you have questions after your visit to [facility], please contact our office at [phone] between the hours of 8:30 a.m. and 4:30 p.m.  Voicemails left after 4:30 p.m. will not be returned until the following business day.  For prescription refill requests, have your pharmacy contact our office.         Resources For Cancer Patients and their Caregivers ? American Cancer Society: Can assist with transportation, wigs, general needs, runs Look Good Feel Better.         585-480-2382 ? Cancer Care: Provides financial assistance, online support groups, medication/co-pay assistance.  1-800-813-HOPE (315)708-9223) ? [facility] Assists [place] cancer patients and their families through emotional , educational and financial support.  251-737-4293 ? [place] DSS Where to apply for food stamps, Medicaid and utility assistance. 4066686240 ? [facility]: Transportation to medical appointments. (850)725-5146 ? Social Security Administration: May apply for disability if have a Stage IV cancer. 564 878 4261 (302)847-5905 ? [employer], Disability and Transit Services: Assists with nutrition, care and transit needs. [place] Support Programs: '@10RELATIVEDAYS'$ @ > Cancer Support Group  2nd Tuesday of the month 1pm-2pm, Journey Room  > Creative Journey  3rd Tuesday of the month 1130am-1pm, Journey Room  > Look Good Feel Better  1st Wednesday of the month 10am-12 noon, Journey Room (Call [place] to register [phone])

## 2015-11-26 NOTE — Progress Notes (Signed)
[name] presented for Portacath access and flush. Portacath located right chest wall accessed with  H 20 needle. Good blood return present. Portacath flushed with 65m NS and 500U/567mHeparin and needle removed intact. Procedure without incident. Patient tolerated procedure well.  Labs done as well.

## 2015-11-26 NOTE — Patient Instructions (Signed)
[place] at [facility] Discharge Instructions  RECOMMENDATIONS MADE BY THE CONSULTANT AND ANY TEST RESULTS WILL BE SENT TO YOUR REFERRING PHYSICIAN.  Port flush and labs done today.  Thank you for choosing [place] at [facility] to provide your oncology and hematology care.  To afford each patient quality time with our provider, please arrive at least 15 minutes before your scheduled appointment time.   Beginning January 23rd 2017 lab work for the [facility] will be done in the  Main lab at [facility] on 1st floor. If you have a lab appointment with the [facility] please come in thru the  Main Entrance and check in at the main information desk  You need to re-schedule your appointment should you arrive 10 or more minutes late.  We strive to give you quality time with our providers, and arriving late affects you and other patients whose appointments are after yours.  Also, if you no show three or more times for appointments you may be dismissed from the clinic at the providers discretion.     Again, thank you for choosing [facility].  Our hope is that these requests will decrease the amount of time that you wait before being seen by our physicians.       _____________________________________________________________  Should you have questions after your visit to [facility], please contact our office at [phone] between the hours of 8:30 a.m. and 4:30 p.m.  Voicemails left after 4:30 p.m. will not be returned until the following business day.  For prescription refill requests, have your pharmacy contact our office.         Resources For Cancer Patients and their Caregivers ? American Cancer Society: Can assist with transportation, wigs, general needs, runs Look Good Feel Better.        412-082-9084 ? Cancer Care: Provides financial assistance, online support groups, medication/co-pay assistance.   1-800-813-HOPE 682-148-7110) ? [place] Assists [place] cancer patients and their families through emotional , educational and financial support.  762-807-4387 ? [place] DSS Where to apply for food stamps, Medicaid and utility assistance. ([phone] ? RCATS: Transportation to medical appointments. 616-236-7591 ? Social Security Administration: May apply for disability if have a Stage IV cancer. [phone] 579-671-7562 ? [employer], Disability and Transit Services: Assists with nutrition, care and transit needs. [place] Support Programs: '@10RELATIVEDAYS'$ @ > Cancer Support Group  2nd Tuesday of the month 1pm-2pm, Journey Room  > Creative Journey  3rd Tuesday of the month 1130am-1pm, Journey Room  > Look Good Feel Better  1st Wednesday of the month 10am-12 noon, Journey Room (Call [place] to register [phone])

## 2015-11-27 ENCOUNTER — Encounter (HOSPITAL_COMMUNITY): Payer: Self-pay | Admitting: Hematology & Oncology

## 2015-12-12 DIAGNOSIS — Z6827 Body mass index (BMI) 27.0-27.9, adult: Secondary | ICD-10-CM | POA: Diagnosis not present

## 2015-12-12 DIAGNOSIS — E1142 Type 2 diabetes mellitus with diabetic polyneuropathy: Secondary | ICD-10-CM | POA: Diagnosis not present

## 2015-12-12 DIAGNOSIS — Z1389 Encounter for screening for other disorder: Secondary | ICD-10-CM | POA: Diagnosis not present

## 2016-01-01 NOTE — Telephone Encounter (Signed)
Called regarding missed appointment. No answer.  Thank you,  Genene Churn, CCC-SLP [phone]

## 2016-01-23 ENCOUNTER — Ambulatory Visit (HOSPITAL_COMMUNITY)
Admission: RE | Admit: 2016-01-23 | Discharge: 2016-01-23 | Disposition: A | Discharge status: Home / Self Care | Payer: Medicare Other | Source: Ambulatory Visit | Attending: Hematology & Oncology | Admitting: Hematology & Oncology

## 2016-01-23 DIAGNOSIS — C7801 Secondary malignant neoplasm of right lung: Secondary | ICD-10-CM | POA: Insufficient documentation

## 2016-01-23 DIAGNOSIS — I700 Atherosclerosis of aorta: Secondary | ICD-10-CM | POA: Diagnosis not present

## 2016-01-23 DIAGNOSIS — C760 Malignant neoplasm of head, face and neck: Secondary | ICD-10-CM | POA: Diagnosis not present

## 2016-01-23 DIAGNOSIS — C099 Malignant neoplasm of tonsil, unspecified: Secondary | ICD-10-CM | POA: Diagnosis not present

## 2016-01-23 DIAGNOSIS — T17908D Unspecified foreign body in respiratory tract, part unspecified causing other injury, subsequent encounter: Secondary | ICD-10-CM

## 2016-01-23 DIAGNOSIS — I2510 Atherosclerotic heart disease of native coronary artery without angina pectoris: Secondary | ICD-10-CM | POA: Insufficient documentation

## 2016-01-23 LAB — GLUCOSE, CAPILLARY: Glucose-Capillary: 87 mg/dL (ref 65–99)

## 2016-01-23 MED ORDER — FLUDEOXYGLUCOSE F - 18 (FDG) INJECTION
11.6200 | Freq: Once | INTRAVENOUS | Status: AC | PRN
  Administered 2016-01-23: 11.62 via INTRAVENOUS

## 2016-01-25 ENCOUNTER — Encounter (HOSPITAL_COMMUNITY): Payer: Self-pay | Admitting: Hematology & Oncology

## 2016-01-25 ENCOUNTER — Encounter (HOSPITAL_COMMUNITY): Payer: Medicare Other | Attending: Hematology & Oncology | Admitting: Hematology & Oncology

## 2016-01-25 ENCOUNTER — Encounter (HOSPITAL_BASED_OUTPATIENT_CLINIC_OR_DEPARTMENT_OTHER): Payer: Medicare Other

## 2016-01-25 VITALS — BP 140/88 | HR 74 | Temp 97.7°F | Resp 18 | Wt 242.1 lb

## 2016-01-25 DIAGNOSIS — Z9221 Personal history of antineoplastic chemotherapy: Secondary | ICD-10-CM | POA: Diagnosis not present

## 2016-01-25 DIAGNOSIS — Z452 Encounter for adjustment and management of vascular access device: Secondary | ICD-10-CM | POA: Diagnosis not present

## 2016-01-25 DIAGNOSIS — C7801 Secondary malignant neoplasm of right lung: Secondary | ICD-10-CM | POA: Diagnosis not present

## 2016-01-25 DIAGNOSIS — C099 Malignant neoplasm of tonsil, unspecified: Secondary | ICD-10-CM | POA: Insufficient documentation

## 2016-01-25 DIAGNOSIS — Z923 Personal history of irradiation: Secondary | ICD-10-CM | POA: Insufficient documentation

## 2016-01-25 DIAGNOSIS — C7802 Secondary malignant neoplasm of left lung: Secondary | ICD-10-CM | POA: Insufficient documentation

## 2016-01-25 DIAGNOSIS — Z95828 Presence of other vascular implants and grafts: Secondary | ICD-10-CM

## 2016-01-25 DIAGNOSIS — T17908D Unspecified foreign body in respiratory tract, part unspecified causing other injury, subsequent encounter: Secondary | ICD-10-CM

## 2016-01-25 LAB — COMPREHENSIVE METABOLIC PANEL
ALBUMIN: 3.9 g/dL (ref 3.5–5.0)
ALT: 28 U/L (ref 17–63)
AST: 34 U/L (ref 15–41)
Alkaline Phosphatase: 58 U/L (ref 38–126)
Anion gap: 7 (ref 5–15)
BUN: 15 mg/dL (ref 6–20)
CHLORIDE: 101 mmol/L (ref 101–111)
CO2: 28 mmol/L (ref 22–32)
Calcium: 9 mg/dL (ref 8.9–10.3)
Creatinine, Ser: 0.83 mg/dL (ref 0.61–1.24)
GFR calc Af Amer: >60 mL/min (ref >60)
GFR calc non Af Amer: >60 mL/min (ref >60)
GLUCOSE: 97 mg/dL (ref 65–99)
Potassium: 3.9 mmol/L (ref 3.5–5.1)
SODIUM: 136 mmol/L (ref 135–145)
Total Bilirubin: 0.6 mg/dL (ref 0.3–1.2)
Total Protein: 7.8 g/dL (ref 6.5–8.1)

## 2016-01-25 LAB — CBC WITH DIFFERENTIAL/PLATELET
BASOS ABS: 0.1 10*3/uL (ref 0.0–0.1)
BASOS PCT: 1 %
EOS ABS: 0.2 10*3/uL (ref 0.0–0.7)
EOS PCT: 4 %
HEMATOCRIT: 42.3 % (ref 39.0–52.0)
Hemoglobin: 14 g/dL (ref 13.0–17.0)
Lymphocytes Relative: 17 %
Lymphs Abs: 0.8 10*3/uL (ref 0.7–4.0)
MCH: 29.6 pg (ref 26.0–34.0)
MCHC: 33.1 g/dL (ref 30.0–36.0)
MCV: 89.4 fL (ref 78.0–100.0)
MONO ABS: 0.5 10*3/uL (ref 0.1–1.0)
Monocytes Relative: 11 %
NEUTROS ABS: 3.1 10*3/uL (ref 1.7–7.7)
Neutrophils Relative %: 67 %
PLATELETS: 159 10*3/uL (ref 150–400)
RBC: 4.73 MIL/uL (ref 4.22–5.81)
RDW: 15.1 % (ref 11.5–15.5)
WBC: 4.6 10*3/uL (ref 4.0–10.5)

## 2016-01-25 MED ORDER — HEPARIN SOD (PORK) LOCK FLUSH 100 UNIT/ML IV SOLN
INTRAVENOUS | Status: AC
  Filled 2016-01-25: qty 5

## 2016-01-25 MED ORDER — SODIUM CHLORIDE 0.9% FLUSH
10.0000 mL | INTRAVENOUS | Status: DC | PRN
  Administered 2016-01-25: 10 mL via INTRAVENOUS
  Filled 2016-01-25: qty 10

## 2016-01-25 MED ORDER — HEPARIN SOD (PORK) LOCK FLUSH 100 UNIT/ML IV SOLN
500.0000 [IU] | Freq: Once | INTRAVENOUS | Status: AC
  Administered 2016-01-25: 500 [IU] via INTRAVENOUS

## 2016-01-25 NOTE — Progress Notes (Signed)
[place] at [place] NOTE  Patient Care Team: Sharilyn Sites, MD as PCP - General (Family Medicine) Leota Sauers, RN as Registered Nurse (Oncology) Heath Lark, MD as Consulting Physician (Hematology and Oncology) Eppie Gibson, MD as Attending Physician (Radiation Oncology) Patrici Ranks, MD as Consulting Physician (Internal Medicine)  Diagnosis: Squamous Cell Carcinoma of the right tonsil, p16+, T3N2cM0, Stage IVA  Right tonsil and bilateral neck / 70 Gy in 35 fractions to gross disease, 63 Gy in 35 fractions to high risk nodal echelons, and 56 Gy in 35 fractions to intermediate risk nodal echelons 05/02/2013-06/22/2013   Oncology History   Tonsil cancer, HPV positive   Primary site: Pharynx - Oropharynx (Right)   Staging method: AJCC 7th Edition   Clinical free text: HPV positive   Clinical: Stage IVA (T3, N2c, M0) signed by Heath Lark, MD on 04/20/2013  9:29 PM   Summary: Stage IVA (T3, N2c, M0)       Tonsil cancer ([place])   02/01/2013 Imaging    Ultrasound of the neck revealed bilateral lymphadenopathy in the submandibular region      03/16/2013 Imaging    CT scan of the neck show large right tonsil mass measured 3.9 cm in maximum dimension as well as bilateral lymphadenopathy, the largest lymph node measures 31 mm. There is also a left thyroid mass measured 44 mm      03/21/2013 Procedure    The patient was seen by ENT with laryngoscopy and biopsy. Pathology is pending      03/25/2013 Imaging    PET scan showed large hypermetabolic soft tissue mass in the region of the right palatine tonsil with bilateral cervical hypermetabolic lymphadenopathy indicative of metastatic disease,      04/04/2013 Surgery    He underwent placement of Port-A-Cath and feeding tube      04/14/2013 Procedure    The patient underwent teeth extraction.      05/02/2013 - 06/13/2013 Chemotherapy    The dosage of chemotherapy was interrupted many times due to side effects.  His treatment is terminated early due to severe ulcerated skin toxicity.      05/02/2013 - 06/22/2013 Radiation Therapy    Right tonsil and bilateral neck / 70 Gy in 35 fractions to gross disease, 63 Gy in 35 fractions to high risk nodal echelons, and 56 Gy in 35 fractions to intermediate risk nodal echelons 05/02/2013-06/22/2013       10/18/2013 Imaging    PET CT scan showed complete response in the oropharynx. However, he has evidence of new bilateral pulmonary metastasis.      12/16/2013 Imaging    Repeat PET CT scan show significant progression of pulmonary metastases       01/25/2014 Pathology Results    Squamous Cell Carcinoma, CT guided biopsy of RUL nodule      02/08/2014 - 04/20/2014 Chemotherapy    Cisplatin/Taxol weekly x 8      04/17/2014 Imaging    PET Interval improvement in size and hypermetabolism of the patient's multiple bilateral pulmonary metastases. No evidence for abnormal FDG uptake in the tongue base on the current study.Stable enlargement of the left thyroid lobe.       05/04/2014 - 05/11/2014 Chemotherapy    Xeloda 2000 mg in AM and 1500 mg in PM 7 days on and 7 days off      05/11/2014 Adverse Reaction    Decreased appetite and increased weakness.      12/14/2014 Imaging  possibility of aspiration pneumonitis.Stable subcentimeter non-hypermetabolic pulmonary nodules. No new pulmonary nodules.No metabolic evidence of local tumor recurrence in the pharynx.No metabolic evidence of recurrent nodal metastases       04/04/2015 PET scan    mild pathcy/nodular opacity in medial LLL, new. infection remains favored diagnosis. tumor not excluded. increased interstitial markings with superimposed tree-in-bud nodularity/ground glass opacity in RML and BLL, chronic aspiration      04/25/2015 Imaging    Modified barium swallow, post XRT appearance of epiglottis with thickened blunted appearance. epiglottic turnover poor, large and silent or weakly responvie aspiration        05/22/2015 Imaging    CT chest, marked improvement in bibasilar airspace nodular opacities c/w resolving pulm infection, sub cm nodules unchanged      07/25/2015 PET scan    No evidence of recurrent or metastatic disease, mild scarring with faint ground glass opacity in the bilateral lower lobes likely sequela of prior/chronic aspiration      01/23/2016 PET scan    1. Stable exam. No evidence for residual/recurrent hypermetabolic mass or adenopathy. 2. Stable biapical pleuroparenchymal scarring and 7 mm right middle lobe lung nodule. 3. Aortic atherosclerosis and coronary artery calcifications.       HISTORY OF PRESENTING ILLNESS:  [name] 66 y.o. 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 He has been diagnosed with aspiration and has finally made dietary changes. He continues to chop his foods and thicken his liquids.   [name] returns to the [facility] today unaccompanied. I have reviewed the PET scan with the patient.  He says he feels good. He has been drinking a lot of water, thickening his liquids, and chopping up his foods.   He has been staying busy. His appetite is good. He occasionally has a dry mouth. Denies falling or any other complaints. He stays active. He notes issues at home with his family.  He is still taking Lyrica.  He notes that he sleeps well. Energy is fairly good. No other complaints or concerns.    MEDICAL HISTORY:  Past Medical History:  Diagnosis Date  . Allergy   . Anxiety   . Arthritis    knees, HIps, Hands  . Chest pain    10/14  . Complication of anesthesia    bleeding during intubation 04/04/13 due to friability of right tonsillar cancer  . Constipation   . Depression   . Fibromyalgia   . Neuropathy (HCC)    compression neuropathy right hip;s/p replacement  . PEG (percutaneous endoscopic gastrostomy) status ([place])   . Pneumonia 2012  . S/P radiation therapy 05/02/2013-06/22/2013   70 [place] -  Squamous Cell Carcinoma of the tonsil, p16+, T3N2cM0  . Tonsillar cancer ([place])   . Type II diabetes mellitus ([place])   . Urethral stricture    s/p dilitation    SURGICAL HISTORY: Past Surgical History:  Procedure Laterality Date  . CYSTOSCOPY     Dr. Karsten Ro  . GASTROSTOMY TUBE PLACEMENT  04/04/2013  . LAPAROSCOPIC GASTROSTOMY N/A 04/04/2013   Procedure: LAPAROSCOPIC GASTROSTOMY TUBE PLACEMENT ;  Surgeon: Ralene Ok, MD;  Location: [facility];  Service: General;  Laterality: N/A;  . MULTIPLE EXTRACTIONS WITH ALVEOLOPLASTY N/A 04/14/2013   Procedure: Extraction of tooth #'s 1,2,3,4,5,6,7,8,9,10,11,12,13,14,15,17,18,19,20,21,22,23,24,25,26,27,28,29, 30, 31, and 32 with alveoloplasty and bilateral mandibular tori reductions.;  Surgeon: Lenn Cal, DDS;  Location: [facility];  Service: Oral Surgery;  Laterality: N/A;  . NASAL HEMORRHAGE CONTROL N/A  04/04/2013   Procedure: Control of oropharyngeal hemorrhage;  Surgeon: Ascencion Dike, MD;  Location: [facility];  Service: ENT;  Laterality: N/A;  . PORTACATH PLACEMENT Right 04/04/2013  . PORTACATH PLACEMENT N/A 04/04/2013   Procedure: INSERTION PORT-A-CATH;  Surgeon: Ralene Ok, MD;  Location: [facility];  Service: General;  Laterality: N/A;  . TOTAL HIP ARTHROPLASTY Right 2000   Dr. Percell Miller    SOCIAL HISTORY: Social History   Social History  . Marital status: Married    Spouse name: N/A  . Number of children: N/A  . Years of education: N/A   Occupational History  . Not on file.   Social History Main Topics  . Smoking status: Never Smoker  . Smokeless tobacco: Never Used  . Alcohol use No     Comment: Occasional beer  . Drug use: No  . Sexual activity: Yes     Comment: married   Other Topics Concern  . Not on file   Social History Narrative  . No narrative on file  Married for 20 years. They have no children. They have a dog. He has a daughter from a prior relationship, they are estranged. No smoking, worked at [employer]. He has  never smoked. Occasional alcohol.  FAMILY HISTORY: Family History  Problem Relation Age of Onset  . Cancer Cousin     living brain cancer, male  . Cancer Mother     Deceased with leukemia, had uterine ca  . Hyperlipidemia Brother    indicated that his mother is deceased. He indicated that his father is deceased. He indicated that the status of his brother is unknown. He indicated that his cousin is alive.    His mother had cervical cancer and leukemia. 1 brother who is healthy.  ALLERGIES:  is allergic to neurontin [gabapentin] and dilaudid [hydromorphone hcl].  MEDICATIONS:  Current Outpatient Prescriptions  Medication Sig Dispense Refill  . acetaminophen (TYLENOL) 500 MG tablet Take 500-1,000 mg by mouth every 4 (four) hours as needed for mild pain. Reported on 12/27/2014    . alprazolam (XANAX) 2 MG tablet Take 2 mg by mouth 3 (three) times daily as needed for sleep or anxiety.     . Cholecalciferol (VITAMIN D3) 5000 UNITS CAPS Take 5,000 Units by mouth daily. Reported on 05/22/2015    . citalopram (CELEXA) 20 MG tablet Take 1 tablet (20 mg total) by mouth daily. 30 tablet 1  . diphenhydrAMINE (BENADRYL) 25 mg capsule Take 1 capsule (25 mg total) by mouth every 6 (six) hours as needed for itching. 60 capsule 0  . ondansetron (ZOFRAN) 8 MG tablet Take 1 tablet (8 mg total) by mouth every 8 (eight) hours as needed for nausea or vomiting. 60 tablet 0  . OxyCODONE (OXYCONTIN) 20 mg T12A 12 hr tablet Take 20 mg by mouth every 12 (twelve) hours.     [name] oxyCODONE-acetaminophen (PERCOCET) 10-325 MG per tablet Take 1 tablet by mouth every 4 (four) hours as needed for pain. 30 tablet 0  . pregabalin (LYRICA) 150 MG capsule Take 2 capsules (300 mg total) by mouth 2 (two) times daily. (Patient taking differently: Take 300 mg by mouth 2 (two) times daily. 2 in am and 2 at night) 60 capsule 3  . zolpidem (AMBIEN CR) 6.25 MG CR tablet Take 6.25 mg by mouth at bedtime as needed for sleep. Reported on  05/22/2015     No current facility-administered medications for this visit.    Facility-Administered Medications Ordered in Other Visits  Medication Dose Route Frequency Provider Last Rate Last Dose  . heparin lock flush 100 unit/mL  500 Units Intravenous Once Patrici Ranks, MD      . sodium chloride flush (NS) 0.9 % injection 10 mL  10 mL Intravenous PRN Patrici Ranks, MD        Review of Systems  Constitutional: Negative.        Good appetite.   HENT:       Dry mouth  Eyes: Negative.   Respiratory: Negative.   Cardiovascular: Negative.   Gastrointestinal: Negative.   Genitourinary: Negative.   Musculoskeletal: Negative.  Negative for falls.  Skin: Negative.   Neurological: Negative.   Endo/Heme/Allergies: Negative.   Psychiatric/Behavioral: Negative.   All other systems reviewed and are negative. 14 point review of systems was performed and is negative except as detailed under history of present illness and above   PHYSICAL EXAMINATION:  ECOG PERFORMANCE STATUS: 0 - Asymptomatic  Vitals - 1 value per visit 08/23/9145  SYSTOLIC 829  DIASTOLIC 88  Pulse 74  Temperature 97.7  Respirations 18  Weight (lb) 242.1  Height   BMI 30.67  VISIT REPORT     Physical Exam  Constitutional: He is oriented to person, place, and time and well-developed, well-nourished, and in no distress.  Pt was able to get on exam table without assistance.   HENT:  Head: Normocephalic and atraumatic.  Mouth/Throat: Oropharynx is clear and moist.  Eyes: Conjunctivae and EOM are normal. Pupils are equal, round, and reactive to light. No scleral icterus.  Neck: Normal range of motion. Neck supple.  Cardiovascular: Normal rate, regular rhythm and normal heart sounds.   Pulmonary/Chest: Effort normal. No respiratory distress.  Abdominal: Soft. Bowel sounds are normal. He exhibits no distension and no mass. There is no tenderness. There is no rebound and no guarding.  Musculoskeletal: Normal  range of motion.  Lymphadenopathy:    He has no cervical adenopathy.  Neurological: He is alert and oriented to person, place, and time. Gait normal.  Skin: Skin is warm and dry.  Psychiatric: Mood, memory, affect and judgment normal.  Nursing note and vitals reviewed.  LABORATORY DATA:  I have reviewed the data as listed Results for KIYOSHI, SCHAAB (MRN [id number]) as of 11/27/2015 07:54 Results for BECKET, WECKER (MRN [id number]) as of 02/14/2016 10:27  Ref. Range 01/25/2016 11:35  Sodium Latest Ref Range: 135 - 145 mmol/L 136  Potassium Latest Ref Range: 3.5 - 5.1 mmol/L 3.9  Chloride Latest Ref Range: 101 - 111 mmol/L 101  CO2 Latest Ref Range: 22 - 32 mmol/L 28  Glucose Latest Ref Range: 65 - 99 mg/dL 97  BUN Latest Ref Range: 6 - 20 mg/dL 15  Creatinine Latest Ref Range: 0.61 - 1.24 mg/dL 0.83  Calcium Latest Ref Range: 8.9 - 10.3 mg/dL 9.0  Anion gap Latest Ref Range: 5 - 15  7  Alkaline Phosphatase Latest Ref Range: 38 - 126 U/L 58  Albumin Latest Ref Range: 3.5 - 5.0 g/dL 3.9  AST Latest Ref Range: 15 - 41 U/L 34  ALT Latest Ref Range: 17 - 63 U/L 28  Total Protein Latest Ref Range: 6.5 - 8.1 g/dL 7.8  Total Bilirubin Latest Ref Range: 0.3 - 1.2 mg/dL 0.6  EGFR (African American) Latest Ref Range: >60 mL/min >60  EGFR (Non-African Amer.) Latest Ref Range: >60 mL/min >60  WBC Latest Ref Range: 4.0 - 10.5 K/uL 4.6  RBC Latest Ref Range: 4.22 - 5.81  MIL/uL 4.73  Hemoglobin Latest Ref Range: 13.0 - 17.0 g/dL 14.0  HCT Latest Ref Range: 39.0 - 52.0 % 42.3  MCV Latest Ref Range: 78.0 - 100.0 fL 89.4  MCH Latest Ref Range: 26.0 - 34.0 pg 29.6  MCHC Latest Ref Range: 30.0 - 36.0 g/dL 33.1  RDW Latest Ref Range: 11.5 - 15.5 % 15.1  Platelets Latest Ref Range: 150 - 400 K/uL 159  Neutrophils Latest Units: % 67  Lymphocytes Latest Units: % 17  Monocytes Relative Latest Units: % 11  Eosinophil Latest Units: % 4  Basophil Latest Units: % 1  NEUT# Latest Ref Range: 1.7 - 7.7 K/uL 3.1   Lymphocyte # Latest Ref Range: 0.7 - 4.0 K/uL 0.8  Monocyte # Latest Ref Range: 0.1 - 1.0 K/uL 0.5  Eosinophils Absolute Latest Ref Range: 0.0 - 0.7 K/uL 0.2  Basophils Absolute Latest Ref Range: 0.0 - 0.1 K/uL 0.1    RADIOLOGY: I have personally reviewed the radiological images as listed and agreed with the findings in the report. Study Result   CLINICAL DATA:  Subsequent treatment strategy for head and neck cancer.  EXAM: NUCLEAR MEDICINE PET SKULL BASE TO THIGH  TECHNIQUE: 11.6 mCi F-18 FDG was injected intravenously. Full-ring PET imaging was performed from the skull base to thigh after the radiotracer. CT data was obtained and used for attenuation correction and anatomic localization.  FASTING BLOOD GLUCOSE:  Value: 87 mg/dl  COMPARISON:  07/25/2015  FINDINGS: NECK  No hypermetabolic lymph nodes or mass in the neck. 4.3 cm nodule arising from left lobe of thyroid gland. This does not appear hypermetabolic.  CHEST  No hypermetabolic mediastinal or hilar nodes. Aortic atherosclerosis. Calcification in the LAD and left circumflex coronary artery noted. Biapical pleuroparenchymal scarring is similar to previous exam. Stable 7 mm right middle lobe lung nodule which is too small to characterize by PET-CT.  ABDOMEN/PELVIS  No abnormal hypermetabolic activity within the liver, pancreas, adrenal glands, or spleen. Distension of the urinary bladder is identified. No hypermetabolic lymph nodes in the abdomen or pelvis.  SKELETON  No focal hypermetabolic activity to suggest skeletal metastasis.  IMPRESSION: 1. Stable exam. No evidence for residual/recurrent hypermetabolic mass or adenopathy. 2. Stable biapical pleuroparenchymal scarring and 7 mm right middle lobe lung nodule. 3. Aortic atherosclerosis and coronary artery calcifications.   Electronically Signed   By: Kerby Moors M.D.   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 He completed therapy with Cisplatin and Taxol for his stage IV disease given weekly. He had an excellent response. His performance status is a 0-1.   Port flushed today, Labs obtained and results reviewed and noted above.   No refills. He is to continue on aspiration precatutions.   He will return for a follow up in 3 months.   All questions were answered. The patient knows to call the clinic with any problems, questions or concerns.  This document serves as a record of services personally performed by Ancil Linsey, MD. It was created on her behalf by Martinique Casey, a trained medical scribe. The creation of this record is based on the scribe's personal observations and the provider's statements to them. This document has been checked and approved by the attending provider.  I have  reviewed the above documentation for accuracy and completeness and I agree with the above.  Kelby Fam. Whitney Muse, MD

## 2016-01-25 NOTE — Progress Notes (Signed)
Joyce Copa Manley presented for Portacath access and flush.    Portacath located right chest wall accessed with  H 20 needle.  Good blood return present, labs drawn and sent to lab. Portacath flushed with 51m NS and 500U/573mHeparin and needle removed intact.  Procedure tolerated well and without incident.

## 2016-01-25 NOTE — Patient Instructions (Signed)
[place] at [facility] Discharge Instructions  RECOMMENDATIONS MADE BY THE CONSULTANT AND ANY TEST RESULTS WILL BE SENT TO YOUR REFERRING PHYSICIAN.  You were seen today by Dr. Whitney Muse. Return in 3 months for follow up.   Thank you for choosing [place] at [facility] to provide your oncology and hematology care.  To afford each patient quality time with our provider, please arrive at least 15 minutes before your scheduled appointment time.    If you have a lab appointment with the [facility] please come in thru the  Main Entrance and check in at the main information desk  You need to re-schedule your appointment should you arrive 10 or more minutes late.  We strive to give you quality time with our providers, and arriving late affects you and other patients whose appointments are after yours.  Also, if you no show three or more times for appointments you may be dismissed from the clinic at the providers discretion.     Again, thank you for choosing [facility].  Our hope is that these requests will decrease the amount of time that you wait before being seen by our physicians.       _____________________________________________________________  Should you have questions after your visit to [facility], please contact our office at [phone] between the hours of 8:30 a.m. and 4:30 p.m.  Voicemails left after 4:30 p.m. will not be returned until the following business day.  For prescription refill requests, have your pharmacy contact our office.       Resources For Cancer Patients and their Caregivers ? American Cancer Society: Can assist with transportation, wigs, general needs, runs Look Good Feel Better.        909-445-7143 ? Cancer Care: Provides financial assistance, online support groups, medication/co-pay assistance.  1-800-813-HOPE 701-691-7126) ? [place] Assists [place] cancer patients and their families through emotional , educational and financial support.  670-166-2759 ? [place] DSS Where to apply for food stamps, Medicaid and utility assistance. 864-552-5367 ? RCATS: Transportation to medical appointments. 320-471-4422 ? Social Security Administration: May apply for disability if have a Stage IV cancer. [phone] ? [employer], Disability and Transit Services: Assists with nutrition, care and transit needs. [place] Support Programs: '@10RELATIVEDAYS'$ @ > Cancer Support Group  2nd Tuesday of the month 1pm-2pm, Journey Room  > Creative Journey  3rd Tuesday of the month 1130am-1pm, Journey Room  > Look Good Feel Better  1st Wednesday of the month 10am-12 noon, Journey Room (Call [place] to register [phone])

## 2016-01-28 DIAGNOSIS — H524 Presbyopia: Secondary | ICD-10-CM | POA: Diagnosis not present

## 2016-02-14 ENCOUNTER — Encounter (HOSPITAL_COMMUNITY): Payer: Self-pay | Admitting: Hematology & Oncology

## 2016-03-05 IMAGING — XA IR GI TUBE CONTRAST INJECTION
3 series · 6 of 6 positions shown · IV contrast (agent unspecified)
Comparison: none

CLINICAL DATA: Evaluate feeding tube placement.

EXAM:
GI TUBE INJECTION
FLUOROSCOPY TIME:  6 seconds
MEDICATIONS:
None
ANESTHESIA/SEDATION:
Moderate sedation time: None
PROCEDURE:
The gastrostomy tube was injected with contrast. Fluoroscopic images
were obtained following the tube injection.

[Series 1: fl cto · 2 of 2 slices shown (1 of 3)]
[im 1/2]
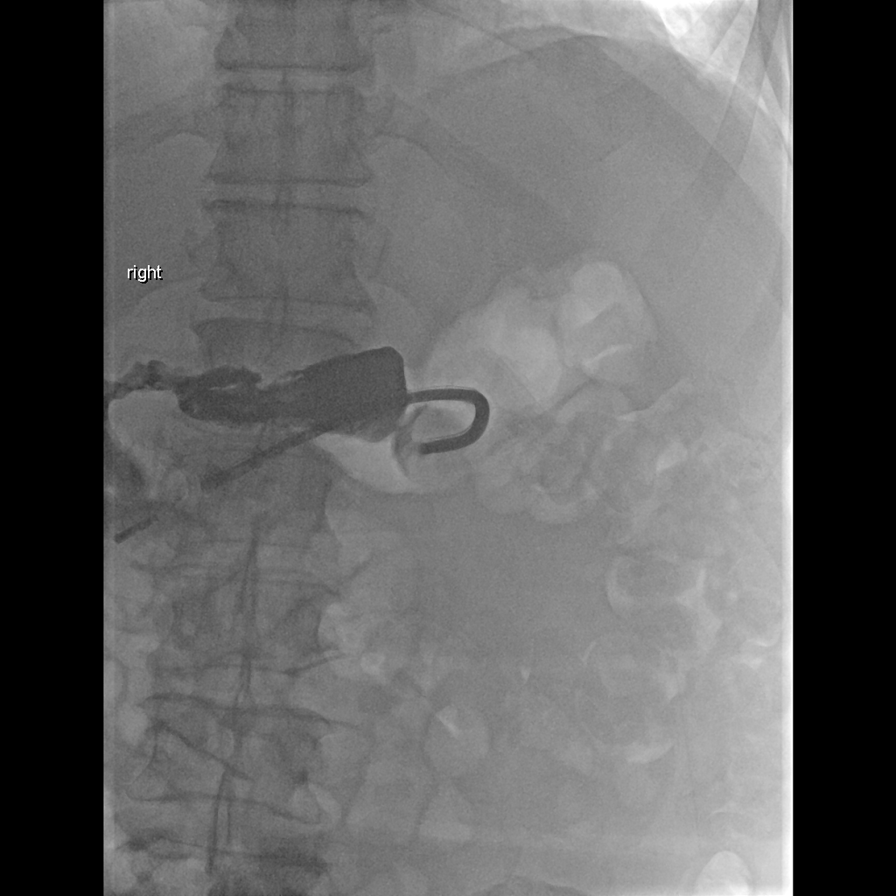
[im 2/2]
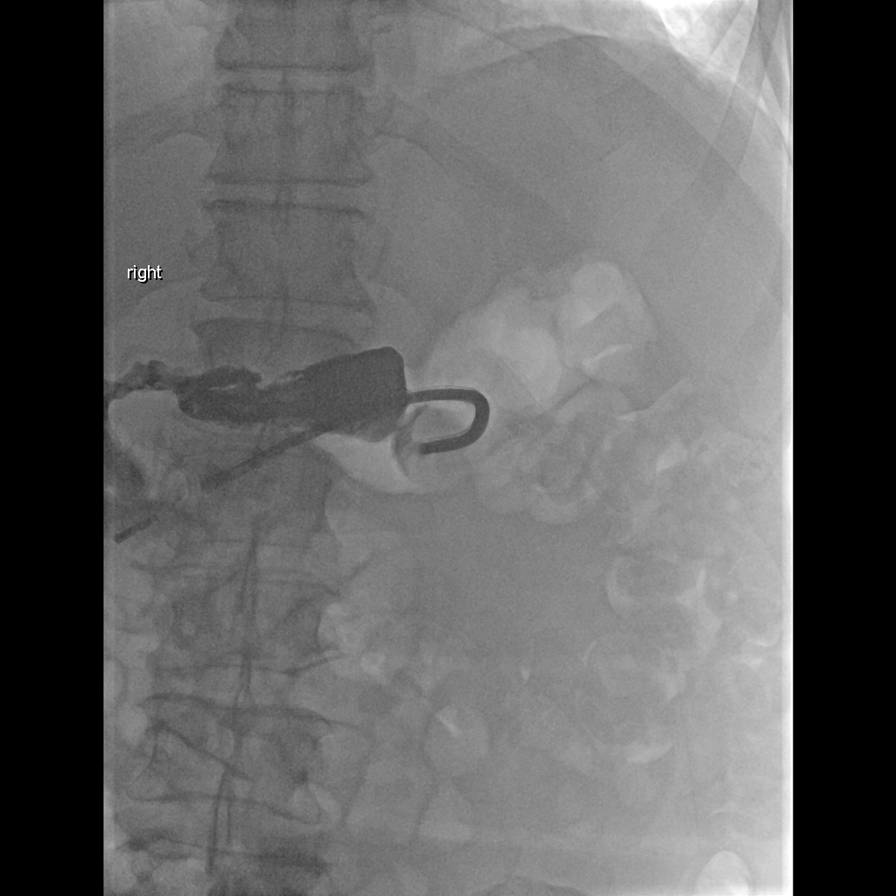

[Series 2: fl cto · 2 of 2 slices shown (2 of 3)]
[im 1/2]
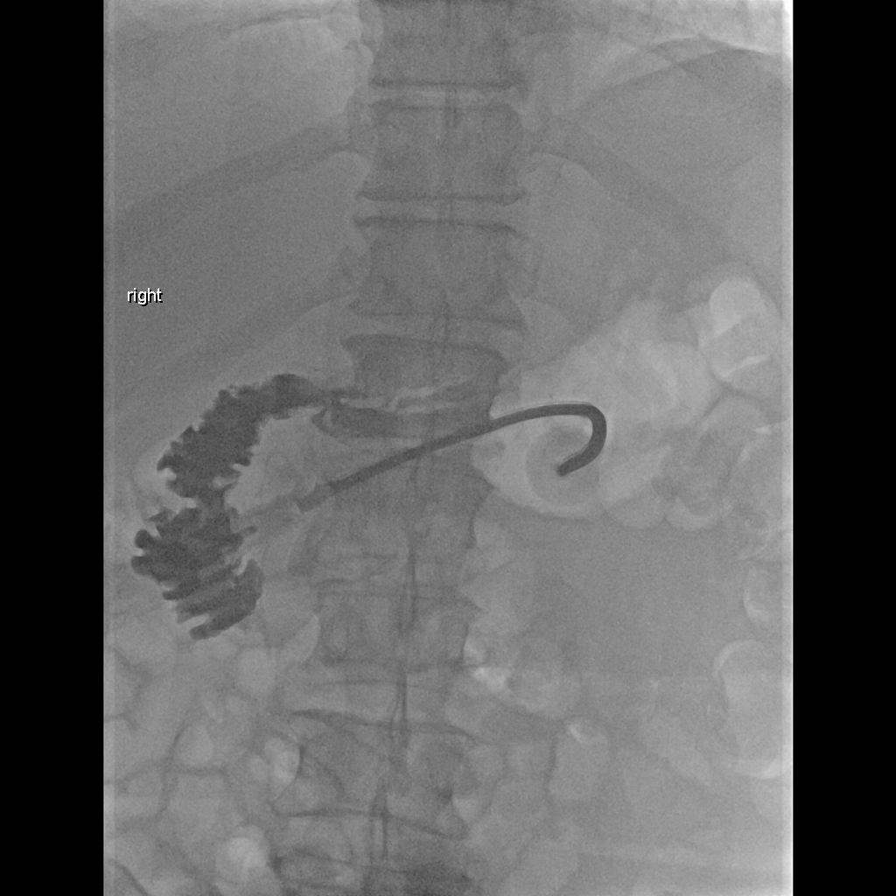
[im 2/2]
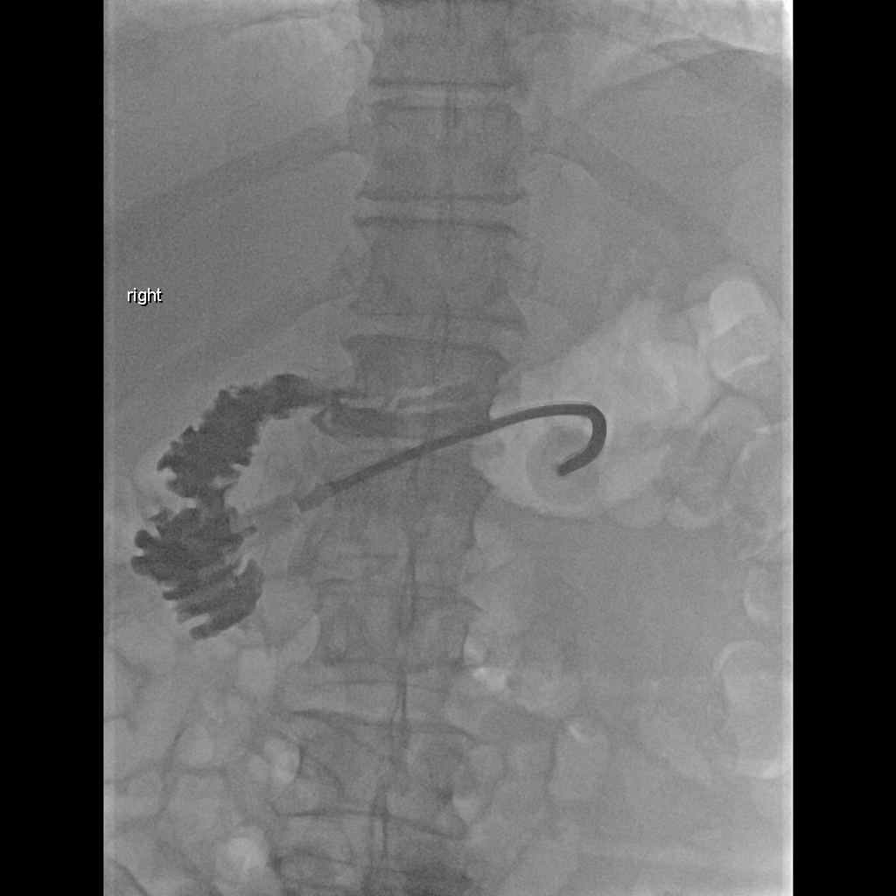

[Series 3: fl cto · 2 of 2 slices shown (3 of 3)]
[im 1/2]
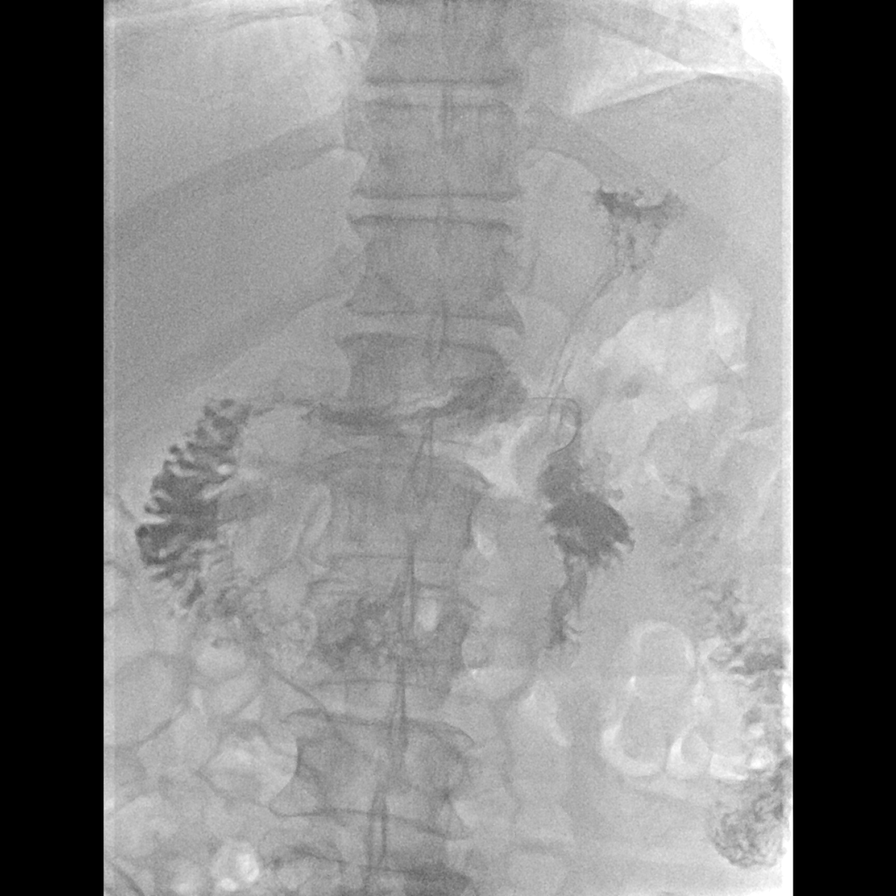
[im 2/2]
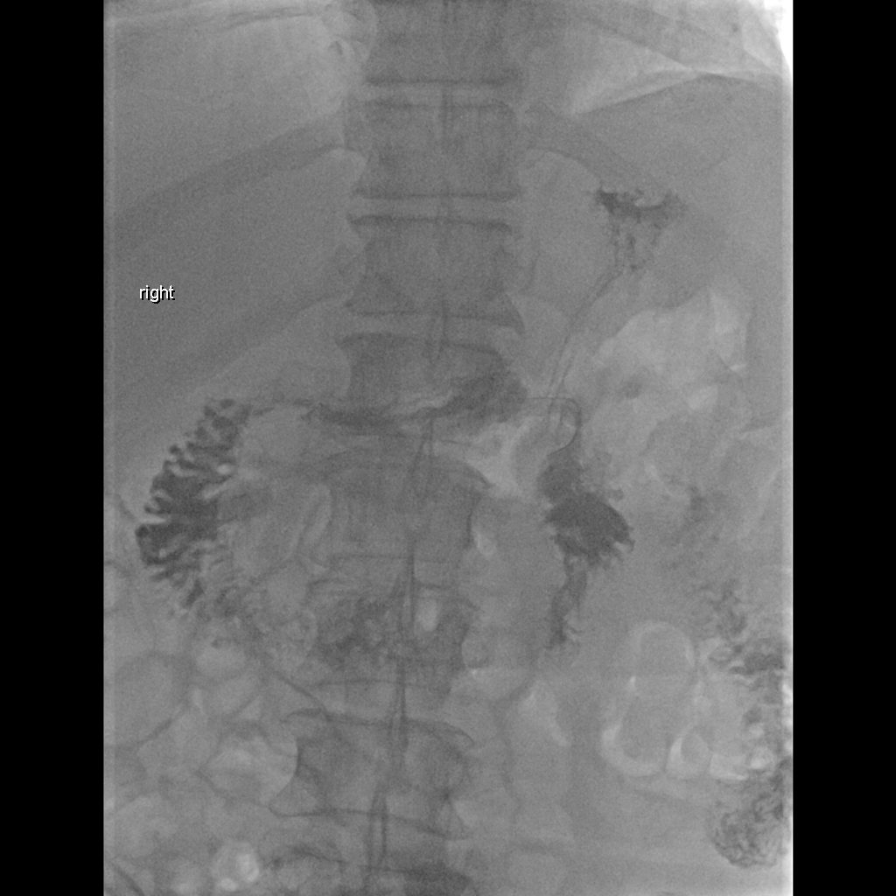

[6 of 6 positions shown; findings below may reference images not displayed]

FINDINGS: The gastrostomy tube is in the stomach. Contrast fills the distal
stomach and proximal duodenum. Delayed images demonstrate contrast
in the proximal jejunum.

COMPLICATIONS:
None
IMPRESSION: Gastrostomy tube is appropriately positioned in the stomach.

## 2016-03-13 ENCOUNTER — Encounter (HOSPITAL_COMMUNITY): Payer: Self-pay

## 2016-03-13 ENCOUNTER — Encounter (HOSPITAL_COMMUNITY): Payer: Medicare Other | Attending: Hematology & Oncology

## 2016-03-13 VITALS — BP 123/68 | HR 76 | Temp 98.0°F | Resp 18

## 2016-03-13 DIAGNOSIS — C7800 Secondary malignant neoplasm of unspecified lung: Secondary | ICD-10-CM

## 2016-03-13 DIAGNOSIS — Z452 Encounter for adjustment and management of vascular access device: Secondary | ICD-10-CM

## 2016-03-13 DIAGNOSIS — Z923 Personal history of irradiation: Secondary | ICD-10-CM | POA: Insufficient documentation

## 2016-03-13 DIAGNOSIS — Z9221 Personal history of antineoplastic chemotherapy: Secondary | ICD-10-CM | POA: Insufficient documentation

## 2016-03-13 DIAGNOSIS — C099 Malignant neoplasm of tonsil, unspecified: Secondary | ICD-10-CM | POA: Diagnosis not present

## 2016-03-13 DIAGNOSIS — C7802 Secondary malignant neoplasm of left lung: Secondary | ICD-10-CM | POA: Insufficient documentation

## 2016-03-13 DIAGNOSIS — C7801 Secondary malignant neoplasm of right lung: Secondary | ICD-10-CM | POA: Insufficient documentation

## 2016-03-13 MED ORDER — HEPARIN SOD (PORK) LOCK FLUSH 100 UNIT/ML IV SOLN
500.0000 [IU] | Freq: Once | INTRAVENOUS | Status: AC
  Administered 2016-03-13: 500 [IU] via INTRAVENOUS
  Filled 2016-03-13: qty 5

## 2016-03-13 MED ORDER — SODIUM CHLORIDE 0.9% FLUSH
20.0000 mL | INTRAVENOUS | Status: DC | PRN
  Administered 2016-03-13: 20 mL via INTRAVENOUS
  Filled 2016-03-13: qty 20

## 2016-03-13 NOTE — Progress Notes (Signed)
[name] presented for Portacath access and flush. Proper placement of portacath confirmed by CXR. Portacath located right chest wall accessed with  H 20 needle. Good blood return present. Portacath flushed with 54m NS and 500U/557mHeparin and needle removed intact. Procedure without incident. Patient tolerated procedure well.

## 2016-03-13 NOTE — Patient Instructions (Signed)
[place] at [facility] Discharge Instructions  RECOMMENDATIONS MADE BY THE CONSULTANT AND ANY TEST RESULTS WILL BE SENT TO YOUR REFERRING PHYSICIAN.  Port flush today.    Thank you for choosing [place] at [facility] to provide your oncology and hematology care.  To afford each patient quality time with our provider, please arrive at least 15 minutes before your scheduled appointment time.    If you have a lab appointment with the [place] please come in thru the  Main Entrance and check in at the main information desk  You need to re-schedule your appointment should you arrive 10 or more minutes late.  We strive to give you quality time with our providers, and arriving late affects you and other patients whose appointments are after yours.  Also, if you no show three or more times for appointments you may be dismissed from the clinic at the providers discretion.     Again, thank you for choosing [facility].  Our hope is that these requests will decrease the amount of time that you wait before being seen by our physicians.       _____________________________________________________________  Should you have questions after your visit to [facility], please contact our office at [phone] between the hours of 8:30 a.m. and 4:30 p.m.  Voicemails left after 4:30 p.m. will not be returned until the following business day.  For prescription refill requests, have your pharmacy contact our office.       Resources For Cancer Patients and their Caregivers ? American Cancer Society: Can assist with transportation, wigs, general needs, runs Look Good Feel Better.        [phone] ? Cancer Care: Provides financial assistance, online support groups, medication/co-pay assistance.  1-800-813-HOPE (718)600-7013) ? [facility] Assists [place] cancer patients and their families through  emotional , educational and financial support.  574-481-0170 ? [place] DSS Where to apply for food stamps, Medicaid and utility assistance. 657-505-3417 ? RCATS: Transportation to medical appointments. 4161861618 ? Social Security Administration: May apply for disability if have a Stage IV cancer. 954-081-3166 (215)398-8592 ? [employer], Disability and Transit Services: Assists with nutrition, care and transit needs. [place] Support Programs: '@10RELATIVEDAYS'$ @ > Cancer Support Group  2nd Tuesday of the month 1pm-2pm, Journey Room  > Creative Journey  3rd Tuesday of the month 1130am-1pm, Journey Room  > Look Good Feel Better  1st Wednesday of the month 10am-12 noon, Journey Room (Call [place] to register [phone])

## 2016-03-28 DIAGNOSIS — G894 Chronic pain syndrome: Secondary | ICD-10-CM | POA: Diagnosis not present

## 2016-03-28 DIAGNOSIS — E1142 Type 2 diabetes mellitus with diabetic polyneuropathy: Secondary | ICD-10-CM | POA: Diagnosis not present

## 2016-03-28 DIAGNOSIS — C781 Secondary malignant neoplasm of mediastinum: Secondary | ICD-10-CM | POA: Diagnosis not present

## 2016-03-28 DIAGNOSIS — Z6828 Body mass index (BMI) 28.0-28.9, adult: Secondary | ICD-10-CM | POA: Diagnosis not present

## 2016-04-25 ENCOUNTER — Encounter (HOSPITAL_COMMUNITY): Payer: Medicare Other | Attending: Hematology & Oncology

## 2016-04-25 ENCOUNTER — Encounter (HOSPITAL_COMMUNITY): Payer: Self-pay | Admitting: Oncology

## 2016-04-25 ENCOUNTER — Ambulatory Visit (HOSPITAL_COMMUNITY): Payer: Self-pay | Admitting: Oncology

## 2016-04-25 ENCOUNTER — Encounter (HOSPITAL_BASED_OUTPATIENT_CLINIC_OR_DEPARTMENT_OTHER): Payer: Medicare Other | Admitting: Oncology

## 2016-04-25 VITALS — BP 136/97 | HR 88 | Temp 98.1°F | Resp 16 | Ht 76.0 in | Wt 244.0 lb

## 2016-04-25 DIAGNOSIS — C7802 Secondary malignant neoplasm of left lung: Secondary | ICD-10-CM | POA: Insufficient documentation

## 2016-04-25 DIAGNOSIS — C7801 Secondary malignant neoplasm of right lung: Secondary | ICD-10-CM | POA: Insufficient documentation

## 2016-04-25 DIAGNOSIS — Z95828 Presence of other vascular implants and grafts: Secondary | ICD-10-CM

## 2016-04-25 DIAGNOSIS — Z923 Personal history of irradiation: Secondary | ICD-10-CM | POA: Insufficient documentation

## 2016-04-25 DIAGNOSIS — C099 Malignant neoplasm of tonsil, unspecified: Secondary | ICD-10-CM | POA: Insufficient documentation

## 2016-04-25 DIAGNOSIS — Z9221 Personal history of antineoplastic chemotherapy: Secondary | ICD-10-CM | POA: Insufficient documentation

## 2016-04-25 MED ORDER — HEPARIN SOD (PORK) LOCK FLUSH 100 UNIT/ML IV SOLN
INTRAVENOUS | Status: AC
  Filled 2016-04-25: qty 5

## 2016-04-25 MED ORDER — SODIUM CHLORIDE 0.9% FLUSH
10.0000 mL | INTRAVENOUS | Status: DC | PRN
  Administered 2016-04-25: 10 mL via INTRAVENOUS
  Filled 2016-04-25: qty 10

## 2016-04-25 MED ORDER — HEPARIN SOD (PORK) LOCK FLUSH 100 UNIT/ML IV SOLN
500.0000 [IU] | Freq: Once | INTRAVENOUS | Status: AC
  Administered 2016-04-25: 500 [IU] via INTRAVENOUS

## 2016-04-25 NOTE — Progress Notes (Signed)
Casmere M Palmeri tolerated port flush well without complaints or incident. Port accessed with good blood return noted then flushed with 10 ml NS and 5 ml Heparin easily per protocol then de-accessed. Pt discharged self ambulatory in satisfactory condition

## 2016-04-25 NOTE — Progress Notes (Signed)
[place] at [place] NOTE  Patient Care Team: Sharilyn Sites, MD as PCP - General (Family Medicine) Leota Sauers, RN as Registered Nurse (Oncology) Heath Lark, MD as Consulting Physician (Hematology and Oncology) Eppie Gibson, MD as Attending Physician (Radiation Oncology) Patrici Ranks, MD as Consulting Physician (Internal Medicine)  Diagnosis: Squamous Cell Carcinoma of the right tonsil, p16+, T3N2cM0, Stage IVA  Right tonsil and bilateral neck / 70 Gy in 35 fractions to gross disease, 63 Gy in 35 fractions to high risk nodal echelons, and 56 Gy in 35 fractions to intermediate risk nodal echelons 05/02/2013-06/22/2013   Oncology History   Tonsil cancer, HPV positive   Primary site: Pharynx - Oropharynx (Right)   Staging method: AJCC 7th Edition   Clinical free text: HPV positive   Clinical: Stage IVA (T3, N2c, M0) signed by Heath Lark, MD on 04/20/2013  9:29 PM   Summary: Stage IVA (T3, N2c, M0)       Tonsil cancer ([place])   02/01/2013 Imaging    Ultrasound of the neck revealed bilateral lymphadenopathy in the submandibular region      03/16/2013 Imaging    CT scan of the neck show large right tonsil mass measured 3.9 cm in maximum dimension as well as bilateral lymphadenopathy, the largest lymph node measures 31 mm. There is also a left thyroid mass measured 44 mm      03/21/2013 Procedure    The patient was seen by ENT with laryngoscopy and biopsy. Pathology is pending      03/25/2013 Imaging    PET scan showed large hypermetabolic soft tissue mass in the region of the right palatine tonsil with bilateral cervical hypermetabolic lymphadenopathy indicative of metastatic disease,      04/04/2013 Surgery    He underwent placement of Port-A-Cath and feeding tube      04/14/2013 Procedure    The patient underwent teeth extraction.      05/02/2013 - 06/13/2013 Chemotherapy    The dosage of chemotherapy was interrupted many times due to side effects.  His treatment is terminated early due to severe ulcerated skin toxicity.      05/02/2013 - 06/22/2013 Radiation Therapy    Right tonsil and bilateral neck / 70 Gy in 35 fractions to gross disease, 63 Gy in 35 fractions to high risk nodal echelons, and 56 Gy in 35 fractions to intermediate risk nodal echelons 05/02/2013-06/22/2013       10/18/2013 Imaging    PET CT scan showed complete response in the oropharynx. However, he has evidence of new bilateral pulmonary metastasis.      12/16/2013 Imaging    Repeat PET CT scan show significant progression of pulmonary metastases       01/25/2014 Pathology Results    Squamous Cell Carcinoma, CT guided biopsy of RUL nodule      02/08/2014 - 04/20/2014 Chemotherapy    Cisplatin/Taxol weekly x 8      04/17/2014 Imaging    PET Interval improvement in size and hypermetabolism of the patient's multiple bilateral pulmonary metastases. No evidence for abnormal FDG uptake in the tongue base on the current study.Stable enlargement of the left thyroid lobe.       05/04/2014 - 05/11/2014 Chemotherapy    Xeloda 2000 mg in AM and 1500 mg in PM 7 days on and 7 days off      05/11/2014 Adverse Reaction    Decreased appetite and increased weakness.      12/14/2014 Imaging  possibility of aspiration pneumonitis.Stable subcentimeter non-hypermetabolic pulmonary nodules. No new pulmonary nodules.No metabolic evidence of local tumor recurrence in the pharynx.No metabolic evidence of recurrent nodal metastases       04/04/2015 PET scan    mild pathcy/nodular opacity in medial LLL, new. infection remains favored diagnosis. tumor not excluded. increased interstitial markings with superimposed tree-in-bud nodularity/ground glass opacity in RML and BLL, chronic aspiration      04/25/2015 Imaging    Modified barium swallow, post XRT appearance of epiglottis with thickened blunted appearance. epiglottic turnover poor, large and silent or weakly responvie aspiration        05/22/2015 Imaging    CT chest, marked improvement in bibasilar airspace nodular opacities c/w resolving pulm infection, sub cm nodules unchanged      07/25/2015 PET scan    No evidence of recurrent or metastatic disease, mild scarring with faint ground glass opacity in the bilateral lower lobes likely sequela of prior/chronic aspiration      01/23/2016 PET scan    1. Stable exam. No evidence for residual/recurrent hypermetabolic mass or adenopathy. 2. Stable biapical pleuroparenchymal scarring and 7 mm right middle lobe lung nodule. 3. Aortic atherosclerosis and coronary artery calcifications.      HISTORY OF PRESENTING ILLNESS:  [name] 66 y.o. 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 He has been diagnosed with aspiration and has finally made dietary changes. He continues to chop his foods and thicken his liquids.   He is doing well today. Denies dysphagia, chest pain, SOB, abdominal pain, weight loss, loss of appetite, or any other concerns.   MEDICAL HISTORY:  Past Medical History:  Diagnosis Date  . Allergy   . Anxiety   . Arthritis    knees, HIps, Hands  . Chest pain    10/14  . Complication of anesthesia    bleeding during intubation 04/04/13 due to friability of right tonsillar cancer  . Constipation   . Depression   . Fibromyalgia   . Neuropathy (HCC)    compression neuropathy right hip;s/p replacement  . PEG (percutaneous endoscopic gastrostomy) status ([place])   . Pneumonia 2012  . 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 Tonsillar cancer ([place])   . Type II diabetes mellitus ([place])   . Urethral stricture    s/p dilitation    SURGICAL HISTORY: Past Surgical History:  Procedure Laterality Date  . CYSTOSCOPY     Dr. Karsten Ro  . GASTROSTOMY TUBE PLACEMENT  04/04/2013  . LAPAROSCOPIC GASTROSTOMY N/A 04/04/2013   Procedure: LAPAROSCOPIC GASTROSTOMY TUBE  PLACEMENT ;  Surgeon: Ralene Ok, MD;  Location: [facility];  Service: General;  Laterality: N/A;  . MULTIPLE EXTRACTIONS WITH ALVEOLOPLASTY N/A 04/14/2013   Procedure: Extraction of tooth #'s 1,2,3,4,5,6,7,8,9,10,11,12,13,14,15,17,18,19,20,21,22,23,24,25,26,27,28,29, 30, 31, and 32 with alveoloplasty and bilateral mandibular tori reductions.;  Surgeon: Lenn Cal, DDS;  Location: [place];  Service: Oral Surgery;  Laterality: N/A;  . NASAL HEMORRHAGE CONTROL N/A 04/04/2013   Procedure: Control of oropharyngeal hemorrhage;  Surgeon: Ascencion Dike, MD;  Location: [facility];  Service: ENT;  Laterality: N/A;  . PORTACATH PLACEMENT Right 04/04/2013  . PORTACATH PLACEMENT N/A 04/04/2013   Procedure: INSERTION PORT-A-CATH;  Surgeon: Ralene Ok, MD;  Location: [facility];  Service: General;  Laterality: N/A;  . TOTAL HIP ARTHROPLASTY Right 2000   Dr. Percell Miller    SOCIAL HISTORY: Social History  Social History  . Marital status: Married    Spouse name: N/A  . Number of children: N/A  . Years of education: N/A   Occupational History  . Not on file.   Social History Main Topics  . Smoking status: Never Smoker  . Smokeless tobacco: Never Used  . Alcohol use No     Comment: Occasional beer  . Drug use: No  . Sexual activity: Yes     Comment: married   Other Topics Concern  . Not on file   Social History Narrative  . No narrative on file  Married for 20 years. They have no children. They have a dog. He has a daughter from a prior relationship, they are estranged. No smoking, worked at [employer]. He has never smoked. Occasional alcohol.  FAMILY HISTORY: Family History  Problem Relation Age of Onset  . Cancer Cousin     living brain cancer, male  . Cancer Mother     Deceased with leukemia, had uterine ca  . Hyperlipidemia Brother    indicated that his mother is deceased. He indicated that his father is deceased. He indicated that the status of his brother is unknown. He indicated  that his cousin is alive.    His mother had cervical cancer and leukemia. 1 brother who is healthy.  ALLERGIES:  is allergic to neurontin [gabapentin] and dilaudid [hydromorphone hcl].  MEDICATIONS:  Current Outpatient Prescriptions  Medication Sig Dispense Refill  . acetaminophen (TYLENOL) 500 MG tablet Take 500-1,000 mg by mouth every 4 (four) hours as needed for mild pain. Reported on 12/27/2014    . alprazolam (XANAX) 2 MG tablet Take 2 mg by mouth 3 (three) times daily as needed for sleep or anxiety.     . Cholecalciferol (VITAMIN D3) 5000 UNITS CAPS Take 5,000 Units by mouth daily. Reported on 05/22/2015    . citalopram (CELEXA) 20 MG tablet Take 1 tablet (20 mg total) by mouth daily. 30 tablet 1  . diphenhydrAMINE (BENADRYL) 25 mg capsule Take 1 capsule (25 mg total) by mouth every 6 (six) hours as needed for itching. 60 capsule 0  . ondansetron (ZOFRAN) 8 MG tablet Take 1 tablet (8 mg total) by mouth every 8 (eight) hours as needed for nausea or vomiting. 60 tablet 0  . pregabalin (LYRICA) 150 MG capsule Take 2 capsules (300 mg total) by mouth 2 (two) times daily. (Patient taking differently: Take 300 mg by mouth 2 (two) times daily. 2 in am and 2 at night) 60 capsule 3   No current facility-administered medications for this visit.    Review of Systems  Constitutional: Negative.  Negative for weight loss.       No loss of appetite  HENT: Negative.        No dysphagia  No dry mouth No neck numbness  Eyes: Negative.   Respiratory: Negative.  Negative for shortness of breath.   Cardiovascular: Negative.  Negative for chest pain.  Gastrointestinal: Negative.  Negative for abdominal pain.  Genitourinary: Negative.   Musculoskeletal: Negative.   Skin: Negative.   Neurological: Negative.   Endo/Heme/Allergies: Negative.   Psychiatric/Behavioral: Negative.   All other systems reviewed and are negative. 14 point review of systems was performed and is negative except as detailed under  history of present illness and above  PHYSICAL EXAMINATION: ECOG PERFORMANCE STATUS: 0 - Asymptomatic  Vitals:   04/25/16 1508  BP: (!) 136/97  Pulse: 88  Resp: 16  Temp: 98.1 F (36.7 C)  Physical Exam  Constitutional: He is oriented to person, place, and time and well-developed, well-nourished, and in no distress.  HENT:  Head: Normocephalic and atraumatic.  Mouth/Throat: Oropharynx is clear and moist.  Eyes: Conjunctivae and EOM are normal. Pupils are equal, round, and reactive to light. No scleral icterus.  Neck: Normal range of motion. Neck supple.  Cardiovascular: Normal rate, regular rhythm and normal heart sounds.   Pulmonary/Chest: Effort normal. No respiratory distress.  Abdominal: Soft. Bowel sounds are normal. He exhibits no distension and no mass. There is no tenderness. There is no rebound and no guarding.  Musculoskeletal: Normal range of motion.  Lymphadenopathy:    He has no cervical adenopathy.  Neurological: He is alert and oriented to person, place, and time. Gait normal.  Skin: Skin is warm and dry.  Psychiatric: Mood, memory, affect and judgment normal.  Nursing note and vitals reviewed.  LABORATORY DATA:  I have reviewed the data as listed CBC Latest Ref Rng & Units 01/25/2016 11/26/2015 07/26/2015  WBC 4.0 - 10.5 K/uL 4.6 4.2 4.3  Hemoglobin 13.0 - 17.0 g/dL 14.0 13.7 13.3  Hematocrit 39.0 - 52.0 % 42.3 42.6 40.5  Platelets 150 - 400 K/uL 159 134(L) 142(L)   CMP Latest Ref Rng & Units 01/25/2016 11/26/2015 07/26/2015  Glucose 65 - 99 mg/dL 97 82 87  BUN 6 - 20 mg/dL '15 14 15  '$ Creatinine 0.61 - 1.24 mg/dL 0.83 0.82 0.78  Sodium 135 - 145 mmol/L 136 138 139  Potassium 3.5 - 5.1 mmol/L 3.9 3.9 3.7  Chloride 101 - 111 mmol/L 101 104 104  CO2 22 - 32 mmol/L '28 27 29  '$ Calcium 8.9 - 10.3 mg/dL 9.0 8.8(L) 9.0  Total Protein 6.5 - 8.1 g/dL 7.8 7.6 7.6  Total Bilirubin 0.3 - 1.2 mg/dL 0.6 0.7 0.9  Alkaline Phos 38 - 126 U/L 58 63 74  AST 15 - 41 U/L  34 37 19  ALT 17 - 63 U/L 28 27 11(L)   RADIOLOGY: I have personally reviewed the radiological images as listed and agreed with the findings in the report.  PET Scan 01/23/2016 IMPRESSION: 1. Stable exam. No evidence for residual/recurrent hypermetabolic mass or adenopathy. 2. Stable biapical pleuroparenchymal scarring and 7 mm right middle lobe lung nodule. 3. Aortic atherosclerosis and coronary artery calcifications.  ASSESSMENT & PLAN:  Stage IV squamous cell carcinoma of the head and neck Aspiration Aspiration pneumonia per imaging  66 year old male with 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 He completed therapy with Cisplatin and Taxol for his stage IV disease given weekly. He had an excellent response. His performance status is a 0-1.   Clinicallly NED.  Port flush today.  He will return for follow up in 6 months with labs.   All questions were answered. The patient knows to call the clinic with any problems, questions or concerns.  This document serves as a record of services personally performed by Twana First, MD. It was created on her behalf by Martinique Casey, a trained medical scribe. The creation of this record is based on the scribe's personal observations and the provider's statements to them. This document has been checked and approved by the attending provider.  I have reviewed the above documentation for accuracy and completeness and I agree with the above.

## 2016-04-25 NOTE — Patient Instructions (Signed)
[facility] at [facility] Discharge Instructions  RECOMMENDATIONS MADE BY THE CONSULTANT AND ANY TEST RESULTS WILL BE SENT TO YOUR REFERRING PHYSICIAN.  Portacath flushed per protocol today. Follow-up as scheduled. Call clinic for any questions or concerns  Thank you for choosing [facility] at [facility] to provide your oncology and hematology care.  To afford each patient quality time with our provider, please arrive at least 15 minutes before your scheduled appointment time.    If you have a lab appointment with the Cancer Center please come in thru the  Main Entrance and check in at the main information desk  You need to re-schedule your appointment should you arrive 10 or more minutes late.  We strive to give you quality time with our providers, and arriving late affects you and other patients whose appointments are after yours.  Also, if you no show three or more times for appointments you may be dismissed from the clinic at the providers discretion.     Again, thank you for choosing [facility].  Our hope is that these requests will decrease the amount of time that you wait before being seen by our physicians.       _____________________________________________________________  Should you have questions after your visit to [facility], please contact our office at [phone] between the hours of 8:30 a.m. and 4:30 p.m.  Voicemails left after 4:30 p.m. will not be returned until the following business day.  For prescription refill requests, have your pharmacy contact our office.       Resources For Cancer Patients and their Caregivers ? American Cancer Society: Can assist with transportation, wigs, general needs, runs Look Good Feel Better.        1-888-227-6333 ? Cancer Care: Provides financial assistance, online support groups, medication/co-pay assistance.  1-800-813-HOPE (4673) ? [facility] Assists [place] cancer patients and their families through emotional , educational and financial support.  336-427-4357 ? [place] DSS Where to apply for food stamps, Medicaid and utility assistance. 336-342-1394 ? RCATS: Transportation to medical appointments. 336-347-2287 ? Social Security Administration: May apply for disability if have a Stage IV cancer. 336-342-7796 1-800-772-1213 ? [place] Aging, Disability and Transit Services: Assists with nutrition, care and transit needs. 336-349-2343  Cancer Center Support Programs: @10RELATIVEDAYS@ > Cancer Support Group  2nd Tuesday of the month 1pm-2pm, Journey Room  > Creative Journey  3rd Tuesday of the month 1130am-1pm, Journey Room  > Look Good Feel Better  1st Wednesday of the month 10am-12 noon, Journey Room (Call [employer] to register [phone])   

## 2016-04-25 NOTE — Patient Instructions (Signed)
[place] at [facility] Discharge Instructions  RECOMMENDATIONS MADE BY THE CONSULTANT AND ANY TEST RESULTS WILL BE SENT TO YOUR REFERRING PHYSICIAN.  You were seen today by Dr. Talbert Cage. Continue port flushes every 6-8 weeks. Return in 6 months for labs and follow up.   Thank you for choosing [place] at [facility] to provide your oncology and hematology care.  To afford each patient quality time with our provider, please arrive at least 15 minutes before your scheduled appointment time.    If you have a lab appointment with the [facility] please come in thru the  Main Entrance and check in at the main information desk  You need to re-schedule your appointment should you arrive 10 or more minutes late.  We strive to give you quality time with our providers, and arriving late affects you and other patients whose appointments are after yours.  Also, if you no show three or more times for appointments you may be dismissed from the clinic at the providers discretion.     Again, thank you for choosing [facility].  Our hope is that these requests will decrease the amount of time that you wait before being seen by our physicians.       _____________________________________________________________  Should you have questions after your visit to [facility], please contact our office at [phone] between the hours of 8:30 a.m. and 4:30 p.m.  Voicemails left after 4:30 p.m. will not be returned until the following business day.  For prescription refill requests, have your pharmacy contact our office.       Resources For Cancer Patients and their Caregivers ? American Cancer Society: Can assist with transportation, wigs, general needs, runs Look Good Feel Better.        405-032-6694 ? Cancer Care: Provides financial assistance, online support groups, medication/co-pay assistance.  1-800-813-HOPE (438)552-8415) ? [place] Assists [place] cancer patients and their families through emotional , educational and financial support.  667-762-6691 ? [place] DSS Where to apply for food stamps, Medicaid and utility assistance. 757-363-3545 ? RCATS: Transportation to medical appointments. 229 502 2204 ? Social Security Administration: May apply for disability if have a Stage IV cancer. (718) 758-1843 769-796-3426 ? [employer], Disability and Transit Services: Assists with nutrition, care and transit needs. [place] Support Programs: '@10RELATIVEDAYS'$ @ > Cancer Support Group  2nd Tuesday of the month 1pm-2pm, Journey Room  > Creative Journey  3rd Tuesday of the month 1130am-1pm, Journey Room  > Look Good Feel Better  1st Wednesday of the month 10am-12 noon, Journey Room (Call [place] to register [phone])

## 2016-06-23 ENCOUNTER — Encounter (HOSPITAL_COMMUNITY): Payer: Medicare Other | Attending: Hematology & Oncology

## 2016-06-23 ENCOUNTER — Encounter (HOSPITAL_COMMUNITY): Payer: Self-pay

## 2016-06-23 VITALS — BP 112/54 | HR 84 | Temp 98.1°F | Resp 18

## 2016-06-23 DIAGNOSIS — Z95828 Presence of other vascular implants and grafts: Secondary | ICD-10-CM

## 2016-06-23 DIAGNOSIS — C7801 Secondary malignant neoplasm of right lung: Secondary | ICD-10-CM | POA: Insufficient documentation

## 2016-06-23 DIAGNOSIS — Z9221 Personal history of antineoplastic chemotherapy: Secondary | ICD-10-CM | POA: Insufficient documentation

## 2016-06-23 DIAGNOSIS — C099 Malignant neoplasm of tonsil, unspecified: Secondary | ICD-10-CM | POA: Insufficient documentation

## 2016-06-23 DIAGNOSIS — Z452 Encounter for adjustment and management of vascular access device: Secondary | ICD-10-CM | POA: Diagnosis not present

## 2016-06-23 DIAGNOSIS — Z923 Personal history of irradiation: Secondary | ICD-10-CM | POA: Insufficient documentation

## 2016-06-23 DIAGNOSIS — C7802 Secondary malignant neoplasm of left lung: Secondary | ICD-10-CM | POA: Insufficient documentation

## 2016-06-23 MED ORDER — SODIUM CHLORIDE 0.9% FLUSH
10.0000 mL | INTRAVENOUS | Status: DC | PRN
  Administered 2016-06-23: 10 mL via INTRAVENOUS
  Filled 2016-06-23: qty 10

## 2016-06-23 MED ORDER — HEPARIN SOD (PORK) LOCK FLUSH 100 UNIT/ML IV SOLN
500.0000 [IU] | Freq: Once | INTRAVENOUS | Status: AC
  Administered 2016-06-23: 500 [IU] via INTRAVENOUS
  Filled 2016-06-23: qty 5

## 2016-06-23 NOTE — Patient Instructions (Signed)
[place] at [facility] Discharge Instructions  RECOMMENDATIONS MADE BY THE CONSULTANT AND ANY TEST RESULTS WILL BE SENT TO YOUR REFERRING PHYSICIAN.  Port flush done today Follow up as scheduled.  Thank you for choosing [place] at [facility] to provide your oncology and hematology care.  To afford each patient quality time with our provider, please arrive at least 15 minutes before your scheduled appointment time.    If you have a lab appointment with the [place] please come in thru the  Main Entrance and check in at the main information desk  You need to re-schedule your appointment should you arrive 10 or more minutes late.  We strive to give you quality time with our providers, and arriving late affects you and other patients whose appointments are after yours.  Also, if you no show three or more times for appointments you may be dismissed from the clinic at the providers discretion.     Again, thank you for choosing [facility].  Our hope is that these requests will decrease the amount of time that you wait before being seen by our physicians.       _____________________________________________________________  Should you have questions after your visit to [facility], please contact our office at [phone] between the hours of 8:30 a.m. and 4:30 p.m.  Voicemails left after 4:30 p.m. will not be returned until the following business day.  For prescription refill requests, have your pharmacy contact our office.       Resources For Cancer Patients and their Caregivers ? American Cancer Society: Can assist with transportation, wigs, general needs, runs Look Good Feel Better.        570-799-3561 ? Cancer Care: Provides financial assistance, online support groups, medication/co-pay assistance.  1-800-813-HOPE 316-199-5735) ? [place] Assists [place] cancer patients and  their families through emotional , educational and financial support.  406-526-0671 ? [place] DSS Where to apply for food stamps, Medicaid and utility assistance. 941-683-9290 ? RCATS: Transportation to medical appointments. ([phone] ? Social Security Administration: May apply for disability if have a Stage IV cancer. 4134583203 [phone] ? [employer], Disability and Transit Services: Assists with nutrition, care and transit needs. [place] Support Programs: @10RELATIVEDAYS @ > Cancer Support Group  2nd Tuesday of the month 1pm-2pm, Journey Room  > Creative Journey  3rd Tuesday of the month 1130am-1pm, Journey Room  > Look Good Feel Better  1st Wednesday of the month 10am-12 noon, Journey Room (Call [place] to register [phone])

## 2016-06-23 NOTE — Progress Notes (Signed)
[name] presented for Portacath access and flush. Portacath located right chest wall accessed with  H 20 needle. Good blood return present. Portacath flushed with 27ml NS and 500U/53ml Heparin and needle removed intact. Procedure without incident. Patient tolerated procedure well.  Vitals stable and discharged home from clinic ambulatory. Follow up as scheduled.

## 2016-07-09 DIAGNOSIS — Z6828 Body mass index (BMI) 28.0-28.9, adult: Secondary | ICD-10-CM | POA: Diagnosis not present

## 2016-07-09 DIAGNOSIS — Z7689 Persons encountering health services in other specified circumstances: Secondary | ICD-10-CM | POA: Diagnosis not present

## 2016-07-09 DIAGNOSIS — Z79891 Long term (current) use of opiate analgesic: Secondary | ICD-10-CM | POA: Diagnosis not present

## 2016-07-09 DIAGNOSIS — G894 Chronic pain syndrome: Secondary | ICD-10-CM | POA: Diagnosis not present

## 2016-08-15 DIAGNOSIS — Z0000 Encounter for general adult medical examination without abnormal findings: Secondary | ICD-10-CM | POA: Diagnosis not present

## 2016-08-15 DIAGNOSIS — E782 Mixed hyperlipidemia: Secondary | ICD-10-CM | POA: Diagnosis not present

## 2016-08-15 DIAGNOSIS — Z1389 Encounter for screening for other disorder: Secondary | ICD-10-CM | POA: Diagnosis not present

## 2016-08-15 DIAGNOSIS — Z6831 Body mass index (BMI) 31.0-31.9, adult: Secondary | ICD-10-CM | POA: Diagnosis not present

## 2016-08-15 DIAGNOSIS — C799 Secondary malignant neoplasm of unspecified site: Secondary | ICD-10-CM | POA: Diagnosis not present

## 2016-08-21 ENCOUNTER — Encounter (HOSPITAL_COMMUNITY): Payer: Medicare Other | Attending: Oncology

## 2016-08-21 ENCOUNTER — Encounter (HOSPITAL_COMMUNITY): Payer: Self-pay

## 2016-08-21 VITALS — BP 127/85 | HR 88 | Temp 97.7°F | Resp 18

## 2016-08-21 DIAGNOSIS — C099 Malignant neoplasm of tonsil, unspecified: Secondary | ICD-10-CM | POA: Diagnosis not present

## 2016-08-21 DIAGNOSIS — Z452 Encounter for adjustment and management of vascular access device: Secondary | ICD-10-CM | POA: Diagnosis not present

## 2016-08-21 DIAGNOSIS — Z95828 Presence of other vascular implants and grafts: Secondary | ICD-10-CM

## 2016-08-21 MED ORDER — SODIUM CHLORIDE 0.9% FLUSH
10.0000 mL | INTRAVENOUS | Status: DC | PRN
  Administered 2016-08-21: 10 mL via INTRAVENOUS
  Filled 2016-08-21: qty 10

## 2016-08-21 MED ORDER — HEPARIN SOD (PORK) LOCK FLUSH 100 UNIT/ML IV SOLN
500.0000 [IU] | Freq: Once | INTRAVENOUS | Status: AC
  Administered 2016-08-21: 500 [IU] via INTRAVENOUS

## 2016-08-21 NOTE — Patient Instructions (Signed)
[place] at [facility] Discharge Instructions  RECOMMENDATIONS MADE BY THE CONSULTANT AND ANY TEST RESULTS WILL BE SENT TO YOUR REFERRING PHYSICIAN.  Portacath flushed today per protocol. Follow-up as scheduled. Call clinic for any questions or concerns  Thank you for choosing [place] at [facility] to provide your oncology and hematology care.  To afford each patient quality time with our provider, please arrive at least 15 minutes before your scheduled appointment time.    If you have a lab appointment with the [place] please come in thru the  Main Entrance and check in at the main information desk  You need to re-schedule your appointment should you arrive 10 or more minutes late.  We strive to give you quality time with our providers, and arriving late affects you and other patients whose appointments are after yours.  Also, if you no show three or more times for appointments you may be dismissed from the clinic at the providers discretion.     Again, thank you for choosing [facility].  Our hope is that these requests will decrease the amount of time that you wait before being seen by our physicians.       _____________________________________________________________  Should you have questions after your visit to [facility], please contact our office at [phone] between the hours of 8:30 a.m. and 4:30 p.m.  Voicemails left after 4:30 p.m. will not be returned until the following business day.  For prescription refill requests, have your pharmacy contact our office.       Resources For Cancer Patients and their Caregivers ? American Cancer Society: Can assist with transportation, wigs, general needs, runs Look Good Feel Better.        616-468-7205 ? Cancer Care: Provides financial assistance, online support groups, medication/co-pay assistance.  1-800-813-HOPE 870-704-2173) ? [place] Assists [place] cancer patients and their families through emotional , educational and financial support.  ([phone] ? [place] DSS Where to apply for food stamps, Medicaid and utility assistance. ([phone] 879-2262 ? RCATS: Transportation to medical appointments. 6820018509 ? Social Security Administration: May apply for disability if have a Stage IV cancer. [phone] 417-216-5765 ? [employer], Disability and Transit Services: Assists with nutrition, care and transit needs. [place] Support Programs: @10RELATIVEDAYS @ > Cancer Support Group  2nd Tuesday of the month 1pm-2pm, Journey Room  > Creative Journey  3rd Tuesday of the month 1130am-1pm, Journey Room  > Look Good Feel Better  1st Wednesday of the month 10am-12 noon, Journey Room (Call [place] to register [phone])

## 2016-08-21 NOTE — Progress Notes (Signed)
[name] tolerated port flush well without complaints or incident. Port accessed with 20 gauge needle with blood return noted then flushed with 10 ml NS and 5 ml Heparin easily per protocol then de-accessed. VSS Pt discharged self ambulatory in satisfactory condition

## 2016-10-29 DIAGNOSIS — F329 Major depressive disorder, single episode, unspecified: Secondary | ICD-10-CM | POA: Diagnosis not present

## 2016-10-29 DIAGNOSIS — G894 Chronic pain syndrome: Secondary | ICD-10-CM | POA: Diagnosis not present

## 2016-10-29 DIAGNOSIS — Z6830 Body mass index (BMI) 30.0-30.9, adult: Secondary | ICD-10-CM | POA: Diagnosis not present

## 2016-10-29 DIAGNOSIS — F419 Anxiety disorder, unspecified: Secondary | ICD-10-CM | POA: Diagnosis not present

## 2016-10-30 ENCOUNTER — Encounter (HOSPITAL_BASED_OUTPATIENT_CLINIC_OR_DEPARTMENT_OTHER): Payer: Medicare Other

## 2016-10-30 ENCOUNTER — Encounter (HOSPITAL_COMMUNITY): Payer: Self-pay

## 2016-10-30 ENCOUNTER — Encounter (HOSPITAL_COMMUNITY): Payer: Medicare Other | Attending: Oncology | Admitting: Oncology

## 2016-10-30 VITALS — BP 132/81 | HR 65 | Temp 98.3°F | Resp 20 | Wt 241.6 lb

## 2016-10-30 DIAGNOSIS — C7800 Secondary malignant neoplasm of unspecified lung: Secondary | ICD-10-CM | POA: Diagnosis not present

## 2016-10-30 DIAGNOSIS — C099 Malignant neoplasm of tonsil, unspecified: Secondary | ICD-10-CM | POA: Diagnosis not present

## 2016-10-30 LAB — CBC WITH DIFFERENTIAL/PLATELET
BASOS PCT: 2 %
Basophils Absolute: 0.1 10*3/uL (ref 0.0–0.1)
EOS ABS: 0.1 10*3/uL (ref 0.0–0.7)
EOS PCT: 3 %
HCT: 44.4 % (ref 39.0–52.0)
Hemoglobin: 14.7 g/dL (ref 13.0–17.0)
LYMPHS ABS: 0.8 10*3/uL (ref 0.7–4.0)
LYMPHS PCT: 17 %
MCH: 30.2 pg (ref 26.0–34.0)
MCHC: 33.1 g/dL (ref 30.0–36.0)
MCV: 91.4 fL (ref 78.0–100.0)
MONOS PCT: 7 %
Monocytes Absolute: 0.3 10*3/uL (ref 0.1–1.0)
Neutro Abs: 3.4 10*3/uL (ref 1.7–7.7)
Neutrophils Relative %: 71 %
PLATELETS: 166 10*3/uL (ref 150–400)
RBC: 4.86 MIL/uL (ref 4.22–5.81)
RDW: 15.3 % (ref 11.5–15.5)
WBC: 4.7 10*3/uL (ref 4.0–10.5)

## 2016-10-30 LAB — COMPREHENSIVE METABOLIC PANEL
ALBUMIN: 4.2 g/dL (ref 3.5–5.0)
ALT: 30 U/L (ref 17–63)
ANION GAP: 9 (ref 5–15)
AST: 41 U/L (ref 15–41)
Alkaline Phosphatase: 62 U/L (ref 38–126)
BUN: 11 mg/dL (ref 6–20)
CO2: 26 mmol/L (ref 22–32)
Calcium: 9.2 mg/dL (ref 8.9–10.3)
Chloride: 106 mmol/L (ref 101–111)
Creatinine, Ser: 0.87 mg/dL (ref 0.61–1.24)
GFR calc Af Amer: >60 mL/min (ref >60)
GFR calc non Af Amer: >60 mL/min (ref >60)
GLUCOSE: 84 mg/dL (ref 65–99)
POTASSIUM: 3.9 mmol/L (ref 3.5–5.1)
SODIUM: 141 mmol/L (ref 135–145)
TOTAL PROTEIN: 8 g/dL (ref 6.5–8.1)
Total Bilirubin: 0.8 mg/dL (ref 0.3–1.2)

## 2016-10-30 MED ORDER — SODIUM CHLORIDE 0.9% FLUSH
10.0000 mL | Freq: Once | INTRAVENOUS | Status: AC
  Administered 2016-10-30: 10 mL via INTRAVENOUS

## 2016-10-30 MED ORDER — HEPARIN SOD (PORK) LOCK FLUSH 100 UNIT/ML IV SOLN
500.0000 [IU] | Freq: Once | INTRAVENOUS | Status: AC
  Administered 2016-10-30: 500 [IU] via INTRAVENOUS
  Filled 2016-10-30: qty 5

## 2016-10-30 NOTE — Progress Notes (Signed)
[place] at [place] NOTE  Patient Care Team: Sharilyn Sites, MD as PCP - General (Family Medicine) Leota Sauers, RN as Registered Nurse (Oncology) Heath Lark, MD as Consulting Physician (Hematology and Oncology) Eppie Gibson, MD as Attending Physician (Radiation Oncology) Patrici Ranks, MD (Inactive) as Consulting Physician (Internal Medicine)  Diagnosis: Squamous Cell Carcinoma of the right tonsil, p16+, T3N2cM0, Stage IVA  Right tonsil and bilateral neck / 70 Gy in 35 fractions to gross disease, 63 Gy in 35 fractions to high risk nodal echelons, and 56 Gy in 35 fractions to intermediate risk nodal echelons 05/02/2013-06/22/2013   Oncology History   Tonsil cancer, HPV positive   Primary site: Pharynx - Oropharynx (Right)   Staging method: AJCC 7th Edition   Clinical free text: HPV positive   Clinical: Stage IVA (T3, N2c, M0) signed by Heath Lark, MD on 04/20/2013  9:29 PM   Summary: Stage IVA (T3, N2c, M0)       Tonsil cancer ([place])   02/01/2013 Imaging    Ultrasound of the neck revealed bilateral lymphadenopathy in the submandibular region      03/16/2013 Imaging    CT scan of the neck show large right tonsil mass measured 3.9 cm in maximum dimension as well as bilateral lymphadenopathy, the largest lymph node measures 31 mm. There is also a left thyroid mass measured 44 mm      03/21/2013 Procedure    The patient was seen by ENT with laryngoscopy and biopsy. Pathology is pending      03/25/2013 Imaging    PET scan showed large hypermetabolic soft tissue mass in the region of the right palatine tonsil with bilateral cervical hypermetabolic lymphadenopathy indicative of metastatic disease,      04/04/2013 Surgery    He underwent placement of Port-A-Cath and feeding tube      04/14/2013 Procedure    The patient underwent teeth extraction.      05/02/2013 - 06/13/2013 Chemotherapy    The dosage of chemotherapy was interrupted many times due  to side effects. His treatment is terminated early due to severe ulcerated skin toxicity.      05/02/2013 - 06/22/2013 Radiation Therapy    Right tonsil and bilateral neck / 70 Gy in 35 fractions to gross disease, 63 Gy in 35 fractions to high risk nodal echelons, and 56 Gy in 35 fractions to intermediate risk nodal echelons 05/02/2013-06/22/2013       10/18/2013 Imaging    PET CT scan showed complete response in the oropharynx. However, he has evidence of new bilateral pulmonary metastasis.      12/16/2013 Imaging    Repeat PET CT scan show significant progression of pulmonary metastases       01/25/2014 Pathology Results    Squamous Cell Carcinoma, CT guided biopsy of RUL nodule      02/08/2014 - 04/20/2014 Chemotherapy    Cisplatin/Taxol weekly x 8      04/17/2014 Imaging    PET Interval improvement in size and hypermetabolism of the patient's multiple bilateral pulmonary metastases. No evidence for abnormal FDG uptake in the tongue base on the current study.Stable enlargement of the left thyroid lobe.       05/04/2014 - 05/11/2014 Chemotherapy    Xeloda 2000 mg in AM and 1500 mg in PM 7 days on and 7 days off      05/11/2014 Adverse Reaction    Decreased appetite and increased weakness.      12/14/2014 Imaging  possibility of aspiration pneumonitis.Stable subcentimeter non-hypermetabolic pulmonary nodules. No new pulmonary nodules.No metabolic evidence of local tumor recurrence in the pharynx.No metabolic evidence of recurrent nodal metastases       04/04/2015 PET scan    mild pathcy/nodular opacity in medial LLL, new. infection remains favored diagnosis. tumor not excluded. increased interstitial markings with superimposed tree-in-bud nodularity/ground glass opacity in RML and BLL, chronic aspiration      04/25/2015 Imaging    Modified barium swallow, post XRT appearance of epiglottis with thickened blunted appearance. epiglottic turnover poor, large and silent or weakly responvie  aspiration       05/22/2015 Imaging    CT chest, marked improvement in bibasilar airspace nodular opacities c/w resolving pulm infection, sub cm nodules unchanged      07/25/2015 PET scan    No evidence of recurrent or metastatic disease, mild scarring with faint ground glass opacity in the bilateral lower lobes likely sequela of prior/chronic aspiration      01/23/2016 PET scan    1. Stable exam. No evidence for residual/recurrent hypermetabolic mass or adenopathy. 2. Stable biapical pleuroparenchymal scarring and 7 mm right middle lobe lung nodule. 3. Aortic atherosclerosis and coronary artery calcifications.      HISTORY OF PRESENTING ILLNESS:  [name] 66 y.o. 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 He is doing well today, he has no complaints. Denies dysphagia, chest pain, SOB, abdominal pain, weight loss, loss of appetite, or any other concerns.   MEDICAL HISTORY:  Past Medical History:  Diagnosis Date  . Allergy   . Anxiety   . Arthritis    knees, HIps, Hands  . Chest pain    10/14  . Complication of anesthesia    bleeding during intubation 04/04/13 due to friability of right tonsillar cancer  . Constipation   . Depression   . Fibromyalgia   . Neuropathy    compression neuropathy right hip;s/p replacement  . PEG (percutaneous endoscopic gastrostomy) status ([place])   . Pneumonia 2012  . 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 Tonsillar cancer ([place])   . Type II diabetes mellitus ([place])   . Urethral stricture    s/p dilitation    SURGICAL HISTORY: Past Surgical History:  Procedure Laterality Date  . CYSTOSCOPY     Dr. Karsten Ro  . GASTROSTOMY TUBE PLACEMENT  04/04/2013  . LAPAROSCOPIC GASTROSTOMY N/A 04/04/2013   Procedure: LAPAROSCOPIC GASTROSTOMY TUBE PLACEMENT ;  Surgeon: Ralene Ok, MD;  Location: [facility];  Service: General;  Laterality: N/A;  .  MULTIPLE EXTRACTIONS WITH ALVEOLOPLASTY N/A 04/14/2013   Procedure: Extraction of tooth #'s 1,2,3,4,5,6,7,8,9,10,11,12,13,14,15,17,18,19,20,21,22,23,24,25,26,27,28,29, 30, 31, and 32 with alveoloplasty and bilateral mandibular tori reductions.;  Surgeon: Lenn Cal, DDS;  Location: [facility];  Service: Oral Surgery;  Laterality: N/A;  . NASAL HEMORRHAGE CONTROL N/A 04/04/2013   Procedure: Control of oropharyngeal hemorrhage;  Surgeon: Ascencion Dike, MD;  Location: [place] [place] [facility];  Service: ENT;  Laterality: N/A;  . PORTACATH PLACEMENT Right 04/04/2013  . PORTACATH PLACEMENT N/A 04/04/2013   Procedure: INSERTION PORT-A-CATH;  Surgeon: Ralene Ok, MD;  Location: [place];  Service: General;  Laterality: N/A;  . TOTAL HIP ARTHROPLASTY Right 2000   Dr. Percell Miller    SOCIAL HISTORY: Social History   Social History  . Marital status: Married    Spouse name: N/A  . Number of children: N/A  .  Years of education: N/A   Occupational History  . Not on file.   Social History Main Topics  . Smoking status: Never Smoker  . Smokeless tobacco: Never Used  . Alcohol use No     Comment: Occasional beer  . Drug use: No  . Sexual activity: Yes     Comment: married   Other Topics Concern  . Not on file   Social History Narrative  . No narrative on file  Married for 20 years. They have no children. They have a dog. He has a daughter from a prior relationship, they are estranged. No smoking, worked at [employer]. He has never smoked. Occasional alcohol.  FAMILY HISTORY: Family History  Problem Relation Age of Onset  . Cancer Cousin        living brain cancer, male  . Cancer Mother        Deceased with leukemia, had uterine ca  . Hyperlipidemia Brother    indicated that his mother is deceased. He indicated that his father is deceased. He indicated that the status of his brother is unknown. He indicated that his cousin is alive.    His mother had cervical cancer and leukemia. 1 brother who is  healthy.  ALLERGIES:  is allergic to neurontin [gabapentin] and dilaudid [hydromorphone hcl].  MEDICATIONS:  Current Outpatient Prescriptions  Medication Sig Dispense Refill  . acetaminophen (TYLENOL) 500 MG tablet Take 500-1,000 mg by mouth every 4 (four) hours as needed for mild pain. Reported on 12/27/2014    . alprazolam (XANAX) 2 MG tablet Take 2 mg by mouth 3 (three) times daily as needed for sleep or anxiety.     . carbamazepine (TEGRETOL) 200 MG tablet   10  . diphenhydrAMINE (BENADRYL) 25 mg capsule Take 1 capsule (25 mg total) by mouth every 6 (six) hours as needed for itching. 60 capsule 0  . LYRICA 300 MG capsule   4  . oxyCODONE-acetaminophen (PERCOCET) 10-325 MG tablet   0   No current facility-administered medications for this visit.    Review of Systems  Constitutional: Negative.  Negative for weight loss.       No loss of appetite  HENT: Negative.        No dysphagia    Eyes: Negative.   Respiratory: Negative.  Negative for shortness of breath.   Cardiovascular: Negative.  Negative for chest pain.  Gastrointestinal: Negative.  Negative for abdominal pain.  Genitourinary: Negative.   Musculoskeletal: Negative.   Skin: Negative.   Neurological: Negative.   Endo/Heme/Allergies: Negative.   Psychiatric/Behavioral: Negative.   All other systems reviewed and are negative. 14 point review of systems was performed and is negative except as detailed under history of present illness and above  PHYSICAL EXAMINATION: ECOG PERFORMANCE STATUS: 0 - Asymptomatic  Vitals:   10/30/16 1430  BP: 132/81  Pulse: 65  Resp: 20  Temp: 98.3 F (36.8 C)  SpO2: 98%     Physical Exam  Constitutional: He is oriented to person, place, and time and well-developed, well-nourished, and in no distress.  HENT:  Head: Normocephalic and atraumatic.  Mouth/Throat: Oropharynx is clear and moist.  Eyes: Pupils are equal, round, and reactive to light. Conjunctivae and EOM are normal. No  scleral icterus.  Neck: Normal range of motion. Neck supple.  Cardiovascular: Normal rate, regular rhythm and normal heart sounds.   Pulmonary/Chest: Effort normal. No respiratory distress.  Abdominal: Soft. Bowel sounds are normal. He exhibits no distension and no mass. There  is no tenderness. There is no rebound and no guarding.  Musculoskeletal: Normal range of motion.  Lymphadenopathy:    He has no cervical adenopathy.  Neurological: He is alert and oriented to person, place, and time. Gait normal.  Skin: Skin is warm and dry.  Psychiatric: Mood, memory, affect and judgment normal.  Nursing note and vitals reviewed.  LABORATORY DATA:  I have reviewed the data as listed CBC Latest Ref Rng & Units 01/25/2016 11/26/2015 07/26/2015  WBC 4.0 - 10.5 K/uL 4.6 4.2 4.3  Hemoglobin 13.0 - 17.0 g/dL 14.0 13.7 13.3  Hematocrit 39.0 - 52.0 % 42.3 42.6 40.5  Platelets 150 - 400 K/uL 159 134(L) 142(L)   CMP Latest Ref Rng & Units 01/25/2016 11/26/2015 07/26/2015  Glucose 65 - 99 mg/dL 97 82 87  BUN 6 - 20 mg/dL 15 14 15   Creatinine 0.61 - 1.24 mg/dL 0.83 0.82 0.78  Sodium 135 - 145 mmol/L 136 138 139  Potassium 3.5 - 5.1 mmol/L 3.9 3.9 3.7  Chloride 101 - 111 mmol/L 101 104 104  CO2 22 - 32 mmol/L 28 27 29   Calcium 8.9 - 10.3 mg/dL 9.0 8.8(L) 9.0  Total Protein 6.5 - 8.1 g/dL 7.8 7.6 7.6  Total Bilirubin 0.3 - 1.2 mg/dL 0.6 0.7 0.9  Alkaline Phos 38 - 126 U/L 58 63 74  AST 15 - 41 U/L 34 37 19  ALT 17 - 63 U/L 28 27 11(L)   RADIOLOGY: I have personally reviewed the radiological images as listed and agreed with the findings in the report.  PET Scan 01/23/2016 IMPRESSION: 1. Stable exam. No evidence for residual/recurrent hypermetabolic mass or adenopathy. 2. Stable biapical pleuroparenchymal scarring and 7 mm right middle lobe lung nodule. 3. Aortic atherosclerosis and coronary artery calcifications.  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  He completed therapy with Cisplatin and Taxol for his stage IV disease given weekly. He had an excellent response. His performance status is a 0-1.   Clinicallly NED on exam today.  Port flush today and q2 months.  He will return for follow up in 6 months with labs.   All questions were answered. The patient knows to call the clinic with any problems, questions or concerns.  Twana First, MD

## 2016-10-30 NOTE — Progress Notes (Signed)
Port flushed per protocol and with no complaints voiced.  Port site clean and dry with no bruising or swelling noted at site.  Band aid applied.  VSS and left ambulatory with family.

## 2016-10-30 NOTE — Patient Instructions (Signed)
[facility] at [facility]  Discharge Instructions:  Your port was flushed today. _______________________________________________________________  Thank you for choosing [facility] at [facility] to provide your oncology and hematology care.  To afford each patient quality time with our providers, please arrive at least 15 minutes before your scheduled appointment.  You need to re-schedule your appointment if you arrive 10 or more minutes late.  We strive to give you quality time with our providers, and arriving late affects you and other patients whose appointments are after yours.  Also, if you no show three or more times for appointments you may be dismissed from the clinic.  Again, thank you for choosing [facility] at [facility]. Our hope is that these requests will allow you access to exceptional care and in a timely manner. _______________________________________________________________  If you have questions after your visit, please contact our office at [phone] between the hours of 8:30 a.m. and 5:00 p.m. Voicemails left after 4:30 p.m. will not be returned until the following business day. _______________________________________________________________  For prescription refill requests, have your pharmacy contact our office. _______________________________________________________________  Recommendations made by the consultant and any test results will be sent to your referring physician. _______________________________________________________________ 

## 2016-10-30 NOTE — Patient Instructions (Addendum)
[place] at [facility] Discharge Instructions  RECOMMENDATIONS MADE BY THE CONSULTANT AND ANY TEST RESULTS WILL BE SENT TO YOUR REFERRING PHYSICIAN.  You were seen today by Dr. Twana First Continue to get your port flushed every 8 weeks Follow up in 6 months   Thank you for choosing [place] at [facility] to provide your oncology and hematology care.  To afford each patient quality time with our provider, please arrive at least 15 minutes before your scheduled appointment time.    If you have a lab appointment with the [place] please come in thru the  Main Entrance and check in at the main information desk  You need to re-schedule your appointment should you arrive 10 or more minutes late.  We strive to give you quality time with our providers, and arriving late affects you and other patients whose appointments are after yours.  Also, if you no show three or more times for appointments you may be dismissed from the clinic at the providers discretion.     Again, thank you for choosing [facility].  Our hope is that these requests will decrease the amount of time that you wait before being seen by our physicians.       _____________________________________________________________  Should you have questions after your visit to [facility], please contact our office at [phone] between the hours of 8:30 a.m. and 4:30 p.m.  Voicemails left after 4:30 p.m. will not be returned until the following business day.  For prescription refill requests, have your pharmacy contact our office.       Resources For Cancer Patients and their Caregivers ? American Cancer Society: Can assist with transportation, wigs, general needs, runs Look Good Feel Better.        [phone] ? Cancer Care: Provides financial assistance, online support groups, medication/co-pay assistance.  1-800-813-HOPE 5038702109) ? [place] Assists [place] cancer patients and their families through emotional , educational and financial support.  [phone] ? [place] DSS Where to apply for food stamps, Medicaid and utility assistance. ([phone] ? [employer]: Transportation to medical appointments. 470-505-5951 ? Social Security Administration: May apply for disability if have a Stage IV cancer. 502-774-3833 548-270-2778 ? [employer], Disability and Transit Services: Assists with nutrition, care and transit needs. [place] Support Programs: @10RELATIVEDAYS @ > Cancer Support Group  2nd Tuesday of the month 1pm-2pm, Journey Room  > Creative Journey  3rd Tuesday of the month 1130am-1pm, Journey Room  > Look Good Feel Better  1st Wednesday of the month 10am-12 noon, Journey Room (Call [place] to register [phone])

## 2016-12-18 DIAGNOSIS — G894 Chronic pain syndrome: Secondary | ICD-10-CM | POA: Diagnosis not present

## 2016-12-18 DIAGNOSIS — E663 Overweight: Secondary | ICD-10-CM | POA: Diagnosis not present

## 2016-12-18 DIAGNOSIS — G64 Other disorders of peripheral nervous system: Secondary | ICD-10-CM | POA: Diagnosis not present

## 2016-12-18 DIAGNOSIS — Z6828 Body mass index (BMI) 28.0-28.9, adult: Secondary | ICD-10-CM | POA: Diagnosis not present

## 2016-12-31 ENCOUNTER — Encounter (HOSPITAL_COMMUNITY): Payer: Self-pay

## 2017-01-02 ENCOUNTER — Encounter (HOSPITAL_COMMUNITY): Payer: Self-pay

## 2017-01-09 ENCOUNTER — Encounter (HOSPITAL_COMMUNITY): Payer: Medicare Other | Attending: Oncology

## 2017-01-09 ENCOUNTER — Encounter (HOSPITAL_COMMUNITY): Payer: Self-pay

## 2017-01-09 ENCOUNTER — Other Ambulatory Visit: Payer: Self-pay

## 2017-01-09 VITALS — BP 116/90 | HR 76 | Temp 98.2°F | Resp 18

## 2017-01-09 DIAGNOSIS — Z95828 Presence of other vascular implants and grafts: Secondary | ICD-10-CM

## 2017-01-09 DIAGNOSIS — Z452 Encounter for adjustment and management of vascular access device: Secondary | ICD-10-CM

## 2017-01-09 DIAGNOSIS — C099 Malignant neoplasm of tonsil, unspecified: Secondary | ICD-10-CM

## 2017-01-09 DIAGNOSIS — C7800 Secondary malignant neoplasm of unspecified lung: Secondary | ICD-10-CM | POA: Insufficient documentation

## 2017-01-09 MED ORDER — SODIUM CHLORIDE 0.9% FLUSH
10.0000 mL | INTRAVENOUS | Status: DC | PRN
  Administered 2017-01-09: 10 mL via INTRAVENOUS
  Filled 2017-01-09: qty 10

## 2017-01-09 MED ORDER — HEPARIN SOD (PORK) LOCK FLUSH 100 UNIT/ML IV SOLN
500.0000 [IU] | Freq: Once | INTRAVENOUS | Status: AC
  Administered 2017-01-09: 500 [IU] via INTRAVENOUS
  Filled 2017-01-09: qty 5

## 2017-01-09 NOTE — Patient Instructions (Signed)
[place] at [facility] Discharge Instructions  RECOMMENDATIONS MADE BY THE CONSULTANT AND ANY TEST RESULTS WILL BE SENT TO YOUR REFERRING PHYSICIAN.  You had your port flushed today, continue to get it flushed every 6-8 weeks.  Thank you for choosing [place] at [facility] to provide your oncology and hematology care.  To afford each patient quality time with our provider, please arrive at least 15 minutes before your scheduled appointment time.    If you have a lab appointment with the [place] please come in thru the  Main Entrance and check in at the main information desk  You need to re-schedule your appointment should you arrive 10 or more minutes late.  We strive to give you quality time with our providers, and arriving late affects you and other patients whose appointments are after yours.  Also, if you no show three or more times for appointments you may be dismissed from the clinic at the providers discretion.     Again, thank you for choosing [facility].  Our hope is that these requests will decrease the amount of time that you wait before being seen by our physicians.       _____________________________________________________________  Should you have questions after your visit to [facility], please contact our office at [phone] between the hours of 8:30 a.m. and 4:30 p.m.  Voicemails left after 4:30 p.m. will not be returned until the following business day.  For prescription refill requests, have your pharmacy contact our office.       Resources For Cancer Patients and their Caregivers ? American Cancer Society: Can assist with transportation, wigs, general needs, runs Look Good Feel Better.        660-842-0744 ? Cancer Care: Provides financial assistance, online support groups, medication/co-pay assistance.  1-800-813-HOPE 570-344-6866) ? [place] Assists  [place] cancer patients and their families through emotional , educational and financial support.  (401) 215-4599 ? [place] DSS Where to apply for food stamps, Medicaid and utility assistance. 906-330-0385 ? RCATS: Transportation to medical appointments. 513-430-0986 ? Social Security Administration: May apply for disability if have a Stage IV cancer. 619-476-5486 (347)004-7450 ? [employer], Disability and Transit Services: Assists with nutrition, care and transit needs. [place] Support Programs: @10RELATIVEDAYS @ > Cancer Support Group  2nd Tuesday of the month 1pm-2pm, Journey Room  > Creative Journey  3rd Tuesday of the month 1130am-1pm, Journey Room  > Look Good Feel Better  1st Wednesday of the month 10am-12 noon, Journey Room (Call [place] to register [phone])

## 2017-01-09 NOTE — Progress Notes (Signed)
[name] presented for Portacath access and flush. Portacath located right chest wall accessed with  H 20 needle. Good blood return present. Portacath flushed with 39ml NS and 500U/81ml Heparin and needle removed intact. Procedure without incident. Patient tolerated procedure well. Patient discharged ambulatory and in stable condition from clinic. Follow up as scheduled.

## 2017-02-18 DIAGNOSIS — Z6829 Body mass index (BMI) 29.0-29.9, adult: Secondary | ICD-10-CM | POA: Diagnosis not present

## 2017-02-18 DIAGNOSIS — F329 Major depressive disorder, single episode, unspecified: Secondary | ICD-10-CM | POA: Diagnosis not present

## 2017-02-18 DIAGNOSIS — F419 Anxiety disorder, unspecified: Secondary | ICD-10-CM | POA: Diagnosis not present

## 2017-02-18 DIAGNOSIS — G894 Chronic pain syndrome: Secondary | ICD-10-CM | POA: Diagnosis not present

## 2017-03-03 ENCOUNTER — Encounter (HOSPITAL_COMMUNITY): Payer: Self-pay

## 2017-03-03 ENCOUNTER — Inpatient Hospital Stay (HOSPITAL_COMMUNITY): Payer: Medicare Other | Attending: Oncology

## 2017-03-03 ENCOUNTER — Other Ambulatory Visit: Payer: Self-pay

## 2017-03-03 VITALS — BP 116/66 | HR 55 | Temp 98.1°F | Resp 20

## 2017-03-03 DIAGNOSIS — C099 Malignant neoplasm of tonsil, unspecified: Secondary | ICD-10-CM | POA: Diagnosis not present

## 2017-03-03 DIAGNOSIS — Z452 Encounter for adjustment and management of vascular access device: Secondary | ICD-10-CM | POA: Diagnosis not present

## 2017-03-03 DIAGNOSIS — Z95828 Presence of other vascular implants and grafts: Secondary | ICD-10-CM

## 2017-03-03 MED ORDER — SODIUM CHLORIDE 0.9% FLUSH
10.0000 mL | INTRAVENOUS | Status: DC | PRN
  Administered 2017-03-03: 10 mL via INTRAVENOUS
  Filled 2017-03-03: qty 10

## 2017-03-03 MED ORDER — HEPARIN SOD (PORK) LOCK FLUSH 100 UNIT/ML IV SOLN
500.0000 [IU] | Freq: Once | INTRAVENOUS | Status: AC
  Administered 2017-03-03: 500 [IU] via INTRAVENOUS

## 2017-03-03 NOTE — Progress Notes (Signed)
[name] presented for Portacath access and flush. Portacath located right chest wall accessed with  H 20 needle. Good blood return present. Portacath flushed with 24ml NS and 500U/24ml Heparin and needle removed intact. Procedure without incident. Patient tolerated procedure well and was discharged ambulatory and in stable condition. Patient to follow up as scheduled.

## 2017-03-03 NOTE — Patient Instructions (Signed)
[place] at [facility] Discharge Instructions  RECOMMENDATIONS MADE BY THE CONSULTANT AND ANY TEST RESULTS WILL BE SENT TO YOUR REFERRING PHYSICIAN.  You had your port flushed today. Follow up as scheduled.  Thank you for choosing [place] [employer] at [facility] to provide your oncology and hematology care.  To afford each patient quality time with our provider, please arrive at least 15 minutes before your scheduled appointment time.    If you have a lab appointment with the [place] please come in thru the  Main Entrance and check in at the main information desk  You need to re-schedule your appointment should you arrive 10 or more minutes late.  We strive to give you quality time with our providers, and arriving late affects you and other patients whose appointments are after yours.  Also, if you no show three or more times for appointments you may be dismissed from the clinic at the providers discretion.     Again, thank you for choosing [facility].  Our hope is that these requests will decrease the amount of time that you wait before being seen by our physicians.       _____________________________________________________________  Should you have questions after your visit to [facility], please contact our office at [phone] between the hours of 8:30 a.m. and 4:30 p.m.  Voicemails left after 4:30 p.m. will not be returned until the following business day.  For prescription refill requests, have your pharmacy contact our office.       Resources For Cancer Patients and their Caregivers ? American Cancer Society: Can assist with transportation, wigs, general needs, runs Look Good Feel Better.        ([phone] ? Cancer Care: Provides financial assistance, online support groups, medication/co-pay assistance.  1-800-813-HOPE 773-473-0553) ? [place] Assists [place] cancer  patients and their families through emotional , educational and financial support.  (978) 464-4141 ? [place] DSS Where to apply for food stamps, Medicaid and utility assistance. 423-246-8306 ? RCATS: Transportation to medical appointments. 747-831-8387 ? Social Security Administration: May apply for disability if have a Stage IV cancer. 301-424-9412 949-606-5274 ? [employer], Disability and Transit Services: Assists with nutrition, care and transit needs. [place] Support Programs: @10RELATIVEDAYS @ > Cancer Support Group  2nd Tuesday of the month 1pm-2pm, Journey Room  > Creative Journey  3rd Tuesday of the month 1130am-1pm, Journey Room  > Look Good Feel Better  1st Wednesday of the month 10am-12 noon, Journey Room (Call [place] to register [phone])

## 2017-04-30 ENCOUNTER — Inpatient Hospital Stay (HOSPITAL_COMMUNITY): Payer: Medicare Other | Attending: Oncology | Admitting: Hematology

## 2017-04-30 ENCOUNTER — Other Ambulatory Visit (HOSPITAL_COMMUNITY): Payer: Self-pay

## 2017-05-20 ENCOUNTER — Inpatient Hospital Stay (HOSPITAL_COMMUNITY): Payer: Medicare Other | Admitting: Internal Medicine

## 2017-05-20 ENCOUNTER — Inpatient Hospital Stay (HOSPITAL_COMMUNITY): Payer: Medicare Other | Attending: Oncology

## 2017-05-20 ENCOUNTER — Other Ambulatory Visit: Payer: Self-pay

## 2017-05-20 ENCOUNTER — Encounter (HOSPITAL_COMMUNITY): Payer: Self-pay | Admitting: Internal Medicine

## 2017-05-20 VITALS — BP 113/77 | HR 60 | Temp 97.9°F | Resp 16 | Wt 216.0 lb

## 2017-05-20 DIAGNOSIS — Z808 Family history of malignant neoplasm of other organs or systems: Secondary | ICD-10-CM | POA: Insufficient documentation

## 2017-05-20 DIAGNOSIS — Z79899 Other long term (current) drug therapy: Secondary | ICD-10-CM

## 2017-05-20 DIAGNOSIS — C7802 Secondary malignant neoplasm of left lung: Secondary | ICD-10-CM | POA: Insufficient documentation

## 2017-05-20 DIAGNOSIS — C099 Malignant neoplasm of tonsil, unspecified: Secondary | ICD-10-CM | POA: Insufficient documentation

## 2017-05-20 LAB — CBC WITH DIFFERENTIAL/PLATELET
Basophils Absolute: 0.1 10*3/uL (ref 0.0–0.1)
Basophils Relative: 1 %
EOS PCT: 5 %
Eosinophils Absolute: 0.2 10*3/uL (ref 0.0–0.7)
HEMATOCRIT: 42.3 % (ref 39.0–52.0)
Hemoglobin: 14.2 g/dL (ref 13.0–17.0)
LYMPHS ABS: 0.6 10*3/uL — AB (ref 0.7–4.0)
LYMPHS PCT: 14 %
MCH: 31.8 pg (ref 26.0–34.0)
MCHC: 33.6 g/dL (ref 30.0–36.0)
MCV: 94.6 fL (ref 78.0–100.0)
MONO ABS: 0.4 10*3/uL (ref 0.1–1.0)
Monocytes Relative: 8 %
NEUTROS ABS: 3.3 10*3/uL (ref 1.7–7.7)
Neutrophils Relative %: 72 %
PLATELETS: 159 10*3/uL (ref 150–400)
RBC: 4.47 MIL/uL (ref 4.22–5.81)
RDW: 14.1 % (ref 11.5–15.5)
WBC: 4.5 10*3/uL (ref 4.0–10.5)

## 2017-05-20 LAB — COMPREHENSIVE METABOLIC PANEL
ALBUMIN: 4.2 g/dL (ref 3.5–5.0)
ALK PHOS: 54 U/L (ref 38–126)
ALT: 18 U/L (ref 17–63)
AST: 23 U/L (ref 15–41)
Anion gap: 9 (ref 5–15)
BUN: 13 mg/dL (ref 6–20)
CHLORIDE: 102 mmol/L (ref 101–111)
CO2: 28 mmol/L (ref 22–32)
CREATININE: 0.72 mg/dL (ref 0.61–1.24)
Calcium: 9.3 mg/dL (ref 8.9–10.3)
GFR calc non Af Amer: >60 mL/min (ref >60)
Glucose, Bld: 96 mg/dL (ref 65–99)
Potassium: 4 mmol/L (ref 3.5–5.1)
Sodium: 139 mmol/L (ref 135–145)
Total Bilirubin: 0.9 mg/dL (ref 0.3–1.2)
Total Protein: 7.9 g/dL (ref 6.5–8.1)

## 2017-05-20 LAB — LACTATE DEHYDROGENASE: LDH: 101 U/L (ref 98–192)

## 2017-05-20 MED ORDER — SODIUM CHLORIDE 0.9% FLUSH
10.0000 mL | INTRAVENOUS | Status: DC | PRN
  Administered 2017-05-20: 10 mL via INTRAVENOUS
  Filled 2017-05-20: qty 10

## 2017-05-20 MED ORDER — HEPARIN SOD (PORK) LOCK FLUSH 100 UNIT/ML IV SOLN
500.0000 [IU] | Freq: Once | INTRAVENOUS | Status: AC
  Administered 2017-05-20: 500 [IU] via INTRAVENOUS

## 2017-05-20 NOTE — Progress Notes (Signed)
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 Pt was previously followed by Dr. Talbert Cage.   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 He was originally diagnosed in 2015.  Pt also had right pulmonary nodule biopsied 01/2014 with pathology returning as SCC.   He completed therapy with Cisplatin and Taxol for his stage IV disease given weekly. He had an excellent response. His performance status is a 0-1.     Pet was done 01/23/2016 and showed  IMPRESSION: 1. Stable exam. No evidence for residual/recurrent hypermetabolic mass or adenopathy. 2. Stable biapical pleuroparenchymal scarring and 7 mm right middle lobe lung nodule. 3. Aortic atherosclerosis and coronary artery calcifications.  Labs WNL today, with WBC 4.5 HB 14.2 plts 159,000.  LDH 101, Cr 0.72.  He will RTC in 6 months for follow-up and repeat labs.    2.  Pulmonary nodule.  Pt last PET scan was done 01/2016 and showed scarring and stable 7 mm right middle lobe lung nodule.  He is a non-smoker. Pt was  Not planned for additional imaging.  Will consider repeat imaging on RTC in 11/2017 for interval evaluation due to pulmonary nodules.    3.  DM.  Follow-up with PCP.    4.  Health maintenance.  Continue GI screenings as recommended.     Current Status:  Pt is seen today for follow-up.  He is here for port flush and to go over labs.  He denies complaints.     Oncology History   Tonsil  cancer, HPV positive   Primary site: Pharynx - Oropharynx (Right)   Staging method: AJCC 7th Edition   Clinical free text: HPV positive   Clinical: Stage IVA (T3, N2c, M0) signed by Heath Lark, MD on 04/20/2013  9:29 PM   Summary: Stage IVA (T3, N2c, M0)       Tonsil cancer ([place])   02/01/2013 Imaging    Ultrasound of the neck revealed bilateral lymphadenopathy in the submandibular region      03/16/2013 Imaging    CT scan of the neck show large right tonsil mass measured 3.9 cm in maximum dimension as well as bilateral lymphadenopathy, the largest lymph node measures 31 mm. There is also a left thyroid mass measured 44 mm      03/21/2013 Procedure    The patient was seen by ENT with laryngoscopy and biopsy. Pathology is pending      03/25/2013 Imaging    PET scan showed large hypermetabolic soft tissue mass in the region of the right palatine tonsil with bilateral cervical hypermetabolic lymphadenopathy indicative of metastatic disease,      04/04/2013 Surgery    He underwent placement of Port-A-Cath and feeding tube      04/14/2013 Procedure    The patient underwent teeth extraction.      05/02/2013 - 06/13/2013 Chemotherapy    The dosage of chemotherapy was interrupted many times due to side effects. His treatment is terminated early due to severe ulcerated skin toxicity.  05/02/2013 - 06/22/2013 Radiation Therapy    Right tonsil and bilateral neck / 70 Gy in 35 fractions to gross disease, 63 Gy in 35 fractions to high risk nodal echelons, and 56 Gy in 35 fractions to intermediate risk nodal echelons 05/02/2013-06/22/2013       10/18/2013 Imaging    PET CT scan showed complete response in the oropharynx. However, he has evidence of new bilateral pulmonary metastasis.      12/16/2013 Imaging    Repeat PET CT scan show significant progression of pulmonary metastases       01/25/2014 Pathology Results    Squamous Cell Carcinoma, CT guided biopsy of RUL nodule      02/08/2014 -  04/20/2014 Chemotherapy    Cisplatin/Taxol weekly x 8      04/17/2014 Imaging    PET Interval improvement in size and hypermetabolism of the patient's multiple bilateral pulmonary metastases. No evidence for abnormal FDG uptake in the tongue base on the current study.Stable enlargement of the left thyroid lobe.       05/04/2014 - 05/11/2014 Chemotherapy    Xeloda 2000 mg in AM and 1500 mg in PM 7 days on and 7 days off      05/11/2014 Adverse Reaction    Decreased appetite and increased weakness.      12/14/2014 Imaging    possibility of aspiration pneumonitis.Stable subcentimeter non-hypermetabolic pulmonary nodules. No new pulmonary nodules.No metabolic evidence of local tumor recurrence in the pharynx.No metabolic evidence of recurrent nodal metastases       04/04/2015 PET scan    mild pathcy/nodular opacity in medial LLL, new. infection remains favored diagnosis. tumor not excluded. increased interstitial markings with superimposed tree-in-bud nodularity/ground glass opacity in RML and BLL, chronic aspiration      04/25/2015 Imaging    Modified barium swallow, post XRT appearance of epiglottis with thickened blunted appearance. epiglottic turnover poor, large and silent or weakly responvie aspiration       05/22/2015 Imaging    CT chest, marked improvement in bibasilar airspace nodular opacities c/w resolving pulm infection, sub cm nodules unchanged      07/25/2015 PET scan    No evidence of recurrent or metastatic disease, mild scarring with faint ground glass opacity in the bilateral lower lobes likely sequela of prior/chronic aspiration      01/23/2016 PET scan    1. Stable exam. No evidence for residual/recurrent hypermetabolic mass or adenopathy. 2. Stable biapical pleuroparenchymal scarring and 7 mm right middle lobe lung nodule. 3. Aortic atherosclerosis and coronary artery calcifications.        Problem List Patient Active Problem List   Diagnosis Date Noted  .  Metastasis to lung () [C78.00] 10/19/2013  . Mucositis (ulcerative) due to antineoplastic therapy [K12.31] 06/10/2013  . Protein-calorie malnutrition, severe ([place]) [E43] 06/10/2013  . Rash [R21] 06/10/2013  . Gastrostomy tube dyslodgement - replaced 05/06/2013 [K94.23] 05/06/2013  . Gastrostomy in place [facility]) [Z93.1] 04/04/2013  . Tonsil cancer ([place]) [C09.9] 03/22/2013  . Diabetic neuropathy, painful ([place]) [E11.40] 03/22/2013  . Diabetes mellitus, type 2 ([place]) [E11.9] 10/18/2012  . Other and unspecified hyperlipidemia [E78.5] 10/18/2012  . Obesity, unspecified [E66.9] 10/18/2012    Past Medical History Past Medical History:  Diagnosis Date  . Allergy   . Anxiety   . Arthritis    knees, HIps, Hands  . Chest pain    10/14  . Complication of anesthesia    bleeding during intubation 04/04/13 due to friability of right tonsillar cancer  .  Constipation   . Depression   . Fibromyalgia   . Neuropathy    compression neuropathy right hip;s/p replacement  . PEG (percutaneous endoscopic gastrostomy) status ([place])   . Pneumonia 2012  . 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 Tonsillar cancer ([place])   . Type II diabetes mellitus ([place])   . Urethral stricture    s/p dilitation    Past Surgical History Past Surgical History:  Procedure Laterality Date  . CYSTOSCOPY     Dr. Karsten Ro  . GASTROSTOMY TUBE PLACEMENT  04/04/2013  . LAPAROSCOPIC GASTROSTOMY N/A 04/04/2013   Procedure: LAPAROSCOPIC GASTROSTOMY TUBE PLACEMENT ;  Surgeon: Ralene Ok, MD;  Location: [place];  Service: General;  Laterality: N/A;  . MULTIPLE EXTRACTIONS WITH ALVEOLOPLASTY N/A 04/14/2013   Procedure: Extraction of tooth #'s 1,2,3,4,5,6,7,8,9,10,11,12,13,14,15,17,18,19,20,21,22,23,24,25,26,27,28,29, 30, 31, and 32 with alveoloplasty and bilateral mandibular tori reductions.;  Surgeon: Lenn Cal, DDS;  Location: [place];  Service: Oral Surgery;   Laterality: N/A;  . NASAL HEMORRHAGE CONTROL N/A 04/04/2013   Procedure: Control of oropharyngeal hemorrhage;  Surgeon: Ascencion Dike, MD;  Location: [place];  Service: ENT;  Laterality: N/A;  . PORTACATH PLACEMENT Right 04/04/2013  . PORTACATH PLACEMENT N/A 04/04/2013   Procedure: INSERTION PORT-A-CATH;  Surgeon: Ralene Ok, MD;  Location: [place];  Service: General;  Laterality: N/A;  . TOTAL HIP ARTHROPLASTY Right 2000   Dr. Percell Miller    Family History Family History  Problem Relation Age of Onset  . Cancer Cousin        living brain cancer, male  . Cancer Mother        Deceased with leukemia, had uterine ca  . Hyperlipidemia Brother      Social History  reports that he has never smoked. He has never used smokeless tobacco. He reports that he does not drink alcohol or use drugs.  Medications  Current Outpatient Medications:  .  alprazolam (XANAX) 2 MG tablet, Take 2 mg by mouth 3 (three) times daily as needed for sleep or anxiety. , Disp: , Rfl:  .  citalopram (CELEXA) 40 MG tablet, Take 1 tablet by mouth daily., Disp: , Rfl: 4 .  oxyCODONE-acetaminophen (PERCOCET) 10-325 MG tablet, , Disp: , Rfl: 0 .  acetaminophen (TYLENOL) 500 MG tablet, Take 500-1,000 mg by mouth every 4 (four) hours as needed for mild pain. Reported on 12/27/2014, Disp: , Rfl:  No current facility-administered medications for this visit.   Facility-Administered Medications Ordered in Other Visits:  .  sodium chloride flush (NS) 0.9 % injection 10 mL, 10 mL, Intravenous, PRN, Jen Benedict, MD, 10 mL at 05/20/17 1405  Allergies Neurontin [gabapentin] and Dilaudid [hydromorphone hcl]  Review of Systems Review of Systems - Oncology ROS as per HPI otherwise 12 point ROS is negative.   Physical Exam  Vitals Wt Readings from Last 3 Encounters:  05/20/17 216 lb (98 kg)  10/30/16 241 lb 9.6 oz (109.6 kg)  04/25/16 244 lb (110.7 kg)   Temp Readings from Last 3 Encounters:  05/20/17 97.9 F (36.6 C) (Oral)   03/03/17 98.1 F (36.7 C) (Oral)  01/09/17 98.2 F (36.8 C) (Oral)   BP Readings from Last 3 Encounters:  05/20/17 113/77  03/03/17 116/66  01/09/17 116/90   Pulse Readings from Last 3 Encounters:  05/20/17 60  03/03/17 (!) 55  01/09/17 76    Constitutional: Well-developed, well-nourished, and in no distress.   HENT: Head:  Normocephalic and atraumatic.  Mouth/Throat: No oropharyngeal exudate. Mucosa moist.  Some minor difficulty opening mouth Eyes: Pupils are equal, round, and reactive to light. Conjunctivae are normal. No scleral icterus.  Neck: Normal range of motion. Neck supple. No JVD present.  Cardiovascular: Normal rate, regular rhythm and normal heart sounds.  Exam reveals no gallop and no friction rub.   No murmur heard. Pulmonary/Chest: Effort normal and breath sounds normal. No respiratory distress. No wheezes.No rales.  Abdominal: Soft. Bowel sounds are normal. No distension. There is no tenderness. There is no guarding. Scars on abdomen for prior feeding tube.   Musculoskeletal: No edema or tenderness.  Lymphadenopathy: No cervical, axillary  or supraclavicular adenopathy.  Neurological: Alert and oriented to person, place, and time. No cranial nerve deficit.  Skin: Skin is warm and dry. No rash noted. No erythema. No pallor.  Psychiatric: Affect and judgment normal.   Labs Infusion on 05/20/2017  Component Date Value Ref Range Status  . LDH 05/20/2017 101  98 - 192 U/L Final   Performed at [facility], [address] [place], [place] [address]  . Sodium 05/20/2017 139  135 - 145 mmol/L Final  . Potassium 05/20/2017 4.0  3.5 - 5.1 mmol/L Final  . Chloride 05/20/2017 102  101 - 111 mmol/L Final  . CO2 05/20/2017 28  22 - 32 mmol/L Final  . Glucose, Bld 05/20/2017 96  65 - 99 mg/dL Final  . BUN 05/20/2017 13  6 - 20 mg/dL Final  . Creatinine, Ser 05/20/2017 0.72  0.61 - 1.24 mg/dL Final  . Calcium 05/20/2017 9.3  8.9 - 10.3 mg/dL Final  . Total Protein  05/20/2017 7.9  6.5 - 8.1 g/dL Final  . Albumin 05/20/2017 4.2  3.5 - 5.0 g/dL Final  . AST 05/20/2017 23  15 - 41 U/L Final  . ALT 05/20/2017 18  17 - 63 U/L Final  . Alkaline Phosphatase 05/20/2017 54  38 - 126 U/L Final  . Total Bilirubin 05/20/2017 0.9  0.3 - 1.2 mg/dL Final  . GFR calc non Af Amer 05/20/2017 >60  >60 mL/min Final  . GFR calc Af Amer 05/20/2017 >60  >60 mL/min Final   Comment: (NOTE) The eGFR has been calculated using the CKD EPI equation. This calculation has not been validated in all clinical situations. eGFR's persistently <60 mL/min signify possible Chronic Kidney Disease.   Georgiann Hahn gap 05/20/2017 9  5 - 15 Final   Performed at [facility], [address], [place],  [address]  . WBC 05/20/2017 4.5  4.0 - 10.5 K/uL Final  . RBC 05/20/2017 4.47  4.22 - 5.81 MIL/uL Final  . Hemoglobin 05/20/2017 14.2  13.0 - 17.0 g/dL Final  . HCT 05/20/2017 42.3  39.0 - 52.0 % Final  . MCV 05/20/2017 94.6  78.0 - 100.0 fL Final  . MCH 05/20/2017 31.8  26.0 - 34.0 pg Final  . MCHC 05/20/2017 33.6  30.0 - 36.0 g/dL Final  . RDW 05/20/2017 14.1  11.5 - 15.5 % Final  . Platelets 05/20/2017 159  150 - 400 K/uL Final  . Neutrophils Relative % 05/20/2017 72  % Final  . Neutro Abs 05/20/2017 3.3  1.7 - 7.7 K/uL Final  . Lymphocytes Relative 05/20/2017 14  % Final  . Lymphs Abs 05/20/2017 0.6* 0.7 - 4.0 K/uL Final  . Monocytes Relative 05/20/2017 8  % Final  . Monocytes Absolute 05/20/2017 0.4  0.1 - 1.0 K/uL Final  . Eosinophils Relative 05/20/2017  5  % Final  . Eosinophils Absolute 05/20/2017 0.2  0.0 - 0.7 K/uL Final  . Basophils Relative 05/20/2017 1  % Final  . Basophils Absolute 05/20/2017 0.1  0.0 - 0.1 K/uL Final   Performed at [facility], [address], [place], [place] [address]     Pathology Orders Placed This Encounter  Procedures  . CBC with Differential/Platelet    Standing Status:   Future    Number of Occurrences:   1    Standing Expiration Date:    05/21/2018  . Comprehensive metabolic panel    Standing Status:   Future    Number of Occurrences:   1    Standing Expiration Date:   05/21/2018  . Lactate dehydrogenase    Standing Status:   Future    Number of Occurrences:   1    Standing Expiration Date:   05/21/2018       Zoila Shutter MD

## 2017-05-20 NOTE — Progress Notes (Signed)
[name] presented for Portacath access and flush.  Portacath located right chest wall accessed with  H 20 needle.  Good blood return present. Portacath flushed with 53ml NS and 500U/46ml Heparin and needle removed intact.  Procedure tolerated well and without incident.  Discharged ambulatory.

## 2017-05-25 DIAGNOSIS — F419 Anxiety disorder, unspecified: Secondary | ICD-10-CM | POA: Diagnosis not present

## 2017-05-25 DIAGNOSIS — Z6827 Body mass index (BMI) 27.0-27.9, adult: Secondary | ICD-10-CM | POA: Diagnosis not present

## 2017-05-25 DIAGNOSIS — G64 Other disorders of peripheral nervous system: Secondary | ICD-10-CM | POA: Diagnosis not present

## 2017-05-25 DIAGNOSIS — G894 Chronic pain syndrome: Secondary | ICD-10-CM | POA: Diagnosis not present

## 2017-08-20 ENCOUNTER — Inpatient Hospital Stay (HOSPITAL_COMMUNITY): Payer: Medicare Other

## 2017-08-25 ENCOUNTER — Other Ambulatory Visit: Payer: Self-pay

## 2017-08-25 ENCOUNTER — Inpatient Hospital Stay (HOSPITAL_COMMUNITY): Payer: Medicare Other | Attending: Oncology

## 2017-08-25 ENCOUNTER — Encounter (HOSPITAL_COMMUNITY): Payer: Self-pay

## 2017-08-25 VITALS — BP 127/66 | HR 61 | Temp 97.9°F | Resp 18

## 2017-08-25 DIAGNOSIS — Z95828 Presence of other vascular implants and grafts: Secondary | ICD-10-CM

## 2017-08-25 DIAGNOSIS — Z452 Encounter for adjustment and management of vascular access device: Secondary | ICD-10-CM | POA: Diagnosis present

## 2017-08-25 DIAGNOSIS — Z808 Family history of malignant neoplasm of other organs or systems: Secondary | ICD-10-CM | POA: Diagnosis not present

## 2017-08-25 DIAGNOSIS — C7802 Secondary malignant neoplasm of left lung: Secondary | ICD-10-CM | POA: Insufficient documentation

## 2017-08-25 DIAGNOSIS — Z79899 Other long term (current) drug therapy: Secondary | ICD-10-CM | POA: Insufficient documentation

## 2017-08-25 DIAGNOSIS — C099 Malignant neoplasm of tonsil, unspecified: Secondary | ICD-10-CM | POA: Insufficient documentation

## 2017-08-25 MED ORDER — HEPARIN SOD (PORK) LOCK FLUSH 100 UNIT/ML IV SOLN
500.0000 [IU] | Freq: Once | INTRAVENOUS | Status: AC
  Administered 2017-08-25: 500 [IU] via INTRAVENOUS

## 2017-08-25 MED ORDER — SODIUM CHLORIDE 0.9% FLUSH
10.0000 mL | INTRAVENOUS | Status: DC | PRN
  Administered 2017-08-25: 10 mL via INTRAVENOUS
  Filled 2017-08-25: qty 10

## 2017-08-25 NOTE — Patient Instructions (Signed)
[place] at [facility] Discharge Instructions  Port flush done today Follow up as scheduled.   Thank you for choosing [place] at [facility] to provide your oncology and hematology care.  To afford each patient quality time with our provider, please arrive at least 15 minutes before your scheduled appointment time.   If you have a lab appointment with the [place] please come in thru the  Main Entrance and check in at the main information desk  You need to re-schedule your appointment should you arrive 10 or more minutes late.  We strive to give you quality time with our providers, and arriving late affects you and other patients whose appointments are after yours.  Also, if you no show three or more times for appointments you may be dismissed from the clinic at the providers discretion.     Again, thank you for choosing [facility].  Our hope is that these requests will decrease the amount of time that you wait before being seen by our physicians.       _____________________________________________________________  Should you have questions after your visit to [facility], please contact our office at [phone] between the hours of 8:00 a.m. and 4:30 p.m.  Voicemails left after 4:00 p.m. will not be returned until the following business day.  For prescription refill requests, have your pharmacy contact our office and allow 72 hours.    Cancer Center Support Programs:   > Cancer Support Group  2nd Tuesday of the month 1pm-2pm, Journey Room

## 2017-08-25 NOTE — Progress Notes (Signed)
[name] presented for Portacath access and flush. Portacath located right chest wall accessed with 20 in needle. Good blood return present. Portacath flushed with 38ml NS and 500U/70ml Heparin and needle removed intact. Procedure without incident. Patient tolerated procedure well.  Vitals stable and discharged home from clinic ambulatory. Follow up as scheduled.

## 2017-08-31 DIAGNOSIS — F419 Anxiety disorder, unspecified: Secondary | ICD-10-CM | POA: Diagnosis not present

## 2017-08-31 DIAGNOSIS — Z6826 Body mass index (BMI) 26.0-26.9, adult: Secondary | ICD-10-CM | POA: Diagnosis not present

## 2017-08-31 DIAGNOSIS — G894 Chronic pain syndrome: Secondary | ICD-10-CM | POA: Diagnosis not present

## 2017-08-31 DIAGNOSIS — G64 Other disorders of peripheral nervous system: Secondary | ICD-10-CM | POA: Diagnosis not present

## 2017-11-13 ENCOUNTER — Other Ambulatory Visit (HOSPITAL_COMMUNITY): Payer: Self-pay

## 2017-11-17 ENCOUNTER — Encounter (HOSPITAL_COMMUNITY): Payer: Self-pay

## 2017-11-17 ENCOUNTER — Inpatient Hospital Stay (HOSPITAL_COMMUNITY): Payer: Medicare Other | Attending: Oncology

## 2017-11-17 VITALS — BP 126/60 | HR 57 | Temp 97.8°F | Resp 18

## 2017-11-17 DIAGNOSIS — C7800 Secondary malignant neoplasm of unspecified lung: Secondary | ICD-10-CM | POA: Diagnosis not present

## 2017-11-17 DIAGNOSIS — G629 Polyneuropathy, unspecified: Secondary | ICD-10-CM | POA: Insufficient documentation

## 2017-11-17 DIAGNOSIS — Z79899 Other long term (current) drug therapy: Secondary | ICD-10-CM | POA: Insufficient documentation

## 2017-11-17 DIAGNOSIS — Z452 Encounter for adjustment and management of vascular access device: Secondary | ICD-10-CM | POA: Diagnosis present

## 2017-11-17 DIAGNOSIS — Z9221 Personal history of antineoplastic chemotherapy: Secondary | ICD-10-CM | POA: Diagnosis not present

## 2017-11-17 DIAGNOSIS — C7802 Secondary malignant neoplasm of left lung: Secondary | ICD-10-CM

## 2017-11-17 DIAGNOSIS — F419 Anxiety disorder, unspecified: Secondary | ICD-10-CM | POA: Insufficient documentation

## 2017-11-17 DIAGNOSIS — Z923 Personal history of irradiation: Secondary | ICD-10-CM | POA: Insufficient documentation

## 2017-11-17 DIAGNOSIS — C099 Malignant neoplasm of tonsil, unspecified: Secondary | ICD-10-CM | POA: Diagnosis not present

## 2017-11-17 LAB — CBC WITH DIFFERENTIAL/PLATELET
Abs Immature Granulocytes: 0.01 10*3/uL (ref 0.00–0.07)
BASOS ABS: 0.1 10*3/uL (ref 0.0–0.1)
BASOS PCT: 1 %
Eosinophils Absolute: 0.1 10*3/uL (ref 0.0–0.5)
Eosinophils Relative: 4 %
HCT: 41.1 % (ref 39.0–52.0)
HEMOGLOBIN: 13.3 g/dL (ref 13.0–17.0)
IMMATURE GRANULOCYTES: 0 %
Lymphocytes Relative: 17 %
Lymphs Abs: 0.6 10*3/uL — ABNORMAL LOW (ref 0.7–4.0)
MCH: 31 pg (ref 26.0–34.0)
MCHC: 32.4 g/dL (ref 30.0–36.0)
MCV: 95.8 fL (ref 80.0–100.0)
MONO ABS: 0.3 10*3/uL (ref 0.1–1.0)
Monocytes Relative: 10 %
NEUTROS ABS: 2.4 10*3/uL (ref 1.7–7.7)
NEUTROS PCT: 68 %
NRBC: 0 % (ref 0.0–0.2)
PLATELETS: 145 10*3/uL — AB (ref 150–400)
RBC: 4.29 MIL/uL (ref 4.22–5.81)
RDW: 14 % (ref 11.5–15.5)
WBC: 3.6 10*3/uL — ABNORMAL LOW (ref 4.0–10.5)

## 2017-11-17 LAB — COMPREHENSIVE METABOLIC PANEL
ALT: 14 U/L (ref 0–44)
ANION GAP: 7 (ref 5–15)
AST: 20 U/L (ref 15–41)
Albumin: 4.1 g/dL (ref 3.5–5.0)
Alkaline Phosphatase: 48 U/L (ref 38–126)
BUN: 14 mg/dL (ref 8–23)
CHLORIDE: 103 mmol/L (ref 98–111)
CO2: 26 mmol/L (ref 22–32)
Calcium: 8.9 mg/dL (ref 8.9–10.3)
Creatinine, Ser: 0.73 mg/dL (ref 0.61–1.24)
GFR calc Af Amer: >60 mL/min (ref >60)
Glucose, Bld: 88 mg/dL (ref 70–99)
POTASSIUM: 3.8 mmol/L (ref 3.5–5.1)
Sodium: 136 mmol/L (ref 135–145)
TOTAL PROTEIN: 7.5 g/dL (ref 6.5–8.1)
Total Bilirubin: 0.9 mg/dL (ref 0.3–1.2)

## 2017-11-17 LAB — LACTATE DEHYDROGENASE: LDH: 93 U/L — AB (ref 98–192)

## 2017-11-17 MED ORDER — HEPARIN SOD (PORK) LOCK FLUSH 100 UNIT/ML IV SOLN
500.0000 [IU] | Freq: Once | INTRAVENOUS | Status: AC
  Administered 2017-11-17: 500 [IU] via INTRAVENOUS

## 2017-11-17 MED ORDER — SODIUM CHLORIDE 0.9% FLUSH
20.0000 mL | INTRAVENOUS | Status: DC | PRN
  Administered 2017-11-17: 20 mL via INTRAVENOUS
  Filled 2017-11-17: qty 20

## 2017-11-17 NOTE — Patient Instructions (Signed)
[place] at [facility] Discharge Instructions  Labs drawn from portacath then flushed per protocol. Follow-up as scheduled. Call clinic for any questions or concerns   Thank you for choosing [place] at [facility] to provide your oncology and hematology care.  To afford each patient quality time with our provider, please arrive at least 15 minutes before your scheduled appointment time.   If you have a lab appointment with the [place] please come in thru the  Main Entrance and check in at the main information desk  You need to re-schedule your appointment should you arrive 10 or more minutes late.  We strive to give you quality time with our providers, and arriving late affects you and other patients whose appointments are after yours.  Also, if you no show three or more times for appointments you may be dismissed from the clinic at the providers discretion.     Again, thank you for choosing [facility].  Our hope is that these requests will decrease the amount of time that you wait before being seen by our physicians.       _____________________________________________________________  Should you have questions after your visit to [facility], please contact our office at [phone] between the hours of 8:00 a.m. and 4:30 p.m.  Voicemails left after 4:00 p.m. will not be returned until the following business day.  For prescription refill requests, have your pharmacy contact our office and allow 72 hours.    Cancer Center Support Programs:   > Cancer Support Group  2nd Tuesday of the month 1pm-2pm, Journey Room

## 2017-11-17 NOTE — Progress Notes (Signed)
[name] tolerated port lab draw with flush well without complaints or incident. Port accessed with 20 gauge needle with blood drawn for labs ordered then flushed with 20 ml NS and 5 ml Heparin easily per protocol then de-accessed. VSS Pt discharged self ambulatory in satisfactory condition

## 2017-11-19 ENCOUNTER — Other Ambulatory Visit: Payer: Self-pay

## 2017-11-19 ENCOUNTER — Inpatient Hospital Stay (HOSPITAL_COMMUNITY): Payer: Medicare Other | Attending: Hematology | Admitting: Hematology

## 2017-11-19 ENCOUNTER — Encounter (HOSPITAL_COMMUNITY): Payer: Self-pay | Admitting: Hematology

## 2017-11-19 ENCOUNTER — Ambulatory Visit (HOSPITAL_COMMUNITY): Payer: Self-pay | Admitting: Hematology

## 2017-11-19 VITALS — BP 113/78 | HR 66 | Temp 97.1°F | Resp 20 | Wt 204.7 lb

## 2017-11-19 DIAGNOSIS — Z79899 Other long term (current) drug therapy: Secondary | ICD-10-CM | POA: Diagnosis not present

## 2017-11-19 DIAGNOSIS — C7800 Secondary malignant neoplasm of unspecified lung: Secondary | ICD-10-CM | POA: Insufficient documentation

## 2017-11-19 DIAGNOSIS — C099 Malignant neoplasm of tonsil, unspecified: Secondary | ICD-10-CM | POA: Insufficient documentation

## 2017-11-19 DIAGNOSIS — Z9221 Personal history of antineoplastic chemotherapy: Secondary | ICD-10-CM | POA: Insufficient documentation

## 2017-11-19 DIAGNOSIS — Z923 Personal history of irradiation: Secondary | ICD-10-CM | POA: Insufficient documentation

## 2017-11-19 NOTE — Patient Instructions (Signed)
[place] at [facility] Discharge Instructions  Today you saw Dr. Delton Coombes  Thank you for choosing [place] at [facility] to provide your oncology and hematology care.  To afford each patient quality time with our provider, please arrive at least 15 minutes before your scheduled appointment time.   If you have a lab appointment with the [place] please come in thru the  Main Entrance and check in at the main information desk  You need to re-schedule your appointment should you arrive 10 or more minutes late.  We strive to give you quality time with our providers, and arriving late affects you and other patients whose appointments are after yours.  Also, if you no show three or more times for appointments you may be dismissed from the clinic at the providers discretion.     Again, thank you for choosing [facility].  Our hope is that these requests will decrease the amount of time that you wait before being seen by our physicians.       _____________________________________________________________  Should you have questions after your visit to [facility], please contact our office at [phone] between the hours of 8:00 a.m. and 4:30 p.m.  Voicemails left after 4:00 p.m. will not be returned until the following business day.  For prescription refill requests, have your pharmacy contact our office and allow 72 hours.    Cancer Center Support Programs:   > Cancer Support Group  2nd Tuesday of the month 1pm-2pm, Journey Room

## 2017-11-19 NOTE — Assessment & Plan Note (Addendum)
1.  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 - Does not report any worsening of dysphagia.  He is able to eat all sorts of foods without any problems. -Today's physical examination did not reveal any oropharyngeal masses.  No adenopathy was palpable. -We will obtain a CT scan of the chest, abdomen and pelvis to evaluate for any metastatic disease.  We will also check his TSH level.  2.  Peripheral neuropathy: - He does have peripheral neuropathy mostly at nighttime in the feet.  He is taking Percocet which helps with the pain.

## 2017-11-19 NOTE — Progress Notes (Signed)
[place], [place] [address]   CLINIC:  Medical Oncology/Hematology  PCP:  Sharilyn Sites, [place] [address] [phone]   REASON FOR VISIT: Follow-up for tonsillar cancer  CURRENT THERAPY: Observation  BRIEF ONCOLOGIC HISTORY:  Oncology History   Tonsil cancer, HPV positive   Primary site: Pharynx - Oropharynx (Right)   Staging method: AJCC 7th Edition   Clinical free text: HPV positive   Clinical: Stage IVA (T3, N2c, M0) signed by Heath Lark, MD on 04/20/2013  9:29 PM   Summary: Stage IVA (T3, N2c, M0)       Tonsil cancer ([place])   02/01/2013 Imaging    Ultrasound of the neck revealed bilateral lymphadenopathy in the submandibular region    03/16/2013 Imaging    CT scan of the neck show large right tonsil mass measured 3.9 cm in maximum dimension as well as bilateral lymphadenopathy, the largest lymph node measures 31 mm. There is also a left thyroid mass measured 44 mm    03/21/2013 Procedure    The patient was seen by ENT with laryngoscopy and biopsy. Pathology is pending    03/25/2013 Imaging    PET scan showed large hypermetabolic soft tissue mass in the region of the right palatine tonsil with bilateral cervical hypermetabolic lymphadenopathy indicative of metastatic disease,    04/04/2013 Surgery    He underwent placement of Port-A-Cath and feeding tube    04/14/2013 Procedure    The patient underwent teeth extraction.    05/02/2013 - 06/13/2013 Chemotherapy    The dosage of chemotherapy was interrupted many times due to side effects. His treatment is terminated early due to severe ulcerated skin toxicity.    05/02/2013 - 06/22/2013 Radiation Therapy    Right tonsil and bilateral neck / 70 Gy in 35 fractions to gross disease, 63 Gy in 35 fractions to high risk nodal echelons, and 56 Gy in 35 fractions to intermediate risk nodal echelons 05/02/2013-06/22/2013     10/18/2013 Imaging    PET CT scan showed  complete response in the oropharynx. However, he has evidence of new bilateral pulmonary metastasis.    12/16/2013 Imaging    Repeat PET CT scan show significant progression of pulmonary metastases     01/25/2014 Pathology Results    Squamous Cell Carcinoma, CT guided biopsy of RUL nodule    02/08/2014 - 04/20/2014 Chemotherapy    Cisplatin/Taxol weekly x 8    04/17/2014 Imaging    PET Interval improvement in size and hypermetabolism of the patient's multiple bilateral pulmonary metastases. No evidence for abnormal FDG uptake in the tongue base on the current study.Stable enlargement of the left thyroid lobe.     05/04/2014 - 05/11/2014 Chemotherapy    Xeloda 2000 mg in AM and 1500 mg in PM 7 days on and 7 days off    05/11/2014 Adverse Reaction    Decreased appetite and increased weakness.    12/14/2014 Imaging    possibility of aspiration pneumonitis.Stable subcentimeter non-hypermetabolic pulmonary nodules. No new pulmonary nodules.No metabolic evidence of local tumor recurrence in the pharynx.No metabolic evidence of recurrent nodal metastases     04/04/2015 PET scan    mild pathcy/nodular opacity in medial LLL, new. infection remains favored diagnosis. tumor not excluded. increased interstitial markings with superimposed tree-in-bud nodularity/ground glass opacity in RML and BLL, chronic aspiration    04/25/2015 Imaging    Modified barium swallow, post XRT appearance of epiglottis with thickened blunted appearance. epiglottic turnover  poor, large and silent or weakly responvie aspiration     05/22/2015 Imaging    CT chest, marked improvement in bibasilar airspace nodular opacities c/w resolving pulm infection, sub cm nodules unchanged    07/25/2015 PET scan    No evidence of recurrent or metastatic disease, mild scarring with faint ground glass opacity in the bilateral lower lobes likely sequela of prior/chronic aspiration    01/23/2016 PET scan    1. Stable exam. No evidence for  residual/recurrent hypermetabolic mass or adenopathy. 2. Stable biapical pleuroparenchymal scarring and 7 mm right middle lobe lung nodule. 3. Aortic atherosclerosis and coronary artery calcifications.      CANCER STAGING: Cancer Staging Tonsil cancer ([place]) Staging form: Pharynx - Oropharynx, AJCC 7th Edition - Pathologic: No stage assigned - Unsigned - Clinical stage from 04/20/2013: Stage IVA (T3, N2c, M0, Free text: HPV positive) - Signed by Heath Lark, MD on 04/20/2013    INTERVAL HISTORY:  [name] 67 y.o. male returns for routine follow-up for tonsillar cancer. Patient is here today and doing well since finishing treatment. She still reports a little trouble swallowing however he eats well and manages. He has numbness and burning in his feet and take medication for that. He states it is worse at night. He denies any nausea, vomiting, or diarrhea. Denies any new cough. He reports his appetite at 75% and he maintains his weight well. His energy level is 75%.     REVIEW OF SYSTEMS:  Review of Systems  HENT:   Positive for trouble swallowing.   Neurological: Positive for numbness.  All other systems reviewed and are negative.    PAST MEDICAL/SURGICAL HISTORY:  Past Medical History:  Diagnosis Date  . Allergy   . Anxiety   . Arthritis    knees, HIps, Hands  . Chest pain    10/14  . Complication of anesthesia    bleeding during intubation 04/04/13 due to friability of right tonsillar cancer  . Constipation   . Depression   . Fibromyalgia   . Neuropathy    compression neuropathy right hip;s/p replacement  . PEG (percutaneous endoscopic gastrostomy) status ([place])   . Pneumonia 2012  . 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 Tonsillar cancer ([place])   . Type II diabetes mellitus ([place])   . Urethral stricture    s/p dilitation   Past Surgical History:  Procedure Laterality Date  . CYSTOSCOPY     Dr.  Karsten Ro  . GASTROSTOMY TUBE PLACEMENT  04/04/2013  . LAPAROSCOPIC GASTROSTOMY N/A 04/04/2013   Procedure: LAPAROSCOPIC GASTROSTOMY TUBE PLACEMENT ;  Surgeon: Ralene Ok, MD;  Location: [place];  Service: General;  Laterality: N/A;  . MULTIPLE EXTRACTIONS WITH ALVEOLOPLASTY N/A 04/14/2013   Procedure: Extraction of tooth #'s 1,2,3,4,5,6,7,8,9,10,11,12,13,14,15,17,18,19,20,21,22,23,24,25,26,27,28,29, 30, 31, and 32 with alveoloplasty and bilateral mandibular tori reductions.;  Surgeon: Lenn Cal, DDS;  Location: [place];  Service: Oral Surgery;  Laterality: N/A;  . NASAL HEMORRHAGE CONTROL N/A 04/04/2013   Procedure: Control of oropharyngeal hemorrhage;  Surgeon: Ascencion Dike, MD;  Location: [place];  Service: ENT;  Laterality: N/A;  . PORTACATH PLACEMENT Right 04/04/2013  . PORTACATH PLACEMENT N/A 04/04/2013   Procedure: INSERTION PORT-A-CATH;  Surgeon: Ralene Ok, MD;  Location: [facility];  Service: General;  Laterality: N/A;  . TOTAL HIP ARTHROPLASTY Right 2000   Dr. Percell Miller     SOCIAL HISTORY:  Social History   Socioeconomic History  .  Marital status: Married    Spouse name: Not on file  . Number of children: Not on file  . Years of education: Not on file  . Highest education level: Not on file  Occupational History  . Not on file  Social Needs  . Financial resource strain: Not on file  . Food insecurity:    Worry: Not on file    Inability: Not on file  . Transportation needs:    Medical: Not on file    Non-medical: Not on file  Tobacco Use  . Smoking status: Never Smoker  . Smokeless tobacco: Never Used  Substance and Sexual Activity  . Alcohol use: No    Comment: Occasional beer  . Drug use: No  . Sexual activity: Yes    Comment: married  Lifestyle  . Physical activity:    Days per week: Not on file    Minutes per session: Not on file  . Stress: Not on file  Relationships  . Social connections:    Talks on phone: Not on file    Gets together: Not on file     Attends religious service: Not on file    Active member of club or organization: Not on file    Attends meetings of clubs or organizations: Not on file    Relationship status: Not on file  . Intimate partner violence:    Fear of current or ex partner: Not on file    Emotionally abused: Not on file    Physically abused: Not on file    Forced sexual activity: Not on file  Other Topics Concern  . Not on file  Social History Narrative  . Not on file    FAMILY HISTORY:  Family History  Problem Relation Age of Onset  . Cancer Cousin        living brain cancer, male  . Cancer Mother        Deceased with leukemia, had uterine ca  . Hyperlipidemia Brother     CURRENT MEDICATIONS:  Outpatient Encounter Medications as of 11/19/2017  Medication Sig Note  . acetaminophen (TYLENOL) 500 MG tablet Take 500-1,000 mg by mouth every 4 (four) hours as needed for mild pain. Reported on 12/27/2014   . alprazolam (XANAX) 2 MG tablet Take 2 mg by mouth 3 (three) times daily as needed for sleep or anxiety.  04/04/2013: 530 am   1/2  tab  . citalopram (CELEXA) 40 MG tablet Take 1 tablet by mouth daily.   [name] oxyCODONE-acetaminophen (PERCOCET) 10-325 MG tablet Take 1 tablet by mouth every 8 (eight) hours as needed.    . tamsulosin (FLOMAX) 0.4 MG CAPS capsule    . [DISCONTINUED] oxyCODONE-acetaminophen (PERCOCET) 10-325 MG tablet     No facility-administered encounter medications on file as of 11/19/2017.     ALLERGIES:  Allergies  Allergen Reactions  . Neurontin [Gabapentin] Other (See Comments)    Causes seizures  . Dilaudid [Hydromorphone Hcl] Other (See Comments)    Oxygen saturation drops     PHYSICAL EXAM:  ECOG Performance status: 1  Vitals:   11/19/17 1137  BP: 113/78  Pulse: 66  Resp: 20  Temp: (!) 97.1 F (36.2 C)  SpO2: 100%   Filed Weights   11/19/17 1137  Weight: 204 lb 11.2 oz (92.9 kg)    Physical Exam  Constitutional: He is oriented to person, place, and time. He  appears well-developed and well-nourished.  Musculoskeletal: Normal range of motion.  Neurological: He is alert and  oriented to person, place, and time.  Skin: Skin is warm and dry.  Psychiatric: He has a normal mood and affect. His behavior is normal. Judgment and thought content normal.  HEENT: No oropharyngeal masses.  No palpable adenopathy.  No palpable thyromegaly. Lymphadenopathy: No palpable supraclavicular axillary or inguinal adenopathy. Abdomen: Soft nontender with no palpable organomegaly.   LABORATORY DATA:  I have reviewed the labs as listed.  CBC    Component Value Date/Time   WBC 3.6 (L) 11/17/2017 1429   RBC 4.29 11/17/2017 1429   HGB 13.3 11/17/2017 1429   HGB 13.9 12/16/2013 1304   HCT 41.1 11/17/2017 1429   HCT 42.4 12/16/2013 1304   PLT 145 (L) 11/17/2017 1429   PLT 167 12/16/2013 1304   MCV 95.8 11/17/2017 1429   MCV 91.0 12/16/2013 1304   MCH 31.0 11/17/2017 1429   MCHC 32.4 11/17/2017 1429   RDW 14.0 11/17/2017 1429   RDW 15.3 (H) 12/16/2013 1304   LYMPHSABS 0.6 (L) 11/17/2017 1429   LYMPHSABS 0.6 (L) 12/16/2013 1304   MONOABS 0.3 11/17/2017 1429   MONOABS 0.6 12/16/2013 1304   EOSABS 0.1 11/17/2017 1429   EOSABS 0.2 12/16/2013 1304   BASOSABS 0.1 11/17/2017 1429   BASOSABS 0.1 12/16/2013 1304   CMP Latest Ref Rng & Units 11/17/2017 05/20/2017 10/30/2016  Glucose 70 - 99 mg/dL 88 96 84  BUN 8 - 23 mg/dL 14 13 11   Creatinine 0.61 - 1.24 mg/dL 0.73 0.72 0.87  Sodium 135 - 145 mmol/L 136 139 141  Potassium 3.5 - 5.1 mmol/L 3.8 4.0 3.9  Chloride 98 - 111 mmol/L 103 102 106  CO2 22 - 32 mmol/L 26 28 26   Calcium 8.9 - 10.3 mg/dL 8.9 9.3 9.2  Total Protein 6.5 - 8.1 g/dL 7.5 7.9 8.0  Total Bilirubin 0.3 - 1.2 mg/dL 0.9 0.9 0.8  Alkaline Phos 38 - 126 U/L 48 54 62  AST 15 - 41 U/L 20 23 41  ALT 0 - 44 U/L 14 18 30        DIAGNOSTIC IMAGING:  I have reviewed his PET CT scan from 2018 and discussed with the patient.    ASSESSMENT & PLAN:    Tonsil cancer 1.  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 - Does not report any worsening of dysphagia.  He is able to eat all sorts of foods without any problems. -Today's physical examination did not reveal any oropharyngeal masses.  No adenopathy was palpable. -We will obtain a CT scan of the chest, abdomen and pelvis to evaluate for any metastatic disease.  We will also check his TSH level.  2.  Peripheral neuropathy: - He does have peripheral neuropathy mostly at nighttime in the feet.  He is taking Percocet which helps with the pain.      Orders placed this encounter:  Orders Placed This Encounter  Procedures  . CT Chest W Contrast  . CT Abdomen Pelvis W Contrast  . Lactate dehydrogenase  . CBC with Differential/Platelet  . Comprehensive metabolic panel  . TSH      [name], [place] [phone]

## 2017-12-01 DIAGNOSIS — E663 Overweight: Secondary | ICD-10-CM | POA: Diagnosis not present

## 2017-12-01 DIAGNOSIS — Z0000 Encounter for general adult medical examination without abnormal findings: Secondary | ICD-10-CM | POA: Diagnosis not present

## 2017-12-01 DIAGNOSIS — N400 Enlarged prostate without lower urinary tract symptoms: Secondary | ICD-10-CM | POA: Diagnosis not present

## 2017-12-01 DIAGNOSIS — Z6825 Body mass index (BMI) 25.0-25.9, adult: Secondary | ICD-10-CM | POA: Diagnosis not present

## 2017-12-02 DIAGNOSIS — R7309 Other abnormal glucose: Secondary | ICD-10-CM | POA: Diagnosis not present

## 2017-12-08 ENCOUNTER — Inpatient Hospital Stay (HOSPITAL_COMMUNITY): Payer: Medicare Other

## 2017-12-08 ENCOUNTER — Ambulatory Visit (HOSPITAL_COMMUNITY)
Admission: RE | Admit: 2017-12-08 | Discharge: 2017-12-08 | Disposition: A | Discharge status: Home / Self Care | Payer: Medicare Other | Source: Ambulatory Visit | Attending: Nurse Practitioner | Admitting: Nurse Practitioner

## 2017-12-08 DIAGNOSIS — C099 Malignant neoplasm of tonsil, unspecified: Secondary | ICD-10-CM | POA: Diagnosis not present

## 2017-12-08 DIAGNOSIS — C3490 Malignant neoplasm of unspecified part of unspecified bronchus or lung: Secondary | ICD-10-CM | POA: Diagnosis not present

## 2017-12-08 DIAGNOSIS — Z510 Encounter for antineoplastic radiation therapy: Secondary | ICD-10-CM | POA: Diagnosis not present

## 2017-12-08 DIAGNOSIS — I350 Nonrheumatic aortic (valve) stenosis: Secondary | ICD-10-CM | POA: Diagnosis not present

## 2017-12-08 LAB — CBC WITH DIFFERENTIAL/PLATELET
Abs Immature Granulocytes: 0.01 10*3/uL (ref 0.00–0.07)
Basophils Absolute: 0.1 10*3/uL (ref 0.0–0.1)
Basophils Relative: 1 %
EOS ABS: 0.2 10*3/uL (ref 0.0–0.5)
EOS PCT: 3 %
HEMATOCRIT: 45 % (ref 39.0–52.0)
HEMOGLOBIN: 14.3 g/dL (ref 13.0–17.0)
IMMATURE GRANULOCYTES: 0 %
LYMPHS ABS: 0.7 10*3/uL (ref 0.7–4.0)
LYMPHS PCT: 15 %
MCH: 30.7 pg (ref 26.0–34.0)
MCHC: 31.8 g/dL (ref 30.0–36.0)
MCV: 96.6 fL (ref 80.0–100.0)
Monocytes Absolute: 0.5 10*3/uL (ref 0.1–1.0)
Monocytes Relative: 10 %
NEUTROS PCT: 71 %
NRBC: 0 % (ref 0.0–0.2)
Neutro Abs: 3.4 10*3/uL (ref 1.7–7.7)
Platelets: 163 10*3/uL (ref 150–400)
RBC: 4.66 MIL/uL (ref 4.22–5.81)
RDW: 14.5 % (ref 11.5–15.5)
WBC: 4.8 10*3/uL (ref 4.0–10.5)

## 2017-12-08 LAB — COMPREHENSIVE METABOLIC PANEL
ALK PHOS: 55 U/L (ref 38–126)
ALT: 15 U/L (ref 0–44)
ANION GAP: 8 (ref 5–15)
AST: 21 U/L (ref 15–41)
Albumin: 4.5 g/dL (ref 3.5–5.0)
BUN: 13 mg/dL (ref 8–23)
CALCIUM: 9.3 mg/dL (ref 8.9–10.3)
CO2: 28 mmol/L (ref 22–32)
Chloride: 103 mmol/L (ref 98–111)
Creatinine, Ser: 1.01 mg/dL (ref 0.61–1.24)
GLUCOSE: 87 mg/dL (ref 70–99)
Potassium: 4.9 mmol/L (ref 3.5–5.1)
Sodium: 139 mmol/L (ref 135–145)
TOTAL PROTEIN: 7.8 g/dL (ref 6.5–8.1)
Total Bilirubin: 0.8 mg/dL (ref 0.3–1.2)

## 2017-12-08 LAB — TSH: TSH: 0.813 u[IU]/mL (ref 0.350–4.500)

## 2017-12-08 LAB — LACTATE DEHYDROGENASE: LDH: 109 U/L (ref 98–192)

## 2017-12-08 MED ORDER — IOPAMIDOL (ISOVUE-300) INJECTION 61%
100.0000 mL | Freq: Once | INTRAVENOUS | Status: AC | PRN
  Administered 2017-12-08: 100 mL via INTRAVENOUS

## 2017-12-09 ENCOUNTER — Inpatient Hospital Stay (HOSPITAL_COMMUNITY): Payer: Medicare Other | Admitting: Hematology

## 2017-12-09 ENCOUNTER — Encounter (HOSPITAL_COMMUNITY): Payer: Self-pay | Admitting: Hematology

## 2017-12-09 ENCOUNTER — Other Ambulatory Visit: Payer: Self-pay

## 2017-12-09 VITALS — BP 125/83 | HR 68 | Temp 97.6°F | Resp 20 | Wt 205.0 lb

## 2017-12-09 DIAGNOSIS — G629 Polyneuropathy, unspecified: Secondary | ICD-10-CM

## 2017-12-09 DIAGNOSIS — C7800 Secondary malignant neoplasm of unspecified lung: Secondary | ICD-10-CM

## 2017-12-09 DIAGNOSIS — Z79899 Other long term (current) drug therapy: Secondary | ICD-10-CM

## 2017-12-09 DIAGNOSIS — C099 Malignant neoplasm of tonsil, unspecified: Secondary | ICD-10-CM

## 2017-12-09 DIAGNOSIS — Z923 Personal history of irradiation: Secondary | ICD-10-CM | POA: Diagnosis not present

## 2017-12-09 DIAGNOSIS — Z9221 Personal history of antineoplastic chemotherapy: Secondary | ICD-10-CM | POA: Diagnosis not present

## 2017-12-09 DIAGNOSIS — F419 Anxiety disorder, unspecified: Secondary | ICD-10-CM | POA: Diagnosis not present

## 2017-12-09 DIAGNOSIS — Z452 Encounter for adjustment and management of vascular access device: Secondary | ICD-10-CM | POA: Diagnosis not present

## 2017-12-09 NOTE — Assessment & Plan Note (Signed)
1.  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 - Does not report any dysphagia.  He is able to eat all sorts of foods without any problems.  Physical examination did not reveal any oropharyngeal masses.  No adenopathy is palpable. - We reviewed the results of the CT scan dated 12/08/2017 which shows stable findings in the chest, abdomen and pelvis without evidence of metastatic disease.   - His TSH was 0.8.  We plan to repeat it in 6 months.  I plan to repeat scans in a year. -He will keep his right chest wall port until 5 years.  He may have it flushed once every 2 to 3 months.  2.  Peripheral neuropathy: - He does have peripheral neuropathy mostly at nighttime in the feet.  He is taking Percocet which helps with the pain.

## 2017-12-09 NOTE — Progress Notes (Signed)
[place], [place] [address]   CLINIC:  Medical Oncology/Hematology  PCP:  Joseph Sites, MD [address] 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  CURRENT THERAPY: Observation  BRIEF ONCOLOGIC HISTORY:  Oncology History   Tonsil cancer, HPV positive   Primary site: Pharynx - Oropharynx (Right)   Staging method: AJCC 7th Edition   Clinical free text: HPV positive   Clinical: Stage IVA (T3, N2c, M0) signed by Heath Lark, MD on 04/20/2013  9:29 PM   Summary: Stage IVA (T3, N2c, M0)       Tonsil cancer ([place])   02/01/2013 Imaging    Ultrasound of the neck revealed bilateral lymphadenopathy in the submandibular region    03/16/2013 Imaging    CT scan of the neck show large right tonsil mass measured 3.9 cm in maximum dimension as well as bilateral lymphadenopathy, the largest lymph node measures 31 mm. There is also a left thyroid mass measured 44 mm    03/21/2013 Procedure    The patient was seen by ENT with laryngoscopy and biopsy. Pathology is pending    03/25/2013 Imaging    PET scan showed large hypermetabolic soft tissue mass in the region of the right palatine tonsil with bilateral cervical hypermetabolic lymphadenopathy indicative of metastatic disease,    04/04/2013 Surgery    He underwent placement of Port-A-Cath and feeding tube    04/14/2013 Procedure    The patient underwent teeth extraction.    05/02/2013 - 06/13/2013 Chemotherapy    The dosage of chemotherapy was interrupted many times due to side effects. His treatment is terminated early due to severe ulcerated skin toxicity.    05/02/2013 - 06/22/2013 Radiation Therapy    Right tonsil and bilateral neck / 70 Gy in 35 fractions to gross disease, 63 Gy in 35 fractions to high risk nodal echelons, and 56 Gy in 35 fractions to intermediate risk nodal echelons 05/02/2013-06/22/2013       10/18/2013 Imaging    PET CT scan showed complete response in the oropharynx. However, he has evidence of new bilateral pulmonary metastasis.    12/16/2013 Imaging    Repeat PET CT scan show significant progression of pulmonary metastases     01/25/2014 Pathology Results    Squamous Cell Carcinoma, CT guided biopsy of RUL nodule    02/08/2014 - 04/20/2014 Chemotherapy    Cisplatin/Taxol weekly x 8    04/17/2014 Imaging    PET Interval improvement in size and hypermetabolism of the patient's multiple bilateral pulmonary metastases. No evidence for abnormal FDG uptake in the tongue base on the current study.Stable enlargement of the left thyroid lobe.     05/04/2014 - 05/11/2014 Chemotherapy    Xeloda 2000 mg in AM and 1500 mg in PM 7 days on and 7 days off    05/11/2014 Adverse Reaction    Decreased appetite and increased weakness.    12/14/2014 Imaging    possibility of aspiration pneumonitis.Stable subcentimeter non-hypermetabolic pulmonary nodules. No new pulmonary nodules.No metabolic evidence of local tumor recurrence in the pharynx.No metabolic evidence of recurrent nodal metastases     04/04/2015 PET scan    mild pathcy/nodular opacity in medial LLL, new. infection remains favored diagnosis. tumor not excluded. increased interstitial markings with superimposed tree-in-bud nodularity/ground glass opacity in RML and BLL, chronic aspiration    04/25/2015 Imaging    Modified barium swallow,  post XRT appearance of epiglottis with thickened blunted appearance. epiglottic turnover poor, large and silent or weakly responvie aspiration     05/22/2015 Imaging    CT chest, marked improvement in bibasilar airspace nodular opacities c/w resolving pulm infection, sub cm nodules unchanged    07/25/2015 PET scan    No evidence of recurrent or metastatic disease, mild scarring with faint ground glass opacity in the bilateral lower lobes likely sequela of prior/chronic aspiration    01/23/2016 PET scan     1. Stable exam. No evidence for residual/recurrent hypermetabolic mass or adenopathy. 2. Stable biapical pleuroparenchymal scarring and 7 mm right middle lobe lung nodule. 3. Aortic atherosclerosis and coronary artery calcifications.      CANCER STAGING: Cancer Staging Tonsil cancer ([place]) Staging form: Pharynx - Oropharynx, AJCC 7th Edition - Pathologic: No stage assigned - Unsigned - Clinical stage from 04/20/2013: Stage IVA (T3, N2c, M0, Free text: HPV positive) - Signed by Heath Lark, MD on 04/20/2013    INTERVAL HISTORY:  [name] 67 y.o. male returns for routine follow-up for tonsillar cancer. He is here today with his wife doing well. He does report problems sleeping at night but otherwise he is doing great. He has no other complaints at this time. He denies any trouble eating or pain. Denies any fevers or recent infections. Denies any bleeding or hemoptysis. He reports his appetite at 100% and has no problem maintaining his weight. His energy level is 75%. He lives at home with his wife and performs all his own ADLs.     REVIEW OF SYSTEMS:  Review of Systems  Psychiatric/Behavioral: Positive for sleep disturbance.  All other systems reviewed and are negative.    PAST MEDICAL/SURGICAL HISTORY:  Past Medical History:  Diagnosis Date  . Allergy   . Anxiety   . Arthritis    knees, HIps, Hands  . Chest pain    10/14  . Complication of anesthesia    bleeding during intubation 04/04/13 due to friability of right tonsillar cancer  . Constipation   . Depression   . Fibromyalgia   . Neuropathy    compression neuropathy right hip;s/p replacement  . PEG (percutaneous endoscopic gastrostomy) status ([facility])   . Pneumonia 2012  . 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 Tonsillar cancer ([facility])   . Type II diabetes mellitus ([facility])   . Urethral stricture    s/p dilitation   Past Surgical History:  Procedure  Laterality Date  . CYSTOSCOPY     Dr. Karsten Ro  . GASTROSTOMY TUBE PLACEMENT  04/04/2013  . LAPAROSCOPIC GASTROSTOMY N/A 04/04/2013   Procedure: LAPAROSCOPIC GASTROSTOMY TUBE PLACEMENT ;  Surgeon: Ralene Ok, MD;  Location: [facility];  Service: General;  Laterality: N/A;  . MULTIPLE EXTRACTIONS WITH ALVEOLOPLASTY N/A 04/14/2013   Procedure: Extraction of tooth #'s 1,2,3,4,5,6,7,8,9,10,11,12,13,14,15,17,18,19,20,21,22,23,24,25,26,27,28,29, 30, 31, and 32 with alveoloplasty and bilateral mandibular tori reductions.;  Surgeon: Lenn Cal, DDS;  Location: [facility];  Service: Oral Surgery;  Laterality: N/A;  . NASAL HEMORRHAGE CONTROL N/A 04/04/2013   Procedure: Control of oropharyngeal hemorrhage;  Surgeon: Ascencion Dike, MD;  Location: [facility];  Service: ENT;  Laterality: N/A;  . PORTACATH PLACEMENT Right 04/04/2013  . PORTACATH PLACEMENT N/A 04/04/2013   Procedure: INSERTION PORT-A-CATH;  Surgeon: Ralene Ok, MD;  Location: [facility];  Service: General;  Laterality: N/A;  . TOTAL HIP ARTHROPLASTY Right 2000   Dr. Percell Miller  SOCIAL HISTORY:  Social History   Socioeconomic History  . Marital status: Married    Spouse name: Not on file  . Number of children: Not on file  . Years of education: Not on file  . Highest education level: Not on file  Occupational History  . Not on file  Social Needs  . Financial resource strain: Not on file  . Food insecurity:    Worry: Not on file    Inability: Not on file  . Transportation needs:    Medical: Not on file    Non-medical: Not on file  Tobacco Use  . Smoking status: Never Smoker  . Smokeless tobacco: Never Used  Substance and Sexual Activity  . Alcohol use: No    Comment: Occasional beer  . Drug use: No  . Sexual activity: Yes    Comment: married  Lifestyle  . Physical activity:    Days per week: Not on file    Minutes per session: Not on file  . Stress: Not on file  Relationships  . Social connections:    Talks on phone: Not on file     Gets together: Not on file    Attends religious service: Not on file    Active member of club or organization: Not on file    Attends meetings of clubs or organizations: Not on file    Relationship status: Not on file  . Intimate partner violence:    Fear of current or ex partner: Not on file    Emotionally abused: Not on file    Physically abused: Not on file    Forced sexual activity: Not on file  Other Topics Concern  . Not on file  Social History Narrative  . Not on file    FAMILY HISTORY:  Family History  Problem Relation Age of Onset  . Cancer Cousin        living brain cancer, male  . Cancer Mother        Deceased with leukemia, had uterine ca  . Hyperlipidemia Brother     CURRENT MEDICATIONS:  Outpatient Encounter Medications as of 12/09/2017  Medication Sig Note  . acetaminophen (TYLENOL) 500 MG tablet Take 500-1,000 mg by mouth every 4 (four) hours as needed for mild pain. Reported on 12/27/2014   . ALPRAZolam (XANAX) 1 MG tablet    . citalopram (CELEXA) 40 MG tablet Take 1 tablet by mouth daily.   [name] oxyCODONE-acetaminophen (PERCOCET) 10-325 MG tablet Take 1 tablet by mouth every 8 (eight) hours as needed.    . tamsulosin (FLOMAX) 0.4 MG CAPS capsule    . [DISCONTINUED] alprazolam (XANAX) 2 MG tablet Take 2 mg by mouth 3 (three) times daily as needed for sleep or anxiety.  04/04/2013: 530 am   1/2  tab   No facility-administered encounter medications on file as of 12/09/2017.     ALLERGIES:  Allergies  Allergen Reactions  . Neurontin [Gabapentin] Other (See Comments)    Causes seizures  . Buspirone     Lips swelling   . Dilaudid [Hydromorphone Hcl] Other (See Comments)    Oxygen saturation drops     PHYSICAL EXAM:  ECOG Performance status: 1  Vitals:   12/09/17 1351  BP: 125/83  Pulse: 68  Resp: 20  Temp: 97.6 F (36.4 C)  SpO2: 100%   Filed Weights   12/09/17 1351  Weight: 205 lb (93 kg)    Physical Exam  Constitutional: He is  oriented to person, place, and  time. He appears well-developed and well-nourished.  Musculoskeletal: Normal range of motion.  Neurological: He is alert and oriented to person, place, and time.  Skin: Skin is warm and dry.  Psychiatric: He has a normal mood and affect. His behavior is normal. Judgment and thought content normal.     LABORATORY DATA:  I have reviewed the labs as listed.  CBC    Component Value Date/Time   WBC 4.8 12/08/2017 1140   RBC 4.66 12/08/2017 1140   HGB 14.3 12/08/2017 1140   HGB 13.9 12/16/2013 1304   HCT 45.0 12/08/2017 1140   HCT 42.4 12/16/2013 1304   PLT 163 12/08/2017 1140   PLT 167 12/16/2013 1304   MCV 96.6 12/08/2017 1140   MCV 91.0 12/16/2013 1304   MCH 30.7 12/08/2017 1140   MCHC 31.8 12/08/2017 1140   RDW 14.5 12/08/2017 1140   RDW 15.3 (H) 12/16/2013 1304   LYMPHSABS 0.7 12/08/2017 1140   LYMPHSABS 0.6 (L) 12/16/2013 1304   MONOABS 0.5 12/08/2017 1140   MONOABS 0.6 12/16/2013 1304   EOSABS 0.2 12/08/2017 1140   EOSABS 0.2 12/16/2013 1304   BASOSABS 0.1 12/08/2017 1140   BASOSABS 0.1 12/16/2013 1304   CMP Latest Ref Rng & Units 12/08/2017 11/17/2017 05/20/2017  Glucose 70 - 99 mg/dL 87 88 96  BUN 8 - 23 mg/dL 13 14 13   Creatinine 0.61 - 1.24 mg/dL 1.01 0.73 0.72  Sodium 135 - 145 mmol/L 139 136 139  Potassium 3.5 - 5.1 mmol/L 4.9 3.8 4.0  Chloride 98 - 111 mmol/L 103 103 102  CO2 22 - 32 mmol/L 28 26 28   Calcium 8.9 - 10.3 mg/dL 9.3 8.9 9.3  Total Protein 6.5 - 8.1 g/dL 7.8 7.5 7.9  Total Bilirubin 0.3 - 1.2 mg/dL 0.8 0.9 0.9  Alkaline Phos 38 - 126 U/L 55 48 54  AST 15 - 41 U/L 21 20 23   ALT 0 - 44 U/L 15 14 18        DIAGNOSTIC IMAGING:  I have independently reviewed the scans and discussed with the patient.  I have reviewed Francene Finders, NP's note and agree with the documentation.  I personally performed a face-to-face visit, made revisions and my assessment and plan is as follows.     ASSESSMENT & PLAN:   Tonsil  cancer 1.  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 - Does not report any dysphagia.  He is able to eat all sorts of foods without any problems.  Physical examination did not reveal any oropharyngeal masses.  No adenopathy is palpable. - We reviewed the results of the CT scan dated 12/08/2017 which shows stable findings in the chest, abdomen and pelvis without evidence of metastatic disease.   - His TSH was 0.8.  We plan to repeat it in 6 months.  I plan to repeat scans in a year. -He will keep his right chest wall port until 5 years.  He may have it flushed once every 2 to 3 months.  2.  Peripheral neuropathy: - He does have peripheral neuropathy mostly at nighttime in the feet.  He is taking Percocet which helps with the pain.  Orders placed this encounter:  Orders Placed This Encounter  Procedures  . Lactate dehydrogenase  . TSH  . Magnesium  . CBC with Differential/Platelet  . Comprehensive metabolic panel      Derek Jack, MD [facility] [phone]

## 2017-12-09 NOTE — Patient Instructions (Signed)
[facility] at [facility] Discharge Instructions  Follow up in 6 months with labs    Thank you for choosing [facility] at [facility] to provide your oncology and hematology care.  To afford each patient quality time with our provider, please arrive at least 15 minutes before your scheduled appointment time.   If you have a lab appointment with the Cancer Center please come in thru the  Main Entrance and check in at the main information desk  You need to re-schedule your appointment should you arrive 10 or more minutes late.  We strive to give you quality time with our providers, and arriving late affects you and other patients whose appointments are after yours.  Also, if you no show three or more times for appointments you may be dismissed from the clinic at the providers discretion.     Again, thank you for choosing [facility].  Our hope is that these requests will decrease the amount of time that you wait before being seen by our physicians.       _____________________________________________________________  Should you have questions after your visit to [facility], please contact our office at [phone] between the hours of 8:00 a.m. and 4:30 p.m.  Voicemails left after 4:00 p.m. will not be returned until the following business day.  For prescription refill requests, have your pharmacy contact our office and allow 72 hours.    Cancer Center Support Programs:   > Cancer Support Group  2nd Tuesday of the month 1pm-2pm, Journey Room    

## 2017-12-13 DIAGNOSIS — Z1211 Encounter for screening for malignant neoplasm of colon: Secondary | ICD-10-CM | POA: Diagnosis not present

## 2017-12-31 DIAGNOSIS — Z6826 Body mass index (BMI) 26.0-26.9, adult: Secondary | ICD-10-CM | POA: Diagnosis not present

## 2017-12-31 DIAGNOSIS — E663 Overweight: Secondary | ICD-10-CM | POA: Diagnosis not present

## 2017-12-31 DIAGNOSIS — G894 Chronic pain syndrome: Secondary | ICD-10-CM | POA: Diagnosis not present

## 2017-12-31 DIAGNOSIS — F419 Anxiety disorder, unspecified: Secondary | ICD-10-CM | POA: Diagnosis not present

## 2018-01-14 ENCOUNTER — Encounter (HOSPITAL_COMMUNITY): Payer: Self-pay | Admitting: Emergency Medicine

## 2018-01-14 ENCOUNTER — Other Ambulatory Visit: Payer: Self-pay

## 2018-01-14 DIAGNOSIS — Z888 Allergy status to other drugs, medicaments and biological substances status: Secondary | ICD-10-CM

## 2018-01-14 DIAGNOSIS — S0101XA Laceration without foreign body of scalp, initial encounter: Secondary | ICD-10-CM | POA: Diagnosis not present

## 2018-01-14 DIAGNOSIS — G92 Toxic encephalopathy: Principal | ICD-10-CM | POA: Diagnosis present

## 2018-01-14 DIAGNOSIS — R52 Pain, unspecified: Secondary | ICD-10-CM | POA: Diagnosis not present

## 2018-01-14 DIAGNOSIS — Z96641 Presence of right artificial hip joint: Secondary | ICD-10-CM | POA: Diagnosis present

## 2018-01-14 DIAGNOSIS — F19239 Other psychoactive substance dependence with withdrawal, unspecified: Secondary | ICD-10-CM | POA: Diagnosis present

## 2018-01-14 DIAGNOSIS — E785 Hyperlipidemia, unspecified: Secondary | ICD-10-CM | POA: Diagnosis present

## 2018-01-14 DIAGNOSIS — R4182 Altered mental status, unspecified: Secondary | ICD-10-CM | POA: Diagnosis not present

## 2018-01-14 DIAGNOSIS — E860 Dehydration: Secondary | ICD-10-CM | POA: Diagnosis present

## 2018-01-14 DIAGNOSIS — F209 Schizophrenia, unspecified: Secondary | ICD-10-CM | POA: Diagnosis present

## 2018-01-14 DIAGNOSIS — I10 Essential (primary) hypertension: Secondary | ICD-10-CM | POA: Diagnosis not present

## 2018-01-14 DIAGNOSIS — R457 State of emotional shock and stress, unspecified: Secondary | ICD-10-CM | POA: Diagnosis not present

## 2018-01-14 DIAGNOSIS — G894 Chronic pain syndrome: Secondary | ICD-10-CM | POA: Diagnosis present

## 2018-01-14 DIAGNOSIS — R339 Retention of urine, unspecified: Secondary | ICD-10-CM | POA: Diagnosis present

## 2018-01-14 DIAGNOSIS — R9431 Abnormal electrocardiogram [ECG] [EKG]: Secondary | ICD-10-CM | POA: Diagnosis not present

## 2018-01-14 DIAGNOSIS — Z79899 Other long term (current) drug therapy: Secondary | ICD-10-CM

## 2018-01-14 DIAGNOSIS — Y92009 Unspecified place in unspecified non-institutional (private) residence as the place of occurrence of the external cause: Secondary | ICD-10-CM

## 2018-01-14 DIAGNOSIS — R410 Disorientation, unspecified: Secondary | ICD-10-CM | POA: Diagnosis not present

## 2018-01-14 DIAGNOSIS — F0390 Unspecified dementia without behavioral disturbance: Secondary | ICD-10-CM | POA: Diagnosis present

## 2018-01-14 DIAGNOSIS — Z885 Allergy status to narcotic agent status: Secondary | ICD-10-CM

## 2018-01-14 DIAGNOSIS — Z23 Encounter for immunization: Secondary | ICD-10-CM

## 2018-01-14 DIAGNOSIS — Z85818 Personal history of malignant neoplasm of other sites of lip, oral cavity, and pharynx: Secondary | ICD-10-CM

## 2018-01-14 DIAGNOSIS — S299XXA Unspecified injury of thorax, initial encounter: Secondary | ICD-10-CM | POA: Diagnosis not present

## 2018-01-14 DIAGNOSIS — G629 Polyneuropathy, unspecified: Secondary | ICD-10-CM | POA: Diagnosis present

## 2018-01-14 DIAGNOSIS — F329 Major depressive disorder, single episode, unspecified: Secondary | ICD-10-CM | POA: Diagnosis present

## 2018-01-14 DIAGNOSIS — L98419 Non-pressure chronic ulcer of buttock with unspecified severity: Secondary | ICD-10-CM | POA: Diagnosis present

## 2018-01-14 DIAGNOSIS — W1830XA Fall on same level, unspecified, initial encounter: Secondary | ICD-10-CM | POA: Diagnosis present

## 2018-01-14 DIAGNOSIS — Z8349 Family history of other endocrine, nutritional and metabolic diseases: Secondary | ICD-10-CM

## 2018-01-14 DIAGNOSIS — S0990XA Unspecified injury of head, initial encounter: Secondary | ICD-10-CM | POA: Diagnosis not present

## 2018-01-14 DIAGNOSIS — E119 Type 2 diabetes mellitus without complications: Secondary | ICD-10-CM | POA: Diagnosis present

## 2018-01-14 DIAGNOSIS — N472 Paraphimosis: Secondary | ICD-10-CM | POA: Diagnosis present

## 2018-01-14 DIAGNOSIS — F05 Delirium due to known physiological condition: Secondary | ICD-10-CM | POA: Diagnosis present

## 2018-01-14 DIAGNOSIS — N35919 Unspecified urethral stricture, male, unspecified site: Secondary | ICD-10-CM | POA: Diagnosis present
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
## 2018-01-15 ENCOUNTER — Observation Stay (HOSPITAL_COMMUNITY): Payer: Medicare Other

## 2018-01-15 ENCOUNTER — Emergency Department (HOSPITAL_COMMUNITY): Payer: Medicare Other

## 2018-01-15 ENCOUNTER — Inpatient Hospital Stay (HOSPITAL_COMMUNITY)
Admission: EM | Admit: 2018-01-15 | Discharge: 2018-01-19 | DRG: 092 | Disposition: A | Discharge status: Skilled Nursing Facility | Payer: Medicare Other | Attending: Internal Medicine | Admitting: Internal Medicine

## 2018-01-15 DIAGNOSIS — G9341 Metabolic encephalopathy: Secondary | ICD-10-CM | POA: Diagnosis present

## 2018-01-15 DIAGNOSIS — S0101XA Laceration without foreign body of scalp, initial encounter: Secondary | ICD-10-CM | POA: Diagnosis not present

## 2018-01-15 DIAGNOSIS — S0990XA Unspecified injury of head, initial encounter: Secondary | ICD-10-CM

## 2018-01-15 DIAGNOSIS — S299XXA Unspecified injury of thorax, initial encounter: Secondary | ICD-10-CM | POA: Diagnosis not present

## 2018-01-15 DIAGNOSIS — R338 Other retention of urine: Secondary | ICD-10-CM | POA: Diagnosis not present

## 2018-01-15 DIAGNOSIS — R410 Disorientation, unspecified: Secondary | ICD-10-CM | POA: Diagnosis not present

## 2018-01-15 DIAGNOSIS — R34 Anuria and oliguria: Secondary | ICD-10-CM

## 2018-01-15 DIAGNOSIS — E860 Dehydration: Secondary | ICD-10-CM

## 2018-01-15 DIAGNOSIS — G894 Chronic pain syndrome: Secondary | ICD-10-CM

## 2018-01-15 LAB — CBC WITH DIFFERENTIAL/PLATELET
Abs Immature Granulocytes: 0.04 10*3/uL (ref 0.00–0.07)
BASOS PCT: 0 %
Basophils Absolute: 0 10*3/uL (ref 0.0–0.1)
Eosinophils Absolute: 0 10*3/uL (ref 0.0–0.5)
Eosinophils Relative: 0 %
HCT: 46.6 % (ref 39.0–52.0)
Hemoglobin: 15 g/dL (ref 13.0–17.0)
Immature Granulocytes: 0 %
Lymphocytes Relative: 7 %
Lymphs Abs: 0.8 10*3/uL (ref 0.7–4.0)
MCH: 30.5 pg (ref 26.0–34.0)
MCHC: 32.2 g/dL (ref 30.0–36.0)
MCV: 94.9 fL (ref 80.0–100.0)
Monocytes Absolute: 0.6 10*3/uL (ref 0.1–1.0)
Monocytes Relative: 6 %
NRBC: 0 % (ref 0.0–0.2)
Neutro Abs: 9.7 10*3/uL — ABNORMAL HIGH (ref 1.7–7.7)
Neutrophils Relative %: 87 %
Platelets: 219 10*3/uL (ref 150–400)
RBC: 4.91 MIL/uL (ref 4.22–5.81)
RDW: 14.6 % (ref 11.5–15.5)
WBC: 11.3 10*3/uL — ABNORMAL HIGH (ref 4.0–10.5)

## 2018-01-15 LAB — BASIC METABOLIC PANEL
Anion gap: 14 (ref 5–15)
BUN: 17 mg/dL (ref 8–23)
CO2: 23 mmol/L (ref 22–32)
Calcium: 9.8 mg/dL (ref 8.9–10.3)
Chloride: 100 mmol/L (ref 98–111)
Creatinine, Ser: 1.25 mg/dL — ABNORMAL HIGH (ref 0.61–1.24)
GFR calc non Af Amer: 59 mL/min — ABNORMAL LOW (ref >60)
Glucose, Bld: 118 mg/dL — ABNORMAL HIGH (ref 70–99)
Potassium: 3.5 mmol/L (ref 3.5–5.1)
Sodium: 137 mmol/L (ref 135–145)

## 2018-01-15 LAB — URINALYSIS, ROUTINE W REFLEX MICROSCOPIC
Bacteria, UA: NONE SEEN
Bilirubin Urine: NEGATIVE
Glucose, UA: 50 mg/dL — AB
Ketones, ur: 20 mg/dL — AB
Leukocytes, UA: NEGATIVE
Nitrite: NEGATIVE
Protein, ur: NEGATIVE mg/dL
SPECIFIC GRAVITY, URINE: 1.015 (ref 1.005–1.030)
pH: 6 (ref 5.0–8.0)

## 2018-01-15 LAB — FOLATE: Folate: 23.5 ng/mL (ref >5.9)

## 2018-01-15 LAB — T4, FREE: Free T4: 1.22 ng/dL (ref 0.82–1.77)

## 2018-01-15 LAB — AMMONIA: Ammonia: 24 umol/L (ref 9–35)

## 2018-01-15 LAB — VITAMIN B12: Vitamin B-12: 571 pg/mL (ref 180–914)

## 2018-01-15 MED ORDER — OXYCODONE-ACETAMINOPHEN 10-325 MG PO TABS: 1.0000 | ORAL_TABLET | Freq: Three times a day (TID) | ORAL | Status: DC | PRN

## 2018-01-15 MED ORDER — SODIUM CHLORIDE 0.9 % IV BOLUS (SEPSIS)
1000.0000 mL | Freq: Once | INTRAVENOUS | Status: AC
  Administered 2018-01-15: 1000 mL via INTRAVENOUS

## 2018-01-15 MED ORDER — ONDANSETRON HCL 4 MG/2ML IJ SOLN: 4.0000 mg | Freq: Four times a day (QID) | INTRAMUSCULAR | Status: DC | PRN

## 2018-01-15 MED ORDER — CITALOPRAM HYDROBROMIDE 20 MG PO TABS
40.0000 mg | ORAL_TABLET | Freq: Every day | ORAL | Status: DC
  Administered 2018-01-15 – 2018-01-16 (×2): 40 mg via ORAL
  Filled 2018-01-15 (×4): qty 1
  Filled 2018-01-15: qty 2

## 2018-01-15 MED ORDER — ACETAMINOPHEN 650 MG RE SUPP: 650.0000 mg | Freq: Four times a day (QID) | RECTAL | Status: DC | PRN

## 2018-01-15 MED ORDER — OXYCODONE-ACETAMINOPHEN 5-325 MG PO TABS
ORAL_TABLET | ORAL | Status: AC
  Filled 2018-01-15: qty 1

## 2018-01-15 MED ORDER — OXYCODONE HCL 5 MG PO TABS: 5.0000 mg | ORAL_TABLET | Freq: Three times a day (TID) | ORAL | Status: DC | PRN

## 2018-01-15 MED ORDER — ACETAMINOPHEN 325 MG PO TABS
650.0000 mg | ORAL_TABLET | Freq: Four times a day (QID) | ORAL | Status: DC | PRN
  Administered 2018-01-16: 650 mg via ORAL
  Filled 2018-01-15: qty 2

## 2018-01-15 MED ORDER — LIDOCAINE-EPINEPHRINE-TETRACAINE (LET) SOLUTION
3.0000 mL | Freq: Once | NASAL | Status: AC
  Administered 2018-01-15: 3 mL via TOPICAL
  Filled 2018-01-15: qty 3

## 2018-01-15 MED ORDER — ENOXAPARIN SODIUM 40 MG/0.4ML ~~LOC~~ SOLN
40.0000 mg | SUBCUTANEOUS | Status: DC
  Administered 2018-01-15 – 2018-01-19 (×5): 40 mg via SUBCUTANEOUS
  Filled 2018-01-15 (×5): qty 0.4

## 2018-01-15 MED ORDER — OXYCODONE-ACETAMINOPHEN 5-325 MG PO TABS
1.0000 | ORAL_TABLET | Freq: Three times a day (TID) | ORAL | Status: DC | PRN
  Administered 2018-01-15: 1 via ORAL
  Filled 2018-01-15: qty 1

## 2018-01-15 MED ORDER — POTASSIUM CHLORIDE IN NACL 20-0.9 MEQ/L-% IV SOLN
INTRAVENOUS | Status: DC
  Administered 2018-01-15 – 2018-01-16 (×2): via INTRAVENOUS
  Filled 2018-01-15: qty 1000

## 2018-01-15 MED ORDER — TAMSULOSIN HCL 0.4 MG PO CAPS
0.4000 mg | ORAL_CAPSULE | Freq: Every day | ORAL | Status: DC
  Administered 2018-01-15 – 2018-01-19 (×5): 0.4 mg via ORAL
  Filled 2018-01-15 (×5): qty 1

## 2018-01-15 MED ORDER — TETANUS-DIPHTH-ACELL PERTUSSIS 5-2.5-18.5 LF-MCG/0.5 IM SUSP
0.5000 mL | Freq: Once | INTRAMUSCULAR | Status: AC
  Administered 2018-01-15: 0.5 mL via INTRAMUSCULAR
  Filled 2018-01-15: qty 0.5

## 2018-01-15 MED ORDER — ONDANSETRON HCL 4 MG PO TABS: 4.0000 mg | ORAL_TABLET | Freq: Four times a day (QID) | ORAL | Status: DC | PRN

## 2018-01-15 NOTE — ED Notes (Signed)
Given snack and drink

## 2018-01-15 NOTE — ED Notes (Signed)
Bladder scanner read >999

## 2018-01-15 NOTE — ED Notes (Signed)
Patient denies pain and is resting comfortably.  

## 2018-01-15 NOTE — ED Notes (Addendum)
Pt talking in room. States that he is "just talking to myself" pt appears to be looking at the ceiling and grabbing something from the air. Denies hallucinations. Saying he is reaching for something edp notified

## 2018-01-15 NOTE — ED Notes (Signed)
Urine specimen printed however pt unable to void at this time

## 2018-01-15 NOTE — ED Notes (Signed)
Pt called out and request pain medication.  Took percocet to pt. Ask pt where he is having pain.  Pt states "im not in pain."  Pt states he just takes the pain pills to be taking them.  Educated pt about using pain pills frequently and risk for addiction

## 2018-01-15 NOTE — ED Notes (Signed)
Given meal tray.
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
## 2018-01-15 NOTE — ED Notes (Signed)
Pt was ambulating around unit when pt became unsteady and fell after leaning against door way. Pt assisted up and in bed. Educated on how to prevent falls. edp notified. NAD

## 2018-01-15 NOTE — ED Notes (Signed)
Bladder scan revealed 999+ml edp notified

## 2018-01-15 NOTE — ED Notes (Addendum)
Pt walking around unit. "I need to stretch" per pt

## 2018-01-15 NOTE — ED Notes (Signed)
Pt bed remade, dimmed lights.  Pt states he is going to take a nap.

## 2018-01-15 NOTE — ED Notes (Signed)
Granddaughter, Caralyn Guile left her # as a contact if needed, [phone].
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
## 2018-01-16 DIAGNOSIS — R410 Disorientation, unspecified: Secondary | ICD-10-CM | POA: Diagnosis not present

## 2018-01-16 DIAGNOSIS — E860 Dehydration: Secondary | ICD-10-CM | POA: Diagnosis not present

## 2018-01-16 DIAGNOSIS — G9341 Metabolic encephalopathy: Secondary | ICD-10-CM | POA: Diagnosis not present

## 2018-01-16 DIAGNOSIS — S0101XA Laceration without foreign body of scalp, initial encounter: Secondary | ICD-10-CM | POA: Diagnosis not present

## 2018-01-16 DIAGNOSIS — G894 Chronic pain syndrome: Secondary | ICD-10-CM | POA: Diagnosis not present

## 2018-01-16 DIAGNOSIS — R338 Other retention of urine: Secondary | ICD-10-CM | POA: Diagnosis not present

## 2018-01-16 LAB — COMPREHENSIVE METABOLIC PANEL
ALT: 15 U/L (ref 0–44)
AST: 31 U/L (ref 15–41)
Albumin: 4 g/dL (ref 3.5–5.0)
Alkaline Phosphatase: 49 U/L (ref 38–126)
Anion gap: 10 (ref 5–15)
BUN: 20 mg/dL (ref 8–23)
CO2: 23 mmol/L (ref 22–32)
Calcium: 8.9 mg/dL (ref 8.9–10.3)
Chloride: 105 mmol/L (ref 98–111)
Creatinine, Ser: 0.83 mg/dL (ref 0.61–1.24)
GFR calc Af Amer: >60 mL/min (ref >60)
GFR calc non Af Amer: >60 mL/min (ref >60)
Glucose, Bld: 105 mg/dL — ABNORMAL HIGH (ref 70–99)
Potassium: 3.7 mmol/L (ref 3.5–5.1)
Sodium: 138 mmol/L (ref 135–145)
Total Bilirubin: 2.1 mg/dL — ABNORMAL HIGH (ref 0.3–1.2)
Total Protein: 7.5 g/dL (ref 6.5–8.1)

## 2018-01-16 LAB — HIV ANTIBODY (ROUTINE TESTING W REFLEX): HIV Screen 4th Generation wRfx: NONREACTIVE

## 2018-01-16 LAB — CBC
HCT: 40.7 % (ref 39.0–52.0)
Hemoglobin: 13.3 g/dL (ref 13.0–17.0)
MCH: 30.8 pg (ref 26.0–34.0)
MCHC: 32.7 g/dL (ref 30.0–36.0)
MCV: 94.2 fL (ref 80.0–100.0)
Platelets: 195 10*3/uL (ref 150–400)
RBC: 4.32 MIL/uL (ref 4.22–5.81)
RDW: 14.3 % (ref 11.5–15.5)
WBC: 9.5 10*3/uL (ref 4.0–10.5)
nRBC: 0 % (ref 0.0–0.2)

## 2018-01-16 LAB — MRSA PCR SCREENING: MRSA by PCR: NEGATIVE

## 2018-01-16 LAB — RPR: RPR Ser Ql: NONREACTIVE

## 2018-01-16 MED ORDER — ALPRAZOLAM 0.5 MG PO TABS
0.5000 mg | ORAL_TABLET | Freq: Three times a day (TID) | ORAL | Status: DC | PRN
  Administered 2018-01-16: 0.5 mg via ORAL
  Filled 2018-01-16: qty 1

## 2018-01-16 MED ORDER — SODIUM CHLORIDE 0.9% FLUSH
3.0000 mL | Freq: Two times a day (BID) | INTRAVENOUS | Status: DC
  Administered 2018-01-16 – 2018-01-19 (×6): 3 mL via INTRAVENOUS

## 2018-01-16 MED ORDER — ALPRAZOLAM 1 MG PO TABS
1.0000 mg | ORAL_TABLET | Freq: Three times a day (TID) | ORAL | Status: DC
  Administered 2018-01-16 – 2018-01-19 (×9): 1 mg via ORAL
  Filled 2018-01-16 (×9): qty 1

## 2018-01-16 MED ORDER — SODIUM CHLORIDE 0.9% FLUSH: 3.0000 mL | INTRAVENOUS | Status: DC | PRN

## 2018-01-16 MED ORDER — CITALOPRAM HYDROBROMIDE 20 MG PO TABS
20.0000 mg | ORAL_TABLET | Freq: Every day | ORAL | Status: DC
  Administered 2018-01-17 – 2018-01-19 (×3): 20 mg via ORAL
  Filled 2018-01-16 (×3): qty 1

## 2018-01-16 MED ORDER — SODIUM CHLORIDE 0.9 % IV SOLN: 250.0000 mL | INTRAVENOUS | Status: DC | PRN

## 2018-01-16 MED ORDER — OLANZAPINE 5 MG PO TABS
5.0000 mg | ORAL_TABLET | Freq: Every day | ORAL | Status: DC
  Administered 2018-01-16 – 2018-01-18 (×3): 5 mg via ORAL
  Filled 2018-01-16 (×3): qty 1

## 2018-01-16 MED ORDER — HALOPERIDOL LACTATE 5 MG/ML IJ SOLN
5.0000 mg | Freq: Four times a day (QID) | INTRAMUSCULAR | Status: DC | PRN
  Administered 2018-01-16 (×2): 5 mg via INTRAVENOUS
  Filled 2018-01-16 (×2): qty 1
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
## 2018-01-16 NOTE — Progress Notes (Signed)
Pt has been hallucinating and is determined to go to the parking lot.  Very difficult to redirect.  He has pulled his Foley completely out, with significant bleeding and swelling.  Haldol 5mg  IV given.  Pt is currently awake and calmly lying in bed, talking to people who are not here.  Bleeding is controlled.

## 2018-01-16 NOTE — Progress Notes (Signed)
Pt repeatedly pulling on Foley and attempting to get out of bed.  Penis is swollen and bleeding.  Safety mitts applied and fall mats are at bedside.  Pt is alert and oriented with intermittent confusion.

## 2018-01-17 DIAGNOSIS — G9341 Metabolic encephalopathy: Secondary | ICD-10-CM | POA: Diagnosis not present

## 2018-01-17 DIAGNOSIS — R338 Other retention of urine: Secondary | ICD-10-CM

## 2018-01-17 DIAGNOSIS — R410 Disorientation, unspecified: Secondary | ICD-10-CM | POA: Diagnosis not present

## 2018-01-17 DIAGNOSIS — E860 Dehydration: Secondary | ICD-10-CM | POA: Diagnosis not present

## 2018-01-17 DIAGNOSIS — G894 Chronic pain syndrome: Secondary | ICD-10-CM | POA: Diagnosis not present

## 2018-01-17 LAB — COMPREHENSIVE METABOLIC PANEL
ALT: 12 U/L (ref 0–44)
AST: 22 U/L (ref 15–41)
Albumin: 3.7 g/dL (ref 3.5–5.0)
Alkaline Phosphatase: 44 U/L (ref 38–126)
Anion gap: 7 (ref 5–15)
BUN: 24 mg/dL — ABNORMAL HIGH (ref 8–23)
CO2: 25 mmol/L (ref 22–32)
Calcium: 8.6 mg/dL — ABNORMAL LOW (ref 8.9–10.3)
Chloride: 106 mmol/L (ref 98–111)
Creatinine, Ser: 0.75 mg/dL (ref 0.61–1.24)
GFR calc non Af Amer: >60 mL/min (ref >60)
Glucose, Bld: 109 mg/dL — ABNORMAL HIGH (ref 70–99)
POTASSIUM: 3.1 mmol/L — AB (ref 3.5–5.1)
Sodium: 138 mmol/L (ref 135–145)
Total Bilirubin: 1.7 mg/dL — ABNORMAL HIGH (ref 0.3–1.2)
Total Protein: 6.7 g/dL (ref 6.5–8.1)

## 2018-01-17 LAB — CBC
HCT: 34.7 % — ABNORMAL LOW (ref 39.0–52.0)
HEMOGLOBIN: 11.3 g/dL — AB (ref 13.0–17.0)
MCH: 31.5 pg (ref 26.0–34.0)
MCHC: 32.6 g/dL (ref 30.0–36.0)
MCV: 96.7 fL (ref 80.0–100.0)
Platelets: 165 10*3/uL (ref 150–400)
RBC: 3.59 MIL/uL — ABNORMAL LOW (ref 4.22–5.81)
RDW: 14.3 % (ref 11.5–15.5)
WBC: 13 10*3/uL — ABNORMAL HIGH (ref 4.0–10.5)
nRBC: 0 % (ref 0.0–0.2)

## 2018-01-17 LAB — MAGNESIUM: Magnesium: 2.5 mg/dL — ABNORMAL HIGH (ref 1.7–2.4)

## 2018-01-17 LAB — CK: Total CK: 132 U/L (ref 49–397)

## 2018-01-17 MED ORDER — LORAZEPAM 2 MG/ML IJ SOLN
1.0000 mg | Freq: Once | INTRAMUSCULAR | Status: AC
  Administered 2018-01-17: 1 mg via INTRAVENOUS
  Filled 2018-01-17: qty 1

## 2018-01-17 MED ORDER — POTASSIUM CHLORIDE IN NACL 20-0.9 MEQ/L-% IV SOLN
INTRAVENOUS | Status: DC
  Administered 2018-01-17: 20:00:00 via INTRAVENOUS

## 2018-01-17 MED ORDER — POTASSIUM CHLORIDE CRYS ER 20 MEQ PO TBCR
20.0000 meq | EXTENDED_RELEASE_TABLET | Freq: Once | ORAL | Status: AC
  Administered 2018-01-17: 20 meq via ORAL
  Filled 2018-01-17: qty 1

## 2018-01-17 MED ORDER — POTASSIUM CHLORIDE CRYS ER 20 MEQ PO TBCR: 40.0000 meq | EXTENDED_RELEASE_TABLET | Freq: Once | ORAL | Status: DC
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
## 2018-01-17 NOTE — Progress Notes (Signed)
Late entry: 18:00 - Foley irrigated with 393ml sterile water.  Pt reported feeling pressure of bladder filling.  Very little return.  Dr. Carles Collet notified.

## 2018-01-17 NOTE — Progress Notes (Signed)
Patient has not had any urine output from foley. Bladder scanned patient bladder scanner showed greater than 200 in bladder. Paged MD. MD gave verbal order with read back to reinsert foley. Removed foley and inserted a 16 french foley. No urine output when foley was advanced. MD made aware. Md said to leave foley in place and pass along to day shift. Will continue to monitor throughout shift.

## 2018-01-17 NOTE — Progress Notes (Signed)
Patient reporting pressure in abdomen. No output this shift. Bloody drainage in catheter tubing. Bladder scan showing 119 ml earlier this shift. Mid Level notified. Order placed to irrigate foley catheter. Will continue to monitor.

## 2018-01-17 NOTE — Progress Notes (Signed)
PT Cancellation Note  Patient Details Name: [name] MRN: [id number] DOB: 23-Nov-1950   Cancelled Treatment:     Therapist called nurse working with pt as via notes pt did not seen to be appropriate for imminent discharge.  Nurse states they were not able to keep pt in the bed, he kept getting out of bed on his own therefore they had to sedate him in the middle of the night and he was medically sedated at this time.  Pt will be seen tomorrow.   Rayetta Humphrey, PT CLT [phone] 01/17/2018, 9:23 AM

## 2018-01-18 ENCOUNTER — Observation Stay (HOSPITAL_COMMUNITY): Payer: Medicare Other

## 2018-01-18 ENCOUNTER — Encounter (HOSPITAL_COMMUNITY): Payer: Self-pay | Admitting: Urology

## 2018-01-18 DIAGNOSIS — R34 Anuria and oliguria: Secondary | ICD-10-CM

## 2018-01-18 DIAGNOSIS — Z888 Allergy status to other drugs, medicaments and biological substances status: Secondary | ICD-10-CM | POA: Diagnosis not present

## 2018-01-18 DIAGNOSIS — Z23 Encounter for immunization: Secondary | ICD-10-CM | POA: Diagnosis not present

## 2018-01-18 DIAGNOSIS — Z79899 Other long term (current) drug therapy: Secondary | ICD-10-CM | POA: Diagnosis not present

## 2018-01-18 DIAGNOSIS — F419 Anxiety disorder, unspecified: Secondary | ICD-10-CM | POA: Diagnosis not present

## 2018-01-18 DIAGNOSIS — R339 Retention of urine, unspecified: Secondary | ICD-10-CM | POA: Diagnosis not present

## 2018-01-18 DIAGNOSIS — R404 Transient alteration of awareness: Secondary | ICD-10-CM | POA: Diagnosis not present

## 2018-01-18 DIAGNOSIS — M6281 Muscle weakness (generalized): Secondary | ICD-10-CM | POA: Diagnosis not present

## 2018-01-18 DIAGNOSIS — G92 Toxic encephalopathy: Secondary | ICD-10-CM | POA: Diagnosis not present

## 2018-01-18 DIAGNOSIS — E860 Dehydration: Secondary | ICD-10-CM | POA: Diagnosis not present

## 2018-01-18 DIAGNOSIS — Z7401 Bed confinement status: Secondary | ICD-10-CM | POA: Diagnosis not present

## 2018-01-18 DIAGNOSIS — R338 Other retention of urine: Secondary | ICD-10-CM | POA: Diagnosis not present

## 2018-01-18 DIAGNOSIS — L98419 Non-pressure chronic ulcer of buttock with unspecified severity: Secondary | ICD-10-CM | POA: Diagnosis not present

## 2018-01-18 DIAGNOSIS — G894 Chronic pain syndrome: Secondary | ICD-10-CM | POA: Diagnosis not present

## 2018-01-18 DIAGNOSIS — Y92009 Unspecified place in unspecified non-institutional (private) residence as the place of occurrence of the external cause: Secondary | ICD-10-CM | POA: Diagnosis not present

## 2018-01-18 DIAGNOSIS — R1312 Dysphagia, oropharyngeal phase: Secondary | ICD-10-CM | POA: Diagnosis not present

## 2018-01-18 DIAGNOSIS — L89309 Pressure ulcer of unspecified buttock, unspecified stage: Secondary | ICD-10-CM | POA: Diagnosis not present

## 2018-01-18 DIAGNOSIS — F209 Schizophrenia, unspecified: Secondary | ICD-10-CM | POA: Diagnosis present

## 2018-01-18 DIAGNOSIS — R410 Disorientation, unspecified: Secondary | ICD-10-CM | POA: Diagnosis not present

## 2018-01-18 DIAGNOSIS — Z85818 Personal history of malignant neoplasm of other sites of lip, oral cavity, and pharynx: Secondary | ICD-10-CM | POA: Diagnosis not present

## 2018-01-18 DIAGNOSIS — G9341 Metabolic encephalopathy: Secondary | ICD-10-CM | POA: Diagnosis not present

## 2018-01-18 DIAGNOSIS — E785 Hyperlipidemia, unspecified: Secondary | ICD-10-CM | POA: Diagnosis not present

## 2018-01-18 DIAGNOSIS — N472 Paraphimosis: Secondary | ICD-10-CM | POA: Diagnosis not present

## 2018-01-18 DIAGNOSIS — E119 Type 2 diabetes mellitus without complications: Secondary | ICD-10-CM | POA: Diagnosis not present

## 2018-01-18 DIAGNOSIS — Z885 Allergy status to narcotic agent status: Secondary | ICD-10-CM | POA: Diagnosis not present

## 2018-01-18 DIAGNOSIS — E869 Volume depletion, unspecified: Secondary | ICD-10-CM | POA: Diagnosis not present

## 2018-01-18 DIAGNOSIS — F19239 Other psychoactive substance dependence with withdrawal, unspecified: Secondary | ICD-10-CM | POA: Diagnosis not present

## 2018-01-18 DIAGNOSIS — F329 Major depressive disorder, single episode, unspecified: Secondary | ICD-10-CM | POA: Diagnosis not present

## 2018-01-18 DIAGNOSIS — G629 Polyneuropathy, unspecified: Secondary | ICD-10-CM | POA: Diagnosis not present

## 2018-01-18 DIAGNOSIS — Z8349 Family history of other endocrine, nutritional and metabolic diseases: Secondary | ICD-10-CM | POA: Diagnosis not present

## 2018-01-18 DIAGNOSIS — W1830XA Fall on same level, unspecified, initial encounter: Secondary | ICD-10-CM | POA: Diagnosis present

## 2018-01-18 DIAGNOSIS — R5381 Other malaise: Secondary | ICD-10-CM | POA: Diagnosis not present

## 2018-01-18 DIAGNOSIS — F0390 Unspecified dementia without behavioral disturbance: Secondary | ICD-10-CM | POA: Diagnosis not present

## 2018-01-18 DIAGNOSIS — F05 Delirium due to known physiological condition: Secondary | ICD-10-CM | POA: Diagnosis not present

## 2018-01-18 DIAGNOSIS — Z96641 Presence of right artificial hip joint: Secondary | ICD-10-CM | POA: Diagnosis present

## 2018-01-18 DIAGNOSIS — Z4682 Encounter for fitting and adjustment of non-vascular catheter: Secondary | ICD-10-CM | POA: Diagnosis not present

## 2018-01-18 DIAGNOSIS — N35919 Unspecified urethral stricture, male, unspecified site: Secondary | ICD-10-CM | POA: Diagnosis not present

## 2018-01-18 DIAGNOSIS — R262 Difficulty in walking, not elsewhere classified: Secondary | ICD-10-CM | POA: Diagnosis not present

## 2018-01-18 DIAGNOSIS — R531 Weakness: Secondary | ICD-10-CM | POA: Diagnosis not present

## 2018-01-18 DIAGNOSIS — S0101XA Laceration without foreign body of scalp, initial encounter: Secondary | ICD-10-CM | POA: Diagnosis not present

## 2018-01-18 DIAGNOSIS — F339 Major depressive disorder, recurrent, unspecified: Secondary | ICD-10-CM | POA: Diagnosis not present

## 2018-01-18 DIAGNOSIS — R4182 Altered mental status, unspecified: Secondary | ICD-10-CM | POA: Diagnosis not present

## 2018-01-18 LAB — CBC
HCT: 31.8 % — ABNORMAL LOW (ref 39.0–52.0)
Hemoglobin: 10 g/dL — ABNORMAL LOW (ref 13.0–17.0)
MCH: 30.6 pg (ref 26.0–34.0)
MCHC: 31.4 g/dL (ref 30.0–36.0)
MCV: 97.2 fL (ref 80.0–100.0)
Platelets: 136 10*3/uL — ABNORMAL LOW (ref 150–400)
RBC: 3.27 MIL/uL — ABNORMAL LOW (ref 4.22–5.81)
RDW: 14.6 % (ref 11.5–15.5)
WBC: 8 10*3/uL (ref 4.0–10.5)
nRBC: 0 % (ref 0.0–0.2)

## 2018-01-18 LAB — BASIC METABOLIC PANEL
Anion gap: 7 (ref 5–15)
BUN: 21 mg/dL (ref 8–23)
CO2: 26 mmol/L (ref 22–32)
CREATININE: 0.71 mg/dL (ref 0.61–1.24)
Calcium: 8.5 mg/dL — ABNORMAL LOW (ref 8.9–10.3)
Chloride: 106 mmol/L (ref 98–111)
GFR calc Af Amer: >60 mL/min (ref >60)
GFR calc non Af Amer: >60 mL/min (ref >60)
Glucose, Bld: 105 mg/dL — ABNORMAL HIGH (ref 70–99)
Potassium: 3.4 mmol/L — ABNORMAL LOW (ref 3.5–5.1)
Sodium: 139 mmol/L (ref 135–145)

## 2018-01-18 LAB — HEPATIC FUNCTION PANEL
ALT: 12 U/L (ref 0–44)
AST: 17 U/L (ref 15–41)
Albumin: 3.3 g/dL — ABNORMAL LOW (ref 3.5–5.0)
Alkaline Phosphatase: 39 U/L (ref 38–126)
BILIRUBIN INDIRECT: 0.9 mg/dL (ref 0.3–0.9)
Bilirubin, Direct: 0.1 mg/dL (ref 0.0–0.2)
Total Bilirubin: 1 mg/dL (ref 0.3–1.2)
Total Protein: 6.3 g/dL — ABNORMAL LOW (ref 6.5–8.1)

## 2018-01-18 LAB — MAGNESIUM: Magnesium: 2.3 mg/dL (ref 1.7–2.4)

## 2018-01-18 MED ORDER — SODIUM CHLORIDE 0.9 % IV BOLUS
500.0000 mL | Freq: Once | INTRAVENOUS | Status: AC
  Administered 2018-01-18: 500 mL via INTRAVENOUS

## 2018-01-18 MED ORDER — POTASSIUM CHLORIDE IN NACL 20-0.9 MEQ/L-% IV SOLN: INTRAVENOUS | Status: DC

## 2018-01-18 NOTE — Plan of Care (Signed)

## 2018-01-18 NOTE — Progress Notes (Signed)
Patient bladder scan showing >200. Denies pain or discomfort. No urine output this shift. Foley catheter irrigated with no clots visible. Dr.[name] notified. New order placed to discontinue IV fluids at this time. Will continue to monitor.

## 2018-01-18 NOTE — Consult Note (Signed)
Subjective: CC: Anuria   Hx: [name] was admitted with metobolic encephalopathy.  I was asked to see him in consultation by Dr. Carles Collet for no urine output.  He had a foley placed in the ER with initial good UOP but the patient pulled the foley out and subsequent foley replacement didn't result in return of urine even with irrigation.  A renal US done, showed 800+ ml in the bladder with some debris but the foley balloon is not seen.   The patient has had no discomfort despite the residual.  He has a history of a urethral stricture and has been followed by Dr. Karsten Ro.  He was previously on self cath but has been voiding well without the need for a cath for some time.   ROS:  Review of Systems  Constitutional: Negative for fever.  Gastrointestinal: Negative for abdominal pain.  Genitourinary:       Penile pain  All other systems reviewed and are negative.   Allergies  Allergen Reactions  . Neurontin [Gabapentin] Other (See Comments)    Causes seizures  . Buspirone     Lips swelling   . Dilaudid [Hydromorphone Hcl] Other (See Comments)    Oxygen saturation drops    Past Medical History:  Diagnosis Date  . Allergy   . Anxiety   . Arthritis    knees, HIps, Hands  . Chest pain    10/14  . Complication of anesthesia    bleeding during intubation 04/04/13 due to friability of right tonsillar cancer  . Constipation   . Depression   . Fibromyalgia   . Neuropathy    compression neuropathy right hip;s/p replacement  . PEG (percutaneous endoscopic gastrostomy) status ([place])   . Pneumonia 2012  . 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 Tonsillar cancer ([place])   . Type II diabetes mellitus ([place])   . Urethral stricture    s/p dilitation    Past Surgical History:  Procedure Laterality Date  . CYSTOSCOPY     Dr. Karsten Ro  . GASTROSTOMY TUBE PLACEMENT  04/04/2013  . LAPAROSCOPIC GASTROSTOMY N/A 04/04/2013    Procedure: LAPAROSCOPIC GASTROSTOMY TUBE PLACEMENT ;  Surgeon: Ralene Ok, MD;  Location: [facility];  Service: General;  Laterality: N/A;  . MULTIPLE EXTRACTIONS WITH ALVEOLOPLASTY N/A 04/14/2013   Procedure: Extraction of tooth #'s 1,2,3,4,5,6,7,8,9,10,11,12,13,14,15,17,18,19,20,21,22,23,24,25,26,27,28,29, 30, 31, and 32 with alveoloplasty and bilateral mandibular tori reductions.;  Surgeon: Lenn Cal, DDS;  Location: [facility];  Service: Oral Surgery;  Laterality: N/A;  . NASAL HEMORRHAGE CONTROL N/A 04/04/2013   Procedure: Control of oropharyngeal hemorrhage;  Surgeon: Ascencion Dike, MD;  Location: [facility];  Service: ENT;  Laterality: N/A;  . PORTACATH PLACEMENT Right 04/04/2013  . PORTACATH PLACEMENT N/A 04/04/2013   Procedure: INSERTION PORT-A-CATH;  Surgeon: Ralene Ok, MD;  Location: [facility];  Service: General;  Laterality: N/A;  . TOTAL HIP ARTHROPLASTY Right 2000   Dr. Percell Miller    Social History   Socioeconomic History  . Marital status: Married    Spouse name: Not on file  . Number of children: Not on file  . Years of education: Not on file  . Highest education level: Not on file  Occupational History  . Not on file  Social Needs  . Financial resource strain: Not on file  . Food insecurity:    Worry: Not on file    Inability: Not on file  . Transportation needs:  Medical: Not on file    Non-medical: Not on file  Tobacco Use  . Smoking status: Never Smoker  . Smokeless tobacco: Never Used  Substance and Sexual Activity  . Alcohol use: No    Comment: Occasional beer  . Drug use: No  . Sexual activity: Yes    Comment: married  Lifestyle  . Physical activity:    Days per week: Not on file    Minutes per session: Not on file  . Stress: Not on file  Relationships  . Social connections:    Talks on phone: Not on file    Gets together: Not on file    Attends religious service: Not on file    Active member of club or organization: Not on file    Attends meetings of  clubs or organizations: Not on file    Relationship status: Not on file  . Intimate partner violence:    Fear of current or ex partner: Not on file    Emotionally abused: Not on file    Physically abused: Not on file    Forced sexual activity: Not on file  Other Topics Concern  . Not on file  Social History Narrative  . Not on file    Family History  Problem Relation Age of Onset  . Cancer Cousin        living brain cancer, male  . Cancer Mother        Deceased with leukemia, had uterine ca  . Hyperlipidemia Brother     Anti-infectives: Anti-infectives (From admission, onward)   None      Current Facility-Administered Medications  Medication Dose Route Frequency Provider Last Rate Last Dose  . 0.9 %  sodium chloride infusion  250 mL Intravenous PRN Tat, David, MD      . acetaminophen (TYLENOL) tablet 650 mg  650 mg Oral Q6H PRN Tat, Shanon Brow, MD   650 mg at 01/16/18 0809   Or  . acetaminophen (TYLENOL) suppository 650 mg  650 mg Rectal Q6H PRN Tat, Shanon Brow, MD      . ALPRAZolam Duanne Moron) tablet 1 mg  1 mg Oral TID Orson Eva, MD   1 mg at 01/18/18 1606  . citalopram (CELEXA) tablet 20 mg  20 mg Oral Daily Tat, David, MD   20 mg at 01/18/18 0949  . enoxaparin (LOVENOX) injection 40 mg  40 mg Subcutaneous Q24H Tat, David, MD   40 mg at 01/18/18 1606  . haloperidol lactate (HALDOL) injection 5 mg  5 mg Intravenous Q6H PRN Orson Eva, MD   5 mg at 01/16/18 2110  . OLANZapine (ZYPREXA) tablet 5 mg  5 mg Oral Benay Pike, MD   5 mg at 01/17/18 2301  . ondansetron (ZOFRAN) tablet 4 mg  4 mg Oral Q6H PRN Tat, Shanon Brow, MD       Or  . ondansetron (ZOFRAN) injection 4 mg  4 mg Intravenous Q6H PRN Tat, Shanon Brow, MD      . oxyCODONE-acetaminophen (PERCOCET/ROXICET) 5-325 MG per tablet 1 tablet  1 tablet Oral Q8H PRN Tat, Shanon Brow, MD   1 tablet at 01/15/18 1714   And  . oxyCODONE (Oxy IR/ROXICODONE) immediate release tablet 5 mg  5 mg Oral Q8H PRN Tat, David, MD      . sodium chloride flush  (NS) 0.9 % injection 3 mL  3 mL Intravenous Therisa Doyne, MD   3 mL at 01/18/18 0948  . sodium chloride flush (NS) 0.9 % injection 3 mL  3 mL Intravenous PRN Tat, Shanon Brow, MD      . tamsulosin [facility]) capsule 0.4 mg  0.4 mg Oral Daily Tat, David, MD   0.4 mg at 01/18/18 0949     Objective: Vital signs in last 24 hours: Temp:  [98 F (36.7 C)-98.4 F (36.9 C)] 98.4 F (36.9 C) (01/06 1400) Pulse Rate:  [83-94] 83 (01/06 1400) Resp:  [17-18] 17 (01/06 0551) BP: (99-136)/(62-70) 105/62 (01/06 1400) SpO2:  [95 %-99 %] 98 % (01/06 1400)  Intake/Output from previous day: 01/05 0701 - 01/06 0700 In: 1419.3 [P.O.:480; I.V.:489.3] Out: -  Intake/Output this shift: Total I/O In: 480 [P.O.:480] Out: -    Physical Exam Vitals signs reviewed.  Constitutional:      Appearance: Normal appearance.  HENT:     Head: Normocephalic and atraumatic.  Cardiovascular:     Rate and Rhythm: Normal rate and regular rhythm.  Pulmonary:     Effort: Pulmonary effort is normal. No respiratory distress.  Abdominal:     General: Abdomen is flat.     Palpations: Abdomen is soft. There is no mass.  Genitourinary:    Comments: There is a foley at the meatus with no urine in the tube.  He is uncircumcised and there is a paraphimosis with a very tight phimotic ring.    I was able to reduce the paraphimosis after replacing the foley.   Scrotum and testes are unremarkable.   Musculoskeletal: Normal range of motion.  Skin:    General: Skin is warm and dry.  Neurological:     General: No focal deficit present.     Mental Status: He is alert and oriented to person, place, and time.  Psychiatric:        Mood and Affect: Mood normal.        Behavior: Behavior normal.     Lab Results:  Recent Labs    01/17/18 0626 01/18/18 0545  WBC 13.0* 8.0  HGB 11.3* 10.0*  HCT 34.7* 31.8*  PLT 165 136*   BMET Recent Labs    01/17/18 0626 01/18/18 0545  NA 138 139  K 3.1* 3.4*  CL 106 106  CO2 25 26   GLUCOSE 109* 105*  BUN 24* 21  CREATININE 0.75 0.71  CALCIUM 8.6* 8.5*   PT/INR No results for input(s): LABPROT, INR in the last 72 hours. ABG No results for input(s): PHART, HCO3 in the last 72 hours.  Invalid input(s): PCO2, PO2  Studies/Results: US Renal  Result Date: 01/18/2018 CLINICAL DATA:  Inpatient.  Anuria with Foley catheter. EXAM: RENAL / URINARY TRACT ULTRASOUND COMPLETE COMPARISON:  12/08/2017 CT abdomen/pelvis. FINDINGS: Right Kidney: Renal measurements: 12.2 x 5.4 x 5.5 cm = volume: 189 mL . Echogenicity within normal limits. No mass or hydronephrosis visualized. Incidentally noted echogenic liver. Left Kidney: Renal measurements: 14.0 x 5.1 x 5.1 cm = volume: 187 mL. Echogenicity within normal limits. No mass or hydronephrosis visualized. Bladder: Prominently distended bladder (865 cc bladder volume). Foley catheter balloon is not definitely demonstrated within the bladder lumen. There is layering debris within the bladder. Mild diffuse bladder wall thickening and trabeculation. IMPRESSION: 1. Prominently distended bladder (865 cc bladder volume). Foley catheter balloon not definitely visualized within the bladder lumen, cannot exclude malpositioned Foley catheter. CT of the pelvis could be obtained for further evaluation if clinically warranted. 2. Nonspecific layering debris within the bladder. Mild diffuse bladder wall thickening and trabeculation. While these findings may be due to chronic bladder voiding dysfunction, acute  cystitis, not excluded and correlation with urinalysis is suggested. 3. No hydronephrosis.  Normal kidneys. 4. Incidental finding of echogenic liver, nonspecific, most commonly due to diffuse hepatic steatosis. Electronically Signed   By: Ilona Sorrel M.D.   On: 01/18/2018 10:24   Case discussed with Dr. Carles Collet .  [place] films and report reviewed.  Labs reviewed.   UA reviewed.   The current foley was removed and was replaced with an 36fr coude foley after a  betadine prep and urethral lubrication.  There was resistance to passage that felt like it was somewhere between the membranous urethral and bladder neck but I was able to get the catheter fully inserted.  The balloon was filled with 44ml of sterile water and the catheter was placed to straight drainage with return of several hundred ml of dark urine without clots or active bleeding.   After foley placement, the paraphimosis was reduced.   Assessment: History of urethral stricture with traumatic foley removal and subsequent incomplete insertion with retention.   Foley replaced with an 73fr coude with some resistance at the bladder neck.  Now in good position and draining well.  He should have a voiding trial in a week.   Paraphimosis.   I reduced the paraphimosis.  Don't leave foreskin retracted.    CC: Dr. Shanon Brow Tat.      Irine Seal 01/18/2018 [phone]

## 2018-01-18 NOTE — Plan of Care (Signed)
  Problem: Acute Rehab PT Goals(only PT should resolve) Goal: Pt Will Go Supine/Side To Sit Outcome: Progressing Flowsheets (Taken 01/18/2018 1204) Pt will go Supine/Side to Sit: with min guard assist Goal: Patient Will Transfer Sit To/From Stand Outcome: Progressing Flowsheets (Taken 01/18/2018 1204) Patient will transfer sit to/from stand: with supervision Goal: Pt Will Transfer Bed To Chair/Chair To Bed Outcome: Progressing Flowsheets (Taken 01/18/2018 1204) Pt will Transfer Bed to Chair/Chair to Bed: with supervision Goal: Pt Will Ambulate Outcome: Progressing Flowsheets (Taken 01/18/2018 1204) Pt will Ambulate: > 125 feet; with supervision; with rolling walker   12:04 PM, 01/18/18 Lonell Grandchild, MPT Physical Therapist with [facility] [phone] office [phone] mobile phone

## 2018-01-18 NOTE — NC FL2 (Signed)
[place] LEVEL OF CARE SCREENING TOOL     IDENTIFICATION  Patient Name: [name] Birthdate: 03-07-1950 Sex: male Admission Date (Current Location): 01/15/2018  [facility] and [place] Number:  [employer] and Address:  [facility] [address] [place]      Provider Number: [id number]  Attending Physician Name and Address:  Orson Eva, MD  Relative Name and Phone Number:  Torrion Witter (wife) [phone]    Current Level of Care: Hospital Recommended Level of Care: [place] Prior Approval Number:    Date Approved/Denied:   PASRR Number:    Discharge Plan: SNF    Current Diagnoses: Patient Active Problem List   Diagnosis Date Noted  . Anuria 01/18/2018  . Acute retention of urine 01/17/2018  . Acute metabolic encephalopathy 95/28/4132  . Dehydration 01/15/2018  . Chronic pain syndrome 01/15/2018  . Scalp laceration   . Metastasis to lung ([place]) 10/19/2013  . Mucositis (ulcerative) due to antineoplastic therapy 06/10/2013  . Protein-calorie malnutrition, severe ([place]) 06/10/2013  . Rash 06/10/2013  . Gastrostomy tube dyslodgement - replaced 05/06/2013 05/06/2013  . Gastrostomy in place [facility]) 04/04/2013  . Tonsil cancer ([place]) 03/22/2013  . 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 Diabetes mellitus, type 2 ([place]) 10/18/2012  . Other and unspecified hyperlipidemia 10/18/2012  . Obesity, unspecified 10/18/2012    Orientation RESPIRATION BLADDER Height & Weight     Self, Place  Normal Continent Weight: 187 lb 2.7 oz (84.9 kg) Height:  6\' 2"  (188 cm)  BEHAVIORAL SYMPTOMS/MOOD NEUROLOGICAL BOWEL NUTRITION STATUS      Continent Diet(see dc summary)  AMBULATORY STATUS COMMUNICATION OF NEEDS Skin   Extensive Assist Verbally PU Stage and Appropriate Care(Stage I Buttocks, closed head laceration (3 staples))                       Personal Care Assistance Level of Assistance  Bathing, Feeding,  Dressing Bathing Assistance: Limited assistance Feeding assistance: Independent Dressing Assistance: Limited assistance     Functional Limitations Info  Sight, Hearing, Speech Sight Info: Adequate Hearing Info: Adequate Speech Info: Adequate    SPECIAL CARE FACTORS FREQUENCY  PT (By licensed PT)     PT Frequency: 5 times week              Contractures Contractures Info: Not present    Additional Factors Info  Code Status, Allergies, Psychotropic Code Status Info: full Allergies Info: Neurontin, Buspirone, Dilaudid Psychotropic Info: Xanax, Celexa         Current Medications (01/18/2018):  This is the current hospital active medication list Current Facility-Administered Medications  Medication Dose Route Frequency Provider Last Rate Last Dose  . 0.9 %  sodium chloride infusion  250 mL Intravenous PRN Tat, David, MD      . acetaminophen (TYLENOL) tablet 650 mg  650 mg Oral Q6H PRN Tat, Shanon Brow, MD   650 mg at 01/16/18 0809   Or  . acetaminophen (TYLENOL) suppository 650 mg  650 mg Rectal Q6H PRN Tat, Shanon Brow, MD      . ALPRAZolam Duanne Moron) tablet 1 mg  1 mg Oral TID Orson Eva, MD   1 mg at 01/18/18 0949  . citalopram (CELEXA) tablet 20 mg  20 mg Oral Daily Tat, David, MD   20 mg at 01/18/18 0949  . enoxaparin (LOVENOX) injection 40 mg  40 mg Subcutaneous Q24H Tat, Shanon Brow, MD   40 mg at 01/17/18 1903  .  haloperidol lactate (HALDOL) injection 5 mg  5 mg Intravenous Q6H PRN Orson Eva, MD   5 mg at 01/16/18 2110  . OLANZapine (ZYPREXA) tablet 5 mg  5 mg Oral Benay Pike, MD   5 mg at 01/17/18 2301  . ondansetron (ZOFRAN) tablet 4 mg  4 mg Oral Q6H PRN Tat, Shanon Brow, MD       Or  . ondansetron (ZOFRAN) injection 4 mg  4 mg Intravenous Q6H PRN Tat, Shanon Brow, MD      . oxyCODONE-acetaminophen (PERCOCET/ROXICET) 5-325 MG per tablet 1 tablet  1 tablet Oral Q8H PRN Tat, Shanon Brow, MD   1 tablet at 01/15/18 1714   And  . oxyCODONE (Oxy IR/ROXICODONE) immediate release tablet 5 mg  5 mg Oral  Q8H PRN Tat, David, MD      . sodium chloride flush (NS) 0.9 % injection 3 mL  3 mL Intravenous Therisa Doyne, MD   3 mL at 01/17/18 2000  . sodium chloride flush (NS) 0.9 % injection 3 mL  3 mL Intravenous PRN Tat, David, MD      . tamsulosin [facility]) capsule 0.4 mg  0.4 mg Oral Daily Tat, David, MD   0.4 mg at 01/18/18 9539     Discharge Medications: Please see discharge summary for a list of discharge medications.  Relevant Imaging Results:  Relevant Lab Results:   Additional Information SSN: [id number] LCSW

## 2018-01-18 NOTE — Clinical Social Work Note (Signed)
Clinical Social Work Assessment  Patient Details  Name: [name] MRN: [id number] Date of Birth: November 22, 1950  Date of referral:  01/18/18               Reason for consult:  Facility Placement, Discharge Planning                Permission sought to share information with:  Facility Art therapist granted to share information::     Name::        Agency::  [facility]  Relationship::     Contact Information:     Housing/Transportation Living arrangements for the past 2 months:  [place] of Information:  Adult Children Patient Interpreter Needed:  None Criminal Activity/Legal Involvement Pertinent to Current Situation/Hospitalization:  No - Comment as needed Significant Relationships:  Adult Children, Spouse Lives with:  Spouse Do you feel safe going back to the place where you live?  Yes Need for family participation in patient care:  No (Coment)  Care giving concerns: PT recommending SNF.   Social Worker assessment / plan: Pt is a 68 year old male referred to CSW for SNF rehab placement. Attempted to meet with pt this AM though he was sleeping and LCSW did not awaken him. Spoke with pt's daughter, [name], by phone to assess. Per [name], she and her mother (pt's wife) agree that SNF rehab is needed. They would like pt to remain in [place]. Will start referrals, PASRR, and insurance authorization. DC timeframe not yet known. Will follow.  Employment status:  Retired Nurse, adult PT Recommendations:  [place] / Referral to community resources:  [place]  Patient/Family's Response to care: Pt and family appear accepting of care.  Patient/Family's Understanding of and Emotional Response to Diagnosis, Current Treatment, and Prognosis: Pt and family appear to understand diagnosis and treatment recommendations. No emotional distress identified.  Emotional  Assessment Appearance:  Appears stated age Attitude/Demeanor/Rapport:  Unable to Assess Affect (typically observed):  Unable to Assess Orientation:  Oriented to Self, Oriented to Place Alcohol / Substance use:  Not Applicable Psych involvement (Current and /or in the community):  No (Comment)  Discharge Needs  Concerns to be addressed:  Discharge Planning Concerns Readmission within the last 30 days:  No Current discharge risk:  Physical Impairment, Dependent with Mobility Barriers to Discharge:  Ship broker, Continued Medical Work up   [employer], LCSW 01/18/2018, 11:59 AM
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
## 2018-01-19 DIAGNOSIS — R338 Other retention of urine: Secondary | ICD-10-CM | POA: Diagnosis not present

## 2018-01-19 DIAGNOSIS — L89309 Pressure ulcer of unspecified buttock, unspecified stage: Secondary | ICD-10-CM | POA: Diagnosis not present

## 2018-01-19 DIAGNOSIS — Y92009 Unspecified place in unspecified non-institutional (private) residence as the place of occurrence of the external cause: Secondary | ICD-10-CM | POA: Diagnosis not present

## 2018-01-19 DIAGNOSIS — E860 Dehydration: Secondary | ICD-10-CM | POA: Diagnosis not present

## 2018-01-19 DIAGNOSIS — Z23 Encounter for immunization: Secondary | ICD-10-CM | POA: Diagnosis present

## 2018-01-19 DIAGNOSIS — F419 Anxiety disorder, unspecified: Secondary | ICD-10-CM | POA: Diagnosis not present

## 2018-01-19 DIAGNOSIS — F339 Major depressive disorder, recurrent, unspecified: Secondary | ICD-10-CM | POA: Diagnosis not present

## 2018-01-19 DIAGNOSIS — R262 Difficulty in walking, not elsewhere classified: Secondary | ICD-10-CM | POA: Diagnosis not present

## 2018-01-19 DIAGNOSIS — G894 Chronic pain syndrome: Secondary | ICD-10-CM | POA: Diagnosis not present

## 2018-01-19 DIAGNOSIS — W1830XA Fall on same level, unspecified, initial encounter: Secondary | ICD-10-CM | POA: Diagnosis not present

## 2018-01-19 DIAGNOSIS — R5381 Other malaise: Secondary | ICD-10-CM | POA: Diagnosis not present

## 2018-01-19 DIAGNOSIS — G9341 Metabolic encephalopathy: Secondary | ICD-10-CM | POA: Diagnosis not present

## 2018-01-19 DIAGNOSIS — M6281 Muscle weakness (generalized): Secondary | ICD-10-CM | POA: Diagnosis not present

## 2018-01-19 DIAGNOSIS — R410 Disorientation, unspecified: Secondary | ICD-10-CM

## 2018-01-19 DIAGNOSIS — R339 Retention of urine, unspecified: Secondary | ICD-10-CM | POA: Diagnosis not present

## 2018-01-19 DIAGNOSIS — R34 Anuria and oliguria: Secondary | ICD-10-CM | POA: Diagnosis not present

## 2018-01-19 DIAGNOSIS — Z7401 Bed confinement status: Secondary | ICD-10-CM | POA: Diagnosis not present

## 2018-01-19 DIAGNOSIS — R531 Weakness: Secondary | ICD-10-CM | POA: Diagnosis not present

## 2018-01-19 DIAGNOSIS — E869 Volume depletion, unspecified: Secondary | ICD-10-CM | POA: Diagnosis not present

## 2018-01-19 DIAGNOSIS — R1312 Dysphagia, oropharyngeal phase: Secondary | ICD-10-CM | POA: Diagnosis not present

## 2018-01-19 DIAGNOSIS — G629 Polyneuropathy, unspecified: Secondary | ICD-10-CM | POA: Diagnosis not present

## 2018-01-19 DIAGNOSIS — R404 Transient alteration of awareness: Secondary | ICD-10-CM | POA: Diagnosis not present

## 2018-01-19 LAB — MAGNESIUM: Magnesium: 2.4 mg/dL (ref 1.7–2.4)

## 2018-01-19 LAB — BASIC METABOLIC PANEL
ANION GAP: 6 (ref 5–15)
BUN: 14 mg/dL (ref 8–23)
CALCIUM: 8.4 mg/dL — AB (ref 8.9–10.3)
CO2: 25 mmol/L (ref 22–32)
Chloride: 107 mmol/L (ref 98–111)
Creatinine, Ser: 0.63 mg/dL (ref 0.61–1.24)
GFR calc Af Amer: >60 mL/min (ref >60)
GFR calc non Af Amer: >60 mL/min (ref >60)
Glucose, Bld: 97 mg/dL (ref 70–99)
Potassium: 3.5 mmol/L (ref 3.5–5.1)
Sodium: 138 mmol/L (ref 135–145)

## 2018-01-19 MED ORDER — OXYCODONE-ACETAMINOPHEN 10-325 MG PO TABS: 1.0000 | ORAL_TABLET | Freq: Three times a day (TID) | ORAL | 0 refills | Status: DC | PRN

## 2018-01-19 MED ORDER — ALPRAZOLAM 1 MG PO TABS: 1.0000 mg | ORAL_TABLET | Freq: Three times a day (TID) | ORAL | 0 refills | Status: DC

## 2018-01-19 MED ORDER — OLANZAPINE 5 MG PO TABS: 5.0000 mg | ORAL_TABLET | Freq: Every day | ORAL | 0 refills | Status: DC
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
## 2018-01-19 NOTE — Progress Notes (Signed)
Report called to [place], LPN at [facility], [place] and all questions answered.  Patient awaiting transport to facility by [facility]. IV removed, patient tolerated well, 2x2 gauze and paper tape applied to site.

## 2018-01-19 NOTE — Clinical Social Work Placement (Addendum)
   CLINICAL SOCIAL WORK PLACEMENT  NOTE  Date:  01/19/2018  Patient Details  Name: [name] MRN: [id number] Date of Birth: 1950-10-20  Clinical Social Work is seeking post-discharge placement for this patient at the [place] level of care (*CSW will initial, date and re-position this form in  chart as items are completed):  Yes   Patient/family provided with [place] Work Department's list of facilities offering this level of care within the geographic area requested by the patient (or if unable, by the patient's family).  Yes   Patient/family informed of their freedom to choose among providers that offer the needed level of care, that participate in Medicare, Medicaid or managed care program needed by the patient, have an available bed and are willing to accept the patient.  Yes   Patient/family informed of [facility]'s ownership interest in [facility] and [facility], as well as of the fact that they are under no obligation to receive care at these facilities.  PASRR submitted to EDS on 01/18/18     PASRR number received on       Existing PASRR number confirmed on       FL2 transmitted to all facilities in geographic area requested by pt/family on 01/18/18     FL2 transmitted to all facilities within larger geographic area on       Patient informed that his/her managed care company has contracts with or will negotiate with certain facilities, including the following:        Yes   Patient/family informed of bed offers received.  Patient chooses bed at [facility]     Physician recommends and patient chooses bed at      Patient to be transferred to [facility] on 01/19/18.  Patient to be transferred to facility by [facility]     Patient family notified on 01/19/18 of transfer.  Name of family member notified:  daughter, angel      PHYSICIAN       Additional Comment:  Discharge clinicals sent. Facility aware. LCSW  signing off.  RN to call transport.  _______________________________________________ Ihor Gully, LCSW 01/19/2018, 4:05 PM

## 2018-01-19 NOTE — Clinical Social Work Note (Addendum)
LCSW following. Spoke with pt's daughter, Glenard Haring, this AM to update that pt did not receive a bed offer from requested facilities and that a new option was needed. Glenard Haring requested referral to [facility]. Referral sent. Still awaiting insurance authorization and also PASRR number. MD updated. Will follow.  1:04pm  [facility] has accepted pt and insurance has authorized him. Still awaiting PASRR number. Gerald Stabs from [facility] says they can take pt today if it is not too late when PASRR comes through. Otherwise will have to be tomorrow. Updated MD. Covering LCSW, Ambrose Pancoast, will follow this afternoon and further assist with pt's transition of care needs.

## 2018-01-22 DIAGNOSIS — G9341 Metabolic encephalopathy: Secondary | ICD-10-CM | POA: Diagnosis not present

## 2018-01-22 DIAGNOSIS — G629 Polyneuropathy, unspecified: Secondary | ICD-10-CM | POA: Diagnosis not present

## 2018-01-22 DIAGNOSIS — F339 Major depressive disorder, recurrent, unspecified: Secondary | ICD-10-CM | POA: Diagnosis not present

## 2018-01-22 DIAGNOSIS — R339 Retention of urine, unspecified: Secondary | ICD-10-CM | POA: Diagnosis not present

## 2018-01-25 DIAGNOSIS — R339 Retention of urine, unspecified: Secondary | ICD-10-CM | POA: Diagnosis not present

## 2018-01-25 DIAGNOSIS — G9341 Metabolic encephalopathy: Secondary | ICD-10-CM | POA: Diagnosis not present

## 2018-01-25 DIAGNOSIS — G629 Polyneuropathy, unspecified: Secondary | ICD-10-CM | POA: Diagnosis not present

## 2018-01-25 DIAGNOSIS — E869 Volume depletion, unspecified: Secondary | ICD-10-CM | POA: Diagnosis not present

## 2018-02-08 ENCOUNTER — Other Ambulatory Visit: Payer: Self-pay

## 2018-02-08 NOTE — Patient Outreach (Signed)
[facility]) Care Management  02/08/2018  [name] 04-23-1950 [id number]    EMMI-General Discharge RED ON EMMI ALERT Day # 4 Date: 02/05/2018 Red Alert Reason: " Scheduled follow up? No  Sad/hopeless/anxious/empty? Yes"   Outreach attempt #1 to patient. No answer. RN CM left HIPAA compliant voicemail message along with contact info.        Plan: RN CM will make outreach attempt to patient within 3-4 business days. RN CM will send unsuccessful outreach letter to patient.  Enzo Montgomery, RN,BSN,CCM [facility] [facility] Telephonic Care Management Coordinator Direct Phone: [phone] Toll Free: [phone] Fax: [phone]

## 2018-02-09 ENCOUNTER — Other Ambulatory Visit: Payer: Self-pay

## 2018-02-09 DIAGNOSIS — Z6823 Body mass index (BMI) 23.0-23.9, adult: Secondary | ICD-10-CM | POA: Diagnosis not present

## 2018-02-09 DIAGNOSIS — G9341 Metabolic encephalopathy: Secondary | ICD-10-CM | POA: Diagnosis not present

## 2018-02-09 DIAGNOSIS — F329 Major depressive disorder, single episode, unspecified: Secondary | ICD-10-CM | POA: Diagnosis not present

## 2018-02-09 DIAGNOSIS — F419 Anxiety disorder, unspecified: Secondary | ICD-10-CM | POA: Diagnosis not present

## 2018-02-09 NOTE — Patient Outreach (Signed)
[facility]) Care Management  02/09/2018  Jemario Poitras Heiny [birth year] [id number]     EMMI-General Discharge RED ON EMMI ALERT Day # 4 Date: 02/05/2018 Red Alert Reason: " Scheduled follow up? No  Sad/hopeless/anxious/empty? Yes"   Outreach attempt #2 to patient. No answer at present.      Plan: RN CM will make outreach attempt to patient within 3-4 business days.   Enzo Montgomery, RN,BSN,CCM [facility] Telephonic Care Management Coordinator Direct Phone: [phone] Toll Free: [phone] Fax: [phone]

## 2018-02-10 ENCOUNTER — Other Ambulatory Visit: Payer: Self-pay

## 2018-02-10 NOTE — Patient Outreach (Signed)
[facility]) Care Management  02/10/2018  [name] Nov 09, 1950 [id number]   EMMI-General Discharge RED ON EMMI ALERT Day #4 Date:02/05/2018 Red Alert Reason:" Scheduled follow up? No Sad/hopeless/anxious/empty? Yes"   Outreach attempt #3 to patient. No answer at present-HIPAA compliant voicemail message left.       Plan: RN CM will close case if no response from letter mailed to patient.   Enzo Montgomery, RN,BSN,CCM [place] [facility] Telephonic Care Management Coordinator Direct Phone: [phone] Toll Free: [phone] Fax: [phone]

## 2018-02-12 ENCOUNTER — Ambulatory Visit: Payer: Medicare Other | Admitting: Urology

## 2018-02-12 ENCOUNTER — Other Ambulatory Visit (HOSPITAL_COMMUNITY)
Admission: RE | Admit: 2018-02-12 | Discharge: 2018-02-12 | Disposition: A | Discharge status: Home / Self Care | Payer: Medicare Other | Source: Other Acute Inpatient Hospital | Attending: Urology | Admitting: Urology

## 2018-02-12 DIAGNOSIS — N472 Paraphimosis: Secondary | ICD-10-CM | POA: Diagnosis not present

## 2018-02-12 DIAGNOSIS — N312 Flaccid neuropathic bladder, not elsewhere classified: Secondary | ICD-10-CM

## 2018-02-12 DIAGNOSIS — R338 Other retention of urine: Secondary | ICD-10-CM | POA: Insufficient documentation

## 2018-02-13 LAB — URINE CULTURE: Culture: NO GROWTH

## 2018-02-14 DIAGNOSIS — F329 Major depressive disorder, single episode, unspecified: Secondary | ICD-10-CM | POA: Diagnosis not present

## 2018-02-14 DIAGNOSIS — F419 Anxiety disorder, unspecified: Secondary | ICD-10-CM | POA: Diagnosis not present

## 2018-02-14 DIAGNOSIS — G9341 Metabolic encephalopathy: Secondary | ICD-10-CM | POA: Diagnosis not present

## 2018-02-14 DIAGNOSIS — Z96641 Presence of right artificial hip joint: Secondary | ICD-10-CM | POA: Diagnosis not present

## 2018-02-14 DIAGNOSIS — G894 Chronic pain syndrome: Secondary | ICD-10-CM | POA: Diagnosis not present

## 2018-02-14 DIAGNOSIS — Z960 Presence of urogenital implants: Secondary | ICD-10-CM | POA: Diagnosis not present

## 2018-02-14 DIAGNOSIS — N319 Neuromuscular dysfunction of bladder, unspecified: Secondary | ICD-10-CM | POA: Diagnosis not present

## 2018-02-14 DIAGNOSIS — Z79899 Other long term (current) drug therapy: Secondary | ICD-10-CM | POA: Diagnosis not present

## 2018-02-14 DIAGNOSIS — C099 Malignant neoplasm of tonsil, unspecified: Secondary | ICD-10-CM | POA: Diagnosis not present

## 2018-02-14 DIAGNOSIS — E1142 Type 2 diabetes mellitus with diabetic polyneuropathy: Secondary | ICD-10-CM | POA: Diagnosis not present

## 2018-02-17 ENCOUNTER — Encounter (HOSPITAL_COMMUNITY): Payer: Self-pay

## 2018-02-19 ENCOUNTER — Other Ambulatory Visit: Payer: Self-pay

## 2018-02-19 NOTE — Patient Outreach (Signed)
[facility]) Care Management  02/19/2018  [name] 09-26-1950 [id number]   EMMI-General Discharge RED ON EMMI ALERT Day #4 Date:02/05/2018 Red Alert Reason:" Scheduled follow up? No Sad/hopeless/anxious/empty? Yes"   Multiple attempts to establish contact with patient without success. No response from letter mailed to patient. Case is being closed at this time.     Plan: RN CM will close case at this time due to unable to reach patient.  Enzo Montgomery, RN,BSN,CCM [place] [facility] Telephonic Care Management Coordinator Direct Phone: [phone] Toll Free: [phone] Fax: [phone]

## 2018-02-26 ENCOUNTER — Ambulatory Visit (INDEPENDENT_AMBULATORY_CARE_PROVIDER_SITE_OTHER): Payer: Medicare Other | Admitting: Urology

## 2018-02-26 ENCOUNTER — Other Ambulatory Visit (HOSPITAL_COMMUNITY)
Admission: RE | Admit: 2018-02-26 | Discharge: 2018-02-26 | Disposition: A | Discharge status: Home / Self Care | Payer: Medicare Other | Source: Ambulatory Visit | Attending: Urology | Admitting: Urology

## 2018-02-26 DIAGNOSIS — G9341 Metabolic encephalopathy: Secondary | ICD-10-CM | POA: Diagnosis not present

## 2018-02-26 DIAGNOSIS — F419 Anxiety disorder, unspecified: Secondary | ICD-10-CM | POA: Diagnosis not present

## 2018-02-26 DIAGNOSIS — R338 Other retention of urine: Secondary | ICD-10-CM | POA: Diagnosis not present

## 2018-02-26 DIAGNOSIS — E1142 Type 2 diabetes mellitus with diabetic polyneuropathy: Secondary | ICD-10-CM | POA: Diagnosis not present

## 2018-02-26 DIAGNOSIS — N312 Flaccid neuropathic bladder, not elsewhere classified: Secondary | ICD-10-CM | POA: Diagnosis not present

## 2018-02-26 DIAGNOSIS — G894 Chronic pain syndrome: Secondary | ICD-10-CM | POA: Diagnosis not present

## 2018-02-28 LAB — URINE CULTURE: Culture: 50000 — AB

## 2018-03-10 DIAGNOSIS — R338 Other retention of urine: Secondary | ICD-10-CM | POA: Diagnosis not present

## 2018-04-02 ENCOUNTER — Other Ambulatory Visit: Payer: Self-pay

## 2018-04-02 ENCOUNTER — Ambulatory Visit: Payer: Medicare Other | Admitting: Urology

## 2018-04-02 ENCOUNTER — Other Ambulatory Visit (HOSPITAL_COMMUNITY)
Admission: RE | Admit: 2018-04-02 | Discharge: 2018-04-02 | Disposition: A | Discharge status: Home / Self Care | Payer: Medicare Other | Source: Other Acute Inpatient Hospital | Attending: Urology | Admitting: Urology

## 2018-04-02 DIAGNOSIS — R338 Other retention of urine: Secondary | ICD-10-CM | POA: Diagnosis not present

## 2018-04-02 DIAGNOSIS — N312 Flaccid neuropathic bladder, not elsewhere classified: Secondary | ICD-10-CM | POA: Diagnosis not present

## 2018-04-02 DIAGNOSIS — N472 Paraphimosis: Secondary | ICD-10-CM | POA: Diagnosis not present

## 2018-04-04 LAB — URINE CULTURE: Culture: >=100000 — AB

## 2018-04-28 ENCOUNTER — Ambulatory Visit (INDEPENDENT_AMBULATORY_CARE_PROVIDER_SITE_OTHER): Payer: Medicare Other | Admitting: Urology

## 2018-04-28 DIAGNOSIS — R338 Other retention of urine: Secondary | ICD-10-CM

## 2018-05-11 ENCOUNTER — Ambulatory Visit (INDEPENDENT_AMBULATORY_CARE_PROVIDER_SITE_OTHER): Payer: Medicare Other | Admitting: Urology

## 2018-05-11 DIAGNOSIS — N312 Flaccid neuropathic bladder, not elsewhere classified: Secondary | ICD-10-CM

## 2018-05-11 DIAGNOSIS — R338 Other retention of urine: Secondary | ICD-10-CM | POA: Diagnosis not present

## 2018-05-14 DIAGNOSIS — R339 Retention of urine, unspecified: Secondary | ICD-10-CM | POA: Diagnosis not present

## 2018-05-14 DIAGNOSIS — N312 Flaccid neuropathic bladder, not elsewhere classified: Secondary | ICD-10-CM | POA: Diagnosis not present

## 2018-05-18 DIAGNOSIS — F419 Anxiety disorder, unspecified: Secondary | ICD-10-CM | POA: Diagnosis not present

## 2018-05-26 ENCOUNTER — Other Ambulatory Visit (HOSPITAL_COMMUNITY): Payer: Self-pay

## 2018-06-09 ENCOUNTER — Ambulatory Visit (HOSPITAL_COMMUNITY): Payer: Self-pay | Admitting: Hematology

## 2018-07-07 DIAGNOSIS — N312 Flaccid neuropathic bladder, not elsewhere classified: Secondary | ICD-10-CM | POA: Diagnosis not present

## 2018-07-07 DIAGNOSIS — R339 Retention of urine, unspecified: Secondary | ICD-10-CM | POA: Diagnosis not present

## 2018-08-10 ENCOUNTER — Ambulatory Visit (INDEPENDENT_AMBULATORY_CARE_PROVIDER_SITE_OTHER): Payer: Medicare Other | Admitting: Urology

## 2018-08-10 DIAGNOSIS — N312 Flaccid neuropathic bladder, not elsewhere classified: Secondary | ICD-10-CM | POA: Diagnosis not present

## 2018-08-10 DIAGNOSIS — R338 Other retention of urine: Secondary | ICD-10-CM | POA: Diagnosis not present

## 2018-08-24 DIAGNOSIS — R339 Retention of urine, unspecified: Secondary | ICD-10-CM | POA: Diagnosis not present

## 2018-08-24 DIAGNOSIS — N312 Flaccid neuropathic bladder, not elsewhere classified: Secondary | ICD-10-CM | POA: Diagnosis not present

## 2018-10-12 DIAGNOSIS — R339 Retention of urine, unspecified: Secondary | ICD-10-CM | POA: Diagnosis not present

## 2018-10-12 DIAGNOSIS — N312 Flaccid neuropathic bladder, not elsewhere classified: Secondary | ICD-10-CM | POA: Diagnosis not present

## 2018-12-07 DIAGNOSIS — R339 Retention of urine, unspecified: Secondary | ICD-10-CM | POA: Diagnosis not present

## 2018-12-07 DIAGNOSIS — N312 Flaccid neuropathic bladder, not elsewhere classified: Secondary | ICD-10-CM | POA: Diagnosis not present

## 2018-12-15 DIAGNOSIS — F419 Anxiety disorder, unspecified: Secondary | ICD-10-CM | POA: Diagnosis not present

## 2019-01-24 DIAGNOSIS — R339 Retention of urine, unspecified: Secondary | ICD-10-CM | POA: Diagnosis not present

## 2019-01-24 DIAGNOSIS — N312 Flaccid neuropathic bladder, not elsewhere classified: Secondary | ICD-10-CM | POA: Diagnosis not present

## 2019-02-17 DIAGNOSIS — R339 Retention of urine, unspecified: Secondary | ICD-10-CM | POA: Diagnosis not present

## 2019-02-17 DIAGNOSIS — N312 Flaccid neuropathic bladder, not elsewhere classified: Secondary | ICD-10-CM | POA: Diagnosis not present

## 2019-03-28 DIAGNOSIS — F419 Anxiety disorder, unspecified: Secondary | ICD-10-CM | POA: Diagnosis not present

## 2019-03-30 DIAGNOSIS — N312 Flaccid neuropathic bladder, not elsewhere classified: Secondary | ICD-10-CM | POA: Diagnosis not present

## 2019-03-30 DIAGNOSIS — R339 Retention of urine, unspecified: Secondary | ICD-10-CM | POA: Diagnosis not present

## 2019-04-05 ENCOUNTER — Telehealth: Payer: Self-pay | Admitting: Urology

## 2019-04-05 NOTE — Telephone Encounter (Signed)
Created in error

## 2019-04-20 DIAGNOSIS — N342 Other urethritis: Secondary | ICD-10-CM | POA: Diagnosis not present

## 2019-04-20 DIAGNOSIS — N390 Urinary tract infection, site not specified: Secondary | ICD-10-CM | POA: Diagnosis not present

## 2019-04-26 DIAGNOSIS — R339 Retention of urine, unspecified: Secondary | ICD-10-CM | POA: Diagnosis not present

## 2019-04-26 DIAGNOSIS — N312 Flaccid neuropathic bladder, not elsewhere classified: Secondary | ICD-10-CM | POA: Diagnosis not present

## 2019-05-24 DIAGNOSIS — R339 Retention of urine, unspecified: Secondary | ICD-10-CM | POA: Diagnosis not present

## 2019-05-24 DIAGNOSIS — N312 Flaccid neuropathic bladder, not elsewhere classified: Secondary | ICD-10-CM | POA: Diagnosis not present

## 2019-07-07 DIAGNOSIS — N312 Flaccid neuropathic bladder, not elsewhere classified: Secondary | ICD-10-CM | POA: Diagnosis not present

## 2019-07-07 DIAGNOSIS — R339 Retention of urine, unspecified: Secondary | ICD-10-CM | POA: Diagnosis not present

## 2019-09-13 DIAGNOSIS — N312 Flaccid neuropathic bladder, not elsewhere classified: Secondary | ICD-10-CM | POA: Diagnosis not present

## 2019-09-13 DIAGNOSIS — R339 Retention of urine, unspecified: Secondary | ICD-10-CM | POA: Diagnosis not present

## 2019-10-04 DIAGNOSIS — R339 Retention of urine, unspecified: Secondary | ICD-10-CM | POA: Diagnosis not present

## 2019-10-04 DIAGNOSIS — N312 Flaccid neuropathic bladder, not elsewhere classified: Secondary | ICD-10-CM | POA: Diagnosis not present

## 2019-11-10 DIAGNOSIS — Z1389 Encounter for screening for other disorder: Secondary | ICD-10-CM | POA: Diagnosis not present

## 2019-11-10 DIAGNOSIS — Z6826 Body mass index (BMI) 26.0-26.9, adult: Secondary | ICD-10-CM | POA: Diagnosis not present

## 2019-11-10 DIAGNOSIS — Z0000 Encounter for general adult medical examination without abnormal findings: Secondary | ICD-10-CM | POA: Diagnosis not present

## 2019-11-10 DIAGNOSIS — Z0001 Encounter for general adult medical examination with abnormal findings: Secondary | ICD-10-CM | POA: Diagnosis not present

## 2019-11-10 DIAGNOSIS — Z23 Encounter for immunization: Secondary | ICD-10-CM | POA: Diagnosis not present

## 2019-11-10 DIAGNOSIS — R7309 Other abnormal glucose: Secondary | ICD-10-CM | POA: Diagnosis not present

## 2019-11-10 DIAGNOSIS — E663 Overweight: Secondary | ICD-10-CM | POA: Diagnosis not present

## 2019-11-22 DIAGNOSIS — N312 Flaccid neuropathic bladder, not elsewhere classified: Secondary | ICD-10-CM | POA: Diagnosis not present

## 2019-11-22 DIAGNOSIS — R339 Retention of urine, unspecified: Secondary | ICD-10-CM | POA: Diagnosis not present

## 2020-01-24 DIAGNOSIS — R339 Retention of urine, unspecified: Secondary | ICD-10-CM | POA: Diagnosis not present

## 2020-01-24 DIAGNOSIS — N312 Flaccid neuropathic bladder, not elsewhere classified: Secondary | ICD-10-CM | POA: Diagnosis not present

## 2020-03-23 DIAGNOSIS — N312 Flaccid neuropathic bladder, not elsewhere classified: Secondary | ICD-10-CM | POA: Diagnosis not present

## 2020-03-23 DIAGNOSIS — R339 Retention of urine, unspecified: Secondary | ICD-10-CM | POA: Diagnosis not present

## 2020-04-18 DIAGNOSIS — E663 Overweight: Secondary | ICD-10-CM | POA: Diagnosis not present

## 2020-04-18 DIAGNOSIS — Z6826 Body mass index (BMI) 26.0-26.9, adult: Secondary | ICD-10-CM | POA: Diagnosis not present

## 2020-04-18 DIAGNOSIS — F419 Anxiety disorder, unspecified: Secondary | ICD-10-CM | POA: Diagnosis not present

## 2020-04-18 DIAGNOSIS — R21 Rash and other nonspecific skin eruption: Secondary | ICD-10-CM | POA: Diagnosis not present

## 2020-04-19 DIAGNOSIS — N312 Flaccid neuropathic bladder, not elsewhere classified: Secondary | ICD-10-CM | POA: Diagnosis not present

## 2020-04-19 DIAGNOSIS — R339 Retention of urine, unspecified: Secondary | ICD-10-CM | POA: Diagnosis not present

## 2020-06-12 DIAGNOSIS — N312 Flaccid neuropathic bladder, not elsewhere classified: Secondary | ICD-10-CM | POA: Diagnosis not present

## 2020-06-12 DIAGNOSIS — R339 Retention of urine, unspecified: Secondary | ICD-10-CM | POA: Diagnosis not present

## 2020-07-03 DIAGNOSIS — R339 Retention of urine, unspecified: Secondary | ICD-10-CM | POA: Diagnosis not present

## 2020-07-03 DIAGNOSIS — N312 Flaccid neuropathic bladder, not elsewhere classified: Secondary | ICD-10-CM | POA: Diagnosis not present

## 2020-07-27 DIAGNOSIS — N312 Flaccid neuropathic bladder, not elsewhere classified: Secondary | ICD-10-CM | POA: Diagnosis not present

## 2020-07-27 DIAGNOSIS — R339 Retention of urine, unspecified: Secondary | ICD-10-CM | POA: Diagnosis not present

## 2020-08-16 DIAGNOSIS — N312 Flaccid neuropathic bladder, not elsewhere classified: Secondary | ICD-10-CM | POA: Diagnosis not present

## 2020-08-16 DIAGNOSIS — R339 Retention of urine, unspecified: Secondary | ICD-10-CM | POA: Diagnosis not present

## 2020-09-12 DIAGNOSIS — R040 Epistaxis: Secondary | ICD-10-CM | POA: Diagnosis not present

## 2020-09-12 DIAGNOSIS — Z6825 Body mass index (BMI) 25.0-25.9, adult: Secondary | ICD-10-CM | POA: Diagnosis not present

## 2020-09-12 DIAGNOSIS — F419 Anxiety disorder, unspecified: Secondary | ICD-10-CM | POA: Diagnosis not present

## 2020-09-12 DIAGNOSIS — E663 Overweight: Secondary | ICD-10-CM | POA: Diagnosis not present

## 2020-11-12 DIAGNOSIS — N312 Flaccid neuropathic bladder, not elsewhere classified: Secondary | ICD-10-CM | POA: Diagnosis not present

## 2020-11-12 DIAGNOSIS — R339 Retention of urine, unspecified: Secondary | ICD-10-CM | POA: Diagnosis not present

## 2020-11-17 IMAGING — US RENAL/URINARY TRACT ULTRASOUND
1 series · 13 of 25 positions shown · non-contrast
Comparison: 12/08/2017 CT abdomen/pelvis.

CLINICAL DATA: Inpatient.  Anuria with Foley catheter.

EXAM:
RENAL / URINARY TRACT ULTRASOUND COMPLETE

[Series 1: renal/urinary tract ultrasound · 0.23mm/px · 13 of 82 slices shown]
[im 1/82]
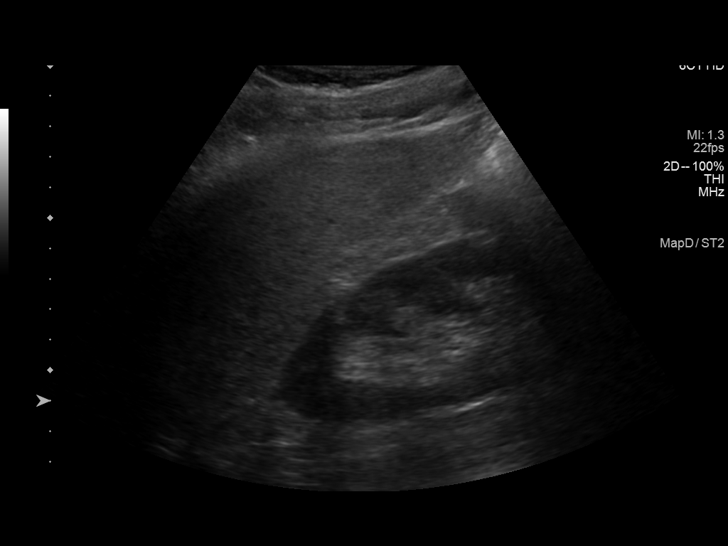
[im 7/82]
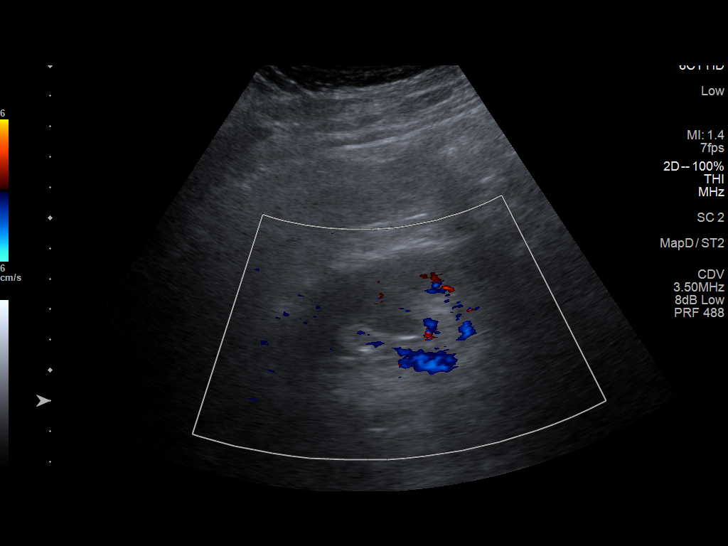
[im 14/82]
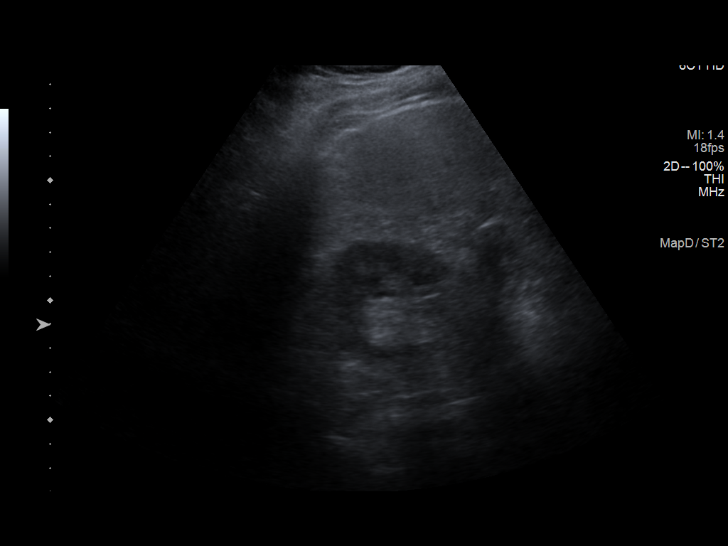
[im 21/82]
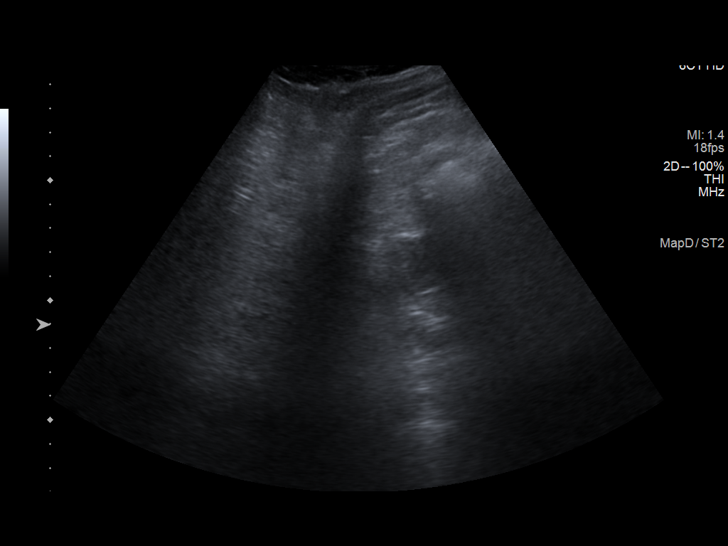
[im 28/82]
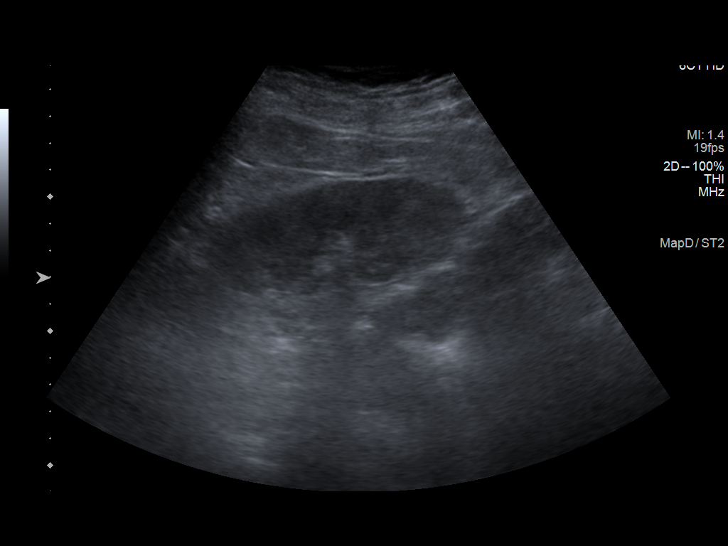
[im 34/82]
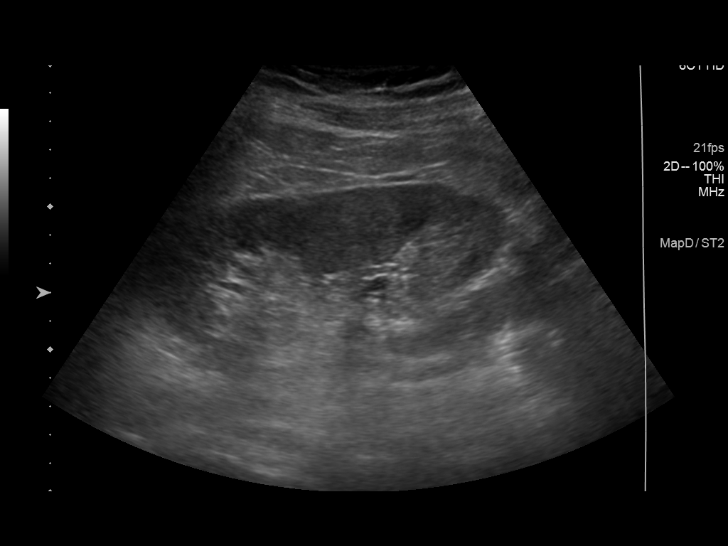
[im 41/82]
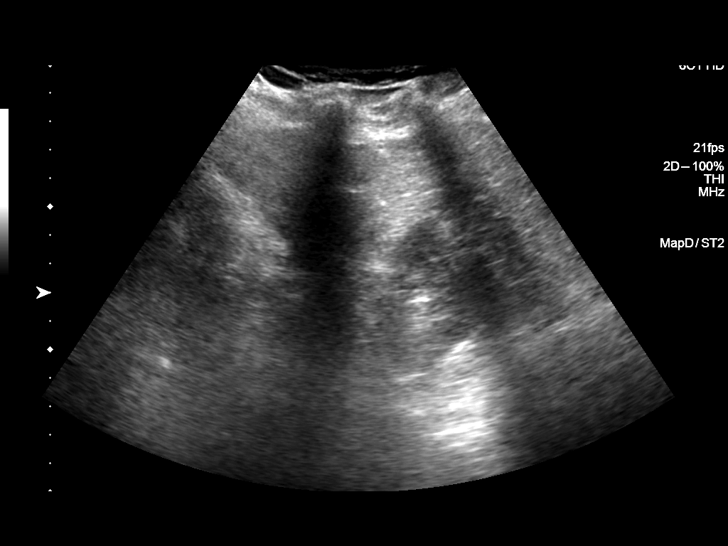
[im 48/82]
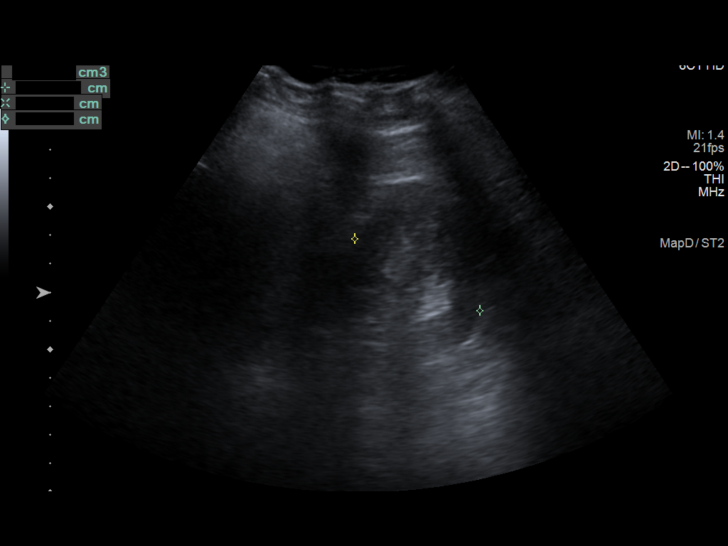
[im 55/82]
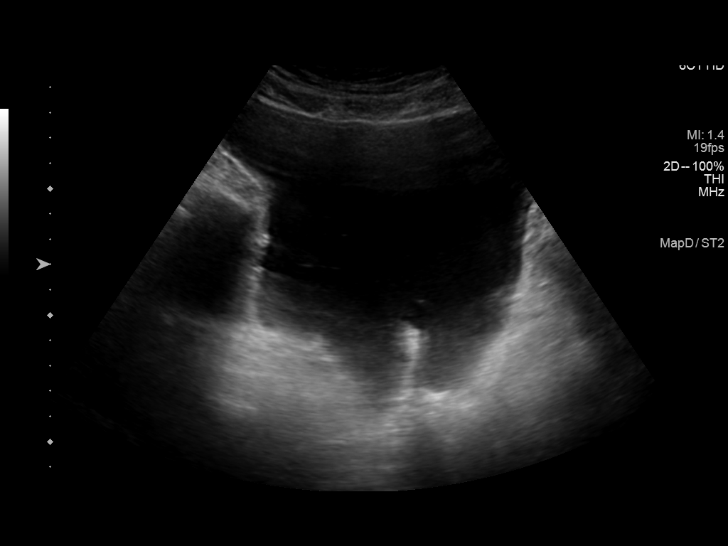
[im 61/82]
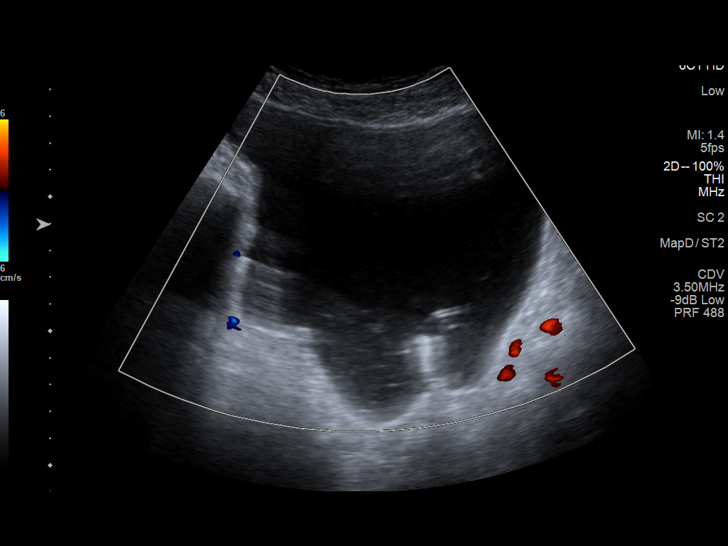
[im 68/82]
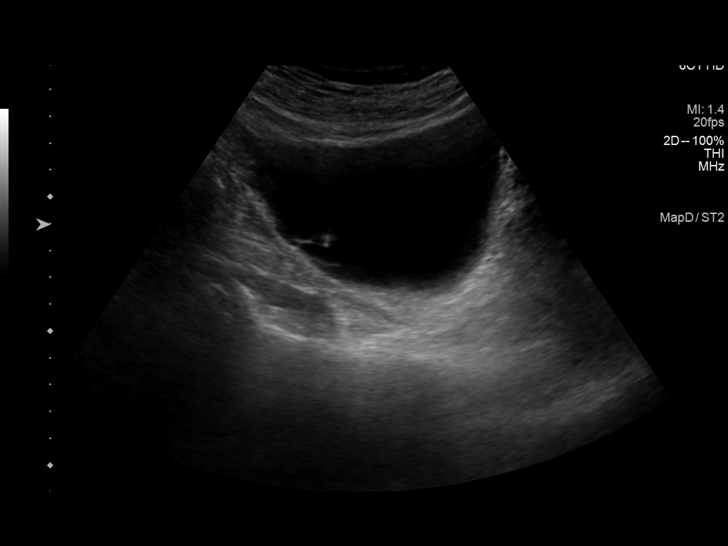
[im 75/82]
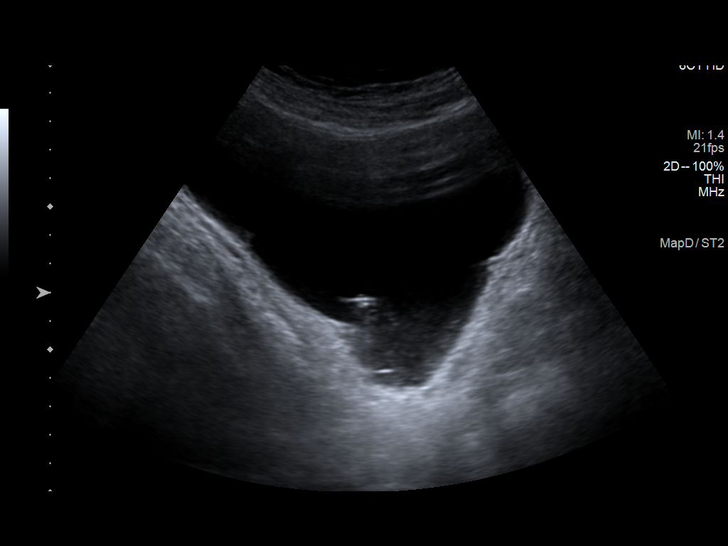
[im 82/82]
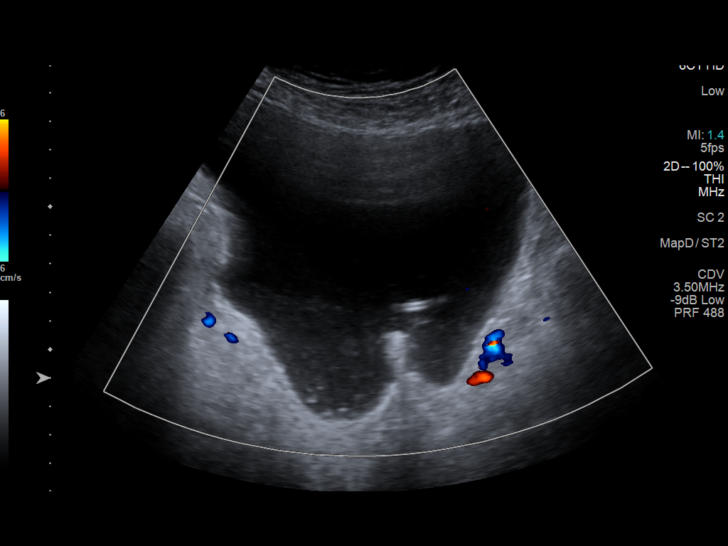

[13 of 25 positions shown; findings below may reference images not displayed]

FINDINGS: Right Kidney:

Renal measurements: 12.2 x 5.4 x 5.5 cm = volume: 189 mL .
Echogenicity within normal limits. No mass or hydronephrosis
visualized. Incidentally noted echogenic liver.

Left Kidney:

Renal measurements: 14.0 x 5.1 x 5.1 cm = volume: 187 mL.
Echogenicity within normal limits. No mass or hydronephrosis
visualized.

Bladder:

Prominently distended bladder (865 cc bladder volume). Foley
catheter balloon is not definitely demonstrated within the bladder
lumen. There is layering debris within the bladder. Mild diffuse
bladder wall thickening and trabeculation.
IMPRESSION: 1. Prominently distended bladder (865 cc bladder volume). Foley
catheter balloon not definitely visualized within the bladder lumen,
cannot exclude malpositioned Foley catheter. CT of the pelvis could
be obtained for further evaluation if clinically warranted.
2. Nonspecific layering debris within the bladder. Mild diffuse
bladder wall thickening and trabeculation. While these findings may
be due to chronic bladder voiding dysfunction, acute cystitis, not
excluded and correlation with urinalysis is suggested.
3. No hydronephrosis.  Normal kidneys.
4. Incidental finding of echogenic liver, nonspecific, most commonly
due to diffuse hepatic steatosis.

## 2020-12-12 DIAGNOSIS — Z23 Encounter for immunization: Secondary | ICD-10-CM | POA: Diagnosis not present

## 2020-12-14 DIAGNOSIS — R339 Retention of urine, unspecified: Secondary | ICD-10-CM | POA: Diagnosis not present

## 2020-12-14 DIAGNOSIS — N312 Flaccid neuropathic bladder, not elsewhere classified: Secondary | ICD-10-CM | POA: Diagnosis not present

## 2021-01-16 DIAGNOSIS — F419 Anxiety disorder, unspecified: Secondary | ICD-10-CM | POA: Diagnosis not present

## 2021-01-16 DIAGNOSIS — E663 Overweight: Secondary | ICD-10-CM | POA: Diagnosis not present

## 2021-01-16 DIAGNOSIS — Z6824 Body mass index (BMI) 24.0-24.9, adult: Secondary | ICD-10-CM | POA: Diagnosis not present

## 2021-02-15 DIAGNOSIS — R339 Retention of urine, unspecified: Secondary | ICD-10-CM | POA: Diagnosis not present

## 2021-02-15 DIAGNOSIS — N312 Flaccid neuropathic bladder, not elsewhere classified: Secondary | ICD-10-CM | POA: Diagnosis not present

## 2021-04-05 DIAGNOSIS — N312 Flaccid neuropathic bladder, not elsewhere classified: Secondary | ICD-10-CM | POA: Diagnosis not present

## 2021-04-05 DIAGNOSIS — R339 Retention of urine, unspecified: Secondary | ICD-10-CM | POA: Diagnosis not present

## 2021-05-10 DIAGNOSIS — N312 Flaccid neuropathic bladder, not elsewhere classified: Secondary | ICD-10-CM | POA: Diagnosis not present

## 2021-05-10 DIAGNOSIS — R339 Retention of urine, unspecified: Secondary | ICD-10-CM | POA: Diagnosis not present

## 2021-05-27 DIAGNOSIS — Z6823 Body mass index (BMI) 23.0-23.9, adult: Secondary | ICD-10-CM | POA: Diagnosis not present

## 2021-05-27 DIAGNOSIS — N400 Enlarged prostate without lower urinary tract symptoms: Secondary | ICD-10-CM | POA: Diagnosis not present

## 2021-05-27 DIAGNOSIS — F419 Anxiety disorder, unspecified: Secondary | ICD-10-CM | POA: Diagnosis not present

## 2021-05-27 DIAGNOSIS — F5101 Primary insomnia: Secondary | ICD-10-CM | POA: Diagnosis not present

## 2021-06-06 DIAGNOSIS — N400 Enlarged prostate without lower urinary tract symptoms: Secondary | ICD-10-CM | POA: Diagnosis not present

## 2021-06-06 DIAGNOSIS — Z1331 Encounter for screening for depression: Secondary | ICD-10-CM | POA: Diagnosis not present

## 2021-06-06 DIAGNOSIS — Z6824 Body mass index (BMI) 24.0-24.9, adult: Secondary | ICD-10-CM | POA: Diagnosis not present

## 2021-06-06 DIAGNOSIS — Z0001 Encounter for general adult medical examination with abnormal findings: Secondary | ICD-10-CM | POA: Diagnosis not present

## 2021-07-13 ENCOUNTER — Ambulatory Visit
Admission: EM | Admit: 2021-07-13 | Discharge: 2021-07-13 | Disposition: A | Discharge status: Home / Self Care | Payer: Medicare Other | Attending: Family Medicine | Admitting: Family Medicine

## 2021-07-13 ENCOUNTER — Other Ambulatory Visit: Payer: Self-pay

## 2021-07-13 ENCOUNTER — Encounter: Payer: Self-pay | Admitting: Emergency Medicine

## 2021-07-13 ENCOUNTER — Ambulatory Visit (INDEPENDENT_AMBULATORY_CARE_PROVIDER_SITE_OTHER): Payer: Medicare Other

## 2021-07-13 DIAGNOSIS — J189 Pneumonia, unspecified organism: Secondary | ICD-10-CM | POA: Diagnosis not present

## 2021-07-13 DIAGNOSIS — R531 Weakness: Secondary | ICD-10-CM

## 2021-07-13 DIAGNOSIS — R0902 Hypoxemia: Secondary | ICD-10-CM

## 2021-07-13 DIAGNOSIS — Z85118 Personal history of other malignant neoplasm of bronchus and lung: Secondary | ICD-10-CM | POA: Diagnosis not present

## 2021-07-13 DIAGNOSIS — U071 COVID-19: Secondary | ICD-10-CM | POA: Diagnosis not present

## 2021-07-13 DIAGNOSIS — J9811 Atelectasis: Secondary | ICD-10-CM | POA: Diagnosis not present

## 2021-07-13 HISTORY — DX: Malignant neoplasm of unspecified part of unspecified bronchus or lung: C34.90

## 2021-07-13 MED ORDER — MOLNUPIRAVIR EUA 200MG CAPSULE: 4.0000 | ORAL_CAPSULE | Freq: Two times a day (BID) | ORAL | 0 refills | Status: AC

## 2021-07-13 MED ORDER — ALBUTEROL SULFATE HFA 108 (90 BASE) MCG/ACT IN AERS: 1.0000 | INHALATION_SPRAY | RESPIRATORY_TRACT | 0 refills | Status: DC | PRN

## 2021-07-13 MED ORDER — DOXYCYCLINE HYCLATE 100 MG PO CAPS: 100.0000 mg | ORAL_CAPSULE | Freq: Two times a day (BID) | ORAL | 0 refills | Status: DC

## 2021-07-13 MED ORDER — PREDNISONE 20 MG PO TABS: 40.0000 mg | ORAL_TABLET | Freq: Every day | ORAL | 0 refills | Status: DC

## 2021-07-13 NOTE — ED Triage Notes (Addendum)
Pt reports generalized body aches and tested positive for covid this am. Pt denies shortness of breath, chest pain. Pt 02 saturation 88-92%. PA aware and at bedside.

## 2021-07-13 NOTE — Discharge Instructions (Signed)
Please monitor your symptoms very closely and if you begin to worsen or do not improve with the medications that I have sent please go to the emergency department immediately.

## 2021-07-13 NOTE — ED Provider Notes (Signed)
[facility]    CSN: [id number] Arrival date & time: 07/13/21  0955      History   Chief Complaint No chief complaint on file.   HPI [name] is a 71 y.o. male.   Presenting today with 1 day history of generalized body aches worse in the left flank, weakness, mild nasal congestion.  States his wife resides in a nursing home and tested positive for COVID-19 to the nursing home also tested him this morning and it was positive for COVID-19.  He denies cough, chest pain, shortness of breath, dizziness, syncope.  Has been tolerating p.o. well.  He has a history of pneumonia, lung cancer, diabetes, and remote history of delirium.  Has not been trying any medications over-the-counter for symptoms.    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[place])    Type II diabetes mellitus ([place])    Urethral stricture    s/p dilitation    Patient Active Problem List   Diagnosis Date Noted   Delirium    Anuria 01/18/2018   Acute retention of urine 23/30/0762   Acute metabolic encephalopathy 26/33/3545   Dehydration 01/15/2018   Chronic pain syndrome 01/15/2018   Scalp laceration    Metastasis to lung ([place]) 10/19/2013   Mucositis (ulcerative) due to antineoplastic therapy 06/10/2013   Protein-calorie malnutrition, severe ([place]) 06/10/2013   Rash 06/10/2013   Gastrostomy tube dyslodgement - replaced 05/06/2013 05/06/2013   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 Karsten Ro   GASTROSTOMY TUBE PLACEMENT  04/04/2013   LAPAROSCOPIC GASTROSTOMY N/A 04/04/2013   Procedure: LAPAROSCOPIC GASTROSTOMY TUBE PLACEMENT ;  Surgeon: Ralene Ok, MD;  Location: [facility];  Service: General;  Laterality: N/A;   MULTIPLE EXTRACTIONS WITH ALVEOLOPLASTY N/A 04/14/2013   Procedure: Extraction of tooth #'s 1,2,3,4,5,6,7,8,9,10,11,12,13,14,15,17,18,19,20,21,22,23,24,25,26,27,28,29, 30, 31, and 32 with alveoloplasty and bilateral mandibular tori reductions.;  Surgeon: Lenn Cal, DDS;  Location: [facility];  Service: Oral Surgery;  Laterality: N/A;   NASAL HEMORRHAGE CONTROL N/A 04/04/2013   Procedure: Control of oropharyngeal hemorrhage;  Surgeon: Ascencion Dike, MD;  Location: [facility];  Service: ENT;  Laterality: N/A;   PORTACATH PLACEMENT Right 04/04/2013   PORTACATH PLACEMENT N/A 04/04/2013   Procedure: INSERTION PORT-A-CATH;  Surgeon: Ralene Ok, MD;  Location: [facility];  Service: General;  Laterality: N/A;   TOTAL HIP ARTHROPLASTY Right 2000   Dr. Percell Miller       Home Medications    Prior to Admission medications   Medication Sig Start Date End Date Taking? Authorizing Provider  albuterol (VENTOLIN HFA) 108 (90 Base)  MCG/ACT inhaler Inhale 1-2 puffs into the lungs every 4 (four) hours as needed for wheezing or shortness of breath. 07/13/21  Yes Volney American, PA-C  doxycycline (VIBRAMYCIN) 100 MG capsule Take 1 capsule (100 mg total) by mouth 2 (two) times daily. 07/13/21  Yes Volney American, PA-C  molnupiravir EUA (LAGEVRIO) 200 mg CAPS capsule Take 4 capsules (800 mg total) by mouth 2 (two) times daily for 5 days. 07/13/21 07/18/21 Yes Volney American, PA-C   predniSONE (DELTASONE) 20 MG tablet Take 2 tablets (40 mg total) by mouth daily with breakfast. 07/13/21  Yes Volney American, PA-C  acetaminophen (TYLENOL) 500 MG tablet Take 500-1,000 mg by mouth every 4 (four) hours as needed for mild pain. Reported on 12/27/2014    [provider]  ALPRAZolam Duanne Moron) 1 MG tablet Take 1 tablet (1 mg total) by mouth 3 (three) times daily. 01/19/18   Orson Eva, MD  citalopram (CELEXA) 40 MG tablet Take 1 tablet by mouth daily. 02/18/17   [provider]  OLANZapine (ZYPREXA) 5 MG tablet Take 1 tablet (5 mg total) by mouth at bedtime. 01/19/18   Orson Eva, MD  tamsulosin (FLOMAX) 0.4 MG CAPS capsule Take 0.4 mg by mouth daily.  10/30/17   [provider]    Family History Family History  Problem Relation Age of Onset   Cancer Cousin        living brain cancer, male   Cancer Mother        Deceased with leukemia, had uterine ca   Hyperlipidemia Brother     Social History Social History   Tobacco Use   Smoking status: Never   Smokeless tobacco: Never  Substance Use Topics   Alcohol use: No    Comment: Occasional beer   Drug use: No     Allergies   Neurontin [gabapentin], Buspirone, and Dilaudid [hydromorphone hcl]   Review of Systems Review of Systems Per HPI  Physical Exam Triage Vital Signs ED Triage Vitals  Enc Vitals Group     BP 07/13/21 1056 108/70     Pulse Rate 07/13/21 1056 90     Resp 07/13/21 1056 18     Temp 07/13/21 1056 98.7 F (37.1 C)     Temp Source 07/13/21 1056 Oral     SpO2 07/13/21 1056 (!) 89 %     Weight --      Height --      Head Circumference --      Peak Flow --      Pain Score 07/13/21 1057 0     Pain Loc --      Pain Edu? --      Excl. in [place]? --    No data found.  Updated Vital Signs BP 108/70 (BP Location: Right Arm)   Pulse 90   Temp 98.7 F (37.1 C) (Oral)   Resp 18   SpO2 92%   Visual Acuity Right Eye Distance:   Left Eye Distance:   Bilateral  Distance:    Right Eye Near:   Left Eye Near:    Bilateral Near:     Physical Exam Vitals and nursing note reviewed.  Constitutional:      Appearance: He is well-developed.  HENT:     Head: Atraumatic.     Right Ear: External ear normal.     Left Ear: External ear normal.     Nose: Rhinorrhea present.     Mouth/Throat:     Mouth:  Mucous membranes are moist.     Pharynx: Oropharynx is clear. No oropharyngeal exudate or posterior oropharyngeal erythema.  Eyes:     Extraocular Movements: Extraocular movements intact.     Conjunctiva/sclera: Conjunctivae normal.     Pupils: Pupils are equal, round, and reactive to light.  Cardiovascular:     Rate and Rhythm: Normal rate and regular rhythm.  Pulmonary:     Effort: Pulmonary effort is normal. No respiratory distress.     Breath sounds: Rales present. No wheezing.     Comments: Decreased breath sounds throughout.  Mild rales at bases Musculoskeletal:        General: Normal range of motion.     Cervical back: Normal range of motion and neck supple.  Lymphadenopathy:     Cervical: No cervical adenopathy.  Skin:    General: Skin is warm and dry.  Neurological:     Mental Status: He is alert.     Motor: No weakness.     Gait: Gait normal.     Comments: Appears oriented, unclear baseline.  Does seem a bit hard of hearing but answers questions appropriately once able to hear what you are saying.    Psychiatric:        Mood and Affect: Mood normal.        Behavior: Behavior normal.        Thought Content: Thought content normal.      UC Treatments / Results  Labs (all labs ordered are listed, but only abnormal results are displayed) Labs Reviewed - No data to display  EKG   Radiology DG Chest 2 View  Result Date: 07/13/2021 CLINICAL DATA:  Generalized body aches, weakness, tested positive for COVID-19 this morning, mild oxygen desaturation EXAM: CHEST - 2 VIEW COMPARISON:  01/15/2018 FINDINGS: RIGHT subclavian Port-A-Cath  with tip projecting over SVC. Normal heart size, mediastinal contours, and pulmonary vascularity. Bronchitic changes with bibasilar atelectasis. Cannot exclude coexistent infiltrate LEFT lower lobe. Upper lungs clear. No pleural effusion, pneumothorax or acute osseous findings. IMPRESSION: Bibasilar atelectasis, cannot exclude accompanying LEFT lower lobe infiltrate. Electronically Signed   By: Lavonia Dana M.D.   On: 07/13/2021 11:38    Procedures Procedures (including critical care time)  Medications Ordered in UC Medications - No data to display  Initial Impression / Assessment and Plan / UC Course  I have reviewed the triage vital signs and the nursing notes.  Pertinent labs & imaging results that were available during my care of the patient were reviewed by me and considered in my medical decision making (see chart for details).     Known positive COVID-19, we will treat this with molnupiravir.  Patient had oxygen desaturation in triage ranging between 89% and 93% on room air but breathing comfortably, speaking in full sentences and in no acute distress.  Chest x-ray was performed given this showing some possible left lower lobe infiltrate so will cover with prednisone, doxycycline for pneumonia secondary to COVID-19.  Albuterol inhaler given as well for rescue.  Discussed supportive care, strict ED precautions for any worsening symptoms.  He lives alone but did discuss having family members checking on him frequently in case worsening.  Final Clinical Impressions(s) / UC Diagnoses   Final diagnoses:  COVID-19  Pneumonia of left lower lobe due to infectious organism  History of lung cancer  Oxygen desaturation  Weakness     Discharge Instructions      Please monitor your symptoms very closely and if you begin to  worsen or do not improve with the medications that I have sent please go to the emergency department immediately.    ED Prescriptions     Medication Sig Dispense  Auth. Provider   predniSONE (DELTASONE) 20 MG tablet Take 2 tablets (40 mg total) by mouth daily with breakfast. 10 tablet Volney American, PA-C   albuterol (VENTOLIN HFA) 108 (90 Base) MCG/ACT inhaler Inhale 1-2 puffs into the lungs every 4 (four) hours as needed for wheezing or shortness of breath. 18 g Volney American, [place]   doxycycline (VIBRAMYCIN) 100 MG capsule Take 1 capsule (100 mg total) by mouth 2 (two) times daily. 20 capsule Volney American, PA-C   molnupiravir EUA (LAGEVRIO) 200 mg CAPS capsule Take 4 capsules (800 mg total) by mouth 2 (two) times daily for 5 days. 40 capsule Volney American, [place]      PDMP not reviewed this encounter.   Volney American, [place] 07/13/21 1212

## 2021-07-30 DIAGNOSIS — R339 Retention of urine, unspecified: Secondary | ICD-10-CM | POA: Diagnosis not present

## 2021-07-30 DIAGNOSIS — N312 Flaccid neuropathic bladder, not elsewhere classified: Secondary | ICD-10-CM | POA: Diagnosis not present

## 2021-08-20 DIAGNOSIS — R339 Retention of urine, unspecified: Secondary | ICD-10-CM | POA: Diagnosis not present

## 2021-08-20 DIAGNOSIS — N312 Flaccid neuropathic bladder, not elsewhere classified: Secondary | ICD-10-CM | POA: Diagnosis not present

## 2021-11-19 DIAGNOSIS — Z6824 Body mass index (BMI) 24.0-24.9, adult: Secondary | ICD-10-CM | POA: Diagnosis not present

## 2021-11-19 DIAGNOSIS — F419 Anxiety disorder, unspecified: Secondary | ICD-10-CM | POA: Diagnosis not present

## 2021-11-19 DIAGNOSIS — Z23 Encounter for immunization: Secondary | ICD-10-CM | POA: Diagnosis not present

## 2022-01-24 DIAGNOSIS — G9341 Metabolic encephalopathy: Secondary | ICD-10-CM | POA: Diagnosis not present

## 2022-01-24 DIAGNOSIS — E782 Mixed hyperlipidemia: Secondary | ICD-10-CM | POA: Diagnosis not present

## 2022-01-24 DIAGNOSIS — Z6824 Body mass index (BMI) 24.0-24.9, adult: Secondary | ICD-10-CM | POA: Diagnosis not present

## 2022-01-24 DIAGNOSIS — F419 Anxiety disorder, unspecified: Secondary | ICD-10-CM | POA: Diagnosis not present

## 2022-01-24 DIAGNOSIS — C099 Malignant neoplasm of tonsil, unspecified: Secondary | ICD-10-CM | POA: Diagnosis not present

## 2022-06-10 DIAGNOSIS — N400 Enlarged prostate without lower urinary tract symptoms: Secondary | ICD-10-CM | POA: Diagnosis not present

## 2022-06-10 DIAGNOSIS — Z6824 Body mass index (BMI) 24.0-24.9, adult: Secondary | ICD-10-CM | POA: Diagnosis not present

## 2022-06-10 DIAGNOSIS — F419 Anxiety disorder, unspecified: Secondary | ICD-10-CM | POA: Diagnosis not present

## 2022-06-10 DIAGNOSIS — E782 Mixed hyperlipidemia: Secondary | ICD-10-CM | POA: Diagnosis not present

## 2022-06-10 DIAGNOSIS — G9341 Metabolic encephalopathy: Secondary | ICD-10-CM | POA: Diagnosis not present

## 2022-06-10 DIAGNOSIS — Z0001 Encounter for general adult medical examination with abnormal findings: Secondary | ICD-10-CM | POA: Diagnosis not present

## 2022-06-10 DIAGNOSIS — C099 Malignant neoplasm of tonsil, unspecified: Secondary | ICD-10-CM | POA: Diagnosis not present

## 2022-06-10 DIAGNOSIS — Z1331 Encounter for screening for depression: Secondary | ICD-10-CM | POA: Diagnosis not present

## 2022-06-10 DIAGNOSIS — F5101 Primary insomnia: Secondary | ICD-10-CM | POA: Diagnosis not present

## 2022-09-30 DIAGNOSIS — F419 Anxiety disorder, unspecified: Secondary | ICD-10-CM | POA: Diagnosis not present

## 2022-09-30 DIAGNOSIS — G9341 Metabolic encephalopathy: Secondary | ICD-10-CM | POA: Diagnosis not present

## 2022-09-30 DIAGNOSIS — G2581 Restless legs syndrome: Secondary | ICD-10-CM | POA: Diagnosis not present

## 2022-09-30 DIAGNOSIS — E782 Mixed hyperlipidemia: Secondary | ICD-10-CM | POA: Diagnosis not present

## 2022-09-30 DIAGNOSIS — Z6824 Body mass index (BMI) 24.0-24.9, adult: Secondary | ICD-10-CM | POA: Diagnosis not present

## 2022-09-30 DIAGNOSIS — Z23 Encounter for immunization: Secondary | ICD-10-CM | POA: Diagnosis not present

## 2022-09-30 DIAGNOSIS — G894 Chronic pain syndrome: Secondary | ICD-10-CM | POA: Diagnosis not present

## 2022-11-17 DIAGNOSIS — Z6824 Body mass index (BMI) 24.0-24.9, adult: Secondary | ICD-10-CM | POA: Diagnosis not present

## 2022-11-17 DIAGNOSIS — G2581 Restless legs syndrome: Secondary | ICD-10-CM | POA: Diagnosis not present

## 2022-11-17 DIAGNOSIS — F419 Anxiety disorder, unspecified: Secondary | ICD-10-CM | POA: Diagnosis not present

## 2023-02-12 DIAGNOSIS — F419 Anxiety disorder, unspecified: Secondary | ICD-10-CM | POA: Diagnosis not present

## 2023-02-12 DIAGNOSIS — Z6822 Body mass index (BMI) 22.0-22.9, adult: Secondary | ICD-10-CM | POA: Diagnosis not present

## 2023-02-12 DIAGNOSIS — F5101 Primary insomnia: Secondary | ICD-10-CM | POA: Diagnosis not present

## 2023-02-12 DIAGNOSIS — G894 Chronic pain syndrome: Secondary | ICD-10-CM | POA: Diagnosis not present

## 2023-02-12 DIAGNOSIS — G2581 Restless legs syndrome: Secondary | ICD-10-CM | POA: Diagnosis not present

## 2023-03-18 ENCOUNTER — Emergency Department (HOSPITAL_COMMUNITY)

## 2023-03-18 ENCOUNTER — Encounter (HOSPITAL_COMMUNITY): Payer: Self-pay | Admitting: *Deleted

## 2023-03-18 ENCOUNTER — Emergency Department (HOSPITAL_COMMUNITY): Admission: EM | Admit: 2023-03-18 | Discharge: 2023-03-18 | Disposition: A | Discharge status: Home / Self Care

## 2023-03-18 ENCOUNTER — Other Ambulatory Visit: Payer: Self-pay

## 2023-03-18 DIAGNOSIS — S199XXA Unspecified injury of neck, initial encounter: Secondary | ICD-10-CM | POA: Diagnosis not present

## 2023-03-18 DIAGNOSIS — R911 Solitary pulmonary nodule: Secondary | ICD-10-CM | POA: Diagnosis not present

## 2023-03-18 DIAGNOSIS — M25512 Pain in left shoulder: Secondary | ICD-10-CM | POA: Diagnosis not present

## 2023-03-18 DIAGNOSIS — R519 Headache, unspecified: Secondary | ICD-10-CM | POA: Diagnosis not present

## 2023-03-18 DIAGNOSIS — R0689 Other abnormalities of breathing: Secondary | ICD-10-CM | POA: Diagnosis not present

## 2023-03-18 DIAGNOSIS — S299XXA Unspecified injury of thorax, initial encounter: Secondary | ICD-10-CM | POA: Diagnosis not present

## 2023-03-18 DIAGNOSIS — S0990XA Unspecified injury of head, initial encounter: Secondary | ICD-10-CM | POA: Diagnosis not present

## 2023-03-18 DIAGNOSIS — M542 Cervicalgia: Secondary | ICD-10-CM | POA: Insufficient documentation

## 2023-03-18 DIAGNOSIS — I700 Atherosclerosis of aorta: Secondary | ICD-10-CM | POA: Diagnosis not present

## 2023-03-18 DIAGNOSIS — M25519 Pain in unspecified shoulder: Secondary | ICD-10-CM | POA: Diagnosis not present

## 2023-03-18 DIAGNOSIS — Z041 Encounter for examination and observation following transport accident: Secondary | ICD-10-CM | POA: Diagnosis not present

## 2023-03-18 DIAGNOSIS — R0789 Other chest pain: Secondary | ICD-10-CM | POA: Diagnosis not present

## 2023-03-18 DIAGNOSIS — Y92410 Unspecified street and highway as the place of occurrence of the external cause: Secondary | ICD-10-CM | POA: Insufficient documentation

## 2023-03-18 DIAGNOSIS — S161XXA Strain of muscle, fascia and tendon at neck level, initial encounter: Secondary | ICD-10-CM

## 2023-03-18 DIAGNOSIS — R55 Syncope and collapse: Secondary | ICD-10-CM | POA: Diagnosis not present

## 2023-03-18 DIAGNOSIS — E049 Nontoxic goiter, unspecified: Secondary | ICD-10-CM | POA: Diagnosis not present

## 2023-03-18 DIAGNOSIS — I6782 Cerebral ischemia: Secondary | ICD-10-CM | POA: Diagnosis not present

## 2023-03-18 LAB — CBC WITH DIFFERENTIAL/PLATELET
Abs Immature Granulocytes: 0.02 10*3/uL (ref 0.00–0.07)
Basophils Absolute: 0.1 10*3/uL (ref 0.0–0.1)
Basophils Relative: 1 %
Eosinophils Absolute: 0.2 10*3/uL (ref 0.0–0.5)
Eosinophils Relative: 3 %
HCT: 44.6 % (ref 39.0–52.0)
Hemoglobin: 14.4 g/dL (ref 13.0–17.0)
Immature Granulocytes: 0 %
Lymphocytes Relative: 11 %
Lymphs Abs: 0.7 10*3/uL (ref 0.7–4.0)
MCH: 30.3 pg (ref 26.0–34.0)
MCHC: 32.3 g/dL (ref 30.0–36.0)
MCV: 93.9 fL (ref 80.0–100.0)
Monocytes Absolute: 0.4 10*3/uL (ref 0.1–1.0)
Monocytes Relative: 7 %
Neutro Abs: 4.6 10*3/uL (ref 1.7–7.7)
Neutrophils Relative %: 78 %
Platelets: 183 10*3/uL (ref 150–400)
RBC: 4.75 MIL/uL (ref 4.22–5.81)
RDW: 14.6 % (ref 11.5–15.5)
WBC: 6 10*3/uL (ref 4.0–10.5)
nRBC: 0 % (ref 0.0–0.2)

## 2023-03-18 LAB — TROPONIN I (HIGH SENSITIVITY)
Troponin I (High Sensitivity): 4 ng/L (ref <18)
Troponin I (High Sensitivity): 4 ng/L (ref <18)

## 2023-03-18 LAB — COMPREHENSIVE METABOLIC PANEL
ALT: 14 U/L (ref 0–44)
AST: 20 U/L (ref 15–41)
Albumin: 4.2 g/dL (ref 3.5–5.0)
Alkaline Phosphatase: 74 U/L (ref 38–126)
Anion gap: 8 (ref 5–15)
BUN: 28 mg/dL — ABNORMAL HIGH (ref 8–23)
CO2: 31 mmol/L (ref 22–32)
Calcium: 9.9 mg/dL (ref 8.9–10.3)
Chloride: 104 mmol/L (ref 98–111)
Creatinine, Ser: 1.11 mg/dL (ref 0.61–1.24)
GFR, Estimated: >60 mL/min (ref >60)
Glucose, Bld: 122 mg/dL — ABNORMAL HIGH (ref 70–99)
Potassium: 3.7 mmol/L (ref 3.5–5.1)
Sodium: 143 mmol/L (ref 135–145)
Total Bilirubin: 0.7 mg/dL (ref 0.0–1.2)
Total Protein: 7.7 g/dL (ref 6.5–8.1)

## 2023-03-18 MED ORDER — IOHEXOL 350 MG/ML SOLN
100.0000 mL | Freq: Once | INTRAVENOUS | Status: AC | PRN
  Administered 2023-03-18: 100 mL via INTRAVENOUS
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
## 2023-03-18 NOTE — ED Notes (Signed)
 RN called [facility] to check on ct scans.

## 2023-03-18 NOTE — Discharge Instructions (Signed)
 I recommend that you follow-up with your primary care provider for recheck.  You may want to apply ice packs on and off to your neck.  Tylenol 4 hours if needed for pain. As discussed, the CT of your chest this evening shows a nodule in your right lung.  This was an incidental finding, but I recommend that you follow back up with your oncologist for recheck.
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
## 2023-03-24 ENCOUNTER — Emergency Department (HOSPITAL_COMMUNITY)

## 2023-03-24 ENCOUNTER — Inpatient Hospital Stay (HOSPITAL_COMMUNITY)

## 2023-03-24 ENCOUNTER — Encounter (HOSPITAL_COMMUNITY): Payer: Self-pay | Admitting: *Deleted

## 2023-03-24 ENCOUNTER — Inpatient Hospital Stay (HOSPITAL_COMMUNITY)
Admission: EM | Admit: 2023-03-24 | Discharge: 2023-03-30 | DRG: 085 | Disposition: A | Discharge status: Home Health | Attending: General Surgery | Admitting: General Surgery

## 2023-03-24 ENCOUNTER — Other Ambulatory Visit: Payer: Self-pay

## 2023-03-24 DIAGNOSIS — H532 Diplopia: Secondary | ICD-10-CM | POA: Diagnosis present

## 2023-03-24 DIAGNOSIS — N390 Urinary tract infection, site not specified: Secondary | ICD-10-CM | POA: Diagnosis not present

## 2023-03-24 DIAGNOSIS — R9431 Abnormal electrocardiogram [ECG] [EKG]: Secondary | ICD-10-CM | POA: Diagnosis not present

## 2023-03-24 DIAGNOSIS — H0520 Unspecified exophthalmos: Secondary | ICD-10-CM | POA: Diagnosis present

## 2023-03-24 DIAGNOSIS — L8990 Pressure ulcer of unspecified site, unspecified stage: Secondary | ICD-10-CM | POA: Insufficient documentation

## 2023-03-24 DIAGNOSIS — Z931 Gastrostomy status: Secondary | ICD-10-CM

## 2023-03-24 DIAGNOSIS — Y92512 Supermarket, store or market as the place of occurrence of the external cause: Secondary | ICD-10-CM | POA: Diagnosis not present

## 2023-03-24 DIAGNOSIS — Z9221 Personal history of antineoplastic chemotherapy: Secondary | ICD-10-CM

## 2023-03-24 DIAGNOSIS — M797 Fibromyalgia: Secondary | ICD-10-CM | POA: Diagnosis not present

## 2023-03-24 DIAGNOSIS — F419 Anxiety disorder, unspecified: Secondary | ICD-10-CM | POA: Diagnosis present

## 2023-03-24 DIAGNOSIS — Z888 Allergy status to other drugs, medicaments and biological substances status: Secondary | ICD-10-CM

## 2023-03-24 DIAGNOSIS — S72144A Nondisplaced intertrochanteric fracture of right femur, initial encounter for closed fracture: Secondary | ICD-10-CM | POA: Diagnosis present

## 2023-03-24 DIAGNOSIS — E1141 Type 2 diabetes mellitus with diabetic mononeuropathy: Secondary | ICD-10-CM | POA: Diagnosis not present

## 2023-03-24 DIAGNOSIS — S0240EA Zygomatic fracture, right side, initial encounter for closed fracture: Secondary | ICD-10-CM | POA: Diagnosis not present

## 2023-03-24 DIAGNOSIS — N210 Calculus in bladder: Secondary | ICD-10-CM | POA: Diagnosis present

## 2023-03-24 DIAGNOSIS — R471 Dysarthria and anarthria: Secondary | ICD-10-CM | POA: Diagnosis present

## 2023-03-24 DIAGNOSIS — N401 Enlarged prostate with lower urinary tract symptoms: Secondary | ICD-10-CM | POA: Diagnosis present

## 2023-03-24 DIAGNOSIS — Z885 Allergy status to narcotic agent status: Secondary | ICD-10-CM

## 2023-03-24 DIAGNOSIS — N35819 Other urethral stricture, male, unspecified site: Secondary | ICD-10-CM | POA: Diagnosis present

## 2023-03-24 DIAGNOSIS — E43 Unspecified severe protein-calorie malnutrition: Secondary | ICD-10-CM | POA: Diagnosis present

## 2023-03-24 DIAGNOSIS — J9601 Acute respiratory failure with hypoxia: Secondary | ICD-10-CM | POA: Diagnosis not present

## 2023-03-24 DIAGNOSIS — S0240CA Maxillary fracture, right side, initial encounter for closed fracture: Secondary | ICD-10-CM | POA: Diagnosis present

## 2023-03-24 DIAGNOSIS — I10 Essential (primary) hypertension: Secondary | ICD-10-CM | POA: Diagnosis present

## 2023-03-24 DIAGNOSIS — B9620 Unspecified Escherichia coli [E. coli] as the cause of diseases classified elsewhere: Secondary | ICD-10-CM | POA: Diagnosis present

## 2023-03-24 DIAGNOSIS — E119 Type 2 diabetes mellitus without complications: Secondary | ICD-10-CM

## 2023-03-24 DIAGNOSIS — M84459A Pathological fracture, hip, unspecified, initial encounter for fracture: Secondary | ICD-10-CM | POA: Diagnosis not present

## 2023-03-24 DIAGNOSIS — Z1152 Encounter for screening for COVID-19: Secondary | ICD-10-CM

## 2023-03-24 DIAGNOSIS — Z85818 Personal history of malignant neoplasm of other sites of lip, oral cavity, and pharynx: Secondary | ICD-10-CM

## 2023-03-24 DIAGNOSIS — Z923 Personal history of irradiation: Secondary | ICD-10-CM | POA: Diagnosis not present

## 2023-03-24 DIAGNOSIS — S02841A Fracture of lateral orbital wall, right side, initial encounter for closed fracture: Secondary | ICD-10-CM | POA: Diagnosis not present

## 2023-03-24 DIAGNOSIS — S020XXA Fracture of vault of skull, initial encounter for closed fracture: Secondary | ICD-10-CM | POA: Diagnosis not present

## 2023-03-24 DIAGNOSIS — R012 Other cardiac sounds: Secondary | ICD-10-CM | POA: Diagnosis not present

## 2023-03-24 DIAGNOSIS — S0990XA Unspecified injury of head, initial encounter: Principal | ICD-10-CM | POA: Diagnosis present

## 2023-03-24 DIAGNOSIS — R58 Hemorrhage, not elsewhere classified: Secondary | ICD-10-CM | POA: Diagnosis not present

## 2023-03-24 DIAGNOSIS — J449 Chronic obstructive pulmonary disease, unspecified: Secondary | ICD-10-CM | POA: Diagnosis not present

## 2023-03-24 DIAGNOSIS — Z85118 Personal history of other malignant neoplasm of bronchus and lung: Secondary | ICD-10-CM

## 2023-03-24 DIAGNOSIS — E860 Dehydration: Secondary | ICD-10-CM | POA: Diagnosis present

## 2023-03-24 DIAGNOSIS — H1131 Conjunctival hemorrhage, right eye: Secondary | ICD-10-CM | POA: Diagnosis present

## 2023-03-24 DIAGNOSIS — R918 Other nonspecific abnormal finding of lung field: Secondary | ICD-10-CM

## 2023-03-24 DIAGNOSIS — R0602 Shortness of breath: Secondary | ICD-10-CM | POA: Diagnosis not present

## 2023-03-24 DIAGNOSIS — S0281XA Fracture of other specified skull and facial bones, right side, initial encounter for closed fracture: Secondary | ICD-10-CM | POA: Diagnosis not present

## 2023-03-24 DIAGNOSIS — M978XXA Periprosthetic fracture around other internal prosthetic joint, initial encounter: Secondary | ICD-10-CM | POA: Diagnosis not present

## 2023-03-24 DIAGNOSIS — S299XXA Unspecified injury of thorax, initial encounter: Secondary | ICD-10-CM | POA: Diagnosis not present

## 2023-03-24 DIAGNOSIS — D62 Acute posthemorrhagic anemia: Secondary | ICD-10-CM | POA: Diagnosis not present

## 2023-03-24 DIAGNOSIS — Z79899 Other long term (current) drug therapy: Secondary | ICD-10-CM

## 2023-03-24 DIAGNOSIS — S0231XA Fracture of orbital floor, right side, initial encounter for closed fracture: Secondary | ICD-10-CM | POA: Diagnosis present

## 2023-03-24 DIAGNOSIS — Z806 Family history of leukemia: Secondary | ICD-10-CM

## 2023-03-24 DIAGNOSIS — S3991XA Unspecified injury of abdomen, initial encounter: Secondary | ICD-10-CM | POA: Diagnosis not present

## 2023-03-24 DIAGNOSIS — R296 Repeated falls: Secondary | ICD-10-CM | POA: Diagnosis not present

## 2023-03-24 DIAGNOSIS — M25551 Pain in right hip: Secondary | ICD-10-CM | POA: Diagnosis not present

## 2023-03-24 DIAGNOSIS — I959 Hypotension, unspecified: Secondary | ICD-10-CM | POA: Diagnosis present

## 2023-03-24 DIAGNOSIS — D696 Thrombocytopenia, unspecified: Secondary | ICD-10-CM | POA: Diagnosis present

## 2023-03-24 DIAGNOSIS — F32A Depression, unspecified: Secondary | ICD-10-CM | POA: Diagnosis present

## 2023-03-24 DIAGNOSIS — T07XXXA Unspecified multiple injuries, initial encounter: Secondary | ICD-10-CM | POA: Diagnosis present

## 2023-03-24 DIAGNOSIS — N400 Enlarged prostate without lower urinary tract symptoms: Secondary | ICD-10-CM | POA: Diagnosis not present

## 2023-03-24 DIAGNOSIS — Z83438 Family history of other disorder of lipoprotein metabolism and other lipidemia: Secondary | ICD-10-CM

## 2023-03-24 DIAGNOSIS — W010XXA Fall on same level from slipping, tripping and stumbling without subsequent striking against object, initial encounter: Secondary | ICD-10-CM | POA: Diagnosis present

## 2023-03-24 DIAGNOSIS — S06330A Contusion and laceration of cerebrum, unspecified, without loss of consciousness, initial encounter: Secondary | ICD-10-CM | POA: Diagnosis not present

## 2023-03-24 DIAGNOSIS — R2681 Unsteadiness on feet: Secondary | ICD-10-CM | POA: Diagnosis present

## 2023-03-24 DIAGNOSIS — J690 Pneumonitis due to inhalation of food and vomit: Secondary | ICD-10-CM | POA: Diagnosis present

## 2023-03-24 DIAGNOSIS — K8020 Calculus of gallbladder without cholecystitis without obstruction: Secondary | ICD-10-CM | POA: Diagnosis not present

## 2023-03-24 DIAGNOSIS — R2689 Other abnormalities of gait and mobility: Secondary | ICD-10-CM | POA: Diagnosis not present

## 2023-03-24 DIAGNOSIS — Z96641 Presence of right artificial hip joint: Secondary | ICD-10-CM | POA: Diagnosis not present

## 2023-03-24 DIAGNOSIS — Z808 Family history of malignant neoplasm of other organs or systems: Secondary | ICD-10-CM

## 2023-03-24 DIAGNOSIS — M9701XA Periprosthetic fracture around internal prosthetic right hip joint, initial encounter: Secondary | ICD-10-CM | POA: Diagnosis not present

## 2023-03-24 DIAGNOSIS — S0181XA Laceration without foreign body of other part of head, initial encounter: Secondary | ICD-10-CM | POA: Diagnosis present

## 2023-03-24 LAB — CBC
HCT: 36.5 % — ABNORMAL LOW (ref 39.0–52.0)
Hemoglobin: 12 g/dL — ABNORMAL LOW (ref 13.0–17.0)
MCH: 30.5 pg (ref 26.0–34.0)
MCHC: 32.9 g/dL (ref 30.0–36.0)
MCV: 92.6 fL (ref 80.0–100.0)
Platelets: 170 10*3/uL (ref 150–400)
RBC: 3.94 MIL/uL — ABNORMAL LOW (ref 4.22–5.81)
RDW: 14.3 % (ref 11.5–15.5)
WBC: 15.5 10*3/uL — ABNORMAL HIGH (ref 4.0–10.5)
nRBC: 0 % (ref 0.0–0.2)

## 2023-03-24 LAB — URINALYSIS, ROUTINE W REFLEX MICROSCOPIC
Bilirubin Urine: NEGATIVE
Glucose, UA: >=500 mg/dL — AB
Hgb urine dipstick: NEGATIVE
Ketones, ur: 20 mg/dL — AB
Nitrite: POSITIVE — AB
Protein, ur: 100 mg/dL — AB
Specific Gravity, Urine: 1.021 (ref 1.005–1.030)
WBC, UA: >50 WBC/hpf (ref 0–5)
pH: 5 (ref 5.0–8.0)

## 2023-03-24 LAB — COMPREHENSIVE METABOLIC PANEL
ALT: 15 U/L (ref 0–44)
AST: 21 U/L (ref 15–41)
Albumin: 4.3 g/dL (ref 3.5–5.0)
Alkaline Phosphatase: 89 U/L (ref 38–126)
Anion gap: 12 (ref 5–15)
BUN: 29 mg/dL — ABNORMAL HIGH (ref 8–23)
CO2: 30 mmol/L (ref 22–32)
Calcium: 9.6 mg/dL (ref 8.9–10.3)
Chloride: 97 mmol/L — ABNORMAL LOW (ref 98–111)
Creatinine, Ser: 1.22 mg/dL (ref 0.61–1.24)
GFR, Estimated: >60 mL/min (ref >60)
Glucose, Bld: 127 mg/dL — ABNORMAL HIGH (ref 70–99)
Potassium: 4 mmol/L (ref 3.5–5.1)
Sodium: 139 mmol/L (ref 135–145)
Total Bilirubin: 0.9 mg/dL (ref 0.0–1.2)
Total Protein: 8 g/dL (ref 6.5–8.1)

## 2023-03-24 LAB — CBC WITH DIFFERENTIAL/PLATELET
Abs Immature Granulocytes: 0.07 10*3/uL (ref 0.00–0.07)
Basophils Absolute: 0 10*3/uL (ref 0.0–0.1)
Basophils Relative: 0 %
Eosinophils Absolute: 0.1 10*3/uL (ref 0.0–0.5)
Eosinophils Relative: 1 %
HCT: 44.9 % (ref 39.0–52.0)
Hemoglobin: 14.6 g/dL (ref 13.0–17.0)
Immature Granulocytes: 1 %
Lymphocytes Relative: 4 %
Lymphs Abs: 0.4 10*3/uL — ABNORMAL LOW (ref 0.7–4.0)
MCH: 30.4 pg (ref 26.0–34.0)
MCHC: 32.5 g/dL (ref 30.0–36.0)
MCV: 93.5 fL (ref 80.0–100.0)
Monocytes Absolute: 0.4 10*3/uL (ref 0.1–1.0)
Monocytes Relative: 4 %
Neutro Abs: 8.1 10*3/uL — ABNORMAL HIGH (ref 1.7–7.7)
Neutrophils Relative %: 90 %
Platelets: 180 10*3/uL (ref 150–400)
RBC: 4.8 MIL/uL (ref 4.22–5.81)
RDW: 14.4 % (ref 11.5–15.5)
WBC: 9 10*3/uL (ref 4.0–10.5)
nRBC: 0 % (ref 0.0–0.2)

## 2023-03-24 LAB — PROTIME-INR
INR: 1.1 (ref 0.8–1.2)
Prothrombin Time: 14.5 s (ref 11.4–15.2)

## 2023-03-24 LAB — APTT: aPTT: 33 s (ref 24–36)

## 2023-03-24 LAB — RESP PANEL BY RT-PCR (RSV, FLU A&B, COVID)  RVPGX2
Influenza A by PCR: NEGATIVE
Influenza B by PCR: NEGATIVE
Resp Syncytial Virus by PCR: NEGATIVE
SARS Coronavirus 2 by RT PCR: NEGATIVE

## 2023-03-24 LAB — LACTIC ACID, PLASMA: Lactic Acid, Venous: 1.8 mmol/L (ref 0.5–1.9)

## 2023-03-24 MED ORDER — DOCUSATE SODIUM 100 MG PO CAPS
100.0000 mg | ORAL_CAPSULE | Freq: Two times a day (BID) | ORAL | Status: DC
  Administered 2023-03-25 – 2023-03-30 (×11): 100 mg via ORAL
  Filled 2023-03-24 (×10): qty 1

## 2023-03-24 MED ORDER — ACETAMINOPHEN 500 MG PO TABS
1000.0000 mg | ORAL_TABLET | Freq: Four times a day (QID) | ORAL | Status: DC
  Administered 2023-03-25 – 2023-03-30 (×17): 1000 mg via ORAL
  Filled 2023-03-24 (×16): qty 2

## 2023-03-24 MED ORDER — ALBUTEROL SULFATE (2.5 MG/3ML) 0.083% IN NEBU: 3.0000 mL | INHALATION_SOLUTION | RESPIRATORY_TRACT | Status: DC | PRN

## 2023-03-24 MED ORDER — CITALOPRAM HYDROBROMIDE 20 MG PO TABS
40.0000 mg | ORAL_TABLET | Freq: Every day | ORAL | Status: DC
  Administered 2023-03-25 – 2023-03-30 (×6): 40 mg via ORAL
  Filled 2023-03-24 (×2): qty 2
  Filled 2023-03-24: qty 4
  Filled 2023-03-24 (×3): qty 2

## 2023-03-24 MED ORDER — OXYCODONE-ACETAMINOPHEN 5-325 MG PO TABS
1.0000 | ORAL_TABLET | Freq: Once | ORAL | Status: AC
  Administered 2023-03-24: 1 via ORAL
  Filled 2023-03-24: qty 1

## 2023-03-24 MED ORDER — SODIUM CHLORIDE 0.9 % IV BOLUS (SEPSIS)
1000.0000 mL | Freq: Once | INTRAVENOUS | Status: AC
  Administered 2023-03-24: 1000 mL via INTRAVENOUS

## 2023-03-24 MED ORDER — MORPHINE SULFATE (PF) 2 MG/ML IV SOLN: 2.0000 mg | INTRAVENOUS | Status: DC | PRN

## 2023-03-24 MED ORDER — ONDANSETRON HCL 4 MG/2ML IJ SOLN
4.0000 mg | Freq: Four times a day (QID) | INTRAMUSCULAR | Status: DC | PRN
Start: 2023-03-24 — End: 2023-03-30

## 2023-03-24 MED ORDER — METHOCARBAMOL 500 MG PO TABS
500.0000 mg | ORAL_TABLET | Freq: Three times a day (TID) | ORAL | Status: AC
  Administered 2023-03-25 – 2023-03-27 (×7): 500 mg via ORAL
  Filled 2023-03-24 (×7): qty 1

## 2023-03-24 MED ORDER — LACTATED RINGERS IV SOLN: INTRAVENOUS | Status: DC

## 2023-03-24 MED ORDER — PREDNISONE 20 MG PO TABS
40.0000 mg | ORAL_TABLET | Freq: Every day | ORAL | Status: DC
  Filled 2023-03-24: qty 2

## 2023-03-24 MED ORDER — SODIUM CHLORIDE 0.9 % IV BOLUS
1000.0000 mL | Freq: Once | INTRAVENOUS | Status: AC
  Administered 2023-03-24: 1000 mL via INTRAVENOUS

## 2023-03-24 MED ORDER — DIPHENHYDRAMINE HCL 25 MG PO CAPS
25.0000 mg | ORAL_CAPSULE | Freq: Four times a day (QID) | ORAL | Status: DC | PRN
  Filled 2023-03-24: qty 1

## 2023-03-24 MED ORDER — ONDANSETRON HCL 4 MG/2ML IJ SOLN
4.0000 mg | Freq: Once | INTRAMUSCULAR | Status: AC
  Administered 2023-03-24: 4 mg via INTRAVENOUS
  Filled 2023-03-24: qty 2

## 2023-03-24 MED ORDER — ALPRAZOLAM 0.5 MG PO TABS: 2.0000 mg | ORAL_TABLET | Freq: Three times a day (TID) | ORAL | Status: DC | PRN

## 2023-03-24 MED ORDER — DOXYCYCLINE HYCLATE 100 MG PO TABS
100.0000 mg | ORAL_TABLET | Freq: Two times a day (BID) | ORAL | Status: DC
Start: 2023-03-25 — End: 2023-03-25
  Administered 2023-03-25: 100 mg via ORAL
  Filled 2023-03-24: qty 1

## 2023-03-24 MED ORDER — TRAMADOL HCL 50 MG PO TABS
50.0000 mg | ORAL_TABLET | Freq: Four times a day (QID) | ORAL | Status: DC | PRN
Start: 2023-03-24 — End: 2023-03-30
  Administered 2023-03-25 – 2023-03-30 (×5): 50 mg via ORAL
  Filled 2023-03-24 (×6): qty 1

## 2023-03-24 MED ORDER — TAMSULOSIN HCL 0.4 MG PO CAPS
0.4000 mg | ORAL_CAPSULE | Freq: Every day | ORAL | Status: DC
  Administered 2023-03-25 – 2023-03-30 (×6): 0.4 mg via ORAL
  Filled 2023-03-24 (×7): qty 1

## 2023-03-24 MED ORDER — LACTATED RINGERS IV SOLN
INTRAVENOUS | Status: AC
Start: 2023-03-24 — End: 2023-03-25

## 2023-03-24 MED ORDER — ROSUVASTATIN CALCIUM 5 MG PO TABS
10.0000 mg | ORAL_TABLET | Freq: Every day | ORAL | Status: DC
  Administered 2023-03-25 – 2023-03-29 (×6): 10 mg via ORAL
  Filled 2023-03-24 (×2): qty 2
  Filled 2023-03-24: qty 1
  Filled 2023-03-24: qty 2
  Filled 2023-03-24: qty 1
  Filled 2023-03-24: qty 2

## 2023-03-24 MED ORDER — POLYETHYLENE GLYCOL 3350 17 G PO PACK: 17.0000 g | PACK | Freq: Every day | ORAL | Status: DC | PRN

## 2023-03-24 MED ORDER — METHOCARBAMOL 1000 MG/10ML IJ SOLN
500.0000 mg | Freq: Three times a day (TID) | INTRAMUSCULAR | Status: AC
  Administered 2023-03-25: 500 mg via INTRAVENOUS
  Filled 2023-03-24: qty 10

## 2023-03-24 MED ORDER — HYDRALAZINE HCL 20 MG/ML IJ SOLN: 10.0000 mg | INTRAMUSCULAR | Status: DC | PRN

## 2023-03-24 MED ORDER — OLANZAPINE 5 MG PO TABS
5.0000 mg | ORAL_TABLET | Freq: Every day | ORAL | Status: DC
  Administered 2023-03-25 – 2023-03-29 (×6): 5 mg via ORAL
  Filled 2023-03-24 (×8): qty 1

## 2023-03-24 MED ORDER — ENOXAPARIN SODIUM 30 MG/0.3ML IJ SOSY
30.0000 mg | PREFILLED_SYRINGE | Freq: Two times a day (BID) | INTRAMUSCULAR | Status: DC
  Administered 2023-03-25 – 2023-03-30 (×11): 30 mg via SUBCUTANEOUS
  Filled 2023-03-24 (×11): qty 0.3

## 2023-03-24 MED ORDER — SODIUM CHLORIDE 0.9 % IV SOLN
2.0000 g | Freq: Once | INTRAVENOUS | Status: AC
  Administered 2023-03-24: 2 g via INTRAVENOUS
  Filled 2023-03-24: qty 20

## 2023-03-24 MED ORDER — ONDANSETRON 4 MG PO TBDP: 4.0000 mg | ORAL_TABLET | Freq: Four times a day (QID) | ORAL | Status: DC | PRN

## 2023-03-24 NOTE — Progress Notes (Signed)
 CODE SEPSIS - PHARMACY COMMUNICATION  **Broad Spectrum Antibiotics should be administered within 1 hour of Sepsis diagnosis**  Time Code Sepsis Called/Page Received: 2016  Antibiotics Ordered: ceftriaxone  Time of 1st antibiotic administration: 2040  Additional action taken by pharmacy: N/A  If necessary, Name of Provider/Nurse Contacted: N/A    Merryl Hacker ,PharmD Clinical Pharmacist  03/24/2023  8:23 PM

## 2023-03-24 NOTE — H&P (Signed)
 Admitting Physician: Hyman Hopes Gianella Chismar  Service: Trauma Surgery  CC: Fall  Subjective   Mechanism of Injury: Niko Penson Waddle is an 73 y.o. male who presented as a trauma transfer from [facility] after a fall.  He tripped and fell landing on his face.  His right hip and right face hurt.  No back pain, no neck pain.  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 Vernie Ammons   GASTROSTOMY TUBE PLACEMENT  04/04/2013   LAPAROSCOPIC GASTROSTOMY N/A 04/04/2013   Procedure: LAPAROSCOPIC GASTROSTOMY TUBE PLACEMENT ;  Surgeon: Axel Filler, MD;  Location: [facility] OR;  Service: General;  Laterality: N/A;   MULTIPLE EXTRACTIONS WITH ALVEOLOPLASTY N/A 04/14/2013   Procedure: Extraction of tooth #'s 1,2,3,4,5,6,7,8,9,10,11,12,13,14,15,17,18,19,20,21,22,23,24,25,26,27,28,29, 30, 31, and 32 with alveoloplasty and bilateral mandibular tori reductions.;  Surgeon: Charlynne Pander, DDS;  Location: [facility] OR;  Service: Oral Surgery;  Laterality: N/A;   NASAL HEMORRHAGE CONTROL N/A 04/04/2013   Procedure: Control of oropharyngeal hemorrhage;  Surgeon: Darletta Moll, MD;  Location: [facility] OR;  Service: ENT;  Laterality: N/A;   PORTACATH PLACEMENT Right 04/04/2013   PORTACATH PLACEMENT N/A 04/04/2013   Procedure: INSERTION PORT-A-CATH;  Surgeon:  Axel Filler, MD;  Location: [facility] OR;  Service: General;  Laterality: N/A;   TOTAL HIP ARTHROPLASTY Right 2000   Dr. Eulah Pont    Family History  Problem Relation Age of Onset   Cancer Cousin        living brain cancer, male   Cancer Mother        Deceased with leukemia, had uterine ca   Hyperlipidemia Brother     Social:  reports that he has never smoked. He has never used smokeless tobacco. He reports that he does not drink alcohol and does not use drugs.  Allergies:  Allergies  Allergen Reactions   Neurontin [Gabapentin] Other (See Comments)    Causes seizures   Buspirone     Lips swelling    Dilaudid [Hydromorphone Hcl] Other (See Comments)    Oxygen saturation drops    Medications: Current Outpatient Medications  Medication Instructions   acetaminophen (TYLENOL) 500-1,000 mg, Oral, Every 4 hours PRN, Reported on 12/27/2014   albuterol (VENTOLIN HFA) 108 (90 Base) MCG/ACT inhaler 1-2 puffs, Inhalation, Every 4 hours PRN   alprazolam (XANAX) 2 mg, 3 times daily PRN   citalopram (CELEXA) 40 MG tablet 1 tablet, Daily   diphenhydrAMINE (BENADRYL) 25 mg, Every 6 hours PRN   doxycycline (VIBRAMYCIN) 100 mg, Oral, 2 times daily   OLANZapine (ZYPREXA) 5 mg, Oral, Daily at bedtime   predniSONE (DELTASONE) 40 mg, Oral, Daily with breakfast   rosuvastatin (CRESTOR) 10 mg,  Daily at bedtime   tamsulosin (FLOMAX) 0.4 mg, Daily    Objective   Primary Survey: Blood pressure 102/67, pulse 93, temperature 98.3 F (36.8 C), temperature source Oral, resp. rate 17, height 6\' 3"  (1.905 m), weight 80.7 kg, SpO2 96%. Airway: Patent, protecting airway Breathing: Bilateral breath sounds, breathing spontaneously Circulation: Stable, Palpable peripheral pulses Disability:  Some limited mobility at right hip ,   GCS Eyes: 4 - Eyes open spontaneously  GCS Verbal: 5 - Oriented  GCS Motor: 6 - Obeys commands for movement  GCS 15 Environment/Exposure: Warm, dry  Secondary Survey: Head:   Right face bruising Neck: Full range of motion without pain, no midline tenderness Chest: Bilateral breath sounds, chest wall stable Abdomen: Soft, non-tender, non-distended Upper Extremities: Strength and sensation intact, palpable peripheral pulses Lower extremities: Strength and sensation intact, palpable peripheral pulses, except pain and weakness right hip, unable to lift leg off bed Back: No step offs or deformities, atraumatic Rectal: Deferred Psych: Normal mood and affect  Results for orders placed or performed during the hospital encounter of 03/24/23 (from the past 24 hours)  CBC with Differential     Status: Abnormal   Collection Time: 03/24/23  1:39 PM  Result Value Ref Range   WBC 9.0 4.0 - 10.5 K/uL   RBC 4.80 4.22 - 5.81 MIL/uL   Hemoglobin 14.6 13.0 - 17.0 g/dL   HCT 16.1 09.6 - 04.5 %   MCV 93.5 80.0 - 100.0 fL   MCH 30.4 26.0 - 34.0 pg   MCHC 32.5 30.0 - 36.0 g/dL   RDW 40.9 81.1 - 91.4 %   Platelets 180 150 - 400 K/uL   nRBC 0.0 0.0 - 0.2 %   Neutrophils Relative % 90 %   Neutro Abs 8.1 (H) 1.7 - 7.7 K/uL   Lymphocytes Relative 4 %   Lymphs Abs 0.4 (L) 0.7 - 4.0 K/uL   Monocytes Relative 4 %   Monocytes Absolute 0.4 0.1 - 1.0 K/uL   Eosinophils Relative 1 %   Eosinophils Absolute 0.1 0.0 - 0.5 K/uL   Basophils Relative 0 %   Basophils Absolute 0.0 0.0 - 0.1 K/uL   Immature Granulocytes 1 %   Abs Immature Granulocytes 0.07 0.00 - 0.07 K/uL  Comprehensive metabolic panel     Status: Abnormal   Collection Time: 03/24/23  1:39 PM  Result Value Ref Range   Sodium 139 135 - 145 mmol/L   Potassium 4.0 3.5 - 5.1 mmol/L   Chloride 97 (L) 98 - 111 mmol/L   CO2 30 22 - 32 mmol/L   Glucose, Bld 127 (H) 70 - 99 mg/dL   BUN 29 (H) 8 - 23 mg/dL   Creatinine, Ser 7.82 0.61 - 1.24 mg/dL   Calcium 9.6 8.9 - 95.6 mg/dL   Total Protein 8.0 6.5 - 8.1 g/dL   Albumin 4.3 3.5 - 5.0 g/dL   AST 21 15 - 41 U/L   ALT 15 0 - 44 U/L   Alkaline Phosphatase 89 38 - 126 U/L    Total Bilirubin 0.9 0.0 - 1.2 mg/dL   GFR, Estimated >21 >30 mL/min   Anion gap 12 5 - 15  Resp panel by RT-PCR (RSV, Flu A&B, Covid) Anterior Nasal Swab     Status: None   Collection Time: 03/24/23  3:55 PM   Specimen: Anterior Nasal Swab  Result Value Ref Range   SARS Coronavirus 2 by RT PCR NEGATIVE NEGATIVE   Influenza A by PCR NEGATIVE  NEGATIVE   Influenza B by PCR NEGATIVE NEGATIVE   Resp Syncytial Virus by PCR NEGATIVE NEGATIVE  Urinalysis, Routine w reflex microscopic -Urine, Clean Catch     Status: Abnormal   Collection Time: 03/24/23  4:45 PM  Result Value Ref Range   Color, Urine YELLOW YELLOW   APPearance HAZY (A) CLEAR   Specific Gravity, Urine 1.021 1.005 - 1.030   pH 5.0 5.0 - 8.0   Glucose, UA >=500 (A) NEGATIVE mg/dL   Hgb urine dipstick NEGATIVE NEGATIVE   Bilirubin Urine NEGATIVE NEGATIVE   Ketones, ur 20 (A) NEGATIVE mg/dL   Protein, ur 962 (A) NEGATIVE mg/dL   Nitrite POSITIVE (A) NEGATIVE   Leukocytes,Ua SMALL (A) NEGATIVE   RBC / HPF 0-5 0 - 5 RBC/hpf   WBC, UA >50 0 - 5 WBC/hpf   Bacteria, UA MANY (A) NONE SEEN   Squamous Epithelial / HPF 0-5 0 - 5 /HPF   Mucus PRESENT   CBC     Status: Abnormal   Collection Time: 03/24/23  7:50 PM  Result Value Ref Range   WBC 15.5 (H) 4.0 - 10.5 K/uL   RBC 3.94 (L) 4.22 - 5.81 MIL/uL   Hemoglobin 12.0 (L) 13.0 - 17.0 g/dL   HCT 95.2 (L) 84.1 - 32.4 %   MCV 92.6 80.0 - 100.0 fL   MCH 30.5 26.0 - 34.0 pg   MCHC 32.9 30.0 - 36.0 g/dL   RDW 40.1 02.7 - 25.3 %   Platelets 170 150 - 400 K/uL   nRBC 0.0 0.0 - 0.2 %  Lactic acid, plasma     Status: None   Collection Time: 03/24/23  8:30 PM  Result Value Ref Range   Lactic Acid, Venous 1.8 0.5 - 1.9 mmol/L  Protime-INR     Status: None   Collection Time: 03/24/23  8:30 PM  Result Value Ref Range   Prothrombin Time 14.5 11.4 - 15.2 seconds   INR 1.1 0.8 - 1.2  APTT     Status: None   Collection Time: 03/24/23  8:30 PM  Result Value Ref Range   aPTT 33 24 - 36  seconds     Imaging Orders         CT Head Wo Contrast         CT Cervical Spine Wo Contrast         CT Maxillofacial Wo Contrast         DG Chest Port 1 View         DG Pelvis 1-2 Views      Assessment and Plan   Wildon Cuevas Amaro is an 73 y.o. male who presented as a trauma transfer after a fall.  Injuries: Right frontal calvarium fracture - no underlying intracranial hemorrhage, pain control Facial fractures - Dr. Jearld Fenton consulted by ER provider Right hip pain - previous replacement, pelvic x-ray and femur pending,  Dispo - Step-down unit    Quentin Ore, MD  [facility] Use [email or web address] to contact on call provider  New Patient Billing: [phone] - Moderate MDM

## 2023-03-24 NOTE — ED Triage Notes (Signed)
 [place]IB [facility] [place]; pt states he tripped up and fell; pt landed on his face and has significant swelling to right eye; pt states he can still see out of eye  Pt has laceration to right eye  Pt denies any loc

## 2023-03-24 NOTE — ED Provider Notes (Signed)
 [place] EMERGENCY DEPARTMENT AT [facility] Provider Note   CSN: [id number] Arrival date & time: 03/24/23  1206     History {Add pertinent medical, surgical, social history, OB history to HPI:1} Chief Complaint  Patient presents with   [name] is a 73 y.o. male.  Patient has a history of COPD and throat cancer.  He is no longer getting treatment for this.  He fell at the store today and hit his face.  Questionable slight loss of consciousness  The history is provided by the patient and a relative. No language interpreter was used.  Fall This is a new problem. The current episode started 1 to 2 hours ago. The problem occurs rarely. The problem has been resolved. Associated symptoms include headaches. Pertinent negatives include no chest pain and no abdominal pain. Nothing aggravates the symptoms. Nothing relieves the symptoms.       Home Medications Prior to Admission medications   Medication Sig Start Date End Date Taking? Authorizing Provider  acetaminophen (TYLENOL) 500 MG tablet Take 500-1,000 mg by mouth every 4 (four) hours as needed for mild pain. Reported on 12/27/2014   Yes [provider]  albuterol (VENTOLIN HFA) 108 (90 Base) MCG/ACT inhaler Inhale 1-2 puffs into the lungs every 4 (four) hours as needed for wheezing or shortness of breath. 07/13/21  Yes Particia Nearing, PA-C  alprazolam Prudy Feeler) 2 MG tablet Take 2 mg by mouth 3 (three) times daily as needed for sleep. 08/02/12  Yes [provider]  citalopram (CELEXA) 40 MG tablet Take 1 tablet by mouth daily. 02/18/17  Yes [provider]  diphenhydrAMINE (BENADRYL) 25 mg capsule Take 25 mg by mouth every 6 (six) hours as needed for sleep.   Yes [provider]  doxycycline (VIBRAMYCIN) 100 MG capsule Take 1 capsule (100 mg total) by mouth 2 (two) times daily. 07/13/21  Yes Particia Nearing, PA-C  OLANZapine (ZYPREXA) 5 MG tablet Take 1 tablet (5 mg total)  by mouth at bedtime. 01/19/18  Yes Tat, Onalee Hua, MD  predniSONE (DELTASONE) 20 MG tablet Take 2 tablets (40 mg total) by mouth daily with breakfast. 07/13/21  Yes Particia Nearing, PA-C  rosuvastatin (CRESTOR) 10 MG tablet Take 10 mg by mouth at bedtime. 03/15/23  Yes [provider]  tamsulosin (FLOMAX) 0.4 MG CAPS capsule Take 0.4 mg by mouth daily.  10/30/17  Yes [provider]      Allergies    Neurontin [gabapentin], Buspirone, and Dilaudid [hydromorphone hcl]    Review of Systems   Review of Systems  Constitutional:  Negative for appetite change and fatigue.  HENT:  Negative for congestion, ear discharge and sinus pressure.   Eyes:  Negative for discharge.  Respiratory:  Negative for cough.   Cardiovascular:  Negative for chest pain.  Gastrointestinal:  Negative for abdominal pain and diarrhea.  Genitourinary:  Negative for frequency and hematuria.  Musculoskeletal:  Negative for back pain.  Skin:  Negative for rash.  Neurological:  Positive for headaches. Negative for seizures.  Psychiatric/Behavioral:  Negative for hallucinations.     Physical Exam Updated Vital Signs BP (!) 191/95   Pulse 80   Resp (!) 24   Ht 6\' 3"  (1.905 m)   Wt 80.7 kg   SpO2 96%   BMI 22.24 kg/m  Physical Exam Vitals and nursing note reviewed.  Constitutional:      Appearance: He is well-developed.  HENT:  Head:     Comments: Swelling around the right orbit.  Patient also has significant bruising to the forehead and right side of his face.  Patient has a laceration above his right eye that has been Dermabond.  Conjunctiva of inflamed pupil reactive.  Patient has difficulty moving his eye to the right    Nose:     Comments: Swollen and tender    Mouth/Throat:     Mouth: Mucous membranes are moist.  Eyes:     General: No scleral icterus.    Conjunctiva/sclera: Conjunctivae normal.  Neck:     Thyroid: No thyromegaly.  Cardiovascular:     Rate and Rhythm: Normal rate and  regular rhythm.     Heart sounds: No murmur heard.    No friction rub. No gallop.  Pulmonary:     Breath sounds: No stridor. No wheezing or rales.  Chest:     Chest wall: No tenderness.  Abdominal:     General: There is no distension.     Tenderness: There is no abdominal tenderness. There is no rebound.  Musculoskeletal:        General: Normal range of motion.     Cervical back: Neck supple.  Lymphadenopathy:     Cervical: No cervical adenopathy.  Skin:    Findings: No erythema or rash.  Neurological:     Mental Status: He is alert and oriented to person, place, and time.     Motor: No abnormal muscle tone.     Coordination: Coordination normal.  Psychiatric:        Behavior: Behavior normal.     ED Results / Procedures / Treatments   Labs (all labs ordered are listed, but only abnormal results are displayed) Labs Reviewed  CBC WITH DIFFERENTIAL/PLATELET - Abnormal; Notable for the following components:      Result Value   Neutro Abs 8.1 (*)    Lymphs Abs 0.4 (*)    All other components within normal limits  COMPREHENSIVE METABOLIC PANEL - Abnormal; Notable for the following components:   Chloride 97 (*)    Glucose, Bld 127 (*)    BUN 29 (*)    All other components within normal limits  RESP PANEL BY RT-PCR (RSV, FLU A&B, COVID)  [id number]    EKG None  Radiology DG Chest Port 1 View Result Date: 03/24/2023 CLINICAL DATA:  Shortness of breath EXAM: PORTABLE CHEST 1 VIEW COMPARISON:  07/13/2021 FINDINGS: Right-sided central venous port tip over the SVC. Hyperinflation. No acute airspace disease, pleural effusion or pneumothorax. Normal cardiomediastinal contour. IMPRESSION: No active disease. Hyperinflation. Electronically Signed   By: Jasmine Pang M.D.   On: 03/24/2023 15:51   CT Head Wo Contrast Result Date: 03/24/2023 CLINICAL DATA:  Provided history: Head trauma, moderate/severe. Facial trauma, blunt. Neck trauma, intoxicated or obtunded. Additional interbody: Fall  (striking face), right eye swelling, right eye laceration. EXAM: CT HEAD WITHOUT CONTRAST CT MAXILLOFACIAL WITHOUT CONTRAST CT CERVICAL SPINE WITHOUT CONTRAST TECHNIQUE: Multidetector CT imaging of the head, cervical spine, and maxillofacial structures were performed using the standard protocol without intravenous contrast. Multiplanar CT image reconstructions of the cervical spine and maxillofacial structures were also generated. RADIATION DOSE REDUCTION: This exam was performed according to the departmental dose-optimization program which includes automated exposure control, adjustment of the mA and/or kV according to patient size and/or use of iterative reconstruction technique. COMPARISON:  Head CT 03/18/2023. Cervical spine CT 03/18/2023. Report from thyroid ultrasound 03/22/2013. FINDINGS: CT HEAD FINDINGS Brain: Generalized cerebral atrophy.  Patchy and ill-defined hypoattenuation within the cerebral white matter, nonspecific but compatible with mild-to-moderate chronic small vessel ischemic disease. There is no acute intracranial hemorrhage. No demarcated cortical infarct. No extra-axial fluid collection. No evidence of an intracranial mass. No midline shift. Vascular: No hyperdense vessel.  Atherosclerotic calcifications. Skull: Acute, comminuted and displaced fracture of the lateral wall of the right bony orbit, extending into the adjacent right sphenoid bone. Adjacent minimally displaced and nondisplaced fractures within the right orbital roof/right frontal calvarium (for instance as seen on maxillofacial CT, image 25) (maxillofacial CT series 4, images 19-21). Additional nondisplaced acute fracture of the right frontal calvarium immediately above the superior orbital rim (for instance as seen on maxillofacial CT series 5, image 11). Other: Right frontal, parietal and temporal scalp hematoma. CT MAXILLOFACIAL FINDINGS Osseous: Acute comminuted and displaced fracture of the lateral wall of the right bony  orbit. A mildly displaced fracture extends from the site into the adjacent right sphenoid bone. Additionally, there are adjacent minimally displaced and nondisplaced fractures within the right orbital roof/right frontal calvarium. Acute comminuted and displaced fracture of the right orbital floor (with depression of fracture fragments by up to 6 mm). Suspected acute, nondisplaced fracture of the right lamina papyracea (for instance as seen on series 9, image 31). Acute comminuted and displaced fractures of the anterior and posterolateral walls of the right maxillary sinus. Acute fractures also extend into the floor of the right maxillary sinus/maxillary alveolar process. No definite acute fracture of the medial wall of the right maxillary sinus is identified. Acute mildly displaced fracture of the right zygomatic arch. Orbits: Acute right orbital fractures as described above. Extensive gas within the right orbit and right periorbital region. Extraconal and intraconal/retrobulbar hemorrhage within the right orbit. Hemorrhage surrounds the right lateral and inferior rectus muscles. Right proptosis. Sinuses: Extensive hyperdense opacification of the right maxillary sinus consistent with the presence of hemorrhage. Mild nonspecific partial opacification of right ethmoid air cells. Soft tissues: Right periorbital and facial hematoma. Extensive soft tissue gas in the right periorbital region and right face. Other: Nonspecific partial opacification of the right nasal passage. CT CERVICAL SPINE FINDINGS Alignment: Dextrocurvature of the cervical spine. No significant spondylolisthesis. Skull base and vertebrae: The basion-dental and atlanto-dental intervals are maintained.No evidence of acute fracture to the cervical spine. C6-C7 vertebral body ankylosis. Soft tissues and spinal canal: No prevertebral fluid or swelling. No visible canal hematoma. Incompletely imaged left thyroid lobe nodule. Per radiology records, this  nodule was previously biopsied on 03/22/2013. Correlate with the pathology report from this prior procedure. Disc levels: Cervical spondylosis with multilevel disc space narrowing, disc bulges/disc protrusions, posterior disc osteophyte complexes, uncovertebral hypertrophy and facet arthropathy. Fusion across the C6-C7 disc space. At the non-fused levels, disc space narrowing is greatest at C5-C6 (advanced at this level). No appreciable high-grade spinal canal stenosis. Multilevel bony neural from narrowing. Degenerative changes also present at the C1-C2 articulation. Upper chest: No acute finding. Incompletely imaged, but known, left upper lobe pulmonary nodule. Attempts are being made to reach the ordering provider at this time. IMPRESSION: CT head: 1.  No evidence of an acute intracranial abnormality. 2. Acute, nondisplaced fracture of the right frontal calvarium (immediately above the right superior orbital rim). Acute orbital/facial fractures with fractures extending into the calvarium as described in the maxillofacial findings/impression section. 3. Right frontal, parietal and temporal scalp hematoma. 4. Parenchymal atrophy and chronic small vessel ischemic disease. CT maxillofacial: 1. Acute comminuted and displaced fracture of the lateral wall of  the right bony orbit. A mildly displaced fracture extends from the site into the adjacent right sphenoid bone. 2. Adjacent minimally displaced and nondisplaced fractures within the right orbital roof/right frontal calvarium. 3. Acute comminuted and displaced fracture of the right orbital floor, extending into the inferior orbital rim (with depression of fracture fragments by up to 6 mm). 4. Suspected acute, nondisplaced fracture of the right lamina papyracea , 5. Extensive acute comminuted and displaced fractures of the anterior and posterolateral walls of the right maxillary sinus. Acute fractures also extend into the floor of the right maxillary sinus/maxillary  alveolar process. 6. Acute mildly displaced fracture of the right zygomatic arch. 7. Extraconal and intraconal/retrobulbar hemorrhage within the right orbit. Hemorrhage surrounds the right lateral and inferior rectus muscles. Correlate with physical exam findings to exclude extraocular muscle entrapment. 8. Extensive gas within the right orbit. 9. Right proptosis. 10. Right periorbital/facial hematoma and subcutaneous gas. CT cervical spine: 1. No evidence of acute cervical spine fracture. 2. Dextrocurvature of the cervical spine. 3. C6-C7 vertebral ankylosis. 4. Cervical spondylosis as described. Electronically Signed   By: Jackey Loge D.O.   On: 03/24/2023 15:13   CT Cervical Spine Wo Contrast Result Date: 03/24/2023 CLINICAL DATA:  Provided history: Head trauma, moderate/severe. Facial trauma, blunt. Neck trauma, intoxicated or obtunded. Additional interbody: Fall (striking face), right eye swelling, right eye laceration. EXAM: CT HEAD WITHOUT CONTRAST CT MAXILLOFACIAL WITHOUT CONTRAST CT CERVICAL SPINE WITHOUT CONTRAST TECHNIQUE: Multidetector CT imaging of the head, cervical spine, and maxillofacial structures were performed using the standard protocol without intravenous contrast. Multiplanar CT image reconstructions of the cervical spine and maxillofacial structures were also generated. RADIATION DOSE REDUCTION: This exam was performed according to the departmental dose-optimization program which includes automated exposure control, adjustment of the mA and/or kV according to patient size and/or use of iterative reconstruction technique. COMPARISON:  Head CT 03/18/2023. Cervical spine CT 03/18/2023. Report from thyroid ultrasound 03/22/2013. FINDINGS: CT HEAD FINDINGS Brain: Generalized cerebral atrophy. Patchy and ill-defined hypoattenuation within the cerebral white matter, nonspecific but compatible with mild-to-moderate chronic small vessel ischemic disease. There is no acute intracranial hemorrhage.  No demarcated cortical infarct. No extra-axial fluid collection. No evidence of an intracranial mass. No midline shift. Vascular: No hyperdense vessel.  Atherosclerotic calcifications. Skull: Acute, comminuted and displaced fracture of the lateral wall of the right bony orbit, extending into the adjacent right sphenoid bone. Adjacent minimally displaced and nondisplaced fractures within the right orbital roof/right frontal calvarium (for instance as seen on maxillofacial CT, image 25) (maxillofacial CT series 4, images 19-21). Additional nondisplaced acute fracture of the right frontal calvarium immediately above the superior orbital rim (for instance as seen on maxillofacial CT series 5, image 11). Other: Right frontal, parietal and temporal scalp hematoma. CT MAXILLOFACIAL FINDINGS Osseous: Acute comminuted and displaced fracture of the lateral wall of the right bony orbit. A mildly displaced fracture extends from the site into the adjacent right sphenoid bone. Additionally, there are adjacent minimally displaced and nondisplaced fractures within the right orbital roof/right frontal calvarium. Acute comminuted and displaced fracture of the right orbital floor (with depression of fracture fragments by up to 6 mm). Suspected acute, nondisplaced fracture of the right lamina papyracea (for instance as seen on series 9, image 31). Acute comminuted and displaced fractures of the anterior and posterolateral walls of the right maxillary sinus. Acute fractures also extend into the floor of the right maxillary sinus/maxillary alveolar process. No definite acute fracture of the medial wall of the  right maxillary sinus is identified. Acute mildly displaced fracture of the right zygomatic arch. Orbits: Acute right orbital fractures as described above. Extensive gas within the right orbit and right periorbital region. Extraconal and intraconal/retrobulbar hemorrhage within the right orbit. Hemorrhage surrounds the right lateral  and inferior rectus muscles. Right proptosis. Sinuses: Extensive hyperdense opacification of the right maxillary sinus consistent with the presence of hemorrhage. Mild nonspecific partial opacification of right ethmoid air cells. Soft tissues: Right periorbital and facial hematoma. Extensive soft tissue gas in the right periorbital region and right face. Other: Nonspecific partial opacification of the right nasal passage. CT CERVICAL SPINE FINDINGS Alignment: Dextrocurvature of the cervical spine. No significant spondylolisthesis. Skull base and vertebrae: The basion-dental and atlanto-dental intervals are maintained.No evidence of acute fracture to the cervical spine. C6-C7 vertebral body ankylosis. Soft tissues and spinal canal: No prevertebral fluid or swelling. No visible canal hematoma. Incompletely imaged left thyroid lobe nodule. Per radiology records, this nodule was previously biopsied on 03/22/2013. Correlate with the pathology report from this prior procedure. Disc levels: Cervical spondylosis with multilevel disc space narrowing, disc bulges/disc protrusions, posterior disc osteophyte complexes, uncovertebral hypertrophy and facet arthropathy. Fusion across the C6-C7 disc space. At the non-fused levels, disc space narrowing is greatest at C5-C6 (advanced at this level). No appreciable high-grade spinal canal stenosis. Multilevel bony neural from narrowing. Degenerative changes also present at the C1-C2 articulation. Upper chest: No acute finding. Incompletely imaged, but known, left upper lobe pulmonary nodule. Attempts are being made to reach the ordering provider at this time. IMPRESSION: CT head: 1.  No evidence of an acute intracranial abnormality. 2. Acute, nondisplaced fracture of the right frontal calvarium (immediately above the right superior orbital rim). Acute orbital/facial fractures with fractures extending into the calvarium as described in the maxillofacial findings/impression section. 3.  Right frontal, parietal and temporal scalp hematoma. 4. Parenchymal atrophy and chronic small vessel ischemic disease. CT maxillofacial: 1. Acute comminuted and displaced fracture of the lateral wall of the right bony orbit. A mildly displaced fracture extends from the site into the adjacent right sphenoid bone. 2. Adjacent minimally displaced and nondisplaced fractures within the right orbital roof/right frontal calvarium. 3. Acute comminuted and displaced fracture of the right orbital floor, extending into the inferior orbital rim (with depression of fracture fragments by up to 6 mm). 4. Suspected acute, nondisplaced fracture of the right lamina papyracea , 5. Extensive acute comminuted and displaced fractures of the anterior and posterolateral walls of the right maxillary sinus. Acute fractures also extend into the floor of the right maxillary sinus/maxillary alveolar process. 6. Acute mildly displaced fracture of the right zygomatic arch. 7. Extraconal and intraconal/retrobulbar hemorrhage within the right orbit. Hemorrhage surrounds the right lateral and inferior rectus muscles. Correlate with physical exam findings to exclude extraocular muscle entrapment. 8. Extensive gas within the right orbit. 9. Right proptosis. 10. Right periorbital/facial hematoma and subcutaneous gas. CT cervical spine: 1. No evidence of acute cervical spine fracture. 2. Dextrocurvature of the cervical spine. 3. C6-C7 vertebral ankylosis. 4. Cervical spondylosis as described. Electronically Signed   By: Jackey Loge D.O.   On: 03/24/2023 15:13   CT Maxillofacial Wo Contrast Result Date: 03/24/2023 CLINICAL DATA:  Provided history: Head trauma, moderate/severe. Facial trauma, blunt. Neck trauma, intoxicated or obtunded. Additional interbody: Fall (striking face), right eye swelling, right eye laceration. EXAM: CT HEAD WITHOUT CONTRAST CT MAXILLOFACIAL WITHOUT CONTRAST CT CERVICAL SPINE WITHOUT CONTRAST TECHNIQUE: Multidetector CT  imaging of the head, cervical spine, and maxillofacial  structures were performed using the standard protocol without intravenous contrast. Multiplanar CT image reconstructions of the cervical spine and maxillofacial structures were also generated. RADIATION DOSE REDUCTION: This exam was performed according to the departmental dose-optimization program which includes automated exposure control, adjustment of the mA and/or kV according to patient size and/or use of iterative reconstruction technique. COMPARISON:  Head CT 03/18/2023. Cervical spine CT 03/18/2023. Report from thyroid ultrasound 03/22/2013. FINDINGS: CT HEAD FINDINGS Brain: Generalized cerebral atrophy. Patchy and ill-defined hypoattenuation within the cerebral white matter, nonspecific but compatible with mild-to-moderate chronic small vessel ischemic disease. There is no acute intracranial hemorrhage. No demarcated cortical infarct. No extra-axial fluid collection. No evidence of an intracranial mass. No midline shift. Vascular: No hyperdense vessel.  Atherosclerotic calcifications. Skull: Acute, comminuted and displaced fracture of the lateral wall of the right bony orbit, extending into the adjacent right sphenoid bone. Adjacent minimally displaced and nondisplaced fractures within the right orbital roof/right frontal calvarium (for instance as seen on maxillofacial CT, image 25) (maxillofacial CT series 4, images 19-21). Additional nondisplaced acute fracture of the right frontal calvarium immediately above the superior orbital rim (for instance as seen on maxillofacial CT series 5, image 11). Other: Right frontal, parietal and temporal scalp hematoma. CT MAXILLOFACIAL FINDINGS Osseous: Acute comminuted and displaced fracture of the lateral wall of the right bony orbit. A mildly displaced fracture extends from the site into the adjacent right sphenoid bone. Additionally, there are adjacent minimally displaced and nondisplaced fractures within the  right orbital roof/right frontal calvarium. Acute comminuted and displaced fracture of the right orbital floor (with depression of fracture fragments by up to 6 mm). Suspected acute, nondisplaced fracture of the right lamina papyracea (for instance as seen on series 9, image 31). Acute comminuted and displaced fractures of the anterior and posterolateral walls of the right maxillary sinus. Acute fractures also extend into the floor of the right maxillary sinus/maxillary alveolar process. No definite acute fracture of the medial wall of the right maxillary sinus is identified. Acute mildly displaced fracture of the right zygomatic arch. Orbits: Acute right orbital fractures as described above. Extensive gas within the right orbit and right periorbital region. Extraconal and intraconal/retrobulbar hemorrhage within the right orbit. Hemorrhage surrounds the right lateral and inferior rectus muscles. Right proptosis. Sinuses: Extensive hyperdense opacification of the right maxillary sinus consistent with the presence of hemorrhage. Mild nonspecific partial opacification of right ethmoid air cells. Soft tissues: Right periorbital and facial hematoma. Extensive soft tissue gas in the right periorbital region and right face. Other: Nonspecific partial opacification of the right nasal passage. CT CERVICAL SPINE FINDINGS Alignment: Dextrocurvature of the cervical spine. No significant spondylolisthesis. Skull base and vertebrae: The basion-dental and atlanto-dental intervals are maintained.No evidence of acute fracture to the cervical spine. C6-C7 vertebral body ankylosis. Soft tissues and spinal canal: No prevertebral fluid or swelling. No visible canal hematoma. Incompletely imaged left thyroid lobe nodule. Per radiology records, this nodule was previously biopsied on 03/22/2013. Correlate with the pathology report from this prior procedure. Disc levels: Cervical spondylosis with multilevel disc space narrowing, disc  bulges/disc protrusions, posterior disc osteophyte complexes, uncovertebral hypertrophy and facet arthropathy. Fusion across the C6-C7 disc space. At the non-fused levels, disc space narrowing is greatest at C5-C6 (advanced at this level). No appreciable high-grade spinal canal stenosis. Multilevel bony neural from narrowing. Degenerative changes also present at the C1-C2 articulation. Upper chest: No acute finding. Incompletely imaged, but known, left upper lobe pulmonary nodule. Attempts are being made to reach  the ordering provider at this time. IMPRESSION: CT head: 1.  No evidence of an acute intracranial abnormality. 2. Acute, nondisplaced fracture of the right frontal calvarium (immediately above the right superior orbital rim). Acute orbital/facial fractures with fractures extending into the calvarium as described in the maxillofacial findings/impression section. 3. Right frontal, parietal and temporal scalp hematoma. 4. Parenchymal atrophy and chronic small vessel ischemic disease. CT maxillofacial: 1. Acute comminuted and displaced fracture of the lateral wall of the right bony orbit. A mildly displaced fracture extends from the site into the adjacent right sphenoid bone. 2. Adjacent minimally displaced and nondisplaced fractures within the right orbital roof/right frontal calvarium. 3. Acute comminuted and displaced fracture of the right orbital floor, extending into the inferior orbital rim (with depression of fracture fragments by up to 6 mm). 4. Suspected acute, nondisplaced fracture of the right lamina papyracea , 5. Extensive acute comminuted and displaced fractures of the anterior and posterolateral walls of the right maxillary sinus. Acute fractures also extend into the floor of the right maxillary sinus/maxillary alveolar process. 6. Acute mildly displaced fracture of the right zygomatic arch. 7. Extraconal and intraconal/retrobulbar hemorrhage within the right orbit. Hemorrhage surrounds the right  lateral and inferior rectus muscles. Correlate with physical exam findings to exclude extraocular muscle entrapment. 8. Extensive gas within the right orbit. 9. Right proptosis. 10. Right periorbital/facial hematoma and subcutaneous gas. CT cervical spine: 1. No evidence of acute cervical spine fracture. 2. Dextrocurvature of the cervical spine. 3. C6-C7 vertebral ankylosis. 4. Cervical spondylosis as described. Electronically Signed   By: Jackey Loge D.O.   On: 03/24/2023 15:13    Procedures Procedures  {Document cardiac monitor, telemetry assessment procedure when appropriate:1}  Medications Ordered in ED Medications  oxyCODONE-acetaminophen (PERCOCET/ROXICET) 5-325 MG per tablet 1 tablet (1 tablet Oral Given 03/24/23 1507)  ondansetron (ZOFRAN) injection 4 mg (4 mg Intravenous Given 03/24/23 1510)    ED Course/ Medical Decision Making/ A&P   {Patient with numerous facial fractures along with a calvarium fracture and orbital fractures.  CT of the head parenchyma was unremarkable.  I spoke with Dr. Jearld Fenton and he will see the patient at [facility].  Patient also has been having some hypoxia with a normal chest x-ray.  I suspect his COPD and swelling to his nose and mouth is making it more difficult for him.    I spoke with Dr. Violeta Gelinas and he will accept the patient to be admitted for [facility] Click here for ABCD2, HEART and other calculatorsREFRESH Note before signing :1}                              Medical Decision Making Amount and/or Complexity of Data Reviewed Labs: ordered. Radiology: ordered.  Risk Prescription drug management. Decision regarding hospitalization.   Fall with head injury and multiple facial fractures  {Document critical care time when appropriate:1} {Document review of labs and clinical decision tools ie heart score, Chads2Vasc2 etc:1}  {Document your independent review of radiology images, and any outside records:1} {Document your  discussion with family members, caretakers, and with consultants:1} {Document social determinants of health affecting pt's care:1} {Document your decision making why or why not admission, treatments were needed:1} Final Clinical Impression(s) / ED Diagnoses Final diagnoses:  None    Rx / DC Orders ED Discharge Orders     None

## 2023-03-24 NOTE — ED Notes (Signed)
 O2 saturation suddenly rebounded to 96%. NRB removed. Pt still has no complaints.

## 2023-03-24 NOTE — ED Provider Notes (Signed)
  Physical Exam  BP 96/61   Pulse 82   Temp 98.1 F (36.7 C) (Oral)   Resp 11   Ht 6\' 3"  (1.905 m)   Wt 80.7 kg   SpO2 94%   BMI 22.24 kg/m   Physical Exam  Procedures  .Critical Care  Performed by: Derwood Kaplan, MD Authorized by: Derwood Kaplan, MD   Critical care provider statement:    Critical care time (minutes):  41   Critical care was necessary to treat or prevent imminent or life-threatening deterioration of the following conditions:  Trauma, circulatory failure and sepsis   Critical care was time spent personally by me on the following activities:  Development of treatment plan with patient or surrogate, discussions with consultants, evaluation of patient's response to treatment, examination of patient, ordering and review of laboratory studies, ordering and review of radiographic studies, ordering and performing treatments and interventions, pulse oximetry, re-evaluation of patient's condition and review of old charts   ED Course / MDM    Medical Decision Making Amount and/or Complexity of Data Reviewed Labs: ordered. Radiology: ordered.  Risk Prescription drug management.   Patient had come into the ER several hours ago after he had a fall. Patient [place].   Patient was found to have facial fractures.  Patient was accepted by trauma service, and ENT was also consulted.  I was made aware of low blood pressure just few minutes ago.  I went and assessed the patient.  He has no complaints from his side.  His abdomen is soft and nontender.  He has no chest pain, shortness of breath.  Lung exam is clear.  Patient is moving his lower extremity without any discomfort.  No pelvis enlargement noted.  Patient is not on any blood thinners.  Reviewed labs - elevated WC and nitrite positive urine. Additionally, patient states that he self cath.  Patient informs me that he thinks he might of fainted, which is why he fell.  I suspect that the  underlying cause for his fall and low blood pressure is medical and not trauma.  I repaged trauma service and spoke with Dr. Ollen Gross.  I informed him of patient's status, low blood pressure and my exam findings.  I asked him if he wanted me to blunt trauma the patient.  He agrees, based on my presentation that most likely patient's underlying cause is medical and not trauma for the low blood pressure.  He does not think the CTs will add value.  He does recommend prompt ER to ER transfer.  I will activate code sepsis. Patient will receive 2 L of IV fluid in the ER.  We will also give him IV Rocephin.   Date: 03/24/2023  Rate: 86  Rhythm: normal sinus rhythm  QRS Axis: normal  Intervals: normal  ST/T Wave abnormalities: normal  Conduction Disutrbances: none  Narrative Interpretation: unremarkable   Dr. Hyacinth Meeker at [facility] excepting the patient.   Derwood Kaplan, MD 03/24/23 2022

## 2023-03-24 NOTE — ED Notes (Signed)
 Pt returned from CT with RA sats 75-77%, [place] applied at 3 and 4 Liters per min with minimal increase ( 80%),  NRB applied at 15 L/M and EDP made aware.

## 2023-03-24 NOTE — ED Notes (Signed)
 Pt returned from CT

## 2023-03-24 NOTE — Sepsis Progress Note (Signed)
 Elink monitoring for the code sepsis protocol.

## 2023-03-24 NOTE — ED Provider Notes (Signed)
 Sent from [facility] for trauma admission with multiple facial/skull fractures.  See prior team's note for full HPI.  Complaining of hip pain, does not appear to had pelvic x-ray done.  Neurovascularly intact.  Will add on pelvic x-ray.  Discussed case with trauma team, to let them know patient is here as Dr. Laurell Josephs has accepted patient. They will come see patient.    Coral Spikes, DO 03/24/23 2249

## 2023-03-24 NOTE — ED Notes (Signed)
 Pt w/ transient periods of low oxygen saturation. No complaints of shortness of breath from patient. O2 saturation returns to normal with out intervention. 2lpm [place] on patient.

## 2023-03-24 NOTE — ED Notes (Signed)
 ED Provider at bedside (nanavati)

## 2023-03-24 NOTE — Progress Notes (Signed)
 Transition of Care [facility]) - CAGE-AID Screening   Patient Details  Name: [name] MRN: [id number] Date of Birth: 1950/07/28  [facility]) CM/SW Contact:    Leota Sauers, RN Phone Number: 03/24/2023, 11:59 PM   Clinical Narrative:  Patient denies the use of alcohol and illicit substances. Resources not given at this time.  CAGE-AID Screening:    Have You Ever Felt You Ought to Cut Down on Your Drinking or Drug Use?: No Have People Annoyed You By Critizing Your Drinking Or Drug Use?: No Have You Felt Bad Or Guilty About Your Drinking Or Drug Use?: No Have You Ever Had a Drink or Used Drugs First Thing In The Morning to Steady Your Nerves or to Get Rid of a Hangover?: No CAGE-AID Score: 0  Substance Abuse Education Offered: No

## 2023-03-24 NOTE — ED Notes (Signed)
 Brooklyn Sharma Covert (granddaughter) would like to be updated when patient is transferred.

## 2023-03-24 NOTE — ED Notes (Signed)
 Pt arrived via [facility] from [facility]. Pt a/o to baseline, no new complaints at this time.

## 2023-03-25 ENCOUNTER — Inpatient Hospital Stay (HOSPITAL_COMMUNITY)

## 2023-03-25 DIAGNOSIS — E119 Type 2 diabetes mellitus without complications: Secondary | ICD-10-CM | POA: Diagnosis not present

## 2023-03-25 DIAGNOSIS — J690 Pneumonitis due to inhalation of food and vomit: Secondary | ICD-10-CM

## 2023-03-25 DIAGNOSIS — N390 Urinary tract infection, site not specified: Secondary | ICD-10-CM

## 2023-03-25 DIAGNOSIS — J9601 Acute respiratory failure with hypoxia: Secondary | ICD-10-CM

## 2023-03-25 DIAGNOSIS — T07XXXA Unspecified multiple injuries, initial encounter: Secondary | ICD-10-CM | POA: Diagnosis not present

## 2023-03-25 LAB — CBC WITH DIFFERENTIAL/PLATELET
Abs Immature Granulocytes: 0.06 10*3/uL (ref 0.00–0.07)
Basophils Absolute: 0.1 10*3/uL (ref 0.0–0.1)
Basophils Relative: 0 %
Eosinophils Absolute: 0.2 10*3/uL (ref 0.0–0.5)
Eosinophils Relative: 2 %
HCT: 27.6 % — ABNORMAL LOW (ref 39.0–52.0)
Hemoglobin: 9.1 g/dL — ABNORMAL LOW (ref 13.0–17.0)
Immature Granulocytes: 1 %
Lymphocytes Relative: 4 %
Lymphs Abs: 0.5 10*3/uL — ABNORMAL LOW (ref 0.7–4.0)
MCH: 30.8 pg (ref 26.0–34.0)
MCHC: 33 g/dL (ref 30.0–36.0)
MCV: 93.6 fL (ref 80.0–100.0)
Monocytes Absolute: 0.7 10*3/uL (ref 0.1–1.0)
Monocytes Relative: 6 %
Neutro Abs: 10.1 10*3/uL — ABNORMAL HIGH (ref 1.7–7.7)
Neutrophils Relative %: 87 %
Platelets: 119 10*3/uL — ABNORMAL LOW (ref 150–400)
RBC: 2.95 MIL/uL — ABNORMAL LOW (ref 4.22–5.81)
RDW: 14.5 % (ref 11.5–15.5)
WBC: 11.5 10*3/uL — ABNORMAL HIGH (ref 4.0–10.5)
nRBC: 0 % (ref 0.0–0.2)

## 2023-03-25 LAB — CBC
HCT: 32.4 % — ABNORMAL LOW (ref 39.0–52.0)
Hemoglobin: 9.5 g/dL — ABNORMAL LOW (ref 13.0–17.0)
MCH: 30.3 pg (ref 26.0–34.0)
MCHC: 29.3 g/dL — ABNORMAL LOW (ref 30.0–36.0)
MCV: 103.2 fL — ABNORMAL HIGH (ref 80.0–100.0)
Platelets: 119 10*3/uL — ABNORMAL LOW (ref 150–400)
RBC: 3.14 MIL/uL — ABNORMAL LOW (ref 4.22–5.81)
RDW: 14.4 % (ref 11.5–15.5)
WBC: 12.7 10*3/uL — ABNORMAL HIGH (ref 4.0–10.5)
nRBC: 0 % (ref 0.0–0.2)

## 2023-03-25 LAB — TROPONIN I (HIGH SENSITIVITY)
Troponin I (High Sensitivity): 12 ng/L (ref <18)
Troponin I (High Sensitivity): 10 ng/L (ref <18)

## 2023-03-25 LAB — GLUCOSE, CAPILLARY
Glucose-Capillary: 87 mg/dL (ref 70–99)
Glucose-Capillary: 97 mg/dL (ref 70–99)
Glucose-Capillary: 104 mg/dL — ABNORMAL HIGH (ref 70–99)
Glucose-Capillary: 104 mg/dL — ABNORMAL HIGH (ref 70–99)

## 2023-03-25 LAB — CREATININE, SERUM
Creatinine, Ser: 1.09 mg/dL (ref 0.61–1.24)
GFR, Estimated: >60 mL/min (ref >60)

## 2023-03-25 LAB — BASIC METABOLIC PANEL
Anion gap: 6 (ref 5–15)
BUN: 28 mg/dL — ABNORMAL HIGH (ref 8–23)
CO2: 26 mmol/L (ref 22–32)
Calcium: 7.9 mg/dL — ABNORMAL LOW (ref 8.9–10.3)
Chloride: 107 mmol/L (ref 98–111)
Creatinine, Ser: 1.01 mg/dL (ref 0.61–1.24)
GFR, Estimated: >60 mL/min (ref >60)
Glucose, Bld: 110 mg/dL — ABNORMAL HIGH (ref 70–99)
Potassium: 4.1 mmol/L (ref 3.5–5.1)
Sodium: 139 mmol/L (ref 135–145)

## 2023-03-25 LAB — HEMOGLOBIN A1C
Hgb A1c MFr Bld: 5.3 % (ref 4.8–5.6)
Mean Plasma Glucose: 105.41 mg/dL

## 2023-03-25 LAB — LACTIC ACID, PLASMA: Lactic Acid, Venous: 1.3 mmol/L (ref 0.5–1.9)

## 2023-03-25 MED ORDER — LACTATED RINGERS IV BOLUS
1000.0000 mL | Freq: Once | INTRAVENOUS | Status: AC
  Administered 2023-03-25: 1000 mL via INTRAVENOUS

## 2023-03-25 MED ORDER — PIPERACILLIN-TAZOBACTAM 3.375 G IVPB
3.3750 g | Freq: Three times a day (TID) | INTRAVENOUS | Status: DC
  Administered 2023-03-25 – 2023-03-27 (×7): 3.375 g via INTRAVENOUS
  Filled 2023-03-25 (×7): qty 50

## 2023-03-25 MED ORDER — INSULIN ASPART 100 UNIT/ML IJ SOLN: 0.0000 [IU] | Freq: Three times a day (TID) | INTRAMUSCULAR | Status: DC

## 2023-03-25 MED ORDER — NOREPINEPHRINE 4 MG/250ML-% IV SOLN
0.0000 ug/min | INTRAVENOUS | Status: DC
  Administered 2023-03-25: 2 ug/min via INTRAVENOUS
  Administered 2023-03-25: 1 ug/min via INTRAVENOUS

## 2023-03-25 MED ORDER — VANCOMYCIN HCL IN DEXTROSE 1-5 GM/200ML-% IV SOLN: 1000.0000 mg | Freq: Two times a day (BID) | INTRAVENOUS | Status: DC

## 2023-03-25 MED ORDER — IOHEXOL 350 MG/ML SOLN
75.0000 mL | Freq: Once | INTRAVENOUS | Status: AC | PRN
  Administered 2023-03-25: 75 mL via INTRAVENOUS

## 2023-03-25 MED ORDER — ERYTHROMYCIN 5 MG/GM OP OINT
TOPICAL_OINTMENT | Freq: Three times a day (TID) | OPHTHALMIC | Status: DC
  Administered 2023-03-25 – 2023-03-30 (×14): 1 via OPHTHALMIC
  Filled 2023-03-25: qty 1
  Filled 2023-03-25: qty 3.5
  Filled 2023-03-25 (×2): qty 1

## 2023-03-25 MED ORDER — SODIUM CHLORIDE 0.9 % IV SOLN: 2.0000 g | INTRAVENOUS | Status: DC

## 2023-03-25 MED ORDER — MORPHINE SULFATE (PF) 2 MG/ML IV SOLN: 1.0000 mg | INTRAVENOUS | Status: DC | PRN

## 2023-03-25 MED ORDER — VANCOMYCIN HCL 1750 MG/350ML IV SOLN
1750.0000 mg | Freq: Once | INTRAVENOUS | Status: AC
  Administered 2023-03-25: 1750 mg via INTRAVENOUS
  Filled 2023-03-25: qty 350

## 2023-03-25 MED ORDER — VANCOMYCIN HCL IN DEXTROSE 1-5 GM/200ML-% IV SOLN
1000.0000 mg | Freq: Two times a day (BID) | INTRAVENOUS | Status: DC
  Administered 2023-03-25 – 2023-03-26 (×2): 1000 mg via INTRAVENOUS
  Filled 2023-03-25 (×3): qty 200

## 2023-03-25 MED ORDER — PIPERACILLIN-TAZOBACTAM 3.375 G IVPB 30 MIN: 3.3750 g | Freq: Three times a day (TID) | INTRAVENOUS | Status: DC

## 2023-03-25 NOTE — Consult Note (Signed)
 Reason for Consult:orbital fx Referring Physician: Dr Karie Georges Joseph [name] is an 73 y.o. [name].  HPI: hx of fall and has orbital fracture in medial lateral and inferior walls. Joseph [name] had some diplopia but better this AM. Joseph [name] has had opthal consult. 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 Vernie Ammons   GASTROSTOMY TUBE PLACEMENT  04/04/2013   LAPAROSCOPIC GASTROSTOMY N/A 04/04/2013   Procedure: LAPAROSCOPIC GASTROSTOMY TUBE PLACEMENT ;  Surgeon: Axel Filler, MD;  Location: [facility] OR;  Service: General;  Laterality: N/A;   MULTIPLE EXTRACTIONS WITH ALVEOLOPLASTY N/A 04/14/2013   Procedure: Extraction of tooth #'s 1,2,3,4,5,6,7,8,9,10,11,12,13,14,15,17,18,19,20,21,22,23,24,25,26,27,28,29, 30, 31, and 32 with alveoloplasty and bilateral mandibular tori reductions.;  Surgeon: Charlynne Pander, DDS;  Location: [facility] OR;  Service: Oral Surgery;  Laterality: N/A;   NASAL HEMORRHAGE CONTROL N/A 04/04/2013   Procedure: Control of oropharyngeal hemorrhage;  Surgeon: Darletta Moll, MD;  Location: [facility] OR;  Service: ENT;  Laterality: N/A;   PORTACATH PLACEMENT Right 04/04/2013   PORTACATH PLACEMENT N/A 04/04/2013   Procedure: INSERTION PORT-A-CATH;  Surgeon: Axel Filler, MD;  Location: [facility] OR;  Service:  General;  Laterality: N/A;   TOTAL HIP ARTHROPLASTY Right 2000   Dr. Eulah Pont    Family History  Problem Relation Age of Onset   Cancer Cousin        living brain cancer, [name]   Cancer Mother        Deceased with leukemia, had uterine ca   Hyperlipidemia Brother     Social History:  reports that Joseph [name] has never smoked. Joseph [name] has never used smokeless tobacco. Joseph [name] reports that Joseph [name] does not drink alcohol and does not use drugs.  Allergies:  Allergies  Allergen Reactions   Neurontin [Gabapentin] Other (See Comments)    Causes seizures   Buspirone     Lips swelling    Dilaudid [Hydromorphone Hcl] Other (See Comments)    Oxygen saturation drops    Medications: I have reviewed the patient's current medications.  Results for orders placed or performed during the hospital encounter of 03/24/23 (from the past 48 hours)  CBC with Differential     Status: Abnormal   Collection Time: 03/24/23  1:39 PM  Result Value Ref Range   WBC 9.0 4.0 - 10.5 K/uL   RBC 4.80 4.22 - 5.81 MIL/uL   Hemoglobin 14.6 13.0 - 17.0 g/dL   HCT 16.1 09.6 - 04.5 %   MCV 93.5 80.0 - 100.0 fL   MCH 30.4 26.0 - 34.0 pg   MCHC 32.5 30.0 - 36.0 g/dL   RDW 40.9 81.1 - 91.4 %   Platelets 180  150 - 400 K/uL   nRBC 0.0 0.0 - 0.2 %   Neutrophils Relative % 90 %   Neutro Abs 8.1 (H) 1.7 - 7.7 K/uL   Lymphocytes Relative 4 %   Lymphs Abs 0.4 (L) 0.7 - 4.0 K/uL   Monocytes Relative 4 %   Monocytes Absolute 0.4 0.1 - 1.0 K/uL   Eosinophils Relative 1 %   Eosinophils Absolute 0.1 0.0 - 0.5 K/uL   Basophils Relative 0 %   Basophils Absolute 0.0 0.0 - 0.1 K/uL   Immature Granulocytes 1 %   Abs Immature Granulocytes 0.07 0.00 - 0.07 K/uL    Comment: Performed at [facility], [address] [place], [place] [address]  Comprehensive metabolic panel     Status: Abnormal   Collection Time: 03/24/23  1:39 PM  Result Value Ref Range   Sodium 139 135 - 145 mmol/L   Potassium 4.0 3.5 - 5.1 mmol/L   Chloride 97 (L) 98 - 111  mmol/L   CO2 30 22 - 32 mmol/L   Glucose, Bld 127 (H) 70 - 99 mg/dL    Comment: Glucose reference range applies only to samples taken after fasting for at least 8 hours.   BUN 29 (H) 8 - 23 mg/dL   Creatinine, Ser 0.86 0.61 - 1.24 mg/dL   Calcium 9.6 8.9 - 57.8 mg/dL   Total Protein 8.0 6.5 - 8.1 g/dL   Albumin 4.3 3.5 - 5.0 g/dL   AST 21 15 - 41 U/L   ALT 15 0 - 44 U/L   Alkaline Phosphatase Joseph 38 - 126 U/L   Total Bilirubin 0.9 0.0 - 1.2 mg/dL   GFR, Estimated >46 >96 mL/min    Comment: (NOTE) Calculated using the CKD-EPI Creatinine Equation (2021)    Anion gap 12 5 - 15    Comment: Performed at [facility], [address] [place], [place] [address]  Resp panel by RT-PCR (RSV, Flu A&B, Covid) Anterior Nasal Swab     Status: None   Collection Time: 03/24/23  3:55 PM   Specimen: Anterior Nasal Swab  Result Value Ref Range   SARS Coronavirus 2 by RT PCR NEGATIVE NEGATIVE    Comment: (NOTE) SARS-CoV-2 target nucleic acids are NOT DETECTED.  The SARS-CoV-2 RNA is generally detectable in upper respiratory specimens during the acute phase of infection. The lowest concentration of SARS-CoV-2 viral copies this assay can detect is 138 copies/mL. A negative result does not preclude SARS-Cov-2 infection and should not be used as the sole basis for treatment or other patient management decisions. A negative result may occur with  improper specimen collection/handling, submission of specimen other than nasopharyngeal swab, presence of viral mutation(s) within the areas targeted by this assay, and inadequate number of viral copies(<138 copies/mL). A negative result must be combined with clinical observations, patient history, and epidemiological information. The expected result is Negative.  Fact Sheet for Patients:  BloggerCourse.com  Fact Sheet for Healthcare Providers:  SeriousBroker.it  This test is no t yet approved or cleared by  the [place] FDA and  has been authorized for detection and/or diagnosis of SARS-CoV-2 by FDA under an Emergency Use Authorization (EUA). This EUA will remain  in effect (meaning this test can be used) for the duration of the COVID-19 declaration under Section 564(b)(1) of the Act, 21 U.S.C.section 360bbb-3(b)(1), unless the authorization is terminated  or revoked sooner.       Influenza A by PCR NEGATIVE NEGATIVE   Influenza B by  PCR NEGATIVE NEGATIVE    Comment: (NOTE) The Xpert Xpress SARS-CoV-2/FLU/RSV plus assay is intended as an aid in the diagnosis of influenza from Nasopharyngeal swab specimens and should not be used as a sole basis for treatment. Nasal washings and aspirates are unacceptable for Xpert Xpress SARS-CoV-2/FLU/RSV testing.  Fact Sheet for Patients: BloggerCourse.com  Fact Sheet for Healthcare Providers: SeriousBroker.it  This test is not yet approved or cleared by the [place] FDA and has been authorized for detection and/or diagnosis of SARS-CoV-2 by FDA under an Emergency Use Authorization (EUA). This EUA will remain in effect (meaning this test can be used) for the duration of the COVID-19 declaration under Section 564(b)(1) of the Act, 21 U.S.C. section 360bbb-3(b)(1), unless the authorization is terminated or revoked.     Resp Syncytial Virus by PCR NEGATIVE NEGATIVE    Comment: (NOTE) Fact Sheet for Patients: BloggerCourse.com  Fact Sheet for Healthcare Providers: SeriousBroker.it  This test is not yet approved or cleared by the [place] FDA and has been authorized for detection and/or diagnosis of SARS-CoV-2 by FDA under an Emergency Use Authorization (EUA). This EUA will remain in effect (meaning this test can be used) for the duration of the COVID-19 declaration under Section 564(b)(1) of the Act, 21 U.S.C. section  360bbb-3(b)(1), unless the authorization is terminated or revoked.  Performed at [facility], [address], [place], [place] [address]   Urinalysis, Routine w reflex microscopic -Urine, Clean Catch     Status: Abnormal   Collection Time: 03/24/23  4:45 PM  Result Value Ref Range   Color, Urine YELLOW YELLOW   APPearance HAZY (A) CLEAR   Specific Gravity, Urine 1.021 1.005 - 1.030   pH 5.0 5.0 - 8.0   Glucose, UA >=500 (A) NEGATIVE mg/dL   Hgb urine dipstick NEGATIVE NEGATIVE   Bilirubin Urine NEGATIVE NEGATIVE   Ketones, ur 20 (A) NEGATIVE mg/dL   Protein, ur 604 (A) NEGATIVE mg/dL   Nitrite POSITIVE (A) NEGATIVE   Leukocytes,Ua SMALL (A) NEGATIVE   RBC / HPF 0-5 0 - 5 RBC/hpf   WBC, UA >50 0 - 5 WBC/hpf   Bacteria, UA MANY (A) NONE SEEN   Squamous Epithelial / HPF 0-5 0 - 5 /HPF   Mucus PRESENT     Comment: Performed at [facility], [address], [place], [place] [address]  CBC     Status: Abnormal   Collection Time: 03/24/23  7:50 PM  Result Value Ref Range   WBC 15.5 (H) 4.0 - 10.5 K/uL   RBC 3.94 (L) 4.22 - 5.81 MIL/uL   Hemoglobin 12.0 (L) 13.0 - 17.0 g/dL   HCT 11.9 (L) 14.7 - 82.9 %   MCV 92.6 80.0 - 100.0 fL   MCH 30.5 26.0 - 34.0 pg   MCHC 32.9 30.0 - 36.0 g/dL   RDW 56.2 13.0 - 86.5 %   Platelets 170 150 - 400 K/uL   nRBC 0.0 0.0 - 0.2 %    Comment: Performed at [facility], [address], [place], [place] [address]  Lactic acid, plasma     Status: None   Collection Time: 03/24/23  8:30 PM  Result Value Ref Range   Lactic Acid, Venous 1.8 0.5 - 1.9 mmol/L    Comment: Performed at [facility], [address] [place], [place] [address]  Protime-INR     Status: None   Collection Time: 03/24/23  8:30 PM  Result Value Ref Range   Prothrombin Time 14.5 11.4 -  15.2 seconds   INR 1.1 0.8 - 1.2    Comment: (NOTE) INR goal varies based on device and disease states. Performed at [facility], [address], [place], [place] [address]   APTT     Status: None    Collection Time: 03/24/23  8:30 PM  Result Value Ref Range   aPTT 33 24 - 36 seconds    Comment: Performed at [facility], [address], [place], [place] [address]  Blood Culture (routine x 2)     Status: None (Preliminary result)   Collection Time: 03/24/23  8:30 PM   Specimen: BLOOD RIGHT FOREARM  Result Value Ref Range   Specimen Description      BLOOD RIGHT FOREARM BOTTLES DRAWN AEROBIC AND ANAEROBIC   Special Requests      Blood Culture results may not be optimal due to an inadequate volume of blood received in culture bottles   Culture      NO GROWTH < 12 HOURS Performed at [facility], [address], [place], [place] [address]    Report Status PENDING   Blood Culture (routine x 2)     Status: None (Preliminary result)   Collection Time: 03/24/23  8:33 PM   Specimen: BLOOD LEFT HAND  Result Value Ref Range   Specimen Description      BLOOD LEFT HAND BOTTLES DRAWN AEROBIC AND ANAEROBIC   Special Requests      Blood Culture results may not be optimal due to an inadequate volume of blood received in culture bottles   Culture      NO GROWTH < 12 HOURS Performed at [facility], [address] [place], [place] [address]    Report Status PENDING   CBC     Status: Abnormal   Collection Time: 03/24/23 11:42 PM  Result Value Ref Range   WBC 12.7 (H) 4.0 - 10.5 K/uL   RBC 3.14 (L) 4.22 - 5.81 MIL/uL   Hemoglobin 9.5 (L) 13.0 - 17.0 g/dL   HCT 29.5 (L) 62.1 - 30.8 %   MCV 103.2 (H) 80.0 - 100.0 fL   MCH 30.3 26.0 - 34.0 pg   MCHC 29.3 (L) 30.0 - 36.0 g/dL   RDW 65.7 84.6 - 96.2 %   Platelets 119 (L) 150 - 400 K/uL   nRBC 0.0 0.0 - 0.2 %    Comment: Performed at [facility], [address], [place], [place] [address]  Creatinine, serum     Status: None   Collection Time: 03/24/23 11:42 PM  Result Value Ref Range   Creatinine, Ser 1.09 0.61 - 1.24 mg/dL   GFR, Estimated >13 >24 mL/min    Comment: (NOTE) Calculated using the CKD-EPI Creatinine Equation  (2021) Performed at [facility], [address], [place], [place] [address]   CBC with Differential     Status: Abnormal   Collection Time: 03/25/23  3:34 AM  Result Value Ref Range   WBC 11.5 (H) 4.0 - 10.5 K/uL   RBC 2.95 (L) 4.22 - 5.81 MIL/uL   Hemoglobin 9.1 (L) 13.0 - 17.0 g/dL   HCT 72.5 (L) 36.6 - 44.0 %   MCV 93.6 80.0 - 100.0 fL    Comment: DELTA CHECK NOTED   MCH 30.8 26.0 - 34.0 pg   MCHC 33.0 30.0 - 36.0 g/dL   RDW 34.7 42.5 - 95.6 %   Platelets 119 (L) 150 - 400 K/uL    Comment: REPEATED TO VERIFY  nRBC 0.0 0.0 - 0.2 %   Neutrophils Relative % 87 %   Neutro Abs 10.1 (H) 1.7 - 7.7 K/uL   Lymphocytes Relative 4 %   Lymphs Abs 0.5 (L) 0.7 - 4.0 K/uL   Monocytes Relative 6 %   Monocytes Absolute 0.7 0.1 - 1.0 K/uL   Eosinophils Relative 2 %   Eosinophils Absolute 0.2 0.0 - 0.5 K/uL   Basophils Relative 0 %   Basophils Absolute 0.1 0.0 - 0.1 K/uL   Immature Granulocytes 1 %   Abs Immature Granulocytes 0.06 0.00 - 0.07 K/uL    Comment: Performed at [facility], [address], [place], [place] [address]  Basic metabolic panel     Status: Abnormal   Collection Time: 03/25/23  4:17 AM  Result Value Ref Range   Sodium 139 135 - 145 mmol/L   Potassium 4.1 3.5 - 5.1 mmol/L   Chloride 107 98 - 111 mmol/L   CO2 26 22 - 32 mmol/L   Glucose, Bld 110 (H) 70 - 99 mg/dL    Comment: Glucose reference range applies only to samples taken after fasting for at least 8 hours.   BUN 28 (H) 8 - 23 mg/dL   Creatinine, Ser 6.04 0.61 - 1.24 mg/dL   Calcium 7.9 (L) 8.9 - 10.3 mg/dL   GFR, Estimated >54 >09 mL/min    Comment: (NOTE) Calculated using the CKD-EPI Creatinine Equation (2021)    Anion gap 6 5 - 15    Comment: Performed at [facility], [address] [place], [place] [address]  Troponin I (High Sensitivity)     Status: None   Collection Time: 03/25/23  4:17 AM  Result Value Ref Range   Troponin I (High Sensitivity) 12 <18 ng/L    Comment:  (NOTE) Elevated high sensitivity troponin I (hsTnI) values and significant  changes across serial measurements may suggest ACS but many other  chronic and acute conditions are known to elevate hsTnI results.  Refer to the "Links" section for chest pain algorithms and additional  guidance. Performed at [facility], [address] [place], [place] [address]     CT CHEST ABDOMEN PELVIS W CONTRAST Result Date: 03/25/2023 CLINICAL DATA:  Trauma.  Status post fall EXAM: CT CHEST, ABDOMEN, AND PELVIS WITH CONTRAST TECHNIQUE: Multidetector CT imaging of the chest, abdomen and pelvis was performed following the standard protocol during bolus administration of intravenous contrast. RADIATION DOSE REDUCTION: This exam was performed according to the departmental dose-optimization program which includes automated exposure control, adjustment of the mA and/or kV according to patient size and/or use of iterative reconstruction technique. CONTRAST:  75mL OMNIPAQUE IOHEXOL 350 MG/ML SOLN COMPARISON:  CT chest 03/18/2023. CT chest, abdomen and pelvis 12/08/2017 FINDINGS: CT CHEST FINDINGS Cardiovascular: Heart size is normal. No pericardial effusion. The thoracic aorta appears intact. Aortic atherosclerosis and coronary artery calcifications. Mediastinum/Nodes: 2.9 cm heterogeneous nodule in left lobe of thyroid gland. This has been evaluated on previous imaging. (ref: J Am Coll Radiol. 2015 Feb;12(2): 143-50).The trachea appears patent and midline. Esophagus is unremarkable. No enlarged mediastinal or hilar lymph nodes. Lungs/Pleura: Opacification of the bronchus intermedius and right lower lobe airways identified compatible with aspiration and/or mucoid impaction. There is new airspace consolidation within the posterior right lower lobe is compatible with either aspiration and/or pneumonia. Partial atelectasis of the right lower lobe is also present. Mild subsegmental atelectasis within the posterior left lower  lobe. Bilateral apical pleuroparenchymal scarring appears similar  to previous exam. No pneumothorax. No pleural effusion. Musculoskeletal: Healed left anterior rib fractures. No acute or suspicious osseous findings. CT ABDOMEN PELVIS FINDINGS Hepatobiliary: No signs of hepatic injury or perihepatic hematoma. Sludge and multiple stones identified within the gallbladder. Gallstones measure up to 7 mm. No gallbladder wall thickening or inflammation. No bile duct dilatation. Pancreas: Unremarkable. No pancreatic ductal dilatation or surrounding inflammatory changes. Spleen: Normal in size without focal abnormality. Adrenals/Urinary Tract: Adrenal glands are unremarkable. Kidneys are normal, without renal calculi, focal lesion, or hydronephrosis. The bladder is decompressed containing 5 calcified stones which measure up to 2 cm. Stomach/Bowel: Stomach is within normal limits. Appendix appears normal. No evidence of bowel wall thickening, distention, or inflammatory changes. Vascular/Lymphatic: Aortic atherosclerosis. No aneurysm. The upper abdominal vascularity appears patent. Gastrohepatic ligament node measures 1.1 cm, image 69/3. No additional enlarged abdominopelvic lymph nodes. Reproductive: Prostate gland enlargement. Other: No free fluid or fluid collections. No signs of pneumoperitoneum. Musculoskeletal: Right hip arthroplasty. No acute or suspicious osseous findings. Lumbar degenerative disc disease. There is a nondisplaced periprosthetic fracture involving the intertrochanteric portions of the proximal right femur. No additional fractures identified. IMPRESSION: 1. Nondisplaced periprosthetic fracture involving the intertrochanteric portions of the proximal right femur. 2. Opacification of the bronchus intermedius and right lower lobe airways compatible with aspiration. 3. New airspace consolidation within the posterior right lower lobe compatible with aspiration and/or pneumonia. 4. Cholelithiasis. 5. Bladder  stones. 6. Prostate gland enlargement. 7.  Aortic Atherosclerosis (ICD10-I70.0). Electronically Signed   By: Signa Kell M.D.   On: 03/25/2023 05:38   DG Pelvis 1-2 Views Result Date: 03/24/2023 CLINICAL DATA:  Trauma, fall, right hip pain. EXAM: PELVIS - 1-2 VIEW COMPARISON:  None Available. FINDINGS: Pelvis: Right hip arthroplasty. Minimal cortical irregularity about the lateral proximal femoral cortex just below the greater trochanter may represent a nondisplaced fracture. No other pelvic fracture. Pubic rami are intact. Pubic symphysis and sacroiliac joints are congruent. Multiple lamellated calcifications in the midline may represent large bladder stones. Right femur: Cortical irregularity about the lateral femur demonstrated on the lateral view. No other fracture. Distal femur is intact. Knee alignment is maintained. No knee joint effusion. No focal soft tissue abnormalities. IMPRESSION: 1. Right hip arthroplasty. Minimal cortical irregularity about the lateral proximal femoral cortex may represent a nondisplaced fracture. 2. Remainder the femurs intact. 3. Multiple lamellated calcifications in the pelvis midline may represent large bladder stones. Electronically Signed   By: Narda Rutherford M.D.   On: 03/24/2023 23:54   CT Head Wo Contrast Addendum Date: 03/24/2023 ADDENDUM REPORT: 03/24/2023 16:18 ADDENDUM: Acute findings discussed with Dr. Estell Harpin by telephone at approximately 3:15 p.m. on 03/24/2023. Electronically Signed   By: Jackey Loge D.O.   On: 03/24/2023 16:18   Result Date: 03/24/2023 CLINICAL DATA:  Provided history: Head trauma, moderate/severe. Facial trauma, blunt. Neck trauma, intoxicated or obtunded. Additional interbody: Fall (striking face), right eye swelling, right eye laceration. EXAM: CT HEAD WITHOUT CONTRAST CT MAXILLOFACIAL WITHOUT CONTRAST CT CERVICAL SPINE WITHOUT CONTRAST TECHNIQUE: Multidetector CT imaging of the head, cervical spine, and maxillofacial structures were  performed using the standard protocol without intravenous contrast. Multiplanar CT image reconstructions of the cervical spine and maxillofacial structures were also generated. RADIATION DOSE REDUCTION: This exam was performed according to the departmental dose-optimization program which includes automated exposure control, adjustment of the mA and/or kV according to patient size and/or use of iterative reconstruction technique. COMPARISON:  Head CT 03/18/2023. Cervical spine CT 03/18/2023. Report from thyroid ultrasound 03/22/2013.  FINDINGS: CT HEAD FINDINGS Brain: Generalized cerebral atrophy. Patchy and ill-defined hypoattenuation within the cerebral white matter, nonspecific but compatible with mild-to-moderate chronic small vessel ischemic disease. There is no acute intracranial hemorrhage. No demarcated cortical infarct. No extra-axial fluid collection. No evidence of an intracranial mass. No midline shift. Vascular: No hyperdense vessel.  Atherosclerotic calcifications. Skull: Acute, comminuted and displaced fracture of the lateral wall of the right bony orbit, extending into the adjacent right sphenoid bone. Adjacent minimally displaced and nondisplaced fractures within the right orbital roof/right frontal calvarium (for instance as seen on maxillofacial CT, image 25) (maxillofacial CT series 4, images 19-21). Additional nondisplaced acute fracture of the right frontal calvarium immediately above the superior orbital rim (for instance as seen on maxillofacial CT series 5, image 11). Other: Right frontal, parietal and temporal scalp hematoma. CT MAXILLOFACIAL FINDINGS Osseous: Acute comminuted and displaced fracture of the lateral wall of the right bony orbit. A mildly displaced fracture extends from the site into the adjacent right sphenoid bone. Additionally, there are adjacent minimally displaced and nondisplaced fractures within the right orbital roof/right frontal calvarium. Acute comminuted and  displaced fracture of the right orbital floor (with depression of fracture fragments by up to 6 mm). Suspected acute, nondisplaced fracture of the right lamina papyracea (for instance as seen on series 9, image 31). Acute comminuted and displaced fractures of the anterior and posterolateral walls of the right maxillary sinus. Acute fractures also extend into the floor of the right maxillary sinus/maxillary alveolar process. No definite acute fracture of the medial wall of the right maxillary sinus is identified. Acute mildly displaced fracture of the right zygomatic arch. Orbits: Acute right orbital fractures as described above. Extensive gas within the right orbit and right periorbital region. Extraconal and intraconal/retrobulbar hemorrhage within the right orbit. Hemorrhage surrounds the right lateral and inferior rectus muscles. Right proptosis. Sinuses: Extensive hyperdense opacification of the right maxillary sinus consistent with the presence of hemorrhage. Mild nonspecific partial opacification of right ethmoid air cells. Soft tissues: Right periorbital and facial hematoma. Extensive soft tissue gas in the right periorbital region and right face. Other: Nonspecific partial opacification of the right nasal passage. CT CERVICAL SPINE FINDINGS Alignment: Dextrocurvature of the cervical spine. No significant spondylolisthesis. Skull base and vertebrae: The basion-dental and atlanto-dental intervals are maintained.No evidence of acute fracture to the cervical spine. C6-C7 vertebral body ankylosis. Soft tissues and spinal canal: No prevertebral fluid or swelling. No visible canal hematoma. Incompletely imaged left thyroid lobe nodule. Per radiology records, this nodule was previously biopsied on 03/22/2013. Correlate with the pathology report from this prior procedure. Disc levels: Cervical spondylosis with multilevel disc space narrowing, disc bulges/disc protrusions, posterior disc osteophyte complexes,  uncovertebral hypertrophy and facet arthropathy. Fusion across the C6-C7 disc space. At the non-fused levels, disc space narrowing is greatest at C5-C6 (advanced at this level). No appreciable high-grade spinal canal stenosis. Multilevel bony neural from narrowing. Degenerative changes also present at the C1-C2 articulation. Upper chest: No acute finding. Incompletely imaged, but known, left upper lobe pulmonary nodule. Attempts are being made to reach the ordering provider at this time. IMPRESSION: CT head: 1.  No evidence of an acute intracranial abnormality. 2. Acute, nondisplaced fracture of the right frontal calvarium (immediately above the right superior orbital rim). Acute orbital/facial fractures with fractures extending into the calvarium as described in the maxillofacial findings/impression section. 3. Right frontal, parietal and temporal scalp hematoma. 4. Parenchymal atrophy and chronic small vessel ischemic disease. CT maxillofacial: 1. Acute comminuted  and displaced fracture of the lateral wall of the right bony orbit. A mildly displaced fracture extends from the site into the adjacent right sphenoid bone. 2. Adjacent minimally displaced and nondisplaced fractures within the right orbital roof/right frontal calvarium. 3. Acute comminuted and displaced fracture of the right orbital floor, extending into the inferior orbital rim (with depression of fracture fragments by up to 6 mm). 4. Suspected acute, nondisplaced fracture of the right lamina papyracea , 5. Extensive acute comminuted and displaced fractures of the anterior and posterolateral walls of the right maxillary sinus. Acute fractures also extend into the floor of the right maxillary sinus/maxillary alveolar process. 6. Acute mildly displaced fracture of the right zygomatic arch. 7. Extraconal and intraconal/retrobulbar hemorrhage within the right orbit. Hemorrhage surrounds the right lateral and inferior rectus muscles. Correlate with physical  exam findings to exclude extraocular muscle entrapment. 8. Extensive gas within the right orbit. 9. Right proptosis. 10. Right periorbital/facial hematoma and subcutaneous gas. CT cervical spine: 1. No evidence of acute cervical spine fracture. 2. Dextrocurvature of the cervical spine. 3. C6-C7 vertebral ankylosis. 4. Cervical spondylosis as described. Electronically Signed: By: Jackey Loge D.O. On: 03/24/2023 15:13   CT Cervical Spine Wo Contrast Addendum Date: 03/24/2023 ADDENDUM REPORT: 03/24/2023 16:18 ADDENDUM: Acute findings discussed with Dr. Estell Harpin by telephone at approximately 3:15 p.m. on 03/24/2023. Electronically Signed   By: Jackey Loge D.O.   On: 03/24/2023 16:18   Result Date: 03/24/2023 CLINICAL DATA:  Provided history: Head trauma, moderate/severe. Facial trauma, blunt. Neck trauma, intoxicated or obtunded. Additional interbody: Fall (striking face), right eye swelling, right eye laceration. EXAM: CT HEAD WITHOUT CONTRAST CT MAXILLOFACIAL WITHOUT CONTRAST CT CERVICAL SPINE WITHOUT CONTRAST TECHNIQUE: Multidetector CT imaging of the head, cervical spine, and maxillofacial structures were performed using the standard protocol without intravenous contrast. Multiplanar CT image reconstructions of the cervical spine and maxillofacial structures were also generated. RADIATION DOSE REDUCTION: This exam was performed according to the departmental dose-optimization program which includes automated exposure control, adjustment of the mA and/or kV according to patient size and/or use of iterative reconstruction technique. COMPARISON:  Head CT 03/18/2023. Cervical spine CT 03/18/2023. Report from thyroid ultrasound 03/22/2013. FINDINGS: CT HEAD FINDINGS Brain: Generalized cerebral atrophy. Patchy and ill-defined hypoattenuation within the cerebral white matter, nonspecific but compatible with mild-to-moderate chronic small vessel ischemic disease. There is no acute intracranial hemorrhage. No demarcated  cortical infarct. No extra-axial fluid collection. No evidence of an intracranial mass. No midline shift. Vascular: No hyperdense vessel.  Atherosclerotic calcifications. Skull: Acute, comminuted and displaced fracture of the lateral wall of the right bony orbit, extending into the adjacent right sphenoid bone. Adjacent minimally displaced and nondisplaced fractures within the right orbital roof/right frontal calvarium (for instance as seen on maxillofacial CT, image 25) (maxillofacial CT series 4, images 19-21). Additional nondisplaced acute fracture of the right frontal calvarium immediately above the superior orbital rim (for instance as seen on maxillofacial CT series 5, image 11). Other: Right frontal, parietal and temporal scalp hematoma. CT MAXILLOFACIAL FINDINGS Osseous: Acute comminuted and displaced fracture of the lateral wall of the right bony orbit. A mildly displaced fracture extends from the site into the adjacent right sphenoid bone. Additionally, there are adjacent minimally displaced and nondisplaced fractures within the right orbital roof/right frontal calvarium. Acute comminuted and displaced fracture of the right orbital floor (with depression of fracture fragments by up to 6 mm). Suspected acute, nondisplaced fracture of the right lamina papyracea (for instance as seen on series 9,  image 31). Acute comminuted and displaced fractures of the anterior and posterolateral walls of the right maxillary sinus. Acute fractures also extend into the floor of the right maxillary sinus/maxillary alveolar process. No definite acute fracture of the medial wall of the right maxillary sinus is identified. Acute mildly displaced fracture of the right zygomatic arch. Orbits: Acute right orbital fractures as described above. Extensive gas within the right orbit and right periorbital region. Extraconal and intraconal/retrobulbar hemorrhage within the right orbit. Hemorrhage surrounds the right lateral and inferior  rectus muscles. Right proptosis. Sinuses: Extensive hyperdense opacification of the right maxillary sinus consistent with the presence of hemorrhage. Mild nonspecific partial opacification of right ethmoid air cells. Soft tissues: Right periorbital and facial hematoma. Extensive soft tissue gas in the right periorbital region and right face. Other: Nonspecific partial opacification of the right nasal passage. CT CERVICAL SPINE FINDINGS Alignment: Dextrocurvature of the cervical spine. No significant spondylolisthesis. Skull base and vertebrae: The basion-dental and atlanto-dental intervals are maintained.No evidence of acute fracture to the cervical spine. C6-C7 vertebral body ankylosis. Soft tissues and spinal canal: No prevertebral fluid or swelling. No visible canal hematoma. Incompletely imaged left thyroid lobe nodule. Per radiology records, this nodule was previously biopsied on 03/22/2013. Correlate with the pathology report from this prior procedure. Disc levels: Cervical spondylosis with multilevel disc space narrowing, disc bulges/disc protrusions, posterior disc osteophyte complexes, uncovertebral hypertrophy and facet arthropathy. Fusion across the C6-C7 disc space. At the non-fused levels, disc space narrowing is greatest at C5-C6 (advanced at this level). No appreciable high-grade spinal canal stenosis. Multilevel bony neural from narrowing. Degenerative changes also present at the C1-C2 articulation. Upper chest: No acute finding. Incompletely imaged, but known, left upper lobe pulmonary nodule. Attempts are being made to reach the ordering provider at this time. IMPRESSION: CT head: 1.  No evidence of an acute intracranial abnormality. 2. Acute, nondisplaced fracture of the right frontal calvarium (immediately above the right superior orbital rim). Acute orbital/facial fractures with fractures extending into the calvarium as described in the maxillofacial findings/impression section. 3. Right  frontal, parietal and temporal scalp hematoma. 4. Parenchymal atrophy and chronic small vessel ischemic disease. CT maxillofacial: 1. Acute comminuted and displaced fracture of the lateral wall of the right bony orbit. A mildly displaced fracture extends from the site into the adjacent right sphenoid bone. 2. Adjacent minimally displaced and nondisplaced fractures within the right orbital roof/right frontal calvarium. 3. Acute comminuted and displaced fracture of the right orbital floor, extending into the inferior orbital rim (with depression of fracture fragments by up to 6 mm). 4. Suspected acute, nondisplaced fracture of the right lamina papyracea , 5. Extensive acute comminuted and displaced fractures of the anterior and posterolateral walls of the right maxillary sinus. Acute fractures also extend into the floor of the right maxillary sinus/maxillary alveolar process. 6. Acute mildly displaced fracture of the right zygomatic arch. 7. Extraconal and intraconal/retrobulbar hemorrhage within the right orbit. Hemorrhage surrounds the right lateral and inferior rectus muscles. Correlate with physical exam findings to exclude extraocular muscle entrapment. 8. Extensive gas within the right orbit. 9. Right proptosis. 10. Right periorbital/facial hematoma and subcutaneous gas. CT cervical spine: 1. No evidence of acute cervical spine fracture. 2. Dextrocurvature of the cervical spine. 3. C6-C7 vertebral ankylosis. 4. Cervical spondylosis as described. Electronically Signed: By: Jackey Loge D.O. On: 03/24/2023 15:13   CT Maxillofacial Wo Contrast Addendum Date: 03/24/2023 ADDENDUM REPORT: 03/24/2023 16:18 ADDENDUM: Acute findings discussed with Dr. Estell Harpin by telephone at approximately  3:15 p.m. on 03/24/2023. Electronically Signed   By: Jackey Loge D.O.   On: 03/24/2023 16:18   Result Date: 03/24/2023 CLINICAL DATA:  Provided history: Head trauma, moderate/severe. Facial trauma, blunt. Neck trauma, intoxicated  or obtunded. Additional interbody: Fall (striking face), right eye swelling, right eye laceration. EXAM: CT HEAD WITHOUT CONTRAST CT MAXILLOFACIAL WITHOUT CONTRAST CT CERVICAL SPINE WITHOUT CONTRAST TECHNIQUE: Multidetector CT imaging of the head, cervical spine, and maxillofacial structures were performed using the standard protocol without intravenous contrast. Multiplanar CT image reconstructions of the cervical spine and maxillofacial structures were also generated. RADIATION DOSE REDUCTION: This exam was performed according to the departmental dose-optimization program which includes automated exposure control, adjustment of the mA and/or kV according to patient size and/or use of iterative reconstruction technique. COMPARISON:  Head CT 03/18/2023. Cervical spine CT 03/18/2023. Report from thyroid ultrasound 03/22/2013. FINDINGS: CT HEAD FINDINGS Brain: Generalized cerebral atrophy. Patchy and ill-defined hypoattenuation within the cerebral white matter, nonspecific but compatible with mild-to-moderate chronic small vessel ischemic disease. There is no acute intracranial hemorrhage. No demarcated cortical infarct. No extra-axial fluid collection. No evidence of an intracranial mass. No midline shift. Vascular: No hyperdense vessel.  Atherosclerotic calcifications. Skull: Acute, comminuted and displaced fracture of the lateral wall of the right bony orbit, extending into the adjacent right sphenoid bone. Adjacent minimally displaced and nondisplaced fractures within the right orbital roof/right frontal calvarium (for instance as seen on maxillofacial CT, image 25) (maxillofacial CT series 4, images 19-21). Additional nondisplaced acute fracture of the right frontal calvarium immediately above the superior orbital rim (for instance as seen on maxillofacial CT series 5, image 11). Other: Right frontal, parietal and temporal scalp hematoma. CT MAXILLOFACIAL FINDINGS Osseous: Acute comminuted and displaced fracture  of the lateral wall of the right bony orbit. A mildly displaced fracture extends from the site into the adjacent right sphenoid bone. Additionally, there are adjacent minimally displaced and nondisplaced fractures within the right orbital roof/right frontal calvarium. Acute comminuted and displaced fracture of the right orbital floor (with depression of fracture fragments by up to 6 mm). Suspected acute, nondisplaced fracture of the right lamina papyracea (for instance as seen on series 9, image 31). Acute comminuted and displaced fractures of the anterior and posterolateral walls of the right maxillary sinus. Acute fractures also extend into the floor of the right maxillary sinus/maxillary alveolar process. No definite acute fracture of the medial wall of the right maxillary sinus is identified. Acute mildly displaced fracture of the right zygomatic arch. Orbits: Acute right orbital fractures as described above. Extensive gas within the right orbit and right periorbital region. Extraconal and intraconal/retrobulbar hemorrhage within the right orbit. Hemorrhage surrounds the right lateral and inferior rectus muscles. Right proptosis. Sinuses: Extensive hyperdense opacification of the right maxillary sinus consistent with the presence of hemorrhage. Mild nonspecific partial opacification of right ethmoid air cells. Soft tissues: Right periorbital and facial hematoma. Extensive soft tissue gas in the right periorbital region and right face. Other: Nonspecific partial opacification of the right nasal passage. CT CERVICAL SPINE FINDINGS Alignment: Dextrocurvature of the cervical spine. No significant spondylolisthesis. Skull base and vertebrae: The basion-dental and atlanto-dental intervals are maintained.No evidence of acute fracture to the cervical spine. C6-C7 vertebral body ankylosis. Soft tissues and spinal canal: No prevertebral fluid or swelling. No visible canal hematoma. Incompletely imaged left thyroid lobe  nodule. Per radiology records, this nodule was previously biopsied on 03/22/2013. Correlate with the pathology report from this prior procedure. Disc levels: Cervical spondylosis with  multilevel disc space narrowing, disc bulges/disc protrusions, posterior disc osteophyte complexes, uncovertebral hypertrophy and facet arthropathy. Fusion across the C6-C7 disc space. At the non-fused levels, disc space narrowing is greatest at C5-C6 (advanced at this level). No appreciable high-grade spinal canal stenosis. Multilevel bony neural from narrowing. Degenerative changes also present at the C1-C2 articulation. Upper chest: No acute finding. Incompletely imaged, but known, left upper lobe pulmonary nodule. Attempts are being made to reach the ordering provider at this time. IMPRESSION: CT head: 1.  No evidence of an acute intracranial abnormality. 2. Acute, nondisplaced fracture of the right frontal calvarium (immediately above the right superior orbital rim). Acute orbital/facial fractures with fractures extending into the calvarium as described in the maxillofacial findings/impression section. 3. Right frontal, parietal and temporal scalp hematoma. 4. Parenchymal atrophy and chronic small vessel ischemic disease. CT maxillofacial: 1. Acute comminuted and displaced fracture of the lateral wall of the right bony orbit. A mildly displaced fracture extends from the site into the adjacent right sphenoid bone. 2. Adjacent minimally displaced and nondisplaced fractures within the right orbital roof/right frontal calvarium. 3. Acute comminuted and displaced fracture of the right orbital floor, extending into the inferior orbital rim (with depression of fracture fragments by up to 6 mm). 4. Suspected acute, nondisplaced fracture of the right lamina papyracea , 5. Extensive acute comminuted and displaced fractures of the anterior and posterolateral walls of the right maxillary sinus. Acute fractures also extend into the floor of  the right maxillary sinus/maxillary alveolar process. 6. Acute mildly displaced fracture of the right zygomatic arch. 7. Extraconal and intraconal/retrobulbar hemorrhage within the right orbit. Hemorrhage surrounds the right lateral and inferior rectus muscles. Correlate with physical exam findings to exclude extraocular muscle entrapment. 8. Extensive gas within the right orbit. 9. Right proptosis. 10. Right periorbital/facial hematoma and subcutaneous gas. CT cervical spine: 1. No evidence of acute cervical spine fracture. 2. Dextrocurvature of the cervical spine. 3. C6-C7 vertebral ankylosis. 4. Cervical spondylosis as described. Electronically Signed: By: Jackey Loge D.O. On: 03/24/2023 15:13   DG Chest Port 1 View Result Date: 03/24/2023 CLINICAL DATA:  Shortness of breath EXAM: PORTABLE CHEST 1 VIEW COMPARISON:  07/13/2021 FINDINGS: Right-sided central venous port tip over the SVC. Hyperinflation. No acute airspace disease, pleural effusion or pneumothorax. Normal cardiomediastinal contour. IMPRESSION: No active disease. Hyperinflation. Electronically Signed   By: Jasmine Pang M.D.   On: 03/24/2023 15:51    ROS Blood pressure (!) 80/49, pulse 72, temperature 98 F (36.7 C), temperature source Oral, resp. rate 15, height 6\' 3"  (1.905 m), weight 80.7 kg, SpO2 98%. Physical Exam HENT:     Right Ear: External ear normal.     Left Ear: External ear normal.     Nose: Nose normal.     Mouth/Throat:     Mouth: Mucous membranes are moist.  Eyes:     Comments: Right ecchymosis. The eye has some chemosis. Joseph [name] has no diplopia. Vision per opthal.   Neurological:     Mental Status: Joseph [name] is alert.       Assessment/Plan: Right orbital and [facility] fx- Joseph [name] has had opthal consult with no intervention needed now. Joseph [name] will possibly need repair of the fractures but will let him settle for about week then discuss it.   Suzanna Obey 03/25/2023, 10:34 AM

## 2023-03-25 NOTE — ED Notes (Signed)
 Pts BP dropped to 60s systolic. Trauma RN Alycia Rossetti made aware. Orders in place

## 2023-03-25 NOTE — ED Notes (Signed)
 Trauma MD made aware of pts BP

## 2023-03-25 NOTE — ED Notes (Signed)
 Pt notified this RN that he self caths at home. Self cath kit provided to the pt.

## 2023-03-25 NOTE — Progress Notes (Signed)
 Pharmacy Antibiotic Note  [name] is a 73 y.o. male admitted on 03/24/2023 with sepsis.  Pharmacy has been consulted for zosyn and vancomycin dosing.  Plan: Zosyn 3.375g q8h  Vancomycin 1750mg  x 1 load > 1000mg  q12h (eAUC 491, Scr 1.01) F/u renal function,infectious work up and length of therapy Vancomycin levels as needed   Height: 6\' 3"  (190.5 cm) Weight: 80.7 kg (177 lb 14.6 oz) IBW/kg (Calculated) : 84.5  Temp (24hrs), Avg:98.1 F (36.7 C), Min:97.5 F (36.4 C), Max:98.6 F (37 C)  Recent Labs  Lab 03/18/23 1723 03/24/23 1339 03/24/23 1950 03/24/23 2030 03/24/23 2342 03/25/23 0334 03/25/23 0417  WBC 6.0 9.0 15.5*  --  12.7* 11.5*  --   CREATININE 1.11 1.22  --   --  1.09  --  1.01  LATICACIDVEN  --   --   --  1.8  --   --   --     Estimated Creatinine Clearance: 75.5 mL/min (by C-G formula based on SCr of 1.01 mg/dL).    Allergies  Allergen Reactions   Neurontin [Gabapentin] Other (See Comments)    Causes seizures   Buspirone     Lips swelling    Dilaudid [Hydromorphone Hcl] Other (See Comments)    Oxygen saturation drops    Antimicrobials this admission: Ceftriaxone 3/11 Doxycycline 3/12 Vancomycin 3/12 > zosyn 3/12 >  Microbiology results: 3/11 resp panel: neg 3/11 Ucx: 3/11 Bcx: NGTD  3/11 resp cx:    Thank you for allowing pharmacy to be a part of this patient's care.  Marja Kays 03/25/2023 7:01 AM

## 2023-03-25 NOTE — Progress Notes (Signed)
 On first exam, extraocular movement appeared intact and patient with no complaints of double vision.  As the night progressed, and he is sitting in different positions, he now complains of some double vision.  Orbit does not appear to be protruding.  Extraocular movement may be slightly limited laterally and inferiorly on re-assessment.  Contacted Dr. Jearld Fenton to update at 1226 AM, who recommended contacting ophthalmology.  Called Dr. Dione Booze at 1230. He feels lateral canthotomy is not necessary unless vision out of the eye is compromised.  He will evaluate the patient first thing in the morning.  Quentin Ore, MD General, Bariatric and Minimally Invasive Surgery [facility] Surgery - [facility]

## 2023-03-25 NOTE — Progress Notes (Signed)
 Subjective: Patient states he feels better today.  His diplopia is better, but he c/o "haziness" in the right eye.  Denies SOB.  Said about 8-10 yrs ago he pulled a foley out and messed up the urinary system and so now he I/Os TID at home.  Denies frequent UTIs.  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  Has pain in his right hip.  Lives by himself.  ROS: See above, otherwise other systems negative  Objective: Vital signs in last 24 hours: Temp:  [97.5 F (36.4 C)-98.6 F (37 C)] 97.5 F (36.4 C) (03/12 0516) Pulse Rate:  [56-112] 73 (03/12 0745) Resp:  [1-28] 11 (03/12 0745) BP: (58-197)/(44-122) 107/72 (03/12 0745) SpO2:  [86 %-100 %] 99 % (03/12 0745) Weight:  [80.7 kg] 80.7 kg (03/11 1246)    Intake/Output from previous day: 03/11 0701 - 03/12 0700 In: 2114.2 [I.V.:1114.2; IV Piggyback:1000] Out: 400 [Urine:400] Intake/Output this shift: No intake/output data recorded.  PE: Gen: NAD, laying in bed. HEENT: significant right-sided facial edema, periorbital ecchymosis and subconjunctival hemorrhage.  EOMI bilaterally.  No nystagmus in right eye.   Neck: trachea midline Heart: regular Lungs: CTAB, O2 weaned down from 5L.  Does eventually desat into 80s on RA after a while so replaced. Abd: soft, NT, ND Ext: appropriately moves all 4 extremities except his RLE which he is unable to move well secondary to pain in his hip/thigh.  +2 radial/pedal pulses bilaterally.  No edema Neuro: grossly intact Psych: A&Ox3  Lab Results:  Recent Labs    03/24/23 2342 03/25/23 0334  WBC 12.7* 11.5*  HGB 9.5* 9.1*  HCT 32.4* 27.6*  PLT 119* 119*   BMET Recent Labs    03/24/23 1339 03/24/23 2342 03/25/23 0417  NA 139  --  139  K 4.0  --  4.1  CL 97*  --  107  CO2 30  --  26  GLUCOSE 127*  --  110*  BUN 29*  --  28*  CREATININE 1.22 1.09 1.01  CALCIUM 9.6  --  7.9*   PT/INR Recent Labs    03/24/23 2030  LABPROT 14.5  INR 1.1   CMP      Component Value Date/Time   NA 139 03/25/2023 0417   NA 142 12/16/2013 1305   K 4.1 03/25/2023 0417   K 4.7 12/16/2013 1305   CL 107 03/25/2023 0417   CO2 26 03/25/2023 0417   CO2 32 (H) 12/16/2013 1305   GLUCOSE 110 (H) 03/25/2023 0417   GLUCOSE 86 12/16/2013 1305   BUN 28 (H) 03/25/2023 0417   BUN 16.6 12/16/2013 1305   CREATININE 1.01 03/25/2023 0417   CREATININE 0.8 12/16/2013 1305   CALCIUM 7.9 (L) 03/25/2023 0417   CALCIUM 9.9 12/16/2013 1305   PROT 8.0 03/24/2023 1339   PROT 8.0 12/16/2013 1305   ALBUMIN 4.3 03/24/2023 1339   ALBUMIN 4.0 12/16/2013 1305   AST 21 03/24/2023 1339   AST 20 12/16/2013 1305   ALT 15 03/24/2023 1339   ALT 13 12/16/2013 1305   ALKPHOS 89 03/24/2023 1339   ALKPHOS 84 12/16/2013 1305   BILITOT 0.9 03/24/2023 1339   BILITOT 0.45 12/16/2013 1305   GFRNONAA >60 03/25/2023 0417   GFRAA >60 01/19/2018 1010   Lipase  No results found for: "LIPASE"     Studies/Results: CT CHEST ABDOMEN PELVIS W CONTRAST Result Date: 03/25/2023 CLINICAL DATA:  Trauma.  Status post fall EXAM: CT CHEST, ABDOMEN,  AND PELVIS WITH CONTRAST TECHNIQUE: Multidetector CT imaging of the chest, abdomen and pelvis was performed following the standard protocol during bolus administration of intravenous contrast. RADIATION DOSE REDUCTION: This exam was performed according to the departmental dose-optimization program which includes automated exposure control, adjustment of the mA and/or kV according to patient size and/or use of iterative reconstruction technique. CONTRAST:  75mL OMNIPAQUE IOHEXOL 350 MG/ML SOLN COMPARISON:  CT chest 03/18/2023. CT chest, abdomen and pelvis 12/08/2017 FINDINGS: CT CHEST FINDINGS Cardiovascular: Heart size is normal. No pericardial effusion. The thoracic aorta appears intact. Aortic atherosclerosis and coronary artery calcifications. Mediastinum/Nodes: 2.9 cm heterogeneous nodule in left lobe of thyroid gland. This has been evaluated on previous  imaging. (ref: J Am Coll Radiol. 2015 Feb;12(2): 143-50).The trachea appears patent and midline. Esophagus is unremarkable. No enlarged mediastinal or hilar lymph nodes. Lungs/Pleura: Opacification of the bronchus intermedius and right lower lobe airways identified compatible with aspiration and/or mucoid impaction. There is new airspace consolidation within the posterior right lower lobe is compatible with either aspiration and/or pneumonia. Partial atelectasis of the right lower lobe is also present. Mild subsegmental atelectasis within the posterior left lower lobe. Bilateral apical pleuroparenchymal scarring appears similar to previous exam. No pneumothorax. No pleural effusion. Musculoskeletal: Healed left anterior rib fractures. No acute or suspicious osseous findings. CT ABDOMEN PELVIS FINDINGS Hepatobiliary: No signs of hepatic injury or perihepatic hematoma. Sludge and multiple stones identified within the gallbladder. Gallstones measure up to 7 mm. No gallbladder wall thickening or inflammation. No bile duct dilatation. Pancreas: Unremarkable. No pancreatic ductal dilatation or surrounding inflammatory changes. Spleen: Normal in size without focal abnormality. Adrenals/Urinary Tract: Adrenal glands are unremarkable. Kidneys are normal, without renal calculi, focal lesion, or hydronephrosis. The bladder is decompressed containing 5 calcified stones which measure up to 2 cm. Stomach/Bowel: Stomach is within normal limits. Appendix appears normal. No evidence of bowel wall thickening, distention, or inflammatory changes. Vascular/Lymphatic: Aortic atherosclerosis. No aneurysm. The upper abdominal vascularity appears patent. Gastrohepatic ligament node measures 1.1 cm, image 69/3. No additional enlarged abdominopelvic lymph nodes. Reproductive: Prostate gland enlargement. Other: No free fluid or fluid collections. No signs of pneumoperitoneum. Musculoskeletal: Right hip arthroplasty. No acute or suspicious  osseous findings. Lumbar degenerative disc disease. There is a nondisplaced periprosthetic fracture involving the intertrochanteric portions of the proximal right femur. No additional fractures identified. IMPRESSION: 1. Nondisplaced periprosthetic fracture involving the intertrochanteric portions of the proximal right femur. 2. Opacification of the bronchus intermedius and right lower lobe airways compatible with aspiration. 3. New airspace consolidation within the posterior right lower lobe compatible with aspiration and/or pneumonia. 4. Cholelithiasis. 5. Bladder stones. 6. Prostate gland enlargement. 7.  Aortic Atherosclerosis (ICD10-I70.0). Electronically Signed   By: Signa Kell M.D.   On: 03/25/2023 05:38   DG Pelvis 1-2 Views Result Date: 03/24/2023 CLINICAL DATA:  Trauma, fall, right hip pain. EXAM: PELVIS - 1-2 VIEW COMPARISON:  None Available. FINDINGS: Pelvis: Right hip arthroplasty. Minimal cortical irregularity about the lateral proximal femoral cortex just below the greater trochanter may represent a nondisplaced fracture. No other pelvic fracture. Pubic rami are intact. Pubic symphysis and sacroiliac joints are congruent. Multiple lamellated calcifications in the midline may represent large bladder stones. Right femur: Cortical irregularity about the lateral femur demonstrated on the lateral view. No other fracture. Distal femur is intact. Knee alignment is maintained. No knee joint effusion. No focal soft tissue abnormalities. IMPRESSION: 1. Right hip arthroplasty. Minimal cortical irregularity about the lateral proximal femoral cortex may represent a nondisplaced  fracture. 2. Remainder the femurs intact. 3. Multiple lamellated calcifications in the pelvis midline may represent large bladder stones. Electronically Signed   By: Narda Rutherford M.D.   On: 03/24/2023 23:54   CT Head Wo Contrast Addendum Date: 03/24/2023 ADDENDUM REPORT: 03/24/2023 16:18 ADDENDUM: Acute findings discussed with  Dr. Estell Harpin by telephone at approximately 3:15 p.m. on 03/24/2023. Electronically Signed   By: Jackey Loge D.O.   On: 03/24/2023 16:18   Result Date: 03/24/2023 CLINICAL DATA:  Provided history: Head trauma, moderate/severe. Facial trauma, blunt. Neck trauma, intoxicated or obtunded. Additional interbody: Fall (striking face), right eye swelling, right eye laceration. EXAM: CT HEAD WITHOUT CONTRAST CT MAXILLOFACIAL WITHOUT CONTRAST CT CERVICAL SPINE WITHOUT CONTRAST TECHNIQUE: Multidetector CT imaging of the head, cervical spine, and maxillofacial structures were performed using the standard protocol without intravenous contrast. Multiplanar CT image reconstructions of the cervical spine and maxillofacial structures were also generated. RADIATION DOSE REDUCTION: This exam was performed according to the departmental dose-optimization program which includes automated exposure control, adjustment of the mA and/or kV according to patient size and/or use of iterative reconstruction technique. COMPARISON:  Head CT 03/18/2023. Cervical spine CT 03/18/2023. Report from thyroid ultrasound 03/22/2013. FINDINGS: CT HEAD FINDINGS Brain: Generalized cerebral atrophy. Patchy and ill-defined hypoattenuation within the cerebral white matter, nonspecific but compatible with mild-to-moderate chronic small vessel ischemic disease. There is no acute intracranial hemorrhage. No demarcated cortical infarct. No extra-axial fluid collection. No evidence of an intracranial mass. No midline shift. Vascular: No hyperdense vessel.  Atherosclerotic calcifications. Skull: Acute, comminuted and displaced fracture of the lateral wall of the right bony orbit, extending into the adjacent right sphenoid bone. Adjacent minimally displaced and nondisplaced fractures within the right orbital roof/right frontal calvarium (for instance as seen on maxillofacial CT, image 25) (maxillofacial CT series 4, images 19-21). Additional nondisplaced acute  fracture of the right frontal calvarium immediately above the superior orbital rim (for instance as seen on maxillofacial CT series 5, image 11). Other: Right frontal, parietal and temporal scalp hematoma. CT MAXILLOFACIAL FINDINGS Osseous: Acute comminuted and displaced fracture of the lateral wall of the right bony orbit. A mildly displaced fracture extends from the site into the adjacent right sphenoid bone. Additionally, there are adjacent minimally displaced and nondisplaced fractures within the right orbital roof/right frontal calvarium. Acute comminuted and displaced fracture of the right orbital floor (with depression of fracture fragments by up to 6 mm). Suspected acute, nondisplaced fracture of the right lamina papyracea (for instance as seen on series 9, image 31). Acute comminuted and displaced fractures of the anterior and posterolateral walls of the right maxillary sinus. Acute fractures also extend into the floor of the right maxillary sinus/maxillary alveolar process. No definite acute fracture of the medial wall of the right maxillary sinus is identified. Acute mildly displaced fracture of the right zygomatic arch. Orbits: Acute right orbital fractures as described above. Extensive gas within the right orbit and right periorbital region. Extraconal and intraconal/retrobulbar hemorrhage within the right orbit. Hemorrhage surrounds the right lateral and inferior rectus muscles. Right proptosis. Sinuses: Extensive hyperdense opacification of the right maxillary sinus consistent with the presence of hemorrhage. Mild nonspecific partial opacification of right ethmoid air cells. Soft tissues: Right periorbital and facial hematoma. Extensive soft tissue gas in the right periorbital region and right face. Other: Nonspecific partial opacification of the right nasal passage. CT CERVICAL SPINE FINDINGS Alignment: Dextrocurvature of the cervical spine. No significant spondylolisthesis. Skull base and vertebrae:  The basion-dental and atlanto-dental intervals are  maintained.No evidence of acute fracture to the cervical spine. C6-C7 vertebral body ankylosis. Soft tissues and spinal canal: No prevertebral fluid or swelling. No visible canal hematoma. Incompletely imaged left thyroid lobe nodule. Per radiology records, this nodule was previously biopsied on 03/22/2013. Correlate with the pathology report from this prior procedure. Disc levels: Cervical spondylosis with multilevel disc space narrowing, disc bulges/disc protrusions, posterior disc osteophyte complexes, uncovertebral hypertrophy and facet arthropathy. Fusion across the C6-C7 disc space. At the non-fused levels, disc space narrowing is greatest at C5-C6 (advanced at this level). No appreciable high-grade spinal canal stenosis. Multilevel bony neural from narrowing. Degenerative changes also present at the C1-C2 articulation. Upper chest: No acute finding. Incompletely imaged, but known, left upper lobe pulmonary nodule. Attempts are being made to reach the ordering provider at this time. IMPRESSION: CT head: 1.  No evidence of an acute intracranial abnormality. 2. Acute, nondisplaced fracture of the right frontal calvarium (immediately above the right superior orbital rim). Acute orbital/facial fractures with fractures extending into the calvarium as described in the maxillofacial findings/impression section. 3. Right frontal, parietal and temporal scalp hematoma. 4. Parenchymal atrophy and chronic small vessel ischemic disease. CT maxillofacial: 1. Acute comminuted and displaced fracture of the lateral wall of the right bony orbit. A mildly displaced fracture extends from the site into the adjacent right sphenoid bone. 2. Adjacent minimally displaced and nondisplaced fractures within the right orbital roof/right frontal calvarium. 3. Acute comminuted and displaced fracture of the right orbital floor, extending into the inferior orbital rim (with depression of  fracture fragments by up to 6 mm). 4. Suspected acute, nondisplaced fracture of the right lamina papyracea , 5. Extensive acute comminuted and displaced fractures of the anterior and posterolateral walls of the right maxillary sinus. Acute fractures also extend into the floor of the right maxillary sinus/maxillary alveolar process. 6. Acute mildly displaced fracture of the right zygomatic arch. 7. Extraconal and intraconal/retrobulbar hemorrhage within the right orbit. Hemorrhage surrounds the right lateral and inferior rectus muscles. Correlate with physical exam findings to exclude extraocular muscle entrapment. 8. Extensive gas within the right orbit. 9. Right proptosis. 10. Right periorbital/facial hematoma and subcutaneous gas. CT cervical spine: 1. No evidence of acute cervical spine fracture. 2. Dextrocurvature of the cervical spine. 3. C6-C7 vertebral ankylosis. 4. Cervical spondylosis as described. Electronically Signed: By: Jackey Loge D.O. On: 03/24/2023 15:13   CT Cervical Spine Wo Contrast Addendum Date: 03/24/2023 ADDENDUM REPORT: 03/24/2023 16:18 ADDENDUM: Acute findings discussed with Dr. Estell Harpin by telephone at approximately 3:15 p.m. on 03/24/2023. Electronically Signed   By: Jackey Loge D.O.   On: 03/24/2023 16:18   Result Date: 03/24/2023 CLINICAL DATA:  Provided history: Head trauma, moderate/severe. Facial trauma, blunt. Neck trauma, intoxicated or obtunded. Additional interbody: Fall (striking face), right eye swelling, right eye laceration. EXAM: CT HEAD WITHOUT CONTRAST CT MAXILLOFACIAL WITHOUT CONTRAST CT CERVICAL SPINE WITHOUT CONTRAST TECHNIQUE: Multidetector CT imaging of the head, cervical spine, and maxillofacial structures were performed using the standard protocol without intravenous contrast. Multiplanar CT image reconstructions of the cervical spine and maxillofacial structures were also generated. RADIATION DOSE REDUCTION: This exam was performed according to the  departmental dose-optimization program which includes automated exposure control, adjustment of the mA and/or kV according to patient size and/or use of iterative reconstruction technique. COMPARISON:  Head CT 03/18/2023. Cervical spine CT 03/18/2023. Report from thyroid ultrasound 03/22/2013. FINDINGS: CT HEAD FINDINGS Brain: Generalized cerebral atrophy. Patchy and ill-defined hypoattenuation within the cerebral white matter, nonspecific but compatible  with mild-to-moderate chronic small vessel ischemic disease. There is no acute intracranial hemorrhage. No demarcated cortical infarct. No extra-axial fluid collection. No evidence of an intracranial mass. No midline shift. Vascular: No hyperdense vessel.  Atherosclerotic calcifications. Skull: Acute, comminuted and displaced fracture of the lateral wall of the right bony orbit, extending into the adjacent right sphenoid bone. Adjacent minimally displaced and nondisplaced fractures within the right orbital roof/right frontal calvarium (for instance as seen on maxillofacial CT, image 25) (maxillofacial CT series 4, images 19-21). Additional nondisplaced acute fracture of the right frontal calvarium immediately above the superior orbital rim (for instance as seen on maxillofacial CT series 5, image 11). Other: Right frontal, parietal and temporal scalp hematoma. CT MAXILLOFACIAL FINDINGS Osseous: Acute comminuted and displaced fracture of the lateral wall of the right bony orbit. A mildly displaced fracture extends from the site into the adjacent right sphenoid bone. Additionally, there are adjacent minimally displaced and nondisplaced fractures within the right orbital roof/right frontal calvarium. Acute comminuted and displaced fracture of the right orbital floor (with depression of fracture fragments by up to 6 mm). Suspected acute, nondisplaced fracture of the right lamina papyracea (for instance as seen on series 9, image 31). Acute comminuted and displaced  fractures of the anterior and posterolateral walls of the right maxillary sinus. Acute fractures also extend into the floor of the right maxillary sinus/maxillary alveolar process. No definite acute fracture of the medial wall of the right maxillary sinus is identified. Acute mildly displaced fracture of the right zygomatic arch. Orbits: Acute right orbital fractures as described above. Extensive gas within the right orbit and right periorbital region. Extraconal and intraconal/retrobulbar hemorrhage within the right orbit. Hemorrhage surrounds the right lateral and inferior rectus muscles. Right proptosis. Sinuses: Extensive hyperdense opacification of the right maxillary sinus consistent with the presence of hemorrhage. Mild nonspecific partial opacification of right ethmoid air cells. Soft tissues: Right periorbital and facial hematoma. Extensive soft tissue gas in the right periorbital region and right face. Other: Nonspecific partial opacification of the right nasal passage. CT CERVICAL SPINE FINDINGS Alignment: Dextrocurvature of the cervical spine. No significant spondylolisthesis. Skull base and vertebrae: The basion-dental and atlanto-dental intervals are maintained.No evidence of acute fracture to the cervical spine. C6-C7 vertebral body ankylosis. Soft tissues and spinal canal: No prevertebral fluid or swelling. No visible canal hematoma. Incompletely imaged left thyroid lobe nodule. Per radiology records, this nodule was previously biopsied on 03/22/2013. Correlate with the pathology report from this prior procedure. Disc levels: Cervical spondylosis with multilevel disc space narrowing, disc bulges/disc protrusions, posterior disc osteophyte complexes, uncovertebral hypertrophy and facet arthropathy. Fusion across the C6-C7 disc space. At the non-fused levels, disc space narrowing is greatest at C5-C6 (advanced at this level). No appreciable high-grade spinal canal stenosis. Multilevel bony neural from  narrowing. Degenerative changes also present at the C1-C2 articulation. Upper chest: No acute finding. Incompletely imaged, but known, left upper lobe pulmonary nodule. Attempts are being made to reach the ordering provider at this time. IMPRESSION: CT head: 1.  No evidence of an acute intracranial abnormality. 2. Acute, nondisplaced fracture of the right frontal calvarium (immediately above the right superior orbital rim). Acute orbital/facial fractures with fractures extending into the calvarium as described in the maxillofacial findings/impression section. 3. Right frontal, parietal and temporal scalp hematoma. 4. Parenchymal atrophy and chronic small vessel ischemic disease. CT maxillofacial: 1. Acute comminuted and displaced fracture of the lateral wall of the right bony orbit. A mildly displaced fracture extends from the site  into the adjacent right sphenoid bone. 2. Adjacent minimally displaced and nondisplaced fractures within the right orbital roof/right frontal calvarium. 3. Acute comminuted and displaced fracture of the right orbital floor, extending into the inferior orbital rim (with depression of fracture fragments by up to 6 mm). 4. Suspected acute, nondisplaced fracture of the right lamina papyracea , 5. Extensive acute comminuted and displaced fractures of the anterior and posterolateral walls of the right maxillary sinus. Acute fractures also extend into the floor of the right maxillary sinus/maxillary alveolar process. 6. Acute mildly displaced fracture of the right zygomatic arch. 7. Extraconal and intraconal/retrobulbar hemorrhage within the right orbit. Hemorrhage surrounds the right lateral and inferior rectus muscles. Correlate with physical exam findings to exclude extraocular muscle entrapment. 8. Extensive gas within the right orbit. 9. Right proptosis. 10. Right periorbital/facial hematoma and subcutaneous gas. CT cervical spine: 1. No evidence of acute cervical spine fracture. 2.  Dextrocurvature of the cervical spine. 3. C6-C7 vertebral ankylosis. 4. Cervical spondylosis as described. Electronically Signed: By: Jackey Loge D.O. On: 03/24/2023 15:13   CT Maxillofacial Wo Contrast Addendum Date: 03/24/2023 ADDENDUM REPORT: 03/24/2023 16:18 ADDENDUM: Acute findings discussed with Dr. Estell Harpin by telephone at approximately 3:15 p.m. on 03/24/2023. Electronically Signed   By: Jackey Loge D.O.   On: 03/24/2023 16:18   Result Date: 03/24/2023 CLINICAL DATA:  Provided history: Head trauma, moderate/severe. Facial trauma, blunt. Neck trauma, intoxicated or obtunded. Additional interbody: Fall (striking face), right eye swelling, right eye laceration. EXAM: CT HEAD WITHOUT CONTRAST CT MAXILLOFACIAL WITHOUT CONTRAST CT CERVICAL SPINE WITHOUT CONTRAST TECHNIQUE: Multidetector CT imaging of the head, cervical spine, and maxillofacial structures were performed using the standard protocol without intravenous contrast. Multiplanar CT image reconstructions of the cervical spine and maxillofacial structures were also generated. RADIATION DOSE REDUCTION: This exam was performed according to the departmental dose-optimization program which includes automated exposure control, adjustment of the mA and/or kV according to patient size and/or use of iterative reconstruction technique. COMPARISON:  Head CT 03/18/2023. Cervical spine CT 03/18/2023. Report from thyroid ultrasound 03/22/2013. FINDINGS: CT HEAD FINDINGS Brain: Generalized cerebral atrophy. Patchy and ill-defined hypoattenuation within the cerebral white matter, nonspecific but compatible with mild-to-moderate chronic small vessel ischemic disease. There is no acute intracranial hemorrhage. No demarcated cortical infarct. No extra-axial fluid collection. No evidence of an intracranial mass. No midline shift. Vascular: No hyperdense vessel.  Atherosclerotic calcifications. Skull: Acute, comminuted and displaced fracture of the lateral wall of the  right bony orbit, extending into the adjacent right sphenoid bone. Adjacent minimally displaced and nondisplaced fractures within the right orbital roof/right frontal calvarium (for instance as seen on maxillofacial CT, image 25) (maxillofacial CT series 4, images 19-21). Additional nondisplaced acute fracture of the right frontal calvarium immediately above the superior orbital rim (for instance as seen on maxillofacial CT series 5, image 11). Other: Right frontal, parietal and temporal scalp hematoma. CT MAXILLOFACIAL FINDINGS Osseous: Acute comminuted and displaced fracture of the lateral wall of the right bony orbit. A mildly displaced fracture extends from the site into the adjacent right sphenoid bone. Additionally, there are adjacent minimally displaced and nondisplaced fractures within the right orbital roof/right frontal calvarium. Acute comminuted and displaced fracture of the right orbital floor (with depression of fracture fragments by up to 6 mm). Suspected acute, nondisplaced fracture of the right lamina papyracea (for instance as seen on series 9, image 31). Acute comminuted and displaced fractures of the anterior and posterolateral walls of the right maxillary sinus. Acute fractures also  extend into the floor of the right maxillary sinus/maxillary alveolar process. No definite acute fracture of the medial wall of the right maxillary sinus is identified. Acute mildly displaced fracture of the right zygomatic arch. Orbits: Acute right orbital fractures as described above. Extensive gas within the right orbit and right periorbital region. Extraconal and intraconal/retrobulbar hemorrhage within the right orbit. Hemorrhage surrounds the right lateral and inferior rectus muscles. Right proptosis. Sinuses: Extensive hyperdense opacification of the right maxillary sinus consistent with the presence of hemorrhage. Mild nonspecific partial opacification of right ethmoid air cells. Soft tissues: Right  periorbital and facial hematoma. Extensive soft tissue gas in the right periorbital region and right face. Other: Nonspecific partial opacification of the right nasal passage. CT CERVICAL SPINE FINDINGS Alignment: Dextrocurvature of the cervical spine. No significant spondylolisthesis. Skull base and vertebrae: The basion-dental and atlanto-dental intervals are maintained.No evidence of acute fracture to the cervical spine. C6-C7 vertebral body ankylosis. Soft tissues and spinal canal: No prevertebral fluid or swelling. No visible canal hematoma. Incompletely imaged left thyroid lobe nodule. Per radiology records, this nodule was previously biopsied on 03/22/2013. Correlate with the pathology report from this prior procedure. Disc levels: Cervical spondylosis with multilevel disc space narrowing, disc bulges/disc protrusions, posterior disc osteophyte complexes, uncovertebral hypertrophy and facet arthropathy. Fusion across the C6-C7 disc space. At the non-fused levels, disc space narrowing is greatest at C5-C6 (advanced at this level). No appreciable high-grade spinal canal stenosis. Multilevel bony neural from narrowing. Degenerative changes also present at the C1-C2 articulation. Upper chest: No acute finding. Incompletely imaged, but known, left upper lobe pulmonary nodule. Attempts are being made to reach the ordering provider at this time. IMPRESSION: CT head: 1.  No evidence of an acute intracranial abnormality. 2. Acute, nondisplaced fracture of the right frontal calvarium (immediately above the right superior orbital rim). Acute orbital/facial fractures with fractures extending into the calvarium as described in the maxillofacial findings/impression section. 3. Right frontal, parietal and temporal scalp hematoma. 4. Parenchymal atrophy and chronic small vessel ischemic disease. CT maxillofacial: 1. Acute comminuted and displaced fracture of the lateral wall of the right bony orbit. A mildly displaced  fracture extends from the site into the adjacent right sphenoid bone. 2. Adjacent minimally displaced and nondisplaced fractures within the right orbital roof/right frontal calvarium. 3. Acute comminuted and displaced fracture of the right orbital floor, extending into the inferior orbital rim (with depression of fracture fragments by up to 6 mm). 4. Suspected acute, nondisplaced fracture of the right lamina papyracea , 5. Extensive acute comminuted and displaced fractures of the anterior and posterolateral walls of the right maxillary sinus. Acute fractures also extend into the floor of the right maxillary sinus/maxillary alveolar process. 6. Acute mildly displaced fracture of the right zygomatic arch. 7. Extraconal and intraconal/retrobulbar hemorrhage within the right orbit. Hemorrhage surrounds the right lateral and inferior rectus muscles. Correlate with physical exam findings to exclude extraocular muscle entrapment. 8. Extensive gas within the right orbit. 9. Right proptosis. 10. Right periorbital/facial hematoma and subcutaneous gas. CT cervical spine: 1. No evidence of acute cervical spine fracture. 2. Dextrocurvature of the cervical spine. 3. C6-C7 vertebral ankylosis. 4. Cervical spondylosis as described. Electronically Signed: By: Jackey Loge D.O. On: 03/24/2023 15:13   DG Chest Port 1 View Result Date: 03/24/2023 CLINICAL DATA:  Shortness of breath EXAM: PORTABLE CHEST 1 VIEW COMPARISON:  07/13/2021 FINDINGS: Right-sided central venous port tip over the SVC. Hyperinflation. No acute airspace disease, pleural effusion or pneumothorax. Normal cardiomediastinal contour.  IMPRESSION: No active disease. Hyperinflation. Electronically Signed   By: Jasmine Pang M.D.   On: 03/24/2023 15:51    Anti-infectives: Anti-infectives (From admission, onward)    Start     Dose/Rate Route Frequency Ordered Stop   03/25/23 2200  cefTRIAXone (ROCEPHIN) 2 g in sodium chloride 0.9 % 100 mL IVPB  Status:   Discontinued        2 g 200 mL/hr over 30 Minutes Intravenous Every 24 hours 03/25/23 0513 03/25/23 0650   03/25/23 2000  vancomycin (VANCOCIN) IVPB 1000 mg/200 mL premix        1,000 mg 200 mL/hr over 60 Minutes Intravenous Every 12 hours 03/25/23 0726     03/25/23 0730  piperacillin-tazobactam (ZOSYN) IVPB 3.375 g        3.375 g 12.5 mL/hr over 240 Minutes Intravenous Every 8 hours 03/25/23 0718     03/25/23 0730  vancomycin (VANCOREADY) IVPB 1750 mg/350 mL        1,750 mg 175 mL/hr over 120 Minutes Intravenous  Once 03/25/23 0719     03/25/23 0715  vancomycin (VANCOCIN) IVPB 1000 mg/200 mL premix  Status:  Discontinued        1,000 mg 200 mL/hr over 60 Minutes Intravenous Every 12 hours 03/25/23 0712 03/25/23 0717   03/25/23 0715  piperacillin-tazobactam (ZOSYN) IVPB 3.375 g  Status:  Discontinued        3.375 g 100 mL/hr over 30 Minutes Intravenous Every 8 hours 03/25/23 0712 03/25/23 0718   03/25/23 0000  doxycycline (VIBRA-TABS) tablet 100 mg        100 mg Oral 2 times daily 03/24/23 2346     03/24/23 2030  cefTRIAXone (ROCEPHIN) 2 g in sodium chloride 0.9 % 100 mL IVPB        2 g 200 mL/hr over 30 Minutes Intravenous Once 03/24/23 2016 03/24/23 2131        Assessment/Plan GLF R frontal calvarium fx - pain control. No underlying ICH Facial fxs - Dr. Jearld Fenton to see.  Dr. Dione Booze with ophthalmology has seen the patient.  Will order erythromycin ointment for eye and small lac TID per his recs.  Right now does not need lateral canthotomy.  Will need close monitoring and follow up in 1-2 weeks outpatient. R hip periprosthetic fx - spoke to ortho who will see patient this morning.  Bedrest for now, NWB to RLE Hypotension - unclear etiology.  Weaned off of peripheral very low dose levo.  ? Related to UTI vs RLL consolidation or something else.  Medicine to see and assist.  Appreciate their assistance. Empiric vanc/zosyn given unknown etiology at this time UTI - UA appears dirty, urine cx  pending RLL consolidation/? Aspiration vs PNA - medicine to see, overall CTA.  Sats are low though at times.  Continue O2.  Abx therapy.  respiratory culture pending. ?DM - no home meds listed.  Check hgba1c.  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 He has not had tx in 10 years and doing well. Unsteady gait - frequent falls at home, uses a cane.  Lives by himself Unclear medication usage - doxy in med rec as well as prednisone 40mg  daily.  Patient is unclear why he has these listed.  Unsure if he takes them.  Will have pharmacy verify his home meds. Urethral stricture- I/O cath TID at home due to injury many years ago. Anemia-  likely of chronic disease. monitor FEN - NPO  x meds until all consultants have seen/IVFs/off levo VTE - lovenox ID - vanc/zosyn Dispo - ICU pending need for levo again.  I reviewed ED provider notes, last 24 h vitals and pain scores, last 48 h intake and output, last 24 h labs and trends, last 24 h imaging results, and reviewed chart history of previous admissions, hospitalist notes, as well as oncology  notes .   LOS: 1 day    Letha Cape , [facility] Surgery 03/25/2023, 8:33 AM Please see Amion for pager number during day hours 7:00am-4:30pm or 7:00am -11:30am on weekends

## 2023-03-25 NOTE — Consult Note (Signed)
 Ophthalmology Initial Consult Note  [name], 73 y.o. male Date of Service:  03/25/2023      Requesting physician: Md, Trauma, MD  Information Obtained from: Patient Chief Complaint:  Orbital fractures  HPI/Discussion:  [name] is a 73 y.o. male who fell on his face and has orbital fractures. The patient denies diplopia. He says his vision is normal. No flashes or floaters. Has a little pain with eye movement.  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 Vernie Ammons   GASTROSTOMY TUBE PLACEMENT  04/04/2013   LAPAROSCOPIC GASTROSTOMY N/A 04/04/2013   Procedure: LAPAROSCOPIC GASTROSTOMY TUBE PLACEMENT ;  Surgeon: Axel Filler, MD;  Location: [facility] OR;  Service: General;  Laterality: N/A;   MULTIPLE EXTRACTIONS WITH ALVEOLOPLASTY N/A 04/14/2013   Procedure: Extraction of tooth #'s 1,2,3,4,5,6,7,8,9,10,11,12,13,14,15,17,18,19,20,21,22,23,24,25,26,27,28,29, 30, 31, and 32 with alveoloplasty and bilateral mandibular tori reductions.;  Surgeon: Charlynne Pander, DDS;  Location: [facility] OR;  Service: Oral Surgery;  Laterality: N/A;   NASAL HEMORRHAGE CONTROL N/A 04/04/2013   Procedure: Control of oropharyngeal hemorrhage;  Surgeon: Darletta Moll, MD;  Location[facility] OR;  Service: ENT;  Laterality: N/A;   PORTACATH PLACEMENT Right 04/04/2013   PORTACATH PLACEMENT N/A 04/04/2013   Procedure: INSERTION PORT-A-CATH;  Surgeon: Axel Filler, MD;  Location: [facility] OR;  Service: General;  Laterality: N/A;   TOTAL HIP ARTHROPLASTY Right 2000   Dr. Eulah Pont    Prior to Admission Meds: (Not in a hospital admission)   Inpatient Meds: @IPMEDS @  Allergies  Allergen Reactions   Neurontin [Gabapentin] Other (See Comments)    Causes seizures   Buspirone     Lips swelling    Dilaudid [Hydromorphone Hcl] Other (See Comments)    Oxygen saturation drops   Social History   Tobacco Use   Smoking status: Never   Smokeless tobacco: Never  Substance Use Topics   Alcohol use: No    Comment: Occasional beer   Family History  Problem Relation Age of Onset   Cancer Cousin        living brain cancer, male   Cancer Mother        Deceased with leukemia, had uterine ca   Hyperlipidemia Brother     ROS: Other than ROS in the HPI, all other systems were negative.  Exam: Temp: (!) 97.5 F (36.4 C) Pulse Rate: 73 BP: 107/72 Resp: 11 SpO2: 99 %  Visual Acuity:    cc  ph  near   OD  +     20/70   OS  +  20/70     OD OS  Confr Vis Fields WNL WNL  EOM (Primary) Minimal limitation in all directions likely due to edema and pneumo-orbit WNL  Lids/Lashes WNL WNL  Conjunctiva - Bulbar Diffuse SCH WNL  Conjunctiva - Palpebral               WNL WNL  Adnexa  3cm laceration to lateral brow WNL  Pupils  WNL WNL  Reaction, Direct WNL WNL                 Consensual WNL WNL                 RAPD No No  Cornea  WNL WNL  Anterior Chamber WNL WNL  Lens:  NSC NSC  IOP 21 15  Fundus - Dilated? Yes   Optic Disc - C:D Ratio 0.4 0.4                     Appearance  WNL WNL                     NF Layer WNL WNL  Post Seg:  Retina                    Vessels WNL WNL                  Vitreous  WNL WNL                  Macula WNL WNL                   Periphery WNL WNL       Neuro:  Oriented to person, place, and time:  Yes Psychiatric:  Mood and Affect Appropriate:  Yes  Labs/imaging: Extensive right orbital fractures involving the medial, lateral, and inferior walls; There is intraconal and extraconal hemorrhage as well as pneumo-orbit with mild proptosis; The globe/nerve is not on stretch  A/P:  73 y.o. male with extensive orbital fractures on the right - Despite presence of pneumo-orbit and retrobulbar hemorrhage, canthotomy is not needed - Fractures are extensive - May need repair per ENT, but at present he appears ortho; May end up developing enophthalmos and diplopia without fracture repair - Will monitor - The eyes themselves are normal; Retina attached - Recommend erythromycin ophthalmic ointment TID to lacerations around eye and in eye if he is unable to close completely - No nose blowing - Consider ABx sinus prophylaxis per ENT - Will plan to see him as an outpatient - Arrange 1-2 wk f/u upon discharge  [name], MD 03/25/2023, 8:08 AM

## 2023-03-25 NOTE — ED Notes (Signed)
Trauma provider at bedside

## 2023-03-25 NOTE — Progress Notes (Signed)
 Trauma Event Note    Rounding on pt with Barnetta Chapel, [facility]- pt is a/o x 4, denies any blurry or double vision at this time. Levo turned off for BPs in the 130s- O2 off for 100% sats-- may turn both back on as needed per T PA.   Dr. Dione Booze also at bedside --   Last imported Vital Signs BP 119/75   Pulse 92   Temp (!) 97.5 F (36.4 C) (Tympanic)   Resp 14   Ht 6\' 3"  (1.905 [name])   Wt 177 lb 14.6 oz (80.7 kg)   SpO2 90%   BMI 22.24 kg/[name]   Trending CBC Recent Labs    03/24/23 1950 03/24/23 2342 03/25/23 0334  WBC 15.5* 12.7* 11.5*  HGB 12.0* 9.5* 9.1*  HCT 36.5* 32.4* 27.6*  PLT 170 119* 119*    Trending Coag's Recent Labs    03/24/23 2030  APTT 33  INR 1.1    Trending BMET Recent Labs    03/24/23 1339 03/24/23 2342 03/25/23 0417  NA 139  --  139  K 4.0  --  4.1  CL 97*  --  107  CO2 30  --  26  BUN 29*  --  28*  CREATININE 1.22 1.09 1.01  GLUCOSE 127*  --  110*      [name] [name]  Trauma Response RN  Please call TRN at [phone] for further assistance.

## 2023-03-25 NOTE — Consult Note (Signed)
 CONSULT NOTE  [name]  DOB: 12-Sep-1950  PCP: Assunta Found, MD MVH:[id number]  DOA: 03/24/2023  LOS: 1 day  Hospital Day: 2  Brief narrative: [name] is a 73 y.o. male with PMH significant for DM2, tonsillar cancer, lung cancer s/p chemo/radiation/PEG tube 10 years ago, chronic urinary retention, does self cath 3 times daily, fibromyalgia, anxiety/depression. 3/11, patient was brought to the ED at [facility] by EMS after a fall.  He was at a local bank where he tripped and fell, landed on his face, sustained significant swelling, laceration of right eye without vision impairment or loss of consciousness.  In the ED, patient was afebrile, heart rate in 80s, blood pressure initially was elevated to 190s. Initial labs with CBC unremarkable, BMP with BUN/creatinine 29/1.22 Respiratory virus panel unremarkable Urinalysis showed hazy yellow urine with small leukocytes, positive nitrite, many bacteria Labs repeated later showed WBC count up to 15.5, lactic acid level normal Blood culture and urine culture sent Chest x-ray without acute findings.  CT head, CT maxillofacial and CT cervical spine were obtained with extensive findings as below -No acute intracranial abnormality, parenchymal atrophy and chronic small vessel ischemic disease -No acute cervical spine fracture -Right frontal, parietal and temporal scalp hematoma. -Acute, nondisplaced fracture of the right frontal calvarium (immediately above the right superior orbital rim).  -Acute comminuted and displaced fracture of the lateral wall of the right bony orbit. A mildly displaced fracture extends from the site into the adjacent right sphenoid bone. -Adjacent minimally displaced and nondisplaced fractures within the right orbital roof/right frontal calvarium. -Acute comminuted and displaced fracture of the right orbital floor, extending into the inferior orbital rim (with depression of fracture fragments by up to 6  mm). -Suspected acute, nondisplaced fracture of the right lamina papyracea , -Extensive acute comminuted and displaced fractures of the anterior and posterolateral walls of the right maxillary sinus. -Acute fractures also extend into the floor of the right maxillary sinus/maxillary alveolar process. -Acute mildly displaced fracture of the right zygomatic arch. -Extraconal and intraconal/retrobulbar hemorrhage within the right orbit. Hemorrhage surrounds the right lateral and inferior rectus muscles. Correlate with physical exam findings to exclude extraocular muscle entrapment. -Extensive gas within the right orbit. -Right proptosis. -Right periorbital/facial hematoma and subcutaneous gas.  CT chest abdomen pelvis showed -nondisplaced periprosthetic fracture of proximal right femur -Right lower lobe infiltrates  -Bladder stones   Patient was transferred ER to ER to [facility] Admitted by trauma surgery Orthopedic surgery was consulted for femur fracture.  Per trauma surgeon note, on initial exam, extraocular movement was intact and patient did not have double vision.  However as the night progressed, patient started complaining of double vision and extraocular movement seem to be limited.[name] ENT Dr. Jearld Fenton and ophthalmology Dr. Dione Booze were contacted. No urgent recommendation.  Plan was made to see in the morning  Overnight, blood pressure dropped down to as low as 60s and he was briefly started on pressors which was stopped this morning.  Blood pressure in normal range this morning  Trauma surgery team called hospitalist service for consulted this morning for comanagement of medical issues.  Subjective: Patient was seen and examined this morning.  Pleasant elderly Caucasian male. Lying in bed.  Right eye and right face swelling noted.   Speech unclear at baseline since tonsillar cancer but able to give history. Denies any recent history of pneumonia. Family not at bedside RN at bedside  trying to titrate Levophed which he seems to be  very sensitive to.  Assessment and plan: Right lower lobe infiltrate Possible aspiration in the setting of prior tonsillar cancer and now trauma WBC count elevated, lactic acid were normal It seems primary team has started the patient on IV Zosyn and IV vancomycin.  Will continue the same for now Recent Labs  Lab 03/18/23 1723 03/24/23 1339 03/24/23 1950 03/24/23 2030 03/24/23 2342 03/25/23 0334  WBC 6.0 9.0 15.5*  --  12.7* 11.5*  LATICACIDVEN  --   --   --  1.8  --   --    Acute respiratory failure with hypoxia Not on supplemental oxygen at home.  On 2 L oxygen nasal cannula now in the setting of pneumonia,  Hypotension Blood pressure was down in 60s last night briefly required pressors.  Trauma team tried to turn off pressors this morning but blood pressure dipped down again.  Currently on very low-dose of Levophed.  I spoke with critical care team. Continue IV hydration.  Currently on LR at 125 mL/h  UTI Chronic urinary retention Urinalysis positive for infection in the setting of chronic urinary retention with BPH, urethral stricture does self-catheterization at home 3 times daily. Imaging with multiple bladder stones Currently on antibiotics as above  urine culture and blood culture sent  Type 2 diabetes mellitus A1c 6.6 PTA meds-none Start SSI/Accu-Cheks No results for input(s): "GLUCAP" in the last 168 hours.  H/o metastatic tonsillar cancer H/o lung cancer Reportedly treated with radiation, chemotherapy.  Had PEG tube changes ago.  Acute anemia Noted hemoglobin dropped from 14.6 yesterday to 9.1 today. Trauma surgery following Recent Labs    03/18/23 1723 03/24/23 1339 03/24/23 1950 03/24/23 2342 03/25/23 0334  HGB 14.4 14.6 12.0* 9.5* 9.1*  MCV 93.9 93.5 92.6 103.2* 93.6   fibromyalgia,  anxiety/depression PTA meds Celexa 40 mg daily Xanax 2 mg 3 times daily as needed, Continue both  Extensive facial  fractures Secondary to a facedown fall  CT maxillofacial findings as above Admitted by trauma surgery.  To be seen by ENT and ophthalmology  Right frontal calvarium fracture No underlying ICH.  Pain controlled per trauma surgery  Right hip prosthetic fracture Seen by orthopedics.  Recommended nonweightbearing to RLE, no surgical intervention  Generalized weakness Frequent falls Lives at home alone.  Uses a cane.    Goals of care   Code Status: Full Code    DVT prophylaxis:  enoxaparin (LOVENOX) injection 30 mg Start: 03/25/23 1000 SCDs Start: 03/24/23 2342   Antimicrobials: IV vancomycin, IV Zosyn Fluid: LR at 125 mL/h Family Communication: None at bedside  Status: Inpatient Level of care:  ICU   Patient is from: Home Needs to continue in-hospital care: Trauma Anticipated d/c to: Pending clinical course   Diet:  Diet Order             Diet NPO time specified Except for: Sips with Meds  Diet effective now                   Scheduled Meds:  acetaminophen  1,000 mg Oral Q6H   citalopram  40 mg Oral Daily   docusate sodium  100 mg Oral BID   enoxaparin (LOVENOX) injection  30 mg Subcutaneous Q12H   erythromycin   Right Eye Q8H   insulin aspart  0-9 Units Subcutaneous TID WC   methocarbamol  500 mg Oral Q8H   Or   methocarbamol (ROBAXIN) injection  500 mg Intravenous Q8H   OLANZapine  5 mg Oral QHS  rosuvastatin  10 mg Oral QHS   tamsulosin  0.4 mg Oral Daily    PRN meds: albuterol, alprazolam, diphenhydrAMINE, hydrALAZINE, morphine injection, ondansetron **OR** ondansetron (ZOFRAN) IV, polyethylene glycol, traMADol   Infusions:   lactated ringers 125 mL/hr at 03/25/23 0916   norepinephrine (LEVOPHED) Adult infusion 1 mcg/min (03/25/23 1035)   piperacillin-tazobactam (ZOSYN)  IV Stopped (03/25/23 1051)   vancomycin     vancomycin 1,750 mg (03/25/23 1016)    Antimicrobials: Anti-infectives (From admission, onward)    Start     Dose/Rate Route  Frequency Ordered Stop   03/25/23 2200  cefTRIAXone (ROCEPHIN) 2 g in sodium chloride 0.9 % 100 mL IVPB  Status:  Discontinued        2 g 200 mL/hr over 30 Minutes Intravenous Every 24 hours 03/25/23 0513 03/25/23 0650   03/25/23 2000  vancomycin (VANCOCIN) IVPB 1000 mg/200 mL premix        1,000 mg 200 mL/hr over 60 Minutes Intravenous Every 12 hours 03/25/23 0726     03/25/23 0730  piperacillin-tazobactam (ZOSYN) IVPB 3.375 g        3.375 g 12.5 mL/hr over 240 Minutes Intravenous Every 8 hours 03/25/23 0718     03/25/23 0730  vancomycin (VANCOREADY) IVPB 1750 mg/350 mL        1,750 mg 175 mL/hr over 120 Minutes Intravenous  Once 03/25/23 0719     03/25/23 0715  vancomycin (VANCOCIN) IVPB 1000 mg/200 mL premix  Status:  Discontinued        1,000 mg 200 mL/hr over 60 Minutes Intravenous Every 12 hours 03/25/23 0712 03/25/23 0717   03/25/23 0715  piperacillin-tazobactam (ZOSYN) IVPB 3.375 g  Status:  Discontinued        3.375 g 100 mL/hr over 30 Minutes Intravenous Every 8 hours 03/25/23 0712 03/25/23 0718   03/25/23 0000  doxycycline (VIBRA-TABS) tablet 100 mg  Status:  Discontinued        100 mg Oral 2 times daily 03/24/23 2346 03/25/23 0835   03/24/23 2030  cefTRIAXone (ROCEPHIN) 2 g in sodium chloride 0.9 % 100 mL IVPB        2 g 200 mL/hr over 30 Minutes Intravenous Once 03/24/23 2016 03/24/23 2131       Objective: Vitals:   03/25/23 1040 03/25/23 1050  BP: (!) 109/94 105/63  Pulse: 84 82  Resp: 13 15  Temp:    SpO2: 98% 98%    Intake/Output Summary (Last 24 hours) at 03/25/2023 1055 Last data filed at 03/25/2023 0604 Gross per 24 hour  Intake 2114.17 ml  Output 400 ml  Net 1714.17 ml   Filed Weights   03/24/23 1246  Weight: 80.7 kg   Weight change:  Body mass index is 22.24 kg/m.   Physical Exam: General exam: Pleasant, elderly Caucasian male Skin: No rashes, lesions or ulcers. HEENT: Extensive rolling of eyes of the face.  Ecchymosis around right  eye Lungs: Clear to auscultation bilaterally,  CVS: S1, S2, no murmur,   GI/Abd: Soft, nontender, nondistended, bowel sound present,   CNS: Alert, awake, oriented x 3.  Garbled speech at baseline due to history of tonsillar cancer Psychiatry: Mood appropriate,  Extremities: No pedal edema, no calf tenderness,   Data Review: I have personally reviewed the laboratory data and studies available.  F/u labs ordered Unresulted Labs (From admission, onward)     Start     Ordered   03/31/23 0500  Creatinine, serum  (enoxaparin (LOVENOX)    CrCl >/=  30 with major trauma, spinal cord injury, or selected orthopedic surgery)  Weekly,   R     Comments: while on enoxaparin therapy.    03/24/23 2345   03/25/23 1052  Lactic acid, plasma  (Lactic Acid)  ONCE - STAT,   STAT        03/25/23 1051   03/25/23 0837  Hemoglobin A1c  Once,   R       Comments: To assess prior glycemic control    03/25/23 0836   03/25/23 0650  Culture, Respiratory w Gram Stain  Once,   R        03/25/23 0649   03/25/23 0500  CBC  Tomorrow morning,   R        03/24/23 2345   03/24/23 2016  Urine Culture  Once,   R       Question:  Indication  Answer:  Dysuria   03/24/23 2016            Total time spent in review of labs and imaging, patient evaluation, formulation of plan, documentation and communication with family: 85 minutes  Signed, Lorin Glass, MD [facility] 03/25/2023

## 2023-03-25 NOTE — Consult Note (Signed)
 NAME:  [name], MRN:  [id number], DOB:  12-14-1950, LOS: 1 ADMISSION DATE:  03/24/2023, CONSULTATION DATE: 03/25/2023 REFERRING MD:  Barnetta Chapel, PA , CHIEF COMPLAINT: Post fall and now hypotensive  History of Present Illness:  73 year old male with diabetes type 2, tonsillar and lung cancer status post chemoradiation, chronic urinary retention, does self-catheterization 3 times a day who presented after a fall.  As per patient he was at a local bank where he tripped and fell landing on his face sustaining significant swelling and laceration of his right eye, without losing consciousness or vision On workup patient was noted to have multiple facial fractures, right frontal, parietal and temporal scalp hematoma and multiple other fractures.  Patient was admitted by trauma surgery, PCCM was consulted for help evaluation medical management  Patient was noted to be hypotensive, he was started on vasopressor support.  On exam he looks like severely dry with dry mucous membranes and he is dysarthric because of dry mouth.  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 rate 12, height 6\' 3"  (1.905 m), weight 80.7 kg, SpO2 96%.        Intake/Output Summary (Last 24 hours) at 03/25/2023 1131 Last data filed at 03/25/2023 0604 Gross per 24 hour  Intake 2114.17 ml  Output 400 ml  Net 1714.17 ml   Filed Weights   03/24/23 1246  Weight: 80.7 kg    Examination: General: Acute on chronically ill-appearing elderly male, lying on the bed HEENT: Large bruise mark noted on the right side of the forehead with swelling around right eye and some conjunctival hemorrhage.  Dry mucous membranes Neuro: Alert, awake following commands, severely dysarthric Chest: Faint rales heard at right lower lung field, no wheezes or rhonchi Heart: Regular rate and rhythm, no murmurs or gallops Abdomen: Soft, nontender, nondistended, bowel sounds present Skin: No rash  Labs and images reviewed Resolved Hospital Problem list     Assessment & Plan:  Acute respiratory failure with hypoxia Right lower lobe pneumonia, likely aspiration Dehydration Acute urinary tract infection Chronic urinary retention, does self-catheterization  Diabetes type 2 Multiple facial fractures Right frontal calvarium fracture Right hip prosthetic fracture Anemia and thrombocytopenia critical illness  Currently patient is on 2 L nasal cannula oxygen, titrate with O2 sat goal 92% Currently on broad-spectrum antibiotics with vancomycin and Zosyn Blood cultures are negative Follow-up respiratory and urine culture  UA is suggestive of acute UTI CT chest suggestive of right lower lobe infiltrates Will place Foley catheter Monitor fingerstick goal 140-180 Monitor H&H and platelet count Rest of the management per trauma team   Best Practice (right click and "Reselect all SmartList Selections" daily)   Diet/type: dysphagia diet (see orders) DVT prophylaxis  LMWH Pressure ulcer(s): N/A GI prophylaxis: N/A Lines: N/A Foley:  Yes, and it is still needed Code Status:  full code Last date of multidisciplinary goals of care discussion [Per primary team]  Labs   CBC: Recent Labs  Lab 03/18/23 1723 03/24/23 1339 03/24/23 1950 03/24/23 2342 03/25/23 0334  WBC 6.0 9.0 15.5* 12.7* 11.5*  NEUTROABS 4.6 8.1*  --   --  10.1*  HGB 14.4 14.6 12.0* 9.5* 9.1*  HCT 44.6 44.9 36.5* 32.4* 27.6*  MCV 93.9 93.5 92.6 103.2* 93.6  PLT 183 180 170 119* 119*    Basic Metabolic Panel: Recent Labs  Lab 03/18/23 1723 03/24/23 1339 03/24/23 2342 03/25/23 0417  NA 143 139  --  139  K 3.7 4.0  --  4.1  CL 104 97*  --  107  CO2 31 30  --  26  GLUCOSE 122* 127*  --  110*  BUN 28* 29*  --  28*  CREATININE 1.11 1.22 1.09 1.01  CALCIUM 9.9 9.6  --  7.9*   GFR: Estimated Creatinine Clearance: 75.5 mL/min (by C-G formula based on SCr of 1.01 mg/dL). Recent Labs  Lab 03/24/23 1339 03/24/23 1950 03/24/23 2030 03/24/23 2342 03/25/23 0334  WBC 9.0 15.5*  --  12.7* 11.5*  LATICACIDVEN  --   --  1.8  --   --     Liver Function Tests: Recent Labs  Lab 03/18/23 1723 03/24/23 1339  AST 20 21  ALT 14 15  ALKPHOS 74 89  BILITOT 0.7 0.9  PROT 7.7 8.0  ALBUMIN 4.2 4.3   No results for input(s): "LIPASE", "AMYLASE" in the last 168 hours. No results for input(s): "AMMONIA" in the last 168 hours.  ABG    Component Value Date/Time   PHART 7.436 05/01/2010 0520   PCO2ART 39.3 05/01/2010 0520   PO2ART 65.5 (L) 05/01/2010 0520   HCO3 26.0 (H) 05/01/2010 0520   TCO2 23.0 05/01/2010 0520   O2SAT 94.2 05/01/2010 0520     Coagulation Profile: Recent Labs  Lab 03/24/23 2030  INR 1.1    Cardiac Enzymes: No results for input(s): "CKTOTAL", "CKMB", "CKMBINDEX", "TROPONINI" in the last 168 hours.  HbA1C: Hgb A1c MFr Bld  Date/Time Value Ref Range Status  03/25/2023 08:37 AM 5.3 4.8 - 5.6 % Final    Comment:    (NOTE) Pre diabetes:           5.7%-6.4%  Diabetes:              >6.4%  Glycemic control for   <7.0% adults with diabetes   10/18/2012 02:39 AM 6.6 (H) <5.7 % Final    Comment:    (NOTE)                                                                       According to the ADA Clinical Practice Recommendations for 2011, when HbA1c is used as  a screening test:  >=6.5%   Diagnostic of Diabetes Mellitus           (if abnormal result is confirmed) 5.7-6.4%   Increased risk of developing Diabetes Mellitus References:Diagnosis and Classification of Diabetes Mellitus,Diabetes Care,2011,34(Suppl 1):S62-S69 and Standards of Medical Care in         Diabetes - 2011,Diabetes Care,2011,34 (Suppl 1):S11-S61.    CBG: No results for input(s): "GLUCAP" in the last 168 hours.  Review of Systems:   12 point review of system is significant for complaint mentioned in HPI, rest negative  Past Medical History:  He,  has a past medical history of Allergy, Anxiety, Arthritis, Chest pain, Complication of anesthesia, Constipation, Depression, Fibromyalgia, Lung cancer (HCC), Neuropathy, PEG (percutaneous endoscopic gastrostomy) status (HCC), Pneumonia (01/13/2010), S/P radiation therapy (05/02/2013-06/22/2013), Tonsillar cancer (HCC), Type II diabetes mellitus (HCC), and Urethral stricture.   Surgical History:   Past Surgical History:  Procedure Laterality Date   CYSTOSCOPY     Dr. Vernie Ammons   GASTROSTOMY TUBE PLACEMENT  04/04/2013   LAPAROSCOPIC GASTROSTOMY N/A 04/04/2013   Procedure: LAPAROSCOPIC GASTROSTOMY TUBE PLACEMENT ;  Surgeon: Axel Filler, MD;  Location: [facility] OR;  Service: General;  Laterality: N/A;   MULTIPLE EXTRACTIONS WITH ALVEOLOPLASTY N/A 04/14/2013   Procedure: Extraction of tooth #'s 1,2,3,4,5,6,7,8,9,10,11,12,13,14,15,17,18,19,20,21,22,23,24,25,26,27,28,29, 30, 31, and 32 with alveoloplasty and bilateral mandibular tori reductions.;  Surgeon: Charlynne Pander, DDS;  Location: [facility] OR;  Service: Oral Surgery;  Laterality:  N/A;   NASAL HEMORRHAGE CONTROL N/A 04/04/2013   Procedure: Control of oropharyngeal hemorrhage;  Surgeon: Darletta Moll, MD;  Location: [facility] OR;  Service: ENT;  Laterality: N/A;   PORTACATH PLACEMENT Right 04/04/2013   PORTACATH PLACEMENT N/A 04/04/2013   Procedure: INSERTION PORT-A-CATH;  Surgeon: Axel Filler, MD;  Location: [facility];  Service: General;  Laterality: N/A;   TOTAL HIP ARTHROPLASTY Right 2000   Dr. Eulah Pont     Social History:   reports that he has never smoked. He has never used smokeless tobacco. He reports that he does not drink alcohol and does not use drugs.   Family History:  His family history includes Cancer in his cousin and mother; Hyperlipidemia in his brother.   Allergies Allergies  Allergen Reactions   Neurontin [Gabapentin] Other (See Comments)    Causes seizures   Buspirone     Lips swelling    Dilaudid [Hydromorphone Hcl] Other (See Comments)    Oxygen saturation drops     Home Medications  Prior to Admission medications   Medication Sig Start Date End Date Taking? Authorizing Provider  alprazolam Prudy Feeler) 2 MG tablet Take 2 mg by mouth 3 (three) times daily as needed for sleep. 08/02/12  Yes [provider]  citalopram (CELEXA) 40 MG tablet Take 1 tablet by mouth daily. 02/18/17  Yes [provider]  rosuvastatin (CRESTOR) 10 MG tablet Take 10 mg by mouth at bedtime. 03/15/23  Yes [provider]     Critical care time:      The patient is critically ill due to acute respiratory failure with hypoxia/aspiration pneumonia/acute UTI.  Critical care was necessary to treat or prevent imminent or life-threatening deterioration.  Critical care was time spent personally by me on the following activities: development of treatment plan with patient and/or surrogate as well as nursing, discussions with consultants, evaluation of patient's response to treatment, examination of patient, obtaining history from patient or surrogate, ordering and  performing treatments and interventions, ordering and review of laboratory studies, ordering and  review of radiographic studies, pulse oximetry, re-evaluation of patient's condition and participation in multidisciplinary rounds.   During this encounter critical care time was devoted to patient care services described in this note for 36 minutes.     Cheri Fowler, MD  Pulmonary Critical Care See Amion for pager If no response to pager, please call [phone] until 7pm After 7pm, Please call E-link [phone]

## 2023-03-25 NOTE — ED Notes (Signed)
 Dr. Dione Booze - optho at bedside

## 2023-03-25 NOTE — Consult Note (Signed)
 Reason for Consult:Right hip fx Referring Physician: Lysle Rubens Time called: 0803 Time at bedside: 0858   [name] is an 73 y.o. male.  HPI: [name] tripped and fell yesterday and landed on his face. He was taken to [facility] and found to have a periprosthetic right hip fx in addition to other injuries. He was transferred to [facility] for further treatment and orthopedic surgery was consulted the next morning. He lives at home alone and ambulates with a cane.  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 Vernie Ammons   GASTROSTOMY TUBE PLACEMENT  04/04/2013   LAPAROSCOPIC GASTROSTOMY N/A 04/04/2013   Procedure: LAPAROSCOPIC GASTROSTOMY TUBE PLACEMENT ;  Surgeon: Axel Filler, MD;  Location: [facility] OR;  Service: General;  Laterality: N/A;   MULTIPLE EXTRACTIONS WITH ALVEOLOPLASTY N/A 04/14/2013   Procedure: Extraction of tooth #'s 1,2,3,4,5,6,7,8,9,10,11,12,13,14,15,17,18,19,20,21,22,23,24,25,26,27,28,29, 30, 31, and 32 with alveoloplasty and bilateral mandibular tori reductions.;  Surgeon: Charlynne Pander, DDS;  Location: [facility] OR;  Service: Oral Surgery;  Laterality: N/A;   NASAL HEMORRHAGE CONTROL N/A 04/04/2013   Procedure: Control of oropharyngeal hemorrhage;  Surgeon: Darletta Moll, MD;  Location: [facility] OR;  Service: ENT;   Laterality: N/A;   PORTACATH PLACEMENT Right 04/04/2013   PORTACATH PLACEMENT N/A 04/04/2013   Procedure: INSERTION PORT-A-CATH;  Surgeon: Axel Filler, MD;  Location: [facility] OR;  Service: General;  Laterality: N/A;   TOTAL HIP ARTHROPLASTY Right 2000   Dr. Eulah Pont    Family History  Problem Relation Age of Onset   Cancer Cousin        living brain cancer, male   Cancer Mother        Deceased with leukemia, had uterine ca   Hyperlipidemia Brother     Social History:  reports that he has never smoked. He has never used smokeless tobacco. He reports that he does not drink alcohol and does not use drugs.  Allergies:  Allergies  Allergen Reactions   Neurontin [Gabapentin] Other (See Comments)    Causes seizures   Buspirone     Lips swelling    Dilaudid [Hydromorphone Hcl] Other (See Comments)    Oxygen saturation drops    Medications: I have reviewed the patient's current medications.  Results for orders placed or performed during the hospital encounter of 03/24/23 (from the past 48 hours)  CBC with Differential     Status: Abnormal   Collection Time: 03/24/23  1:39 PM  Result Value Ref Range   WBC 9.0 4.0 - 10.5 K/uL   RBC 4.80 4.22 - 5.81 MIL/uL   Hemoglobin 14.6 13.0 - 17.0 g/dL   HCT 16.1 09.6 - 04.5 %   MCV  93.5 80.0 - 100.0 fL   MCH 30.4 26.0 - 34.0 pg   MCHC 32.5 30.0 - 36.0 g/dL   RDW 78.2 95.6 - 21.3 %   Platelets 180 150 - 400 K/uL   nRBC 0.0 0.0 - 0.2 %   Neutrophils Relative % 90 %   Neutro Abs 8.1 (H) 1.7 - 7.7 K/uL   Lymphocytes Relative 4 %   Lymphs Abs 0.4 (L) 0.7 - 4.0 K/uL   Monocytes Relative 4 %   Monocytes Absolute 0.4 0.1 - 1.0 K/uL   Eosinophils Relative 1 %   Eosinophils Absolute 0.1 0.0 - 0.5 K/uL   Basophils Relative 0 %   Basophils Absolute 0.0 0.0 - 0.1 K/uL   Immature Granulocytes 1 %   Abs Immature Granulocytes 0.07 0.00 - 0.07 K/uL    Comment: Performed at [facility], [address], [place], [place] [address]  Comprehensive  metabolic panel     Status: Abnormal   Collection Time: 03/24/23  1:39 PM  Result Value Ref Range   Sodium 139 135 - 145 mmol/L   Potassium 4.0 3.5 - 5.1 mmol/L   Chloride 97 (L) 98 - 111 mmol/L   CO2 30 22 - 32 mmol/L   Glucose, Bld 127 (H) 70 - 99 mg/dL    Comment: Glucose reference range applies only to samples taken after fasting for at least 8 hours.   BUN 29 (H) 8 - 23 mg/dL   Creatinine, Ser 8.46 0.61 - 1.24 mg/dL   Calcium 9.6 8.9 - 96.2 mg/dL   Total Protein 8.0 6.5 - 8.1 g/dL   Albumin 4.3 3.5 - 5.0 g/dL   AST 21 15 - 41 U/L   ALT 15 0 - 44 U/L   Alkaline Phosphatase 89 38 - 126 U/L   Total Bilirubin 0.9 0.0 - 1.2 mg/dL   GFR, Estimated >95 >28 mL/min    Comment: (NOTE) Calculated using the CKD-EPI Creatinine Equation (2021)    Anion gap 12 5 - 15    Comment: Performed at [facility], [address], [place], [place] [address]  Resp panel by RT-PCR (RSV, Flu A&B, Covid) Anterior Nasal Swab     Status: None   Collection Time: 03/24/23  3:55 PM   Specimen: Anterior Nasal Swab  Result Value Ref Range   SARS Coronavirus 2 by RT PCR NEGATIVE NEGATIVE    Comment: (NOTE) SARS-CoV-2 target nucleic acids are NOT DETECTED.  The SARS-CoV-2 RNA is generally detectable in upper respiratory specimens during the acute phase of infection. The lowest concentration of SARS-CoV-2 viral copies this assay can detect is 138 copies/mL. A negative result does not preclude SARS-Cov-2 infection and should not be used as the sole basis for treatment or other patient management decisions. A negative result may occur with  improper specimen collection/handling, submission of specimen other than nasopharyngeal swab, presence of viral mutation(s) within the areas targeted by this assay, and inadequate number of viral copies(<138 copies/mL). A negative result must be combined with clinical observations, patient history, and epidemiological information. The expected result is Negative.  Fact  Sheet for Patients:  BloggerCourse.com  Fact Sheet for Healthcare Providers:  SeriousBroker.it  This test is no t yet approved or cleared by the [place] FDA and  has been authorized for detection and/or diagnosis of SARS-CoV-2 by FDA under an Emergency Use Authorization (EUA). This EUA will remain  in effect (meaning this test can be used) for the duration of the COVID-19 declaration under Section  564(b)(1) of the Act, 21 U.S.C.section 360bbb-3(b)(1), unless the authorization is terminated  or revoked sooner.       Influenza A by PCR NEGATIVE NEGATIVE   Influenza B by PCR NEGATIVE NEGATIVE    Comment: (NOTE) The Xpert Xpress SARS-CoV-2/FLU/RSV plus assay is intended as an aid in the diagnosis of influenza from Nasopharyngeal swab specimens and should not be used as a sole basis for treatment. Nasal washings and aspirates are unacceptable for Xpert Xpress SARS-CoV-2/FLU/RSV testing.  Fact Sheet for Patients: BloggerCourse.com  Fact Sheet for Healthcare Providers: SeriousBroker.it  This test is not yet approved or cleared by the [place] FDA and has been authorized for detection and/or diagnosis of SARS-CoV-2 by FDA under an Emergency Use Authorization (EUA). This EUA will remain in effect (meaning this test can be used) for the duration of the COVID-19 declaration under Section 564(b)(1) of the Act, 21 U.S.C. section 360bbb-3(b)(1), unless the authorization is terminated or revoked.     Resp Syncytial Virus by PCR NEGATIVE NEGATIVE    Comment: (NOTE) Fact Sheet for Patients: BloggerCourse.com  Fact Sheet for Healthcare Providers: SeriousBroker.it  This test is not yet approved or cleared by the [place] FDA and has been authorized for detection and/or diagnosis of SARS-CoV-2 by FDA under an Emergency Use  Authorization (EUA). This EUA will remain in effect (meaning this test can be used) for the duration of the COVID-19 declaration under Section 564(b)(1) of the Act, 21 U.S.C. section 360bbb-3(b)(1), unless the authorization is terminated or revoked.  Performed at [facility], [address] [place], [place] [address]   Urinalysis, Routine w reflex microscopic -Urine, Clean Catch     Status: Abnormal   Collection Time: 03/24/23  4:45 PM  Result Value Ref Range   Color, Urine YELLOW YELLOW   APPearance HAZY (A) CLEAR   Specific Gravity, Urine 1.021 1.005 - 1.030   pH 5.0 5.0 - 8.0   Glucose, UA >=500 (A) NEGATIVE mg/dL   Hgb urine dipstick NEGATIVE NEGATIVE   Bilirubin Urine NEGATIVE NEGATIVE   Ketones, ur 20 (A) NEGATIVE mg/dL   Protein, ur 604 (A) NEGATIVE mg/dL   Nitrite POSITIVE (A) NEGATIVE   Leukocytes,Ua SMALL (A) NEGATIVE   RBC / HPF 0-5 0 - 5 RBC/hpf   WBC, UA >50 0 - 5 WBC/hpf   Bacteria, UA MANY (A) NONE SEEN   Squamous Epithelial / HPF 0-5 0 - 5 /HPF   Mucus PRESENT     Comment: Performed at [facility], [address], [place], [place] [address]  CBC     Status: Abnormal   Collection Time: 03/24/23  7:50 PM  Result Value Ref Range   WBC 15.5 (H) 4.0 - 10.5 K/uL   RBC 3.94 (L) 4.22 - 5.81 MIL/uL   Hemoglobin 12.0 (L) 13.0 - 17.0 g/dL   HCT 11.9 (L) 14.7 - 82.9 %   MCV 92.6 80.0 - 100.0 fL   MCH 30.5 26.0 - 34.0 pg   MCHC 32.9 30.0 - 36.0 g/dL   RDW 56.2 13.0 - 86.5 %   Platelets 170 150 - 400 K/uL   nRBC 0.0 0.0 - 0.2 %    Comment: Performed at [facility], [address] [place], [place] [address]  Lactic acid, plasma     Status: None   Collection Time: 03/24/23  8:30 PM  Result Value Ref Range   Lactic Acid, Venous 1.8 0.5 - 1.9 mmol/L    Comment: Performed at [facility], [address], [place], [place] [address]  Protime-INR     Status: None   Collection Time: 03/24/23  8:30 PM  Result Value Ref Range   Prothrombin Time 14.5 11.4 - 15.2 seconds    INR 1.1 0.8 - 1.2    Comment: (NOTE) INR goal varies based on device and disease states. Performed at [facility], [address] [place], [place] [address]   APTT     Status: None   Collection Time: 03/24/23  8:30 PM  Result Value Ref Range   aPTT 33 24 - 36 seconds    Comment: Performed at [facility], [address], [place], [place] [address]  Blood Culture (routine x 2)     Status: None (Preliminary result)   Collection Time: 03/24/23  8:30 PM   Specimen: BLOOD RIGHT FOREARM  Result Value Ref Range   Specimen Description      BLOOD RIGHT FOREARM BOTTLES DRAWN AEROBIC AND ANAEROBIC   Special Requests      Blood Culture results may not be optimal due to an inadequate volume of blood received in culture bottles   Culture      NO GROWTH < 12 HOURS Performed at [facility], [address], [place], [place] [address]    Report Status PENDING   Blood Culture (routine x 2)     Status: None (Preliminary result)   Collection Time: 03/24/23  8:33 PM   Specimen: BLOOD LEFT HAND  Result Value Ref Range   Specimen Description      BLOOD LEFT HAND BOTTLES DRAWN AEROBIC AND ANAEROBIC   Special Requests      Blood Culture results may not be optimal due to an inadequate volume of blood received in culture bottles   Culture      NO GROWTH < 12 HOURS Performed at [facility], [address], [place], [place] [address]    Report Status PENDING   CBC     Status: Abnormal   Collection Time: 03/24/23 11:42 PM  Result Value Ref Range   WBC 12.7 (H) 4.0 - 10.5 K/uL   RBC 3.14 (L) 4.22 - 5.81 MIL/uL   Hemoglobin 9.5 (L) 13.0 - 17.0 g/dL   HCT 84.6 (L) 96.2 - 95.2 %   MCV 103.2 (H) 80.0 - 100.0 fL   MCH 30.3 26.0 - 34.0 pg   MCHC 29.3 (L) 30.0 - 36.0 g/dL   RDW 84.1 32.4 - 40.1 %   Platelets 119 (L) 150 - 400 K/uL   nRBC 0.0 0.0 - 0.2 %    Comment: Performed at [facility], [address] [place], [place] [address]  Creatinine, serum     Status: None   Collection Time: 03/24/23  11:42 PM  Result Value Ref Range   Creatinine, Ser 1.09 0.61 - 1.24 mg/dL   GFR, Estimated >36 >64 mL/min    Comment: (NOTE) Calculated using the CKD-EPI Creatinine Equation (2021) Performed at [facility], [address] [place], [place] [address]   CBC with Differential     Status: Abnormal   Collection Time: 03/25/23  3:34 AM  Result Value Ref Range   WBC 11.5 (H) 4.0 - 10.5 K/uL   RBC 2.95 (L) 4.22 - 5.81 MIL/uL   Hemoglobin 9.1 (L) 13.0 - 17.0 g/dL   HCT 42.5 (L) 95.6 - 38.7 %   MCV 93.6 80.0 - 100.0 fL    Comment: DELTA CHECK NOTED   MCH 30.8 26.0 - 34.0 pg  MCHC 33.0 30.0 - 36.0 g/dL   RDW 69.6 29.5 - 28.4 %   Platelets 119 (L) 150 - 400 K/uL    Comment: REPEATED TO VERIFY   nRBC 0.0 0.0 - 0.2 %   Neutrophils Relative % 87 %   Neutro Abs 10.1 (H) 1.7 - 7.7 K/uL   Lymphocytes Relative 4 %   Lymphs Abs 0.5 (L) 0.7 - 4.0 K/uL   Monocytes Relative 6 %   Monocytes Absolute 0.7 0.1 - 1.0 K/uL   Eosinophils Relative 2 %   Eosinophils Absolute 0.2 0.0 - 0.5 K/uL   Basophils Relative 0 %   Basophils Absolute 0.1 0.0 - 0.1 K/uL   Immature Granulocytes 1 %   Abs Immature Granulocytes 0.06 0.00 - 0.07 K/uL    Comment: Performed at [facility], [address] [place], [place] [address]  Basic metabolic panel     Status: Abnormal   Collection Time: 03/25/23  4:17 AM  Result Value Ref Range   Sodium 139 135 - 145 mmol/L   Potassium 4.1 3.5 - 5.1 mmol/L   Chloride 107 98 - 111 mmol/L   CO2 26 22 - 32 mmol/L   Glucose, Bld 110 (H) 70 - 99 mg/dL    Comment: Glucose reference range applies only to samples taken after fasting for at least 8 hours.   BUN 28 (H) 8 - 23 mg/dL   Creatinine, Ser 0.10 0.61 - 1.24 mg/dL   Calcium 7.9 (L) 8.9 - 10.3 mg/dL   GFR, Estimated >27 >25 mL/min    Comment: (NOTE) Calculated using the CKD-EPI Creatinine Equation (2021)    Anion gap 6 5 - 15    Comment: Performed at [facility], [address], [place], [place]  [address]  Troponin I (High Sensitivity)     Status: None   Collection Time: 03/25/23  4:17 AM  Result Value Ref Range   Troponin I (High Sensitivity) 12 <18 ng/L    Comment: (NOTE) Elevated high sensitivity troponin I (hsTnI) values and significant  changes across serial measurements may suggest ACS but many other  chronic and acute conditions are known to elevate hsTnI results.  Refer to the "Links" section for chest pain algorithms and additional  guidance. Performed at [facility] [facility], [address], [place], [place] [address]     CT CHEST ABDOMEN PELVIS W CONTRAST Result Date: 03/25/2023 CLINICAL DATA:  Trauma.  Status post fall EXAM: CT CHEST, ABDOMEN, AND PELVIS WITH CONTRAST TECHNIQUE: Multidetector CT imaging of the chest, abdomen and pelvis was performed following the standard protocol during bolus administration of intravenous contrast. RADIATION DOSE REDUCTION: This exam was performed according to the departmental dose-optimization program which includes automated exposure control, adjustment of the mA and/or kV according to patient size and/or use of iterative reconstruction technique. CONTRAST:  75mL OMNIPAQUE IOHEXOL 350 MG/ML SOLN COMPARISON:  CT chest 03/18/2023. CT chest, abdomen and pelvis 12/08/2017 FINDINGS: CT CHEST FINDINGS Cardiovascular: Heart size is normal. No pericardial effusion. The thoracic aorta appears intact. Aortic atherosclerosis and coronary artery calcifications. Mediastinum/Nodes: 2.9 cm heterogeneous nodule in left lobe of thyroid gland. This has been evaluated on previous imaging. (ref: J Am Coll Radiol. 2015 Feb;12(2): 143-50).The trachea appears patent and midline. Esophagus is unremarkable. No enlarged mediastinal or hilar lymph nodes. Lungs/Pleura: Opacification of the bronchus intermedius and right lower lobe airways identified compatible with aspiration and/or mucoid impaction. There is new airspace consolidation within the posterior right lower lobe is  compatible with either aspiration and/or pneumonia. Partial atelectasis of the right lower lobe is also present. Mild subsegmental atelectasis within the posterior left lower lobe. Bilateral apical pleuroparenchymal scarring appears similar to previous exam. No pneumothorax. No pleural effusion. Musculoskeletal: Healed left anterior rib fractures. No acute or suspicious osseous findings. CT ABDOMEN PELVIS FINDINGS Hepatobiliary: No signs of hepatic injury or perihepatic hematoma. Sludge and multiple stones identified within the gallbladder. Gallstones measure up to 7 mm. No gallbladder wall thickening or inflammation. No bile duct dilatation. Pancreas: Unremarkable. No pancreatic ductal dilatation or surrounding inflammatory changes. Spleen: Normal in size without focal abnormality. Adrenals/Urinary Tract: Adrenal glands are unremarkable. Kidneys are normal, without renal calculi, focal lesion, or hydronephrosis. The bladder is decompressed containing 5 calcified stones which measure up to 2 cm. Stomach/Bowel: Stomach is within normal limits. Appendix appears normal. No evidence of bowel wall thickening, distention, or inflammatory changes. Vascular/Lymphatic: Aortic atherosclerosis. No aneurysm. The upper abdominal vascularity appears patent. Gastrohepatic ligament node measures 1.1 cm, image 69/3. No additional enlarged abdominopelvic lymph nodes. Reproductive: Prostate gland enlargement. Other: No free fluid or fluid collections. No signs of pneumoperitoneum. Musculoskeletal: Right hip arthroplasty. No acute or suspicious osseous findings. Lumbar degenerative disc disease. There is a nondisplaced periprosthetic fracture involving the intertrochanteric portions of the proximal right femur. No additional fractures identified. IMPRESSION: 1. Nondisplaced periprosthetic fracture involving the intertrochanteric portions of the proximal right femur. 2. Opacification of the bronchus intermedius and right lower lobe  airways compatible with aspiration. 3. New airspace consolidation within the posterior right lower lobe compatible with aspiration and/or pneumonia. 4. Cholelithiasis. 5. Bladder stones. 6. Prostate gland enlargement. 7.  Aortic Atherosclerosis (ICD10-I70.0). Electronically Signed   By: Signa Kell M.D.   On: 03/25/2023 05:38   DG Pelvis 1-2 Views Result Date: 03/24/2023 CLINICAL DATA:  Trauma, fall, right hip pain. EXAM: PELVIS - 1-2 VIEW COMPARISON:  None Available. FINDINGS: Pelvis: Right hip arthroplasty. Minimal cortical irregularity about the lateral proximal femoral cortex just below the greater trochanter may represent a nondisplaced fracture. No other pelvic fracture. Pubic rami are intact. Pubic symphysis and sacroiliac joints are congruent. Multiple lamellated calcifications in the midline may represent large bladder stones. Right femur: Cortical irregularity about the lateral femur demonstrated on the lateral view. No other fracture. Distal femur is intact. Knee alignment is maintained. No knee joint effusion. No focal soft tissue abnormalities. IMPRESSION: 1. Right hip arthroplasty. Minimal cortical irregularity about the lateral proximal femoral cortex may represent a nondisplaced fracture. 2. Remainder the femurs intact. 3. Multiple lamellated calcifications in the pelvis midline may represent large bladder stones. Electronically Signed   By: Narda Rutherford M.D.   On: 03/24/2023 23:54   CT Head Wo Contrast Addendum Date: 03/24/2023 ADDENDUM REPORT: 03/24/2023 16:18 ADDENDUM: Acute findings discussed with Dr. Estell Harpin by telephone at approximately 3:15 p.m. on 03/24/2023. Electronically Signed   By: Jackey Loge D.O.   On: 03/24/2023 16:18   Result Date: 03/24/2023 CLINICAL DATA:  Provided history: Head trauma, moderate/severe. Facial trauma, blunt. Neck trauma, intoxicated or obtunded. Additional interbody: Fall (striking face), right eye swelling, right eye laceration. EXAM: CT HEAD  WITHOUT CONTRAST CT MAXILLOFACIAL WITHOUT CONTRAST CT CERVICAL SPINE WITHOUT CONTRAST TECHNIQUE: Multidetector CT imaging of the head, cervical spine, and maxillofacial structures were performed using the standard protocol without intravenous contrast. Multiplanar CT image reconstructions of the cervical spine and maxillofacial structures were also generated. RADIATION DOSE REDUCTION: This exam was performed according to the departmental dose-optimization program which includes automated exposure  control, adjustment of the mA and/or kV according to patient size and/or use of iterative reconstruction technique. COMPARISON:  Head CT 03/18/2023. Cervical spine CT 03/18/2023. Report from thyroid ultrasound 03/22/2013. FINDINGS: CT HEAD FINDINGS Brain: Generalized cerebral atrophy. Patchy and ill-defined hypoattenuation within the cerebral white matter, nonspecific but compatible with mild-to-moderate chronic small vessel ischemic disease. There is no acute intracranial hemorrhage. No demarcated cortical infarct. No extra-axial fluid collection. No evidence of an intracranial mass. No midline shift. Vascular: No hyperdense vessel.  Atherosclerotic calcifications. Skull: Acute, comminuted and displaced fracture of the lateral wall of the right bony orbit, extending into the adjacent right sphenoid bone. Adjacent minimally displaced and nondisplaced fractures within the right orbital roof/right frontal calvarium (for instance as seen on maxillofacial CT, image 25) (maxillofacial CT series 4, images 19-21). Additional nondisplaced acute fracture of the right frontal calvarium immediately above the superior orbital rim (for instance as seen on maxillofacial CT series 5, image 11). Other: Right frontal, parietal and temporal scalp hematoma. CT MAXILLOFACIAL FINDINGS Osseous: Acute comminuted and displaced fracture of the lateral wall of the right bony orbit. A mildly displaced fracture extends from the site into the adjacent  right sphenoid bone. Additionally, there are adjacent minimally displaced and nondisplaced fractures within the right orbital roof/right frontal calvarium. Acute comminuted and displaced fracture of the right orbital floor (with depression of fracture fragments by up to 6 mm). Suspected acute, nondisplaced fracture of the right lamina papyracea (for instance as seen on series 9, image 31). Acute comminuted and displaced fractures of the anterior and posterolateral walls of the right maxillary sinus. Acute fractures also extend into the floor of the right maxillary sinus/maxillary alveolar process. No definite acute fracture of the medial wall of the right maxillary sinus is identified. Acute mildly displaced fracture of the right zygomatic arch. Orbits: Acute right orbital fractures as described above. Extensive gas within the right orbit and right periorbital region. Extraconal and intraconal/retrobulbar hemorrhage within the right orbit. Hemorrhage surrounds the right lateral and inferior rectus muscles. Right proptosis. Sinuses: Extensive hyperdense opacification of the right maxillary sinus consistent with the presence of hemorrhage. Mild nonspecific partial opacification of right ethmoid air cells. Soft tissues: Right periorbital and facial hematoma. Extensive soft tissue gas in the right periorbital region and right face. Other: Nonspecific partial opacification of the right nasal passage. CT CERVICAL SPINE FINDINGS Alignment: Dextrocurvature of the cervical spine. No significant spondylolisthesis. Skull base and vertebrae: The basion-dental and atlanto-dental intervals are maintained.No evidence of acute fracture to the cervical spine. C6-C7 vertebral body ankylosis. Soft tissues and spinal canal: No prevertebral fluid or swelling. No visible canal hematoma. Incompletely imaged left thyroid lobe nodule. Per radiology records, this nodule was previously biopsied on 03/22/2013. Correlate with the pathology  report from this prior procedure. Disc levels: Cervical spondylosis with multilevel disc space narrowing, disc bulges/disc protrusions, posterior disc osteophyte complexes, uncovertebral hypertrophy and facet arthropathy. Fusion across the C6-C7 disc space. At the non-fused levels, disc space narrowing is greatest at C5-C6 (advanced at this level). No appreciable high-grade spinal canal stenosis. Multilevel bony neural from narrowing. Degenerative changes also present at the C1-C2 articulation. Upper chest: No acute finding. Incompletely imaged, but known, left upper lobe pulmonary nodule. Attempts are being made to reach the ordering provider at this time. IMPRESSION: CT head: 1.  No evidence of an acute intracranial abnormality. 2. Acute, nondisplaced fracture of the right frontal calvarium (immediately above the right superior orbital rim). Acute orbital/facial fractures with fractures extending into  the calvarium as described in the maxillofacial findings/impression section. 3. Right frontal, parietal and temporal scalp hematoma. 4. Parenchymal atrophy and chronic small vessel ischemic disease. CT maxillofacial: 1. Acute comminuted and displaced fracture of the lateral wall of the right bony orbit. A mildly displaced fracture extends from the site into the adjacent right sphenoid bone. 2. Adjacent minimally displaced and nondisplaced fractures within the right orbital roof/right frontal calvarium. 3. Acute comminuted and displaced fracture of the right orbital floor, extending into the inferior orbital rim (with depression of fracture fragments by up to 6 mm). 4. Suspected acute, nondisplaced fracture of the right lamina papyracea , 5. Extensive acute comminuted and displaced fractures of the anterior and posterolateral walls of the right maxillary sinus. Acute fractures also extend into the floor of the right maxillary sinus/maxillary alveolar process. 6. Acute mildly displaced fracture of the right zygomatic  arch. 7. Extraconal and intraconal/retrobulbar hemorrhage within the right orbit. Hemorrhage surrounds the right lateral and inferior rectus muscles. Correlate with physical exam findings to exclude extraocular muscle entrapment. 8. Extensive gas within the right orbit. 9. Right proptosis. 10. Right periorbital/facial hematoma and subcutaneous gas. CT cervical spine: 1. No evidence of acute cervical spine fracture. 2. Dextrocurvature of the cervical spine. 3. C6-C7 vertebral ankylosis. 4. Cervical spondylosis as described. Electronically Signed: By: Jackey Loge D.O. On: 03/24/2023 15:13   CT Cervical Spine Wo Contrast Addendum Date: 03/24/2023 ADDENDUM REPORT: 03/24/2023 16:18 ADDENDUM: Acute findings discussed with Dr. Estell Harpin by telephone at approximately 3:15 p.m. on 03/24/2023. Electronically Signed   By: Jackey Loge D.O.   On: 03/24/2023 16:18   Result Date: 03/24/2023 CLINICAL DATA:  Provided history: Head trauma, moderate/severe. Facial trauma, blunt. Neck trauma, intoxicated or obtunded. Additional interbody: Fall (striking face), right eye swelling, right eye laceration. EXAM: CT HEAD WITHOUT CONTRAST CT MAXILLOFACIAL WITHOUT CONTRAST CT CERVICAL SPINE WITHOUT CONTRAST TECHNIQUE: Multidetector CT imaging of the head, cervical spine, and maxillofacial structures were performed using the standard protocol without intravenous contrast. Multiplanar CT image reconstructions of the cervical spine and maxillofacial structures were also generated. RADIATION DOSE REDUCTION: This exam was performed according to the departmental dose-optimization program which includes automated exposure control, adjustment of the mA and/or kV according to patient size and/or use of iterative reconstruction technique. COMPARISON:  Head CT 03/18/2023. Cervical spine CT 03/18/2023. Report from thyroid ultrasound 03/22/2013. FINDINGS: CT HEAD FINDINGS Brain: Generalized cerebral atrophy. Patchy and ill-defined hypoattenuation  within the cerebral white matter, nonspecific but compatible with mild-to-moderate chronic small vessel ischemic disease. There is no acute intracranial hemorrhage. No demarcated cortical infarct. No extra-axial fluid collection. No evidence of an intracranial mass. No midline shift. Vascular: No hyperdense vessel.  Atherosclerotic calcifications. Skull: Acute, comminuted and displaced fracture of the lateral wall of the right bony orbit, extending into the adjacent right sphenoid bone. Adjacent minimally displaced and nondisplaced fractures within the right orbital roof/right frontal calvarium (for instance as seen on maxillofacial CT, image 25) (maxillofacial CT series 4, images 19-21). Additional nondisplaced acute fracture of the right frontal calvarium immediately above the superior orbital rim (for instance as seen on maxillofacial CT series 5, image 11). Other: Right frontal, parietal and temporal scalp hematoma. CT MAXILLOFACIAL FINDINGS Osseous: Acute comminuted and displaced fracture of the lateral wall of the right bony orbit. A mildly displaced fracture extends from the site into the adjacent right sphenoid bone. Additionally, there are adjacent minimally displaced and nondisplaced fractures within the right orbital roof/right frontal calvarium. Acute comminuted and displaced fracture  of the right orbital floor (with depression of fracture fragments by up to 6 mm). Suspected acute, nondisplaced fracture of the right lamina papyracea (for instance as seen on series 9, image 31). Acute comminuted and displaced fractures of the anterior and posterolateral walls of the right maxillary sinus. Acute fractures also extend into the floor of the right maxillary sinus/maxillary alveolar process. No definite acute fracture of the medial wall of the right maxillary sinus is identified. Acute mildly displaced fracture of the right zygomatic arch. Orbits: Acute right orbital fractures as described above. Extensive gas  within the right orbit and right periorbital region. Extraconal and intraconal/retrobulbar hemorrhage within the right orbit. Hemorrhage surrounds the right lateral and inferior rectus muscles. Right proptosis. Sinuses: Extensive hyperdense opacification of the right maxillary sinus consistent with the presence of hemorrhage. Mild nonspecific partial opacification of right ethmoid air cells. Soft tissues: Right periorbital and facial hematoma. Extensive soft tissue gas in the right periorbital region and right face. Other: Nonspecific partial opacification of the right nasal passage. CT CERVICAL SPINE FINDINGS Alignment: Dextrocurvature of the cervical spine. No significant spondylolisthesis. Skull base and vertebrae: The basion-dental and atlanto-dental intervals are maintained.No evidence of acute fracture to the cervical spine. C6-C7 vertebral body ankylosis. Soft tissues and spinal canal: No prevertebral fluid or swelling. No visible canal hematoma. Incompletely imaged left thyroid lobe nodule. Per radiology records, this nodule was previously biopsied on 03/22/2013. Correlate with the pathology report from this prior procedure. Disc levels: Cervical spondylosis with multilevel disc space narrowing, disc bulges/disc protrusions, posterior disc osteophyte complexes, uncovertebral hypertrophy and facet arthropathy. Fusion across the C6-C7 disc space. At the non-fused levels, disc space narrowing is greatest at C5-C6 (advanced at this level). No appreciable high-grade spinal canal stenosis. Multilevel bony neural from narrowing. Degenerative changes also present at the C1-C2 articulation. Upper chest: No acute finding. Incompletely imaged, but known, left upper lobe pulmonary nodule. Attempts are being made to reach the ordering provider at this time. IMPRESSION: CT head: 1.  No evidence of an acute intracranial abnormality. 2. Acute, nondisplaced fracture of the right frontal calvarium (immediately above the right  superior orbital rim). Acute orbital/facial fractures with fractures extending into the calvarium as described in the maxillofacial findings/impression section. 3. Right frontal, parietal and temporal scalp hematoma. 4. Parenchymal atrophy and chronic small vessel ischemic disease. CT maxillofacial: 1. Acute comminuted and displaced fracture of the lateral wall of the right bony orbit. A mildly displaced fracture extends from the site into the adjacent right sphenoid bone. 2. Adjacent minimally displaced and nondisplaced fractures within the right orbital roof/right frontal calvarium. 3. Acute comminuted and displaced fracture of the right orbital floor, extending into the inferior orbital rim (with depression of fracture fragments by up to 6 mm). 4. Suspected acute, nondisplaced fracture of the right lamina papyracea , 5. Extensive acute comminuted and displaced fractures of the anterior and posterolateral walls of the right maxillary sinus. Acute fractures also extend into the floor of the right maxillary sinus/maxillary alveolar process. 6. Acute mildly displaced fracture of the right zygomatic arch. 7. Extraconal and intraconal/retrobulbar hemorrhage within the right orbit. Hemorrhage surrounds the right lateral and inferior rectus muscles. Correlate with physical exam findings to exclude extraocular muscle entrapment. 8. Extensive gas within the right orbit. 9. Right proptosis. 10. Right periorbital/facial hematoma and subcutaneous gas. CT cervical spine: 1. No evidence of acute cervical spine fracture. 2. Dextrocurvature of the cervical spine. 3. C6-C7 vertebral ankylosis. 4. Cervical spondylosis as described. Electronically Signed: By:  Jackey Loge D.O. On: 03/24/2023 15:13   CT Maxillofacial Wo Contrast Addendum Date: 03/24/2023 ADDENDUM REPORT: 03/24/2023 16:18 ADDENDUM: Acute findings discussed with Dr. Estell Harpin by telephone at approximately 3:15 p.m. on 03/24/2023. Electronically Signed   By: Jackey Loge D.O.   On: 03/24/2023 16:18   Result Date: 03/24/2023 CLINICAL DATA:  Provided history: Head trauma, moderate/severe. Facial trauma, blunt. Neck trauma, intoxicated or obtunded. Additional interbody: Fall (striking face), right eye swelling, right eye laceration. EXAM: CT HEAD WITHOUT CONTRAST CT MAXILLOFACIAL WITHOUT CONTRAST CT CERVICAL SPINE WITHOUT CONTRAST TECHNIQUE: Multidetector CT imaging of the head, cervical spine, and maxillofacial structures were performed using the standard protocol without intravenous contrast. Multiplanar CT image reconstructions of the cervical spine and maxillofacial structures were also generated. RADIATION DOSE REDUCTION: This exam was performed according to the departmental dose-optimization program which includes automated exposure control, adjustment of the mA and/or kV according to patient size and/or use of iterative reconstruction technique. COMPARISON:  Head CT 03/18/2023. Cervical spine CT 03/18/2023. Report from thyroid ultrasound 03/22/2013. FINDINGS: CT HEAD FINDINGS Brain: Generalized cerebral atrophy. Patchy and ill-defined hypoattenuation within the cerebral white matter, nonspecific but compatible with mild-to-moderate chronic small vessel ischemic disease. There is no acute intracranial hemorrhage. No demarcated cortical infarct. No extra-axial fluid collection. No evidence of an intracranial mass. No midline shift. Vascular: No hyperdense vessel.  Atherosclerotic calcifications. Skull: Acute, comminuted and displaced fracture of the lateral wall of the right bony orbit, extending into the adjacent right sphenoid bone. Adjacent minimally displaced and nondisplaced fractures within the right orbital roof/right frontal calvarium (for instance as seen on maxillofacial CT, image 25) (maxillofacial CT series 4, images 19-21). Additional nondisplaced acute fracture of the right frontal calvarium immediately above the superior orbital rim (for instance as seen on  maxillofacial CT series 5, image 11). Other: Right frontal, parietal and temporal scalp hematoma. CT MAXILLOFACIAL FINDINGS Osseous: Acute comminuted and displaced fracture of the lateral wall of the right bony orbit. A mildly displaced fracture extends from the site into the adjacent right sphenoid bone. Additionally, there are adjacent minimally displaced and nondisplaced fractures within the right orbital roof/right frontal calvarium. Acute comminuted and displaced fracture of the right orbital floor (with depression of fracture fragments by up to 6 mm). Suspected acute, nondisplaced fracture of the right lamina papyracea (for instance as seen on series 9, image 31). Acute comminuted and displaced fractures of the anterior and posterolateral walls of the right maxillary sinus. Acute fractures also extend into the floor of the right maxillary sinus/maxillary alveolar process. No definite acute fracture of the medial wall of the right maxillary sinus is identified. Acute mildly displaced fracture of the right zygomatic arch. Orbits: Acute right orbital fractures as described above. Extensive gas within the right orbit and right periorbital region. Extraconal and intraconal/retrobulbar hemorrhage within the right orbit. Hemorrhage surrounds the right lateral and inferior rectus muscles. Right proptosis. Sinuses: Extensive hyperdense opacification of the right maxillary sinus consistent with the presence of hemorrhage. Mild nonspecific partial opacification of right ethmoid air cells. Soft tissues: Right periorbital and facial hematoma. Extensive soft tissue gas in the right periorbital region and right face. Other: Nonspecific partial opacification of the right nasal passage. CT CERVICAL SPINE FINDINGS Alignment: Dextrocurvature of the cervical spine. No significant spondylolisthesis. Skull base and vertebrae: The basion-dental and atlanto-dental intervals are maintained.No evidence of acute fracture to the cervical  spine. C6-C7 vertebral body ankylosis. Soft tissues and spinal canal: No prevertebral fluid or swelling. No visible canal  hematoma. Incompletely imaged left thyroid lobe nodule. Per radiology records, this nodule was previously biopsied on 03/22/2013. Correlate with the pathology report from this prior procedure. Disc levels: Cervical spondylosis with multilevel disc space narrowing, disc bulges/disc protrusions, posterior disc osteophyte complexes, uncovertebral hypertrophy and facet arthropathy. Fusion across the C6-C7 disc space. At the non-fused levels, disc space narrowing is greatest at C5-C6 (advanced at this level). No appreciable high-grade spinal canal stenosis. Multilevel bony neural from narrowing. Degenerative changes also present at the C1-C2 articulation. Upper chest: No acute finding. Incompletely imaged, but known, left upper lobe pulmonary nodule. Attempts are being made to reach the ordering provider at this time. IMPRESSION: CT head: 1.  No evidence of an acute intracranial abnormality. 2. Acute, nondisplaced fracture of the right frontal calvarium (immediately above the right superior orbital rim). Acute orbital/facial fractures with fractures extending into the calvarium as described in the maxillofacial findings/impression section. 3. Right frontal, parietal and temporal scalp hematoma. 4. Parenchymal atrophy and chronic small vessel ischemic disease. CT maxillofacial: 1. Acute comminuted and displaced fracture of the lateral wall of the right bony orbit. A mildly displaced fracture extends from the site into the adjacent right sphenoid bone. 2. Adjacent minimally displaced and nondisplaced fractures within the right orbital roof/right frontal calvarium. 3. Acute comminuted and displaced fracture of the right orbital floor, extending into the inferior orbital rim (with depression of fracture fragments by up to 6 mm). 4. Suspected acute, nondisplaced fracture of the right lamina papyracea , 5.  Extensive acute comminuted and displaced fractures of the anterior and posterolateral walls of the right maxillary sinus. Acute fractures also extend into the floor of the right maxillary sinus/maxillary alveolar process. 6. Acute mildly displaced fracture of the right zygomatic arch. 7. Extraconal and intraconal/retrobulbar hemorrhage within the right orbit. Hemorrhage surrounds the right lateral and inferior rectus muscles. Correlate with physical exam findings to exclude extraocular muscle entrapment. 8. Extensive gas within the right orbit. 9. Right proptosis. 10. Right periorbital/facial hematoma and subcutaneous gas. CT cervical spine: 1. No evidence of acute cervical spine fracture. 2. Dextrocurvature of the cervical spine. 3. C6-C7 vertebral ankylosis. 4. Cervical spondylosis as described. Electronically Signed: By: Jackey Loge D.O. On: 03/24/2023 15:13   DG Chest Port 1 View Result Date: 03/24/2023 CLINICAL DATA:  Shortness of breath EXAM: PORTABLE CHEST 1 VIEW COMPARISON:  07/13/2021 FINDINGS: Right-sided central venous port tip over the SVC. Hyperinflation. No acute airspace disease, pleural effusion or pneumothorax. Normal cardiomediastinal contour. IMPRESSION: No active disease. Hyperinflation. Electronically Signed   By: Jasmine Pang M.D.   On: 03/24/2023 15:51    Review of Systems  HENT:  Negative for ear discharge, ear pain, hearing loss and tinnitus.   Eyes:  Negative for photophobia and pain.  Respiratory:  Negative for cough and shortness of breath.   Cardiovascular:  Negative for chest pain.  Gastrointestinal:  Negative for abdominal pain, nausea and vomiting.  Genitourinary:  Negative for dysuria, flank pain, frequency and urgency.  Musculoskeletal:  Positive for arthralgias (Right hip). Negative for back pain, myalgias and neck pain.  Neurological:  Negative for dizziness and headaches.  Hematological:  Does not bruise/bleed easily.  Psychiatric/Behavioral:  The patient is not  nervous/anxious.    Blood pressure 107/72, pulse 73, temperature (!) 97.5 F (36.4 C), temperature source Tympanic, resp. rate 11, height 6\' 3"  (1.905 m), weight 80.7 kg, SpO2 99%. Physical Exam Constitutional:      General: He is not in acute distress.  Appearance: He is well-developed. He is not diaphoretic.  HENT:     Head: Normocephalic and atraumatic.  Eyes:     General: No scleral icterus.       Right eye: No discharge.        Left eye: No discharge.     Conjunctiva/sclera: Conjunctivae normal.  Cardiovascular:     Rate and Rhythm: Normal rate and regular rhythm.  Pulmonary:     Effort: Pulmonary effort is normal. No respiratory distress.  Musculoskeletal:     Cervical back: Normal range of motion.     Comments: RLE No traumatic wounds, ecchymosis, or rash  Mild TTP hip  No knee or ankle effusion  Knee stable to varus/ valgus and anterior/posterior stress  Sens DPN, SPN, TN intact  Motor EHL, ext, flex, evers 5/5  DP 1+, PT 1+, No significant edema  Skin:    General: Skin is warm and dry.  Neurological:     Mental Status: He is alert.  Psychiatric:        Mood and Affect: Mood normal.        Behavior: Behavior normal.     Assessment/Plan: Right hip fx -- Implant appears stable. May WBAT with RW, no active hip abduction. F/u with Dr. Blanchie Dessert in 2-3 weeks.    Freeman Caldron, PA-C Orthopedic Surgery [phone] 03/25/2023, 9:05 AM

## 2023-03-25 NOTE — ED Notes (Signed)
 Pt notified this RN of double vision in the right eye, new headache and increased BP. Trauma MD made aware

## 2023-03-25 NOTE — ED Notes (Signed)
 O2 turned off per trauma PA. Sats remain in 90'S. If sats drop into 80s, can place back on O2 per Barnetta Chapel, [facility]

## 2023-03-26 ENCOUNTER — Inpatient Hospital Stay (HOSPITAL_COMMUNITY)

## 2023-03-26 ENCOUNTER — Other Ambulatory Visit (HOSPITAL_COMMUNITY)

## 2023-03-26 DIAGNOSIS — R918 Other nonspecific abnormal finding of lung field: Secondary | ICD-10-CM

## 2023-03-26 DIAGNOSIS — J690 Pneumonitis due to inhalation of food and vomit: Secondary | ICD-10-CM | POA: Diagnosis not present

## 2023-03-26 DIAGNOSIS — S0990XA Unspecified injury of head, initial encounter: Secondary | ICD-10-CM | POA: Diagnosis not present

## 2023-03-26 DIAGNOSIS — J9601 Acute respiratory failure with hypoxia: Secondary | ICD-10-CM

## 2023-03-26 DIAGNOSIS — L8990 Pressure ulcer of unspecified site, unspecified stage: Secondary | ICD-10-CM | POA: Insufficient documentation

## 2023-03-26 DIAGNOSIS — N390 Urinary tract infection, site not specified: Secondary | ICD-10-CM | POA: Insufficient documentation

## 2023-03-26 DIAGNOSIS — R012 Other cardiac sounds: Secondary | ICD-10-CM | POA: Diagnosis not present

## 2023-03-26 LAB — ECHOCARDIOGRAM COMPLETE
AR max vel: 1.91 cm2
AV Area VTI: 1.91 cm2
AV Area mean vel: 1.9 cm2
AV Mean grad: 8 mmHg
AV Peak grad: 14.1 mmHg
Ao pk vel: 1.88 m/s
Area-P 1/2: 2.73 cm2
Height: 75 in
S' Lateral: 3.3 cm
Weight: 2846.58 [oz_av]

## 2023-03-26 LAB — GLUCOSE, CAPILLARY
Glucose-Capillary: 83 mg/dL (ref 70–99)
Glucose-Capillary: 104 mg/dL — ABNORMAL HIGH (ref 70–99)
Glucose-Capillary: 111 mg/dL — ABNORMAL HIGH (ref 70–99)
Glucose-Capillary: 124 mg/dL — ABNORMAL HIGH (ref 70–99)
Glucose-Capillary: 90 mg/dL (ref 70–99)
Glucose-Capillary: 146 mg/dL — ABNORMAL HIGH (ref 70–99)

## 2023-03-26 LAB — CBC
HCT: 31.6 % — ABNORMAL LOW (ref 39.0–52.0)
Hemoglobin: 10.5 g/dL — ABNORMAL LOW (ref 13.0–17.0)
MCH: 30.5 pg (ref 26.0–34.0)
MCHC: 33.2 g/dL (ref 30.0–36.0)
MCV: 91.9 fL (ref 80.0–100.0)
Platelets: 122 10*3/uL — ABNORMAL LOW (ref 150–400)
RBC: 3.44 MIL/uL — ABNORMAL LOW (ref 4.22–5.81)
RDW: 14.6 % (ref 11.5–15.5)
WBC: 8.6 10*3/uL (ref 4.0–10.5)
nRBC: 0 % (ref 0.0–0.2)

## 2023-03-26 MED ORDER — HYDRALAZINE HCL 20 MG/ML IJ SOLN
10.0000 mg | Freq: Four times a day (QID) | INTRAMUSCULAR | Status: DC | PRN
Start: 2023-03-26 — End: 2023-03-30

## 2023-03-26 MED ORDER — POLYETHYLENE GLYCOL 3350 17 G PO PACK
17.0000 g | PACK | Freq: Two times a day (BID) | ORAL | Status: AC
  Administered 2023-03-27 (×2): 17 g via ORAL
  Filled 2023-03-26 (×3): qty 1

## 2023-03-26 MED ORDER — ALBUTEROL SULFATE (2.5 MG/3ML) 0.083% IN NEBU
2.5000 mg | INHALATION_SOLUTION | RESPIRATORY_TRACT | Status: AC
  Administered 2023-03-26 (×4): 2.5 mg via RESPIRATORY_TRACT
  Filled 2023-03-26 (×4): qty 3

## 2023-03-26 NOTE — Progress Notes (Addendum)
 Subjective: Just finished working with therapies and is up in chair. HR in 120s sinus rhythm on monitor. His pain is about 6/10 and he feels its overall controlled. Had hypotension overnight but off levo. He has ongoing blurry vision in right eye. I&O cathing without problems. Tolerating dys diet without n/v/abdominal pain. No BM for several days - normally has one every day to every other day. On 5 lpm O2 but denies significant SHOB. He has wet cough but it is not productive. 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 which has caused him to be unsteady on his feet for some time  Lives alone but his stepdaughter lives near by and his grandchild can help assist him on dc   Objective: Vital signs in last 24 hours: Temp:  [97.9 F (36.6 C)-98.9 F (37.2 C)] 97.9 F (36.6 C) (03/13 0308) Pulse Rate:  [60-113] 76 (03/13 0615) Resp:  [5-32] 5 (03/13 0615) BP: (61-171)/(37-99) 130/72 (03/13 0615) SpO2:  [87 %-100 %] 94 % (03/13 0615) Last BM Date :  (PTA)  Intake/Output from previous day: 03/12 0701 - 03/13 0700 In: 832.6 [I.V.:527; IV Piggyback:305.6] Out: 1925 [Urine:1925] Intake/Output this shift: No intake/output data recorded.  PE: Gen: NAD, laying in bed. HEENT: significant right-sided facial edema, periorbital ecchymosis and subconjunctival hemorrhage.  EOMI bilaterally. Unable to complete close right eye.   Heart: tachycardia in 120s. Sinus on monitor Lungs: CTAB, O2 5lpm [place].   Abd: soft, NT, ND Ext: appropriately moves all 4 extremities.  No edema Neuro: grossly intact Psych: A&Ox3  Lab Results:  Recent Labs    03/24/23 2342 03/25/23 0334  WBC 12.7* 11.5*  HGB 9.5* 9.1*  HCT 32.4* 27.6*  PLT 119* 119*   BMET Recent Labs    03/24/23 1339 03/24/23 2342 03/25/23 0417  NA 139  --  139  K 4.0  --  4.1  CL 97*  --  107  CO2 30  --  26  GLUCOSE 127*  --  110*  BUN 29*  --  28*  CREATININE 1.22 1.09 1.01  CALCIUM 9.6  --  7.9*    PT/INR Recent Labs    03/24/23 2030  LABPROT 14.5  INR 1.1   CMP     Component Value Date/Time   NA 139 03/25/2023 0417   NA 142 12/16/2013 1305   K 4.1 03/25/2023 0417   K 4.7 12/16/2013 1305   CL 107 03/25/2023 0417   CO2 26 03/25/2023 0417   CO2 32 (H) 12/16/2013 1305   GLUCOSE 110 (H) 03/25/2023 0417   GLUCOSE 86 12/16/2013 1305   BUN 28 (H) 03/25/2023 0417   BUN 16.6 12/16/2013 1305   CREATININE 1.01 03/25/2023 0417   CREATININE 0.8 12/16/2013 1305   CALCIUM 7.9 (L) 03/25/2023 0417   CALCIUM 9.9 12/16/2013 1305   PROT 8.0 03/24/2023 1339   PROT 8.0 12/16/2013 1305   ALBUMIN 4.3 03/24/2023 1339   ALBUMIN 4.0 12/16/2013 1305   AST 21 03/24/2023 1339   AST 20 12/16/2013 1305   ALT 15 03/24/2023 1339   ALT 13 12/16/2013 1305   ALKPHOS 89 03/24/2023 1339   ALKPHOS 84 12/16/2013 1305   BILITOT 0.9 03/24/2023 1339   BILITOT 0.45 12/16/2013 1305   GFRNONAA >60 03/25/2023 0417   GFRAA >60 01/19/2018 1010   Lipase  No results found for: "LIPASE"     Studies/Results: CT CHEST ABDOMEN PELVIS W CONTRAST Result Date: 03/25/2023 CLINICAL DATA:  Trauma.  Status post fall EXAM: CT CHEST, ABDOMEN, AND PELVIS WITH CONTRAST TECHNIQUE: Multidetector CT imaging of the chest, abdomen and pelvis was performed following the standard protocol during bolus administration of intravenous contrast. RADIATION DOSE REDUCTION: This exam was performed according to the departmental dose-optimization program which includes automated exposure control, adjustment of the mA and/or kV according to patient size and/or use of iterative reconstruction technique. CONTRAST:  75mL OMNIPAQUE IOHEXOL 350 MG/ML SOLN COMPARISON:  CT chest 03/18/2023. CT chest, abdomen and pelvis 12/08/2017 FINDINGS: CT CHEST FINDINGS Cardiovascular: Heart size is normal. No pericardial effusion. The thoracic aorta appears intact. Aortic atherosclerosis and coronary artery calcifications. Mediastinum/Nodes: 2.9 cm  heterogeneous nodule in left lobe of thyroid gland. This has been evaluated on previous imaging. (ref: J Am Coll Radiol. 2015 Feb;12(2): 143-50).The trachea appears patent and midline. Esophagus is unremarkable. No enlarged mediastinal or hilar lymph nodes. Lungs/Pleura: Opacification of the bronchus intermedius and right lower lobe airways identified compatible with aspiration and/or mucoid impaction. There is new airspace consolidation within the posterior right lower lobe is compatible with either aspiration and/or pneumonia. Partial atelectasis of the right lower lobe is also present. Mild subsegmental atelectasis within the posterior left lower lobe. Bilateral apical pleuroparenchymal scarring appears similar to previous exam. No pneumothorax. No pleural effusion. Musculoskeletal: Healed left anterior rib fractures. No acute or suspicious osseous findings. CT ABDOMEN PELVIS FINDINGS Hepatobiliary: No signs of hepatic injury or perihepatic hematoma. Sludge and multiple stones identified within the gallbladder. Gallstones measure up to 7 mm. No gallbladder wall thickening or inflammation. No bile duct dilatation. Pancreas: Unremarkable. No pancreatic ductal dilatation or surrounding inflammatory changes. Spleen: Normal in size without focal abnormality. Adrenals/Urinary Tract: Adrenal glands are unremarkable. Kidneys are normal, without renal calculi, focal lesion, or hydronephrosis. The bladder is decompressed containing 5 calcified stones which measure up to 2 cm. Stomach/Bowel: Stomach is within normal limits. Appendix appears normal. No evidence of bowel wall thickening, distention, or inflammatory changes. Vascular/Lymphatic: Aortic atherosclerosis. No aneurysm. The upper abdominal vascularity appears patent. Gastrohepatic ligament node measures 1.1 cm, image 69/3. No additional enlarged abdominopelvic lymph nodes. Reproductive: Prostate gland enlargement. Other: No free fluid or fluid collections. No signs  of pneumoperitoneum. Musculoskeletal: Right hip arthroplasty. No acute or suspicious osseous findings. Lumbar degenerative disc disease. There is a nondisplaced periprosthetic fracture involving the intertrochanteric portions of the proximal right femur. No additional fractures identified. IMPRESSION: 1. Nondisplaced periprosthetic fracture involving the intertrochanteric portions of the proximal right femur. 2. Opacification of the bronchus intermedius and right lower lobe airways compatible with aspiration. 3. New airspace consolidation within the posterior right lower lobe compatible with aspiration and/or pneumonia. 4. Cholelithiasis. 5. Bladder stones. 6. Prostate gland enlargement. 7.  Aortic Atherosclerosis (ICD10-I70.0). Electronically Signed   By: Signa Kell M.D.   On: 03/25/2023 05:38   DG Pelvis 1-2 Views Result Date: 03/24/2023 CLINICAL DATA:  Trauma, fall, right hip pain. EXAM: PELVIS - 1-2 VIEW COMPARISON:  None Available. FINDINGS: Pelvis: Right hip arthroplasty. Minimal cortical irregularity about the lateral proximal femoral cortex just below the greater trochanter may represent a nondisplaced fracture. No other pelvic fracture. Pubic rami are intact. Pubic symphysis and sacroiliac joints are congruent. Multiple lamellated calcifications in the midline may represent large bladder stones. Right femur: Cortical irregularity about the lateral femur demonstrated on the lateral view. No other fracture. Distal femur is intact. Knee alignment is maintained. No knee joint effusion. No focal soft tissue abnormalities. IMPRESSION: 1. Right hip arthroplasty. Minimal cortical irregularity about  the lateral proximal femoral cortex may represent a nondisplaced fracture. 2. Remainder the femurs intact. 3. Multiple lamellated calcifications in the pelvis midline may represent large bladder stones. Electronically Signed   By: Narda Rutherford M.D.   On: 03/24/2023 23:54   CT Head Wo Contrast Addendum Date:  03/24/2023 ADDENDUM REPORT: 03/24/2023 16:18 ADDENDUM: Acute findings discussed with Dr. Estell Harpin by telephone at approximately 3:15 p.m. on 03/24/2023. Electronically Signed   By: Jackey Loge D.O.   On: 03/24/2023 16:18   Result Date: 03/24/2023 CLINICAL DATA:  Provided history: Head trauma, moderate/severe. Facial trauma, blunt. Neck trauma, intoxicated or obtunded. Additional interbody: Fall (striking face), right eye swelling, right eye laceration. EXAM: CT HEAD WITHOUT CONTRAST CT MAXILLOFACIAL WITHOUT CONTRAST CT CERVICAL SPINE WITHOUT CONTRAST TECHNIQUE: Multidetector CT imaging of the head, cervical spine, and maxillofacial structures were performed using the standard protocol without intravenous contrast. Multiplanar CT image reconstructions of the cervical spine and maxillofacial structures were also generated. RADIATION DOSE REDUCTION: This exam was performed according to the departmental dose-optimization program which includes automated exposure control, adjustment of the mA and/or kV according to patient size and/or use of iterative reconstruction technique. COMPARISON:  Head CT 03/18/2023. Cervical spine CT 03/18/2023. Report from thyroid ultrasound 03/22/2013. FINDINGS: CT HEAD FINDINGS Brain: Generalized cerebral atrophy. Patchy and ill-defined hypoattenuation within the cerebral white matter, nonspecific but compatible with mild-to-moderate chronic small vessel ischemic disease. There is no acute intracranial hemorrhage. No demarcated cortical infarct. No extra-axial fluid collection. No evidence of an intracranial mass. No midline shift. Vascular: No hyperdense vessel.  Atherosclerotic calcifications. Skull: Acute, comminuted and displaced fracture of the lateral wall of the right bony orbit, extending into the adjacent right sphenoid bone. Adjacent minimally displaced and nondisplaced fractures within the right orbital roof/right frontal calvarium (for instance as seen on maxillofacial CT, image  25) (maxillofacial CT series 4, images 19-21). Additional nondisplaced acute fracture of the right frontal calvarium immediately above the superior orbital rim (for instance as seen on maxillofacial CT series 5, image 11). Other: Right frontal, parietal and temporal scalp hematoma. CT MAXILLOFACIAL FINDINGS Osseous: Acute comminuted and displaced fracture of the lateral wall of the right bony orbit. A mildly displaced fracture extends from the site into the adjacent right sphenoid bone. Additionally, there are adjacent minimally displaced and nondisplaced fractures within the right orbital roof/right frontal calvarium. Acute comminuted and displaced fracture of the right orbital floor (with depression of fracture fragments by up to 6 mm). Suspected acute, nondisplaced fracture of the right lamina papyracea (for instance as seen on series 9, image 31). Acute comminuted and displaced fractures of the anterior and posterolateral walls of the right maxillary sinus. Acute fractures also extend into the floor of the right maxillary sinus/maxillary alveolar process. No definite acute fracture of the medial wall of the right maxillary sinus is identified. Acute mildly displaced fracture of the right zygomatic arch. Orbits: Acute right orbital fractures as described above. Extensive gas within the right orbit and right periorbital region. Extraconal and intraconal/retrobulbar hemorrhage within the right orbit. Hemorrhage surrounds the right lateral and inferior rectus muscles. Right proptosis. Sinuses: Extensive hyperdense opacification of the right maxillary sinus consistent with the presence of hemorrhage. Mild nonspecific partial opacification of right ethmoid air cells. Soft tissues: Right periorbital and facial hematoma. Extensive soft tissue gas in the right periorbital region and right face. Other: Nonspecific partial opacification of the right nasal passage. CT CERVICAL SPINE FINDINGS Alignment: Dextrocurvature of  the cervical spine. No significant spondylolisthesis. Skull  base and vertebrae: The basion-dental and atlanto-dental intervals are maintained.No evidence of acute fracture to the cervical spine. C6-C7 vertebral body ankylosis. Soft tissues and spinal canal: No prevertebral fluid or swelling. No visible canal hematoma. Incompletely imaged left thyroid lobe nodule. Per radiology records, this nodule was previously biopsied on 03/22/2013. Correlate with the pathology report from this prior procedure. Disc levels: Cervical spondylosis with multilevel disc space narrowing, disc bulges/disc protrusions, posterior disc osteophyte complexes, uncovertebral hypertrophy and facet arthropathy. Fusion across the C6-C7 disc space. At the non-fused levels, disc space narrowing is greatest at C5-C6 (advanced at this level). No appreciable high-grade spinal canal stenosis. Multilevel bony neural from narrowing. Degenerative changes also present at the C1-C2 articulation. Upper chest: No acute finding. Incompletely imaged, but known, left upper lobe pulmonary nodule. Attempts are being made to reach the ordering provider at this time. IMPRESSION: CT head: 1.  No evidence of an acute intracranial abnormality. 2. Acute, nondisplaced fracture of the right frontal calvarium (immediately above the right superior orbital rim). Acute orbital/facial fractures with fractures extending into the calvarium as described in the maxillofacial findings/impression section. 3. Right frontal, parietal and temporal scalp hematoma. 4. Parenchymal atrophy and chronic small vessel ischemic disease. CT maxillofacial: 1. Acute comminuted and displaced fracture of the lateral wall of the right bony orbit. A mildly displaced fracture extends from the site into the adjacent right sphenoid bone. 2. Adjacent minimally displaced and nondisplaced fractures within the right orbital roof/right frontal calvarium. 3. Acute comminuted and displaced fracture of the right  orbital floor, extending into the inferior orbital rim (with depression of fracture fragments by up to 6 mm). 4. Suspected acute, nondisplaced fracture of the right lamina papyracea , 5. Extensive acute comminuted and displaced fractures of the anterior and posterolateral walls of the right maxillary sinus. Acute fractures also extend into the floor of the right maxillary sinus/maxillary alveolar process. 6. Acute mildly displaced fracture of the right zygomatic arch. 7. Extraconal and intraconal/retrobulbar hemorrhage within the right orbit. Hemorrhage surrounds the right lateral and inferior rectus muscles. Correlate with physical exam findings to exclude extraocular muscle entrapment. 8. Extensive gas within the right orbit. 9. Right proptosis. 10. Right periorbital/facial hematoma and subcutaneous gas. CT cervical spine: 1. No evidence of acute cervical spine fracture. 2. Dextrocurvature of the cervical spine. 3. C6-C7 vertebral ankylosis. 4. Cervical spondylosis as described. Electronically Signed: By: Jackey Loge D.O. On: 03/24/2023 15:13   CT Cervical Spine Wo Contrast Addendum Date: 03/24/2023 ADDENDUM REPORT: 03/24/2023 16:18 ADDENDUM: Acute findings discussed with Dr. Estell Harpin by telephone at approximately 3:15 p.m. on 03/24/2023. Electronically Signed   By: Jackey Loge D.O.   On: 03/24/2023 16:18   Result Date: 03/24/2023 CLINICAL DATA:  Provided history: Head trauma, moderate/severe. Facial trauma, blunt. Neck trauma, intoxicated or obtunded. Additional interbody: Fall (striking face), right eye swelling, right eye laceration. EXAM: CT HEAD WITHOUT CONTRAST CT MAXILLOFACIAL WITHOUT CONTRAST CT CERVICAL SPINE WITHOUT CONTRAST TECHNIQUE: Multidetector CT imaging of the head, cervical spine, and maxillofacial structures were performed using the standard protocol without intravenous contrast. Multiplanar CT image reconstructions of the cervical spine and maxillofacial structures were also generated.  RADIATION DOSE REDUCTION: This exam was performed according to the departmental dose-optimization program which includes automated exposure control, adjustment of the mA and/or kV according to patient size and/or use of iterative reconstruction technique. COMPARISON:  Head CT 03/18/2023. Cervical spine CT 03/18/2023. Report from thyroid ultrasound 03/22/2013. FINDINGS: CT HEAD FINDINGS Brain: Generalized cerebral atrophy. Patchy and ill-defined  hypoattenuation within the cerebral white matter, nonspecific but compatible with mild-to-moderate chronic small vessel ischemic disease. There is no acute intracranial hemorrhage. No demarcated cortical infarct. No extra-axial fluid collection. No evidence of an intracranial mass. No midline shift. Vascular: No hyperdense vessel.  Atherosclerotic calcifications. Skull: Acute, comminuted and displaced fracture of the lateral wall of the right bony orbit, extending into the adjacent right sphenoid bone. Adjacent minimally displaced and nondisplaced fractures within the right orbital roof/right frontal calvarium (for instance as seen on maxillofacial CT, image 25) (maxillofacial CT series 4, images 19-21). Additional nondisplaced acute fracture of the right frontal calvarium immediately above the superior orbital rim (for instance as seen on maxillofacial CT series 5, image 11). Other: Right frontal, parietal and temporal scalp hematoma. CT MAXILLOFACIAL FINDINGS Osseous: Acute comminuted and displaced fracture of the lateral wall of the right bony orbit. A mildly displaced fracture extends from the site into the adjacent right sphenoid bone. Additionally, there are adjacent minimally displaced and nondisplaced fractures within the right orbital roof/right frontal calvarium. Acute comminuted and displaced fracture of the right orbital floor (with depression of fracture fragments by up to 6 mm). Suspected acute, nondisplaced fracture of the right lamina papyracea (for instance  as seen on series 9, image 31). Acute comminuted and displaced fractures of the anterior and posterolateral walls of the right maxillary sinus. Acute fractures also extend into the floor of the right maxillary sinus/maxillary alveolar process. No definite acute fracture of the medial wall of the right maxillary sinus is identified. Acute mildly displaced fracture of the right zygomatic arch. Orbits: Acute right orbital fractures as described above. Extensive gas within the right orbit and right periorbital region. Extraconal and intraconal/retrobulbar hemorrhage within the right orbit. Hemorrhage surrounds the right lateral and inferior rectus muscles. Right proptosis. Sinuses: Extensive hyperdense opacification of the right maxillary sinus consistent with the presence of hemorrhage. Mild nonspecific partial opacification of right ethmoid air cells. Soft tissues: Right periorbital and facial hematoma. Extensive soft tissue gas in the right periorbital region and right face. Other: Nonspecific partial opacification of the right nasal passage. CT CERVICAL SPINE FINDINGS Alignment: Dextrocurvature of the cervical spine. No significant spondylolisthesis. Skull base and vertebrae: The basion-dental and atlanto-dental intervals are maintained.No evidence of acute fracture to the cervical spine. C6-C7 vertebral body ankylosis. Soft tissues and spinal canal: No prevertebral fluid or swelling. No visible canal hematoma. Incompletely imaged left thyroid lobe nodule. Per radiology records, this nodule was previously biopsied on 03/22/2013. Correlate with the pathology report from this prior procedure. Disc levels: Cervical spondylosis with multilevel disc space narrowing, disc bulges/disc protrusions, posterior disc osteophyte complexes, uncovertebral hypertrophy and facet arthropathy. Fusion across the C6-C7 disc space. At the non-fused levels, disc space narrowing is greatest at C5-C6 (advanced at this level). No appreciable  high-grade spinal canal stenosis. Multilevel bony neural from narrowing. Degenerative changes also present at the C1-C2 articulation. Upper chest: No acute finding. Incompletely imaged, but known, left upper lobe pulmonary nodule. Attempts are being made to reach the ordering provider at this time. IMPRESSION: CT head: 1.  No evidence of an acute intracranial abnormality. 2. Acute, nondisplaced fracture of the right frontal calvarium (immediately above the right superior orbital rim). Acute orbital/facial fractures with fractures extending into the calvarium as described in the maxillofacial findings/impression section. 3. Right frontal, parietal and temporal scalp hematoma. 4. Parenchymal atrophy and chronic small vessel ischemic disease. CT maxillofacial: 1. Acute comminuted and displaced fracture of the lateral wall of the right bony  orbit. A mildly displaced fracture extends from the site into the adjacent right sphenoid bone. 2. Adjacent minimally displaced and nondisplaced fractures within the right orbital roof/right frontal calvarium. 3. Acute comminuted and displaced fracture of the right orbital floor, extending into the inferior orbital rim (with depression of fracture fragments by up to 6 mm). 4. Suspected acute, nondisplaced fracture of the right lamina papyracea , 5. Extensive acute comminuted and displaced fractures of the anterior and posterolateral walls of the right maxillary sinus. Acute fractures also extend into the floor of the right maxillary sinus/maxillary alveolar process. 6. Acute mildly displaced fracture of the right zygomatic arch. 7. Extraconal and intraconal/retrobulbar hemorrhage within the right orbit. Hemorrhage surrounds the right lateral and inferior rectus muscles. Correlate with physical exam findings to exclude extraocular muscle entrapment. 8. Extensive gas within the right orbit. 9. Right proptosis. 10. Right periorbital/facial hematoma and subcutaneous gas. CT cervical  spine: 1. No evidence of acute cervical spine fracture. 2. Dextrocurvature of the cervical spine. 3. C6-C7 vertebral ankylosis. 4. Cervical spondylosis as described. Electronically Signed: By: Jackey Loge D.O. On: 03/24/2023 15:13   CT Maxillofacial Wo Contrast Addendum Date: 03/24/2023 ADDENDUM REPORT: 03/24/2023 16:18 ADDENDUM: Acute findings discussed with Dr. Estell Harpin by telephone at approximately 3:15 p.m. on 03/24/2023. Electronically Signed   By: Jackey Loge D.O.   On: 03/24/2023 16:18   Result Date: 03/24/2023 CLINICAL DATA:  Provided history: Head trauma, moderate/severe. Facial trauma, blunt. Neck trauma, intoxicated or obtunded. Additional interbody: Fall (striking face), right eye swelling, right eye laceration. EXAM: CT HEAD WITHOUT CONTRAST CT MAXILLOFACIAL WITHOUT CONTRAST CT CERVICAL SPINE WITHOUT CONTRAST TECHNIQUE: Multidetector CT imaging of the head, cervical spine, and maxillofacial structures were performed using the standard protocol without intravenous contrast. Multiplanar CT image reconstructions of the cervical spine and maxillofacial structures were also generated. RADIATION DOSE REDUCTION: This exam was performed according to the departmental dose-optimization program which includes automated exposure control, adjustment of the mA and/or kV according to patient size and/or use of iterative reconstruction technique. COMPARISON:  Head CT 03/18/2023. Cervical spine CT 03/18/2023. Report from thyroid ultrasound 03/22/2013. FINDINGS: CT HEAD FINDINGS Brain: Generalized cerebral atrophy. Patchy and ill-defined hypoattenuation within the cerebral white matter, nonspecific but compatible with mild-to-moderate chronic small vessel ischemic disease. There is no acute intracranial hemorrhage. No demarcated cortical infarct. No extra-axial fluid collection. No evidence of an intracranial mass. No midline shift. Vascular: No hyperdense vessel.  Atherosclerotic calcifications. Skull: Acute,  comminuted and displaced fracture of the lateral wall of the right bony orbit, extending into the adjacent right sphenoid bone. Adjacent minimally displaced and nondisplaced fractures within the right orbital roof/right frontal calvarium (for instance as seen on maxillofacial CT, image 25) (maxillofacial CT series 4, images 19-21). Additional nondisplaced acute fracture of the right frontal calvarium immediately above the superior orbital rim (for instance as seen on maxillofacial CT series 5, image 11). Other: Right frontal, parietal and temporal scalp hematoma. CT MAXILLOFACIAL FINDINGS Osseous: Acute comminuted and displaced fracture of the lateral wall of the right bony orbit. A mildly displaced fracture extends from the site into the adjacent right sphenoid bone. Additionally, there are adjacent minimally displaced and nondisplaced fractures within the right orbital roof/right frontal calvarium. Acute comminuted and displaced fracture of the right orbital floor (with depression of fracture fragments by up to 6 mm). Suspected acute, nondisplaced fracture of the right lamina papyracea (for instance as seen on series 9, image 31). Acute comminuted and displaced fractures of the anterior and posterolateral  walls of the right maxillary sinus. Acute fractures also extend into the floor of the right maxillary sinus/maxillary alveolar process. No definite acute fracture of the medial wall of the right maxillary sinus is identified. Acute mildly displaced fracture of the right zygomatic arch. Orbits: Acute right orbital fractures as described above. Extensive gas within the right orbit and right periorbital region. Extraconal and intraconal/retrobulbar hemorrhage within the right orbit. Hemorrhage surrounds the right lateral and inferior rectus muscles. Right proptosis. Sinuses: Extensive hyperdense opacification of the right maxillary sinus consistent with the presence of hemorrhage. Mild nonspecific partial  opacification of right ethmoid air cells. Soft tissues: Right periorbital and facial hematoma. Extensive soft tissue gas in the right periorbital region and right face. Other: Nonspecific partial opacification of the right nasal passage. CT CERVICAL SPINE FINDINGS Alignment: Dextrocurvature of the cervical spine. No significant spondylolisthesis. Skull base and vertebrae: The basion-dental and atlanto-dental intervals are maintained.No evidence of acute fracture to the cervical spine. C6-C7 vertebral body ankylosis. Soft tissues and spinal canal: No prevertebral fluid or swelling. No visible canal hematoma. Incompletely imaged left thyroid lobe nodule. Per radiology records, this nodule was previously biopsied on 03/22/2013. Correlate with the pathology report from this prior procedure. Disc levels: Cervical spondylosis with multilevel disc space narrowing, disc bulges/disc protrusions, posterior disc osteophyte complexes, uncovertebral hypertrophy and facet arthropathy. Fusion across the C6-C7 disc space. At the non-fused levels, disc space narrowing is greatest at C5-C6 (advanced at this level). No appreciable high-grade spinal canal stenosis. Multilevel bony neural from narrowing. Degenerative changes also present at the C1-C2 articulation. Upper chest: No acute finding. Incompletely imaged, but known, left upper lobe pulmonary nodule. Attempts are being made to reach the ordering provider at this time. IMPRESSION: CT head: 1.  No evidence of an acute intracranial abnormality. 2. Acute, nondisplaced fracture of the right frontal calvarium (immediately above the right superior orbital rim). Acute orbital/facial fractures with fractures extending into the calvarium as described in the maxillofacial findings/impression section. 3. Right frontal, parietal and temporal scalp hematoma. 4. Parenchymal atrophy and chronic small vessel ischemic disease. CT maxillofacial: 1. Acute comminuted and displaced fracture of the  lateral wall of the right bony orbit. A mildly displaced fracture extends from the site into the adjacent right sphenoid bone. 2. Adjacent minimally displaced and nondisplaced fractures within the right orbital roof/right frontal calvarium. 3. Acute comminuted and displaced fracture of the right orbital floor, extending into the inferior orbital rim (with depression of fracture fragments by up to 6 mm). 4. Suspected acute, nondisplaced fracture of the right lamina papyracea , 5. Extensive acute comminuted and displaced fractures of the anterior and posterolateral walls of the right maxillary sinus. Acute fractures also extend into the floor of the right maxillary sinus/maxillary alveolar process. 6. Acute mildly displaced fracture of the right zygomatic arch. 7. Extraconal and intraconal/retrobulbar hemorrhage within the right orbit. Hemorrhage surrounds the right lateral and inferior rectus muscles. Correlate with physical exam findings to exclude extraocular muscle entrapment. 8. Extensive gas within the right orbit. 9. Right proptosis. 10. Right periorbital/facial hematoma and subcutaneous gas. CT cervical spine: 1. No evidence of acute cervical spine fracture. 2. Dextrocurvature of the cervical spine. 3. C6-C7 vertebral ankylosis. 4. Cervical spondylosis as described. Electronically Signed: By: Jackey Loge D.O. On: 03/24/2023 15:13   DG Chest Port 1 View Result Date: 03/24/2023 CLINICAL DATA:  Shortness of breath EXAM: PORTABLE CHEST 1 VIEW COMPARISON:  07/13/2021 FINDINGS: Right-sided central venous port tip over the SVC. Hyperinflation. No acute  airspace disease, pleural effusion or pneumothorax. Normal cardiomediastinal contour. IMPRESSION: No active disease. Hyperinflation. Electronically Signed   By: Jasmine Pang M.D.   On: 03/24/2023 15:51    Anti-infectives: Anti-infectives (From admission, onward)    Start     Dose/Rate Route Frequency Ordered Stop   03/25/23 2200  cefTRIAXone (ROCEPHIN) 2 g  in sodium chloride 0.9 % 100 mL IVPB  Status:  Discontinued        2 g 200 mL/hr over 30 Minutes Intravenous Every 24 hours 03/25/23 0513 03/25/23 0650   03/25/23 2000  vancomycin (VANCOCIN) IVPB 1000 mg/200 mL premix        1,000 mg 200 mL/hr over 60 Minutes Intravenous Every 12 hours 03/25/23 0726     03/25/23 0730  piperacillin-tazobactam (ZOSYN) IVPB 3.375 g        3.375 g 12.5 mL/hr over 240 Minutes Intravenous Every 8 hours 03/25/23 0718     03/25/23 0730  vancomycin (VANCOREADY) IVPB 1750 mg/350 mL        1,750 mg 175 mL/hr over 120 Minutes Intravenous  Once 03/25/23 0719 03/25/23 1216   03/25/23 0715  vancomycin (VANCOCIN) IVPB 1000 mg/200 mL premix  Status:  Discontinued        1,000 mg 200 mL/hr over 60 Minutes Intravenous Every 12 hours 03/25/23 0712 03/25/23 0717   03/25/23 0715  piperacillin-tazobactam (ZOSYN) IVPB 3.375 g  Status:  Discontinued        3.375 g 100 mL/hr over 30 Minutes Intravenous Every 8 hours 03/25/23 0712 03/25/23 0718   03/25/23 0000  doxycycline (VIBRA-TABS) tablet 100 mg  Status:  Discontinued        100 mg Oral 2 times daily 03/24/23 2346 03/25/23 0835   03/24/23 2030  cefTRIAXone (ROCEPHIN) 2 g in sodium chloride 0.9 % 100 mL IVPB        2 g 200 mL/hr over 30 Minutes Intravenous Once 03/24/23 2016 03/24/23 2131        Assessment/Plan GLF R frontal calvarium fx - pain control. No underlying ICH Facial fxs - ENT Dr. Jearld Fenton has seen - he may need repair but monitor approx 1 week prior to further discussion.   Dr. Dione Booze with ophthalmology has seen the patient - erythromycin ointment for eye and small lac TID per his reccs and right now does not need lateral canthotomy.  Will need close monitoring and follow up in 1-2 weeks outpatient. R hip periprosthetic fx - ortho consulted. Recc WBAT with RW. No active hip abduction. F/u [facility] 2-3 wks. therapies Hypotension - unclear etiology.  S/p 1 l bolus 3/12. Good UOP. Weaned off of peripheral very low  dose levo but did have transient hypotension overnight. He has otherwise been normo or hypertensive. Remains off this am.  ? Related to UTI vs RLL consolidation or something else.  Medicine consulted.  Appreciate their assistance. Empiric vanc/zosyn given unknown etiology at this time UTI - UA appears dirty, urine cx pending RLL consolidation/? Aspiration vs PNA - medicine consulted, overall CTA.  Sats are low though at times.  Continue O2 (not on o2 at baseline).  Abx therapy.  respiratory culture pending. ?DM - hgba1c 5.3 here.  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 He has not had tx in 10 years and doing well. Unsteady gait - frequent falls at home, uses a cane.  Lives by himself Unclear medication usage - doxy an prednisone 40mg  daily on  home med  recc initially. Pharmacy has verified meds and home meds now reconciled.  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 Indwelling for now Anemia-  likely of chronic disease. Monitor. Am CBC pending FEN - dys 3 VTE - lovenox ID - vanc/zosyn Dispo - ICU pending need for levo again.  I reviewed Consultant ortho, ENT, opthalmology, TRH, CCM notes, last 24 h vitals and pain scores, last 48 h intake and output, last 24 h labs and trends, and last 24 h imaging results.   LOS: 2 days    Eric Form , [facility] 03/26/2023, 7:36 AM Please see Amion for pager number during day hours 7:00am-4:30pm or 7:00am -11:30am on weekends   Addendum: Appreciate Ophtho/Ortho/ENT. Up in chair. I&O cath now. Urine CX >100k GNR P ID. Blood CX neg so far. D/C Vanc. May be able to move to progressive this PM if BP stays up.  Violeta Gelinas, MD, MPH, FACS Please use [email or web address] to contact on call provider

## 2023-03-26 NOTE — Progress Notes (Signed)
  Echocardiogram 2D Echocardiogram has been performed.  Delcie Roch 03/26/2023, 3:50 PM

## 2023-03-26 NOTE — Progress Notes (Addendum)
 [facility] CONSULT PROGRESS NOTE    Progress Note  [name]  RUE:[id number] DOB: November 14, 1950 DOA: 03/24/2023 PCP: Assunta Found, MD     Brief Narrative:   [name] is an 73 y.o. male past medical history of diabetes mellitus type 2, tonsillar cancer, lung cancer status post chemo and radiation with a PEG tube 10 years ago, chronic urinary retention who self catheterize 3 times a day who was brought in to the ED on 03/24/2023 after a fall at a local bank he tripped and landed on his face laceration about the right eye with elevation.  Minor loss of consciousness, in the ED on admission he was relatively stable CT scan of the head,  CT of the maxillofacial, C-spine showed, multiple extensive facial and orbital fractures hip x-ray showed right hip arthroplasty with minimal cortical irregularity of the proximal femur cortex and a bladder stone.  CT of the chest abdomen pelvis confirmed a nondisplaced periprosthetic fracture involving the intertrochanteric portion of the proximal right femur, some aspiration pneumonia to the right lower lobe and a consolidation in the right posterior lobe.  Requires pressors temporarily now on the morning of 03/26/2023 he is hypertensive and has been weaned off pressors.  No evidence of syncope or prodromal symptoms twelve-lead EKG showed normal sinus rhythm, normal axis no ST segment abnormalities, no events on telemetry.  We were consulted for right lower lobe infiltrates and management of diabetes   Assessment/Plan:   Extensive facial fracture/right frontal calvarial fracture: Secondary to mechanical fall. Further management per trauma surgery ENT and ophthalmology. Manage he recommended no surgical intervention.  Did recommend erythromycin. ENT recommended possible surgical intervention but will need to settle for about a week and then discuss it. He relates no prodromal symptoms no loss of consciousness, he relates he thinks he tripped and  fell. Physical therapy and Occupational Therapy per Ortho and trauma team. Pain control per trauma team. Check a 2D echo, has a murmur on physical exam.  Right periprostatic fracture: Ortho was consulted and they relate implant appears stable, may bearing as tolerated with rolling walker. They recommended follow-up with them as an outpatient.  Acute respiratory failure with hypoxia possibly due to aspiration right lower lobe/pneumonia: He was started empirically on IV vancomycin and Zosyn by primary team. He has remained afebrile leukocytosis improved. He is currently on 2 L of oxygen which I think he could be weaned to room air. Incentive spirometry and will try again to room air. Antibiotics that you are using shoe cover well only due to 5 to 7-day course. Blood cultures have been sent.  Hypotension in the ED: He was briefly which required pressors, now has been weaned off pressors and is hypertensive.  Chronic urinary retention/possible UTI: Currently on IV Zosyn should cover.  Diabetes mellitus type 2: With a last A1c of 6.6.  PT on this admission 5.3. Will go ahead and discontinue all insulins for now. Continue CBGs.  History of metastatic tonsillar cancer/history of cancer: Reports treated with radiation and chemotherapy currently has a PEG tube.  Acute blood loss anemia : with baseline hemoglobin around 14, now around 1.9. Not tachycardic continue to monitor.  Thrombocytopenia: Likely reactive.  Fibromyalgia/depression/anxiety: Resume Celexa and Xanax unable to take orals.   DVT prophylaxis: SCD's Family Communication:none Status is: Inpatient Remains inpatient appropriate because: Acute trauma    Code Status:     Code Status Orders  (From admission, onward)  Start     Ordered   03/24/23 2343  Full code  Continuous       Question:  By:  Answer:  Other   03/24/23 2345           Code Status History     Date Active Date Inactive Code  Status Order ID Comments User Context   01/15/2018 1533 01/19/2018 2039 Full Code [id number]  Catarina Hartshorn, MD ED   01/25/2014 1401 01/26/2014 0410 Full Code [id number]  Oley Balm, MD HOV   04/04/2013 1625 04/05/2013 1732 Full Code [id number]  Axel Filler, MD Inpatient   10/18/2012 0157 10/19/2012 1920 Full Code [id number]  Vania Rea, MD Inpatient         IV Access:   Peripheral IV   Procedures and diagnostic studies:   CT CHEST ABDOMEN PELVIS W CONTRAST Result Date: 03/25/2023 CLINICAL DATA:  Trauma.  Status post fall EXAM: CT CHEST, ABDOMEN, AND PELVIS WITH CONTRAST TECHNIQUE: Multidetector CT imaging of the chest, abdomen and pelvis was performed following the standard protocol during bolus administration of intravenous contrast. RADIATION DOSE REDUCTION: This exam was performed according to the departmental dose-optimization program which includes automated exposure control, adjustment of the mA and/or kV according to patient size and/or use of iterative reconstruction technique. CONTRAST:  75mL OMNIPAQUE IOHEXOL 350 MG/ML SOLN COMPARISON:  CT chest 03/18/2023. CT chest, abdomen and pelvis 12/08/2017 FINDINGS: CT CHEST FINDINGS Cardiovascular: Heart size is normal. No pericardial effusion. The thoracic aorta appears intact. Aortic atherosclerosis and coronary artery calcifications. Mediastinum/Nodes: 2.9 cm heterogeneous nodule in left lobe of thyroid gland. This has been evaluated on previous imaging. (ref: J Am Coll Radiol. 2015 Feb;12(2): 143-50).The trachea appears patent and midline. Esophagus is unremarkable. No enlarged mediastinal or hilar lymph nodes. Lungs/Pleura: Opacification of the bronchus intermedius and right lower lobe airways identified compatible with aspiration and/or mucoid impaction. There is new airspace consolidation within the posterior right lower lobe is compatible with either aspiration and/or pneumonia. Partial atelectasis of the right lower lobe is also present.  Mild subsegmental atelectasis within the posterior left lower lobe. Bilateral apical pleuroparenchymal scarring appears similar to previous exam. No pneumothorax. No pleural effusion. Musculoskeletal: Healed left anterior rib fractures. No acute or suspicious osseous findings. CT ABDOMEN PELVIS FINDINGS Hepatobiliary: No signs of hepatic injury or perihepatic hematoma. Sludge and multiple stones identified within the gallbladder. Gallstones measure up to 7 mm. No gallbladder wall thickening or inflammation. No bile duct dilatation. Pancreas: Unremarkable. No pancreatic ductal dilatation or surrounding inflammatory changes. Spleen: Normal in size without focal abnormality. Adrenals/Urinary Tract: Adrenal glands are unremarkable. Kidneys are normal, without renal calculi, focal lesion, or hydronephrosis. The bladder is decompressed containing 5 calcified stones which measure up to 2 cm. Stomach/Bowel: Stomach is within normal limits. Appendix appears normal. No evidence of bowel wall thickening, distention, or inflammatory changes. Vascular/Lymphatic: Aortic atherosclerosis. No aneurysm. The upper abdominal vascularity appears patent. Gastrohepatic ligament node measures 1.1 cm, image 69/3. No additional enlarged abdominopelvic lymph nodes. Reproductive: Prostate gland enlargement. Other: No free fluid or fluid collections. No signs of pneumoperitoneum. Musculoskeletal: Right hip arthroplasty. No acute or suspicious osseous findings. Lumbar degenerative disc disease. There is a nondisplaced periprosthetic fracture involving the intertrochanteric portions of the proximal right femur. No additional fractures identified. IMPRESSION: 1. Nondisplaced periprosthetic fracture involving the intertrochanteric portions of the proximal right femur. 2. Opacification of the bronchus intermedius and right lower lobe airways compatible with aspiration. 3. New airspace consolidation within the posterior right lower lobe compatible  with aspiration and/or pneumonia. 4. Cholelithiasis. 5. Bladder stones. 6. Prostate gland enlargement. 7.  Aortic Atherosclerosis (ICD10-I70.0). Electronically Signed   By: Signa Kell M.D.   On: 03/25/2023 05:38   DG Pelvis 1-2 Views Result Date: 03/24/2023 CLINICAL DATA:  Trauma, fall, right hip pain. EXAM: PELVIS - 1-2 VIEW COMPARISON:  None Available. FINDINGS: Pelvis: Right hip arthroplasty. Minimal cortical irregularity about the lateral proximal femoral cortex just below the greater trochanter may represent a nondisplaced fracture. No other pelvic fracture. Pubic rami are intact. Pubic symphysis and sacroiliac joints are congruent. Multiple lamellated calcifications in the midline may represent large bladder stones. Right femur: Cortical irregularity about the lateral femur demonstrated on the lateral view. No other fracture. Distal femur is intact. Knee alignment is maintained. No knee joint effusion. No focal soft tissue abnormalities. IMPRESSION: 1. Right hip arthroplasty. Minimal cortical irregularity about the lateral proximal femoral cortex may represent a nondisplaced fracture. 2. Remainder the femurs intact. 3. Multiple lamellated calcifications in the pelvis midline may represent large bladder stones. Electronically Signed   By: Narda Rutherford M.D.   On: 03/24/2023 23:54   CT Head Wo Contrast Addendum Date: 03/24/2023 ADDENDUM REPORT: 03/24/2023 16:18 ADDENDUM: Acute findings discussed with Dr. Estell Harpin by telephone at approximately 3:15 p.m. on 03/24/2023. Electronically Signed   By: Jackey Loge D.O.   On: 03/24/2023 16:18   Result Date: 03/24/2023 CLINICAL DATA:  Provided history: Head trauma, moderate/severe. Facial trauma, blunt. Neck trauma, intoxicated or obtunded. Additional interbody: Fall (striking face), right eye swelling, right eye laceration. EXAM: CT HEAD WITHOUT CONTRAST CT MAXILLOFACIAL WITHOUT CONTRAST CT CERVICAL SPINE WITHOUT CONTRAST TECHNIQUE: Multidetector CT imaging  of the head, cervical spine, and maxillofacial structures were performed using the standard protocol without intravenous contrast. Multiplanar CT image reconstructions of the cervical spine and maxillofacial structures were also generated. RADIATION DOSE REDUCTION: This exam was performed according to the departmental dose-optimization program which includes automated exposure control, adjustment of the mA and/or kV according to patient size and/or use of iterative reconstruction technique. COMPARISON:  Head CT 03/18/2023. Cervical spine CT 03/18/2023. Report from thyroid ultrasound 03/22/2013. FINDINGS: CT HEAD FINDINGS Brain: Generalized cerebral atrophy. Patchy and ill-defined hypoattenuation within the cerebral white matter, nonspecific but compatible with mild-to-moderate chronic small vessel ischemic disease. There is no acute intracranial hemorrhage. No demarcated cortical infarct. No extra-axial fluid collection. No evidence of an intracranial mass. No midline shift. Vascular: No hyperdense vessel.  Atherosclerotic calcifications. Skull: Acute, comminuted and displaced fracture of the lateral wall of the right bony orbit, extending into the adjacent right sphenoid bone. Adjacent minimally displaced and nondisplaced fractures within the right orbital roof/right frontal calvarium (for instance as seen on maxillofacial CT, image 25) (maxillofacial CT series 4, images 19-21). Additional nondisplaced acute fracture of the right frontal calvarium immediately above the superior orbital rim (for instance as seen on maxillofacial CT series 5, image 11). Other: Right frontal, parietal and temporal scalp hematoma. CT MAXILLOFACIAL FINDINGS Osseous: Acute comminuted and displaced fracture of the lateral wall of the right bony orbit. A mildly displaced fracture extends from the site into the adjacent right sphenoid bone. Additionally, there are adjacent minimally displaced and nondisplaced fractures within the right  orbital roof/right frontal calvarium. Acute comminuted and displaced fracture of the right orbital floor (with depression of fracture fragments by up to 6 mm). Suspected acute, nondisplaced fracture of the right lamina papyracea (for instance as seen on series 9, image 31). Acute comminuted and displaced fractures of the anterior  and posterolateral walls of the right maxillary sinus. Acute fractures also extend into the floor of the right maxillary sinus/maxillary alveolar process. No definite acute fracture of the medial wall of the right maxillary sinus is identified. Acute mildly displaced fracture of the right zygomatic arch. Orbits: Acute right orbital fractures as described above. Extensive gas within the right orbit and right periorbital region. Extraconal and intraconal/retrobulbar hemorrhage within the right orbit. Hemorrhage surrounds the right lateral and inferior rectus muscles. Right proptosis. Sinuses: Extensive hyperdense opacification of the right maxillary sinus consistent with the presence of hemorrhage. Mild nonspecific partial opacification of right ethmoid air cells. Soft tissues: Right periorbital and facial hematoma. Extensive soft tissue gas in the right periorbital region and right face. Other: Nonspecific partial opacification of the right nasal passage. CT CERVICAL SPINE FINDINGS Alignment: Dextrocurvature of the cervical spine. No significant spondylolisthesis. Skull base and vertebrae: The basion-dental and atlanto-dental intervals are maintained.No evidence of acute fracture to the cervical spine. C6-C7 vertebral body ankylosis. Soft tissues and spinal canal: No prevertebral fluid or swelling. No visible canal hematoma. Incompletely imaged left thyroid lobe nodule. Per radiology records, this nodule was previously biopsied on 03/22/2013. Correlate with the pathology report from this prior procedure. Disc levels: Cervical spondylosis with multilevel disc space narrowing, disc bulges/disc  protrusions, posterior disc osteophyte complexes, uncovertebral hypertrophy and facet arthropathy. Fusion across the C6-C7 disc space. At the non-fused levels, disc space narrowing is greatest at C5-C6 (advanced at this level). No appreciable high-grade spinal canal stenosis. Multilevel bony neural from narrowing. Degenerative changes also present at the C1-C2 articulation. Upper chest: No acute finding. Incompletely imaged, but known, left upper lobe pulmonary nodule. Attempts are being made to reach the ordering provider at this time. IMPRESSION: CT head: 1.  No evidence of an acute intracranial abnormality. 2. Acute, nondisplaced fracture of the right frontal calvarium (immediately above the right superior orbital rim). Acute orbital/facial fractures with fractures extending into the calvarium as described in the maxillofacial findings/impression section. 3. Right frontal, parietal and temporal scalp hematoma. 4. Parenchymal atrophy and chronic small vessel ischemic disease. CT maxillofacial: 1. Acute comminuted and displaced fracture of the lateral wall of the right bony orbit. A mildly displaced fracture extends from the site into the adjacent right sphenoid bone. 2. Adjacent minimally displaced and nondisplaced fractures within the right orbital roof/right frontal calvarium. 3. Acute comminuted and displaced fracture of the right orbital floor, extending into the inferior orbital rim (with depression of fracture fragments by up to 6 mm). 4. Suspected acute, nondisplaced fracture of the right lamina papyracea , 5. Extensive acute comminuted and displaced fractures of the anterior and posterolateral walls of the right maxillary sinus. Acute fractures also extend into the floor of the right maxillary sinus/maxillary alveolar process. 6. Acute mildly displaced fracture of the right zygomatic arch. 7. Extraconal and intraconal/retrobulbar hemorrhage within the right orbit. Hemorrhage surrounds the right lateral and  inferior rectus muscles. Correlate with physical exam findings to exclude extraocular muscle entrapment. 8. Extensive gas within the right orbit. 9. Right proptosis. 10. Right periorbital/facial hematoma and subcutaneous gas. CT cervical spine: 1. No evidence of acute cervical spine fracture. 2. Dextrocurvature of the cervical spine. 3. C6-C7 vertebral ankylosis. 4. Cervical spondylosis as described. Electronically Signed: By: Jackey Loge D.O. On: 03/24/2023 15:13   CT Cervical Spine Wo Contrast Addendum Date: 03/24/2023 ADDENDUM REPORT: 03/24/2023 16:18 ADDENDUM: Acute findings discussed with Dr. Estell Harpin by telephone at approximately 3:15 p.m. on 03/24/2023. Electronically Signed   By:  Jackey Loge D.O.   On: 03/24/2023 16:18   Result Date: 03/24/2023 CLINICAL DATA:  Provided history: Head trauma, moderate/severe. Facial trauma, blunt. Neck trauma, intoxicated or obtunded. Additional interbody: Fall (striking face), right eye swelling, right eye laceration. EXAM: CT HEAD WITHOUT CONTRAST CT MAXILLOFACIAL WITHOUT CONTRAST CT CERVICAL SPINE WITHOUT CONTRAST TECHNIQUE: Multidetector CT imaging of the head, cervical spine, and maxillofacial structures were performed using the standard protocol without intravenous contrast. Multiplanar CT image reconstructions of the cervical spine and maxillofacial structures were also generated. RADIATION DOSE REDUCTION: This exam was performed according to the departmental dose-optimization program which includes automated exposure control, adjustment of the mA and/or kV according to patient size and/or use of iterative reconstruction technique. COMPARISON:  Head CT 03/18/2023. Cervical spine CT 03/18/2023. Report from thyroid ultrasound 03/22/2013. FINDINGS: CT HEAD FINDINGS Brain: Generalized cerebral atrophy. Patchy and ill-defined hypoattenuation within the cerebral white matter, nonspecific but compatible with mild-to-moderate chronic small vessel ischemic disease. There is  no acute intracranial hemorrhage. No demarcated cortical infarct. No extra-axial fluid collection. No evidence of an intracranial mass. No midline shift. Vascular: No hyperdense vessel.  Atherosclerotic calcifications. Skull: Acute, comminuted and displaced fracture of the lateral wall of the right bony orbit, extending into the adjacent right sphenoid bone. Adjacent minimally displaced and nondisplaced fractures within the right orbital roof/right frontal calvarium (for instance as seen on maxillofacial CT, image 25) (maxillofacial CT series 4, images 19-21). Additional nondisplaced acute fracture of the right frontal calvarium immediately above the superior orbital rim (for instance as seen on maxillofacial CT series 5, image 11). Other: Right frontal, parietal and temporal scalp hematoma. CT MAXILLOFACIAL FINDINGS Osseous: Acute comminuted and displaced fracture of the lateral wall of the right bony orbit. A mildly displaced fracture extends from the site into the adjacent right sphenoid bone. Additionally, there are adjacent minimally displaced and nondisplaced fractures within the right orbital roof/right frontal calvarium. Acute comminuted and displaced fracture of the right orbital floor (with depression of fracture fragments by up to 6 mm). Suspected acute, nondisplaced fracture of the right lamina papyracea (for instance as seen on series 9, image 31). Acute comminuted and displaced fractures of the anterior and posterolateral walls of the right maxillary sinus. Acute fractures also extend into the floor of the right maxillary sinus/maxillary alveolar process. No definite acute fracture of the medial wall of the right maxillary sinus is identified. Acute mildly displaced fracture of the right zygomatic arch. Orbits: Acute right orbital fractures as described above. Extensive gas within the right orbit and right periorbital region. Extraconal and intraconal/retrobulbar hemorrhage within the right orbit.  Hemorrhage surrounds the right lateral and inferior rectus muscles. Right proptosis. Sinuses: Extensive hyperdense opacification of the right maxillary sinus consistent with the presence of hemorrhage. Mild nonspecific partial opacification of right ethmoid air cells. Soft tissues: Right periorbital and facial hematoma. Extensive soft tissue gas in the right periorbital region and right face. Other: Nonspecific partial opacification of the right nasal passage. CT CERVICAL SPINE FINDINGS Alignment: Dextrocurvature of the cervical spine. No significant spondylolisthesis. Skull base and vertebrae: The basion-dental and atlanto-dental intervals are maintained.No evidence of acute fracture to the cervical spine. C6-C7 vertebral body ankylosis. Soft tissues and spinal canal: No prevertebral fluid or swelling. No visible canal hematoma. Incompletely imaged left thyroid lobe nodule. Per radiology records, this nodule was previously biopsied on 03/22/2013. Correlate with the pathology report from this prior procedure. Disc levels: Cervical spondylosis with multilevel disc space narrowing, disc bulges/disc protrusions, posterior disc osteophyte  complexes, uncovertebral hypertrophy and facet arthropathy. Fusion across the C6-C7 disc space. At the non-fused levels, disc space narrowing is greatest at C5-C6 (advanced at this level). No appreciable high-grade spinal canal stenosis. Multilevel bony neural from narrowing. Degenerative changes also present at the C1-C2 articulation. Upper chest: No acute finding. Incompletely imaged, but known, left upper lobe pulmonary nodule. Attempts are being made to reach the ordering provider at this time. IMPRESSION: CT head: 1.  No evidence of an acute intracranial abnormality. 2. Acute, nondisplaced fracture of the right frontal calvarium (immediately above the right superior orbital rim). Acute orbital/facial fractures with fractures extending into the calvarium as described in the  maxillofacial findings/impression section. 3. Right frontal, parietal and temporal scalp hematoma. 4. Parenchymal atrophy and chronic small vessel ischemic disease. CT maxillofacial: 1. Acute comminuted and displaced fracture of the lateral wall of the right bony orbit. A mildly displaced fracture extends from the site into the adjacent right sphenoid bone. 2. Adjacent minimally displaced and nondisplaced fractures within the right orbital roof/right frontal calvarium. 3. Acute comminuted and displaced fracture of the right orbital floor, extending into the inferior orbital rim (with depression of fracture fragments by up to 6 mm). 4. Suspected acute, nondisplaced fracture of the right lamina papyracea , 5. Extensive acute comminuted and displaced fractures of the anterior and posterolateral walls of the right maxillary sinus. Acute fractures also extend into the floor of the right maxillary sinus/maxillary alveolar process. 6. Acute mildly displaced fracture of the right zygomatic arch. 7. Extraconal and intraconal/retrobulbar hemorrhage within the right orbit. Hemorrhage surrounds the right lateral and inferior rectus muscles. Correlate with physical exam findings to exclude extraocular muscle entrapment. 8. Extensive gas within the right orbit. 9. Right proptosis. 10. Right periorbital/facial hematoma and subcutaneous gas. CT cervical spine: 1. No evidence of acute cervical spine fracture. 2. Dextrocurvature of the cervical spine. 3. C6-C7 vertebral ankylosis. 4. Cervical spondylosis as described. Electronically Signed: By: Jackey Loge D.O. On: 03/24/2023 15:13   CT Maxillofacial Wo Contrast Addendum Date: 03/24/2023 ADDENDUM REPORT: 03/24/2023 16:18 ADDENDUM: Acute findings discussed with Dr. Estell Harpin by telephone at approximately 3:15 p.m. on 03/24/2023. Electronically Signed   By: Jackey Loge D.O.   On: 03/24/2023 16:18   Result Date: 03/24/2023 CLINICAL DATA:  Provided history: Head trauma,  moderate/severe. Facial trauma, blunt. Neck trauma, intoxicated or obtunded. Additional interbody: Fall (striking face), right eye swelling, right eye laceration. EXAM: CT HEAD WITHOUT CONTRAST CT MAXILLOFACIAL WITHOUT CONTRAST CT CERVICAL SPINE WITHOUT CONTRAST TECHNIQUE: Multidetector CT imaging of the head, cervical spine, and maxillofacial structures were performed using the standard protocol without intravenous contrast. Multiplanar CT image reconstructions of the cervical spine and maxillofacial structures were also generated. RADIATION DOSE REDUCTION: This exam was performed according to the departmental dose-optimization program which includes automated exposure control, adjustment of the mA and/or kV according to patient size and/or use of iterative reconstruction technique. COMPARISON:  Head CT 03/18/2023. Cervical spine CT 03/18/2023. Report from thyroid ultrasound 03/22/2013. FINDINGS: CT HEAD FINDINGS Brain: Generalized cerebral atrophy. Patchy and ill-defined hypoattenuation within the cerebral white matter, nonspecific but compatible with mild-to-moderate chronic small vessel ischemic disease. There is no acute intracranial hemorrhage. No demarcated cortical infarct. No extra-axial fluid collection. No evidence of an intracranial mass. No midline shift. Vascular: No hyperdense vessel.  Atherosclerotic calcifications. Skull: Acute, comminuted and displaced fracture of the lateral wall of the right bony orbit, extending into the adjacent right sphenoid bone. Adjacent minimally displaced and nondisplaced fractures within the right  orbital roof/right frontal calvarium (for instance as seen on maxillofacial CT, image 25) (maxillofacial CT series 4, images 19-21). Additional nondisplaced acute fracture of the right frontal calvarium immediately above the superior orbital rim (for instance as seen on maxillofacial CT series 5, image 11). Other: Right frontal, parietal and temporal scalp hematoma. CT  MAXILLOFACIAL FINDINGS Osseous: Acute comminuted and displaced fracture of the lateral wall of the right bony orbit. A mildly displaced fracture extends from the site into the adjacent right sphenoid bone. Additionally, there are adjacent minimally displaced and nondisplaced fractures within the right orbital roof/right frontal calvarium. Acute comminuted and displaced fracture of the right orbital floor (with depression of fracture fragments by up to 6 mm). Suspected acute, nondisplaced fracture of the right lamina papyracea (for instance as seen on series 9, image 31). Acute comminuted and displaced fractures of the anterior and posterolateral walls of the right maxillary sinus. Acute fractures also extend into the floor of the right maxillary sinus/maxillary alveolar process. No definite acute fracture of the medial wall of the right maxillary sinus is identified. Acute mildly displaced fracture of the right zygomatic arch. Orbits: Acute right orbital fractures as described above. Extensive gas within the right orbit and right periorbital region. Extraconal and intraconal/retrobulbar hemorrhage within the right orbit. Hemorrhage surrounds the right lateral and inferior rectus muscles. Right proptosis. Sinuses: Extensive hyperdense opacification of the right maxillary sinus consistent with the presence of hemorrhage. Mild nonspecific partial opacification of right ethmoid air cells. Soft tissues: Right periorbital and facial hematoma. Extensive soft tissue gas in the right periorbital region and right face. Other: Nonspecific partial opacification of the right nasal passage. CT CERVICAL SPINE FINDINGS Alignment: Dextrocurvature of the cervical spine. No significant spondylolisthesis. Skull base and vertebrae: The basion-dental and atlanto-dental intervals are maintained.No evidence of acute fracture to the cervical spine. C6-C7 vertebral body ankylosis. Soft tissues and spinal canal: No prevertebral fluid or  swelling. No visible canal hematoma. Incompletely imaged left thyroid lobe nodule. Per radiology records, this nodule was previously biopsied on 03/22/2013. Correlate with the pathology report from this prior procedure. Disc levels: Cervical spondylosis with multilevel disc space narrowing, disc bulges/disc protrusions, posterior disc osteophyte complexes, uncovertebral hypertrophy and facet arthropathy. Fusion across the C6-C7 disc space. At the non-fused levels, disc space narrowing is greatest at C5-C6 (advanced at this level). No appreciable high-grade spinal canal stenosis. Multilevel bony neural from narrowing. Degenerative changes also present at the C1-C2 articulation. Upper chest: No acute finding. Incompletely imaged, but known, left upper lobe pulmonary nodule. Attempts are being made to reach the ordering provider at this time. IMPRESSION: CT head: 1.  No evidence of an acute intracranial abnormality. 2. Acute, nondisplaced fracture of the right frontal calvarium (immediately above the right superior orbital rim). Acute orbital/facial fractures with fractures extending into the calvarium as described in the maxillofacial findings/impression section. 3. Right frontal, parietal and temporal scalp hematoma. 4. Parenchymal atrophy and chronic small vessel ischemic disease. CT maxillofacial: 1. Acute comminuted and displaced fracture of the lateral wall of the right bony orbit. A mildly displaced fracture extends from the site into the adjacent right sphenoid bone. 2. Adjacent minimally displaced and nondisplaced fractures within the right orbital roof/right frontal calvarium. 3. Acute comminuted and displaced fracture of the right orbital floor, extending into the inferior orbital rim (with depression of fracture fragments by up to 6 mm). 4. Suspected acute, nondisplaced fracture of the right lamina papyracea , 5. Extensive acute comminuted and displaced fractures of the anterior  and posterolateral walls of  the right maxillary sinus. Acute fractures also extend into the floor of the right maxillary sinus/maxillary alveolar process. 6. Acute mildly displaced fracture of the right zygomatic arch. 7. Extraconal and intraconal/retrobulbar hemorrhage within the right orbit. Hemorrhage surrounds the right lateral and inferior rectus muscles. Correlate with physical exam findings to exclude extraocular muscle entrapment. 8. Extensive gas within the right orbit. 9. Right proptosis. 10. Right periorbital/facial hematoma and subcutaneous gas. CT cervical spine: 1. No evidence of acute cervical spine fracture. 2. Dextrocurvature of the cervical spine. 3. C6-C7 vertebral ankylosis. 4. Cervical spondylosis as described. Electronically Signed: By: Jackey Loge D.O. On: 03/24/2023 15:13   DG Chest Port 1 View Result Date: 03/24/2023 CLINICAL DATA:  Shortness of breath EXAM: PORTABLE CHEST 1 VIEW COMPARISON:  07/13/2021 FINDINGS: Right-sided central venous port tip over the SVC. Hyperinflation. No acute airspace disease, pleural effusion or pneumothorax. Normal cardiomediastinal contour. IMPRESSION: No active disease. Hyperinflation. Electronically Signed   By: Jasmine Pang M.D.   On: 03/24/2023 15:51     Medical Consultants:   None.   Subjective:    [name] relates he feels close to baseline except for pain.  Has not had a bowel movement.  Objective:    Vitals:   03/26/23 0530 03/26/23 0545 03/26/23 0600 03/26/23 0615  BP: 124/70 (!) 92/57 104/69 130/72  Pulse: 79 86 90 76  Resp: 14 11 10  (!) 5  Temp:      TempSrc:      SpO2: 92% 91% (!) 89% 94%  Weight:      Height:       SpO2: 94 % O2 Flow Rate (L/min): 2 L/min   Intake/Output Summary (Last 24 hours) at 03/26/2023 0650 Last data filed at 03/26/2023 0015 Gross per 24 hour  Intake 832.56 ml  Output 1925 ml  Net -1092.44 ml   Filed Weights   03/24/23 1246  Weight: 80.7 kg    Exam: General exam: In no acute distress. Respiratory  system: Good air movement and clear to auscultation. Cardiovascular system: S1 & S2 heard, RRR. No JVD.  Gastrointestinal system: Abdomen is nondistended, soft and nontender.  Extremities: No pedal edema. Skin: No rashes, lesions or ulcers Psychiatry: Judgement and insight appear normal. Mood & affect appropriate.    Data Reviewed:    Labs: Basic Metabolic Panel: Recent Labs  Lab 03/24/23 1339 03/24/23 2342 03/25/23 0417  NA 139  --  139  K 4.0  --  4.1  CL 97*  --  107  CO2 30  --  26  GLUCOSE 127*  --  110*  BUN 29*  --  28*  CREATININE 1.22 1.09 1.01  CALCIUM 9.6  --  7.9*   GFR Estimated Creatinine Clearance: 75.5 mL/min (by C-G formula based on SCr of 1.01 mg/dL). Liver Function Tests: Recent Labs  Lab 03/24/23 1339  AST 21  ALT 15  ALKPHOS 89  BILITOT 0.9  PROT 8.0  ALBUMIN 4.3   No results for input(s): "LIPASE", "AMYLASE" in the last 168 hours. No results for input(s): "AMMONIA" in the last 168 hours. Coagulation profile Recent Labs  Lab 03/24/23 2030  INR 1.1   COVID-19 Labs  No results for input(s): "DDIMER", "FERRITIN", "LDH", "CRP" in the last 72 hours.  Lab Results  Component Value Date   SARSCOV2NAA NEGATIVE 03/24/2023    CBC: Recent Labs  Lab 03/24/23 1339 03/24/23 1950 03/24/23 2342 03/25/23 0334  WBC 9.0 15.5* 12.7* 11.5*  NEUTROABS 8.1*  --   --  10.1*  HGB 14.6 12.0* 9.5* 9.1*  HCT 44.9 36.5* 32.4* 27.6*  MCV 93.5 92.6 103.2* 93.6  PLT 180 170 119* 119*   Cardiac Enzymes: No results for input(s): "CKTOTAL", "CKMB", "CKMBINDEX", "TROPONINI" in the last 168 hours. BNP (last 3 results) No results for input(s): "PROBNP" in the last 8760 hours. CBG: Recent Labs  Lab 03/25/23 1143 03/25/23 1615 03/25/23 1916 03/25/23 2308 03/26/23 0306  GLUCAP 87 97 104* 104* 83   D-Dimer: No results for input(s): "DDIMER" in the last 72 hours. Hgb A1c: Recent Labs    03/25/23 0837  HGBA1C 5.3   Lipid Profile: No results for  input(s): "CHOL", "HDL", "LDLCALC", "TRIG", "CHOLHDL", "LDLDIRECT" in the last 72 hours. Thyroid function studies: No results for input(s): "TSH", "T4TOTAL", "T3FREE", "THYROIDAB" in the last 72 hours.  Invalid input(s): "FREET3" Anemia work up: No results for input(s): "VITAMINB12", "FOLATE", "FERRITIN", "TIBC", "IRON", "RETICCTPCT" in the last 72 hours. Sepsis Labs: Recent Labs  Lab 03/24/23 1339 03/24/23 1950 03/24/23 2030 03/24/23 2342 03/25/23 0334 03/25/23 1218  WBC 9.0 15.5*  --  12.7* 11.5*  --   LATICACIDVEN  --   --  1.8  --   --  1.3   Microbiology Recent Results (from the past 240 hours)  Resp panel by RT-PCR (RSV, Flu A&B, Covid) Anterior Nasal Swab     Status: None   Collection Time: 03/24/23  3:55 PM   Specimen: Anterior Nasal Swab  Result Value Ref Range Status   SARS Coronavirus 2 by RT PCR NEGATIVE NEGATIVE Final    Comment: (NOTE) SARS-CoV-2 target nucleic acids are NOT DETECTED.  The SARS-CoV-2 RNA is generally detectable in upper respiratory specimens during the acute phase of infection. The lowest concentration of SARS-CoV-2 viral copies this assay can detect is 138 copies/mL. A negative result does not preclude SARS-Cov-2 infection and should not be used as the sole basis for treatment or other patient management decisions. A negative result may occur with  improper specimen collection/handling, submission of specimen other than nasopharyngeal swab, presence of viral mutation(s) within the areas targeted by this assay, and inadequate number of viral copies(<138 copies/mL). A negative result must be combined with clinical observations, patient history, and epidemiological information. The expected result is Negative.  Fact Sheet for Patients:  BloggerCourse.com  Fact Sheet for Healthcare Providers:  SeriousBroker.it  This test is no t yet approved or cleared by the [place] FDA and  has been  authorized for detection and/or diagnosis of SARS-CoV-2 by FDA under an Emergency Use Authorization (EUA). This EUA will remain  in effect (meaning this test can be used) for the duration of the COVID-19 declaration under Section 564(b)(1) of the Act, 21 U.S.C.section 360bbb-3(b)(1), unless the authorization is terminated  or revoked sooner.       Influenza A by PCR NEGATIVE NEGATIVE Final   Influenza B by PCR NEGATIVE NEGATIVE Final    Comment: (NOTE) The Xpert Xpress SARS-CoV-2/FLU/RSV plus assay is intended as an aid in the diagnosis of influenza from Nasopharyngeal swab specimens and should not be used as a sole basis for treatment. Nasal washings and aspirates are unacceptable for Xpert Xpress SARS-CoV-2/FLU/RSV testing.  Fact Sheet for Patients: BloggerCourse.com  Fact Sheet for Healthcare Providers: SeriousBroker.it  This test is not yet approved or cleared by the [place] FDA and has been authorized for detection and/or diagnosis of SARS-CoV-2 by FDA under an Emergency Use Authorization (EUA). This EUA will  remain in effect (meaning this test can be used) for the duration of the COVID-19 declaration under Section 564(b)(1) of the Act, 21 U.S.C. section 360bbb-3(b)(1), unless the authorization is terminated or revoked.     Resp Syncytial Virus by PCR NEGATIVE NEGATIVE Final    Comment: (NOTE) Fact Sheet for Patients: BloggerCourse.com  Fact Sheet for Healthcare Providers: SeriousBroker.it  This test is not yet approved or cleared by the [place] FDA and has been authorized for detection and/or diagnosis of SARS-CoV-2 by FDA under an Emergency Use Authorization (EUA). This EUA will remain in effect (meaning this test can be used) for the duration of the COVID-19 declaration under Section 564(b)(1) of the Act, 21 U.S.C. section 360bbb-3(b)(1), unless the  authorization is terminated or revoked.  Performed at [facility], [address] [place], [place] [address]   Blood Culture (routine x 2)     Status: None (Preliminary result)   Collection Time: 03/24/23  8:30 PM   Specimen: BLOOD RIGHT FOREARM  Result Value Ref Range Status   Specimen Description   Final    BLOOD RIGHT FOREARM BOTTLES DRAWN AEROBIC AND ANAEROBIC   Special Requests   Final    Blood Culture results may not be optimal due to an inadequate volume of blood received in culture bottles   Culture   Final    NO GROWTH < 12 HOURS Performed at [facility], [address], [place], [place] [address]    Report Status PENDING  Incomplete  Blood Culture (routine x 2)     Status: None (Preliminary result)   Collection Time: 03/24/23  8:33 PM   Specimen: BLOOD LEFT HAND  Result Value Ref Range Status   Specimen Description   Final    BLOOD LEFT HAND BOTTLES DRAWN AEROBIC AND ANAEROBIC   Special Requests   Final    Blood Culture results may not be optimal due to an inadequate volume of blood received in culture bottles   Culture   Final    NO GROWTH < 12 HOURS Performed at [facility], [address], [place], [place] [address]    Report Status PENDING  Incomplete     Medications:    acetaminophen  1,000 mg Oral Q6H   citalopram  40 mg Oral Daily   docusate sodium  100 mg Oral BID   enoxaparin (LOVENOX) injection  30 mg Subcutaneous Q12H   erythromycin   Right Eye Q8H   insulin aspart  0-15 Units Subcutaneous TID WC   methocarbamol  500 mg Oral Q8H   Or   methocarbamol (ROBAXIN) injection  500 mg Intravenous Q8H   OLANZapine  5 mg Oral QHS   rosuvastatin  10 mg Oral QHS   tamsulosin  0.4 mg Oral Daily   Continuous Infusions:  norepinephrine (LEVOPHED) Adult infusion 2 mcg/min (03/25/23 1123)   piperacillin-tazobactam (ZOSYN)  IV 3.375 g (03/26/23 0520)   vancomycin Stopped (03/25/23 2314)      LOS: 2 days   Marinda Elk  [facility]  03/26/2023, 6:50 AM

## 2023-03-26 NOTE — Evaluation (Signed)
 Physical Therapy Evaluation Patient Details Name: [name] MRN: [id number] DOB: September 07, 1950 Today's Date: 03/26/2023  History of Present Illness  73 yo male admitted 3/12 after fall [place] with laceration of Rt eye, facial fx, periprosthetic right hip fx. PMhx: T2DM, tonsillar and lung CA s/p chemoradiation, chronic urinary retention self-catheterization, HLD, depression, cognitive impairment, fibromyalgia, PEG  Clinical Impression  Pt pleasant, reports living alone and having family who can assist at D/C. Pt able to perform transfers and gait with min assist and needs cues to maintain no active Rt hip abduction. Pt reliant on supplemental O2 at 4-5L with activity to achieve >90% and HR 127-145 with activity. Pt with decreased transfers, gait, safety and function who will benefit from acute therapy and HHPT to maximize mobility and independence.         If plan is discharge home, recommend the following: A little help with walking and/or transfers;A little help with bathing/dressing/bathroom;Assistance with cooking/housework;Assist for transportation;Help with stairs or ramp for entrance   Can travel by private vehicle        Equipment Recommendations Rolling walker (2 wheels)  Recommendations for Other Services       Functional Status Assessment Patient has had a recent decline in their functional status and demonstrates the ability to make significant improvements in function in a reasonable and predictable amount of time.     Precautions / Restrictions Precautions Precautions: Fall;Other (comment) Recall of Precautions/Restrictions: Intact Precaution/Restrictions Comments: Watch O2 Restrictions Weight Bearing Restrictions Per Provider Order: Yes RLE Weight Bearing Per Provider Order: Weight bearing as tolerated Other Position/Activity Restrictions: No active hip abduction      Mobility  Bed Mobility Overal bed mobility: Needs Assistance Bed Mobility: Supine to Sit      Supine to sit: HOB elevated, Min assist, Used rails     General bed mobility comments: HOB 30 degrees, min assist to clear legs with assist for lines    Transfers Overall transfer level: Needs assistance   Transfers: Sit to/from Stand Sit to Stand: Min assist           General transfer comment: min assist to rise from bed with cues for hand placement, cues to direct self to middle of chair to sit    Ambulation/Gait Ambulation/Gait assistance: Min assist Gait Distance (Feet): 50 Feet Assistive device: Rolling walker (2 wheels) Gait Pattern/deviations: Step-through pattern, Decreased stride length   Gait velocity interpretation: <1.8 ft/sec, indicate of risk for recurrent falls   General Gait Details: limited by fatigue, pt required 5L to maintain 90% with activity  Stairs            Wheelchair Mobility     Tilt Bed    Modified Rankin (Stroke Patients Only)       Balance Overall balance assessment: Needs assistance, History of Falls Sitting-balance support: No upper extremity supported, Feet supported Sitting balance-Leahy Scale: Fair     Standing balance support: Bilateral upper extremity supported, During functional activity, Reliant on assistive device for balance Standing balance-Leahy Scale: Poor Standing balance comment: Rw in standing                             Pertinent Vitals/Pain Pain Assessment Pain Score: 3  Pain Location: Rt hip with transition OOB Pain Descriptors / Indicators: Aching Pain Intervention(s): Limited activity within patient's tolerance, Monitored during session, Repositioned    Home Living Family/patient expects to be discharged to:: Private residence Living Arrangements:  Alone Available Help at Discharge: Family;Available PRN/intermittently Type of Home: Mobile home Home Access: Stairs to enter Entrance Stairs-Rails: Right;Left;Can reach both Entrance Stairs-Number of Steps: 2   Home Layout: One  level Home Equipment: Cane - single point;Hand held shower head;Grab bars - tub/shower;Shower seat      Prior Function Prior Level of Function : Independent/Modified Independent;Driving             Mobility Comments: Uses a SPC for mobility ADLs Comments: Patient reports Ind with ADL, iADL and community mobility.  Has family that lives across the street and a grandchild that will assist as needed once home.     Extremity/Trunk Assessment   Upper Extremity Assessment Upper Extremity Assessment: Generalized weakness    Lower Extremity Assessment Lower Extremity Assessment: Generalized weakness (Rt hip abduction limited per orders)    Cervical / Trunk Assessment Cervical / Trunk Assessment: Normal  Communication   Communication Communication: Impaired Factors Affecting Communication: Reduced clarity of speech    Cognition Arousal: Alert Behavior During Therapy: WFL for tasks assessed/performed   PT - Cognitive impairments: No family/caregiver present to determine baseline, Problem solving                       PT - Cognition Comments: slightly delayed processing Following commands: Intact       Cueing Cueing Techniques: Verbal cues, Gestural cues, Tactile cues     General Comments      Exercises     Assessment/Plan    PT Assessment Patient needs continued PT services  PT Problem List Decreased strength;Decreased activity tolerance;Decreased balance;Decreased mobility;Decreased knowledge of use of DME;Cardiopulmonary status limiting activity       PT Treatment Interventions Gait training;Stair training;Functional mobility training;Therapeutic activities;Therapeutic exercise;Patient/family education;Cognitive remediation;DME instruction;Balance training;Neuromuscular re-education    PT Goals (Current goals can be found in the Care Plan section)  Acute Rehab PT Goals Patient Stated Goal: return home PT Goal Formulation: With patient Time For Goal  Achievement: 04/09/23 Potential to Achieve Goals: Good    Frequency Min 2X/week     Co-evaluation               AM-PAC PT "6 Clicks" Mobility  Outcome Measure Help needed turning from your back to your side while in a flat bed without using bedrails?: A Little Help needed moving from lying on your back to sitting on the side of a flat bed without using bedrails?: A Little Help needed moving to and from a bed to a chair (including a wheelchair)?: A Little Help needed standing up from a chair using your arms (e.g., wheelchair or bedside chair)?: A Little Help needed to walk in hospital room?: A Little Help needed climbing 3-5 steps with a railing? : A Lot 6 Click Score: 17    End of Session Equipment Utilized During Treatment: Gait belt;Oxygen Activity Tolerance: Patient tolerated treatment well Patient left: in chair;with call bell/phone within reach;with chair alarm set Nurse Communication: Mobility status PT Visit Diagnosis: Other abnormalities of gait and mobility (R26.89);Difficulty in walking, not elsewhere classified (R26.2);History of falling (Z91.81)    Time: 1610-9604 PT Time Calculation (min) (ACUTE ONLY): 21 min   Charges:   PT Evaluation $PT Eval Moderate Complexity: 1 Mod   PT General Charges $$ ACUTE PT VISIT: 1 Visit         Merryl Hacker, PT Acute Rehabilitation Services Office: [phone]   Enedina Finner Royston Bekele 03/26/2023, 10:38 AM

## 2023-03-26 NOTE — TOC Initial Note (Signed)
 Transition of Care [facility]) - Initial/Assessment Note    Patient Details  Name: [name] MRN: [id number] Date of Birth: [birth year]  Transition of Care [facility]) CM/SW Contact:    Glennon Mac, RN Phone Number: 03/26/2023, 5:02 PM  Clinical Narrative:                 73 yo male admitted 3/12 after fall at bank with laceration of Rt eye, facial fx, periprosthetic right hip fx.  PTA, pt independent and living at home alone with family across the street.  He states he has a grandchild who can provide assistance at home when discharged. PT recommending HH follow up; OT recommending no OP follow up.  RW and BSC recommended for home. Will follow for home needs as patient progresses.    Expected Discharge Plan: Home w Home Health Services Barriers to Discharge: Continued Medical Work up   Expected Discharge Plan and Services   Discharge Planning Services: CM Consult   Living arrangements for the past 2 months: Single Family Home                                      Prior Living Arrangements/Services Living arrangements for the past 2 months: Single Family Home Lives with:: Self Patient language and need for interpreter reviewed:: Yes Do you feel safe going back to the place where you live?: Yes      Need for Family Participation in Patient Care: Yes (Comment) Care giver support system in place?: Yes (comment)   Criminal Activity/Legal Involvement Pertinent to Current Situation/Hospitalization: No - Comment as needed  Activities of Daily Living   ADL Screening (condition at time of admission) Independently performs ADLs?: Yes (appropriate for developmental age) Is the patient deaf or have difficulty hearing?: Yes Does the patient have difficulty seeing, even when wearing glasses/contacts?: Yes Does the patient have difficulty concentrating, remembering, or making decisions?: No                 Emotional Assessment Appearance:: Appears stated  age Attitude/Demeanor/Rapport: Engaged Affect (typically observed): Accepting Orientation: : Oriented to Self, Oriented to Place, Oriented to  Time, Oriented to Situation      Admission diagnosis:  Head injury [S09.90XA] Injury of head, initial encounter [S09.90XA] Critical polytrauma [T07.XXXA] Patient Active Problem List   Diagnosis Date Noted   Right lower lobe pulmonary infiltrate 03/26/2023   Acute respiratory failure with hypoxia (HCC) 03/26/2023   Pressure injury of skin 03/26/2023   UTI (urinary tract infection) 03/26/2023   Aspiration pneumonia of right lower lobe (HCC) 03/25/2023   Head injury 03/24/2023   Critical polytrauma 03/24/2023   Delirium    Anuria 01/18/2018   Acute retention of urine 01/17/2018   Acute metabolic encephalopathy 01/15/2018   Dehydration 01/15/2018   Chronic pain syndrome 01/15/2018   Scalp laceration    Metastasis to lung (HCC) 10/19/2013   Mucositis due to antineoplastic therapy 06/10/2013   Protein-calorie malnutrition, severe (HCC) 06/10/2013   Rash 06/10/2013   Gastrostomy tube dyslodgement - replaced 05/06/2013 05/06/2013   Gastrostomy in place [facility]) 04/04/2013   Tonsil cancer (HCC) 03/22/2013   Diabetic neuropathy, painful (HCC) 03/22/2013   Diabetes mellitus, type 2 (HCC) 10/18/2012   Other and unspecified hyperlipidemia 10/18/2012   Obesity, unspecified 10/18/2012   PCP:  Assunta Found, MD Pharmacy:   [facility] - [place], [place] - [address]  [address] [place] [address] Phone: [phone] Fax: [phone]     Social Drivers of Health (SDOH) Social History: SDOH Screenings   Food Insecurity: No Food Insecurity (03/25/2023)  Housing: Low Risk  (03/25/2023)  Transportation Needs: No Transportation Needs (03/25/2023)  Utilities: Not At Risk (03/25/2023)  Social Connections: Socially Isolated (03/25/2023)  Tobacco Use: Low Risk  (03/24/2023)   SDOH Interventions:     Readmission Risk  Interventions     No data to display         Quintella Baton, RN, BSN  Trauma/Neuro ICU Case Manager [phone]

## 2023-03-26 NOTE — Evaluation (Signed)
 Occupational Therapy Evaluation Patient Details Name: [name] MRN: [id number] DOB: [birth year] Today's Date: 03/26/2023   History of Present Illness   73 yo male admitted 3/12 after fall at bank with laceration of Rt eye, facial fx, periprosthetic right hip fx. PMhx: T2DM, tonsillar and lung CA s/p chemoradiation, chronic urinary retention self-catheterization, HLD, depression, cognitive impairment, fibromyalgia, PEG     Clinical Impressions Patient admitted for the diagnosis above.  PTA he lives at home alone, but does have family that can assist as needed post acute.  R hip pain and O2 need are the deficits, currently needing up to Mod A for lower body ADL and up to Min A for basic mobility.  OT will continue efforts in the acute setting, but no post acute OT is anticipated.       If plan is discharge home, recommend the following:   Assist for transportation;Assistance with cooking/housework;Help with stairs or ramp for entrance     Functional Status Assessment   Patient has had a recent decline in their functional status and demonstrates the ability to make significant improvements in function in a reasonable and predictable amount of time.     Equipment Recommendations   BSC/3in1     Recommendations for Other Services         Precautions/Restrictions   Precautions Precautions: Fall Recall of Precautions/Restrictions: Intact Precaution/Restrictions Comments: Watch O2 Restrictions Weight Bearing Restrictions Per Provider Order: Yes RLE Weight Bearing Per Provider Order: Weight bearing as tolerated Other Position/Activity Restrictions: No active hip abduction     Mobility Bed Mobility               General bed mobility comments: up in the recliner    Transfers Overall transfer level: Needs assistance   Transfers: Sit to/from Stand, Bed to chair/wheelchair/BSC Sit to Stand: Min assist     Step pivot transfers: Contact guard assist,  Supervision            Balance Overall balance assessment: Needs assistance Sitting-balance support: Feet supported Sitting balance-Leahy Scale: Good     Standing balance support: Reliant on assistive device for balance Standing balance-Leahy Scale: Poor                             ADL either performed or assessed with clinical judgement   ADL       Grooming: Wash/dry hands;Wash/dry face;Supervision/safety;Standing               Lower Body Dressing: Minimal assistance;Sit to/from stand;Moderate assistance   Toilet Transfer: Contact guard assist;Rolling walker (2 wheels);Regular Toilet;Ambulation                   Vision Patient Visual Report: Blurring of vision Additional Comments: R eye swollen post fall with facial fractures     Perception Perception: Not tested       Praxis Praxis: Not tested       Pertinent Vitals/Pain Pain Assessment Pain Assessment: Faces Faces Pain Scale: Hurts little more Pain Location: R hip with sit to stand and steps Pain Descriptors / Indicators: Sore, Tender Pain Intervention(s): Monitored during session     Extremity/Trunk Assessment Upper Extremity Assessment Upper Extremity Assessment: Overall WFL for tasks assessed   Lower Extremity Assessment Lower Extremity Assessment: Defer to PT evaluation   Cervical / Trunk Assessment Cervical / Trunk Assessment: Normal   Communication Communication Factors Affecting Communication: Reduced clarity of speech   Cognition Arousal:  Alert Behavior During Therapy: [facility] for tasks assessed/performed Cognition: No apparent impairments                               Following commands: Intact                          Home Living Family/patient expects to be discharged to:: Private residence Living Arrangements: Alone Available Help at Discharge: Family;Available PRN/intermittently Type of Home: Mobile home Home Access: Stairs to  enter Entrance Stairs-Number of Steps: 2 Entrance Stairs-Rails: Right;Left;Can reach both Home Layout: One level     Bathroom Shower/Tub: Producer, television/film/video: Handicapped height Bathroom Accessibility: Yes   Home Equipment: Cane - single point;Hand held shower head;Grab bars - tub/shower;Shower seat          Prior Functioning/Environment Prior Level of Function : Independent/Modified Independent;Driving             Mobility Comments: Uses a SPC for mobility ADLs Comments: Patient reports Ind with ADL, iADL and community mobility.  Has family that lives across the street and a grandchild that will assist as needed once home.    OT Problem List: Decreased strength;Decreased range of motion;Impaired balance (sitting and/or standing);Pain   OT Treatment/Interventions: Self-care/ADL training;Therapeutic activities;Patient/family education;DME and/or AE instruction;Balance training      OT Goals(Current goals can be found in the care plan section)   Acute Rehab OT Goals Patient Stated Goal: Ready to return home OT Goal Formulation: With patient Time For Goal Achievement: 04/09/23 Potential to Achieve Goals: Good   OT Frequency:  Min 1X/week    Co-evaluation              AM-PAC OT "6 Clicks" Daily Activity     Outcome Measure Help from another person eating meals?: None Help from another person taking care of personal grooming?: A Little Help from another person toileting, which includes using toliet, bedpan, or urinal?: A Little Help from another person bathing (including washing, rinsing, drying)?: A Lot Help from another person to put on and taking off regular upper body clothing?: None Help from another person to put on and taking off regular lower body clothing?: A Lot 6 Click Score: 18   End of Session Equipment Utilized During Treatment: Rolling walker (2 wheels);Oxygen Nurse Communication: Mobility status  Activity Tolerance: Patient  tolerated treatment well Patient left: in chair;with call bell/phone within reach  OT Visit Diagnosis: Unsteadiness on feet (R26.81);History of falling (Z91.81);Pain Pain - Right/Left: Right Pain - part of body: Hip                Time: 1610-9604 OT Time Calculation (min): 21 min Charges:  OT General Charges $OT Visit: 1 Visit OT Evaluation $OT Eval Moderate Complexity: 1 Mod  03/26/2023  RP, OTR/L  Acute Rehabilitation Services  Office:  [phone]   Suzanna Obey 03/26/2023, 10:26 AM

## 2023-03-27 DIAGNOSIS — S0990XA Unspecified injury of head, initial encounter: Secondary | ICD-10-CM | POA: Diagnosis not present

## 2023-03-27 DIAGNOSIS — J690 Pneumonitis due to inhalation of food and vomit: Secondary | ICD-10-CM | POA: Diagnosis not present

## 2023-03-27 LAB — CBC
HCT: 31.3 % — ABNORMAL LOW (ref 39.0–52.0)
Hemoglobin: 10.3 g/dL — ABNORMAL LOW (ref 13.0–17.0)
MCH: 30.4 pg (ref 26.0–34.0)
MCHC: 32.9 g/dL (ref 30.0–36.0)
MCV: 92.3 fL (ref 80.0–100.0)
Platelets: 118 10*3/uL — ABNORMAL LOW (ref 150–400)
RBC: 3.39 MIL/uL — ABNORMAL LOW (ref 4.22–5.81)
RDW: 14.9 % (ref 11.5–15.5)
WBC: 9.4 10*3/uL (ref 4.0–10.5)
nRBC: 0 % (ref 0.0–0.2)

## 2023-03-27 LAB — BASIC METABOLIC PANEL
Anion gap: 10 (ref 5–15)
BUN: 17 mg/dL (ref 8–23)
CO2: 28 mmol/L (ref 22–32)
Calcium: 8.7 mg/dL — ABNORMAL LOW (ref 8.9–10.3)
Chloride: 101 mmol/L (ref 98–111)
Creatinine, Ser: 1.04 mg/dL (ref 0.61–1.24)
GFR, Estimated: >60 mL/min (ref >60)
Glucose, Bld: 105 mg/dL — ABNORMAL HIGH (ref 70–99)
Potassium: 3.7 mmol/L (ref 3.5–5.1)
Sodium: 139 mmol/L (ref 135–145)

## 2023-03-27 LAB — GLUCOSE, CAPILLARY
Glucose-Capillary: 112 mg/dL — ABNORMAL HIGH (ref 70–99)
Glucose-Capillary: 93 mg/dL (ref 70–99)
Glucose-Capillary: 110 mg/dL — ABNORMAL HIGH (ref 70–99)
Glucose-Capillary: 149 mg/dL — ABNORMAL HIGH (ref 70–99)
Glucose-Capillary: 136 mg/dL — ABNORMAL HIGH (ref 70–99)

## 2023-03-27 MED ORDER — CEFTRIAXONE SODIUM 2 G IJ SOLR
2.0000 g | INTRAMUSCULAR | Status: AC
  Administered 2023-03-27 – 2023-03-29 (×3): 2 g via INTRAVENOUS
  Filled 2023-03-27 (×3): qty 20

## 2023-03-27 MED ORDER — POTASSIUM CHLORIDE CRYS ER 20 MEQ PO TBCR
20.0000 meq | EXTENDED_RELEASE_TABLET | Freq: Once | ORAL | Status: AC
  Administered 2023-03-27: 20 meq via ORAL
  Filled 2023-03-27: qty 1

## 2023-03-27 MED ORDER — CHLORHEXIDINE GLUCONATE CLOTH 2 % EX PADS: 6.0000 | MEDICATED_PAD | Freq: Every day | CUTANEOUS | Status: DC

## 2023-03-27 NOTE — Progress Notes (Signed)
 Subjective: Resting in bed. Complains of soreness in right leg and ongoing cough. He has not needed IV pain medications. Cough feels productive but he is unable to get anything up. He is tolerating diet without n/v/abd pain. Still with some blurry vision in right eye but no acute worsening.  Objective: Vital signs in last 24 hours: Temp:  [97.6 F (36.4 C)-99.2 F (37.3 C)] 99.2 F (37.3 C) (03/14 0338) Pulse Rate:  [74-116] 84 (03/14 0600) Resp:  [10-21] 13 (03/14 0600) BP: (81-167)/(52-96) 167/91 (03/14 0600) SpO2:  [90 %-100 %] 94 % (03/14 0600) Weight:  [83 kg] 83 kg (03/14 0600) Last BM Date :  (PTA)  Intake/Output from previous day: 03/13 0701 - 03/14 0700 In: 387.6 [IV Piggyback:387.6] Out: 2100 [Urine:2100] Intake/Output this shift: No intake/output data recorded.  PE: Gen: NAD, laying in bed. HEENT: significant right-sided facial edema and periorbital ecchymosis and subconjunctival hemorrhage.  EOMI bilaterally. Unable to completely close right eye.   Heart: regular rate and rhythm Lungs: Rhonchi on right, CTA on left, O2 3 lpm [place].   Abd: soft, NT, ND Ext: appropriately moves all 4 extremities.  No edema Neuro: grossly intact Psych: A&Ox3  Lab Results:  Recent Labs    03/25/23 0334 03/26/23 1125  WBC 11.5* 8.6  HGB 9.1* 10.5*  HCT 27.6* 31.6*  PLT 119* 122*   BMET Recent Labs    03/25/23 0417 03/27/23 0402  NA 139 139  K 4.1 3.7  CL 107 101  CO2 26 28  GLUCOSE 110* 105*  BUN 28* 17  CREATININE 1.01 1.04  CALCIUM 7.9* 8.7*   PT/INR Recent Labs    03/24/23 2030  LABPROT 14.5  INR 1.1   CMP     Component Value Date/Time   NA 139 03/27/2023 0402   NA 142 12/16/2013 1305   K 3.7 03/27/2023 0402   K 4.7 12/16/2013 1305   CL 101 03/27/2023 0402   CO2 28 03/27/2023 0402   CO2 32 (H) 12/16/2013 1305   GLUCOSE 105 (H) 03/27/2023 0402   GLUCOSE 86 12/16/2013 1305   BUN 17 03/27/2023 0402   BUN 16.6 12/16/2013 1305   CREATININE 1.04  03/27/2023 0402   CREATININE 0.8 12/16/2013 1305   CALCIUM 8.7 (L) 03/27/2023 0402   CALCIUM 9.9 12/16/2013 1305   PROT 8.0 03/24/2023 1339   PROT 8.0 12/16/2013 1305   ALBUMIN 4.3 03/24/2023 1339   ALBUMIN 4.0 12/16/2013 1305   AST 21 03/24/2023 1339   AST 20 12/16/2013 1305   ALT 15 03/24/2023 1339   ALT 13 12/16/2013 1305   ALKPHOS 89 03/24/2023 1339   ALKPHOS 84 12/16/2013 1305   BILITOT 0.9 03/24/2023 1339   BILITOT 0.45 12/16/2013 1305   GFRNONAA >60 03/27/2023 0402   GFRAA >60 01/19/2018 1010   Lipase  No results found for: "LIPASE"     Studies/Results: ECHOCARDIOGRAM COMPLETE Result Date: 03/26/2023    ECHOCARDIOGRAM REPORT   Patient Name:   [name] Date of Exam: 03/26/2023 Medical Rec #:  [id number]  Height:       75.0 in Accession #:    [id number] Weight:       177.9 lb Date of Birth:  16-Dec-1950 BSA:          2.088 m Patient Age:    72 years   BP:           132/70 mmHg Patient Gender: M  HR:           87 bpm. Exam Location:  Inpatient Procedure: 2D Echo (Both Spectral and Color Flow Doppler were utilized during            procedure). Indications:    other cardiac sounds  History:        Patient has prior history of Echocardiogram examinations, most                 recent 10/18/2012. Risk Factors:Diabetes and Dyslipidemia.  Sonographer:    Delcie Roch RDCS Referring Phys: [phone] [employer] [facility] [name]  Sonographer Comments: Image acquisition challenging due to respiratory motion. IMPRESSIONS  1. Left ventricular ejection fraction, by estimation, is 60 to 65%. The left ventricle has normal function. The left ventricle has no regional wall motion abnormalities. Left ventricular diastolic parameters were normal.  2. Right ventricular systolic function is normal. The right ventricular size is normal.  3. The mitral valve is normal in structure. No evidence of mitral valve regurgitation. No evidence of mitral stenosis.  4. The aortic valve is tricuspid. Aortic valve  regurgitation is not visualized. Aortic valve sclerosis/calcification is present, without any evidence of aortic stenosis. Aortic valve area, by VTI measures 1.91 cm. Aortic valve mean gradient measures 8.0 mmHg. Aortic valve Vmax measures 1.88 m/s.  5. The inferior vena cava is normal in size with greater than 50% respiratory variability, suggesting right atrial pressure of 3 mmHg. FINDINGS  Left Ventricle: Left ventricular ejection fraction, by estimation, is 60 to 65%. The left ventricle has normal function. The left ventricle has no regional wall motion abnormalities. The left ventricular internal cavity size was normal in size. There is  no left ventricular hypertrophy. Left ventricular diastolic parameters were normal. Normal left ventricular filling pressure. Right Ventricle: The right ventricular size is normal. No increase in right ventricular wall thickness. Right ventricular systolic function is normal. Left Atrium: Left atrial size was normal in size. Right Atrium: Right atrial size was normal in size. Pericardium: There is no evidence of pericardial effusion. Mitral Valve: The mitral valve is normal in structure. No evidence of mitral valve regurgitation. No evidence of mitral valve stenosis. Tricuspid Valve: The tricuspid valve is normal in structure. Tricuspid valve regurgitation is not demonstrated. No evidence of tricuspid stenosis. Aortic Valve: The aortic valve is tricuspid. Aortic valve regurgitation is not visualized. Aortic valve sclerosis/calcification is present, without any evidence of aortic stenosis. Aortic valve mean gradient measures 8.0 mmHg. Aortic valve peak gradient measures 14.1 mmHg. Aortic valve area, by VTI measures 1.91 cm. Pulmonic Valve: The pulmonic valve was normal in structure. Pulmonic valve regurgitation is not visualized. No evidence of pulmonic stenosis. Aorta: The aortic root is normal in size and structure. Venous: The inferior vena cava is normal in size with  greater than 50% respiratory variability, suggesting right atrial pressure of 3 mmHg. IAS/Shunts: No atrial level shunt detected by color flow Doppler.  LEFT VENTRICLE PLAX 2D LVIDd:         4.80 cm   Diastology LVIDs:         3.30 cm   LV e' medial:    10.90 cm/s LV PW:         1.00 cm   LV E/e' medial:  5.9 LV IVS:        0.90 cm   LV e' lateral:   12.00 cm/s LVOT diam:     1.90 cm   LV E/e' lateral: 5.4 LV SV:  62 LV SV Index:   29 LVOT Area:     2.84 cm  RIGHT VENTRICLE RV Basal diam:  2.80 cm RV S prime:     15.20 cm/s TAPSE (M-mode): 1.8 cm LEFT ATRIUM             Index        RIGHT ATRIUM           Index LA diam:        3.40 cm 1.63 cm/m   RA Area:     12.80 cm LA Vol (A2C):   72.7 ml 34.82 ml/m  RA Volume:   30.00 ml  14.37 ml/m LA Vol (A4C):   39.4 ml 18.87 ml/m LA Biplane Vol: 54.5 ml 26.10 ml/m  AORTIC VALVE AV Area (Vmax):    1.91 cm AV Area (Vmean):   1.90 cm AV Area (VTI):     1.91 cm AV Vmax:           188.00 cm/s AV Vmean:          128.000 cm/s AV VTI:            0.322 m AV Peak Grad:      14.1 mmHg AV Mean Grad:      8.0 mmHg LVOT Vmax:         126.50 cm/s LVOT Vmean:        85.700 cm/s LVOT VTI:          0.217 m LVOT/AV VTI ratio: 0.67  AORTA Ao Root diam: 3.40 cm Ao Asc diam:  3.10 cm MITRAL VALVE MV Area (PHT): 2.73 cm    SHUNTS MV Decel Time: 278 msec    Systemic VTI:  0.22 m MV E velocity: 64.30 cm/s  Systemic Diam: 1.90 cm MV A velocity: 63.60 cm/s MV E/A ratio:  1.01 Armanda Magic MD Electronically signed by Armanda Magic MD Signature Date/Time: 03/26/2023/4:54:23 PM    Final     Anti-infectives: Anti-infectives (From admission, onward)    Start     Dose/Rate Route Frequency Ordered Stop   03/25/23 2200  cefTRIAXone (ROCEPHIN) 2 g in sodium chloride 0.9 % 100 mL IVPB  Status:  Discontinued        2 g 200 mL/hr over 30 Minutes Intravenous Every 24 hours 03/25/23 0513 03/25/23 0650   03/25/23 2000  vancomycin (VANCOCIN) IVPB 1000 mg/200 mL premix  Status:  Discontinued         1,000 mg 200 mL/hr over 60 Minutes Intravenous Every 12 hours 03/25/23 0726 03/26/23 1000   03/25/23 0730  piperacillin-tazobactam (ZOSYN) IVPB 3.375 g        3.375 g 12.5 mL/hr over 240 Minutes Intravenous Every 8 hours 03/25/23 0718     03/25/23 0730  vancomycin (VANCOREADY) IVPB 1750 mg/350 mL        1,750 mg 175 mL/hr over 120 Minutes Intravenous  Once 03/25/23 0719 03/25/23 1216   03/25/23 0715  vancomycin (VANCOCIN) IVPB 1000 mg/200 mL premix  Status:  Discontinued        1,000 mg 200 mL/hr over 60 Minutes Intravenous Every 12 hours 03/25/23 0712 03/25/23 0717   03/25/23 0715  piperacillin-tazobactam (ZOSYN) IVPB 3.375 g  Status:  Discontinued        3.375 g 100 mL/hr over 30 Minutes Intravenous Every 8 hours 03/25/23 0712 03/25/23 0718   03/25/23 0000  doxycycline (VIBRA-TABS) tablet 100 mg  Status:  Discontinued        100 mg Oral 2  times daily 03/24/23 2346 03/25/23 0835   03/24/23 2030  cefTRIAXone (ROCEPHIN) 2 g in sodium chloride 0.9 % 100 mL IVPB        2 g 200 mL/hr over 30 Minutes Intravenous Once 03/24/23 2016 03/24/23 2131        Assessment/Plan GLF R frontal calvarium fx - pain control. No underlying ICH Facial fxs - ENT Dr. Jearld Fenton has seen - he may need repair but monitor approx 1 week prior to further discussion.   Dr. Dione Booze with ophthalmology has seen the patient - erythromycin ointment for eye and small lac TID per his reccs and right now does not need lateral canthotomy.  Will need close monitoring and follow up in 1-2 weeks outpatient. R hip periprosthetic fx - ortho consulted. Recc WBAT with RW. No active hip abduction. F/u Marchwiany 2-3 wks. therapies Hypotension - unclear etiology.  Echo 3/13 overall WNL. S/p 1 l bolus 3/12. Good UOP. Weaned off of peripheral very low dose levo for 24h. Intermittent low BP but also having hypertension. ?Related to UTI vs RLL consolidation or something else.  Medicine consulted.  Appreciate their assistance. Empiric  vanc/zosyn started given unknown etiology. Urine appears infected. Vanc stopped UTI - Ucx with e coli and klebsiella, susceptibilities pending RLL consolidation/? Aspiration vs PNA - medicine consulted.  Sats are low though at times and required up to 5 lpm with therapies.  Continue O2 (not on o2 at baseline).  Abx therapy.  respiratory culture still not collected due to lack of production. ?DM - hgba1c 5.3 here.  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 He has not had tx in 10 years and doing well. Unsteady gait - frequent falls at home, uses a cane.  Lives by himself. Will need to using RW. therapies Unclear medication usage - doxy an prednisone 40mg  daily on  home med recc initially. Pharmacy has verified meds and home meds now reconciled.  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 Continue inpaitien Anemia-  likely of chronic disease. Monitor. Am CBC pending FEN - dys 3 VTE - lovenox ID - vanc. Zosyn >> Dispo - transfer to progressive. Therapies recc HH. He lives alone but has good support from stepdaughter and grandchild  I reviewed Consultant ortho, TRH, CCM notes, last 24 h vitals and pain scores, last 48 h intake and output, last 24 h labs and trends, and last 24 h imaging results.   LOS: 3 days    Eric Form , [facility] Surgery 03/27/2023, 7:36 AM Please see Amion for pager number during day hours 7:00am-4:30pm or 7:00am -11:30am on weekends   Addendum: Appreciate Ophtho/Ortho/ENT. Up in chair. I&O cath now. Urine CX >100k GNR P ID. Blood CX neg so far. D/C Vanc. May be able to move to progressive this PM if BP stays up.  Violeta Gelinas, MD, MPH, FACS Please use [email or web address] to contact on call provider

## 2023-03-27 NOTE — Progress Notes (Addendum)
 [facility] CONSULT PROGRESS NOTE    Progress Note  [name]  WJX:[id number] DOB: 1950-04-24 DOA: 03/24/2023 PCP: Assunta Found, MD     Brief Narrative:   [name] is an 73 y.o. male past medical history of diabetes mellitus type 2, tonsillar cancer, lung cancer status post chemo and radiation with a PEG tube 10 years ago, chronic urinary retention who self catheterize 3 times a day who was brought in to the ED on 03/24/2023 after a fall at a local bank he tripped and landed on his face laceration about the right eye with elevation.  Minor loss of consciousness, in the ED on admission he was relatively stable CT scan of the head,  CT of the maxillofacial, C-spine showed, multiple extensive facial and orbital fractures hip x-ray showed right hip arthroplasty with minimal cortical irregularity of the proximal femur cortex and a bladder stone.  CT of the chest abdomen pelvis confirmed a nondisplaced periprosthetic fracture involving the intertrochanteric portion of the proximal right femur, some aspiration pneumonia to the right lower lobe and a consolidation in the right posterior lobe.  Requires pressors temporarily now on the morning of 03/26/2023 he is hypertensive and has been weaned off pressors.  No evidence of syncope or prodromal symptoms twelve-lead EKG showed normal sinus rhythm, normal axis no ST segment abnormalities, no events on telemetry.  We were consulted for right lower lobe infiltrates and management of diabetes   Assessment/Plan:   Extensive facial fracture/right frontal calvarial fracture: Secondary to mechanical fall. Further management per trauma surgery ENT and ophthalmology. He relates no prodromal symptoms no loss of consciousness, he relates he thinks he tripped and fell. 2D echo showed preserved EF of 60% aortic valve was not well-visualized some sclerosis no evidence of stenosis, gradient of 8 mmHg. Will continue to follow at a distance.  Right  periprostatic fracture: Ortho was consulted and they relate implant appears stable, may bearing as tolerated with rolling walker. They recommended follow-up with them as an outpatient.  Acute respiratory failure with hypoxia possibly due to aspiration right lower lobe/pneumonia: Continue IV antibiotics which should cover aspiration pneumonia. Can de-escalate to IV Unasyn will defer to trauma. Has remained afebrile leukocytosis has resolved. He is currently on 2 L of oxygen which I think he could be weaned to room air. Incentive spirometry and will try again to room air. Antibiotics that you are using should will need 5 to 7-day course. Blood cultures have been sent.  Hypotension in the ED: He was briefly which required pressors, now has been weaned off pressors and is hypertensive.  Chronic urinary retention/possible UTI: Currently on IV Zosyn should cover. Urine culture growing more than 100,000 colonies of E. coli  Diabetes mellitus type 2: A1c on this admission 5.3. Will go ahead and discontinue all insulins for now. Continue CBGs.  History of metastatic tonsillar cancer/history of cancer: Reports treated with radiation and chemotherapy currently has a PEG tube.  Acute blood loss anemia : with baseline hemoglobin around 14, now around 9.1 Not tachycardic continue to monitor.  Thrombocytopenia: Likely reactive.  Fibromyalgia/depression/anxiety: Resume Celexa and Xanax unable to take orals.   DVT prophylaxis: SCD's Family Communication:none Status is: Inpatient Remains inpatient appropriate because: Acute trauma    Code Status:     Code Status Orders  (From admission, onward)           Start     Ordered   03/24/23 2343  Full code  Continuous  Question:  By:  Answer:  Other   03/24/23 2345           Code Status History     Date Active Date Inactive Code Status Order ID Comments User Context   01/15/2018 1533 01/19/2018 2039 Full Code [id number]   Catarina Hartshorn, MD ED   01/25/2014 1401 01/26/2014 0410 Full Code [id number]  Oley Balm, MD HOV   04/04/2013 1625 04/05/2013 1732 Full Code [id number]  Axel Filler, MD Inpatient   10/18/2012 0157 10/19/2012 1920 Full Code [id number]  Vania Rea, MD Inpatient         IV Access:   Peripheral IV   Procedures and diagnostic studies:   ECHOCARDIOGRAM COMPLETE Result Date: 03/26/2023    ECHOCARDIOGRAM REPORT   Patient Name:   [name] Date of Exam: 03/26/2023 Medical Rec #:  [id number]  Height:       75.0 in Accession #:    [id number] Weight:       177.9 lb Date of Birth:  22-Sep-1950 BSA:          2.088 m Patient Age:    72 years   BP:           132/70 mmHg Patient Gender: M          HR:           87 bpm. Exam Location:  Inpatient Procedure: 2D Echo (Both Spectral and Color Flow Doppler were utilized during            procedure). Indications:    other cardiac sounds  History:        Patient has prior history of Echocardiogram examinations, most                 recent 10/18/2012. Risk Factors:Diabetes and Dyslipidemia.  Sonographer:    Delcie Roch RDCS Referring Phys: [phone] [facility] [name]  Sonographer Comments: Image acquisition challenging due to respiratory motion. IMPRESSIONS  1. Left ventricular ejection fraction, by estimation, is 60 to 65%. The left ventricle has normal function. The left ventricle has no regional wall motion abnormalities. Left ventricular diastolic parameters were normal.  2. Right ventricular systolic function is normal. The right ventricular size is normal.  3. The mitral valve is normal in structure. No evidence of mitral valve regurgitation. No evidence of mitral stenosis.  4. The aortic valve is tricuspid. Aortic valve regurgitation is not visualized. Aortic valve sclerosis/calcification is present, without any evidence of aortic stenosis. Aortic valve area, by VTI measures 1.91 cm. Aortic valve mean gradient measures 8.0 mmHg. Aortic valve Vmax measures 1.88  m/s.  5. The inferior vena cava is normal in size with greater than 50% respiratory variability, suggesting right atrial pressure of 3 mmHg. FINDINGS  Left Ventricle: Left ventricular ejection fraction, by estimation, is 60 to 65%. The left ventricle has normal function. The left ventricle has no regional wall motion abnormalities. The left ventricular internal cavity size was normal in size. There is  no left ventricular hypertrophy. Left ventricular diastolic parameters were normal. Normal left ventricular filling pressure. Right Ventricle: The right ventricular size is normal. No increase in right ventricular wall thickness. Right ventricular systolic function is normal. Left Atrium: Left atrial size was normal in size. Right Atrium: Right atrial size was normal in size. Pericardium: There is no evidence of pericardial effusion. Mitral Valve: The mitral valve is normal in structure. No evidence of mitral valve regurgitation. No evidence of mitral valve stenosis. Tricuspid Valve: The tricuspid valve  is normal in structure. Tricuspid valve regurgitation is not demonstrated. No evidence of tricuspid stenosis. Aortic Valve: The aortic valve is tricuspid. Aortic valve regurgitation is not visualized. Aortic valve sclerosis/calcification is present, without any evidence of aortic stenosis. Aortic valve mean gradient measures 8.0 mmHg. Aortic valve peak gradient measures 14.1 mmHg. Aortic valve area, by VTI measures 1.91 cm. Pulmonic Valve: The pulmonic valve was normal in structure. Pulmonic valve regurgitation is not visualized. No evidence of pulmonic stenosis. Aorta: The aortic root is normal in size and structure. Venous: The inferior vena cava is normal in size with greater than 50% respiratory variability, suggesting right atrial pressure of 3 mmHg. IAS/Shunts: No atrial level shunt detected by color flow Doppler.  LEFT VENTRICLE PLAX 2D LVIDd:         4.80 cm   Diastology LVIDs:         3.30 cm   LV e' medial:     10.90 cm/s LV PW:         1.00 cm   LV E/e' medial:  5.9 LV IVS:        0.90 cm   LV e' lateral:   12.00 cm/s LVOT diam:     1.90 cm   LV E/e' lateral: 5.4 LV SV:         62 LV SV Index:   29 LVOT Area:     2.84 cm  RIGHT VENTRICLE RV Basal diam:  2.80 cm RV S prime:     15.20 cm/s TAPSE (M-mode): 1.8 cm LEFT ATRIUM             Index        RIGHT ATRIUM           Index LA diam:        3.40 cm 1.63 cm/m   RA Area:     12.80 cm LA Vol (A2C):   72.7 ml 34.82 ml/m  RA Volume:   30.00 ml  14.37 ml/m LA Vol (A4C):   39.4 ml 18.87 ml/m LA Biplane Vol: 54.5 ml 26.10 ml/m  AORTIC VALVE AV Area (Vmax):    1.91 cm AV Area (Vmean):   1.90 cm AV Area (VTI):     1.91 cm AV Vmax:           188.00 cm/s AV Vmean:          128.000 cm/s AV VTI:            0.322 m AV Peak Grad:      14.1 mmHg AV Mean Grad:      8.0 mmHg LVOT Vmax:         126.50 cm/s LVOT Vmean:        85.700 cm/s LVOT VTI:          0.217 m LVOT/AV VTI ratio: 0.67  AORTA Ao Root diam: 3.40 cm Ao Asc diam:  3.10 cm MITRAL VALVE MV Area (PHT): 2.73 cm    SHUNTS MV Decel Time: 278 msec    Systemic VTI:  0.22 m MV E velocity: 64.30 cm/s  Systemic Diam: 1.90 cm MV A velocity: 63.60 cm/s MV E/A ratio:  1.01 Armanda Magic MD Electronically signed by Armanda Magic MD Signature Date/Time: 03/26/2023/4:54:23 PM    Final      Medical Consultants:   None.   Subjective:    [name] had a bowel movement no complaints.  Objective:    Vitals:   03/27/23 0400 03/27/23 0500 03/27/23 0600 03/27/23  0801  BP: 129/87 119/66 (!) 167/91   Pulse: 97 94 84   Resp: 15 14 13    Temp:    98.8 F (37.1 C)  TempSrc:    Oral  SpO2: 97% 96% 94%   Weight:   83 kg   Height:       SpO2: 94 % O2 Flow Rate (L/min): 2 L/min   Intake/Output Summary (Last 24 hours) at 03/27/2023 0809 Last data filed at 03/27/2023 0600 Gross per 24 hour  Intake 376.09 ml  Output 2100 ml  Net -1723.91 ml   Filed Weights   03/24/23 1246 03/27/23 0600  Weight: 80.7 kg 83 kg     Exam: General exam: In no acute distress, traumatic face Respiratory system: Good air movement and clear to auscultation. Cardiovascular system: S1 & S2 heard, RRR. No JVD. Gastrointestinal system: Abdomen is nondistended, soft and nontender.  Extremities: No pedal edema. Skin: No rashes, lesions or ulcers Psychiatry: Judgement and insight appear normal. Mood & affect appropriate.  Data Reviewed:    Labs: Basic Metabolic Panel: Recent Labs  Lab 03/24/23 1339 03/24/23 2342 03/25/23 0417 03/27/23 0402  NA 139  --  139 139  K 4.0  --  4.1 3.7  CL 97*  --  107 101  CO2 30  --  26 28  GLUCOSE 127*  --  110* 105*  BUN 29*  --  28* 17  CREATININE 1.22 1.09 1.01 1.04  CALCIUM 9.6  --  7.9* 8.7*   GFR Estimated Creatinine Clearance: 75.4 mL/min (by C-G formula based on SCr of 1.04 mg/dL). Liver Function Tests: Recent Labs  Lab 03/24/23 1339  AST 21  ALT 15  ALKPHOS 89  BILITOT 0.9  PROT 8.0  ALBUMIN 4.3   No results for input(s): "LIPASE", "AMYLASE" in the last 168 hours. No results for input(s): "AMMONIA" in the last 168 hours. Coagulation profile Recent Labs  Lab 03/24/23 2030  INR 1.1   COVID-19 Labs  No results for input(s): "DDIMER", "FERRITIN", "LDH", "CRP" in the last 72 hours.  Lab Results  Component Value Date   SARSCOV2NAA NEGATIVE 03/24/2023    CBC: Recent Labs  Lab 03/24/23 1339 03/24/23 1950 03/24/23 2342 03/25/23 0334 03/26/23 1125  WBC 9.0 15.5* 12.7* 11.5* 8.6  NEUTROABS 8.1*  --   --  10.1*  --   HGB 14.6 12.0* 9.5* 9.1* 10.5*  HCT 44.9 36.5* 32.4* 27.6* 31.6*  MCV 93.5 92.6 103.2* 93.6 91.9  PLT 180 170 119* 119* 122*   Cardiac Enzymes: No results for input(s): "CKTOTAL", "CKMB", "CKMBINDEX", "TROPONINI" in the last 168 hours. BNP (last 3 results) No results for input(s): "PROBNP" in the last 8760 hours. CBG: Recent Labs  Lab 03/26/23 1546 03/26/23 1925 03/26/23 2315 03/27/23 0334 03/27/23 0759  GLUCAP 124* 90 146*  112* 93   D-Dimer: No results for input(s): "DDIMER" in the last 72 hours. Hgb A1c: Recent Labs    03/25/23 0837  HGBA1C 5.3   Lipid Profile: No results for input(s): "CHOL", "HDL", "LDLCALC", "TRIG", "CHOLHDL", "LDLDIRECT" in the last 72 hours. Thyroid function studies: No results for input(s): "TSH", "T4TOTAL", "T3FREE", "THYROIDAB" in the last 72 hours.  Invalid input(s): "FREET3" Anemia work up: No results for input(s): "VITAMINB12", "FOLATE", "FERRITIN", "TIBC", "IRON", "RETICCTPCT" in the last 72 hours. Sepsis Labs: Recent Labs  Lab 03/24/23 1950 03/24/23 2030 03/24/23 2342 03/25/23 0334 03/25/23 1218 03/26/23 1125  WBC 15.5*  --  12.7* 11.5*  --  8.6  LATICACIDVEN  --  1.8  --   --  1.3  --    Microbiology Recent Results (from the past 240 hours)  Resp panel by RT-PCR (RSV, Flu A&B, Covid) Anterior Nasal Swab     Status: None   Collection Time: 03/24/23  3:55 PM   Specimen: Anterior Nasal Swab  Result Value Ref Range Status   SARS Coronavirus 2 by RT PCR NEGATIVE NEGATIVE Final    Comment: (NOTE) SARS-CoV-2 target nucleic acids are NOT DETECTED.  The SARS-CoV-2 RNA is generally detectable in upper respiratory specimens during the acute phase of infection. The lowest concentration of SARS-CoV-2 viral copies this assay can detect is 138 copies/mL. A negative result does not preclude SARS-Cov-2 infection and should not be used as the sole basis for treatment or other patient management decisions. A negative result may occur with  improper specimen collection/handling, submission of specimen other than nasopharyngeal swab, presence of viral mutation(s) within the areas targeted by this assay, and inadequate number of viral copies(<138 copies/mL). A negative result must be combined with clinical observations, patient history, and epidemiological information. The expected result is Negative.  Fact Sheet for Patients:   BloggerCourse.com  Fact Sheet for Healthcare Providers:  SeriousBroker.it  This test is no t yet approved or cleared by the [place] FDA and  has been authorized for detection and/or diagnosis of SARS-CoV-2 by FDA under an Emergency Use Authorization (EUA). This EUA will remain  in effect (meaning this test can be used) for the duration of the COVID-19 declaration under Section 564(b)(1) of the Act, 21 U.S.C.section 360bbb-3(b)(1), unless the authorization is terminated  or revoked sooner.       Influenza A by PCR NEGATIVE NEGATIVE Final   Influenza B by PCR NEGATIVE NEGATIVE Final    Comment: (NOTE) The Xpert Xpress SARS-CoV-2/FLU/RSV plus assay is intended as an aid in the diagnosis of influenza from Nasopharyngeal swab specimens and should not be used as a sole basis for treatment. Nasal washings and aspirates are unacceptable for Xpert Xpress SARS-CoV-2/FLU/RSV testing.  Fact Sheet for Patients: BloggerCourse.com  Fact Sheet for Healthcare Providers: SeriousBroker.it  This test is not yet approved or cleared by the [place] FDA and has been authorized for detection and/or diagnosis of SARS-CoV-2 by FDA under an Emergency Use Authorization (EUA). This EUA will remain in effect (meaning this test can be used) for the duration of the COVID-19 declaration under Section 564(b)(1) of the Act, 21 U.S.C. section 360bbb-3(b)(1), unless the authorization is terminated or revoked.     Resp Syncytial Virus by PCR NEGATIVE NEGATIVE Final    Comment: (NOTE) Fact Sheet for Patients: BloggerCourse.com  Fact Sheet for Healthcare Providers: SeriousBroker.it  This test is not yet approved or cleared by the [place] FDA and has been authorized for detection and/or diagnosis of SARS-CoV-2 by FDA under an Emergency Use  Authorization (EUA). This EUA will remain in effect (meaning this test can be used) for the duration of the COVID-19 declaration under Section 564(b)(1) of the Act, 21 U.S.C. section 360bbb-3(b)(1), unless the authorization is terminated or revoked.  Performed at [facility], [address], [place], [place] [address]   Urine Culture     Status: Abnormal (Preliminary result)   Collection Time: 03/24/23  7:50 PM   Specimen: Urine, Clean Catch  Result Value Ref Range Status   Specimen Description   Final    URINE, CLEAN CATCH Performed at [facility], [address] [place], [place] [address]    Special Requests  Final    NONE Performed at [facility], [address], [place], [place] [address]    Culture (A)  Final    >=100,000 COLONIES/mL ESCHERICHIA COLI 50,000 COLONIES/mL KLEBSIELLA PNEUMONIAE CULTURE REINCUBATED FOR BETTER GROWTH Performed at [facility], [address], [place], [place] [address]    Report Status PENDING  Incomplete  Blood Culture (routine x 2)     Status: None (Preliminary result)   Collection Time: 03/24/23  8:30 PM   Specimen: BLOOD RIGHT FOREARM  Result Value Ref Range Status   Specimen Description   Final    BLOOD RIGHT FOREARM BOTTLES DRAWN AEROBIC AND ANAEROBIC   Special Requests   Final    Blood Culture results may not be optimal due to an inadequate volume of blood received in culture bottles   Culture   Final    NO GROWTH 2 DAYS Performed at [facility], [address], [place], [place] [address]    Report Status PENDING  Incomplete  Blood Culture (routine x 2)     Status: None (Preliminary result)   Collection Time: 03/24/23  8:33 PM   Specimen: BLOOD LEFT HAND  Result Value Ref Range Status   Specimen Description   Final    BLOOD LEFT HAND BOTTLES DRAWN AEROBIC AND ANAEROBIC   Special Requests   Final    Blood Culture results may not be optimal due to an inadequate volume of blood received in culture bottles   Culture   Final     NO GROWTH 2 DAYS Performed at [facility], [address], [place], [place] [address]    Report Status PENDING  Incomplete     Medications:    acetaminophen  1,000 mg Oral Q6H   citalopram  40 mg Oral Daily   docusate sodium  100 mg Oral BID   enoxaparin (LOVENOX) injection  30 mg Subcutaneous Q12H   erythromycin   Right Eye Q8H   methocarbamol  500 mg Oral Q8H   Or   methocarbamol (ROBAXIN) injection  500 mg Intravenous Q8H   OLANZapine  5 mg Oral QHS   polyethylene glycol  17 g Oral BID   rosuvastatin  10 mg Oral QHS   tamsulosin  0.4 mg Oral Daily   Continuous Infusions:  piperacillin-tazobactam (ZOSYN)  IV 3.375 g (03/27/23 0615)      LOS: 3 days   Marinda Elk  [facility]  03/27/2023, 8:09 AM

## 2023-03-27 NOTE — Progress Notes (Signed)
 Pt admitted to rm 3 from [place]. Initiated tele. Oriented pt to the unit. VSS. Call bell within reach.   Lawson Radar, RN

## 2023-03-27 NOTE — Plan of Care (Signed)

## 2023-03-27 NOTE — Progress Notes (Signed)
     Subjective: Patient reports pain in the right hip as mild.  Did well with physical therapy yesterday ambulating 50 feet.  Reports discomfort only when abducting the right leg.  He denies pain in other joints or extremities.  Objective:   VITALS:   Vitals:   03/27/23 0338 03/27/23 0400 03/27/23 0500 03/27/23 0600  BP:  129/87 119/66 (!) 167/91  Pulse:  97 94 84  Resp:  15 14 13   Temp: 99.2 F (37.3 C)     TempSrc: Oral     SpO2:  97% 96% 94%  Weight:    83 kg  Height:        Right hip incision is well-healed, mild swelling about the hip, minimal tenderness to lateral hip No pain with gentle right hip range of motion including flexion extension Sensation intact distally Intact pulses distally Dorsiflexion/Plantar flexion intact Compartment soft   Lab Results  Component Value Date   WBC 8.6 03/26/2023   HGB 10.5 (L) 03/26/2023   HCT 31.6 (L) 03/26/2023   MCV 91.9 03/26/2023   PLT 122 (L) 03/26/2023   BMET    Component Value Date/Time   NA 139 03/27/2023 0402   NA 142 12/16/2013 1305   K 3.7 03/27/2023 0402   K 4.7 12/16/2013 1305   CL 101 03/27/2023 0402   CO2 28 03/27/2023 0402   CO2 32 (H) 12/16/2013 1305   GLUCOSE 105 (H) 03/27/2023 0402   GLUCOSE 86 12/16/2013 1305   BUN 17 03/27/2023 0402   BUN 16.6 12/16/2013 1305   CREATININE 1.04 03/27/2023 0402   CREATININE 0.8 12/16/2013 1305   CALCIUM 8.7 (L) 03/27/2023 0402   CALCIUM 9.9 12/16/2013 1305   EGFR >90 12/16/2013 1305   GFRNONAA >60 03/27/2023 0402     Assessment/Plan:     Principal Problem:   Head injury Active Problems:   Diabetes mellitus, type 2 (HCC)   Protein-calorie malnutrition, severe (HCC)   Critical polytrauma   Aspiration pneumonia of right lower lobe (HCC)   Right lower lobe pulmonary infiltrate   Acute respiratory failure with hypoxia (HCC)   Pressure injury of skin   UTI (urinary tract infection)   Right stable periprosthetic femur fracture  X-rays examination  reassuring that the fracture is stable.  Patient also mobilized well with physical therapy yesterday ambulating 50 feet which also suggest good implant stability.  Anticipate good healing of the fracture with nonoperative treatment.  Recommend weightbearing as tolerated with a walker at all times.  No active abduction.  Will follow-up in my office in 2 to 3 weeks postdischarge.    Derryl Uher A Shawndra Clute 03/27/2023, 7:26 AM   Weber Cooks, MD  Contact information:   [phone] 7am-5pm epic message Dr. Blanchie Dessert, or call office for patient follow up: [phone] After hours and holidays please check [email or web address] for group call information for [facility]

## 2023-03-27 NOTE — TOC Progression Note (Signed)
 Transition of Care [facility]) - Progression Note    Patient Details  Name: [name] MRN: [id number] Date of Birth: Aug 10, 1950  [facility] CM/SW Contact  Glennon Mac, RN Phone Number: 03/27/2023, 4:05 PM  Clinical Narrative:    Patient transferred to progressive unit today; noted down to 2L/[place] O2.  On IV antibiotics for possible PNA; respiratory cx pending.  TOC will follow for home needs:  Will need orders for HHPT, RW and BSC.    Expected Discharge Plan: Home w Home Health Services Barriers to Discharge: Continued Medical Work up  Expected Discharge Plan and Services   Discharge Planning Services: CM Consult   Living arrangements for the past 2 months: Single Family Home                                       Social Determinants of Health (SDOH) Interventions SDOH Screenings   Food Insecurity: No Food Insecurity (03/25/2023)  Housing: Low Risk  (03/25/2023)  Transportation Needs: No Transportation Needs (03/25/2023)  Utilities: Not At Risk (03/25/2023)  Social Connections: Socially Isolated (03/25/2023)  Tobacco Use: Low Risk  (03/24/2023)    Readmission Risk Interventions     No data to display         Quintella Baton, RN, BSN  Trauma/Neuro ICU Case Manager [phone]

## 2023-03-27 NOTE — Progress Notes (Deleted)
 Patient had one more episode of large emesis and several episodes of small emesis with dark green watery fluid. No increase in secretions from ETT. Brown/red in color. KUB/Chest X-ray done Tube feeds held. 2200 Lantus held. Per tube medications held. Abdomen soft, active Flexiseal patent, drained this shift. Levo restarted for labile blood pressures.

## 2023-03-28 LAB — URINE CULTURE: Culture: >=100000 — AB

## 2023-03-28 LAB — GLUCOSE, CAPILLARY
Glucose-Capillary: 98 mg/dL (ref 70–99)
Glucose-Capillary: 108 mg/dL — ABNORMAL HIGH (ref 70–99)
Glucose-Capillary: 126 mg/dL — ABNORMAL HIGH (ref 70–99)
Glucose-Capillary: 204 mg/dL — ABNORMAL HIGH (ref 70–99)

## 2023-03-28 NOTE — Progress Notes (Signed)
 Assessment & Plan: GLF R frontal calvarium fx Facial fxs  - ENT Dr. Jearld Fenton - monitor approx 1 week prior to further discussion.   - Dr. Dione Booze - erythromycin ointment for eye and small lac TID  - follow up in 1-2 weeks outpatient R hip periprosthetic fx  - ortho consult - WBAT with RW. No active hip abduction. F/u [facility] 2-3 wks - PT/OT UTI  RLL consolidation/? Aspiration vs PNA  ?DM H/O metastatic tonsillar cancer Unsteady gait  - frequent falls at home, uses a cane.  Lives by himself. 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 Zosyn >> Dispo - Therapies recc HH. He lives alone but has good support from stepdaughter and grandchild        Darnell Level, MD [facility] practice Office: [phone]        Chief Complaint: Fall on level ground  Subjective: Patient awake, responsive.  No complaints - "on vacation"!  Objective: Vital signs in last 24 hours: Temp:  [97.6 F (36.4 C)-98.8 F (37.1 C)] 97.6 F (36.4 C) (03/15 0808) Pulse Rate:  [68-111] 92 (03/15 0808) Resp:  [10-21] 16 (03/15 0808) BP: (106-153)/(72-91) 153/77 (03/15 0808) SpO2:  [93 %-100 %] 98 % (03/15 0808) Last BM Date : 03/23/23 (PTA)  Intake/Output from previous day: 03/14 0701 - 03/15 0700 In: 122.5 [IV Piggyback:122.5] Out: 1865 [Urine:1865] Intake/Output this shift: No intake/output data recorded.  Physical Exam: HEENT - soft tissue swelling, ecchymosis right peri-orbital; vision intact Abdomen - soft without distension Ext - no edema, non-tender  Lab Results:  Recent Labs    03/26/23 1125 03/27/23 0855  WBC 8.6 9.4  HGB 10.5* 10.3*  HCT 31.6* 31.3*  PLT 122* 118*   BMET Recent Labs    03/27/23 0402  NA 139  K 3.7  CL 101  CO2 28  GLUCOSE 105*  BUN 17  CREATININE 1.04  CALCIUM 8.7*   PT/INR No results for input(s): "LABPROT", "INR" in the  last 72 hours. Comprehensive Metabolic Panel:    Component Value Date/Time   NA 139 03/27/2023 0402   NA 139 03/25/2023 0417   NA 142 12/16/2013 1305   NA 143 10/18/2013 0938   K 3.7 03/27/2023 0402   K 4.1 03/25/2023 0417   K 4.7 12/16/2013 1305   K 3.9 10/18/2013 0938   CL 101 03/27/2023 0402   CL 107 03/25/2023 0417   CO2 28 03/27/2023 0402   CO2 26 03/25/2023 0417   CO2 32 (H) 12/16/2013 1305   CO2 29 10/18/2013 0938   BUN 17 03/27/2023 0402   BUN 28 (H) 03/25/2023 0417   BUN 16.6 12/16/2013 1305   BUN 11.5 10/18/2013 0938   CREATININE 1.04 03/27/2023 0402   CREATININE 1.01 03/25/2023 0417   CREATININE 0.8 12/16/2013 1305   CREATININE 0.7 10/18/2013 0938   GLUCOSE 105 (H) 03/27/2023 0402   GLUCOSE 110 (H) 03/25/2023 0417   GLUCOSE 86 12/16/2013 1305   GLUCOSE 98 10/18/2013 0938   CALCIUM 8.7 (L) 03/27/2023 0402   CALCIUM 7.9 (L) 03/25/2023 0417   CALCIUM 9.9 12/16/2013 1305   CALCIUM 9.7 10/18/2013 0938   AST 21 03/24/2023 1339   AST 20 03/18/2023 1723   AST 20 12/16/2013 1305   AST 18 10/18/2013 0938   ALT 15 03/24/2023 1339   ALT 14 03/18/2023  1723   ALT 13 12/16/2013 1305   ALT 10 10/18/2013 0938   ALKPHOS 89 03/24/2023 1339   ALKPHOS 74 03/18/2023 1723   ALKPHOS 84 12/16/2013 1305   ALKPHOS 81 10/18/2013 0938   BILITOT 0.9 03/24/2023 1339   BILITOT 0.7 03/18/2023 1723   BILITOT 0.45 12/16/2013 1305   BILITOT 0.50 10/18/2013 0938   PROT 8.0 03/24/2023 1339   PROT 7.7 03/18/2023 1723   PROT 8.0 12/16/2013 1305   PROT 8.0 10/18/2013 0938   ALBUMIN 4.3 03/24/2023 1339   ALBUMIN 4.2 03/18/2023 1723   ALBUMIN 4.0 12/16/2013 1305   ALBUMIN 3.6 10/18/2013 6387    Studies/Results: ECHOCARDIOGRAM COMPLETE Result Date: 03/26/2023    ECHOCARDIOGRAM REPORT   Patient Name:   [name] Andrew Date of Exam: 03/26/2023 Medical Rec #:  [id number]  Height:       75.0 in Accession #:    [id number] Weight:       177.9 lb Date of Birth:  1950/03/18 BSA:          2.088 m  Patient Age:    73 years   BP:           132/70 mmHg Patient Gender: M          HR:           87 bpm. Exam Location:  Inpatient Procedure: 2D Echo (Both Spectral and Color Flow Doppler were utilized during            procedure). Indications:    other cardiac sounds  History:        Patient has prior history of Echocardiogram examinations, most                 recent 10/18/2012. Risk Factors:Diabetes and Dyslipidemia.  Sonographer:    Delcie Roch RDCS Referring Phys: [phone] [name]  Sonographer Comments: Image acquisition challenging due to respiratory motion. IMPRESSIONS  1. Left ventricular ejection fraction, by estimation, is 60 to 65%. The left ventricle has normal function. The left ventricle has no regional wall motion abnormalities. Left ventricular diastolic parameters were normal.  2. Right ventricular systolic function is normal. The right ventricular size is normal.  3. The mitral valve is normal in structure. No evidence of mitral valve regurgitation. No evidence of mitral stenosis.  4. The aortic valve is tricuspid. Aortic valve regurgitation is not visualized. Aortic valve sclerosis/calcification is present, without any evidence of aortic stenosis. Aortic valve area, by VTI measures 1.91 cm. Aortic valve mean gradient measures 8.0 mmHg. Aortic valve Vmax measures 1.88 m/s.  5. The inferior vena cava is normal in size with greater than 50% respiratory variability, suggesting right atrial pressure of 3 mmHg. FINDINGS  Left Ventricle: Left ventricular ejection fraction, by estimation, is 60 to 65%. The left ventricle has normal function. The left ventricle has no regional wall motion abnormalities. The left ventricular internal cavity size was normal in size. There is  no left ventricular hypertrophy. Left ventricular diastolic parameters were normal. Normal left ventricular filling pressure. Right Ventricle: The right ventricular size is normal. No increase in right ventricular wall  thickness. Right ventricular systolic function is normal. Left Atrium: Left atrial size was normal in size. Right Atrium: Right atrial size was normal in size. Pericardium: There is no evidence of pericardial effusion. Mitral Valve: The mitral valve is normal in structure. No evidence of mitral valve regurgitation. No evidence of mitral valve stenosis. Tricuspid Valve: The tricuspid valve is normal in structure.  Tricuspid valve regurgitation is not demonstrated. No evidence of tricuspid stenosis. Aortic Valve: The aortic valve is tricuspid. Aortic valve regurgitation is not visualized. Aortic valve sclerosis/calcification is present, without any evidence of aortic stenosis. Aortic valve mean gradient measures 8.0 mmHg. Aortic valve peak gradient measures 14.1 mmHg. Aortic valve area, by VTI measures 1.91 cm. Pulmonic Valve: The pulmonic valve was normal in structure. Pulmonic valve regurgitation is not visualized. No evidence of pulmonic stenosis. Aorta: The aortic root is normal in size and structure. Venous: The inferior vena cava is normal in size with greater than 50% respiratory variability, suggesting right atrial pressure of 3 mmHg. IAS/Shunts: No atrial level shunt detected by color flow Doppler.  LEFT VENTRICLE PLAX 2D LVIDd:         4.80 cm   Diastology LVIDs:         3.30 cm   LV e' medial:    10.90 cm/s LV PW:         1.00 cm   LV E/e' medial:  5.9 LV IVS:        0.90 cm   LV e' lateral:   12.00 cm/s LVOT diam:     1.90 cm   LV E/e' lateral: 5.4 LV SV:         62 LV SV Index:   29 LVOT Area:     2.84 cm  RIGHT VENTRICLE RV Basal diam:  2.80 cm RV S prime:     15.20 cm/s TAPSE (M-mode): 1.8 cm LEFT ATRIUM             Index        RIGHT ATRIUM           Index LA diam:        3.40 cm 1.63 cm/m   RA Area:     12.80 cm LA Vol (A2C):   72.7 ml 34.82 ml/m  RA Volume:   30.00 ml  14.37 ml/m LA Vol (A4C):   39.4 ml 18.87 ml/m LA Biplane Vol: 54.5 ml 26.10 ml/m  AORTIC VALVE AV Area (Vmax):    1.91 cm AV  Area (Vmean):   1.90 cm AV Area (VTI):     1.91 cm AV Vmax:           188.00 cm/s AV Vmean:          128.000 cm/s AV VTI:            0.322 m AV Peak Grad:      14.1 mmHg AV Mean Grad:      8.0 mmHg LVOT Vmax:         126.50 cm/s LVOT Vmean:        85.700 cm/s LVOT VTI:          0.217 m LVOT/AV VTI ratio: 0.67  AORTA Ao Root diam: 3.40 cm Ao Asc diam:  3.10 cm MITRAL VALVE MV Area (PHT): 2.73 cm    SHUNTS MV Decel Time: 278 msec    Systemic VTI:  0.22 m MV E velocity: 64.30 cm/s  Systemic Diam: 1.90 cm MV A velocity: 63.60 cm/s MV E/A ratio:  1.01 Armanda Magic MD Electronically signed by Armanda Magic MD Signature Date/Time: 03/26/2023/4:54:23 PM    Final       Darnell Level 03/28/2023  Patient ID: [name], male   DOB: [birth year], 73 y.o.   MRN: [id number]

## 2023-03-28 NOTE — Progress Notes (Signed)
 [id number] RN completed bladder scan of 336 mL. PT refused In and Out cath at this time. Pt stated he would like to wait a little longer as we did an I/O cath at [facility]

## 2023-03-28 NOTE — Plan of Care (Signed)

## 2023-03-28 NOTE — Progress Notes (Signed)
 Updated pt grandson, [name], with verbal consent from patient.  Grandson wanted it noted that pt rarely has the urge to pee, so to encourage pt to have frequent bladder scans to assess.

## 2023-03-29 DIAGNOSIS — S0990XA Unspecified injury of head, initial encounter: Secondary | ICD-10-CM | POA: Diagnosis not present

## 2023-03-29 LAB — CULTURE, BLOOD (ROUTINE X 2)
Culture: NO GROWTH
Culture: NO GROWTH

## 2023-03-29 LAB — GLUCOSE, CAPILLARY
Glucose-Capillary: 111 mg/dL — ABNORMAL HIGH (ref 70–99)
Glucose-Capillary: 116 mg/dL — ABNORMAL HIGH (ref 70–99)
Glucose-Capillary: 202 mg/dL — ABNORMAL HIGH (ref 70–99)

## 2023-03-29 MED ORDER — INSULIN ASPART 100 UNIT/ML IJ SOLN
0.0000 [IU] | Freq: Three times a day (TID) | INTRAMUSCULAR | Status: DC
  Administered 2023-03-29: 3 [IU] via SUBCUTANEOUS

## 2023-03-29 MED ORDER — INSULIN ASPART 100 UNIT/ML IJ SOLN
0.0000 [IU] | Freq: Every day | INTRAMUSCULAR | Status: DC
  Administered 2023-03-29: 2 [IU] via SUBCUTANEOUS

## 2023-03-29 MED ORDER — INSULIN ASPART 100 UNIT/ML IJ SOLN
3.0000 [IU] | Freq: Three times a day (TID) | INTRAMUSCULAR | Status: DC
  Administered 2023-03-29 – 2023-03-30 (×3): 3 [IU] via SUBCUTANEOUS

## 2023-03-29 NOTE — Plan of Care (Signed)

## 2023-03-29 NOTE — Progress Notes (Signed)
 [facility] CONSULT PROGRESS NOTE    Progress Note  [name]  QIO:[id number] DOB: [birth year] DOA: 03/24/2023 PCP: Assunta Found, MD     Brief Narrative:   [name] is an 73 y.o. male past medical history of diabetes mellitus type 2, tonsillar cancer, lung cancer status post chemo and radiation with a PEG tube 10 years ago, chronic urinary retention who self catheterize 3 times a day who was brought in to the ED on 03/24/2023 after a fall at a local bank he tripped and landed on his face laceration about the right eye with elevation.  Minor loss of consciousness, in the ED on admission he was relatively stable CT scan of the head,  CT of the maxillofacial, C-spine showed, multiple extensive facial and orbital fractures hip x-ray showed right hip arthroplasty with minimal cortical irregularity of the proximal femur cortex and a bladder stone.  CT of the chest abdomen pelvis confirmed a nondisplaced periprosthetic fracture involving the intertrochanteric portion of the proximal right femur, some aspiration pneumonia to the right lower lobe and a consolidation in the right posterior lobe.  Requires pressors temporarily now on the morning of 03/26/2023 he is hypertensive and has been weaned off pressors.  No evidence of syncope or prodromal symptoms twelve-lead EKG showed normal sinus rhythm, normal axis no ST segment abnormalities, no events on telemetry.  We were consulted for right lower lobe infiltrates and management of diabetes   Assessment/Plan:   Extensive facial fracture/right frontal calvarial fracture: Further management per trauma surgery ENT and ophthalmology. He relates no prodromal symptoms no loss of consciousness, he relates he thinks he tripped and fell. 2D echo showed preserved EF of 60% aortic valve was not well-visualized some sclerosis no evidence of stenosis, gradient of 8 mmHg. Will continue to follow at a distance.  Right periprostatic fracture: Ortho was  consulted and they relate implant appears stable, may bearing as tolerated with rolling walker. They recommended follow-up with them as an outpatient.  Acute respiratory failure with hypoxia possibly due to aspiration right lower lobe/pneumonia: Continue IV antibiotics which should cover aspiration pneumonia. Now on IV Rocephin has defervesced leukocytosis improved. On 1 L of oxygen, out of bed to chair multiple. Needs a 5-day course of antibiotics. Blood cultures have been sent. OOB.   Hypotension in the ED: He was briefly which required pressors, now has been weaned off pressors and is hypertensive.  Chronic urinary retention/possible UTI: Currently on IV Rocephin Urine culture growing more than 100,000 colonies of E. coli sensitive to Rocephin completed 5-day course.  Diabetes mellitus type 2: A1c on this admission 5.3. Blood glucose trending up, start sliding scale insulin.  History of metastatic tonsillar cancer/history of cancer: Reports treated with radiation and chemotherapy currently has a no PEG tube.  Acute blood loss anemia : with baseline hemoglobin around 14, now around 9.1 Not tachycardic continue to monitor.  Thrombocytopenia: Likely reactive.  Fibromyalgia/depression/anxiety: Resume Celexa and Xanax once able to take orals.   DVT prophylaxis: SCD's Family Communication:none Status is: Inpatient Remains inpatient appropriate because: Acute trauma    Code Status:     Code Status Orders  (From admission, onward)           Start     Ordered   03/24/23 2343  Full code  Continuous       Question:  By:  Answer:  Other   03/24/23 2345           Code Status History  Date Active Date Inactive Code Status Order ID Comments User Context   01/15/2018 1533 01/19/2018 2039 Full Code [id number]  Catarina Hartshorn, MD ED   01/25/2014 1401 01/26/2014 0410 Full Code [id number]  Oley Balm, MD HOV   04/04/2013 1625 04/05/2013 1732 Full Code [id number]  Axel Filler, MD Inpatient   10/18/2012 0157 10/19/2012 1920 Full Code [id number]  Vania Rea, MD Inpatient         IV Access:   Peripheral IV   Procedures and diagnostic studies:   No results found.    Medical Consultants:   None.   Subjective:    [name] pain is controlled.  Objective:    Vitals:   03/28/23 1649 03/28/23 1938 03/28/23 2320 03/29/23 0752  BP: 130/81 (!) 149/77  111/73  Pulse: 72 85 67   Resp: 18 17  16   Temp: 98 F (36.7 C) 98.1 F (36.7 C)  98.2 F (36.8 C)  TempSrc: Oral Oral Oral Oral  SpO2: 99% 93% 95% 96%  Weight:      Height:       SpO2: 96 % O2 Flow Rate (L/min): 1 L/min   Intake/Output Summary (Last 24 hours) at 03/29/2023 0827 Last data filed at 03/29/2023 0230 Gross per 24 hour  Intake 480 ml  Output 1500 ml  Net -1020 ml   Filed Weights   03/24/23 1246 03/27/23 0600  Weight: 80.7 kg 83 kg    Exam: General exam: In no acute distress. Respiratory system: Good air movement and clear to auscultation. Cardiovascular system: S1 & S2 heard, RRR. No JVD. Gastrointestinal system: Abdomen is nondistended, soft and nontender.  Extremities: No pedal edema. Skin: No rashes, lesions or ulcers Psychiatry: Judgement and insight appear normal. Mood & affect appropriate.  Data Reviewed:    Labs: Basic Metabolic Panel: Recent Labs  Lab 03/24/23 1339 03/24/23 2342 03/25/23 0417 03/27/23 0402  NA 139  --  139 139  K 4.0  --  4.1 3.7  CL 97*  --  107 101  CO2 30  --  26 28  GLUCOSE 127*  --  110* 105*  BUN 29*  --  28* 17  CREATININE 1.22 1.09 1.01 1.04  CALCIUM 9.6  --  7.9* 8.7*   GFR Estimated Creatinine Clearance: 75.4 mL/min (by C-G formula based on SCr of 1.04 mg/dL). Liver Function Tests: Recent Labs  Lab 03/24/23 1339  AST 21  ALT 15  ALKPHOS 89  BILITOT 0.9  PROT 8.0  ALBUMIN 4.3   No results for input(s): "LIPASE", "AMYLASE" in the last 168 hours. No results for input(s): "AMMONIA" in the last 168  hours. Coagulation profile Recent Labs  Lab 03/24/23 2030  INR 1.1   COVID-19 Labs  No results for input(s): "DDIMER", "FERRITIN", "LDH", "CRP" in the last 72 hours.  Lab Results  Component Value Date   SARSCOV2NAA NEGATIVE 03/24/2023    CBC: Recent Labs  Lab 03/24/23 1339 03/24/23 1950 03/24/23 2342 03/25/23 0334 03/26/23 1125 03/27/23 0855  WBC 9.0 15.5* 12.7* 11.5* 8.6 9.4  NEUTROABS 8.1*  --   --  10.1*  --   --   HGB 14.6 12.0* 9.5* 9.1* 10.5* 10.3*  HCT 44.9 36.5* 32.4* 27.6* 31.6* 31.3*  MCV 93.5 92.6 103.2* 93.6 91.9 92.3  PLT 180 170 119* 119* 122* 118*   Cardiac Enzymes: No results for input(s): "CKTOTAL", "CKMB", "CKMBINDEX", "TROPONINI" in the last 168 hours. BNP (last 3 results) No results for input(s): "PROBNP" in the  last 8760 hours. CBG: Recent Labs  Lab 03/27/23 2109 03/28/23 0806 03/28/23 1151 03/28/23 1647 03/28/23 2104  GLUCAP 136* 98 108* 126* 204*   D-Dimer: No results for input(s): "DDIMER" in the last 72 hours. Hgb A1c: No results for input(s): "HGBA1C" in the last 72 hours.  Lipid Profile: No results for input(s): "CHOL", "HDL", "LDLCALC", "TRIG", "CHOLHDL", "LDLDIRECT" in the last 72 hours. Thyroid function studies: No results for input(s): "TSH", "T4TOTAL", "T3FREE", "THYROIDAB" in the last 72 hours.  Invalid input(s): "FREET3" Anemia work up: No results for input(s): "VITAMINB12", "FOLATE", "FERRITIN", "TIBC", "IRON", "RETICCTPCT" in the last 72 hours. Sepsis Labs: Recent Labs  Lab 03/24/23 2030 03/24/23 2342 03/25/23 0334 03/25/23 1218 03/26/23 1125 03/27/23 0855  WBC  --  12.7* 11.5*  --  8.6 9.4  LATICACIDVEN 1.8  --   --  1.3  --   --    Microbiology Recent Results (from the past 240 hours)  Resp panel by RT-PCR (RSV, Flu A&B, Covid) Anterior Nasal Swab     Status: None   Collection Time: 03/24/23  3:55 PM   Specimen: Anterior Nasal Swab  Result Value Ref Range Status   SARS Coronavirus 2 by RT PCR NEGATIVE  NEGATIVE Final    Comment: (NOTE) SARS-CoV-2 target nucleic acids are NOT DETECTED.  The SARS-CoV-2 RNA is generally detectable in upper respiratory specimens during the acute phase of infection. The lowest concentration of SARS-CoV-2 viral copies this assay can detect is 138 copies/mL. A negative result does not preclude SARS-Cov-2 infection and should not be used as the sole basis for treatment or other patient management decisions. A negative result may occur with  improper specimen collection/handling, submission of specimen other than nasopharyngeal swab, presence of viral mutation(s) within the areas targeted by this assay, and inadequate number of viral copies(<138 copies/mL). A negative result must be combined with clinical observations, patient history, and epidemiological information. The expected result is Negative.  Fact Sheet for Patients:  BloggerCourse.com  Fact Sheet for Healthcare Providers:  SeriousBroker.it  This test is no t yet approved or cleared by the [place] FDA and  has been authorized for detection and/or diagnosis of SARS-CoV-2 by FDA under an Emergency Use Authorization (EUA). This EUA will remain  in effect (meaning this test can be used) for the duration of the COVID-19 declaration under Section 564(b)(1) of the Act, 21 U.S.C.section 360bbb-3(b)(1), unless the authorization is terminated  or revoked sooner.       Influenza A by PCR NEGATIVE NEGATIVE Final   Influenza B by PCR NEGATIVE NEGATIVE Final    Comment: (NOTE) The Xpert Xpress SARS-CoV-2/FLU/RSV plus assay is intended as an aid in the diagnosis of influenza from Nasopharyngeal swab specimens and should not be used as a sole basis for treatment. Nasal washings and aspirates are unacceptable for Xpert Xpress SARS-CoV-2/FLU/RSV testing.  Fact Sheet for Patients: BloggerCourse.com  Fact Sheet for Healthcare  Providers: SeriousBroker.it  This test is not yet approved or cleared by the [place] FDA and has been authorized for detection and/or diagnosis of SARS-CoV-2 by FDA under an Emergency Use Authorization (EUA). This EUA will remain in effect (meaning this test can be used) for the duration of the COVID-19 declaration under Section 564(b)(1) of the Act, 21 U.S.C. section 360bbb-3(b)(1), unless the authorization is terminated or revoked.     Resp Syncytial Virus by PCR NEGATIVE NEGATIVE Final    Comment: (NOTE) Fact Sheet for Patients: BloggerCourse.com  Fact Sheet for Healthcare Providers: SeriousBroker.it  This test is not yet approved or cleared by the [place] and has been authorized for detection and/or diagnosis of SARS-CoV-2 by FDA under an Emergency Use Authorization (EUA). This EUA will remain in effect (meaning this test can be used) for the duration of the COVID-19 declaration under Section 564(b)(1) of the Act, 21 U.S.C. section 360bbb-3(b)(1), unless the authorization is terminated or revoked.  Performed at [facility], [address] [place], [place] [address]   Urine Culture     Status: Abnormal   Collection Time: 03/24/23  7:50 PM   Specimen: Urine, Clean Catch  Result Value Ref Range Status   Specimen Description   Final    URINE, CLEAN CATCH Performed at [facility], [address], [place], [place] [address]    Special Requests   Final    NONE Performed at [facility], [address] [place], [place] [address]    Culture (A)  Final    >=100,000 COLONIES/mL ESCHERICHIA COLI 50,000 COLONIES/mL KLEBSIELLA PNEUMONIAE    Report Status 03/28/2023 FINAL  Final   Organism ID, Bacteria ESCHERICHIA COLI (A)  Final   Organism ID, Bacteria KLEBSIELLA PNEUMONIAE (A)  Final      Susceptibility   Escherichia coli - MIC*    AMPICILLIN >=32 RESISTANT Resistant     CEFAZOLIN <=4  SENSITIVE Sensitive     CEFEPIME <=0.12 SENSITIVE Sensitive     CEFTRIAXONE <=0.25 SENSITIVE Sensitive     CIPROFLOXACIN <=0.25 SENSITIVE Sensitive     GENTAMICIN >=16 RESISTANT Resistant     IMIPENEM <=0.25 SENSITIVE Sensitive     NITROFURANTOIN <=16 SENSITIVE Sensitive     TRIMETH/SULFA <=20 SENSITIVE Sensitive     AMPICILLIN/SULBACTAM 16 INTERMEDIATE Intermediate     PIP/TAZO <=4 SENSITIVE Sensitive ug/mL    * >=100,000 COLONIES/mL ESCHERICHIA COLI   Klebsiella pneumoniae - MIC*    AMPICILLIN RESISTANT Resistant     CEFAZOLIN <=4 SENSITIVE Sensitive     CEFEPIME <=0.12 SENSITIVE Sensitive     CEFTRIAXONE <=0.25 SENSITIVE Sensitive     CIPROFLOXACIN <=0.25 SENSITIVE Sensitive     GENTAMICIN <=1 SENSITIVE Sensitive     IMIPENEM <=0.25 SENSITIVE Sensitive     NITROFURANTOIN <=16 SENSITIVE Sensitive     TRIMETH/SULFA <=20 SENSITIVE Sensitive     AMPICILLIN/SULBACTAM 4 SENSITIVE Sensitive     PIP/TAZO <=4 SENSITIVE Sensitive ug/mL    * 50,000 COLONIES/mL KLEBSIELLA PNEUMONIAE  Blood Culture (routine x 2)     Status: None   Collection Time: 03/24/23  8:30 PM   Specimen: BLOOD RIGHT FOREARM  Result Value Ref Range Status   Specimen Description   Final    BLOOD RIGHT FOREARM BOTTLES DRAWN AEROBIC AND ANAEROBIC   Special Requests   Final    Blood Culture results may not be optimal due to an inadequate volume of blood received in culture bottles   Culture   Final    NO GROWTH 5 DAYS Performed at [facility], [address] [place], [place] [address]    Report Status 03/29/2023 FINAL  Final  Blood Culture (routine x 2)     Status: None   Collection Time: 03/24/23  8:33 PM   Specimen: BLOOD LEFT HAND  Result Value Ref Range Status   Specimen Description   Final    BLOOD LEFT HAND BOTTLES DRAWN AEROBIC AND ANAEROBIC   Special Requests   Final    Blood Culture results may not be optimal due to an inadequate volume of blood received in  culture bottles   Culture   Final    NO GROWTH  5 DAYS Performed at [facility], [address] [place], [place] [address]    Report Status 03/29/2023 FINAL  Final     Medications:    acetaminophen  1,000 mg Oral Q6H   citalopram  40 mg Oral Daily   docusate sodium  100 mg Oral BID   enoxaparin (LOVENOX) injection  30 mg Subcutaneous Q12H   erythromycin   Right Eye Q8H   OLANZapine  5 mg Oral QHS   rosuvastatin  10 mg Oral QHS   tamsulosin  0.4 mg Oral Daily   Continuous Infusions:  cefTRIAXone (ROCEPHIN)  IV 2 g (03/28/23 1418)      LOS: 5 days   Marinda Elk  [facility]  03/29/2023, 8:27 AM

## 2023-03-29 NOTE — Progress Notes (Signed)
 Assessment & Plan: GLF R frontal calvarium fx Facial fxs  - ENT Dr. Jearld Fenton - monitor approx 1 week prior to further discussion.   - Dr. Dione Booze - erythromycin ointment for eye and small lac TID  - follow up in 1-2 weeks outpatient R hip periprosthetic fx  - ortho consult - WBAT with RW. No active hip abduction. F/u Marchwiany 2-3 wks - PT/OT UTI  RLL consolidation/? Aspiration vs PNA  - TRH following ?DM H/O metastatic tonsillar cancer Unsteady gait  - frequent falls at home, uses a cane.  Lives by himself. 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 Zosyn >> Dispo - Therapies rec HH. He lives, good support from stepdaughter and grandchild.        Darnell Level, MD [facility] practice Office: [phone]        Chief Complaint: Fall  Subjective: Patient in bed, mild pain right hip.  Otherwise no complaints.  Objective: Vital signs in last 24 hours: Temp:  [97.9 F (36.6 C)-98.2 F (36.8 C)] 98.2 F (36.8 C) (03/16 0752) Pulse Rate:  [67-85] 67 (03/15 2320) Resp:  [16-18] 16 (03/16 0752) BP: (111-164)/(73-91) 111/73 (03/16 0752) SpO2:  [93 %-99 %] 96 % (03/16 0752) Last BM Date : 03/28/23  Intake/Output from previous day: 03/15 0701 - 03/16 0700 In: 480 [P.O.:480] Out: 1500 [Urine:1500] Intake/Output this shift: Total I/O In: -  Out: 500 [Urine:500]  Physical Exam: HEENT - ecchymosis, edema right peri-orbital improved, vision intact Neck - soft Abdomen - soft, non-tender  Lab Results:  Recent Labs    03/26/23 1125 03/27/23 0855  WBC 8.6 9.4  HGB 10.5* 10.3*  HCT 31.6* 31.3*  PLT 122* 118*   BMET Recent Labs    03/27/23 0402  NA 139  K 3.7  CL 101  CO2 28  GLUCOSE 105*  BUN 17  CREATININE 1.04  CALCIUM 8.7*   PT/INR No results for input(s): "LABPROT", "INR" in the last 72 hours. Comprehensive Metabolic  Panel:    Component Value Date/Time   NA 139 03/27/2023 0402   NA 139 03/25/2023 0417   NA 142 12/16/2013 1305   NA 143 10/18/2013 0938   K 3.7 03/27/2023 0402   K 4.1 03/25/2023 0417   K 4.7 12/16/2013 1305   K 3.9 10/18/2013 0938   CL 101 03/27/2023 0402   CL 107 03/25/2023 0417   CO2 28 03/27/2023 0402   CO2 26 03/25/2023 0417   CO2 32 (H) 12/16/2013 1305   CO2 29 10/18/2013 0938   BUN 17 03/27/2023 0402   BUN 28 (H) 03/25/2023 0417   BUN 16.6 12/16/2013 1305   BUN 11.5 10/18/2013 0938   CREATININE 1.04 03/27/2023 0402   CREATININE 1.01 03/25/2023 0417   CREATININE 0.8 12/16/2013 1305   CREATININE 0.7 10/18/2013 0938   GLUCOSE 105 (H) 03/27/2023 0402   GLUCOSE 110 (H) 03/25/2023 0417   GLUCOSE 86 12/16/2013 1305   GLUCOSE 98 10/18/2013 0938   CALCIUM 8.7 (L) 03/27/2023 0402   CALCIUM 7.9 (L) 03/25/2023 0417   CALCIUM 9.9 12/16/2013 1305   CALCIUM 9.7 10/18/2013 0938   AST 21 03/24/2023 1339   AST 20 03/18/2023 1723   AST 20 12/16/2013 1305   AST 18 10/18/2013 0938   ALT 15 03/24/2023 1339   ALT 14 03/18/2023 1723  ALT 13 12/16/2013 1305   ALT 10 10/18/2013 0938   ALKPHOS 89 03/24/2023 1339   ALKPHOS 74 03/18/2023 1723   ALKPHOS 84 12/16/2013 1305   ALKPHOS 81 10/18/2013 0938   BILITOT 0.9 03/24/2023 1339   BILITOT 0.7 03/18/2023 1723   BILITOT 0.45 12/16/2013 1305   BILITOT 0.50 10/18/2013 0938   PROT 8.0 03/24/2023 1339   PROT 7.7 03/18/2023 1723   PROT 8.0 12/16/2013 1305   PROT 8.0 10/18/2013 0938   ALBUMIN 4.3 03/24/2023 1339   ALBUMIN 4.2 03/18/2023 1723   ALBUMIN 4.0 12/16/2013 1305   ALBUMIN 3.6 10/18/2013 0938    Studies/Results: No results found.    Darnell Level 03/29/2023  Patient ID: [name], male   DOB: 02-22-1950, 73 y.o.   MRN: [id number]

## 2023-03-30 LAB — GLUCOSE, CAPILLARY
Glucose-Capillary: 91 mg/dL (ref 70–99)
Glucose-Capillary: 105 mg/dL — ABNORMAL HIGH (ref 70–99)

## 2023-03-30 MED ORDER — ACETAMINOPHEN 500 MG PO TABS: 1000.0000 mg | ORAL_TABLET | Freq: Four times a day (QID) | ORAL | Status: AC | PRN

## 2023-03-30 MED ORDER — ERYTHROMYCIN 5 MG/GM OP OINT: TOPICAL_OINTMENT | Freq: Three times a day (TID) | OPHTHALMIC | 0 refills | Status: AC

## 2023-03-30 MED ORDER — TRAMADOL HCL 50 MG PO TABS
50.0000 mg | ORAL_TABLET | Freq: Four times a day (QID) | ORAL | 0 refills | Status: DC | PRN
Start: 2023-03-30 — End: 2023-05-18

## 2023-03-30 NOTE — Progress Notes (Addendum)
 Patient ID: [name], male   DOB: 04-05-1950, 73 y.o.   MRN: [id number]      Subjective: Up in chair, doing better, just worked with therapies ROS negative except as listed above. Objective: Vital signs in last 24 hours: Temp:  [98 F (36.7 C)-98.8 F (37.1 C)] 98.2 F (36.8 C) (03/17 0826) Pulse Rate:  [75-87] 77 (03/17 0826) Resp:  [16-19] 19 (03/17 0322) BP: (90-140)/(59-94) 122/88 (03/17 0826) SpO2:  [90 %-99 %] 90 % (03/17 0826) Last BM Date : 03/29/23  Intake/Output from previous day: 03/16 0701 - 03/17 0700 In: 720 [P.O.:720] Out: 1300 [Urine:1300] Intake/Output this shift: Total I/O In: -  Out: 300 [Urine:300]  General appearance: alert and cooperative Head: facial ecchymoses evolving Resp: clear to auscultation bilaterally GI: soft, NT, ND Extremities: calves soft, mild tenderness R hip   Lab Results: CBC  No results for input(s): "WBC", "HGB", "HCT", "PLT" in the last 72 hours. BMET No results for input(s): "NA", "K", "CL", "CO2", "GLUCOSE", "BUN", "CREATININE", "CALCIUM" in the last 72 hours. PT/INR No results for input(s): "LABPROT", "INR" in the last 72 hours. ABG No results for input(s): "PHART", "HCO3" in the last 72 hours.  Invalid input(s): "PCO2", "PO2"  Studies/Results: No results found.  Anti-infectives: Anti-infectives (From admission, onward)    Start     Dose/Rate Route Frequency Ordered Stop   03/27/23 1400  cefTRIAXone (ROCEPHIN) 2 g in sodium chloride 0.9 % 100 mL IVPB        2 g 200 mL/hr over 30 Minutes Intravenous Every 24 hours 03/27/23 1024 03/29/23 1900   03/25/23 2200  cefTRIAXone (ROCEPHIN) 2 g in sodium chloride 0.9 % 100 mL IVPB  Status:  Discontinued        2 g 200 mL/hr over 30 Minutes Intravenous Every 24 hours 03/25/23 0513 03/25/23 0650   03/25/23 2000  vancomycin (VANCOCIN) IVPB 1000 mg/200 mL premix  Status:  Discontinued        1,000 mg 200 mL/hr over 60 Minutes Intravenous Every 12 hours 03/25/23 0726 03/26/23  1000   03/25/23 0730  piperacillin-tazobactam (ZOSYN) IVPB 3.375 g  Status:  Discontinued        3.375 g 12.5 mL/hr over 240 Minutes Intravenous Every 8 hours 03/25/23 0718 03/27/23 1024   03/25/23 0730  vancomycin (VANCOREADY) IVPB 1750 mg/350 mL        1,750 mg 175 mL/hr over 120 Minutes Intravenous  Once 03/25/23 0719 03/25/23 1216   03/25/23 0715  vancomycin (VANCOCIN) IVPB 1000 mg/200 mL premix  Status:  Discontinued        1,000 mg 200 mL/hr over 60 Minutes Intravenous Every 12 hours 03/25/23 0712 03/25/23 0717   03/25/23 0715  piperacillin-tazobactam (ZOSYN) IVPB 3.375 g  Status:  Discontinued        3.375 g 100 mL/hr over 30 Minutes Intravenous Every 8 hours 03/25/23 0712 03/25/23 0718   03/25/23 0000  doxycycline (VIBRA-TABS) tablet 100 mg  Status:  Discontinued        100 mg Oral 2 times daily 03/24/23 2346 03/25/23 0835   03/24/23 2030  cefTRIAXone (ROCEPHIN) 2 g in sodium chloride 0.9 % 100 mL IVPB        2 g 200 mL/hr over 30 Minutes Intravenous Once 03/24/23 2016 03/24/23 2131       Assessment/Plan:   GLF R frontal calvarium fx Facial fxs  - ENT Dr. Jearld Fenton - monitor approx 1 week prior to further discussion.   - Dr. Dione Booze -  erythromycin ointment for eye and small lac TID  - follow up in 1-2 weeks outpatient R hip periprosthetic fx  - ortho consult - WBAT with RW. No active hip abduction. F/u Marchwiany 2-3 wks - PT/OT UTI  RLL consolidation/? Aspiration vs PNA  - TRH following ?DM H/O metastatic tonsillar cancer Unsteady gait  - frequent falls at home, uses a cane.  Lives by himself. 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 He lives, good support from stepdaughter and grandchild. DME rolling walker, HH PT ordered. D/C this PM.     LOS: 6 days    Violeta Gelinas, MD, MPH, FACS Trauma & General Surgery Use [email or web address] to  contact on call provider  03/30/2023

## 2023-03-30 NOTE — Discharge Summary (Signed)
 Patient ID: [name] [id number] 11-Jun-1950 73 y.o.  Admit date: 03/24/2023 Discharge date: 03/30/2023  Admitting Diagnosis: Fall Right frontal calvarium fracture  Facial fractures  Right hip pain  Discharge Diagnosis Patient Active Problem List   Diagnosis Date Noted   Right lower lobe pulmonary infiltrate 03/26/2023   Acute respiratory failure with hypoxia (HCC) 03/26/2023   Pressure injury of skin 03/26/2023   UTI (urinary tract infection) 03/26/2023   Aspiration pneumonia of right lower lobe (HCC) 03/25/2023   Head injury 03/24/2023   Critical polytrauma 03/24/2023   Delirium    Anuria 01/18/2018   Acute retention of urine 01/17/2018   Acute metabolic encephalopathy 01/15/2018   Dehydration 01/15/2018   Chronic pain syndrome 01/15/2018   Scalp laceration    Metastasis to lung (HCC) 10/19/2013   Mucositis due to antineoplastic therapy 06/10/2013   Protein-calorie malnutrition, severe (HCC) 06/10/2013   Rash 06/10/2013   Gastrostomy tube dyslodgement - replaced 05/06/2013 05/06/2013   Gastrostomy in place [facility]) 04/04/2013   Tonsil cancer (HCC) 03/22/2013   Diabetic neuropathy, painful (HCC) 03/22/2013   Diabetes mellitus, type 2 (HCC) 10/18/2012   Other and unspecified hyperlipidemia 10/18/2012   Obesity, unspecified 10/18/2012  GLF R frontal calvarium fx Facial fxs  R hip periprosthetic fx  UTI  RLL consolidation/? Aspiration vs PNA  ?DM H/O metastatic tonsillar cancer Unsteady gait  Urethral stricture Anemia  Consultants Dr. Sallye Lat, ophthalmology Dr. Suzanna Obey - ENT Dr. Weber Cooks - ortho  Reason for Admission: [name] is an 73 y.o. male who presented as a trauma transfer from [facility] after a fall.  He tripped and fell landing on his face.  His right hip and right face hurt.  No back pain, no neck pain.   Procedures none  Hospital Course:  GLF  R frontal calvarium fx Pain control, no intervention  Facial fxs  -  ENT Dr. Jearld Fenton - monitor approx 1 week prior to further discussion.   - Dr. Dione Booze - erythromycin ointment for eye and small lac TID  - follow up in 1-2 weeks outpatient  R hip periprosthetic fx  - ortho consult - WBAT with RW. No active hip abduction. F/u Marchwiany 2-3 wks - PT/OT recommend HH therapies, this was arranged prior to discharge  UTI  >100,000 colonies of E coli.  Completed Rocephin  RLL consolidation/? Aspiration vs PNA  - TRH following.  Improved and resolved with no further issues  ?DM hgbA1C was normal.  No intervention  H/O metastatic tonsillar cancer  Unsteady gait  - frequent falls at home, uses a cane.  Lives by himself. 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  Unclear etiology if this was dehydration or infectious.  He was treated for his UTI and resuscitated.  He was on a low dose levo periodically for the first 24 hrs, but this was weaned and no longer required.  He was otherwise stable on HD 6 for DC home with the above follow up, equipment, etc arranged.  He has help at home.   Allergies as of 03/30/2023       Reactions   Neurontin [gabapentin] Other (See Comments)   Causes seizures   Buspirone    Lips swelling   Dilaudid [hydromorphone Hcl] Other (See Comments)   Oxygen saturation drops  Medication List     TAKE these medications    acetaminophen 500 MG tablet Commonly known as: TYLENOL Take 2 tablets (1,000 mg total) by mouth every 6 (six) hours as needed.   alprazolam 2 MG tablet Commonly known as: XANAX Take 2 mg by mouth 3 (three) times daily as needed for sleep.   citalopram 40 MG tablet Commonly known as: CELEXA Take 1 tablet by mouth daily.   erythromycin ophthalmic ointment Place into the right eye every 8 (eight) hours for 7 days.   rosuvastatin 10 MG tablet Commonly known as:  CRESTOR Take 10 mg by mouth at bedtime.   traMADol 50 MG tablet Commonly known as: ULTRAM Take 1-2 tablets (50-100 mg total) by mouth every 6 (six) hours as needed for moderate pain (pain score 4-6) or severe pain (pain score 7-10) (50mg  moderate, 100mg  severe pain).               Durable Medical Equipment  (From admission, onward)           Start     Ordered   03/30/23 0916  For home use only DME Walker rolling  Once       Question Answer Comment  Walker: With 5 Inch Wheels   Patient needs a walker to treat with the following condition Hip fracture (HCC)      03/30/23 0916              Follow-up Information     Joen Laura, MD. Schedule an appointment as soon as possible for a visit in 2 week(s).   Specialty: Orthopedic Surgery Contact information: [address] [place] [address] [phone]         Sallye Lat, MD. Call in 1 week(s).   Specialty: Ophthalmology Contact information: [address] [place] [address] [phone]         Suzanna Obey, MD. Call in 1 week(s).   Specialty: Otolaryngology Contact information: [address] [place] [employer] [place] [address] [phone]         Assunta Found, MD Follow up.   Specialty: Family Medicine Why: As needed Contact information: [address] [place] [address] [phone]                 Signed: Barnetta Chapel, [facility] Surgery 03/30/2023, 10:48 AM Please see Amion for pager number during day hours 7:00am-4:30pm, 7-11:30am on Weekends

## 2023-03-30 NOTE — Progress Notes (Signed)
 Pt with orders to d/c home with [facility]. DME delivered to room. PIV removed. Pt is stable. Discharge packet and education provided, all questions answered. Pt and all belongings transferred to private vehicle via wheelchair without incident.

## 2023-03-30 NOTE — Discharge Instructions (Signed)
 Weightbear as tolerates to your lower extremities with a rolling walker

## 2023-03-30 NOTE — Plan of Care (Signed)
  Problem: Acute Rehab PT Goals(only PT should resolve) Goal: Pt Will Go Supine/Side To Sit Outcome: Completed/Met Goal: Patient Will Transfer Sit To/From Stand Outcome: Completed/Met Goal: Pt Will Ambulate Outcome: Completed/Met Goal: Pt Will Go Up/Down Stairs Outcome: Completed/Met

## 2023-03-30 NOTE — TOC Transition Note (Addendum)
 Transition of Care [facility]) - Discharge Note   Patient Details  Name: [name] MRN: [id number] Date of Birth: [birth year]  [facility]) CM/SW Contact:  Glennon Mac, RN Phone Number: 03/30/2023, 11:27 AM   Clinical Narrative:    Patient medically stable for discharge home today.  PT recommending HHPT, and patient agreeable to services.  RW and BSC recommended; patient agrees to RW, but declines BSC as he states he has a handicapped bathroom.  Referral to [facility] for RW, to be delivered to bedside prior to dc.  Referral to [facility] for HHPT and RN.  Patient states he will have assistance from mult family members, mainly stepdaughter and grandson.    Final next level of care: Home w Home Health Services Barriers to Discharge: Barriers Resolved   Patient Goals and CMS Choice   CMS Medicare.gov Compare Post Acute Care list provided to:: Patient Choice offered to / list presented to : Patient                            Discharge Plan and Services Additional resources added to the After Visit Summary for  NA   Discharge Planning Services: CM Consult Post Acute Care Choice: Home Health          DME Arranged: Walker rolling DME Agency: [facility] Date DME Agency Contacted: 03/30/23 Time DME Agency Contacted: [phone] Representative spoke with at DME Agency: Saunders Revel HH Arranged: PT HH Agency: [facility]   Time [facility] Agency Contacted: [phone] Representative spoke with at [facility] Agency: Lorenza Chick  Social Drivers of Health (SDOH) Interventions SDOH Screenings   Food Insecurity: No Food Insecurity (03/25/2023)  Housing: Low Risk  (03/25/2023)  Transportation Needs: No Transportation Needs (03/25/2023)  Utilities: Not At Risk (03/25/2023)  Social Connections: Socially Isolated (03/25/2023)  Tobacco Use: Low Risk  (03/24/2023)     Readmission Risk Interventions     No data to display         Quintella Baton, RN, BSN  Trauma/Neuro  ICU Case Manager [phone]

## 2023-03-30 NOTE — Progress Notes (Signed)
 Physical Therapy Treatment and Discharge Patient Details Name: [name] MRN: [id number] DOB: Oct 09, 1950 Today's Date: 03/30/2023   History of Present Illness 72 yo male admitted 3/12 after fall at bank with laceration of Rt eye, facial fx, periprosthetic right hip fx. PMhx: T2DM, tonsillar and lung CA s/p chemoradiation, chronic urinary retention self-catheterization, HLD, depression, cognitive impairment, fibromyalgia, PEG    PT Comments  Patient is pleasant, easygoing, and eager to participate with therapy. Patient able is independent for bed mobility and ModI for transfers. Patient is ModI for gait training and supervision for stairs, tolerated ambulating 166ft with a RW. Minimal cues provided for safety for stair negotiation. Patient will benefit from HHPT to maximize mobility and independence. No further acute PT is recommended at this time.      If plan is discharge home, recommend the following: A little help with walking and/or transfers;A little help with bathing/dressing/bathroom;Assistance with cooking/housework;Assist for transportation;Help with stairs or ramp for entrance   Can travel by private vehicle        Equipment Recommendations  Rolling walker (2 wheels)    Recommendations for Other Services       Precautions / Restrictions Precautions Precautions: Fall;Other (comment) Recall of Precautions/Restrictions: Intact Restrictions Weight Bearing Restrictions Per Provider Order: Yes RLE Weight Bearing Per Provider Order: Weight bearing as tolerated     Mobility  Bed Mobility Overal bed mobility: Independent Bed Mobility: Supine to Sit           General bed mobility comments: Patient able to transition EOB without any difficulty, reports minimal achiness to R hip    Transfers Overall transfer level: Modified independent Equipment used: Rolling walker (2 wheels) Transfers: Sit to/from Stand Sit to Stand: Modified independent (Device/Increase time)            General transfer comment: Mod I for increased time ascending from a seated position, distributes weight through bilateral LEs and uses UE support for ascension    Ambulation/Gait Ambulation/Gait assistance: Modified independent (Device/Increase time) Gait Distance (Feet): 175 Feet Assistive device: Rolling walker (2 wheels) Gait Pattern/deviations: Step-through pattern, Decreased stride length       General Gait Details: Reported achniness, patient tolerated activity with no difficulty   Stairs Stairs: Yes Stairs assistance: Supervision Stair Management: One rail Right, Step to pattern Number of Stairs: 2 General stair comments: Cues for safe stair negotiation   Wheelchair Mobility     Tilt Bed    Modified Rankin (Stroke Patients Only)       Balance Overall balance assessment: Modified Independent Sitting-balance support: No upper extremity supported, Feet supported Sitting balance-Leahy Scale: Normal     Standing balance support: During functional activity, Reliant on assistive device for balance Standing balance-Leahy Scale: Fair Standing balance comment: Relies on RW to maintain balance                            Communication Communication Communication: Impaired Factors Affecting Communication: Reduced clarity of speech  Cognition Arousal: Alert Behavior During Therapy: WFL for tasks assessed/performed   PT - Cognitive impairments: No family/caregiver present to determine baseline, Problem solving                       PT - Cognition Comments: A&Ox4 Following commands: Intact      Cueing Cueing Techniques: Verbal cues, Gestural cues, Tactile cues  Exercises      General Comments General comments (skin integrity, edema,  etc.): VSS - O2% 91-94% during functional mobility      Pertinent Vitals/Pain Pain Assessment Pain Assessment: Faces Faces Pain Scale: Hurts a little bit Pain Location: R Hip Pain Descriptors /  Indicators: Aching Pain Intervention(s): Monitored during session, Repositioned, Ice applied    Home Living                          Prior Function            PT Goals (current goals can now be found in the care plan section) Acute Rehab PT Goals Patient Stated Goal: Return Home PT Goal Formulation: With patient Time For Goal Achievement: 04/09/23 Potential to Achieve Goals: Good Progress towards PT goals: Progressing toward goals    Frequency    Min 2X/week      PT Plan      Co-evaluation              AM-PAC PT "6 Clicks" Mobility   Outcome Measure  Help needed turning from your back to your side while in a flat bed without using bedrails?: None Help needed moving from lying on your back to sitting on the side of a flat bed without using bedrails?: None Help needed moving to and from a bed to a chair (including a wheelchair)?: A Little Help needed standing up from a chair using your arms (e.g., wheelchair or bedside chair)?: A Little Help needed to walk in hospital room?: A Little Help needed climbing 3-5 steps with a railing? : A Little 6 Click Score: 20    End of Session Equipment Utilized During Treatment: Gait belt Activity Tolerance: Patient tolerated treatment well Patient left: in chair;with call bell/phone within reach;with chair alarm set Nurse Communication: Mobility status PT Visit Diagnosis: Other abnormalities of gait and mobility (R26.89);Difficulty in walking, not elsewhere classified (R26.2);History of falling (Z91.81)     Time: 9563-8756 PT Time Calculation (min) (ACUTE ONLY): 24 min  Charges:    $Gait Training: 8-22 mins $Therapeutic Activity: 8-22 mins PT General Charges $$ ACUTE PT VISIT: 1 Visit                     Doreen Beam, SPT   D'Arcy Abraha 03/30/2023, 9:29 AM

## 2023-04-01 DIAGNOSIS — F419 Anxiety disorder, unspecified: Secondary | ICD-10-CM | POA: Diagnosis not present

## 2023-04-01 DIAGNOSIS — M9701XD Periprosthetic fracture around internal prosthetic right hip joint, subsequent encounter: Secondary | ICD-10-CM | POA: Diagnosis not present

## 2023-04-01 DIAGNOSIS — M797 Fibromyalgia: Secondary | ICD-10-CM | POA: Diagnosis not present

## 2023-04-01 DIAGNOSIS — D630 Anemia in neoplastic disease: Secondary | ICD-10-CM | POA: Diagnosis not present

## 2023-04-01 DIAGNOSIS — Z6821 Body mass index (BMI) 21.0-21.9, adult: Secondary | ICD-10-CM | POA: Diagnosis not present

## 2023-04-01 DIAGNOSIS — C7800 Secondary malignant neoplasm of unspecified lung: Secondary | ICD-10-CM | POA: Diagnosis not present

## 2023-04-01 DIAGNOSIS — M1612 Unilateral primary osteoarthritis, left hip: Secondary | ICD-10-CM | POA: Diagnosis not present

## 2023-04-01 DIAGNOSIS — E669 Obesity, unspecified: Secondary | ICD-10-CM | POA: Diagnosis not present

## 2023-04-01 DIAGNOSIS — S72001D Fracture of unspecified part of neck of right femur, subsequent encounter for closed fracture with routine healing: Secondary | ICD-10-CM | POA: Diagnosis not present

## 2023-04-01 DIAGNOSIS — L89322 Pressure ulcer of left buttock, stage 2: Secondary | ICD-10-CM | POA: Diagnosis not present

## 2023-04-01 DIAGNOSIS — K5900 Constipation, unspecified: Secondary | ICD-10-CM | POA: Diagnosis not present

## 2023-04-01 DIAGNOSIS — G2581 Restless legs syndrome: Secondary | ICD-10-CM | POA: Diagnosis not present

## 2023-04-01 DIAGNOSIS — M19071 Primary osteoarthritis, right ankle and foot: Secondary | ICD-10-CM | POA: Diagnosis not present

## 2023-04-01 DIAGNOSIS — E1140 Type 2 diabetes mellitus with diabetic neuropathy, unspecified: Secondary | ICD-10-CM | POA: Diagnosis not present

## 2023-04-01 DIAGNOSIS — F32A Depression, unspecified: Secondary | ICD-10-CM | POA: Diagnosis not present

## 2023-04-01 DIAGNOSIS — S0292XD Unspecified fracture of facial bones, subsequent encounter for fracture with routine healing: Secondary | ICD-10-CM | POA: Diagnosis not present

## 2023-04-01 DIAGNOSIS — G894 Chronic pain syndrome: Secondary | ICD-10-CM | POA: Diagnosis not present

## 2023-04-01 DIAGNOSIS — M19072 Primary osteoarthritis, left ankle and foot: Secondary | ICD-10-CM | POA: Diagnosis not present

## 2023-04-01 DIAGNOSIS — E43 Unspecified severe protein-calorie malnutrition: Secondary | ICD-10-CM | POA: Diagnosis not present

## 2023-04-01 DIAGNOSIS — E7849 Other hyperlipidemia: Secondary | ICD-10-CM | POA: Diagnosis not present

## 2023-04-01 DIAGNOSIS — T451X5S Adverse effect of antineoplastic and immunosuppressive drugs, sequela: Secondary | ICD-10-CM | POA: Diagnosis not present

## 2023-04-01 DIAGNOSIS — W1830XD Fall on same level, unspecified, subsequent encounter: Secondary | ICD-10-CM | POA: Diagnosis not present

## 2023-04-01 DIAGNOSIS — S020XXD Fracture of vault of skull, subsequent encounter for fracture with routine healing: Secondary | ICD-10-CM | POA: Diagnosis not present

## 2023-04-01 DIAGNOSIS — K1232 Oral mucositis (ulcerative) due to other drugs: Secondary | ICD-10-CM | POA: Diagnosis not present

## 2023-04-01 DIAGNOSIS — M170 Bilateral primary osteoarthritis of knee: Secondary | ICD-10-CM | POA: Diagnosis not present

## 2023-04-07 ENCOUNTER — Encounter (INDEPENDENT_AMBULATORY_CARE_PROVIDER_SITE_OTHER): Payer: Self-pay | Admitting: Otolaryngology

## 2023-04-07 DIAGNOSIS — M978XXD Periprosthetic fracture around other internal prosthetic joint, subsequent encounter: Secondary | ICD-10-CM | POA: Diagnosis not present

## 2023-04-08 DIAGNOSIS — E7849 Other hyperlipidemia: Secondary | ICD-10-CM | POA: Diagnosis not present

## 2023-04-08 DIAGNOSIS — G894 Chronic pain syndrome: Secondary | ICD-10-CM | POA: Diagnosis not present

## 2023-04-08 DIAGNOSIS — M9701XD Periprosthetic fracture around internal prosthetic right hip joint, subsequent encounter: Secondary | ICD-10-CM | POA: Diagnosis not present

## 2023-04-08 DIAGNOSIS — D630 Anemia in neoplastic disease: Secondary | ICD-10-CM | POA: Diagnosis not present

## 2023-04-08 DIAGNOSIS — M19071 Primary osteoarthritis, right ankle and foot: Secondary | ICD-10-CM | POA: Diagnosis not present

## 2023-04-08 DIAGNOSIS — M1612 Unilateral primary osteoarthritis, left hip: Secondary | ICD-10-CM | POA: Diagnosis not present

## 2023-04-08 DIAGNOSIS — S020XXD Fracture of vault of skull, subsequent encounter for fracture with routine healing: Secondary | ICD-10-CM | POA: Diagnosis not present

## 2023-04-08 DIAGNOSIS — W1830XD Fall on same level, unspecified, subsequent encounter: Secondary | ICD-10-CM | POA: Diagnosis not present

## 2023-04-08 DIAGNOSIS — M170 Bilateral primary osteoarthritis of knee: Secondary | ICD-10-CM | POA: Diagnosis not present

## 2023-04-08 DIAGNOSIS — E43 Unspecified severe protein-calorie malnutrition: Secondary | ICD-10-CM | POA: Diagnosis not present

## 2023-04-08 DIAGNOSIS — S0292XD Unspecified fracture of facial bones, subsequent encounter for fracture with routine healing: Secondary | ICD-10-CM | POA: Diagnosis not present

## 2023-04-08 DIAGNOSIS — M19072 Primary osteoarthritis, left ankle and foot: Secondary | ICD-10-CM | POA: Diagnosis not present

## 2023-04-08 DIAGNOSIS — S72001D Fracture of unspecified part of neck of right femur, subsequent encounter for closed fracture with routine healing: Secondary | ICD-10-CM | POA: Diagnosis not present

## 2023-04-08 DIAGNOSIS — E1140 Type 2 diabetes mellitus with diabetic neuropathy, unspecified: Secondary | ICD-10-CM | POA: Diagnosis not present

## 2023-04-08 DIAGNOSIS — C7800 Secondary malignant neoplasm of unspecified lung: Secondary | ICD-10-CM | POA: Diagnosis not present

## 2023-04-08 DIAGNOSIS — L89322 Pressure ulcer of left buttock, stage 2: Secondary | ICD-10-CM | POA: Diagnosis not present

## 2023-04-13 DIAGNOSIS — S3282XS Multiple fractures of pelvis without disruption of pelvic ring, sequela: Secondary | ICD-10-CM | POA: Diagnosis not present

## 2023-04-13 DIAGNOSIS — S0281XG Fracture of other specified skull and facial bones, right side, subsequent encounter for fracture with delayed healing: Secondary | ICD-10-CM | POA: Diagnosis not present

## 2023-04-13 DIAGNOSIS — R102 Pelvic and perineal pain: Secondary | ICD-10-CM | POA: Diagnosis not present

## 2023-04-13 DIAGNOSIS — Z6820 Body mass index (BMI) 20.0-20.9, adult: Secondary | ICD-10-CM | POA: Diagnosis not present

## 2023-04-15 DIAGNOSIS — C7800 Secondary malignant neoplasm of unspecified lung: Secondary | ICD-10-CM | POA: Diagnosis not present

## 2023-04-15 DIAGNOSIS — L89322 Pressure ulcer of left buttock, stage 2: Secondary | ICD-10-CM | POA: Diagnosis not present

## 2023-04-15 DIAGNOSIS — M1612 Unilateral primary osteoarthritis, left hip: Secondary | ICD-10-CM | POA: Diagnosis not present

## 2023-04-15 DIAGNOSIS — E43 Unspecified severe protein-calorie malnutrition: Secondary | ICD-10-CM | POA: Diagnosis not present

## 2023-04-15 DIAGNOSIS — M19071 Primary osteoarthritis, right ankle and foot: Secondary | ICD-10-CM | POA: Diagnosis not present

## 2023-04-15 DIAGNOSIS — S0292XD Unspecified fracture of facial bones, subsequent encounter for fracture with routine healing: Secondary | ICD-10-CM | POA: Diagnosis not present

## 2023-04-15 DIAGNOSIS — W1830XD Fall on same level, unspecified, subsequent encounter: Secondary | ICD-10-CM | POA: Diagnosis not present

## 2023-04-15 DIAGNOSIS — E1140 Type 2 diabetes mellitus with diabetic neuropathy, unspecified: Secondary | ICD-10-CM | POA: Diagnosis not present

## 2023-04-15 DIAGNOSIS — S020XXD Fracture of vault of skull, subsequent encounter for fracture with routine healing: Secondary | ICD-10-CM | POA: Diagnosis not present

## 2023-04-15 DIAGNOSIS — S72001D Fracture of unspecified part of neck of right femur, subsequent encounter for closed fracture with routine healing: Secondary | ICD-10-CM | POA: Diagnosis not present

## 2023-04-15 DIAGNOSIS — E7849 Other hyperlipidemia: Secondary | ICD-10-CM | POA: Diagnosis not present

## 2023-04-15 DIAGNOSIS — D630 Anemia in neoplastic disease: Secondary | ICD-10-CM | POA: Diagnosis not present

## 2023-04-15 DIAGNOSIS — G894 Chronic pain syndrome: Secondary | ICD-10-CM | POA: Diagnosis not present

## 2023-04-15 DIAGNOSIS — M9701XD Periprosthetic fracture around internal prosthetic right hip joint, subsequent encounter: Secondary | ICD-10-CM | POA: Diagnosis not present

## 2023-04-15 DIAGNOSIS — M19072 Primary osteoarthritis, left ankle and foot: Secondary | ICD-10-CM | POA: Diagnosis not present

## 2023-04-15 DIAGNOSIS — M170 Bilateral primary osteoarthritis of knee: Secondary | ICD-10-CM | POA: Diagnosis not present

## 2023-04-16 DIAGNOSIS — M170 Bilateral primary osteoarthritis of knee: Secondary | ICD-10-CM | POA: Diagnosis not present

## 2023-04-16 DIAGNOSIS — E43 Unspecified severe protein-calorie malnutrition: Secondary | ICD-10-CM | POA: Diagnosis not present

## 2023-04-16 DIAGNOSIS — G894 Chronic pain syndrome: Secondary | ICD-10-CM | POA: Diagnosis not present

## 2023-04-16 DIAGNOSIS — M19072 Primary osteoarthritis, left ankle and foot: Secondary | ICD-10-CM | POA: Diagnosis not present

## 2023-04-16 DIAGNOSIS — S72001D Fracture of unspecified part of neck of right femur, subsequent encounter for closed fracture with routine healing: Secondary | ICD-10-CM | POA: Diagnosis not present

## 2023-04-16 DIAGNOSIS — C7800 Secondary malignant neoplasm of unspecified lung: Secondary | ICD-10-CM | POA: Diagnosis not present

## 2023-04-16 DIAGNOSIS — M9701XD Periprosthetic fracture around internal prosthetic right hip joint, subsequent encounter: Secondary | ICD-10-CM | POA: Diagnosis not present

## 2023-04-16 DIAGNOSIS — S0292XD Unspecified fracture of facial bones, subsequent encounter for fracture with routine healing: Secondary | ICD-10-CM | POA: Diagnosis not present

## 2023-04-16 DIAGNOSIS — M19071 Primary osteoarthritis, right ankle and foot: Secondary | ICD-10-CM | POA: Diagnosis not present

## 2023-04-16 DIAGNOSIS — W1830XD Fall on same level, unspecified, subsequent encounter: Secondary | ICD-10-CM | POA: Diagnosis not present

## 2023-04-16 DIAGNOSIS — D630 Anemia in neoplastic disease: Secondary | ICD-10-CM | POA: Diagnosis not present

## 2023-04-16 DIAGNOSIS — E1140 Type 2 diabetes mellitus with diabetic neuropathy, unspecified: Secondary | ICD-10-CM | POA: Diagnosis not present

## 2023-04-16 DIAGNOSIS — M1612 Unilateral primary osteoarthritis, left hip: Secondary | ICD-10-CM | POA: Diagnosis not present

## 2023-04-16 DIAGNOSIS — L89322 Pressure ulcer of left buttock, stage 2: Secondary | ICD-10-CM | POA: Diagnosis not present

## 2023-04-16 DIAGNOSIS — E7849 Other hyperlipidemia: Secondary | ICD-10-CM | POA: Diagnosis not present

## 2023-04-16 DIAGNOSIS — S020XXD Fracture of vault of skull, subsequent encounter for fracture with routine healing: Secondary | ICD-10-CM | POA: Diagnosis not present

## 2023-04-20 DIAGNOSIS — H02535 Eyelid retraction left lower eyelid: Secondary | ICD-10-CM | POA: Diagnosis not present

## 2023-04-20 DIAGNOSIS — H2513 Age-related nuclear cataract, bilateral: Secondary | ICD-10-CM | POA: Diagnosis not present

## 2023-04-20 DIAGNOSIS — H532 Diplopia: Secondary | ICD-10-CM | POA: Diagnosis not present

## 2023-04-20 DIAGNOSIS — H02532 Eyelid retraction right lower eyelid: Secondary | ICD-10-CM | POA: Diagnosis not present

## 2023-04-21 DIAGNOSIS — D630 Anemia in neoplastic disease: Secondary | ICD-10-CM | POA: Diagnosis not present

## 2023-04-21 DIAGNOSIS — M19072 Primary osteoarthritis, left ankle and foot: Secondary | ICD-10-CM | POA: Diagnosis not present

## 2023-04-21 DIAGNOSIS — C7800 Secondary malignant neoplasm of unspecified lung: Secondary | ICD-10-CM | POA: Diagnosis not present

## 2023-04-21 DIAGNOSIS — E1140 Type 2 diabetes mellitus with diabetic neuropathy, unspecified: Secondary | ICD-10-CM | POA: Diagnosis not present

## 2023-04-21 DIAGNOSIS — M1612 Unilateral primary osteoarthritis, left hip: Secondary | ICD-10-CM | POA: Diagnosis not present

## 2023-04-21 DIAGNOSIS — L89322 Pressure ulcer of left buttock, stage 2: Secondary | ICD-10-CM | POA: Diagnosis not present

## 2023-04-21 DIAGNOSIS — W1830XD Fall on same level, unspecified, subsequent encounter: Secondary | ICD-10-CM | POA: Diagnosis not present

## 2023-04-21 DIAGNOSIS — M19071 Primary osteoarthritis, right ankle and foot: Secondary | ICD-10-CM | POA: Diagnosis not present

## 2023-04-21 DIAGNOSIS — S020XXD Fracture of vault of skull, subsequent encounter for fracture with routine healing: Secondary | ICD-10-CM | POA: Diagnosis not present

## 2023-04-21 DIAGNOSIS — E43 Unspecified severe protein-calorie malnutrition: Secondary | ICD-10-CM | POA: Diagnosis not present

## 2023-04-21 DIAGNOSIS — M9701XD Periprosthetic fracture around internal prosthetic right hip joint, subsequent encounter: Secondary | ICD-10-CM | POA: Diagnosis not present

## 2023-04-21 DIAGNOSIS — M170 Bilateral primary osteoarthritis of knee: Secondary | ICD-10-CM | POA: Diagnosis not present

## 2023-04-21 DIAGNOSIS — S0292XD Unspecified fracture of facial bones, subsequent encounter for fracture with routine healing: Secondary | ICD-10-CM | POA: Diagnosis not present

## 2023-04-21 DIAGNOSIS — E7849 Other hyperlipidemia: Secondary | ICD-10-CM | POA: Diagnosis not present

## 2023-04-21 DIAGNOSIS — S72001D Fracture of unspecified part of neck of right femur, subsequent encounter for closed fracture with routine healing: Secondary | ICD-10-CM | POA: Diagnosis not present

## 2023-04-21 DIAGNOSIS — G894 Chronic pain syndrome: Secondary | ICD-10-CM | POA: Diagnosis not present

## 2023-04-22 DIAGNOSIS — M19072 Primary osteoarthritis, left ankle and foot: Secondary | ICD-10-CM | POA: Diagnosis not present

## 2023-04-22 DIAGNOSIS — S020XXD Fracture of vault of skull, subsequent encounter for fracture with routine healing: Secondary | ICD-10-CM | POA: Diagnosis not present

## 2023-04-22 DIAGNOSIS — S0292XD Unspecified fracture of facial bones, subsequent encounter for fracture with routine healing: Secondary | ICD-10-CM | POA: Diagnosis not present

## 2023-04-22 DIAGNOSIS — E43 Unspecified severe protein-calorie malnutrition: Secondary | ICD-10-CM | POA: Diagnosis not present

## 2023-04-22 DIAGNOSIS — M9701XD Periprosthetic fracture around internal prosthetic right hip joint, subsequent encounter: Secondary | ICD-10-CM | POA: Diagnosis not present

## 2023-04-22 DIAGNOSIS — E7849 Other hyperlipidemia: Secondary | ICD-10-CM | POA: Diagnosis not present

## 2023-04-22 DIAGNOSIS — M170 Bilateral primary osteoarthritis of knee: Secondary | ICD-10-CM | POA: Diagnosis not present

## 2023-04-22 DIAGNOSIS — C7800 Secondary malignant neoplasm of unspecified lung: Secondary | ICD-10-CM | POA: Diagnosis not present

## 2023-04-22 DIAGNOSIS — L89322 Pressure ulcer of left buttock, stage 2: Secondary | ICD-10-CM | POA: Diagnosis not present

## 2023-04-22 DIAGNOSIS — M19071 Primary osteoarthritis, right ankle and foot: Secondary | ICD-10-CM | POA: Diagnosis not present

## 2023-04-22 DIAGNOSIS — M1612 Unilateral primary osteoarthritis, left hip: Secondary | ICD-10-CM | POA: Diagnosis not present

## 2023-04-22 DIAGNOSIS — G894 Chronic pain syndrome: Secondary | ICD-10-CM | POA: Diagnosis not present

## 2023-04-22 DIAGNOSIS — D630 Anemia in neoplastic disease: Secondary | ICD-10-CM | POA: Diagnosis not present

## 2023-04-22 DIAGNOSIS — E1140 Type 2 diabetes mellitus with diabetic neuropathy, unspecified: Secondary | ICD-10-CM | POA: Diagnosis not present

## 2023-04-22 DIAGNOSIS — S72001D Fracture of unspecified part of neck of right femur, subsequent encounter for closed fracture with routine healing: Secondary | ICD-10-CM | POA: Diagnosis not present

## 2023-04-22 DIAGNOSIS — W1830XD Fall on same level, unspecified, subsequent encounter: Secondary | ICD-10-CM | POA: Diagnosis not present

## 2023-04-28 DIAGNOSIS — W1830XD Fall on same level, unspecified, subsequent encounter: Secondary | ICD-10-CM | POA: Diagnosis not present

## 2023-04-28 DIAGNOSIS — E43 Unspecified severe protein-calorie malnutrition: Secondary | ICD-10-CM | POA: Diagnosis not present

## 2023-04-28 DIAGNOSIS — S0292XD Unspecified fracture of facial bones, subsequent encounter for fracture with routine healing: Secondary | ICD-10-CM | POA: Diagnosis not present

## 2023-04-28 DIAGNOSIS — D630 Anemia in neoplastic disease: Secondary | ICD-10-CM | POA: Diagnosis not present

## 2023-04-28 DIAGNOSIS — L89322 Pressure ulcer of left buttock, stage 2: Secondary | ICD-10-CM | POA: Diagnosis not present

## 2023-04-28 DIAGNOSIS — S020XXD Fracture of vault of skull, subsequent encounter for fracture with routine healing: Secondary | ICD-10-CM | POA: Diagnosis not present

## 2023-04-28 DIAGNOSIS — M19072 Primary osteoarthritis, left ankle and foot: Secondary | ICD-10-CM | POA: Diagnosis not present

## 2023-04-28 DIAGNOSIS — E1140 Type 2 diabetes mellitus with diabetic neuropathy, unspecified: Secondary | ICD-10-CM | POA: Diagnosis not present

## 2023-04-28 DIAGNOSIS — M9701XD Periprosthetic fracture around internal prosthetic right hip joint, subsequent encounter: Secondary | ICD-10-CM | POA: Diagnosis not present

## 2023-04-28 DIAGNOSIS — M170 Bilateral primary osteoarthritis of knee: Secondary | ICD-10-CM | POA: Diagnosis not present

## 2023-04-28 DIAGNOSIS — M19071 Primary osteoarthritis, right ankle and foot: Secondary | ICD-10-CM | POA: Diagnosis not present

## 2023-04-28 DIAGNOSIS — S72001D Fracture of unspecified part of neck of right femur, subsequent encounter for closed fracture with routine healing: Secondary | ICD-10-CM | POA: Diagnosis not present

## 2023-04-28 DIAGNOSIS — C7800 Secondary malignant neoplasm of unspecified lung: Secondary | ICD-10-CM | POA: Diagnosis not present

## 2023-04-28 DIAGNOSIS — M1612 Unilateral primary osteoarthritis, left hip: Secondary | ICD-10-CM | POA: Diagnosis not present

## 2023-04-28 DIAGNOSIS — E7849 Other hyperlipidemia: Secondary | ICD-10-CM | POA: Diagnosis not present

## 2023-04-28 DIAGNOSIS — G894 Chronic pain syndrome: Secondary | ICD-10-CM | POA: Diagnosis not present

## 2023-04-30 DIAGNOSIS — S72001D Fracture of unspecified part of neck of right femur, subsequent encounter for closed fracture with routine healing: Secondary | ICD-10-CM | POA: Diagnosis not present

## 2023-04-30 DIAGNOSIS — S020XXD Fracture of vault of skull, subsequent encounter for fracture with routine healing: Secondary | ICD-10-CM | POA: Diagnosis not present

## 2023-04-30 DIAGNOSIS — D630 Anemia in neoplastic disease: Secondary | ICD-10-CM | POA: Diagnosis not present

## 2023-04-30 DIAGNOSIS — W1830XD Fall on same level, unspecified, subsequent encounter: Secondary | ICD-10-CM | POA: Diagnosis not present

## 2023-04-30 DIAGNOSIS — M1612 Unilateral primary osteoarthritis, left hip: Secondary | ICD-10-CM | POA: Diagnosis not present

## 2023-04-30 DIAGNOSIS — C7800 Secondary malignant neoplasm of unspecified lung: Secondary | ICD-10-CM | POA: Diagnosis not present

## 2023-04-30 DIAGNOSIS — E43 Unspecified severe protein-calorie malnutrition: Secondary | ICD-10-CM | POA: Diagnosis not present

## 2023-04-30 DIAGNOSIS — M170 Bilateral primary osteoarthritis of knee: Secondary | ICD-10-CM | POA: Diagnosis not present

## 2023-04-30 DIAGNOSIS — E1140 Type 2 diabetes mellitus with diabetic neuropathy, unspecified: Secondary | ICD-10-CM | POA: Diagnosis not present

## 2023-04-30 DIAGNOSIS — S0292XD Unspecified fracture of facial bones, subsequent encounter for fracture with routine healing: Secondary | ICD-10-CM | POA: Diagnosis not present

## 2023-04-30 DIAGNOSIS — M9701XD Periprosthetic fracture around internal prosthetic right hip joint, subsequent encounter: Secondary | ICD-10-CM | POA: Diagnosis not present

## 2023-04-30 DIAGNOSIS — L89322 Pressure ulcer of left buttock, stage 2: Secondary | ICD-10-CM | POA: Diagnosis not present

## 2023-04-30 DIAGNOSIS — M19072 Primary osteoarthritis, left ankle and foot: Secondary | ICD-10-CM | POA: Diagnosis not present

## 2023-04-30 DIAGNOSIS — G894 Chronic pain syndrome: Secondary | ICD-10-CM | POA: Diagnosis not present

## 2023-04-30 DIAGNOSIS — E7849 Other hyperlipidemia: Secondary | ICD-10-CM | POA: Diagnosis not present

## 2023-04-30 DIAGNOSIS — M19071 Primary osteoarthritis, right ankle and foot: Secondary | ICD-10-CM | POA: Diagnosis not present

## 2023-05-04 DIAGNOSIS — S72001D Fracture of unspecified part of neck of right femur, subsequent encounter for closed fracture with routine healing: Secondary | ICD-10-CM | POA: Diagnosis not present

## 2023-05-04 DIAGNOSIS — F419 Anxiety disorder, unspecified: Secondary | ICD-10-CM | POA: Diagnosis not present

## 2023-05-04 DIAGNOSIS — K5900 Constipation, unspecified: Secondary | ICD-10-CM | POA: Diagnosis not present

## 2023-05-04 DIAGNOSIS — M1612 Unilateral primary osteoarthritis, left hip: Secondary | ICD-10-CM | POA: Diagnosis not present

## 2023-05-04 DIAGNOSIS — E7849 Other hyperlipidemia: Secondary | ICD-10-CM | POA: Diagnosis not present

## 2023-05-04 DIAGNOSIS — S020XXD Fracture of vault of skull, subsequent encounter for fracture with routine healing: Secondary | ICD-10-CM | POA: Diagnosis not present

## 2023-05-04 DIAGNOSIS — M170 Bilateral primary osteoarthritis of knee: Secondary | ICD-10-CM | POA: Diagnosis not present

## 2023-05-04 DIAGNOSIS — M797 Fibromyalgia: Secondary | ICD-10-CM | POA: Diagnosis not present

## 2023-05-04 DIAGNOSIS — T451X5S Adverse effect of antineoplastic and immunosuppressive drugs, sequela: Secondary | ICD-10-CM | POA: Diagnosis not present

## 2023-05-04 DIAGNOSIS — M19071 Primary osteoarthritis, right ankle and foot: Secondary | ICD-10-CM | POA: Diagnosis not present

## 2023-05-04 DIAGNOSIS — C7800 Secondary malignant neoplasm of unspecified lung: Secondary | ICD-10-CM | POA: Diagnosis not present

## 2023-05-04 DIAGNOSIS — L89322 Pressure ulcer of left buttock, stage 2: Secondary | ICD-10-CM | POA: Diagnosis not present

## 2023-05-04 DIAGNOSIS — Z6821 Body mass index (BMI) 21.0-21.9, adult: Secondary | ICD-10-CM | POA: Diagnosis not present

## 2023-05-04 DIAGNOSIS — D630 Anemia in neoplastic disease: Secondary | ICD-10-CM | POA: Diagnosis not present

## 2023-05-04 DIAGNOSIS — G2581 Restless legs syndrome: Secondary | ICD-10-CM | POA: Diagnosis not present

## 2023-05-04 DIAGNOSIS — E1140 Type 2 diabetes mellitus with diabetic neuropathy, unspecified: Secondary | ICD-10-CM | POA: Diagnosis not present

## 2023-05-04 DIAGNOSIS — M9701XD Periprosthetic fracture around internal prosthetic right hip joint, subsequent encounter: Secondary | ICD-10-CM | POA: Diagnosis not present

## 2023-05-04 DIAGNOSIS — S0292XD Unspecified fracture of facial bones, subsequent encounter for fracture with routine healing: Secondary | ICD-10-CM | POA: Diagnosis not present

## 2023-05-04 DIAGNOSIS — G894 Chronic pain syndrome: Secondary | ICD-10-CM | POA: Diagnosis not present

## 2023-05-04 DIAGNOSIS — E43 Unspecified severe protein-calorie malnutrition: Secondary | ICD-10-CM | POA: Diagnosis not present

## 2023-05-04 DIAGNOSIS — E669 Obesity, unspecified: Secondary | ICD-10-CM | POA: Diagnosis not present

## 2023-05-04 DIAGNOSIS — F32A Depression, unspecified: Secondary | ICD-10-CM | POA: Diagnosis not present

## 2023-05-04 DIAGNOSIS — M19072 Primary osteoarthritis, left ankle and foot: Secondary | ICD-10-CM | POA: Diagnosis not present

## 2023-05-04 DIAGNOSIS — W1830XD Fall on same level, unspecified, subsequent encounter: Secondary | ICD-10-CM | POA: Diagnosis not present

## 2023-05-04 DIAGNOSIS — K1232 Oral mucositis (ulcerative) due to other drugs: Secondary | ICD-10-CM | POA: Diagnosis not present

## 2023-05-06 DIAGNOSIS — M19072 Primary osteoarthritis, left ankle and foot: Secondary | ICD-10-CM | POA: Diagnosis not present

## 2023-05-06 DIAGNOSIS — E43 Unspecified severe protein-calorie malnutrition: Secondary | ICD-10-CM | POA: Diagnosis not present

## 2023-05-06 DIAGNOSIS — M797 Fibromyalgia: Secondary | ICD-10-CM | POA: Diagnosis not present

## 2023-05-06 DIAGNOSIS — F32A Depression, unspecified: Secondary | ICD-10-CM | POA: Diagnosis not present

## 2023-05-06 DIAGNOSIS — D630 Anemia in neoplastic disease: Secondary | ICD-10-CM | POA: Diagnosis not present

## 2023-05-06 DIAGNOSIS — Z6821 Body mass index (BMI) 21.0-21.9, adult: Secondary | ICD-10-CM | POA: Diagnosis not present

## 2023-05-06 DIAGNOSIS — S020XXD Fracture of vault of skull, subsequent encounter for fracture with routine healing: Secondary | ICD-10-CM | POA: Diagnosis not present

## 2023-05-06 DIAGNOSIS — W1830XD Fall on same level, unspecified, subsequent encounter: Secondary | ICD-10-CM | POA: Diagnosis not present

## 2023-05-06 DIAGNOSIS — G2581 Restless legs syndrome: Secondary | ICD-10-CM | POA: Diagnosis not present

## 2023-05-06 DIAGNOSIS — L89322 Pressure ulcer of left buttock, stage 2: Secondary | ICD-10-CM | POA: Diagnosis not present

## 2023-05-06 DIAGNOSIS — S72001D Fracture of unspecified part of neck of right femur, subsequent encounter for closed fracture with routine healing: Secondary | ICD-10-CM | POA: Diagnosis not present

## 2023-05-06 DIAGNOSIS — E669 Obesity, unspecified: Secondary | ICD-10-CM | POA: Diagnosis not present

## 2023-05-06 DIAGNOSIS — M170 Bilateral primary osteoarthritis of knee: Secondary | ICD-10-CM | POA: Diagnosis not present

## 2023-05-06 DIAGNOSIS — F419 Anxiety disorder, unspecified: Secondary | ICD-10-CM | POA: Diagnosis not present

## 2023-05-06 DIAGNOSIS — K1232 Oral mucositis (ulcerative) due to other drugs: Secondary | ICD-10-CM | POA: Diagnosis not present

## 2023-05-06 DIAGNOSIS — M1612 Unilateral primary osteoarthritis, left hip: Secondary | ICD-10-CM | POA: Diagnosis not present

## 2023-05-06 DIAGNOSIS — M9701XD Periprosthetic fracture around internal prosthetic right hip joint, subsequent encounter: Secondary | ICD-10-CM | POA: Diagnosis not present

## 2023-05-06 DIAGNOSIS — M19071 Primary osteoarthritis, right ankle and foot: Secondary | ICD-10-CM | POA: Diagnosis not present

## 2023-05-06 DIAGNOSIS — C7800 Secondary malignant neoplasm of unspecified lung: Secondary | ICD-10-CM | POA: Diagnosis not present

## 2023-05-06 DIAGNOSIS — K5900 Constipation, unspecified: Secondary | ICD-10-CM | POA: Diagnosis not present

## 2023-05-06 DIAGNOSIS — T451X5S Adverse effect of antineoplastic and immunosuppressive drugs, sequela: Secondary | ICD-10-CM | POA: Diagnosis not present

## 2023-05-06 DIAGNOSIS — E1140 Type 2 diabetes mellitus with diabetic neuropathy, unspecified: Secondary | ICD-10-CM | POA: Diagnosis not present

## 2023-05-06 DIAGNOSIS — S0292XD Unspecified fracture of facial bones, subsequent encounter for fracture with routine healing: Secondary | ICD-10-CM | POA: Diagnosis not present

## 2023-05-06 DIAGNOSIS — E7849 Other hyperlipidemia: Secondary | ICD-10-CM | POA: Diagnosis not present

## 2023-05-06 DIAGNOSIS — G894 Chronic pain syndrome: Secondary | ICD-10-CM | POA: Diagnosis not present

## 2023-05-08 DIAGNOSIS — E669 Obesity, unspecified: Secondary | ICD-10-CM | POA: Diagnosis not present

## 2023-05-08 DIAGNOSIS — F32A Depression, unspecified: Secondary | ICD-10-CM | POA: Diagnosis not present

## 2023-05-08 DIAGNOSIS — S72001D Fracture of unspecified part of neck of right femur, subsequent encounter for closed fracture with routine healing: Secondary | ICD-10-CM | POA: Diagnosis not present

## 2023-05-08 DIAGNOSIS — G894 Chronic pain syndrome: Secondary | ICD-10-CM | POA: Diagnosis not present

## 2023-05-08 DIAGNOSIS — K5900 Constipation, unspecified: Secondary | ICD-10-CM | POA: Diagnosis not present

## 2023-05-08 DIAGNOSIS — C7800 Secondary malignant neoplasm of unspecified lung: Secondary | ICD-10-CM | POA: Diagnosis not present

## 2023-05-08 DIAGNOSIS — E43 Unspecified severe protein-calorie malnutrition: Secondary | ICD-10-CM | POA: Diagnosis not present

## 2023-05-08 DIAGNOSIS — S0292XD Unspecified fracture of facial bones, subsequent encounter for fracture with routine healing: Secondary | ICD-10-CM | POA: Diagnosis not present

## 2023-05-08 DIAGNOSIS — E7849 Other hyperlipidemia: Secondary | ICD-10-CM | POA: Diagnosis not present

## 2023-05-08 DIAGNOSIS — M19071 Primary osteoarthritis, right ankle and foot: Secondary | ICD-10-CM | POA: Diagnosis not present

## 2023-05-08 DIAGNOSIS — M797 Fibromyalgia: Secondary | ICD-10-CM | POA: Diagnosis not present

## 2023-05-08 DIAGNOSIS — L89322 Pressure ulcer of left buttock, stage 2: Secondary | ICD-10-CM | POA: Diagnosis not present

## 2023-05-08 DIAGNOSIS — M170 Bilateral primary osteoarthritis of knee: Secondary | ICD-10-CM | POA: Diagnosis not present

## 2023-05-08 DIAGNOSIS — T451X5S Adverse effect of antineoplastic and immunosuppressive drugs, sequela: Secondary | ICD-10-CM | POA: Diagnosis not present

## 2023-05-08 DIAGNOSIS — M9701XD Periprosthetic fracture around internal prosthetic right hip joint, subsequent encounter: Secondary | ICD-10-CM | POA: Diagnosis not present

## 2023-05-08 DIAGNOSIS — M1612 Unilateral primary osteoarthritis, left hip: Secondary | ICD-10-CM | POA: Diagnosis not present

## 2023-05-08 DIAGNOSIS — W1830XD Fall on same level, unspecified, subsequent encounter: Secondary | ICD-10-CM | POA: Diagnosis not present

## 2023-05-08 DIAGNOSIS — M19072 Primary osteoarthritis, left ankle and foot: Secondary | ICD-10-CM | POA: Diagnosis not present

## 2023-05-08 DIAGNOSIS — S020XXD Fracture of vault of skull, subsequent encounter for fracture with routine healing: Secondary | ICD-10-CM | POA: Diagnosis not present

## 2023-05-08 DIAGNOSIS — D630 Anemia in neoplastic disease: Secondary | ICD-10-CM | POA: Diagnosis not present

## 2023-05-08 DIAGNOSIS — E1140 Type 2 diabetes mellitus with diabetic neuropathy, unspecified: Secondary | ICD-10-CM | POA: Diagnosis not present

## 2023-05-08 DIAGNOSIS — Z6821 Body mass index (BMI) 21.0-21.9, adult: Secondary | ICD-10-CM | POA: Diagnosis not present

## 2023-05-08 DIAGNOSIS — G2581 Restless legs syndrome: Secondary | ICD-10-CM | POA: Diagnosis not present

## 2023-05-08 DIAGNOSIS — K1232 Oral mucositis (ulcerative) due to other drugs: Secondary | ICD-10-CM | POA: Diagnosis not present

## 2023-05-08 DIAGNOSIS — F419 Anxiety disorder, unspecified: Secondary | ICD-10-CM | POA: Diagnosis not present

## 2023-05-11 ENCOUNTER — Emergency Department (HOSPITAL_COMMUNITY)

## 2023-05-11 ENCOUNTER — Encounter (HOSPITAL_COMMUNITY): Payer: Self-pay

## 2023-05-11 ENCOUNTER — Inpatient Hospital Stay (HOSPITAL_COMMUNITY)
Admission: EM | Admit: 2023-05-11 | Discharge: 2023-05-18 | DRG: 871 | Disposition: A | Discharge status: Skilled Nursing Facility | Attending: Family Medicine | Admitting: Family Medicine

## 2023-05-11 ENCOUNTER — Other Ambulatory Visit: Payer: Self-pay

## 2023-05-11 DIAGNOSIS — M9701XA Periprosthetic fracture around internal prosthetic right hip joint, initial encounter: Secondary | ICD-10-CM | POA: Diagnosis present

## 2023-05-11 DIAGNOSIS — S72009D Fracture of unspecified part of neck of unspecified femur, subsequent encounter for closed fracture with routine healing: Secondary | ICD-10-CM | POA: Diagnosis not present

## 2023-05-11 DIAGNOSIS — R1310 Dysphagia, unspecified: Secondary | ICD-10-CM | POA: Diagnosis not present

## 2023-05-11 DIAGNOSIS — F32A Depression, unspecified: Secondary | ICD-10-CM | POA: Diagnosis present

## 2023-05-11 DIAGNOSIS — J9602 Acute respiratory failure with hypercapnia: Secondary | ICD-10-CM | POA: Diagnosis not present

## 2023-05-11 DIAGNOSIS — Z9981 Dependence on supplemental oxygen: Secondary | ICD-10-CM | POA: Diagnosis not present

## 2023-05-11 DIAGNOSIS — M25551 Pain in right hip: Secondary | ICD-10-CM | POA: Diagnosis not present

## 2023-05-11 DIAGNOSIS — E119 Type 2 diabetes mellitus without complications: Secondary | ICD-10-CM | POA: Diagnosis not present

## 2023-05-11 DIAGNOSIS — R296 Repeated falls: Principal | ICD-10-CM | POA: Diagnosis present

## 2023-05-11 DIAGNOSIS — S199XXA Unspecified injury of neck, initial encounter: Secondary | ICD-10-CM | POA: Diagnosis not present

## 2023-05-11 DIAGNOSIS — E1140 Type 2 diabetes mellitus with diabetic neuropathy, unspecified: Secondary | ICD-10-CM | POA: Diagnosis not present

## 2023-05-11 DIAGNOSIS — Z888 Allergy status to other drugs, medicaments and biological substances status: Secondary | ICD-10-CM | POA: Diagnosis not present

## 2023-05-11 DIAGNOSIS — J9601 Acute respiratory failure with hypoxia: Secondary | ICD-10-CM | POA: Diagnosis not present

## 2023-05-11 DIAGNOSIS — M797 Fibromyalgia: Secondary | ICD-10-CM | POA: Diagnosis not present

## 2023-05-11 DIAGNOSIS — N179 Acute kidney failure, unspecified: Secondary | ICD-10-CM | POA: Insufficient documentation

## 2023-05-11 DIAGNOSIS — F419 Anxiety disorder, unspecified: Secondary | ICD-10-CM | POA: Diagnosis present

## 2023-05-11 DIAGNOSIS — Z923 Personal history of irradiation: Secondary | ICD-10-CM

## 2023-05-11 DIAGNOSIS — Z808 Family history of malignant neoplasm of other organs or systems: Secondary | ICD-10-CM

## 2023-05-11 DIAGNOSIS — R9389 Abnormal findings on diagnostic imaging of other specified body structures: Secondary | ICD-10-CM | POA: Diagnosis not present

## 2023-05-11 DIAGNOSIS — R000 Tachycardia, unspecified: Secondary | ICD-10-CM | POA: Diagnosis not present

## 2023-05-11 DIAGNOSIS — Z1611 Resistance to penicillins: Secondary | ICD-10-CM | POA: Diagnosis not present

## 2023-05-11 DIAGNOSIS — R419 Unspecified symptoms and signs involving cognitive functions and awareness: Secondary | ICD-10-CM | POA: Diagnosis not present

## 2023-05-11 DIAGNOSIS — S0285XA Fracture of orbit, unspecified, initial encounter for closed fracture: Secondary | ICD-10-CM | POA: Diagnosis present

## 2023-05-11 DIAGNOSIS — R471 Dysarthria and anarthria: Secondary | ICD-10-CM | POA: Diagnosis not present

## 2023-05-11 DIAGNOSIS — R6521 Severe sepsis with septic shock: Secondary | ICD-10-CM | POA: Diagnosis not present

## 2023-05-11 DIAGNOSIS — S72124A Nondisplaced fracture of lesser trochanter of right femur, initial encounter for closed fracture: Secondary | ICD-10-CM | POA: Diagnosis not present

## 2023-05-11 DIAGNOSIS — R0989 Other specified symptoms and signs involving the circulatory and respiratory systems: Secondary | ICD-10-CM | POA: Diagnosis not present

## 2023-05-11 DIAGNOSIS — S72001D Fracture of unspecified part of neck of right femur, subsequent encounter for closed fracture with routine healing: Secondary | ICD-10-CM | POA: Diagnosis not present

## 2023-05-11 DIAGNOSIS — A4159 Other Gram-negative sepsis: Principal | ICD-10-CM | POA: Diagnosis present

## 2023-05-11 DIAGNOSIS — S72121A Displaced fracture of lesser trochanter of right femur, initial encounter for closed fracture: Secondary | ICD-10-CM | POA: Diagnosis not present

## 2023-05-11 DIAGNOSIS — M6289 Other specified disorders of muscle: Secondary | ICD-10-CM | POA: Diagnosis not present

## 2023-05-11 DIAGNOSIS — W1830XA Fall on same level, unspecified, initial encounter: Secondary | ICD-10-CM | POA: Diagnosis not present

## 2023-05-11 DIAGNOSIS — E1141 Type 2 diabetes mellitus with diabetic mononeuropathy: Secondary | ICD-10-CM | POA: Diagnosis not present

## 2023-05-11 DIAGNOSIS — G2581 Restless legs syndrome: Secondary | ICD-10-CM | POA: Diagnosis not present

## 2023-05-11 DIAGNOSIS — Z83438 Family history of other disorder of lipoprotein metabolism and other lipidemia: Secondary | ICD-10-CM

## 2023-05-11 DIAGNOSIS — R41841 Cognitive communication deficit: Secondary | ICD-10-CM | POA: Diagnosis not present

## 2023-05-11 DIAGNOSIS — Z6821 Body mass index (BMI) 21.0-21.9, adult: Secondary | ICD-10-CM | POA: Diagnosis not present

## 2023-05-11 DIAGNOSIS — W1830XD Fall on same level, unspecified, subsequent encounter: Secondary | ICD-10-CM | POA: Diagnosis not present

## 2023-05-11 DIAGNOSIS — Z85818 Personal history of malignant neoplasm of other sites of lip, oral cavity, and pharynx: Secondary | ICD-10-CM

## 2023-05-11 DIAGNOSIS — Z96641 Presence of right artificial hip joint: Secondary | ICD-10-CM | POA: Diagnosis not present

## 2023-05-11 DIAGNOSIS — G629 Polyneuropathy, unspecified: Secondary | ICD-10-CM | POA: Diagnosis not present

## 2023-05-11 DIAGNOSIS — G928 Other toxic encephalopathy: Secondary | ICD-10-CM | POA: Diagnosis not present

## 2023-05-11 DIAGNOSIS — C098 Malignant neoplasm of overlapping sites of tonsil: Secondary | ICD-10-CM | POA: Diagnosis not present

## 2023-05-11 DIAGNOSIS — N390 Urinary tract infection, site not specified: Secondary | ICD-10-CM | POA: Diagnosis present

## 2023-05-11 DIAGNOSIS — M1612 Unilateral primary osteoarthritis, left hip: Secondary | ICD-10-CM | POA: Diagnosis not present

## 2023-05-11 DIAGNOSIS — M6281 Muscle weakness (generalized): Secondary | ICD-10-CM | POA: Diagnosis not present

## 2023-05-11 DIAGNOSIS — E7849 Other hyperlipidemia: Secondary | ICD-10-CM | POA: Diagnosis not present

## 2023-05-11 DIAGNOSIS — S020XXD Fracture of vault of skull, subsequent encounter for fracture with routine healing: Secondary | ICD-10-CM | POA: Diagnosis not present

## 2023-05-11 DIAGNOSIS — S72126D Nondisplaced fracture of lesser trochanter of unspecified femur, subsequent encounter for closed fracture with routine healing: Secondary | ICD-10-CM | POA: Diagnosis not present

## 2023-05-11 DIAGNOSIS — J9612 Chronic respiratory failure with hypercapnia: Secondary | ICD-10-CM | POA: Diagnosis not present

## 2023-05-11 DIAGNOSIS — T451X5S Adverse effect of antineoplastic and immunosuppressive drugs, sequela: Secondary | ICD-10-CM | POA: Diagnosis not present

## 2023-05-11 DIAGNOSIS — F339 Major depressive disorder, recurrent, unspecified: Secondary | ICD-10-CM | POA: Diagnosis not present

## 2023-05-11 DIAGNOSIS — G9341 Metabolic encephalopathy: Secondary | ICD-10-CM | POA: Diagnosis present

## 2023-05-11 DIAGNOSIS — S0990XA Unspecified injury of head, initial encounter: Secondary | ICD-10-CM | POA: Diagnosis not present

## 2023-05-11 DIAGNOSIS — D630 Anemia in neoplastic disease: Secondary | ICD-10-CM | POA: Diagnosis not present

## 2023-05-11 DIAGNOSIS — N35919 Unspecified urethral stricture, male, unspecified site: Secondary | ICD-10-CM | POA: Diagnosis not present

## 2023-05-11 DIAGNOSIS — Z1152 Encounter for screening for COVID-19: Secondary | ICD-10-CM | POA: Diagnosis not present

## 2023-05-11 DIAGNOSIS — R531 Weakness: Secondary | ICD-10-CM | POA: Diagnosis not present

## 2023-05-11 DIAGNOSIS — G9340 Encephalopathy, unspecified: Secondary | ICD-10-CM | POA: Diagnosis not present

## 2023-05-11 DIAGNOSIS — E785 Hyperlipidemia, unspecified: Secondary | ICD-10-CM | POA: Diagnosis not present

## 2023-05-11 DIAGNOSIS — R2689 Other abnormalities of gait and mobility: Secondary | ICD-10-CM | POA: Diagnosis not present

## 2023-05-11 DIAGNOSIS — N3000 Acute cystitis without hematuria: Secondary | ICD-10-CM | POA: Diagnosis not present

## 2023-05-11 DIAGNOSIS — Z66 Do not resuscitate: Secondary | ICD-10-CM | POA: Diagnosis not present

## 2023-05-11 DIAGNOSIS — E43 Unspecified severe protein-calorie malnutrition: Secondary | ICD-10-CM | POA: Diagnosis not present

## 2023-05-11 DIAGNOSIS — L89322 Pressure ulcer of left buttock, stage 2: Secondary | ICD-10-CM | POA: Diagnosis not present

## 2023-05-11 DIAGNOSIS — J99 Respiratory disorders in diseases classified elsewhere: Secondary | ICD-10-CM | POA: Diagnosis not present

## 2023-05-11 DIAGNOSIS — G894 Chronic pain syndrome: Secondary | ICD-10-CM | POA: Diagnosis not present

## 2023-05-11 DIAGNOSIS — M9701XD Periprosthetic fracture around internal prosthetic right hip joint, subsequent encounter: Secondary | ICD-10-CM | POA: Diagnosis not present

## 2023-05-11 DIAGNOSIS — M170 Bilateral primary osteoarthritis of knee: Secondary | ICD-10-CM | POA: Diagnosis not present

## 2023-05-11 DIAGNOSIS — R0602 Shortness of breath: Secondary | ICD-10-CM | POA: Diagnosis not present

## 2023-05-11 DIAGNOSIS — M19072 Primary osteoarthritis, left ankle and foot: Secondary | ICD-10-CM | POA: Diagnosis not present

## 2023-05-11 DIAGNOSIS — J690 Pneumonitis due to inhalation of food and vomit: Secondary | ICD-10-CM | POA: Diagnosis not present

## 2023-05-11 DIAGNOSIS — A419 Sepsis, unspecified organism: Secondary | ICD-10-CM | POA: Diagnosis not present

## 2023-05-11 DIAGNOSIS — M19071 Primary osteoarthritis, right ankle and foot: Secondary | ICD-10-CM | POA: Diagnosis not present

## 2023-05-11 DIAGNOSIS — K1232 Oral mucositis (ulcerative) due to other drugs: Secondary | ICD-10-CM | POA: Diagnosis not present

## 2023-05-11 DIAGNOSIS — Y929 Unspecified place or not applicable: Secondary | ICD-10-CM

## 2023-05-11 DIAGNOSIS — E441 Mild protein-calorie malnutrition: Secondary | ICD-10-CM | POA: Diagnosis not present

## 2023-05-11 DIAGNOSIS — R0902 Hypoxemia: Secondary | ICD-10-CM | POA: Diagnosis not present

## 2023-05-11 DIAGNOSIS — R6520 Severe sepsis without septic shock: Secondary | ICD-10-CM | POA: Diagnosis not present

## 2023-05-11 DIAGNOSIS — Z85118 Personal history of other malignant neoplasm of bronchus and lung: Secondary | ICD-10-CM

## 2023-05-11 DIAGNOSIS — E669 Obesity, unspecified: Secondary | ICD-10-CM | POA: Diagnosis not present

## 2023-05-11 DIAGNOSIS — S72121D Displaced fracture of lesser trochanter of right femur, subsequent encounter for closed fracture with routine healing: Secondary | ICD-10-CM | POA: Diagnosis not present

## 2023-05-11 DIAGNOSIS — C7800 Secondary malignant neoplasm of unspecified lung: Secondary | ICD-10-CM | POA: Diagnosis not present

## 2023-05-11 DIAGNOSIS — R1312 Dysphagia, oropharyngeal phase: Secondary | ICD-10-CM | POA: Diagnosis not present

## 2023-05-11 DIAGNOSIS — Z806 Family history of leukemia: Secondary | ICD-10-CM

## 2023-05-11 DIAGNOSIS — S0292XD Unspecified fracture of facial bones, subsequent encounter for fracture with routine healing: Secondary | ICD-10-CM | POA: Diagnosis not present

## 2023-05-11 DIAGNOSIS — Z79899 Other long term (current) drug therapy: Secondary | ICD-10-CM

## 2023-05-11 DIAGNOSIS — R278 Other lack of coordination: Secondary | ICD-10-CM | POA: Diagnosis not present

## 2023-05-11 DIAGNOSIS — R918 Other nonspecific abnormal finding of lung field: Secondary | ICD-10-CM | POA: Diagnosis not present

## 2023-05-11 DIAGNOSIS — K5900 Constipation, unspecified: Secondary | ICD-10-CM | POA: Diagnosis not present

## 2023-05-11 DIAGNOSIS — Z9221 Personal history of antineoplastic chemotherapy: Secondary | ICD-10-CM

## 2023-05-11 DIAGNOSIS — R0600 Dyspnea, unspecified: Secondary | ICD-10-CM | POA: Diagnosis not present

## 2023-05-11 LAB — COMPREHENSIVE METABOLIC PANEL WITH GFR
ALT: 12 U/L (ref 0–44)
AST: 20 U/L (ref 15–41)
Albumin: 3.8 g/dL (ref 3.5–5.0)
Alkaline Phosphatase: 105 U/L (ref 38–126)
Anion gap: 9 (ref 5–15)
BUN: 32 mg/dL — ABNORMAL HIGH (ref 8–23)
CO2: 28 mmol/L (ref 22–32)
Calcium: 9.3 mg/dL (ref 8.9–10.3)
Chloride: 98 mmol/L (ref 98–111)
Creatinine, Ser: 1.43 mg/dL — ABNORMAL HIGH (ref 0.61–1.24)
GFR, Estimated: 52 mL/min — ABNORMAL LOW (ref >60)
Glucose, Bld: 102 mg/dL — ABNORMAL HIGH (ref 70–99)
Potassium: 3.6 mmol/L (ref 3.5–5.1)
Sodium: 135 mmol/L (ref 135–145)
Total Bilirubin: 0.7 mg/dL (ref 0.0–1.2)
Total Protein: 7.2 g/dL (ref 6.5–8.1)

## 2023-05-11 LAB — CBC WITH DIFFERENTIAL/PLATELET
Abs Immature Granulocytes: 0.02 10*3/uL (ref 0.00–0.07)
Basophils Absolute: 0 10*3/uL (ref 0.0–0.1)
Basophils Relative: 1 %
Eosinophils Absolute: 0.1 10*3/uL (ref 0.0–0.5)
Eosinophils Relative: 2 %
HCT: 36.1 % — ABNORMAL LOW (ref 39.0–52.0)
Hemoglobin: 11.4 g/dL — ABNORMAL LOW (ref 13.0–17.0)
Immature Granulocytes: 0 %
Lymphocytes Relative: 9 %
Lymphs Abs: 0.5 10*3/uL — ABNORMAL LOW (ref 0.7–4.0)
MCH: 29.8 pg (ref 26.0–34.0)
MCHC: 31.6 g/dL (ref 30.0–36.0)
MCV: 94.3 fL (ref 80.0–100.0)
Monocytes Absolute: 0.4 10*3/uL (ref 0.1–1.0)
Monocytes Relative: 8 %
Neutro Abs: 4.2 10*3/uL (ref 1.7–7.7)
Neutrophils Relative %: 80 %
Platelets: 156 10*3/uL (ref 150–400)
RBC: 3.83 MIL/uL — ABNORMAL LOW (ref 4.22–5.81)
RDW: 14.9 % (ref 11.5–15.5)
WBC: 5.3 10*3/uL (ref 4.0–10.5)
nRBC: 0 % (ref 0.0–0.2)

## 2023-05-11 LAB — BLOOD GAS, VENOUS
Acid-Base Excess: 4.4 mmol/L — ABNORMAL HIGH (ref 0.0–2.0)
Acid-Base Excess: 2.4 mmol/L — ABNORMAL HIGH (ref 0.0–2.0)
Bicarbonate: 32.7 mmol/L — ABNORMAL HIGH (ref 20.0–28.0)
Bicarbonate: 30.2 mmol/L — ABNORMAL HIGH (ref 20.0–28.0)
Drawn by: 66297
Drawn by: 53361
O2 Saturation: 26.9 %
O2 Saturation: 59.7 %
Patient temperature: 36.7
Patient temperature: 37.6
pCO2, Ven: 64 mmHg — ABNORMAL HIGH (ref 44–60)
pCO2, Ven: 61 mmHg — ABNORMAL HIGH (ref 44–60)
pH, Ven: 7.31 (ref 7.25–7.43)
pH, Ven: 7.3 (ref 7.25–7.43)
pO2, Ven: <31 mmHg — CL (ref 32–45)
pO2, Ven: <31 mmHg — CL (ref 32–45)

## 2023-05-11 LAB — URINALYSIS, W/ REFLEX TO CULTURE (INFECTION SUSPECTED)
Bilirubin Urine: NEGATIVE
Glucose, UA: NEGATIVE mg/dL
Hgb urine dipstick: NEGATIVE
Ketones, ur: 5 mg/dL — AB
Nitrite: NEGATIVE
Protein, ur: 100 mg/dL — AB
Specific Gravity, Urine: 1.019 (ref 1.005–1.030)
WBC, UA: >50 WBC/hpf (ref 0–5)
pH: 6 (ref 5.0–8.0)

## 2023-05-11 LAB — ETHANOL: Alcohol, Ethyl (B): <15 mg/dL (ref <15)

## 2023-05-11 LAB — LACTIC ACID, PLASMA
Lactic Acid, Venous: 1.4 mmol/L (ref 0.5–1.9)
Lactic Acid, Venous: 1.4 mmol/L (ref 0.5–1.9)

## 2023-05-11 LAB — PROTIME-INR
INR: 1.2 (ref 0.8–1.2)
Prothrombin Time: 15 s (ref 11.4–15.2)

## 2023-05-11 LAB — RAPID URINE DRUG SCREEN, HOSP PERFORMED
Amphetamines: NOT DETECTED
Barbiturates: NOT DETECTED
Benzodiazepines: NOT DETECTED
Cocaine: NOT DETECTED
Opiates: NOT DETECTED
Tetrahydrocannabinol: NOT DETECTED

## 2023-05-11 LAB — AMMONIA: Ammonia: 20 umol/L (ref 9–35)

## 2023-05-11 LAB — RESP PANEL BY RT-PCR (RSV, FLU A&B, COVID)  RVPGX2
Influenza A by PCR: NEGATIVE
Influenza B by PCR: NEGATIVE
Resp Syncytial Virus by PCR: NEGATIVE
SARS Coronavirus 2 by RT PCR: NEGATIVE

## 2023-05-11 LAB — BRAIN NATRIURETIC PEPTIDE: B Natriuretic Peptide: 23 pg/mL (ref 0.0–100.0)

## 2023-05-11 LAB — CBG MONITORING, ED: Glucose-Capillary: 98 mg/dL (ref 70–99)

## 2023-05-11 MED ORDER — VANCOMYCIN HCL 1500 MG/300ML IV SOLN: 1500.0000 mg | INTRAVENOUS | Status: DC

## 2023-05-11 MED ORDER — SODIUM CHLORIDE 0.9 % IV BOLUS
1000.0000 mL | Freq: Once | INTRAVENOUS | Status: AC
  Administered 2023-05-11: 1000 mL via INTRAVENOUS

## 2023-05-11 MED ORDER — OXYCODONE HCL 5 MG PO TABS: 2.5000 mg | ORAL_TABLET | Freq: Four times a day (QID) | ORAL | Status: DC | PRN

## 2023-05-11 MED ORDER — ROSUVASTATIN CALCIUM 20 MG PO TABS
10.0000 mg | ORAL_TABLET | Freq: Every day | ORAL | Status: DC
  Administered 2023-05-12 – 2023-05-17 (×6): 10 mg via ORAL
  Filled 2023-05-11 (×6): qty 1

## 2023-05-11 MED ORDER — SODIUM CHLORIDE 0.9 % IV SOLN
1.0000 g | Freq: Once | INTRAVENOUS | Status: AC
  Administered 2023-05-11: 1 g via INTRAVENOUS
  Filled 2023-05-11: qty 10

## 2023-05-11 MED ORDER — SODIUM CHLORIDE 0.9 % IV SOLN: 1.0000 g | INTRAVENOUS | Status: DC

## 2023-05-11 MED ORDER — SODIUM CHLORIDE 0.9 % IV SOLN
250.0000 mL | INTRAVENOUS | Status: AC
  Administered 2023-05-12: 250 mL via INTRAVENOUS

## 2023-05-11 MED ORDER — VANCOMYCIN HCL 2000 MG/400ML IV SOLN
2000.0000 mg | Freq: Once | INTRAVENOUS | Status: AC
  Administered 2023-05-11: 2000 mg via INTRAVENOUS
  Filled 2023-05-11: qty 400

## 2023-05-11 MED ORDER — SODIUM CHLORIDE 0.9 % IV SOLN: 2.0000 g | INTRAVENOUS | Status: DC

## 2023-05-11 MED ORDER — SODIUM CHLORIDE 0.9 % IV BOLUS (SEPSIS)
1000.0000 mL | Freq: Once | INTRAVENOUS | Status: AC
  Administered 2023-05-11: 1000 mL via INTRAVENOUS

## 2023-05-11 MED ORDER — OXYCODONE HCL 5 MG PO TABS: 5.0000 mg | ORAL_TABLET | Freq: Four times a day (QID) | ORAL | Status: DC | PRN

## 2023-05-11 MED ORDER — LORAZEPAM 0.5 MG PO TABS: 0.5000 mg | ORAL_TABLET | Freq: Every evening | ORAL | Status: DC | PRN

## 2023-05-11 MED ORDER — SODIUM CHLORIDE 0.9% FLUSH
3.0000 mL | Freq: Two times a day (BID) | INTRAVENOUS | Status: DC
  Administered 2023-05-12 (×2): 3 mL via INTRAVENOUS
  Administered 2023-05-13: 10 mL via INTRAVENOUS
  Administered 2023-05-13 – 2023-05-18 (×8): 3 mL via INTRAVENOUS

## 2023-05-11 MED ORDER — PIPERACILLIN-TAZOBACTAM 3.375 G IVPB
3.3750 g | Freq: Three times a day (TID) | INTRAVENOUS | Status: DC
  Administered 2023-05-11 – 2023-05-12 (×3): 3.375 g via INTRAVENOUS
  Filled 2023-05-11 (×3): qty 50

## 2023-05-11 MED ORDER — SODIUM CHLORIDE 0.9 % IV SOLN
1.0000 g | Freq: Once | INTRAVENOUS | Status: DC
  Administered 2023-05-11: 1 g via INTRAVENOUS
  Filled 2023-05-11: qty 10

## 2023-05-11 MED ORDER — ALPRAZOLAM 0.5 MG PO TABS
1.0000 mg | ORAL_TABLET | Freq: Once | ORAL | Status: AC
  Administered 2023-05-11: 1 mg via ORAL
  Filled 2023-05-11: qty 2

## 2023-05-11 MED ORDER — LACTATED RINGERS IV BOLUS: 1000.0000 mL | Freq: Once | INTRAVENOUS | Status: DC

## 2023-05-11 MED ORDER — MELATONIN 3 MG PO TABS
6.0000 mg | ORAL_TABLET | Freq: Every evening | ORAL | Status: DC | PRN
  Administered 2023-05-12: 6 mg via ORAL
  Filled 2023-05-11: qty 2

## 2023-05-11 MED ORDER — ALBUTEROL SULFATE (2.5 MG/3ML) 0.083% IN NEBU
2.5000 mg | INHALATION_SOLUTION | RESPIRATORY_TRACT | Status: DC | PRN
Start: 2023-05-11 — End: 2023-05-18

## 2023-05-11 MED ORDER — POLYETHYLENE GLYCOL 3350 17 G PO PACK
17.0000 g | PACK | Freq: Every day | ORAL | Status: DC | PRN
Start: 2023-05-11 — End: 2023-05-18

## 2023-05-11 MED ORDER — ACETAMINOPHEN 500 MG PO TABS
1000.0000 mg | ORAL_TABLET | Freq: Four times a day (QID) | ORAL | Status: DC | PRN
Start: 2023-05-11 — End: 2023-05-18
  Administered 2023-05-12 (×3): 1000 mg via ORAL
  Filled 2023-05-11 (×3): qty 2

## 2023-05-11 MED ORDER — NOREPINEPHRINE 4 MG/250ML-% IV SOLN
2.0000 ug/min | INTRAVENOUS | Status: DC
  Administered 2023-05-11: 2 ug/min via INTRAVENOUS
  Administered 2023-05-12: 5 ug/min via INTRAVENOUS
  Filled 2023-05-11 (×2): qty 250

## 2023-05-11 MED ORDER — ALPRAZOLAM 0.5 MG PO TABS: 1.0000 mg | ORAL_TABLET | Freq: Every evening | ORAL | Status: DC | PRN

## 2023-05-11 MED ORDER — ACETAMINOPHEN 500 MG PO TABS
1000.0000 mg | ORAL_TABLET | Freq: Once | ORAL | Status: AC
  Administered 2023-05-11: 1000 mg via ORAL
  Filled 2023-05-11: qty 2

## 2023-05-11 NOTE — ED Notes (Signed)
 Hospitilast made aware of low BP readings. Fluid orders added by provider

## 2023-05-11 NOTE — ED Notes (Signed)
 MD made aware that pt will need sepsis orders. Verbal order given at this time and lab at bedside for cultures.

## 2023-05-11 NOTE — ED Notes (Signed)
 Pt HR now in 130s. Pt took off O2. Sats round to be 82%. Pt has a rectal temp of 100.6  MD made aware of findings.
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
## 2023-05-11 NOTE — Consult Note (Signed)
 Pharmacy Antibiotic Note  [name] is a 73 y.o. male admitted on 05/11/2023 with sepsis.  Pharmacy has been consulted for Vancomycin  dosing.  Plan: Vancomycin  2000mg  IV x 1 LD (24mg /kg),  Vancomycin  1500 mg IV Q 24 hrs.  Goal AUC 400-550  Expected AUC: 494.9 SCr used: 1.43   Height: 6\' 3"  (190.5 cm) Weight: 83 kg (182 lb 15.7 oz) IBW/kg (Calculated) : 84.5  Temp (24hrs), Avg:99.1 F (37.3 C), Min:97.6 F (36.4 C), Max:100.6 F (38.1 C)  Recent Labs  Lab 05/11/23 1500 05/11/23 1850 05/11/23 2027  WBC 5.3  --   --   CREATININE 1.43*  --   --   LATICACIDVEN  --  1.4 1.4    Estimated Creatinine Clearance: 54.8 mL/min (A) (by C-G formula based on SCr of 1.43 mg/dL (H)).    Allergies  Allergen Reactions   Neurontin [Gabapentin] Other (See Comments)    Causes seizures   Buspirone Other (See Comments)    Lips swelling    Dilaudid  [Hydromorphone  Hcl] Other (See Comments)    Oxygen saturation drops    Antimicrobials this admission: Zosyn  4/28 >>  Vancomycin  4/28 >>   Dose adjustments this admission: n/a  Microbiology results:   Thank you for allowing pharmacy to be a part of this patient's care.   Charm Stenner Rodriguez-Guzman PharmD, BCPS 05/11/2023 10:10 PM

## 2023-05-11 NOTE — ED Notes (Signed)
 Hospitalist at bedside

## 2023-05-11 NOTE — ED Provider Notes (Signed)
 [place] EMERGENCY DEPARTMENT AT [facility] Provider Note   CSN: [id number] Arrival date & time: 05/11/23  1452     History  Chief Complaint  Patient presents with   [name] is a 73 y.o. male with metastatic tonsillar cancer, T2DM, G-tube in place, who presents with recurrent falls.  Patient states that he normally does not fall currently and this is demonstrated in chart review.  He states over the last 3 days it has gotten acutely worse.  States that he feels like he is "all over the place" when he gets up and tries to walk around. he feels like his balance has gotten significantly worse acutely.  He denies any chest pain, shortness of breath, lightheadedness, visual changes, speech changes, asymmetric weakness, numbness tingling, urinary symptoms, nausea vomiting diarrhea.  States he is eating and drinking well.  Denies any sick contacts or flulike symptoms.  Endorses pain in his right hip from a prior fall but denies any other head trauma, loss of consciousness or new pain as a result of the falls recently. Per chart review patient was recently admitted from 03/24/2023 to 03/30/2023 after a fall resulting in a right frontal calvarium fracture, facial fractures, and right hip periprosthetic fracture.  Complicated by right lower lobe pneumonia.  Reported unsteady gait with frequent falls at home, uses a cane, lives by himself, recommended a rolling walker.  Patient states he self caths 3 times a day approximately, uses a new catheter every time.   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 Authorizing Provider  acetaminophen  (TYLENOL ) 500 MG tablet Take 2 tablets (1,000 mg total) by mouth every 6 (six) hours as needed. 03/30/23   Marlin Simmonds, PA-C  alprazolam  (XANAX ) 2 MG tablet Take 2 mg by mouth 3 (three) times daily as needed for sleep. 08/02/12   [provider]  citalopram  (CELEXA ) 40 MG tablet Take 1 tablet by mouth daily. 02/18/17   [provider]  rosuvastatin  (CRESTOR ) 10 MG tablet Take 10 mg by mouth at bedtime. 03/15/23   [provider]  traMADol  (ULTRAM ) 50 MG tablet Take 1-2 tablets (50-100 mg total) by mouth every 6 (six) hours as needed for moderate pain (pain score 4-6) or severe pain (pain score 7-10) (50mg  moderate, 100mg  severe pain). 03/30/23   Marlin Simmonds, PA-C      Allergies    Neurontin [gabapentin], Buspirone, and Dilaudid  [hydromorphone  hcl]    Review of Systems   Review of Systems A 10 point review of systems was performed and is negative unless otherwise reported in HPI.  Physical Exam Updated Vital  Signs BP 119/66 (BP Location: Right Arm)   Pulse 90   Temp 97.6 F (36.4 C) (Oral)   Resp 16   Ht 6\' 3"  (1.905 m)   Wt 83 kg   SpO2 91%   BMI 22.87 kg/m  Physical Exam General: Chronically ill appearing elderly male, lying in bed.  HEENT: NCAT, stable forehead midface and nasal bridge, PERRLA, EOMI, sclera anicteric, edentulous, dry mucous membranes, trachea midline.  No midline spine tenderness palpation. Cardiology: RRR, no murmurs/rubs/gallops. BL radial and DP pulses equal bilaterally.  Resp: Normal respiratory rate and effort.  Satting 90 to 92% on room air.  Coarse lung sounds  bilaterally. Abd: Soft, non-tender, non-distended. No rebound tenderness or guarding.  Pelvis: Pelvis stable and nontender. MSK: No peripheral edema or signs of trauma. Extremities without deformity.  Mild tenderness to palpation to the right greater trochanter. Skin: warm, dry.  Back: No CVA tenderness.  No midline T or L-spine tenderness to palpation. Neuro: A&Ox4, CNs II-XII grossly intact.  4 out of 5 strength in all extremities.. Sensation grossly intact.  Difficult to understand edentulous speech, however patient states this is baseline. Psych: Normal mood and affect.   ED Results / Procedures / Treatments   Labs (all labs ordered are listed, but only abnormal results are displayed) Labs Reviewed  CBC WITH DIFFERENTIAL/PLATELET - Abnormal; Notable for the following components:      Result Value   RBC 3.83 (*)    Hemoglobin 11.4 (*)    HCT 36.1 (*)    Lymphs Abs 0.5 (*)    All other components within normal limits  COMPREHENSIVE METABOLIC PANEL WITH GFR - Abnormal; Notable for the following components:   Glucose, Bld 102 (*)    BUN 32 (*)    Creatinine, Ser 1.43 (*)    GFR, Estimated 52 (*)    All other components within normal limits  URINALYSIS, W/ REFLEX TO CULTURE (INFECTION SUSPECTED) - Abnormal; Notable for the following components:   APPearance HAZY (*)    Ketones, ur 5 (*)    Protein, ur 100 (*)    Leukocytes,Ua LARGE (*)    Bacteria, UA MANY (*)    All other components within normal limits  BLOOD GAS, VENOUS - Abnormal; Notable for the following components:   pCO2, Ven 64 (*)    pO2, Ven <31 (*)    Bicarbonate 32.7 (*)    Acid-Base Excess 4.4 (*)    All other components within normal limits  RESP PANEL BY RT-PCR (RSV, FLU A&B, COVID)  RVPGX2  CULTURE, BLOOD (ROUTINE X 2)  CULTURE, BLOOD (ROUTINE X 2)  URINE CULTURE  BRAIN NATRIURETIC PEPTIDE  LACTIC ACID, PLASMA  LACTIC ACID, PLASMA  PROTIME-INR  RAPID URINE DRUG SCREEN, HOSP PERFORMED  ETHANOL  AMMONIA   BLOOD GAS, VENOUS  TSH  VITAMIN B12  BLOOD GAS, VENOUS  CBG MONITORING, ED    EKG EKG Interpretation Date/Time:  Monday May 11 2023 15:09:48 EDT Ventricular Rate:  92 PR Interval:  151 QRS Duration:  104 QT Interval:  409 QTC Calculation: 506 R Axis:   78  Text Interpretation: Sinus rhythm RSR' in V1 or V2, probably normal variant ST elevation, consider inferior injury Prolonged QT interval Similar to prior Confirmed by Annita Kindle [phone]) on 05/11/2023 4:43:44 PM  Radiology CT Head Wo Contrast Result Date: 05/11/2023 CLINICAL DATA:  Head trauma, minor (Age >= 65y); Neck trauma (Age >= 65y). EXAM: CT HEAD WITHOUT CONTRAST CT CERVICAL SPINE WITHOUT CONTRAST TECHNIQUE:  Multidetector CT imaging of the head and cervical spine was performed following the standard protocol without intravenous contrast. Multiplanar CT image reconstructions of the cervical spine were also generated. RADIATION DOSE REDUCTION: This exam was performed according to the departmental dose-optimization program which includes automated exposure control, adjustment of the mA and/or kV according to patient size and/or use of iterative reconstruction technique. COMPARISON:  CT head, maxillofacial, and cervical spine 03/24/2023. FINDINGS: CT HEAD FINDINGS Brain: There is no evidence of an acute infarct, intracranial hemorrhage, mass, midline shift, or extra-axial fluid collection. Mild cerebral atrophy is within normal limits for age. Hypodensities in the cerebral white matter are unchanged and nonspecific but compatible with mild-to-moderate chronic small vessel ischemic disease. Vascular: Calcified atherosclerosis at the skull base. No hyperdense vessel. Skull: Fractures of the right orbit, maxillary sinus, and zygomatic arch as described on last month's studies. No new fracture. Sinuses/Orbits: Resolved right orbital emphysema and proptosis. Improved aeration of the right maxillary sinus. Clear mastoid air cells. Other:  None. CT CERVICAL SPINE FINDINGS Alignment: No acute traumatic malalignment. Skull base and vertebrae: No acute fracture or suspicious lesion. Soft tissues and spinal canal: No prevertebral fluid or swelling. No visible canal hematoma. Disc levels: C6-7 vertebral body ankylosis. Advanced disc degeneration at C5-6. Advanced facet arthrosis bilaterally at C3-4 and on the left at C4-5. Upper chest: Partially visualized biapical pleuroparenchymal lung scarring including a nodular component on the left, present since at least 2019. Other: None. IMPRESSION: 1. No evidence of acute intracranial abnormality or acute cervical spine fracture. 2. Right-sided zygomaticomaxillary complex fractures as described on last month's CTs. Electronically Signed   By: Aundra Lee M.D.   On: 05/11/2023 20:49   CT Cervical Spine Wo Contrast Result Date: 05/11/2023 CLINICAL DATA:  Head trauma, minor (Age >= 65y); Neck trauma (Age >= 65y). EXAM: CT HEAD WITHOUT CONTRAST CT CERVICAL SPINE WITHOUT CONTRAST TECHNIQUE: Multidetector CT imaging of the head and cervical spine was performed following the standard protocol without intravenous contrast. Multiplanar CT image reconstructions of the cervical spine were also generated. RADIATION DOSE REDUCTION: This exam was performed according to the departmental dose-optimization program which includes automated exposure control, adjustment of the mA and/or kV according to patient size and/or use of iterative reconstruction technique. COMPARISON:  CT head, maxillofacial, and cervical spine 03/24/2023. FINDINGS: CT HEAD FINDINGS Brain: There is no evidence of an acute infarct, intracranial hemorrhage, mass, midline shift, or extra-axial fluid collection. Mild cerebral atrophy is within normal limits for age. Hypodensities in the cerebral white matter are unchanged and nonspecific but compatible with mild-to-moderate chronic small vessel ischemic disease. Vascular: Calcified atherosclerosis at the  skull base. No hyperdense vessel. Skull: Fractures of the right orbit, maxillary sinus, and zygomatic arch as described on last month's studies. No new fracture. Sinuses/Orbits: Resolved right orbital emphysema and proptosis. Improved aeration of the right maxillary sinus. Clear mastoid air cells. Other: None. CT CERVICAL SPINE FINDINGS Alignment: No acute traumatic malalignment. Skull base and vertebrae: No acute fracture or suspicious lesion. Soft tissues and spinal canal: No prevertebral fluid or swelling. No visible canal hematoma. Disc levels: C6-7 vertebral body ankylosis. Advanced disc degeneration at C5-6. Advanced facet arthrosis bilaterally at C3-4 and on the left at C4-5. Upper chest: Partially visualized biapical pleuroparenchymal lung scarring including a nodular component on the left, present since at least 2019. Other: None. IMPRESSION: 1. No evidence of acute intracranial abnormality or acute cervical spine fracture. 2. Right-sided zygomaticomaxillary complex fractures as described on last month's CTs. Electronically  Signed   By: Aundra Lee M.D.   On: 05/11/2023 20:49   DG Pelvis 1-2 Views Result Date: 05/11/2023 CLINICAL DATA:  Marvell Slider, right hip pain, recent periprosthetic right hip fracture EXAM: PELVIS - 1-2 VIEW COMPARISON:  03/24/2023, 03/25/2023 FINDINGS: Frontal view of the pelvis includes both hips. Right hip arthroplasty is identified, with distal margin of the femoral component excluded by collimation. Periosteal reaction is seen along the site of prior periprosthetic fracture in the lateral proximal right femur. Since the previous exam, a nondisplaced fracture is identified at the lesser trochanter, with no callus formation, which may be related to acute trauma. No other acute bony abnormalities. Stable left hip osteoarthritis. Multiple bladder calculi are again noted. IMPRESSION: 1. Interval nondisplaced fracture through the lesser trochanter of the proximal right femur, with no callus  formation, likely related to acute trauma. 2. Healing prior periprosthetic fracture through the lateral aspect of the proximal right femur, with moderate callus formation since prior study. Electronically Signed   By: Bobbye Burrow M.D.   On: 05/11/2023 17:01   DG Chest 2 View Result Date: 05/11/2023 CLINICAL DATA:  Marvell Slider, right hip pain, hypoxia EXAM: CHEST - 2 VIEW COMPARISON:  03/24/2023, 03/25/2023 FINDINGS: Frontal and lateral views of the chest demonstrates stable right chest wall port. The cardiac silhouette is unremarkable. Chronic pulmonary vascular congestion without acute airspace disease, effusion, or pneumothorax. No acute bony abnormalities. IMPRESSION: 1. No acute intrathoracic process. Electronically Signed   By: Bobbye Burrow M.D.   On: 05/11/2023 16:59    Procedures .Critical Care  Performed by: Merdis Stalling, MD Authorized by: Merdis Stalling, MD   Critical care provider statement:    Critical care time (minutes):  35   Critical care was necessary to treat or prevent imminent or life-threatening deterioration of the following conditions:  Sepsis   Critical care was time spent personally by me on the following activities:  Development of treatment plan with patient or surrogate, discussions with consultants, evaluation of patient's response to treatment, examination of patient, ordering and review of laboratory studies, ordering and review of radiographic studies, ordering and performing treatments and interventions, pulse oximetry, re-evaluation of patient's condition, review of old charts and obtaining history from patient or surrogate   Care discussed with: admitting provider       Medications Ordered in ED Medications  ALPRAZolam  (XANAX ) tablet 1 mg (has no administration in time range)  acetaminophen  (TYLENOL ) tablet 1,000 mg (1,000 mg Oral Given 05/11/23 1919)  cefTRIAXone  (ROCEPHIN ) 1 g in sodium chloride  0.9 % 100 mL IVPB (0 g Intravenous Stopped 05/11/23 1956)   sodium chloride  0.9 % bolus 1,000 mL (0 mLs Intravenous Stopped 05/11/23 2105)  ALPRAZolam  (XANAX ) tablet 1 mg (1 mg Oral Given 05/11/23 2019)  sodium chloride  0.9 % bolus 1,000 mL (1,000 mLs Intravenous New Bag/Given 05/11/23 2143)    ED Course/ Medical Decision Making/ A&P                          Medical Decision Making Amount and/or Complexity of Data Reviewed Labs: ordered. Decision-making details documented in ED Course. Radiology: ordered. Decision-making details documented in ED Course.  Risk OTC drugs. Prescription drug management. Decision regarding hospitalization.    This patient presents to the ED for concern of recurrent falls, this involves an extensive number of treatment options, and is a complaint that carries with it a high risk of complications and morbidity.  I considered the following  differential and admission for this acute, potentially life threatening condition.   MDM:    DDX for generalized weakness includes but is not limited to:  Consider infectious processes such as PNA or UTI and ultimately patient is found to have UTI on UA. He does self cath which is likely the cause. He has +AKI as well but no flank pain or abd pain to indicate infected stone. Patient with course lung sounds bilaterally.  Recently treated for pneumonia in the hospital in March.  Satting 90-92% on room air.  Concern for viral infection versus recurrent pneumonia.  Denies any chest pain to indicate ACS, EKG is nonischemic, no arrhythmia on telemetry or EKG.  Clinical Course as of 05/11/23 2330  Mon May 11, 2023  1610 pCO2, Ven(!): 64 Mild compensated resp acidosis [HN]  1610 Creatinine(!): 1.43 +AKI, baseline 1.0-1.1 [HN]  1610 Desatted to 82% on RA. Placed on 3L [place] [HN]  1611 Hemoglobin(!): 11.4 Increased from 10.3 one month ago, query volume contraction. [HN]  1611 WBC: 5.3 No leukocytosis or left shift [HN]  1731 DG Chest 2 View 1. No acute intrathoracic process. [HN]  1732  DG Pelvis 1-2 Views 1. Interval nondisplaced fracture through the lesser trochanter of the proximal right femur, with no callus formation, likely related to acute trauma. 2. Healing prior periprosthetic fracture through the lateral aspect of the proximal right femur, with moderate callus formation since prior study.   [HN]  1742 Urinalysis, w/ Reflex to Culture (Infection Suspected) -Urine, Catheterized(!) +UTI, will give IV ceftriaxone , culture in progress [HN]  1742 Consulted to ortho, will admit to medicine [HN]  1858 D/w Dr. Ernesta Heading who will see patient in hospital. Prior periprosthetic fx lateral proximal femur treated w/ non-op and WBAT with RW.  [HN]  1859 Patient with fever/tachycardia now with sepsis. Blood cultures/lactate ordered, fluids ordered.  [HN]    Clinical Course User Index [HN] Merdis Stalling, MD    Labs: I Ordered, and personally interpreted labs.  The pertinent results include:  those listed above  Imaging Studies ordered: I ordered imaging studies including CXR, pelvic XR I independently visualized and interpreted imaging. I agree with the radiologist interpretation  Additional history obtained from chart review.    Cardiac Monitoring: The patient was maintained on a cardiac monitor.  I personally viewed and interpreted the cardiac monitored which showed an underlying rhythm of: NSR  Reevaluation: After the interventions noted above, I reevaluated the patient and found that they have :improved  Social Determinants of Health: Lives independently  Disposition:  Admitted to medicine  Co morbidities that complicate the patient evaluation  Past Medical History:  Diagnosis Date   Allergy    Anxiety    Arthritis    knees, HIps, Hands   Chest pain    10/14   Complication of anesthesia    bleeding during intubation 04/04/13 due to friability of right tonsillar cancer   Constipation    Depression    Fibromyalgia    Lung cancer (HCC)    Neuropathy     compression neuropathy right hip;s/p replacement   PEG (percutaneous endoscopic gastrostomy) status (HCC)    Pneumonia 01/13/2010   S/P radiation therapy 05/02/2013-06/22/2013   70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  I have reviewed the patients home medicines and have made adjustments as needed  Problem List / ED Course: Problem List Items Addressed This Visit       Genitourinary   Urinary tract infection   Relevant Medications   meropenem  (MERREM ) 1 g in sodium chloride  0.9 % 100 mL IVPB   AKI (acute kidney injury) (HCC)   Other Visit Diagnoses       Recurrent falls    -  Primary     Sepsis with encephalopathy and septic shock, due to unspecified organism (HCC)       Relevant Medications   cefTRIAXone  (ROCEPHIN ) 1 g in sodium chloride  0.9 % 100 mL IVPB (Completed)   vancomycin  (VANCOREADY) IVPB 2000 mg/400 mL (Completed)   meropenem  (MERREM ) 1 g in sodium chloride  0.9 % 100 mL IVPB                   This note was created using dictation software, which may contain spelling or grammatical errors.    Merdis Stalling, MD 05/14/23 [phone]

## 2023-05-12 ENCOUNTER — Inpatient Hospital Stay (HOSPITAL_COMMUNITY)

## 2023-05-12 DIAGNOSIS — W1830XA Fall on same level, unspecified, initial encounter: Secondary | ICD-10-CM | POA: Diagnosis not present

## 2023-05-12 DIAGNOSIS — A419 Sepsis, unspecified organism: Secondary | ICD-10-CM | POA: Diagnosis not present

## 2023-05-12 DIAGNOSIS — S72124A Nondisplaced fracture of lesser trochanter of right femur, initial encounter for closed fracture: Secondary | ICD-10-CM | POA: Diagnosis not present

## 2023-05-12 LAB — CBC
HCT: 35.7 % — ABNORMAL LOW (ref 39.0–52.0)
Hemoglobin: 11.3 g/dL — ABNORMAL LOW (ref 13.0–17.0)
MCH: 29.7 pg (ref 26.0–34.0)
MCHC: 31.7 g/dL (ref 30.0–36.0)
MCV: 93.9 fL (ref 80.0–100.0)
Platelets: 160 10*3/uL (ref 150–400)
RBC: 3.8 MIL/uL — ABNORMAL LOW (ref 4.22–5.81)
RDW: 14.9 % (ref 11.5–15.5)
WBC: 10.8 10*3/uL — ABNORMAL HIGH (ref 4.0–10.5)
nRBC: 0 % (ref 0.0–0.2)

## 2023-05-12 LAB — BASIC METABOLIC PANEL WITH GFR
Anion gap: 8 (ref 5–15)
BUN: 18 mg/dL (ref 8–23)
CO2: 26 mmol/L (ref 22–32)
Calcium: 8.6 mg/dL — ABNORMAL LOW (ref 8.9–10.3)
Chloride: 100 mmol/L (ref 98–111)
Creatinine, Ser: 0.94 mg/dL (ref 0.61–1.24)
GFR, Estimated: >60 mL/min (ref >60)
Glucose, Bld: 192 mg/dL — ABNORMAL HIGH (ref 70–99)
Potassium: 3.4 mmol/L — ABNORMAL LOW (ref 3.5–5.1)
Sodium: 134 mmol/L — ABNORMAL LOW (ref 135–145)

## 2023-05-12 LAB — MRSA NEXT GEN BY PCR, NASAL: MRSA by PCR Next Gen: NOT DETECTED

## 2023-05-12 LAB — VITAMIN B12: Vitamin B-12: 531 pg/mL (ref 180–914)

## 2023-05-12 LAB — TSH: TSH: 0.501 u[IU]/mL (ref 0.350–4.500)

## 2023-05-12 MED ORDER — POTASSIUM CHLORIDE CRYS ER 20 MEQ PO TBCR
20.0000 meq | EXTENDED_RELEASE_TABLET | Freq: Once | ORAL | Status: AC
  Administered 2023-05-12: 20 meq via ORAL
  Filled 2023-05-12: qty 1

## 2023-05-12 MED ORDER — CHLORHEXIDINE GLUCONATE CLOTH 2 % EX PADS
6.0000 | MEDICATED_PAD | Freq: Every day | CUTANEOUS | Status: DC
  Administered 2023-05-12 – 2023-05-18 (×6): 6 via TOPICAL

## 2023-05-12 MED ORDER — SODIUM CHLORIDE 0.9 % IV SOLN
2.0000 g | INTRAVENOUS | Status: DC
  Administered 2023-05-12: 2 g via INTRAVENOUS
  Filled 2023-05-12: qty 20

## 2023-05-12 MED ORDER — ENSURE ENLIVE PO LIQD
237.0000 mL | Freq: Two times a day (BID) | ORAL | Status: DC
  Administered 2023-05-13 – 2023-05-18 (×11): 237 mL via ORAL

## 2023-05-12 MED ORDER — VANCOMYCIN HCL 1250 MG/250ML IV SOLN: 1250.0000 mg | INTRAVENOUS | Status: DC

## 2023-05-12 MED ORDER — LORAZEPAM 0.5 MG PO TABS
0.2500 mg | ORAL_TABLET | Freq: Every evening | ORAL | Status: DC | PRN
  Administered 2023-05-12: 0.25 mg via ORAL
  Filled 2023-05-12: qty 1

## 2023-05-12 NOTE — Progress Notes (Signed)
 Started CPT with patient with flutter valve. Had bed percussion worked would have liked to start with bed. Patient had several good coughs but did not produce any sputum.

## 2023-05-12 NOTE — Consult Note (Signed)
 ORTHOPAEDIC CONSULTATION  REQUESTING PHYSICIAN: Wynetta Heckle, MD  ASSESSMENT AND PLAN: 73 y.o. male with the following: Right lesser trochanter fracture, minimally displaced.  Periprosthetic fracture of the right proximal femur  Continue with protected weightbearing.  Use a walker at all times.  Weightbearing as tolerated.  He is scheduled to follow-up with Dr. Pryor Browning on May 6.  Patient can be evaluated at this time for his periprosthetic fractures.  Otherwise, I can see him in clinic in 1-2 weeks.  - Weight Bearing Status/Activity: Protected WB with a walker  - Additional recommended labs/tests: None  -VTE Prophylaxis: As needed  - Pain control: As needed  - Follow-up plan: 1-2 weeks  -Procedures: None  Chief Complaint: Right hip pain  HPI: [name] is a 73 y.o. male with PMH as listed below who presents after a mechanical fall.  He also sustained a right hip periprosthetic fracture that was treated without surgery.  He was recovering following that injury when he sustained another fall.  He was reportedly showing signs of confusion when he got up to use the restroom and fell.  He has pain in his right hip.  He also has signs and symptoms consistent with an infection. Diagnosed with a UTI, concerns for sepsis.   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 Ottelin   GASTROSTOMY TUBE PLACEMENT  04/04/2013   LAPAROSCOPIC GASTROSTOMY N/A 04/04/2013   Procedure: LAPAROSCOPIC GASTROSTOMY TUBE PLACEMENT ;  Surgeon: Shela Derby, MD;  Location: [facility] OR;  Service: General;  Laterality: N/A;   MULTIPLE EXTRACTIONS WITH ALVEOLOPLASTY N/A 04/14/2013   Procedure: Extraction of tooth #'s 1,2,3,4,5,6,7,8,9,10,11,12,13,14,15,17,18,19,20,21,22,23,24,25,26,27,28,29, 30, 31, and 32 with alveoloplasty and bilateral mandibular tori reductions.;  Surgeon: Carol Chroman, DDS;  Location: [facility] OR;  Service: Oral Surgery;  Laterality: N/A;   NASAL HEMORRHAGE CONTROL N/A 04/04/2013   Procedure: Control of oropharyngeal hemorrhage;  Surgeon: Lawence Press, MD;  Location: [facility] OR;  Service: ENT;  Laterality: N/A;   PORTACATH PLACEMENT Right 04/04/2013   PORTACATH PLACEMENT N/A 04/04/2013   Procedure: INSERTION PORT-A-CATH;  Surgeon: Shela Derby, MD;  Location: [facility] OR;  Service: General;  Laterality: N/A;   TOTAL HIP ARTHROPLASTY Right 2000   Dr. Abigail Abler   Social History   Socioeconomic History   Marital status: Married    Spouse name: Not on file   Number of children: Not on file   Years of education: Not on file   Highest education level: Not on file  Occupational History   Not on file  Tobacco Use   Smoking status: Never   Smokeless tobacco: Never  Substance and Sexual Activity   Alcohol  use: No    Comment: Occasional beer   Drug use: No   Sexual activity: Yes    Comment: married  Other Topics Concern   Not on file  Social History Narrative   Not on file   Social Drivers of Health   Financial Resource Strain: Not on file  Food Insecurity: No Food Insecurity (03/25/2023)   Hunger Vital Sign    Worried About Running Out of Food in the Last Year: Never true    Ran Out of Food in the Last Year: Never true  Transportation Needs: No Transportation Needs (03/25/2023)   PRAPARE -  Administrator, Civil Service (Medical): No    Lack of Transportation (Non-Medical): No  Physical Activity: Not on file  Stress: Not on file  Social Connections: Socially Isolated (03/25/2023)   Social Connection and Isolation Panel [NHANES]    Frequency of Communication with Friends and Family: More than three times a week    Frequency of Social Gatherings with Friends and Family: More than three times a week    Attends Religious Services: Never    Database administrator or Organizations: No    Attends Banker Meetings: Never    Marital Status: Widowed   Family History  Problem Relation Age of Onset   Cancer Cousin        living brain cancer, male   Cancer Mother        Deceased with leukemia, had uterine ca   Hyperlipidemia Brother    Allergies  Allergen Reactions   Neurontin [Gabapentin] Other (See Comments)    Causes seizures   Buspirone Other (See Comments)    Lips swelling    Dilaudid  [Hydromorphone  Hcl] Other (See Comments)    Oxygen saturation drops   Prior to Admission medications   Medication Sig Start Date End Date Taking? Authorizing Provider  acetaminophen  (TYLENOL ) 500 MG tablet Take 2 tablets (1,000 mg total) by mouth every 6 (six) hours as needed. Patient taking differently: Take 1,000 mg by mouth every 6 (six) hours as needed for mild pain (pain score 1-3). 03/30/23  Yes Marlin Simmonds, PA-C  alprazolam  (XANAX ) 2 MG tablet Take 2 mg by mouth in the morning, at noon, and at bedtime. 08/02/12  Yes [provider]  citalopram  (CELEXA ) 40 MG tablet Take 1 tablet by mouth daily. 02/18/17  Yes [provider]  rOPINIRole (REQUIP) 2 MG tablet Take 2 mg by mouth at bedtime.   Yes [provider]  rosuvastatin  (CRESTOR ) 10 MG tablet Take 10 mg by mouth at bedtime. 03/15/23  Yes [provider]  traMADol  (ULTRAM ) 50 MG tablet Take 1-2 tablets (50-100 mg total) by mouth every 6 (six) hours as needed for moderate pain  (pain score 4-6) or severe pain (pain score 7-10) (50mg  moderate, 100mg  severe pain). 03/30/23  Yes Marlin Simmonds, PA-C   DG CHEST PORT 1 VIEW Result Date: 05/12/2023 CLINICAL DATA:  [id number] with hypoxia and shortness of breath. EXAM: PORTABLE CHEST 1 VIEW COMPARISON:  AP Lat chest yesterday at 4:07 p.m. FINDINGS: 5:44 a.m. The cardiomediastinal silhouette is normal. Central vascular prominence continues to be seen. There is new patchy airspace disease in the left lower lung field most likely in the lower lobe as there is preservation of the left heart silhouette. Findings consistent with pneumonia aspiration. Remaining lungs are clear with mild elevation of the right hemidiaphragm. Right subclavian port catheter again terminates at the superior cavoatrial junction. No new osseous abnormality  is seen.  Thoracic spondylosis. IMPRESSION: 1. New patchy airspace disease in the left lower lung field most likely in the lower lobe, consistent with pneumonia or aspiration. 2. Central vascular prominence again seen.  Normal cardiac size. Electronically Signed   By: Denman Fischer M.D.   On: 05/12/2023 06:03   CT Head Wo Contrast Result Date: 05/11/2023 CLINICAL DATA:  Head trauma, minor (Age >= 65y); Neck trauma (Age >= 65y). EXAM: CT HEAD WITHOUT CONTRAST CT CERVICAL SPINE WITHOUT CONTRAST TECHNIQUE: Multidetector CT imaging of the head and cervical spine was performed following the standard protocol without intravenous contrast. Multiplanar CT image reconstructions of the cervical spine were also generated. RADIATION DOSE REDUCTION: This exam was performed according to the departmental dose-optimization program which includes automated exposure control, adjustment of the mA and/or kV according to patient size and/or use of iterative reconstruction technique. COMPARISON:  CT head, maxillofacial, and cervical spine 03/24/2023. FINDINGS: CT HEAD FINDINGS Brain: There is no evidence of an acute infarct, intracranial  hemorrhage, mass, midline shift, or extra-axial fluid collection. Mild cerebral atrophy is within normal limits for age. Hypodensities in the cerebral white matter are unchanged and nonspecific but compatible with mild-to-moderate chronic small vessel ischemic disease. Vascular: Calcified atherosclerosis at the skull base. No hyperdense vessel. Skull: Fractures of the right orbit, maxillary sinus, and zygomatic arch as described on last month's studies. No new fracture. Sinuses/Orbits: Resolved right orbital emphysema and proptosis. Improved aeration of the right maxillary sinus. Clear mastoid air cells. Other: None. CT CERVICAL SPINE FINDINGS Alignment: No acute traumatic malalignment. Skull base and vertebrae: No acute fracture or suspicious lesion. Soft tissues and spinal canal: No prevertebral fluid or swelling. No visible canal hematoma. Disc levels: C6-7 vertebral body ankylosis. Advanced disc degeneration at C5-6. Advanced facet arthrosis bilaterally at C3-4 and on the left at C4-5. Upper chest: Partially visualized biapical pleuroparenchymal lung scarring including a nodular component on the left, present since at least 2019. Other: None. IMPRESSION: 1. No evidence of acute intracranial abnormality or acute cervical spine fracture. 2. Right-sided zygomaticomaxillary complex fractures as described on last month's CTs. Electronically Signed   By: Aundra Lee M.D.   On: 05/11/2023 20:49   CT Cervical Spine Wo Contrast Result Date: 05/11/2023 CLINICAL DATA:  Head trauma, minor (Age >= 65y); Neck trauma (Age >= 65y). EXAM: CT HEAD WITHOUT CONTRAST CT CERVICAL SPINE WITHOUT CONTRAST TECHNIQUE: Multidetector CT imaging of the head and cervical spine was performed following the standard protocol without intravenous contrast. Multiplanar CT image reconstructions of the cervical spine were also generated. RADIATION DOSE REDUCTION: This exam was performed according to the departmental dose-optimization program  which includes automated exposure control, adjustment of the mA and/or kV according to patient size and/or use of iterative reconstruction technique. COMPARISON:  CT head, maxillofacial, and cervical spine 03/24/2023. FINDINGS: CT HEAD FINDINGS Brain: There is no evidence of an acute infarct, intracranial hemorrhage, mass, midline shift, or extra-axial fluid collection. Mild cerebral atrophy is within normal limits for age. Hypodensities in the cerebral white matter are unchanged and nonspecific but compatible with mild-to-moderate chronic small vessel ischemic disease. Vascular: Calcified atherosclerosis at the skull base. No hyperdense vessel. Skull: Fractures of the right orbit, maxillary sinus, and zygomatic arch as described on last month's studies. No new fracture. Sinuses/Orbits: Resolved right orbital emphysema and proptosis. Improved aeration of the right maxillary sinus. Clear mastoid air cells. Other: None. CT CERVICAL SPINE FINDINGS Alignment: No acute traumatic malalignment. Skull base and vertebrae: No acute fracture or  suspicious lesion. Soft tissues and spinal canal: No prevertebral fluid or swelling. No visible canal hematoma. Disc levels: C6-7 vertebral body ankylosis. Advanced disc degeneration at C5-6. Advanced facet arthrosis bilaterally at C3-4 and on the left at C4-5. Upper chest: Partially visualized biapical pleuroparenchymal lung scarring including a nodular component on the left, present since at least 2019. Other: None. IMPRESSION: 1. No evidence of acute intracranial abnormality or acute cervical spine fracture. 2. Right-sided zygomaticomaxillary complex fractures as described on last month's CTs. Electronically Signed   By: Aundra Lee M.D.   On: 05/11/2023 20:49   DG Pelvis 1-2 Views Result Date: 05/11/2023 CLINICAL DATA:  Marvell Slider, right hip pain, recent periprosthetic right hip fracture EXAM: PELVIS - 1-2 VIEW COMPARISON:  03/24/2023, 03/25/2023 FINDINGS: Frontal view of the pelvis  includes both hips. Right hip arthroplasty is identified, with distal margin of the femoral component excluded by collimation. Periosteal reaction is seen along the site of prior periprosthetic fracture in the lateral proximal right femur. Since the previous exam, a nondisplaced fracture is identified at the lesser trochanter, with no callus formation, which may be related to acute trauma. No other acute bony abnormalities. Stable left hip osteoarthritis. Multiple bladder calculi are again noted. IMPRESSION: 1. Interval nondisplaced fracture through the lesser trochanter of the proximal right femur, with no callus formation, likely related to acute trauma. 2. Healing prior periprosthetic fracture through the lateral aspect of the proximal right femur, with moderate callus formation since prior study. Electronically Signed   By: Bobbye Burrow M.D.   On: 05/11/2023 17:01   DG Chest 2 View Result Date: 05/11/2023 CLINICAL DATA:  Marvell Slider, right hip pain, hypoxia EXAM: CHEST - 2 VIEW COMPARISON:  03/24/2023, 03/25/2023 FINDINGS: Frontal and lateral views of the chest demonstrates stable right chest wall port. The cardiac silhouette is unremarkable. Chronic pulmonary vascular congestion without acute airspace disease, effusion, or pneumothorax. No acute bony abnormalities. IMPRESSION: 1. No acute intrathoracic process. Electronically Signed   By: Bobbye Burrow M.D.   On: 05/11/2023 16:59   Family History Reviewed and non-contributory, no pertinent history of problems with bleeding or anesthesia    Review of Systems + fevers or chills No numbness or tingling No chest pain No shortness of breath No bowel or bladder dysfunction No GI distress No headaches + confusion    OBJECTIVE  Vitals:Patient Vitals for the past 8 hrs:  BP Temp Temp src Pulse Resp SpO2  05/12/23 0820 (!) 152/81 -- -- (!) 115 18 94 %  05/12/23 0810 (!) 163/84 -- -- (!) 116 18 92 %  05/12/23 0800 (!) 162/80 -- -- (!) 117 13 92 %   05/12/23 0750 (!) 175/90 -- -- (!) 119 14 93 %  05/12/23 0740 (!) 153/77 -- -- (!) 117 (!) 26 91 %  05/12/23 0732 (!) 156/78 (!) 97.4 F (36.3 C) Oral (!) 117 20 97 %  05/12/23 0730 (!) 163/101 -- -- (!) 116 18 92 %  05/12/23 0700 137/70 -- -- 86 18 98 %  05/12/23 0650 (!) 146/62 -- -- 86 12 96 %  05/12/23 0640 (!) 127/59 -- -- 75 12 97 %  05/12/23 0630 (!) 103/59 -- -- 76 17 94 %  05/12/23 0615 (!) 100/47 -- -- 73 12 92 %  05/12/23 0600 (!) 94/49 -- -- 68 11 96 %  05/12/23 0545 131/82 -- -- 80 17 95 %  05/12/23 0530 (!) 98/50 -- -- 65 (!) 9 92 %  05/12/23 0523 (!) 99/54 -- --  64 13 --  05/12/23 0506 -- -- -- 73 12 (!) 85 %  05/12/23 0500 (!) 113/48 -- -- 82 13 --  05/12/23 0456 -- -- -- 92 11 90 %  05/12/23 0454 -- 98.3 F (36.8 C) Axillary -- -- --  05/12/23 0445 (!) 143/84 -- -- 97 14 --  05/12/23 0430 131/70 -- -- 80 (!) 9 99 %  05/12/23 0415 120/67 -- -- 77 11 90 %  05/12/23 0400 122/68 -- -- 67 10 96 %  05/12/23 0345 114/72 -- -- 68 12 95 %  05/12/23 0330 111/63 -- -- 67 (!) 9 93 %  05/12/23 0315 106/62 -- -- 69 (!) 9 92 %  05/12/23 0300 111/65 -- -- 70 (!) 9 95 %  05/12/23 0245 110/65 -- -- 73 -- 95 %  05/12/23 0230 123/66 -- -- 81 10 94 %  05/12/23 0215 113/64 -- -- 81 17 96 %  05/12/23 0200 126/65 -- -- 84 16 93 %  05/12/23 0145 132/65 -- -- 87 12 92 %  05/12/23 0130 (!) 102/58 -- -- 73 10 91 %  05/12/23 0115 (!) 93/59 -- -- 72 10 90 %  05/12/23 0100 (!) 99/59 -- -- 73 10 90 %  05/12/23 0046 -- -- -- 74 10 90 %  05/12/23 0045 95/62 -- -- 70 -- 90 %  05/12/23 0043 -- -- -- 74 10 91 %  05/12/23 0042 (!) 99/59 -- -- 74 -- 92 %   General: Alert, no acute distress Cardiovascular: Warm extremities noted Respiratory: No cyanosis, no use of accessory musculature GI: No organomegaly, abdomen is soft and non-tender Skin: No lesions in the area of chief complaint other than those listed below in MSK exam.  Neurologic: Sensation intact distally save for the below mentioned  MSK exam Psychiatric: Patient is competent for consent with normal mood and affect Lymphatic: No swelling obvious and reported other than the area involved in the exam below Extremities   Right hip without deformity.  No swelling.  No redness.  Well healed surgical incision.  Pain with motion.  Sensation intact distally.  Active motion intact to the EHL/TA    Test Results Imaging  AR of the right hip demonstrates a well positioned THA.  Lateral periprosthetic fracture in stable alignment with evidence of healing.  Minimally displaced fracture of the lesser trochanter.   Labs cbc Recent Labs    05/11/23 1500  WBC 5.3  HGB 11.4*  HCT 36.1*  PLT 156     Labs coag Recent Labs    05/11/23 1850  INR 1.2    Recent Labs    05/11/23 1500  NA 135  K 3.6  CL 98  CO2 28  GLUCOSE 102*  BUN 32*  CREATININE 1.43*  CALCIUM  9.3

## 2023-05-12 NOTE — ED Notes (Addendum)
 Pt repositioned and warm blanket provided. Levophed  now at 3 mcg/min. HR slightly elevated. Provider now at bedside speaking with daughter.

## 2023-05-12 NOTE — Progress Notes (Signed)
 Transition of Care (TOC) -30 day Note        Transition of Care [facility]) CM/SW Contact  Name: [name]  Phone Number: [phone]  MUST ID: [id number]   To Whom it May Concern:   Please be advised that the above patient will require a short-term nursing home stay, anticipated 30 days or less rehabilitation and strengthening. The plan is for return home.

## 2023-05-12 NOTE — Progress Notes (Signed)
 [facility] PROGRESS NOTE  [name] (DOB: 1950-02-05) ZOX:[id number] PCP: Minus Amel, MD  Brief Narrative: [name] is a 73 y.o. 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 He returned to the ED 4/28 after another fall, found to have a new right lesser trochanter femur fracture. He developed lethargy due to evolving septic shock due to UTI in the ED for which antibiotics were broadened to vancomycin  and zosyn  and IV fluid resuscitation were administered. He has required peripheral levophed . VBG reassuring, patient is known DNR/DNI status and NIPPV may be contraindicated with facial fractures.   Subjective: Pt somewhat confused but more interactive than previously. Daughter at bedside supplements history. He denies pain. BP actually elevated, we're decreasing levophed .   Objective: BP (!) 103/57   Pulse 91   Temp 100 F (37.8 C) (Axillary)   Resp 15   Ht 6\' 2"  (1.88 m)   Wt 76.2 kg   SpO2 97%   BMI 21.57 kg/m   Gen: No acute distress Neck: Hardening of right side of neck consistent with hx radiation Pulm: Nonlabored, normal rate, no wheezes or crackles  CV: Regular tachycardia this AM, no MRG, no pitting edema or JVD. GI: Soft, NT, ND, +BS  Neuro: Alert and incompletely oriented but cooperative and responsive, exam limited by painful ROM but no new focal deficits definitively noted. Ext: LE's with normal muscle bulk and tone, palpable DP pulses, SILT.   Skin: No acute wounds on visualized skin   Assessment &  Plan: Septic shock due to complicated UTI:   - Continue peripheral levo, wean as tolerated.  - Continue broad antibiotics. Given negative MRSA PCR and suspicion for urinary pathogen, DC vancomycin . Given history of E. coli and Klebsiella in urine last month (resistant to amp, intermediate to amp/sulbactam, and resistant to gent), will change zosyn  back to ceftriaxone . Urine culture here already growing GNRs. Blood cultures NGTD thus far.  - PCCM consulted at admission   Recurrent falls:  - PT/OT to consult 4/30 and/or when cleared by orthopedics. CSW already following as SNF rehabilitation is anticipated to be required for him.   New right lesser trochanter femur fracture: Recent trauma admission with orbital fracture, right calvarium fracture, right periprosthetic hip fracture. This admission underwent imaging including CT head / C spine and chest x-ray with no acute injuries, pelvic x-ray demonstrating the new right lesser trochanteric hip fracture and a healing right periprosthetic hip fracture. - Orthopedics consulted, appreciate recommendations.    History of facial/orbital fractures: CT head/cervical spine this admission shows no acute intracranial or cervical spine findings and stable right-sided zygomaticomaxillary complex fractures as described on last month's CTs. - Follow up with ENT.   Acute toxic/metabolic encephalopathy on chronic cognitive impairment: Due to septic shock, mild hypercarbia without respiratory acidosis. Benzodiazepine contributed, though this is improving now. Ammonia, TSH, vitamin B12 noted to be normal. CT head nonacute - Hypercarbia beginning to improve and his mental status has improved considerably.   Acute hypercarbic respiratory failure: Due to AMS-related hypoventilation which has improved. No indication for further Tx at this time. Pt would not be candidate for NIPPV with  facial fractures nor intubation based on goals of care.  - Can supplement O2 to  maintain normal WOB, though would avoid over-supplementation.   AKI: SCr 1.4 up from baseline ~1.0.  - Recheck now and avoid nephrotoxins, DC vancomycin  as above.  - Check renal U/S given urologic history.    Metastatic tonsillar SCC: mets to lung, s/p chemoradiation previously NED in '19; lost to f/u with oncology.  - Reestablish care with oncology, Dr. Cheree Cords after discharge.    Urethral stricture:  - Continue intermittent catheterization q6h.   Diet-controlled NIDT2DM: Last HbA1c 5.3%.  - Monitor glucose with labs, will not plan to restrict diet at this time   HLD:  - Continue rosuvastatin   Neuropathy: Noted, may also contribute to falls.   Anxiety/mood disorder:  - Continue SSRI, aim to wean benzodiazepine.    Wynetta Heckle, MD [facility] [email or web address] 05/12/2023, 12:59 PM

## 2023-05-12 NOTE — Plan of Care (Signed)

## 2023-05-12 NOTE — [place] FL2 (Signed)
 [place]  MEDICAID FL2 LEVEL OF CARE FORM     IDENTIFICATION  Patient Name: [name] Birthdate: 1950/07/02 Sex: male Admission Date (Current Location): 05/11/2023  [facility] and [place] Number:  [employer] and Address:  [facility],  [address], [name] [address]      Provider Number: [id number]  Attending Physician Name and Address:  No att. providers found  Relative Name and Phone Number:       Current Level of Care: Hospital Recommended Level of Care: Skilled Nursing Facility Prior Approval Number:    Date Approved/Denied:   PASRR Number:    Discharge Plan: SNF    Current Diagnoses: Patient Active Problem List   Diagnosis Date Noted   Ground-level fall 05/11/2023   Closed fracture of lesser trochanter of right femur (HCC) 05/11/2023   Septic shock (HCC) 05/11/2023   Acute encephalopathy 05/11/2023   AKI (acute kidney injury) (HCC) 05/11/2023   Right lower lobe pulmonary infiltrate 03/26/2023   Acute respiratory failure with hypoxia (HCC) 03/26/2023   Pressure injury of skin 03/26/2023   Urinary tract infection 03/26/2023   Aspiration pneumonia of right lower lobe (HCC) 03/25/2023   Head injury 03/24/2023   Critical polytrauma 03/24/2023   Delirium    Anuria 01/18/2018   Acute retention of urine 01/17/2018   Acute metabolic encephalopathy 01/15/2018   Dehydration 01/15/2018   Chronic pain syndrome 01/15/2018   Scalp laceration    Metastasis to lung (HCC) 10/19/2013   Mucositis due to antineoplastic therapy 06/10/2013   Protein-calorie malnutrition, severe (HCC) 06/10/2013   Rash 06/10/2013   Gastrostomy tube dyslodgement - replaced 05/06/2013 05/06/2013   Gastrostomy in place [facility]) 04/04/2013   Tonsil cancer (HCC) 03/22/2013   Diabetic neuropathy, painful (HCC) 03/22/2013   Diabetes mellitus, type 2 (HCC) 10/18/2012   Other and unspecified hyperlipidemia 10/18/2012   Obesity, unspecified 10/18/2012    Orientation  RESPIRATION BLADDER Height & Weight     Self, Time, Situation, Place  O2 (see dc summary) Continent Weight: 167 lb 15.9 oz (76.2 kg) Height:  6\' 2"  (188 cm)  BEHAVIORAL SYMPTOMS/MOOD NEUROLOGICAL BOWEL NUTRITION STATUS      Continent Diet (see dc summary)  AMBULATORY STATUS COMMUNICATION OF NEEDS Skin   Extensive Assist Verbally PU Stage and Appropriate Care (L buttocks)   PU Stage 2 Dressing: Daily                   Personal Care Assistance Level of Assistance  Bathing, Feeding, Dressing Bathing Assistance: Limited assistance Feeding assistance: Independent Dressing Assistance: Limited assistance     Functional Limitations Info  Sight, Hearing, Speech Sight Info: Adequate Hearing Info: Adequate Speech Info: Impaired    SPECIAL CARE FACTORS FREQUENCY  PT (By licensed PT), OT (By licensed OT)     PT Frequency: 5x week OT Frequency: 5x week            Contractures Contractures Info: Not present    Additional Factors Info  Code Status, Allergies Code Status Info: DNR Allergies Info: Neurontin, Buspirone, Dilaudid            Current Medications (05/12/2023):  This is the current hospital active medication list Current Facility-Administered Medications  Medication Dose Route Frequency Provider Last Rate Last Admin   0.9 %  sodium chloride  infusion  250 mL Intravenous Continuous Segars, Jonathan, MD   Stopped at 05/12/23 0913   acetaminophen  (TYLENOL ) tablet 1,000 mg  1,000 mg Oral Q6H PRN Arnulfo Larch, MD  1,000 mg at 05/12/23 0908   albuterol  (PROVENTIL ) (2.5 MG/3ML) 0.083% nebulizer solution 2.5 mg  2.5 mg Nebulization Q4H PRN Segars, Arlyce Lambert, MD       Chlorhexidine  Gluconate Cloth 2 % PADS 6 each  6 each Topical Q0600 Wynetta Heckle, MD   6 each at 05/12/23 4098   LORazepam  (ATIVAN ) tablet 0.25 mg  0.25 mg Oral QHS PRN Segars, Jonathan, MD       melatonin tablet 6 mg  6 mg Oral QHS PRN Segars, Jonathan, MD       norepinephrine  (LEVOPHED ) 4mg  in  (0.016 mg/mL) premix infusion  2-10 mcg/min Intravenous Titrated Segars, Jonathan, MD 7.5 mL/hr at 05/12/23 1030 2 mcg/min at 05/12/23 1030   piperacillin -tazobactam (ZOSYN ) IVPB 3.375 g  3.375 g Intravenous Q8H Segars, Jonathan, MD   Stopped at 05/12/23 0744   polyethylene glycol (MIRALAX  / GLYCOLAX ) packet 17 g  17 g Oral Daily PRN Segars, Jonathan, MD       rosuvastatin  (CRESTOR ) tablet 10 mg  10 mg Oral QHS Segars, Arlyce Lambert, MD       sodium chloride  flush (NS) 0.9 % injection 3 mL  3 mL Intravenous Q12H Segars, Jonathan, MD   3 mL at 05/12/23 1191   vancomycin  (VANCOREADY) IVPB 1500 mg/300 mL  1,500 mg Intravenous Q24H Segars, Jonathan, MD         Discharge Medications: Please see discharge summary for a list of discharge medications.  Relevant Imaging Results:  Relevant Lab Results:   Additional Information SSN: [id number] LCSW

## 2023-05-12 NOTE — Evaluation (Signed)
 Clinical/Bedside Swallow Evaluation Patient Details  Name: [name] MRN: [id number] Date of Birth: [birth year]  Today's Date: 05/12/2023 Time: SLP Start Time (ACUTE ONLY): 1320 SLP Stop Time (ACUTE ONLY): 1343 SLP Time Calculation (min) (ACUTE ONLY): 23 min  Past Medical History:  Past Medical History:  Diagnosis Date   Allergy    Anxiety    Arthritis    knees, HIps, Hands   Chest pain    10/14   Complication of anesthesia    bleeding during intubation 04/04/13 due to friability of right tonsillar cancer   Constipation    Depression    Fibromyalgia    Lung cancer (HCC)    Neuropathy    compression neuropathy right hip;s/p replacement   PEG (percutaneous endoscopic gastrostomy) status (HCC)    Pneumonia 01/13/2010   S/P radiation therapy 05/02/2013-06/22/2013   70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 Ottelin   GASTROSTOMY TUBE PLACEMENT  04/04/2013   LAPAROSCOPIC GASTROSTOMY N/A 04/04/2013   Procedure: LAPAROSCOPIC GASTROSTOMY TUBE PLACEMENT ;  Surgeon: Shela Derby, MD;  Location: [facility] OR;  Service: General;  Laterality: N/A;   MULTIPLE EXTRACTIONS WITH ALVEOLOPLASTY N/A 04/14/2013   Procedure: Extraction of tooth #'s 1,2,3,4,5,6,7,8,9,10,11,12,13,14,15,17,18,19,20,21,22,23,24,25,26,27,28,29, 30, 31, and 32 with alveoloplasty and bilateral mandibular tori reductions.;  Surgeon: Carol Chroman, DDS;  Location: [facility] OR;  Service: Oral Surgery;  Laterality: N/A;   NASAL HEMORRHAGE CONTROL N/A 04/04/2013   Procedure: Control of oropharyngeal hemorrhage;  Surgeon: Lawence Press, MD;  Location[facility];  Service: ENT;  Laterality: N/A;   PORTACATH PLACEMENT Right 04/04/2013   PORTACATH PLACEMENT N/A 04/04/2013   Procedure: INSERTION PORT-A-CATH;  Surgeon: Shela Derby, MD;  Location: [facility] OR;  Service: General;  Laterality: N/A;   TOTAL HIP ARTHROPLASTY Right 2000   Dr. Abigail Abler   HPI:  [name] is a 73 y.o. 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 He returned to the ED 4/28 after another fall, found to have a new right lesser trochanter femur fracture. He developed lethargy due to evolving septic shock due to UTI in the ED for which antibiotics were broadened to vancomycin  and zosyn  and IV fluid resuscitation were administered. He has required peripheral levophed . VBG reassuring, patient is known DNR/DNI status and NIPPV may be contraindicated with facial fractures. BSE requested. Pt last had MBS 04/26/2015 with recommendation for D3/NTL and was seen for outpatient dysphagia therapy.    Assessment / Plan / Recommendation  Clinical Impression  Pt was previouslly seen for outpatient dysphagia therapy in 2017, however he stopped coming to therapy. He was consuming D3 and NTL at that time, but suspect non-compliance. Daughter reports that he drinks thin liquids at home and has had several bouts of PNA in the past. His wife passed away about a year ago. Oral motor exam in WNL, Pt with evidence of fibrotic  changes in his neck from previous radiation therapy. He was assessed with ice chips, thin water, NTL juice, puree, and graham crackers. Pt without overt coughing and mild occasional wet vocal quality over trials. His daughter reports that he seemed to regurgitate his lunch meal (difficult to know if  this is related to pharyngeal residue/stasis or esophageal motility). Given deconditioned status, previous known history of silent aspiration of thins, and today's regurgitation, recommend MBSS to help Pt/family make informed decisions about his swallowing and care. Continue thin liquids and change textures to D3/mech soft and SLP will check back tomorrow. SLP Visit Diagnosis: Dysphagia, unspecified (R13.10)    Aspiration Risk  Mild aspiration risk    Diet Recommendation Dysphagia 3 (Mech soft);Thin liquid    Liquid Administration via: Cup;Straw Medication Administration: Whole meds with liquid Supervision: Patient able to self feed Compensations: Multiple dry swallows after each bite/sip Postural Changes: Seated upright at 90 degrees;Remain upright for at least 30 minutes after po intake    Other  Recommendations Oral Care Recommendations: Oral care BID;Staff/trained caregiver to provide oral care    Recommendations for follow up therapy are one component of a multi-disciplinary discharge planning process, led by the attending physician.  Recommendations may be updated based on patient status, additional functional criteria and insurance authorization.  Follow up Recommendations Follow physician's recommendations for discharge plan and follow up therapies      Assistance Recommended at Discharge    Functional Status Assessment Patient has had a recent decline in their functional status and demonstrates the ability to make significant improvements in function in a reasonable and predictable amount of time.  Frequency and Duration min 2x/week  1 week       Prognosis Prognosis for improved oropharyngeal function: Fair Barriers to Reach Goals: Severity of deficits;Time post onset      Swallow Study   General Date of Onset: 05/12/23 HPI: [name] is a 73 y.o. 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 He returned to the ED 4/28 after another fall, found to have a new right lesser trochanter femur fracture. He developed lethargy due to evolving septic shock due to UTI in the ED for which antibiotics were broadened to vancomycin  and zosyn  and IV fluid resuscitation were administered. He has required peripheral levophed . VBG reassuring, patient is known DNR/DNI status and NIPPV may be contraindicated with facial fractures. BSE requested. Pt last had MBS 04/26/2015 with recommendation for D3/NTL and was seen for outpatient dysphagia therapy. Type of Study: Bedside Swallow Evaluation Previous Swallow Assessment: 04/26/2015 MBSS D3/NTL Diet Prior to this Study: Regular;Thin liquids (Level 0) Temperature Spikes Noted: No Respiratory Status: Room air History of Recent Intubation: No Behavior/Cognition: Alert;Cooperative;Pleasant mood Oral Cavity Assessment: Within Functional Limits Oral Care Completed by SLP: Yes Oral Cavity - Dentition: Edentulous Vision: Functional for self-feeding Self-Feeding Abilities: Able to feed self Patient Positioning: Upright in bed Baseline Vocal Quality: Normal Volitional Cough: Strong Volitional Swallow: Able to elicit    Oral/Motor/Sensory Function Overall Oral Motor/Sensory Function: Within functional limits   Ice Chips Ice chips: Within functional limits Presentation: Spoon   Thin Liquid Thin Liquid: Within  functional limits Presentation: Cup;Self Fed;Straw    Nectar Thick Nectar Thick Liquid: Within functional limits Presentation: Cup;Self Fed   Honey Thick Honey Thick Liquid:  Not tested   Puree Puree: Within functional limits Presentation: Spoon   Solid     Solid: Within functional limits Presentation: Self Fed     Thank you,  Claudetta Cuba, CCC-SLP [phone]  [name] 05/12/2023,5:22 PM

## 2023-05-12 NOTE — TOC Initial Note (Addendum)
 Transition of Care [facility]) - Initial/Assessment Note    Patient Details  Name: [name] MRN: [id number] Date of Birth: 1950-02-07  [facility] CM/SW Contact:    Linnea Richards, LCSW Phone Number: 05/12/2023, 12:07 PM  Clinical Narrative:                  Pt admitted from home. Received TOC consult for SNF placement.  Spoke with pt and dtr at bedside to assess and review dc planning. Daughter states pt lives alone and she lives just across the road. Family assists as needed. Per dtr, pt went home with [facility] after recent dc and they believe he will need SNF rehab after this stay. CMS provider options reviewed. Will refer as requested once PT/OT evals complete.  TOC will start insurance auth when dc timeframe determined.   Chart and MD indicate that pt has a legal guardian. [name] states that she thinks there was a miscommunication when pt was last here and her daughter was the family member present with pt. Pt is able to make his own decisions. Updated MD.  Will follow.   Expected Discharge Plan: Skilled Nursing Facility Barriers to Discharge: Continued Medical Work up   Patient Goals and CMS Choice Patient states their goals for this hospitalization and ongoing recovery are:: short term rehab CMS Medicare.gov Compare Post Acute Care list provided to:: Patient Represenative (must comment) Choice offered to / list presented to : Adult Children [place] ownership interest in [facility].provided to:: Adult Children    Expected Discharge Plan and Services In-house Referral: Clinical Social Work   Post Acute Care Choice: Skilled Nursing Facility Living arrangements for the past 2 months: Single Family Home                                      Prior Living Arrangements/Services Living arrangements for the past 2 months: Single Family Home Lives with:: Self Patient language and need for interpreter reviewed:: Yes Do you feel safe going back  to the place where you live?: Yes      Need for Family Participation in Patient Care: Yes (Comment) Care giver support system in place?: Yes (comment) Current home services: DME, Home PT, Home RN Criminal Activity/Legal Involvement Pertinent to Current Situation/Hospitalization: No - Comment as needed  Activities of Daily Living      Permission Sought/Granted Permission sought to share information with : Facility Industrial/product designer granted to share information with : Yes, Verbal Permission Granted     Permission granted to share info w AGENCY: snfs        Emotional Assessment Appearance:: Appears stated age Attitude/Demeanor/Rapport: Engaged Affect (typically observed): Pleasant Orientation: : Oriented to Self, Oriented to Place, Oriented to  Time, Oriented to Situation Alcohol / Substance Use: Not Applicable Psych Involvement: No (comment)  Admission diagnosis:  Recurrent falls [R29.6] Ground-level fall [W18.30XA] Patient Active Problem List   Diagnosis Date Noted   Ground-level fall 05/11/2023   Closed fracture of lesser trochanter of right femur (HCC) 05/11/2023   Septic shock (HCC) 05/11/2023   Acute encephalopathy 05/11/2023   AKI (acute kidney injury) (HCC) 05/11/2023   Right lower lobe pulmonary infiltrate 03/26/2023   Acute respiratory failure with hypoxia (HCC) 03/26/2023   Pressure injury of skin 03/26/2023   Urinary tract infection 03/26/2023   Aspiration pneumonia of right lower lobe (HCC) 03/25/2023   Head  injury 03/24/2023   Critical polytrauma 03/24/2023   Delirium    Anuria 01/18/2018   Acute retention of urine 01/17/2018   Acute metabolic encephalopathy 01/15/2018   Dehydration 01/15/2018   Chronic pain syndrome 01/15/2018   Scalp laceration    Metastasis to lung (HCC) 10/19/2013   Mucositis due to antineoplastic therapy 06/10/2013   Protein-calorie malnutrition, severe (HCC) 06/10/2013   Rash 06/10/2013   Gastrostomy tube  dyslodgement - replaced 05/06/2013 05/06/2013   Gastrostomy in place [facility]) 04/04/2013   Tonsil cancer (HCC) 03/22/2013   Diabetic neuropathy, painful (HCC) 03/22/2013   Diabetes mellitus, type 2 (HCC) 10/18/2012   Other and unspecified hyperlipidemia 10/18/2012   Obesity, unspecified 10/18/2012   PCP:  Minus Amel, MD Pharmacy:   [facility] - [place], [place] - [address] [place] [address] Phone: [phone] Fax: [phone]     Social Drivers of Health (SDOH) Social History: SDOH Screenings   Food Insecurity: No Food Insecurity (03/25/2023)  Housing: Low Risk  (03/25/2023)  Transportation Needs: No Transportation Needs (03/25/2023)  Utilities: Not At Risk (03/25/2023)  Social Connections: Socially Isolated (03/25/2023)  Tobacco Use: Low Risk  (05/11/2023)   SDOH Interventions:     Readmission Risk Interventions     No data to display

## 2023-05-13 ENCOUNTER — Inpatient Hospital Stay (HOSPITAL_COMMUNITY)

## 2023-05-13 DIAGNOSIS — W1830XA Fall on same level, unspecified, initial encounter: Secondary | ICD-10-CM | POA: Diagnosis not present

## 2023-05-13 DIAGNOSIS — J9601 Acute respiratory failure with hypoxia: Secondary | ICD-10-CM

## 2023-05-13 LAB — BASIC METABOLIC PANEL WITH GFR
Anion gap: 7 (ref 5–15)
BUN: 20 mg/dL (ref 8–23)
CO2: 28 mmol/L (ref 22–32)
Calcium: 9.1 mg/dL (ref 8.9–10.3)
Chloride: 104 mmol/L (ref 98–111)
Creatinine, Ser: 0.8 mg/dL (ref 0.61–1.24)
GFR, Estimated: >60 mL/min (ref >60)
Glucose, Bld: 109 mg/dL — ABNORMAL HIGH (ref 70–99)
Potassium: 3.6 mmol/L (ref 3.5–5.1)
Sodium: 139 mmol/L (ref 135–145)

## 2023-05-13 LAB — CBC
HCT: 36.1 % — ABNORMAL LOW (ref 39.0–52.0)
Hemoglobin: 11.2 g/dL — ABNORMAL LOW (ref 13.0–17.0)
MCH: 29.1 pg (ref 26.0–34.0)
MCHC: 31 g/dL (ref 30.0–36.0)
MCV: 93.8 fL (ref 80.0–100.0)
Platelets: 145 10*3/uL — ABNORMAL LOW (ref 150–400)
RBC: 3.85 MIL/uL — ABNORMAL LOW (ref 4.22–5.81)
RDW: 14.9 % (ref 11.5–15.5)
WBC: 11.3 10*3/uL — ABNORMAL HIGH (ref 4.0–10.5)
nRBC: 0 % (ref 0.0–0.2)

## 2023-05-13 LAB — URINE CULTURE: Culture: >=100000 — AB

## 2023-05-13 MED ORDER — ALPRAZOLAM 0.25 MG PO TABS
1.0000 mg | ORAL_TABLET | Freq: Every day | ORAL | Status: DC
  Administered 2023-05-13 – 2023-05-17 (×5): 1 mg via ORAL
  Filled 2023-05-13 (×5): qty 2

## 2023-05-13 MED ORDER — CITALOPRAM HYDROBROMIDE 20 MG PO TABS
40.0000 mg | ORAL_TABLET | Freq: Every day | ORAL | Status: DC
  Administered 2023-05-13 – 2023-05-18 (×6): 40 mg via ORAL
  Filled 2023-05-13 (×7): qty 2

## 2023-05-13 MED ORDER — ROPINIROLE HCL 1 MG PO TABS
1.0000 mg | ORAL_TABLET | Freq: Every day | ORAL | Status: DC
  Administered 2023-05-13 – 2023-05-17 (×5): 1 mg via ORAL
  Filled 2023-05-13 (×5): qty 1

## 2023-05-13 MED ORDER — SODIUM CHLORIDE 0.9 % IV SOLN
1.0000 g | Freq: Three times a day (TID) | INTRAVENOUS | Status: DC
  Administered 2023-05-13 – 2023-05-18 (×16): 1 g via INTRAVENOUS
  Filled 2023-05-13 (×16): qty 20

## 2023-05-13 MED ORDER — ALPRAZOLAM 0.5 MG PO TABS
0.5000 mg | ORAL_TABLET | Freq: Three times a day (TID) | ORAL | Status: DC
  Administered 2023-05-13: 0.5 mg via ORAL
  Filled 2023-05-13: qty 1

## 2023-05-13 MED ORDER — ALPRAZOLAM 0.25 MG PO TABS
0.5000 mg | ORAL_TABLET | Freq: Three times a day (TID) | ORAL | Status: DC
  Administered 2023-05-13 – 2023-05-18 (×15): 0.5 mg via ORAL
  Filled 2023-05-13: qty 1
  Filled 2023-05-13: qty 2
  Filled 2023-05-13 (×4): qty 1
  Filled 2023-05-13: qty 2
  Filled 2023-05-13 (×8): qty 1

## 2023-05-13 NOTE — Evaluation (Signed)
 Physical Therapy Evaluation Patient Details Name: [name] MRN: [id number] DOB: 04-14-1950 Today's Date: 05/13/2023  History of Present Illness  [name] is a 73 y.o. 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 He returns after another ground level fall today. His speech is hard to understand, but tells me that he hit his head.  Denies any headache or facial pain.  Denies syncopal episode, chest pain, palpitations.  Denies any other sites of pain although complained of pain in the right hip on ED evaluation.  He is not sure why he keeps falling but complains of jerking movements and trouble with coordination.  Denies any recent fever, chills, cough/cold, abdominal pain, nausea, vomiting, diarrhea, dysuria or other urinary changes.   Clinical Impression  Patient demonstrates fair/good return for sitting up at bedside, sit to stands and transferring to chair with slightly labored movement, unsteady on feet during ambulation with most difficulty making turns and limited mostly due to fatigue and SOB with SpO2 dropping from 90% to 80% while on 2 LPM O2.  Patient tolerated sitting up in chair after therapy and required O2 raised to 3 LPM to keep SpO2 above 90% - RN notified. Patient will benefit from continued skilled physical therapy in hospital and recommended venue below to increase strength, balance, endurance for safe ADLs and gait.          If plan is discharge home, recommend the following: A little help with  bathing/dressing/bathroom;Help with stairs or ramp for entrance;Assistance with cooking/housework;A little help with walking and/or transfers   Can travel by private vehicle   Yes    Equipment Recommendations None recommended by PT  Recommendations for Other Services       Functional Status Assessment Patient has had a recent decline in their functional status and demonstrates the ability to make significant improvements in function in a reasonable and predictable amount of time.     Precautions / Restrictions Precautions Precautions: Fall Recall of Precautions/Restrictions: Intact Restrictions Weight Bearing Restrictions Per Provider Order: Yes RLE Weight Bearing Per Provider Order: Weight bearing as tolerated      Mobility  Bed Mobility Overal bed mobility: Needs Assistance Bed Mobility: Supine to Sit     Supine to sit: Supervision, Contact guard     General bed mobility comments: slightly increased time, labored movement    Transfers Overall transfer level: Needs assistance Equipment used: Rolling walker (2 wheels) Transfers: Sit to/from Stand, Bed to chair/wheelchair/BSC Sit to Stand: Min assist   Step pivot transfers: Contact guard assist, Min assist       General transfer comment: increased time, labored movement    Ambulation/Gait Ambulation/Gait assistance: Min assist Gait Distance (Feet): 20 Feet Assistive device: Rolling walker (2 wheels) Gait Pattern/deviations: Decreased step length - right, Decreased step length - left, Decreased stride length Gait velocity: slow     General Gait Details: slow labored movement with difficulty making turns, limited mostly due to fatigue and SOB with SpO2 dropping from 90% to 80% while on 2 LPM O2  Stairs            Wheelchair  Mobility     Tilt Bed    Modified Rankin (Stroke Patients Only)       Balance Overall balance assessment: Needs assistance Sitting-balance support: Feet supported, No upper  extremity supported Sitting balance-Leahy Scale: Good Sitting balance - Comments: seated at EOB   Standing balance support: During functional activity, Reliant on assistive device for balance, Bilateral upper extremity supported Standing balance-Leahy Scale: Fair Standing balance comment: using RW                             Pertinent Vitals/Pain Pain Assessment Pain Assessment: No/denies pain    Home Living Family/patient expects to be discharged to:: Private residence Living Arrangements: Alone Available Help at Discharge: Family;Available PRN/intermittently Type of Home: House Home Access: Stairs to enter Entrance Stairs-Rails: Right;Left;Can reach both Entrance Stairs-Number of Steps: 2   Home Layout: One level Home Equipment: Cane - single point;Hand held shower head;Grab bars - tub/shower;Shower seat      Prior Function Prior Level of Function : Independent/Modified Independent;Driving             Mobility Comments: Has been using RW for ambulation within the home. ADLs Comments: Independent ADL; assist IADL's.     Extremity/Trunk Assessment   Upper Extremity Assessment Upper Extremity Assessment: Defer to OT evaluation    Lower Extremity Assessment Lower Extremity Assessment: Generalized weakness    Cervical / Trunk Assessment Cervical / Trunk Assessment: Normal  Communication   Communication Communication: Impaired Factors Affecting Communication: Reduced clarity of speech    Cognition Arousal: Alert Behavior During Therapy: WFL for tasks assessed/performed   PT - Cognitive impairments: No apparent impairments                         Following commands: Intact       Cueing Cueing Techniques: Verbal cues     General Comments      Exercises     Assessment/Plan    PT Assessment Patient needs continued PT services  PT Problem List Decreased strength;Decreased activity tolerance;Decreased balance;Decreased  mobility       PT Treatment Interventions DME instruction;Gait training;Stair training;Functional mobility training;Therapeutic activities;Therapeutic exercise;Balance training;Patient/family education    PT Goals (Current goals can be found in the Care Plan section)  Acute Rehab PT Goals Patient Stated Goal: return home with family to assist PT Goal Formulation: With patient/family Time For Goal Achievement: 05/27/23 Potential to Achieve Goals: Good    Frequency Min 3X/week     Co-evaluation PT/OT/SLP Co-Evaluation/Treatment: Yes Reason for Co-Treatment: To address functional/ADL transfers PT goals addressed during session: Mobility/safety with mobility;Balance;Proper use of DME OT goals addressed during session: ADL's and self-care       AM-PAC PT "6 Clicks" Mobility  Outcome Measure Help needed turning from your back to your side while in a flat bed without using bedrails?: A Little Help needed moving from lying on your back to sitting on the side of a flat bed without using bedrails?: A Little Help needed moving to and from a bed to a chair (including a wheelchair)?: A Little Help needed standing up from a chair using your arms (e.g., wheelchair or bedside chair)?: A Little Help needed to walk in hospital room?: A Lot Help needed climbing 3-5 steps with a railing? : A Lot 6 Click Score: 16    End of Session Equipment Utilized During Treatment: Oxygen Activity Tolerance: Patient tolerated treatment well;Patient limited by  fatigue Patient left: in chair;with call bell/phone within reach Nurse Communication: Mobility status PT Visit Diagnosis: Unsteadiness on feet (R26.81);Other abnormalities of gait and mobility (R26.89);Muscle weakness (generalized) (M62.81)    Time: 1914-7829 PT Time Calculation (min) (ACUTE ONLY): 30 min   Charges:   PT Evaluation $PT Eval Moderate Complexity: 1 Mod PT Treatments $Therapeutic Activity: 23-37 mins PT General Charges $$ ACUTE PT  VISIT: 1 Visit         12:25 PM, 05/13/23 Walton Guppy, MPT Physical Therapist with [facility] [phone] office [phone] mobile phone

## 2023-05-13 NOTE — NC FL2 (Signed)
 [place]  MEDICAID [id number] LEVEL OF CARE FORM     IDENTIFICATION  Patient Name: [name] Birthdate: 1950-11-25 Sex: male Admission Date (Current Location): 05/11/2023  [facility] and [place] Number:  [employer] and Address:  [facility],  [address], [name] [address]      Provider Number: [id number]  Attending Physician Name and Address:  Colin Dawley, MD  Relative Name and Phone Number:       Current Level of Care: Hospital Recommended Level of Care: Skilled Nursing Facility Prior Approval Number:    Date Approved/Denied:   PASRR Number:    Discharge Plan: SNF    Current Diagnoses: Patient Active Problem List   Diagnosis Date Noted   Ground-level fall 05/11/2023   Closed fracture of lesser trochanter of right femur (HCC) 05/11/2023   Septic shock (HCC) 05/11/2023   Acute encephalopathy 05/11/2023   AKI (acute kidney injury) (HCC) 05/11/2023   Right lower lobe pulmonary infiltrate 03/26/2023   Acute respiratory failure with hypoxia (HCC) 03/26/2023   Pressure injury of skin 03/26/2023   Urinary tract infection 03/26/2023   Aspiration pneumonia of right lower lobe (HCC) 03/25/2023   Head injury 03/24/2023   Critical polytrauma 03/24/2023   Delirium    Anuria 01/18/2018   Acute retention of urine 01/17/2018   Acute metabolic encephalopathy 01/15/2018   Dehydration 01/15/2018   Chronic pain syndrome 01/15/2018   Scalp laceration    Metastasis to lung (HCC) 10/19/2013   Mucositis due to antineoplastic therapy 06/10/2013   Protein-calorie malnutrition, severe (HCC) 06/10/2013   Rash 06/10/2013   Gastrostomy tube dyslodgement - replaced 05/06/2013 05/06/2013   Gastrostomy in place [facility]) 04/04/2013   Tonsil cancer (HCC) 03/22/2013   Diabetic neuropathy, painful (HCC) 03/22/2013   Diabetes mellitus, type 2 (HCC) 10/18/2012   Other and unspecified hyperlipidemia 10/18/2012   Obesity, unspecified 10/18/2012    Orientation RESPIRATION  BLADDER Height & Weight     Self, Time, Situation, Place  O2 (see dc summary) Continent Weight: 167 lb 15.9 oz (76.2 kg) Height:  6\' 2"  (188 cm)  BEHAVIORAL SYMPTOMS/MOOD NEUROLOGICAL BOWEL NUTRITION STATUS      Continent Diet (see dc summary)  AMBULATORY STATUS COMMUNICATION OF NEEDS Skin   Extensive Assist Verbally PU Stage and Appropriate Care (L buttocks)   PU Stage 2 Dressing: Daily                   Personal Care Assistance Level of Assistance  Bathing, Feeding, Dressing Bathing Assistance: Limited assistance Feeding assistance: Independent Dressing Assistance: Limited assistance     Functional Limitations Info  Sight, Hearing, Speech Sight Info: Adequate Hearing Info: Adequate Speech Info: Impaired    SPECIAL CARE FACTORS FREQUENCY  PT (By licensed PT), OT (By licensed OT)     PT Frequency: 5x week OT Frequency: 5x week            Contractures Contractures Info: Not present    Additional Factors Info  Code Status, Allergies Code Status Info: DNR Allergies Info: Neurontin, Buspirone, Dilaudid            Current Medications (05/13/2023):  This is the current hospital active medication list Current Facility-Administered Medications  Medication Dose Route Frequency Provider Last Rate Last Admin   acetaminophen  (TYLENOL ) tablet 1,000 mg  1,000 mg Oral Q6H PRN Emokpae, Courage, MD   1,000 mg at 05/12/23 2120   albuterol  (PROVENTIL ) (2.5 MG/3ML) 0.083% nebulizer solution 2.5 mg  2.5 mg Nebulization Q4H PRN Emokpae,  Courage, MD       ALPRAZolam  (XANAX ) tablet 0.5 mg  0.5 mg Oral TID Quintella Buck, Courage, MD   0.5 mg at 05/13/23 1013   ALPRAZolam  (XANAX ) tablet 1 mg  1 mg Oral QHS Emokpae, Courage, MD       Chlorhexidine  Gluconate Cloth 2 % PADS 6 each  6 each Topical Q0600 Emokpae, Courage, MD   6 each at 05/13/23 0558   citalopram  (CELEXA ) tablet 40 mg  40 mg Oral Daily Emokpae, Courage, MD   40 mg at 05/13/23 1013   feeding supplement (ENSURE ENLIVE / ENSURE  PLUS) liquid 237 mL  237 mL Oral BID BM Emokpae, Courage, MD   237 mL at 05/13/23 0805   LORazepam  (ATIVAN ) tablet 0.25 mg  0.25 mg Oral QHS PRN Colin Dawley, MD   0.25 mg at 05/12/23 2121   melatonin tablet 6 mg  6 mg Oral QHS PRN Colin Dawley, MD   6 mg at 05/12/23 2121   meropenem (MERREM) 1 g in sodium chloride  0.9 % 100 mL IVPB  1 g Intravenous Q8H Emokpae, Courage, MD       norepinephrine  (LEVOPHED ) 4mg  in (0.016 mg/mL) premix infusion  2-10 mcg/min Intravenous Titrated Emokpae, Courage, MD   Stopped at 05/12/23 2313   polyethylene glycol (MIRALAX  / GLYCOLAX ) packet 17 g  17 g Oral Daily PRN Emokpae, Courage, MD       rOPINIRole (REQUIP) tablet 1 mg  1 mg Oral QHS Emokpae, Courage, MD       rosuvastatin  (CRESTOR ) tablet 10 mg  10 mg Oral QHS Emokpae, Courage, MD   10 mg at 05/12/23 2120   sodium chloride  flush (NS) 0.9 % injection 3 mL  3 mL Intravenous Q12H Emokpae, Courage, MD   10 mL at 05/13/23 0805     Discharge Medications: Please see discharge summary for a list of discharge medications.  Relevant Imaging Results:  Relevant Lab Results:   Additional Information SSN: [id number], [place]

## 2023-05-13 NOTE — Plan of Care (Signed)
  Problem: Acute Rehab PT Goals(only PT should resolve) Goal: Pt Will Go Supine/Side To Sit Outcome: Progressing Flowsheets (Taken 05/13/2023 1227) Pt will go Supine/Side to Sit: with supervision Goal: Patient Will Transfer Sit To/From Stand Outcome: Progressing Flowsheets (Taken 05/13/2023 1227) Patient will transfer sit to/from stand: with supervision Goal: Pt Will Transfer Bed To Chair/Chair To Bed Outcome: Progressing Flowsheets (Taken 05/13/2023 1227) Pt will Transfer Bed to Chair/Chair to Bed: with supervision Goal: Pt Will Ambulate Outcome: Progressing Flowsheets (Taken 05/13/2023 1227) Pt will Ambulate:  50 feet  with contact guard assist  with minimal assist  with rolling walker   12:27 PM, 05/13/23 Walton Guppy, MPT Physical Therapist with [facility] [phone] office [phone] mobile phone

## 2023-05-13 NOTE — Evaluation (Signed)
 Occupational Therapy Evaluation Patient Details Name: [name] MRN: [id number] DOB: 12/22/1950 Today's Date: 05/13/2023   History of Present Illness   [name] is a 73 y.o. 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 He returns after another ground level fall today. His speech is hard to understand, but tells me that he hit his head.  Denies any headache or facial pain.  Denies syncopal episode, chest pain, palpitations.  Denies any other sites of pain although complained of pain in the right hip on ED evaluation.  He is not sure why he keeps falling but complains of jerking movements and trouble with coordination.  Denies any recent fever, chills, cough/cold, abdominal pain, nausea, vomiting, diarrhea, dysuria or other urinary changes. (per MD)     Clinical Impressions Pt agreeable to OT and PT co-evaluation. Pt is independent with ADL's at basline. Mod A to max A likely for lower body ADL's due to R LE limited movement from recent fracture. Pt demonstrates good B UE strength. Supervision to CGA for bed mobility and CGA to min A for ambulation with RW. Pt was left in the chair with call bell within reach. Pt placed on 3 LPM supplemental O2 due to desaturation during movement to mid 70's without O2 and continued desaturation even on 2LPM.     If plan is discharge home, recommend the following:   A little help with walking and/or transfers;A lot of help with bathing/dressing/bathroom;Assistance with  cooking/housework;Assist for transportation;Help with stairs or ramp for entrance     Functional Status Assessment   Patient has had a recent decline in their functional status and demonstrates the ability to make significant improvements in function in a reasonable and predictable amount of time.     Equipment Recommendations   None recommended by OT              Precautions/Restrictions   Precautions Precautions: Fall Recall of Precautions/Restrictions: Intact Restrictions Weight Bearing Restrictions Per Provider Order: Yes RLE Weight Bearing Per Provider Order: Weight bearing as tolerated     Mobility Bed Mobility Overal bed mobility: Needs Assistance Bed Mobility: Supine to Sit     Supine to sit: Supervision, Contact guard     General bed mobility comments: mild labored effort    Transfers Overall transfer level: Needs assistance Equipment used: Rolling walker (2 wheels) Transfers: Sit to/from Stand, Bed to chair/wheelchair/BSC Sit to Stand: Min assist     Step pivot transfers: Contact guard assist, Min assist     General transfer comment: labored movement      Balance Overall balance assessment: Needs assistance Sitting-balance support: Feet supported, No upper extremity supported Sitting balance-Leahy Scale: Good Sitting balance - Comments: seated at EOB   Standing balance support: During functional activity, Reliant on assistive device for balance, Bilateral upper extremity supported Standing balance-Leahy Scale: Fair Standing balance comment: using RW                           ADL either performed or assessed with clinical judgement   ADL Overall ADL's : Needs  assistance/impaired             Lower Body Bathing: Moderate assistance;Sitting/lateral leans   Upper Body Dressing : Set up;Sitting   Lower Body Dressing: Moderate assistance;Sitting/lateral leans Lower Body Dressing Details (indicate cue type and reason):  Assisted needed to manage R LE sock; good with L LE sock today seated in chair. Toilet Transfer: Minimal assistance;Ambulation;Rolling walker (2 wheels);Contact guard assist Toilet Transfer Details (indicate cue type and reason): Simulated via ambulation in the room with RW Toileting- Clothing Manipulation and Hygiene: Moderate assistance;Minimal assistance;Sitting/lateral lean       Functional mobility during ADLs: Contact guard assist;Minimal assistance;Rolling walker (2 wheels)       Vision Baseline Vision/History: 0 No visual deficits Ability to See in Adequate Light: 0 Adequate Patient Visual Report: No change from baseline Vision Assessment?: No apparent visual deficits     Perception Perception: Not tested       Praxis Praxis: Not tested       Pertinent Vitals/Pain Pain Assessment Pain Assessment: No/denies pain     Extremity/Trunk Assessment Upper Extremity Assessment Upper Extremity Assessment: Overall WFL for tasks assessed   Lower Extremity Assessment Lower Extremity Assessment: Defer to PT evaluation   Cervical / Trunk Assessment Cervical / Trunk Assessment: Normal   Communication Communication Communication: Impaired Factors Affecting Communication: Reduced clarity of speech   Cognition Arousal: Alert Behavior During Therapy: WFL for tasks assessed/performed Cognition: History of cognitive impairments                               Following commands: Intact       Cueing  General Comments   Cueing Techniques: Verbal cues                 Home Living Family/patient expects to be discharged to:: Private residence Living Arrangements: Alone Available Help at Discharge: Family;Available PRN/intermittently Type of Home: House Home Access: Stairs to enter [employer] of Steps: 2 Entrance Stairs-Rails: Right;Left;Can reach both Home Layout: One level     Bathroom Shower/Tub: Producer, television/film/video:  Handicapped height Bathroom Accessibility: Yes   Home Equipment: Cane - single point;Hand held shower head;Grab bars - tub/shower;Shower seat          Prior Functioning/Environment Prior Level of Function : Independent/Modified Independent;Driving             Mobility Comments: Has been using RW for ambulation within the home. ADLs Comments: Independent ADL; assist IADL's.    OT Problem List: Decreased activity tolerance;Impaired balance (sitting and/or standing);Decreased range of motion;Decreased strength   OT Treatment/Interventions: Self-care/ADL training;Therapeutic exercise;Therapeutic activities;Patient/family education;Balance training      OT Goals(Current goals can be found in the care plan section)   Acute Rehab OT Goals Patient Stated Goal: improve function OT Goal Formulation: With patient Time For Goal Achievement: 05/27/23 Potential to Achieve Goals: Good   OT Frequency:  Min 2X/week    Co-evaluation PT/OT/SLP Co-Evaluation/Treatment: Yes Reason for Co-Treatment: To address functional/ADL transfers   OT goals addressed during session: ADL's and self-care                       End of Session Equipment Utilized During Treatment: Rolling walker (2 wheels)  Activity Tolerance: Patient tolerated treatment well Patient left: in chair;with call bell/phone within reach  OT Visit Diagnosis: Unsteadiness on feet (R26.81);Other abnormalities of gait and mobility (R26.89);Muscle weakness (generalized) (M62.81)  Time: 1610-9604 OT Time Calculation (min): 16 min Charges:  OT General Charges $OT Visit: 1 Visit OT Evaluation $OT Eval Low Complexity: 1 Low  Twila Rappa OT, MOT  Thurnell Floss 05/13/2023, 9:52 AM

## 2023-05-13 NOTE — Progress Notes (Signed)
 [facility] PROGRESS NOTE  [name] (DOB: Aug 28, 1950) ZOX:[id number] PCP: Minus Amel, MD  Brief Narrative: [name] is a 73 y.o. 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 He returned to the ED 4/28 after another fall, found to have a new right lesser trochanter femur fracture. He developed lethargy due to evolving septic shock due to UTI in the ED for which antibiotics were broadened to vancomycin  and zosyn  and IV fluid resuscitation were administered. He has required peripheral levophed . VBG reassuring, patient is known DNR/DNI status and NIPPV may be contraindicated with facial fractures.   Subjective: -Less confused, requires assistance to get out of bed - Daughter at bedside, questions answered - Swallowing difficulties persist - Off Levophed  drip  Objective: BP 101/66   Pulse 94   Temp 97.9 F (36.6 C) (Oral)   Resp 12   Ht 6\' 2"  (1.88 m)   Wt 76.2 kg   SpO2 95%   BMI 21.57 kg/m    Physical Exam Gen:- Awake Alert, in no acute distress  HEENT:- [place].AT,  Hardening of right side of neck consistent with hx radiation Neck-Supple Neck,No JVD,.  Lungs-  CTAB , fair air movement bilaterally  CV- S1, S2 normal, irregular Abd-  +ve B.Sounds, Abd Soft, No tenderness,    Extremity/Skin:- No  edema,   good pedal pulses  Psych-affect is appropriate, oriented x3 Neuro-generalized weakness , no new focal deficits, no tremors  Assessment & Plan: 1) severe sepsis with septic shock due to complicated ESBL  Klebsiella UTI:   -Urine culture from 05/11/2023 with ESBL Klebsiella -Blood cultures from 05/11/2023 NGTD -Weaned off IV Levophed  on 05/13/2023 --Vancomycin  discontinued, as MRSA is negative -Previously received Zosyn  and Rocephin  - Start meropenem on 05/13/2023 per sensitivities on Klebsiella  2)Recurrent Falls:  Recurrent falls---PTA pt lived at home and did very poorly, patient has significant limitations with mobility related ADLs- this patient needs to continue to be monitored in the hospital until a SNF bed is obtained as she is not safe to go home with her current physcical limitations -- Physical therapy eval appreciated recommends SNF rehab  3)New right lesser trochanter femur fracture: Recent trauma admission with orbital fracture, right calvarium fracture, right periprosthetic hip fracture. This admission underwent imaging including CT head / C spine and chest x-ray with no acute injuries, pelvic x-ray demonstrating the new right lesser trochanteric hip fracture and a healing right periprosthetic hip fracture. - Orthopedics consulted, appreciate recommendations.    4)History of facial/orbital fractures: CT head/cervical spine this admission shows no acute intracranial or cervical spine findings and stable right-sided zygomaticomaxillary complex fractures as described on last month's CTs. - Follow up with ENT as outpatient   5)Acute toxic/metabolic encephalopathy on chronic cognitive impairment: Due to septic shock, mild hypercarbia without respiratory acidosis. Benzodiazepine contributed, though this is improving now. Ammonia, TSH, vitamin B12 noted to be normal. CT head nonacute - Hypercarbia beginning to improve and his mental status has improved considerably.  - Mentation improving significantly  6)Acute hypercarbic respiratory failure: Due to AMS-related hypoventilation which has improved. No indication for further Tx at this time. Pt would not  be candidate for NIPPV with facial  fractures nor intubation based on goals of care.  - Can supplement O2 to maintain normal WOB, though would avoid over-supplementation.   7)Dysphagia--- speech therapist evaluation appreciated MBBS shows lots of residue and he is aspirating thin and NTL. Aspiration is silent. Throat clear is effective  - 8)AKI----acute kidney injury   -- creatinine on admission= 1.43 ,  -baseline creatinine = 1.0  , creatinine is now= 0.80  , --Renal ultrasound from 05/12/2023 without obstructive uropathy --- renally adjust medications, avoid nephrotoxic agents / dehydration  / hypotension  9)Metastatic tonsillar SCC: mets to lung, s/p chemoradiation previously NED in '19; lost to f/u with oncology.  - Reestablish care with oncology, Dr. Cheree Cords after discharge.    10)Urethral stricture:  -Renal ultrasound showed Distended urinary bladder with debri  - Continue intermittent catheterization q6h.   11)Diet-controlled NIDT2DM: A1 is 5.3 reflecting excellent diabetic control PTA Use Novolog /Humalog Sliding scale insulin  with Accu-Cheks/Fingersticks as ordered   12)HLD:  - Continue rosuvastatin   13)Neuropathy: Noted, may also contribute to falls.   14)Anxiety/mood disorder: --History of long-term high-dose Xanax  use, avoid abrupt discontinuation -Patient has resumed at lower dosage =-Continue citalopram   Colin Dawley, MD [facility] [email or web address] 05/13/2023, 5:09 PM

## 2023-05-13 NOTE — TOC Progression Note (Signed)
 Transition of Care [facility]) - Progression Note    Patient Details  Name: [name] MRN: [id number] Date of Birth: 16-Dec-1950  [facility] CM/SW Contact  Grandville Lax, [place] Phone Number: 05/13/2023, 12:50 PM  Clinical Narrative:    PT note in with recommendation for SNF. TOC spoke with pt and daughter yesterday, they were agreeable to referral being sent out. CSW completed referral and sent out to facilities for review. TOC to follow.   Expected Discharge Plan: Skilled Nursing Facility Barriers to Discharge: Continued Medical Work up  Expected Discharge Plan and Services In-house Referral: Clinical Social Work   Post Acute Care Choice: Skilled Nursing Facility Living arrangements for the past 2 months: Single Family Home                                       Social Determinants of Health (SDOH) Interventions SDOH Screenings   Food Insecurity: No Food Insecurity (05/12/2023)  Housing: High Risk (05/12/2023)  Transportation Needs: No Transportation Needs (05/12/2023)  Utilities: Not At Risk (05/12/2023)  Social Connections: Socially Isolated (05/12/2023)  Tobacco Use: Low Risk  (05/11/2023)    Readmission Risk Interventions     No data to display

## 2023-05-13 NOTE — Evaluation (Signed)
 Modified Barium Swallow Study  Patient Details  Name: [name] MRN: [id number] Date of Birth: 09-20-1950  Today's Date: 05/13/2023  Modified Barium Swallow completed.  Full report located under Chart Review in the Imaging Section.  History of Present Illness [name] is a 73 y.o. 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 He returned to the ED 4/28 after another fall, found to have a new right lesser trochanter femur fracture. He developed lethargy due to evolving septic shock due to UTI in the ED for which antibiotics were broadened to vancomycin  and zosyn  and IV fluid resuscitation were administered. He has required peripheral levophed . VBG reassuring, patient is known DNR/DNI status and NIPPV may be contraindicated with facial fractures. BSE requested. Pt last had MBS 04/26/2015 with recommendation for D3/NTL and was seen for outpatient dysphagia therapy.   Clinical Impression Pt presents with mild oral and moderately severe pharyngeal dysphagia. Pt demonstrated very similar presentation on MBSS completed in 2017, with slight worsening of severity on today's exam. Pt with trace to moderate amounts of silent aspiration of thin liquids. NTL was not initially silently aspirated, however majority of bolus of NTL remains in the pharynx after the initial swallow and trace to moderate amounts are then aspirated after the swallow. Oral stage is characterized by slightly disorganized chewing, decreased tongue control and mild oral residue  after the swallow. Pharyngeal stage is characterized by decreased base of tongue retraction, little to no epiglottic deflection, very diminished pharyngeal stripping wave, and decreased laryngeal vestibule closure. Impairments result in moderate to severe amounts of diffuse pharyngeal residue; multiple swallows do not result in complete clearance of pharyngeal residue. As pharyngeal residue mixes with secretions trace amounts fall to the vocal cords, remain there and are often silently aspirated. Cued throat clear was very effective in clearing penetrates and aspirates however throat clear was not reflexively produced. Pt has been consuming a soft diet with thin liquids since 2017 when he stopped attending OP [facility]. Per pt's daughter's report Pt has only had PNA twice since that time. Barium tablet was administered with thin liquids (this is how Pt takes them at home), however, pill with stasis in the valleculae and despite multiple administrations of varying textures and maneuvers attempted tablet remained in the valleculae and was not visualized passing through the pharynx-- the exact scenario also described on MBSS in 2017. Discussed findings at length with [name]. Despite the slight decrease in volume and frequency of aspiration with NTL, Pt still continues to consistently aspirate some NTL residue. Pt strongly prefers to NOT be on NTL for QOL purposes. Recommend continue with mech soft/D3 diet and thin liquids - recommend small bites/sips, followed by throat clear and multiple dry swallows after each bite/sip. Patient and daughter understand that despite least restrictive diet and strategy usage Pt remains at very high risk for aspiration. Recommend crush meds and administer with puree. Above reviewed with MD, RN and [name]. ST will continue to follow acutely Factors that may increase risk of adverse event in presence of aspiration [name] & [name] 2021):    Swallow Evaluation  Recommendations Recommendations: PO diet  PO Diet Recommendation: Dysphagia 3 (Mechanical soft);Thin liquids (Level 0) Liquid Administration via: Cup;Straw (small sips) Medication Administration: Crushed with puree Supervision: Patient able to self-feed;Full supervision/cueing for swallowing strategies Swallowing strategies  : Slow rate;Small bites/sips;Multiple dry swallows after each bite/sip;Clear throat intermittently;Avoid mixed consistencies Postural changes: Position pt fully upright for meals Oral care recommendations: Oral care BID (2x/day)    [name] H. [name], CCC-SLP Speech Language Pathologist   [name] 05/13/2023,9:48 PM

## 2023-05-13 NOTE — Plan of Care (Signed)
  Problem: Acute Rehab OT Goals (only OT should resolve) Goal: Pt. Will Perform Grooming Flowsheets (Taken 05/13/2023 0955) Pt Will Perform Grooming:  with modified independence  standing Goal: Pt. Will Perform Lower Body Bathing Flowsheets (Taken 05/13/2023 0955) Pt Will Perform Lower Body Bathing:  with modified independence  sitting/lateral leans  with adaptive equipment Goal: Pt. Will Perform Lower Body Dressing Flowsheets (Taken 05/13/2023 0955) Pt Will Perform Lower Body Dressing:  with modified independence  with adaptive equipment  sitting/lateral leans Goal: Pt. Will Transfer To Toilet Flowsheets (Taken 05/13/2023 [id number]) Pt Will Transfer to Toilet: with modified independence Goal: Pt. Will Perform Toileting-Clothing Manipulation Flowsheets (Taken 05/13/2023 0955) Pt Will Perform Toileting - Clothing Manipulation and hygiene:  with modified independence  with adaptive equipment  Halim Surrette OT, MOT

## 2023-05-14 DIAGNOSIS — W1830XA Fall on same level, unspecified, initial encounter: Secondary | ICD-10-CM | POA: Diagnosis not present

## 2023-05-14 DIAGNOSIS — R6521 Severe sepsis with septic shock: Secondary | ICD-10-CM | POA: Diagnosis not present

## 2023-05-14 NOTE — Plan of Care (Signed)
  Problem: Education: Goal: Knowledge of General Education information will improve Description: Including pain rating scale, medication(s)/side effects and non-pharmacologic comfort measures Outcome: Progressing   Problem: Health Behavior/Discharge Planning: Goal: Ability to manage health-related needs will improve Outcome: Progressing   Problem: Clinical Measurements: Goal: Ability to maintain clinical measurements within normal limits will improve Outcome: Progressing Goal: Diagnostic test results will improve Outcome: Progressing Goal: Respiratory complications will improve Outcome: Progressing Goal: Cardiovascular complication will be avoided Outcome: Progressing   Problem: Nutrition: Goal: Adequate nutrition will be maintained Outcome: Progressing   Problem: Coping: Goal: Level of anxiety will decrease Outcome: Progressing   Problem: Elimination: Goal: Will not experience complications related to bowel motility Outcome: Progressing Goal: Will not experience complications related to urinary retention Outcome: Progressing   

## 2023-05-14 NOTE — Progress Notes (Signed)
 [facility] PROGRESS NOTE  [name] (DOB: 1950-11-27) ZOX:[id number] PCP: Minus Amel, MD  Brief Narrative: [name] is a 73 y.o. 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 He returned to the ED 4/28 after another fall, found to have a new right lesser trochanter femur fracture. He developed lethargy due to evolving septic shock due to UTI in the ED for which antibiotics were broadened to vancomycin  and zosyn  and IV fluid resuscitation were administered. He has required peripheral levophed . VBG reassuring, patient is known DNR/DNI status and NIPPV may be contraindicated with facial fractures.   Subjective: - Was up in the chair for hall back in bed - Requesting In-N-Out catheterization -Oral intake is fair - Okay to discharge to SNF rehab when bed is available and catheterization can be obtained  Objective: BP (!) 165/97   Pulse (!) 104   Temp 98.3 F (36.8 C) (Oral)   Resp 12   Ht 6\' 2"  (1.88 m)   Wt 76.2 kg   SpO2 95%   BMI 21.57 kg/m    Physical Exam Gen:- Awake Alert, in no acute distress  HEENT:- [place].AT,  Hardening of right side of neck consistent with hx radiation Neck-Supple Neck,No JVD,.  Lungs-  CTAB , fair air movement bilaterally  CV- S1, S2 normal, irregular Abd-  +ve B.Sounds, Abd Soft, No tenderness,    Extremity/Skin:- No  edema,   good pedal pulses  Psych-affect is appropriate, oriented x3 Neuro-generalized weakness , no new focal deficits, no tremors  Assessment & Plan: 1) severe sepsis  with septic shock due to complicated ESBL Klebsiella UTI:   -Urine culture from 05/11/2023 with ESBL Klebsiella -Blood cultures from 05/11/2023 NGTD -Weaned off IV Levophed  on 05/13/2023 --Vancomycin  discontinued, as MRSA is negative -Previously received Zosyn  and Rocephin  - c/n  meropenem   (started on 05/13/2023) per sensitivities on Klebsiella  2)Recurrent Falls:  Recurrent falls---PTA pt lived at home and did very poorly, patient has significant limitations with mobility related ADLs- this patient needs to continue to be monitored in the hospital until a SNF bed is obtained as she is not safe to go home with her current physcical limitations -- Physical therapy eval appreciated recommends SNF rehab  3)New right lesser trochanter femur fracture: Recent trauma admission with orbital fracture, right calvarium fracture, right periprosthetic hip fracture. This admission underwent imaging including CT head / C spine and chest x-ray with no acute injuries, pelvic x-ray demonstrating the new right lesser trochanteric hip fracture and a healing right periprosthetic hip fracture. - Orthopedics consulted, appreciate recommendations.    4)History of facial/orbital fractures: CT head/cervical spine this admission shows no acute intracranial or cervical spine findings and stable right-sided zygomaticomaxillary complex fractures as described on last month's CTs. - Follow up with ENT as outpatient   5)Acute toxic/metabolic encephalopathy on chronic cognitive impairment: Due to septic shock, mild hypercarbia without respiratory acidosis. Benzodiazepine contributed, though this is improving now. Ammonia, TSH, vitamin B12 noted to be normal. CT head nonacute - Hypercarbia beginning to improve and his mental status has improved considerably.  - Mentation improving significantly  6)Acute hypercarbic respiratory failure: Due  to AMS-related hypoventilation which has improved. No indication for further Tx at this  time. Pt would not be candidate for NIPPV with facial fractures nor intubation based on goals of care.  - Can supplement O2 to maintain normal WOB, though would avoid over-supplementation.   7)Dysphagia--- speech therapist evaluation appreciated MBBS shows lots of residue and he is aspirating thin and NTL. Aspiration is silent. Throat clear is effective  - 8)AKI----acute kidney injury   -- creatinine on admission= 1.43 ,  -baseline creatinine = 1.0  , creatinine is now= 0.80  , --Renal ultrasound from 05/12/2023 without obstructive uropathy --- renally adjust medications, avoid nephrotoxic agents / dehydration  / hypotension  9)Metastatic tonsillar SCC: mets to lung, s/p chemoradiation previously NED in '19; lost to f/u with oncology.  - Reestablish care with oncology, Dr. Cheree Cords after discharge.    10)Urethral stricture:  -Renal ultrasound showed Distended urinary bladder with debri  - Continue intermittent catheterization q6h.   11)Diet-controlled NIDT2DM: A1 is 5.3 reflecting excellent diabetic control PTA Use Novolog /Humalog Sliding scale insulin  with Accu-Cheks/Fingersticks as ordered   12)HLD:  - Continue rosuvastatin   13)Neuropathy: Noted, may also contribute to falls.   14)Anxiety/mood disorder: --History of long-term high-dose Xanax  use, avoid abrupt discontinuation -Patient has resumed at lower dosage =-Continue citalopram   15)Urinary Retention----Requesting In-N-Out catheterization, change in and out catheterization scheduled to every 8 hours as needed  Colin Dawley, MD [facility] [email or web address] 05/14/2023, 6:24 PM

## 2023-05-14 NOTE — TOC Progression Note (Signed)
 Transition of Care [facility]) - Progression Note    Patient Details  Name: [name] MRN: [id number] Date of Birth: 24-Apr-1950  [facility]) CM/SW Contact  [place]ville Lax, [place] Phone Number: 05/14/2023, 11:05 AM  Clinical Narrative:    CSW spoke with pts daughter to update on bed offers. Pts daughter states that she will review with her children and will update CSW when they make a decision. CSW also reached out to facilities to see if there are private rooms as requested by pts daughter. SNF insurance [employer] is pending at this time and will need to have facility added once facility choice is made. TOC to follow.   Expected Discharge Plan: Skilled Nursing Facility Barriers to Discharge: Continued Medical Work up  Expected Discharge Plan and Services In-house Referral: Clinical Social Work   Post Acute Care Choice: Skilled Nursing Facility Living arrangements for the past 2 months: Single Family Home                                       Social Determinants of Health (SDOH) Interventions SDOH Screenings   Food Insecurity: No Food Insecurity (05/12/2023)  Housing: High Risk (05/12/2023)  Transportation Needs: No Transportation Needs (05/12/2023)  Utilities: Not At Risk (05/12/2023)  Social Connections: Socially Isolated (05/12/2023)  Tobacco Use: Low Risk  (05/11/2023)    Readmission Risk Interventions     No data to display

## 2023-05-14 NOTE — Plan of Care (Signed)

## 2023-05-15 ENCOUNTER — Inpatient Hospital Stay (HOSPITAL_COMMUNITY)

## 2023-05-15 DIAGNOSIS — R296 Repeated falls: Secondary | ICD-10-CM

## 2023-05-15 DIAGNOSIS — W1830XA Fall on same level, unspecified, initial encounter: Secondary | ICD-10-CM | POA: Diagnosis not present

## 2023-05-15 LAB — CBC
HCT: 34.6 % — ABNORMAL LOW (ref 39.0–52.0)
Hemoglobin: 10.7 g/dL — ABNORMAL LOW (ref 13.0–17.0)
MCH: 28.8 pg (ref 26.0–34.0)
MCHC: 30.9 g/dL (ref 30.0–36.0)
MCV: 93.3 fL (ref 80.0–100.0)
Platelets: 184 10*3/uL (ref 150–400)
RBC: 3.71 MIL/uL — ABNORMAL LOW (ref 4.22–5.81)
RDW: 14.9 % (ref 11.5–15.5)
WBC: 7.1 10*3/uL (ref 4.0–10.5)
nRBC: 0 % (ref 0.0–0.2)

## 2023-05-15 NOTE — TOC Progression Note (Signed)
 Transition of Care [facility]) - Progression Note    Patient Details  Name: [name] MRN: [id number] Date of Birth: [birth year]  [facility]) CM/SW Contact  Grandville Lax, [place] Phone Number: 05/15/2023, 1:04 PM  Clinical Narrative:    CSW spoke with pts daughter to review bed offers, at this time they accept be at [facility]. CSW updated Shana in admissions of this. Facility choice added to insurance auth, [place] approved at this time. TOC to follow.   Expected Discharge Plan: Skilled Nursing Facility Barriers to Discharge: Continued Medical Work up  Expected Discharge Plan and Services In-house Referral: Clinical Social Work   Post Acute Care Choice: Skilled Nursing Facility Living arrangements for the past 2 months: Single Family Home                                       Social Determinants of Health (SDOH) Interventions SDOH Screenings   Food Insecurity: No Food Insecurity (05/12/2023)  Housing: High Risk (05/12/2023)  Transportation Needs: No Transportation Needs (05/12/2023)  Utilities: Not At Risk (05/12/2023)  Social Connections: Socially Isolated (05/12/2023)  Tobacco Use: Low Risk  (05/11/2023)    Readmission Risk Interventions     No data to display

## 2023-05-15 NOTE — Plan of Care (Signed)

## 2023-05-15 NOTE — Progress Notes (Signed)
 Speech Language Pathology Treatment: Dysphagia  Patient Details Name: [name] MRN: [id number] DOB: 1950-07-16 Today's Date: 05/15/2023 Time: 5284-1324 SLP Time Calculation (min) (ACUTE ONLY): 19 min  Assessment / Plan / Recommendation Clinical Impression  Ongoing diagnostic dysphagia therapy provided today. Pt had just finished eating his D3 breakfast tray; Pt ate his entire tray. Pt did consume bites of mech soft and thin liquids for SLP and note similar presentation of wet vocal quality and multiple swallows. Pt with improved usage of throat clear strategy which SLP reinforced. Pt is now requiring 2-3L/mn O2 and RN reports O2 dropping with/during meals. Provided education and strategies including very slow rate, small bites and sips and throat clears and coughing after each sip followed by multiple dry swallows. Also recommend Pt take frequent breaks throughout meal to breathe. Pt continues to prefer to consume D3/thin diet with understanding of high risk of aspiration. Above to RN and MD  - ST will continue to follow acutely   HPI HPI: JESSERAY SZOKE is a 73 y.o. 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 He returned to the ED 4/28 after another fall, found to have a new right lesser trochanter femur fracture. He developed lethargy due to evolving septic shock due to UTI in the ED for which antibiotics were broadened to vancomycin  and zosyn  and IV fluid resuscitation were administered. He has required peripheral levophed . VBG reassuring,  patient is known DNR/DNI status and NIPPV may be contraindicated with facial fractures.      SLP Plan  Continue with current plan of care      Recommendations for follow up therapy are one component of a multi-disciplinary discharge planning process, led by the attending physician.  Recommendations may be updated based on patient status, additional functional criteria and insurance authorization.    Recommendations  Diet recommendations: Thin liquid;Dysphagia 3 (mechanical soft) Liquids provided via: Cup;Straw Medication Administration: Crushed with puree Supervision: Patient able to self feed Compensations: Multiple dry swallows after each bite/sip;Clear throat after each swallow;Slow rate;Small sips/bites Postural Changes and/or Swallow Maneuvers: Seated upright 90 degrees                  Oral care BID;Staff/trained caregiver to provide oral care     Dysphagia, unspecified (R13.10)     Continue with current plan of care    Marshella Tello H. Vergil Glasser, CCC-SLP Speech Language Pathologist  [name]  05/15/2023, 9:16 AM

## 2023-05-15 NOTE — Plan of Care (Signed)
  Problem: Education: Goal: Knowledge of General Education information will improve Description: Including pain rating scale, medication(s)/side effects and non-pharmacologic comfort measures Outcome: Progressing   Problem: Health Behavior/Discharge Planning: Goal: Ability to manage health-related needs will improve Outcome: Progressing   Problem: Clinical Measurements: Goal: Diagnostic test results will improve Outcome: Progressing Goal: Respiratory complications will improve Outcome: Progressing Goal: Cardiovascular complication will be avoided Outcome: Progressing   Problem: Activity: Goal: Risk for activity intolerance will decrease Outcome: Progressing   Problem: Nutrition: Goal: Adequate nutrition will be maintained Outcome: Progressing   Problem: Coping: Goal: Level of anxiety will decrease Outcome: Progressing   Problem: Elimination: Goal: Will not experience complications related to bowel motility Outcome: Progressing

## 2023-05-15 NOTE — Care Management Important Message (Signed)
 Important Message  Patient Details  Name: [name] MRN: [id number] Date of Birth: 1950/10/25   Important Message Given:  Yes - Medicare IM     [name] 05/15/2023, 12:50 PM

## 2023-05-15 NOTE — Progress Notes (Addendum)
 [facility] PROGRESS NOTE  [name] (DOB: 01-30-1950) OZH:[id number] PCP: Minus Amel, MD  Brief Narrative: [name] is a 73 y.o. 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 He returned to the ED 4/28 after another fall, found to have a new right lesser trochanter femur fracture. He developed lethargy due to evolving septic shock due to UTI in the ED for which antibiotics were broadened to vancomycin  and zosyn  and IV fluid resuscitation were administered. He has required peripheral levophed . VBG reassuring, patient is known DNR/DNI status and NIPPV may be contraindicated with facial fractures.   Subjective: - -Daughter at bedside, questions answered --patient and daughter insist that he will not go to SNF rehab unless he can get a private room -- Apparently private room is Not available at the SNF rehab until Monday, 05/18/2023 - Aspiration late last evening now requiring 2 L of oxygen repeat chest x-ray consistent with aspiration event  Objective: BP 130/64   Pulse 79   Temp 98.1 F (36.7 C) (Oral)   Resp (!) 22   Ht 6\' 2"  (1.88 m)   Wt 76.2 kg   SpO2 94%   BMI 21.57 kg/m    Physical Exam Gen:- Awake Alert, in no acute distress  HEENT:- [place].AT,  Hardening of right side of neck consistent with hx radiation Nose- [place] 2L/min Neck-Supple Neck,No JVD,.  Lungs-few rhonchi left more than right, fair air movement bilaterally  CV- S1, S2 normal, irregular Abd-  +ve B.Sounds, Abd Soft, No tenderness,    Extremity/Skin:-  No  edema,   good pedal pulses  Psych-affect is appropriate, oriented x3 Neuro-generalized weakness , no new focal deficits, no tremors  Assessment & Plan: 1) severe sepsis with septic shock due to complicated ESBL Klebsiella UTI:   -Urine culture from 05/11/2023 with ESBL Klebsiella -Blood cultures from 05/11/2023 NGTD -Weaned off IV Levophed  on 05/13/2023 --Vancomycin  discontinued, as MRSA is negative -Previously received Zosyn  and Rocephin  - c/n  meropenem   (started on 05/13/2023) per sensitivities on ESBL Klebsiella  2)Recurrent Falls:  Recurrent falls---PTA pt lived at home and did very poorly, patient has significant limitations with mobility related ADLs- this patient needs to continue to be monitored in the hospital until a SNF bed is obtained as she is not safe to go home with her current physcical limitations -- Physical therapy eval appreciated recommends SNF rehab  3)New right lesser trochanter femur fracture: Recent trauma admission with orbital fracture, right calvarium fracture, right periprosthetic hip fracture. This admission underwent imaging including CT head / C spine and chest x-ray with no acute injuries, pelvic x-ray demonstrating the new right lesser trochanteric hip fracture and a healing right periprosthetic hip fracture. - Orthopedics consulted, appreciate recommendations.    4)Dysphagia with recurrent Aspiration pneumonia--has radiation to the neck and head area previously resulting in strictures and sclerosis and dysphagia -speech therapist evaluation appreciated MBBS shows lots of residue and he is aspirating thin and NTL. Aspiration is silent. Throat clear is effective  -- Episode of aspiration on 05/14/2023 now requiring oxygen, repeat chest x-ray on 05/15/2023 consistent with aspiration pneumonia --Continue antibiotics as above #1  5)Acute toxic/metabolic encephalopathy on chronic cognitive impairment: Due to septic shock, mild hypercarbia without respiratory  acidosis. Benzodiazepine contributed, though this is improving now. Ammonia, TSH, vitamin B12 noted to be normal. CT head nonacute - Hypercarbia beginning to improve and his mental status has improved considerably.  - Mentation improving significantly  6)Acute hypoxic and hypercarbic respiratory failure: Due to AMS-related hypoventilation which has improved. No indication for further Tx at this time. Pt would not be candidate for NIPPV with facial fractures nor intubation based on goals of care.  - Can supplement O2 to maintain normal WOB, though would avoid over-supplementation. - As of 05/15/2023 requiring 2 L of oxygen via nasal cannula due to aspiration episode please see #4 above  7)History of facial/orbital fractures: CT head/cervical spine this admission shows no acute intracranial or cervical spine findings and stable right-sided zygomaticomaxillary complex fractures as described on last month's CTs. - Follow up with ENT as outpatient - 8)AKI----acute kidney injury   -- creatinine on admission= 1.43 ,  -baseline creatinine = 1.0  , creatinine is now= 0.80  , --Renal ultrasound from 05/12/2023 without obstructive uropathy --- renally adjust medications, avoid nephrotoxic agents / dehydration  / hypotension  9)Metastatic tonsillar SCC: mets to lung, s/p chemoradiation previously NED in '19; lost to f/u with oncology.  - Reestablish care with oncology, Dr. Cheree Cords after discharge.    10)Urethral stricture/Urinary Retention----Requesting In-N-Out catheterization, change in and out catheterization scheduled to every 8 hours as needed -Renal ultrasound showed Distended urinary bladder with debri  - Continue intermittent catheterization q6h.   11)Diet-controlled NIDT2DM: A1 is 5.3 reflecting excellent diabetic control PTA Use Novolog /Humalog Sliding scale insulin  with Accu-Cheks/Fingersticks as ordered   12)HLD:  - Continue rosuvastatin   13)Neuropathy: Noted, may also contribute to  falls.   14)Anxiety/mood disorder: --History of long-term high-dose Xanax  use, avoid abrupt discontinuation C/n xanax  at lower dosage =-Continue citalopram   15) generalized weakness and deconditioning and disposition---  -Physical therapy eval appreciated recommends SNF rehab --patient and daughter insist that he will not go to SNF rehab unless he can get a private room -- Apparently private room is Not available at the SNF rehab until Monday, 05/18/2023  Colin Dawley, MD [facility] [email or web address] 05/15/2023, 5:45 PM

## 2023-05-16 DIAGNOSIS — R296 Repeated falls: Secondary | ICD-10-CM | POA: Diagnosis not present

## 2023-05-16 DIAGNOSIS — W1830XA Fall on same level, unspecified, initial encounter: Secondary | ICD-10-CM | POA: Diagnosis not present

## 2023-05-16 LAB — CULTURE, BLOOD (ROUTINE X 2)
Culture: NO GROWTH
Culture: NO GROWTH
Special Requests: ADEQUATE
Special Requests: ADEQUATE

## 2023-05-16 NOTE — Progress Notes (Signed)
 [facility] PROGRESS NOTE  [name] (DOB: 1950/04/29) ZOX:[id number] PCP: Minus Amel, MD  Brief Narrative: [name] is a 73 y.o. 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 He returned to the ED 4/28 after another fall, found to have a new right lesser trochanter femur fracture. He developed lethargy due to evolving septic shock due to UTI in the ED for which antibiotics were broadened to vancomycin  and zosyn  and IV fluid resuscitation were administered. He has required peripheral levophed . VBG reassuring, patient is known DNR/DNI status and NIPPV may be contraindicated with facial fractures.   Subjective: - --Continues to require 2 L of oxygen via nasal cannula --cough and dyspnea is not worse - Eating and drinking okay  Objective: BP 120/71   Pulse 80   Temp 99.2 F (37.3 C) (Oral)   Resp 12   Ht 6\' 2"  (1.88 m)   Wt 76.2 kg   SpO2 94%   BMI 21.57 kg/m    Physical Exam Gen:- Awake Alert, in no acute distress  HEENT:- [place].AT,  Hardening of right side of neck consistent with hx radiation Nose- [place] 2L/min Neck-Supple Neck,No JVD,.  Lungs-improving air movement, no wheezing CV- S1, S2 normal, irregular Abd-  +ve B.Sounds, Abd Soft, No tenderness,    Extremity/Skin:- No  edema,   good pedal pulses  Psych-affect is appropriate, oriented x3 Neuro-generalized weakness , no new focal deficits, no tremors  Assessment & Plan: 1) severe sepsis with septic shock due to complicated ESBL Klebsiella UTI:   -Urine  culture from 05/11/2023 with ESBL Klebsiella -Blood cultures from 05/11/2023 NGTD -Weaned off IV Levophed  on 05/13/2023 --Vancomycin  discontinued, as MRSA is negative -Previously received Zosyn  and Rocephin  - c/n  meropenem   (started on 05/13/2023) per sensitivities on ESBL Klebsiella  2)Recurrent Falls:  Recurrent falls---PTA pt lived at home and did very poorly, patient has significant limitations with mobility related ADLs- this patient needs to continue to be monitored in the hospital until a SNF bed is obtained as she is not safe to go home with her current physcical limitations -- Physical therapy eval appreciated recommends SNF rehab  3)New right lesser trochanter femur fracture: Recent trauma admission with orbital fracture, right calvarium fracture, right periprosthetic hip fracture. This admission underwent imaging including CT head / C spine and chest x-ray with no acute injuries, pelvic x-ray demonstrating the new right lesser trochanteric hip fracture and a healing right periprosthetic hip fracture. - Orthopedics consulted, appreciate recommendations.    4)Dysphagia with recurrent Aspiration pneumonia--has radiation to the neck and head area previously resulting in strictures and sclerosis and dysphagia -speech therapist evaluation appreciated MBBS shows lots of residue and he is aspirating thin and NTL. Aspiration is silent. Throat clear is effective  -- Episode of aspiration on 05/14/2023 now requiring oxygen, repeat chest x-ray on 05/15/2023 consistent with aspiration pneumonia --Continue Meropenem  as above #1   5)Acute toxic/metabolic encephalopathy on chronic cognitive impairment: Due to septic shock, mild hypercarbia without respiratory acidosis. Benzodiazepine contributed, though this is improving now. Ammonia, TSH, vitamin B12 noted to be normal. CT head nonacute - Hypercarbia beginning to improve and his  mental status has improved considerably.  - Mentation improving  significantly  6)Acute hypoxic and hypercarbic respiratory failure: Due to AMS-related hypoventilation which has improved. No indication for further Tx at this time. Pt would not be candidate for NIPPV with facial fractures nor intubation based on goals of care.  - Can supplement O2 to maintain normal WOB, though would avoid over-supplementation. - As of 05/16/2023 requiring 2 L of oxygen via nasal cannula due to aspiration episode please see #4 above  7)History of facial/orbital fractures: CT head/cervical spine this admission shows no acute intracranial or cervical spine findings and stable right-sided zygomaticomaxillary complex fractures as described on last month's CTs. - Follow up with ENT as outpatient - 8)AKI----acute kidney injury   -- creatinine on admission= 1.43 ,  -baseline creatinine = 1.0  , creatinine is now= 0.80  , --Renal ultrasound from 05/12/2023 without obstructive uropathy --- renally adjust medications, avoid nephrotoxic agents / dehydration  / hypotension  9)Metastatic tonsillar SCC: mets to lung, s/p chemoradiation previously NED in '19; lost to f/u with oncology.  - Reestablish care with oncology, Dr. Cheree Cords after discharge.    10)Urethral stricture/Urinary Retention----Requesting In-N-Out catheterization, change in and out catheterization scheduled to every 8 hours as needed -Renal ultrasound showed Distended urinary bladder with debri  - Continue intermittent catheterization q6h.   11)Diet-controlled NIDT2DM: A1 is 5.3 reflecting excellent diabetic control PTA Use Novolog /Humalog Sliding scale insulin  with Accu-Cheks/Fingersticks as ordered   12)HLD:  - Continue rosuvastatin   13)Neuropathy: Noted, may also contribute to falls.   14)Anxiety/mood disorder: --History of long-term high-dose Xanax  use, avoid abrupt discontinuation C/n xanax  at lower dosage =-Continue citalopram   15)Generalized weakness and deconditioning and disposition---  -Physical  therapy eval appreciated recommends SNF rehab --patient and daughter insist that he will not go to SNF rehab unless he can get a private room -- Apparently private room is Not available at the SNF rehab until Monday, 05/18/2023  Colin Dawley, MD [facility] [email or web address] 05/16/2023, 4:40 PM

## 2023-05-16 NOTE — Plan of Care (Signed)
  Problem: Clinical Measurements: Goal: Ability to maintain clinical measurements within normal limits will improve Outcome: Progressing Goal: Will remain free from infection Outcome: Progressing Goal: Diagnostic test results will improve Outcome: Progressing Goal: Respiratory complications will improve Outcome: Progressing   Problem: Nutrition: Goal: Adequate nutrition will be maintained Outcome: Progressing   Problem: Coping: Goal: Level of anxiety will decrease Outcome: Progressing

## 2023-05-17 DIAGNOSIS — R296 Repeated falls: Secondary | ICD-10-CM | POA: Diagnosis not present

## 2023-05-17 DIAGNOSIS — W1830XA Fall on same level, unspecified, initial encounter: Secondary | ICD-10-CM | POA: Diagnosis not present

## 2023-05-17 LAB — RENAL FUNCTION PANEL
Albumin: 2.8 g/dL — ABNORMAL LOW (ref 3.5–5.0)
Anion gap: 9 (ref 5–15)
BUN: 17 mg/dL (ref 8–23)
CO2: 32 mmol/L (ref 22–32)
Calcium: 8.8 mg/dL — ABNORMAL LOW (ref 8.9–10.3)
Chloride: 94 mmol/L — ABNORMAL LOW (ref 98–111)
Creatinine, Ser: 0.61 mg/dL (ref 0.61–1.24)
GFR, Estimated: >60 mL/min (ref >60)
Glucose, Bld: 109 mg/dL — ABNORMAL HIGH (ref 70–99)
Phosphorus: 3.1 mg/dL (ref 2.5–4.6)
Potassium: 4.1 mmol/L (ref 3.5–5.1)
Sodium: 135 mmol/L (ref 135–145)

## 2023-05-17 LAB — CBC
HCT: 38.8 % — ABNORMAL LOW (ref 39.0–52.0)
Hemoglobin: 12.6 g/dL — ABNORMAL LOW (ref 13.0–17.0)
MCH: 29.8 pg (ref 26.0–34.0)
MCHC: 32.5 g/dL (ref 30.0–36.0)
MCV: 91.7 fL (ref 80.0–100.0)
Platelets: 261 10*3/uL (ref 150–400)
RBC: 4.23 MIL/uL (ref 4.22–5.81)
RDW: 14.7 % (ref 11.5–15.5)
WBC: 6.8 10*3/uL (ref 4.0–10.5)
nRBC: 0 % (ref 0.0–0.2)

## 2023-05-17 NOTE — Progress Notes (Signed)
 [facility] PROGRESS NOTE  [name] (DOB: 12-23-1950) ZOX:[id number] PCP: Minus Amel, MD  Brief Narrative: [name] is a 73 y.o. 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 He returned to the ED 4/28 after another fall, found to have a new right lesser trochanter femur fracture. He developed lethargy due to evolving septic shock due to UTI in the ED for which antibiotics were broadened to vancomycin  and zosyn  and IV fluid resuscitation were administered. He has required peripheral levophed . VBG reassuring, patient is known DNR/DNI status and NIPPV may be contraindicated with facial fractures.   Subjective: - --- No new concerns --oxygen requirement is not worse -- Patient states I feel better -- Patient and daughter refused discharge to SNF until private room is available at [facility] facility awaiting transfer to SNF rehab,  hopefully on 05/18/2023 when private room is available  Objective: BP 108/88   Pulse 85   Temp 98.1 F (36.7 C) (Oral)   Resp (!) 23   Ht 6\' 2"  (1.88 m)   Wt 76.2 kg   SpO2 91%   BMI 21.57 kg/m    Physical Exam Gen:- Awake Alert, in no acute distress  HEENT:- [place].AT,  Hardening/induration of right side of neck consistent with hx radiation Nose- [place] 2L/min Neck-Supple Neck,No JVD,.  Lungs-improving air movement, no wheezing CV- S1, S2 normal, irregular Abd-  +ve B.Sounds, Abd Soft, No tenderness,    Extremity/Skin:- No  edema,   good pedal pulses  Psych-affect is appropriate, oriented  x3 Neuro-generalized weakness , no new focal deficits, no tremors  Assessment & Plan: 1)Severe sepsis with septic shock due to complicated ESBL Klebsiella UTI:   -Urine culture from 05/11/2023 with ESBL Klebsiella -Blood cultures from 05/11/2023 NGTD -Weaned off IV Levophed  on 05/13/2023 --Vancomycin  discontinued, as MRSA is negative -Previously received Zosyn  and Rocephin  - c/n  meropenem   (started on 05/13/2023) per sensitivities on ESBL Klebsiella, consider discharge on Levaquin   2)Recurrent Falls:  Recurrent falls---PTA pt lived at home and did very poorly, patient has significant limitations with mobility related ADLs- this patient needs to continue to be monitored in the hospital until a SNF bed is obtained as she is not safe to go home with her current physcical limitations -- Physical therapy eval appreciated recommends SNF rehab --- Patient and daughter refused discharge to SNF until private room is available at [facility] facility awaiting transfer to SNF rehab,  hopefully on 05/18/2023 when private room is available  3)New right lesser trochanter femur fracture: Recent trauma admission with orbital fracture, right calvarium fracture, right periprosthetic hip fracture. This admission underwent imaging including CT head / C spine and chest x-ray with no acute injuries, pelvic x-ray demonstrating the new right lesser trochanteric hip fracture and a healing right periprosthetic hip fracture. - Orthopedics consulted, appreciate recommendations.    4)Dysphagia with recurrent Aspiration pneumonia--has radiation to the neck and head area previously resulting in strictures and sclerosis and dysphagia -speech therapist evaluation appreciated MBBS shows lots of residue and he is aspirating thin and NTL. Aspiration is silent. Throat clear is effective  -- Episode of aspiration on 05/14/2023 now requiring  oxygen, repeat chest x-ray on 05/15/2023 consistent with aspiration pneumonia --Continue Meropenem  as above  #1   5)Acute toxic/metabolic encephalopathy on chronic cognitive impairment: Due to septic shock, mild hypercarbia without respiratory acidosis. Benzodiazepine contributed, though this is improving now. Ammonia, TSH, vitamin B12 noted to be normal. CT head nonacute - Hypercarbia beginning to improve and his mental status has improved considerably.  - Mentation back to baseline as per daughter  6)Acute hypoxic and hypercarbic respiratory failure: Due to AMS-related hypoventilation which has improved. No indication for further Tx at this time. Pt would not be candidate for NIPPV with facial fractures nor intubation based on goals of care.  - Can supplement O2 to maintain normal WOB, though would avoid over-supplementation. - As of 05/17/2023 requiring 1 to 2 L of oxygen via nasal cannula due to aspiration episode please see #4 above  7)History of facial/orbital fractures: CT head/cervical spine this admission shows no acute intracranial or cervical spine findings and stable right-sided zygomaticomaxillary complex fractures as described on last month's CTs. - Follow up with ENT as outpatient - 8)AKI----acute kidney injury   -- creatinine on admission= 1.43 ,  -baseline creatinine = 1.0  , - creatinine is now= 0.61  , --Renal ultrasound from 05/12/2023 without obstructive uropathy --- renally adjust medications, avoid nephrotoxic agents / dehydration  / hypotension  9)Metastatic tonsillar SCC: mets to lung, s/p chemoradiation previously NED in '19; lost to f/u with oncology.  - Reestablish care with oncology, Dr. Cheree Cords after discharge.    10)Urethral stricture/Urinary Retention----Requesting In-N-Out catheterization, change in and out catheterization scheduled to every 8 hours as needed -Renal ultrasound showed Distended urinary bladder with debri  - Continue intermittent catheterization q6h.   11)Diet-controlled NIDT2DM: A1 is 5.3 reflecting excellent diabetic control PTA Use  Novolog /Humalog Sliding scale insulin  with Accu-Cheks/Fingersticks as ordered   12)HLD:  - Continue rosuvastatin   13)Neuropathy: Noted, may also contribute to falls.   14)Anxiety/mood disorder: --History of long-term high-dose Xanax  use, avoid abrupt discontinuation C/n xanax  at lower dosage =-Continue citalopram   15)Generalized weakness and deconditioning and Disposition---  -Physical therapy eval appreciated recommends SNF rehab --patient and daughter insist that he will not go to SNF rehab unless he can get a private room -- Apparently private room is Not available at the SNF rehab until Monday, 05/18/2023  Colin Dawley, MD [facility] [email or web address] 05/17/2023, 3:55 PM

## 2023-05-18 DIAGNOSIS — R419 Unspecified symptoms and signs involving cognitive functions and awareness: Secondary | ICD-10-CM | POA: Diagnosis not present

## 2023-05-18 DIAGNOSIS — M6289 Other specified disorders of muscle: Secondary | ICD-10-CM | POA: Diagnosis not present

## 2023-05-18 DIAGNOSIS — W1830XA Fall on same level, unspecified, initial encounter: Secondary | ICD-10-CM | POA: Diagnosis not present

## 2023-05-18 DIAGNOSIS — N35919 Unspecified urethral stricture, male, unspecified site: Secondary | ICD-10-CM | POA: Diagnosis not present

## 2023-05-18 DIAGNOSIS — M6281 Muscle weakness (generalized): Secondary | ICD-10-CM | POA: Diagnosis not present

## 2023-05-18 DIAGNOSIS — S72121D Displaced fracture of lesser trochanter of right femur, subsequent encounter for closed fracture with routine healing: Secondary | ICD-10-CM | POA: Diagnosis not present

## 2023-05-18 DIAGNOSIS — E119 Type 2 diabetes mellitus without complications: Secondary | ICD-10-CM | POA: Diagnosis not present

## 2023-05-18 DIAGNOSIS — N179 Acute kidney failure, unspecified: Secondary | ICD-10-CM | POA: Diagnosis not present

## 2023-05-18 DIAGNOSIS — G9341 Metabolic encephalopathy: Secondary | ICD-10-CM | POA: Diagnosis not present

## 2023-05-18 DIAGNOSIS — R471 Dysarthria and anarthria: Secondary | ICD-10-CM | POA: Diagnosis not present

## 2023-05-18 DIAGNOSIS — G629 Polyneuropathy, unspecified: Secondary | ICD-10-CM | POA: Diagnosis not present

## 2023-05-18 DIAGNOSIS — N390 Urinary tract infection, site not specified: Secondary | ICD-10-CM | POA: Diagnosis not present

## 2023-05-18 DIAGNOSIS — J99 Respiratory disorders in diseases classified elsewhere: Secondary | ICD-10-CM | POA: Diagnosis not present

## 2023-05-18 DIAGNOSIS — E785 Hyperlipidemia, unspecified: Secondary | ICD-10-CM | POA: Diagnosis not present

## 2023-05-18 DIAGNOSIS — R278 Other lack of coordination: Secondary | ICD-10-CM | POA: Diagnosis not present

## 2023-05-18 DIAGNOSIS — G894 Chronic pain syndrome: Secondary | ICD-10-CM | POA: Diagnosis not present

## 2023-05-18 DIAGNOSIS — R52 Pain, unspecified: Secondary | ICD-10-CM | POA: Diagnosis not present

## 2023-05-18 DIAGNOSIS — R1312 Dysphagia, oropharyngeal phase: Secondary | ICD-10-CM | POA: Diagnosis not present

## 2023-05-18 DIAGNOSIS — D649 Anemia, unspecified: Secondary | ICD-10-CM | POA: Diagnosis not present

## 2023-05-18 DIAGNOSIS — C7800 Secondary malignant neoplasm of unspecified lung: Secondary | ICD-10-CM | POA: Diagnosis not present

## 2023-05-18 DIAGNOSIS — R296 Repeated falls: Secondary | ICD-10-CM | POA: Diagnosis not present

## 2023-05-18 DIAGNOSIS — F339 Major depressive disorder, recurrent, unspecified: Secondary | ICD-10-CM | POA: Diagnosis not present

## 2023-05-18 DIAGNOSIS — C098 Malignant neoplasm of overlapping sites of tonsil: Secondary | ICD-10-CM | POA: Diagnosis not present

## 2023-05-18 DIAGNOSIS — A419 Sepsis, unspecified organism: Secondary | ICD-10-CM | POA: Diagnosis not present

## 2023-05-18 DIAGNOSIS — R41841 Cognitive communication deficit: Secondary | ICD-10-CM | POA: Diagnosis not present

## 2023-05-18 DIAGNOSIS — F419 Anxiety disorder, unspecified: Secondary | ICD-10-CM | POA: Diagnosis not present

## 2023-05-18 DIAGNOSIS — E441 Mild protein-calorie malnutrition: Secondary | ICD-10-CM | POA: Diagnosis not present

## 2023-05-18 DIAGNOSIS — R2689 Other abnormalities of gait and mobility: Secondary | ICD-10-CM | POA: Diagnosis not present

## 2023-05-18 DIAGNOSIS — J9612 Chronic respiratory failure with hypercapnia: Secondary | ICD-10-CM | POA: Diagnosis not present

## 2023-05-18 MED ORDER — ORAL CARE MOUTH RINSE
15.0000 mL | OROMUCOSAL | Status: DC
  Administered 2023-05-18: 15 mL via OROMUCOSAL

## 2023-05-18 MED ORDER — LEVOFLOXACIN 750 MG PO TABS: 750.0000 mg | ORAL_TABLET | Freq: Every day | ORAL | 0 refills | Status: AC

## 2023-05-18 MED ORDER — ALBUTEROL SULFATE (2.5 MG/3ML) 0.083% IN NEBU: 2.5000 mg | INHALATION_SOLUTION | RESPIRATORY_TRACT | 10 refills | Status: AC | PRN

## 2023-05-18 MED ORDER — TRAMADOL HCL 50 MG PO TABS: 50.0000 mg | ORAL_TABLET | Freq: Three times a day (TID) | ORAL | 0 refills | Status: AC | PRN

## 2023-05-18 MED ORDER — ALPRAZOLAM 1 MG PO TABS: 1.0000 mg | ORAL_TABLET | Freq: Every day | ORAL | 0 refills | Status: DC

## 2023-05-18 MED ORDER — CITALOPRAM HYDROBROMIDE 40 MG PO TABS: 40.0000 mg | ORAL_TABLET | Freq: Every day | ORAL | 4 refills | Status: AC

## 2023-05-18 MED ORDER — ROPINIROLE HCL 1 MG PO TABS: 1.0000 mg | ORAL_TABLET | Freq: Every day | ORAL | 1 refills | Status: DC

## 2023-05-18 MED ORDER — ALPRAZOLAM 0.5 MG PO TABS: 0.5000 mg | ORAL_TABLET | Freq: Three times a day (TID) | ORAL | 0 refills | Status: DC

## 2023-05-18 MED ORDER — ORAL CARE MOUTH RINSE: 15.0000 mL | OROMUCOSAL | Status: DC | PRN

## 2023-05-18 NOTE — Discharge Summary (Signed)
 [name], is a 73 y.o. male  DOB [birth year]  MRN [id number].  Admission date:  05/11/2023  Admitting Physician  Arnulfo Larch, MD  Discharge Date:  05/18/2023   Primary MD  Minus Amel, MD  Recommendations for primary care physician for things to follow:  1)Avoid ibuprofen/Advil/Aleve/Motrin/Goody Powders/Naproxen/BC powders/Meloxicam/Diclofenac/Indomethacin and other Nonsteroidal anti-inflammatory medications as these will make you more likely to bleed and can cause stomach ulcers, can also cause Kidney problems.   2) You need oxygen at home at 2 L via nasal cannula continuously while awake and while asleep--- smoking or having open fires around oxygen can cause fire, significant injury and death.  3)DIET DYS 3 Fluid consistency: Thin  Diet effective now      Comments: Crush meds and administer with puree Question:  Fluid consistency:  Answer:  Thin  4)Urethral stricture/Urinary Retention----Requiring In-N-Out catheterization, change in and out catheterization - - Continue intermittent catheterization every 6-8 hours  Admission Diagnosis  Recurrent falls [R29.6] Ground-level fall [W18.30XA]   Discharge Diagnosis  Recurrent falls [R29.6] Ground-level fall [W18.30XA]    Principal Problem:   Ground-level fall Active Problems:   Urinary tract infection   Closed fracture of lesser trochanter of right femur (HCC)   Septic shock (HCC)   Acute encephalopathy   AKI (acute kidney injury) (HCC)      Past Medical History:  Diagnosis Date   Allergy    Anxiety    Arthritis    knees, HIps, Hands   Chest pain    10/14   Complication of anesthesia    bleeding during intubation 04/04/13 due to friability of right tonsillar cancer   Constipation    Depression    Fibromyalgia    Lung cancer (HCC)    Neuropathy    compression neuropathy right hip;s/p replacement   PEG (percutaneous endoscopic  gastrostomy) status (HCC)    Pneumonia 01/13/2010   S/P radiation therapy 05/02/2013-06/22/2013   70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 Ottelin   GASTROSTOMY TUBE PLACEMENT  04/04/2013   LAPAROSCOPIC GASTROSTOMY N/A 04/04/2013   Procedure: LAPAROSCOPIC GASTROSTOMY TUBE PLACEMENT ;  Surgeon: Shela Derby, MD;  Location: [facility] OR;  Service: General;  Laterality: N/A;   MULTIPLE EXTRACTIONS WITH ALVEOLOPLASTY N/A 04/14/2013   Procedure: Extraction of tooth #'s 1,2,3,4,5,6,7,8,9,10,11,12,13,14,15,17,18,19,20,21,22,23,24,25,26,27,28,29, 30, 31, and 32 with alveoloplasty and bilateral mandibular tori reductions.;  Surgeon: Carol Chroman, DDS;  Location: [facility] OR;  Service: Oral Surgery;  Laterality: N/A;   NASAL HEMORRHAGE CONTROL N/A 04/04/2013   Procedure: Control of oropharyngeal hemorrhage;  Surgeon: Lawence Press, MD;  Location: [facility] OR;  Service: ENT;  Laterality: N/A;   PORTACATH PLACEMENT Right 04/04/2013   PORTACATH PLACEMENT N/A 04/04/2013   Procedure: INSERTION PORT-A-CATH;  Surgeon: Shela Derby, MD;  Location: [facility]  OR;  Service: General;  Laterality: N/A;   TOTAL HIP ARTHROPLASTY Right 2000   Dr. Abigail Abler     HPI  from the history and physical done on the day of admission:   HPI:  [name] is a 73 y.o. 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 He returns after another ground level fall today. His speech is hard to understand, but tells me that he hit his head.  Denies any headache or facial pain.  Denies syncopal episode, chest pain, palpitations.  Denies any other sites of pain although complained of pain in the right hip on ED evaluation.  He is not sure why he keeps falling but complains of jerking movements and trouble with coordination.  Denies any recent fever, chills, cough/cold, abdominal pain, nausea, vomiting, diarrhea, dysuria or other urinary changes.   Review of Systems:  ROS complete and negative except as marked above   Hospital Course:   1)Severe sepsis with septic shock due to complicated ESBL Klebsiella UTI:   -Urine culture from 05/11/2023 with ESBL Klebsiella -Blood cultures from 05/11/2023 NGTD -Weaned off IV Levophed  on 05/13/2023 --Vancomycin  discontinued, as MRSA is negative -Previously received Zosyn  and Rocephin  - Treated with IV meropenem   (started on 05/13/2023) per sensitivities on ESBL Klebsiella, - --discharge on Levaquin  to complete treatment protocol   2)Recurrent Falls:  Recurrent falls---PTA pt lived at home and did very poorly, patient has significant limitations with mobility related ADLs- this patient needs to continue to be monitored in the hospital until a SNF bed is obtained as she is not safe to go home with her current physcical limitations -- Physical therapy eval appreciated recommends SNF rehab ---     3)New right lesser trochanter femur fracture: Recent trauma admission with orbital fracture, right calvarium fracture, right periprosthetic hip fracture. This admission underwent imaging including CT head / C spine and chest x-ray with no acute injuries, pelvic x-ray demonstrating the new right lesser trochanteric hip fracture and a healing right periprosthetic hip fracture. - Orthopedics consulted, appreciate recommendations.     4)Dysphagia with recurrent  Aspiration pneumonia--has radiation to the neck and head area previously resulting in strictures and sclerosis and dysphagia -speech therapist evaluation appreciated MBBS shows lots of residue and he is aspirating thin and NTL. Aspiration is silent. Throat clear is effective  -- Episode of aspiration on 05/14/2023 now requiring oxygen, repeat chest x-ray on 05/15/2023 consistent with aspiration pneumonia Treated with meropenem  as above #1   5)Acute toxic/metabolic encephalopathy on chronic cognitive impairment: Due to septic shock, mild hypercarbia without respiratory acidosis. Benzodiazepine contributed, though this is improving now. Ammonia, TSH, vitamin B12 noted to be normal. CT head nonacute - Hypercarbia beginning to improve and his mental status has improved considerably.  - Mentation back to baseline as per daughter   6)Acute hypoxic and hypercarbic respiratory failure: Due to AMS-related hypoventilation which has improved. No indication for further Tx at this time. Pt would not be candidate for NIPPV with facial fractures nor intubation based on goals of care.  - Can supplement O2 to maintain normal WOB, though would avoid over-supplementation. -  As of 05/18/2023 requiring 1 to 2 L of oxygen via nasal cannula due to aspiration episode please see #4 above -Much improved overall from a respiratory standpoint -Antibiotics as above in #1   7)History of facial/orbital fractures: CT head/cervical spine this admission shows no acute intracranial or cervical spine findings and stable right-sided zygomaticomaxillary complex fractures as described on last month's CTs. - Follow up with ENT as outpatient - 8)AKI----acute kidney injury   -- creatinine on admission= 1.43 ,  -baseline creatinine = 1.0  , - creatinine is now= 0.61  , --Renal ultrasound from 05/12/2023 without obstructive uropathy --- renally adjust medications, avoid nephrotoxic agents / dehydration  / hypotension   9)Metastatic  tonsillar SCC: mets to lung, s/p chemoradiation previously NED in '19; lost to f/u with oncology.  - Reestablish care with oncology, Dr. Cheree Cords after discharge.     10)Urethral stricture/Urinary Retention----Requesting In-N-Out catheterization, change in and out catheterization scheduled to every 8 hours as needed -Renal ultrasound showed Distended urinary bladder with debri  - Continue intermittent catheterization every 6-8 hours   11)Diet-controlled NIDT2DM: A1 is 5.3 reflecting excellent diabetic control PTA -Continue diet control at this time   12)HLD:  - Continue rosuvastatin    13)Neuropathy: Noted, may also contribute to falls.    14)Anxiety/mood disorder: --History of long-term high-dose Xanax  use, avoid abrupt discontinuation C/n xanax  at lower dosage =-Continue citalopram    15)Generalized weakness and deconditioning and Disposition---  -Physical therapy eval appreciated recommends SNF rehab --- Discharge to SNF rehab  Discharge Condition: stable  Follow UP   Contact information for after-discharge care     Destination     [facility] .   Service: Skilled Nursing Contact information: [address] [place]  [address] [phone]                    Diet and Activity recommendation:  As advised  Discharge Instructions   Discharge Instructions     Call MD for:  persistant dizziness or light-headedness   Complete by: As directed    Call MD for:  persistant nausea and vomiting   Complete by: As directed    Call MD for:  temperature >100.4   Complete by: As directed    Diet - low sodium heart healthy   Complete by: As directed    DIET DYS 3 Fluid consistency: Thin  Diet effective now      Comments: Crush meds and administer with puree Question:  Fluid consistency:  Answer:  Thin   Discharge instructions   Complete by: As directed    1)Avoid ibuprofen/Advil/Aleve/Motrin/Goody Powders/Naproxen/BC  powders/Meloxicam/Diclofenac/Indomethacin and other Nonsteroidal anti-inflammatory medications as these will make you more likely to bleed and can cause stomach ulcers, can also cause Kidney problems.   2) You need oxygen at home at 2 L via nasal cannula continuously while awake and while asleep--- smoking or having open fires around oxygen can cause fire, significant injury and death.  3)DIET DYS 3 Fluid consistency: Thin  Diet effective now      Comments: Crush meds and administer with puree Question:  Fluid consistency:  Answer:  Thin  4)Urethral stricture/Urinary Retention----Requiring In-N-Out catheterization, change in and out catheterization - - Continue intermittent catheterization every 6-8 hours   Discharge wound care:   Complete by: As directed    As above   For home use only DME oxygen   Complete by: As directed    SATURATION QUALIFICATIONS: (This note is used to  comply with regulatory documentation for home oxygen)   Patient Saturations on Room Air at Rest = 87%   Patient Saturations on Room Air while Ambulating = 84 %   Patient Saturations on 2 Liters of oxygen while Ambulating = 92 %    Patient needs continuous O2 at 2 L/min continuously via nasal cannula with humidifier, with gaseous portability and conserving device    You need oxygen at home at 2 L via nasal cannula continuously while awake and while asleep--- smoking or having open fires around oxygen can cause fire, significant injury and death.   Length of Need: Lifetime   Mode or (Route): Nasal cannula   Liters per Minute: 2   Frequency: Continuous (stationary and portable oxygen unit needed)   Oxygen conserving device: Yes   Oxygen delivery system: Gas   Increase activity slowly   Complete by: As directed          Discharge Medications     Allergies as of 05/18/2023       Reactions   Neurontin [gabapentin] Other (See Comments)   Causes seizures   Buspirone Other (See Comments)   Lips swelling    Dilaudid  [hydromorphone  Hcl] Other (See Comments)   Oxygen saturation drops        Medication List     TAKE these medications    acetaminophen  500 MG tablet Commonly known as: TYLENOL  Take 2 tablets (1,000 mg total) by mouth every 6 (six) hours as needed. What changed: reasons to take this   albuterol  (2.5 MG/3ML) 0.083% nebulizer solution Commonly known as: PROVENTIL  Take 3 mLs (2.5 mg total) by nebulization every 4 (four) hours as needed for wheezing or shortness of breath.   ALPRAZolam  0.5 MG tablet Commonly known as: XANAX  Take 1 tablet (0.5 mg total) by mouth 3 (three) times daily. What changed:  medication strength how much to take when to take this   ALPRAZolam  1 MG tablet Commonly known as: XANAX  Take 1 tablet (1 mg total) by mouth at bedtime. What changed: You were already taking a medication with the same name, and this prescription was added. Make sure you understand how and when to take each.   citalopram  40 MG tablet Commonly known as: CELEXA  Take 1 tablet (40 mg total) by mouth daily.   levofloxacin  750 MG tablet Commonly known as: Levaquin  Take 1 tablet (750 mg total) by mouth daily for 3 days. Start taking on: May 19, 2023   rOPINIRole  1 MG tablet Commonly known as: REQUIP  Take 1 tablet (1 mg total) by mouth at bedtime. What changed:  medication strength how much to take   rosuvastatin  10 MG tablet Commonly known as: CRESTOR  Take 10 mg by mouth at bedtime.   traMADol  50 MG tablet Commonly known as: ULTRAM  Take 1 tablet (50 mg total) by mouth every 8 (eight) hours as needed (moderate to severe pain). What changed:  how much to take when to take this reasons to take this               Durable Medical Equipment  (From admission, onward)           Start     Ordered   05/18/23 1139  For home use only DME oxygen  Once       Comments: SATURATION QUALIFICATIONS: (This note is used to comply with regulatory documentation for home  oxygen)   Patient Saturations on Room Air at Rest = 87%   Patient Saturations on [employer]  while Ambulating = 84 %   Patient Saturations on 2 Liters of oxygen while Ambulating = 92 %    Patient needs continuous O2 at 2 L/min continuously via nasal cannula with humidifier, with gaseous portability and conserving device    You need oxygen at home at 2 L via nasal cannula continuously while awake and while asleep--- smoking or having open fires around oxygen can cause fire, significant injury and death.  Question Answer Comment  Length of Need Lifetime   Mode or (Route) Nasal cannula   Liters per Minute 2   Frequency Continuous (stationary and portable oxygen unit needed)   Oxygen conserving device Yes   Oxygen delivery system Gas      05/18/23 1146   05/18/23 0000  For home use only DME oxygen       Comments: SATURATION QUALIFICATIONS: (This note is used to comply with regulatory documentation for home oxygen)   Patient Saturations on Room Air at Rest = 87%   Patient Saturations on Room Air while Ambulating = 84 %   Patient Saturations on 2 Liters of oxygen while Ambulating = 92 %    Patient needs continuous O2 at 2 L/min continuously via nasal cannula with humidifier, with gaseous portability and conserving device    You need oxygen at home at 2 L via nasal cannula continuously while awake and while asleep--- smoking or having open fires around oxygen can cause fire, significant injury and death.  Question Answer Comment  Length of Need Lifetime   Mode or (Route) Nasal cannula   Liters per Minute 2   Frequency Continuous (stationary and portable oxygen unit needed)   Oxygen conserving device Yes   Oxygen delivery system Gas      05/18/23 1146              Discharge Care Instructions  (From admission, onward)           Start     Ordered   05/18/23 0000  Discharge wound care:       Comments: As above   05/18/23 1146            Major procedures and  Radiology Reports - PLEASE review detailed and final reports for all details, in brief -   DG Chest 2 View Result Date: 05/15/2023 CLINICAL DATA:  Dyspnea. EXAM: CHEST - 2 VIEW COMPARISON:  May 12, 2023. FINDINGS: The heart size and mediastinal contours are within normal limits. Right subclavian Port-A-Cath is unchanged. Increased bibasilar opacities are noted concerning for worsening atelectasis or pneumonia, left greater than right. The visualized skeletal structures are unremarkable. IMPRESSION: Increased bilateral lung opacities as noted above. Electronically Signed   By: Rosalene Colon M.D.   On: 05/15/2023 11:25   DG Swallowing Func-Speech Pathology Result Date: 05/13/2023 Table formatting from the original result was not included. Modified Barium Swallow Study Patient Details Name: [name] MRN: [id number] Date of Birth: July 12, 1950 Today's Date: 05/13/2023 HPI/PMH: HPI: Nathanel Mubarak Stenzel is a 73 y.o. 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 He returned to the ED 4/28 after another fall, found to have  a new right lesser trochanter femur fracture. He developed lethargy due to evolving septic shock due to UTI in the ED for which antibiotics were broadened to vancomycin  and zosyn  and IV fluid resuscitation were administered. He has required peripheral levophed . VBG reassuring, patient is known DNR/DNI status and NIPPV may be contraindicated with facial fractures. BSE requested. Pt last had MBS 04/26/2015 with recommendation for D3/NTL and was  seen for outpatient dysphagia therapy. Clinical Impression: Pt presents with mild oral and moderately severe pharyngeal dysphagia. Pt demonstrated very similar presentation on MBSS completed in 2017, with slight worsening of severity on today's exam. Pt with trace to moderate amounts of silent aspiration of thin liquids. NTL was not initially silently aspirated, however majority of bolus of NTL remains in the pharynx after the initial swallow and trace to moderate amounts are then aspirated after the swallow.  Oral stage is characterized by slightly disorganized chewing, decreased tongue control and mild oral residue after the swallow. Pharyngeal stage is characterized by decreased base of tongue retraction, little to no epiglottic deflection, very diminished pharyngeal stripping wave, and decreased laryngeal vestibule closure. Impairments result in moderate to severe amounts of diffuse pharyngeal residue; multiple swallows do not result in complete clearance of pharyngeal residue. As pharyngeal residue mixes with secretions trace amounts fall to the vocal cords, remain there and are often silently aspirated. Chin tuck was not effective, however, Cued throat clear was very effective in clearing penetrates and aspirates however throat clear was not reflexively produced. Pt has been consuming a soft diet with thin liquids since 2017 when he stopped attending OP ST. Per pt's daughter's report Pt has only had PNA twice since that time. Barium tablet was administered with thin liquids (this is how Pt takes them at home), however, pill with stasis in the valleculae and despite multiple administrations of varying textures and maneuvers attempted tablet remained in the valleculae and was not visualized passing through the pharynx-- the exact scenario also described on MBSS in 2017. Discussed findings at length with [name]. Despite the slight decrease in volume and frequency of aspiration with NTL, Pt still continues  to consistently aspirate some NTL residue. Pt strongly prefers to NOT be on NTL for QOL purposes. Recommend continue with mech soft/D3 diet and thin liquids - recommend small bites/sips, followed by throat clear and multiple dry swallows after each bite/sip. Patient and daughter understand that despite least restrictive diet and strategy usage Pt remains at very high risk for aspiration. Recommend crush meds and administer with puree. Above reviewed with MD, RN and [name]. ST will continue to follow acutely Factors that may increase risk of adverse event in presence of aspiration [name] & [name] 2021): No data recorded Recommendations/Plan: Swallowing Evaluation Recommendations Swallowing Evaluation Recommendations Recommendations: PO diet PO Diet Recommendation: Dysphagia 3 (Mechanical soft); Thin liquids (Level 0) Liquid Administration via: Cup; Straw (small sips) Medication Administration: Crushed with puree Supervision: Patient able to self-feed; Full supervision/cueing for swallowing strategies Swallowing strategies  : Slow rate; Small bites/sips; Multiple dry swallows after each bite/sip; Clear throat intermittently; Avoid mixed consistencies Postural changes: Position pt fully upright for meals Oral care recommendations: Oral care BID (2x/day) Treatment Plan Treatment Plan Treatment recommendations: Therapy as outlined in treatment plan below Follow-up recommendations: Skilled nursing-short term rehab (<3 hours/day) Functional status assessment: Patient has had a recent decline in their functional status and demonstrates the ability to make significant improvements in function in a reasonable and predictable amount of time. Treatment frequency: Min  1x/week Treatment duration: 1 week Interventions: Aspiration precaution training; Compensatory techniques; Patient/family education; Trials of upgraded texture/liquids; Diet toleration management by SLP Recommendations Recommendations for follow up therapy  are one component of a multi-disciplinary discharge planning process, led by the attending physician.  Recommendations may be updated based on patient status, additional functional criteria and insurance authorization. Assessment: Orofacial Exam: Orofacial Exam Oral Cavity: Oral Hygiene: WFL Oral Cavity - Dentition: Edentulous Orofacial Anatomy: WFL Oral Motor/Sensory Function: WFL Anatomy: Anatomy: WFL Boluses Administered: Boluses Administered Boluses Administered: Thin liquids (Level 0); Mildly thick liquids (Level 2, nectar thick); Moderately thick liquids (Level 3, honey thick); Puree; Solid  Oral Impairment Domain: Oral Impairment Domain Lip Closure: Interlabial escape, no progression to anterior lip Tongue control during bolus hold: Escape to lateral buccal cavity/floor of mouth Bolus preparation/mastication: Timely and efficient chewing and mashing Bolus transport/lingual motion: Brisk tongue motion Oral residue: Trace residue lining oral structures Location of oral residue : Tongue Initiation of pharyngeal swallow : Pyriform sinuses  Pharyngeal Impairment Domain: Pharyngeal Impairment Domain Soft palate elevation: No bolus between soft palate (SP)/pharyngeal wall (PW) Laryngeal elevation: Complete superior movement of thyroid  cartilage with complete approximation of arytenoids to epiglottic petiole Anterior hyoid excursion: Partial anterior movement Epiglottic movement: Partial inversion; No inversion Laryngeal vestibule closure: Incomplete, narrow column air/contrast in laryngeal vestibule Pharyngeal stripping wave : Present - diminished Pharyngeal contraction (A/P view only): N/A Pharyngoesophageal segment opening: Partial distention/partial duration, partial obstruction of flow Tongue base retraction: Trace column of contrast or air between tongue base and PPW; Narrow column of contrast or air between tongue base and PPW Pharyngeal residue: Collection of residue within or on pharyngeal structures;  Majority of contrast within or on pharyngeal structures Location of pharyngeal residue: Tongue base; Valleculae; Pharyngeal wall; Aryepiglottic folds; Pyriform sinuses; Diffuse (>3 areas)  Esophageal Impairment Domain: Esophageal Impairment Domain Esophageal clearance upright position: Complete clearance, esophageal coating Pill: Pill Consistency administered: Thin liquids (Level 0) Thin liquids (Level 0): Impaired (see clinical impressions) Penetration/Aspiration Scale Score: Penetration/Aspiration Scale Score 1.  Material does not enter airway: Pill 8.  Material enters airway, passes BELOW cords without attempt by patient to eject out (silent aspiration) : Thin liquids (Level 0); Mildly thick liquids (Level 2, nectar thick); Puree Compensatory Strategies: Compensatory Strategies Compensatory strategies: Yes Effortful swallow: Ineffective Chin tuck: Ineffective Liquid wash: Ineffective Other(comment): Effective (throat clear)   General Information: Caregiver present: No  Diet Prior to this Study: Regular; Thin liquids (Level 0)   Temperature : Normal   Respiratory Status: WFL   Supplemental O2: None (Room air)   History of Recent Intubation: No  Behavior/Cognition: Alert; Cooperative; Pleasant mood Self-Feeding Abilities: Able to self-feed Baseline vocal quality/speech: Normal Volitional Cough: Able to elicit Volitional Swallow: Able to elicit No data recorded Goal Planning: Prognosis for improved oropharyngeal function: Fair No data recorded No data recorded Patient/Family Stated Goal: n/a Consulted and agree with results and recommendations: Patient; Family member/caregiver; Physician; Nurse Pain: Pain Assessment Pain Assessment: No/denies pain End of Session: Start Time:SLP Start Time (ACUTE ONLY): 1410 Stop Time: SLP Stop Time (ACUTE ONLY): 1459 Time Calculation:SLP Time Calculation (min) (ACUTE ONLY): 49 min Charges: SLP Evaluations $ SLP Speech Visit: 1 Visit SLP Evaluations $BSS Swallow: 1 Procedure $MBS  Swallow: 1 Procedure $Swallowing Treatment: 1 Procedure SLP visit diagnosis: SLP Visit Diagnosis: Dysphagia, unspecified (R13.10) Past Medical History: Past Medical History: Diagnosis Date  Allergy   Anxiety   Arthritis   knees, HIps, Hands  Chest pain   10/14  Complication of anesthesia   bleeding during intubation 04/04/13 due to friability of right tonsillar cancer  Constipation   Depression   Fibromyalgia   Lung cancer (HCC)   Neuropathy   compression neuropathy right hip;s/p replacement  PEG (percutaneous endoscopic gastrostomy) status (HCC)   Pneumonia 01/13/2010  S/P radiation therapy 05/02/2013-06/22/2013  70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 Ottelin  GASTROSTOMY TUBE PLACEMENT  04/04/2013  LAPAROSCOPIC GASTROSTOMY N/A 04/04/2013  Procedure: LAPAROSCOPIC GASTROSTOMY TUBE PLACEMENT ;  Surgeon: Shela Derby, MD;  Location: [facility] OR;  Service: General;  Laterality: N/A;  MULTIPLE EXTRACTIONS WITH ALVEOLOPLASTY N/A 04/14/2013  Procedure: Extraction of tooth #'s 1,2,3,4,5,6,7,8,9,10,11,12,13,14,15,17,18,19,20,21,22,23,24,25,26,27,28,29,  30, 31, and 32 with alveoloplasty and bilateral mandibular tori reductions.;  Surgeon: Carol Chroman, DDS;  Location: [facility] OR;  Service: Oral Surgery;  Laterality: N/A;  NASAL HEMORRHAGE CONTROL N/A 04/04/2013  Procedure: Control of oropharyngeal hemorrhage;  Surgeon: Lawence Press, MD;  Location: [facility] OR;  Service: ENT;  Laterality: N/A;  PORTACATH PLACEMENT Right 04/04/2013  PORTACATH PLACEMENT N/A 04/04/2013  Procedure: INSERTION PORT-A-CATH;  Surgeon: Shela Derby, MD;  Location: [facility] OR;  Service: General;  Laterality: N/A;  TOTAL HIP ARTHROPLASTY Right 2000  Dr. Aubery Blare H. Vergil Glasser, CCC-SLP Speech Language Pathologist [name] 05/13/2023, 9:55 PM  US   RENAL Result Date: 05/12/2023 CLINICAL DATA:  Acute kidney injury. History of a ureteral stricture. EXAM: RENAL / URINARY TRACT ULTRASOUND COMPLETE COMPARISON:  Renal ultrasound 01/18/2018. CT 03/25/2023. FINDINGS: Right Kidney: Renal measurements: 11.1 x 5.2 x 4.8 cm = volume: 145.2 mL. Echogenicity within normal limits. No mass or hydronephrosis visualized. Prominent column of Bertin Left Kidney: Renal measurements: 14.0 x 7.0 x 4.8 cm = volume: 243.9 mL. No perinephric fluid. Extrarenal pelvis identified. Slight distension. Small exophytic cyst measuring 13 mm as seen on prior CT. Bladder: Distended bladder with some luminal debris. Lobular contour. Bladder stones on the previous exam are suggested today. Other: None. IMPRESSION: Minimal ectasia of the left renal pelvis. The left hemipelvis is extra renal. No right-sided collecting system dilatation. Distended urinary bladder with debris. Please correlate with prior CT Electronically Signed   By: Adrianna Horde M.D.   On: 05/12/2023 19:22   DG CHEST PORT 1 VIEW Result Date: 05/12/2023 CLINICAL DATA:  [id number] with hypoxia and shortness of breath. EXAM: PORTABLE CHEST 1 VIEW COMPARISON:  AP Lat chest yesterday at 4:07 p.m. FINDINGS: 5:44 a.m. The cardiomediastinal silhouette is normal. Central vascular prominence continues to be seen. There is new patchy airspace disease in the left lower lung field most likely in the lower lobe as there is preservation of the left heart silhouette. Findings consistent with pneumonia aspiration. Remaining lungs are clear with mild elevation of the right hemidiaphragm. Right subclavian port catheter again terminates at the superior cavoatrial junction. No new osseous abnormality is seen.  Thoracic spondylosis. IMPRESSION: 1. New patchy airspace disease in the left lower lung field most likely in the lower lobe, consistent with pneumonia or aspiration. 2. Central vascular prominence again seen.  Normal cardiac size. Electronically  Signed   By: Denman Fischer M.D.   On: 05/12/2023 06:03   CT Head Wo Contrast Result Date: 05/11/2023 CLINICAL DATA:  Head trauma, minor (Age >= 65y); Neck trauma (Age >= 65y). EXAM: CT HEAD  WITHOUT CONTRAST CT CERVICAL SPINE WITHOUT CONTRAST TECHNIQUE: Multidetector CT imaging of the head and cervical spine was performed following the standard protocol without intravenous contrast. Multiplanar CT image reconstructions of the cervical spine were also generated. RADIATION DOSE REDUCTION: This exam was performed according to the departmental dose-optimization program which includes automated exposure control, adjustment of the mA and/or kV according to patient size and/or use of iterative reconstruction technique. COMPARISON:  CT head, maxillofacial, and cervical spine 03/24/2023. FINDINGS: CT HEAD FINDINGS Brain: There is no evidence of an acute infarct, intracranial hemorrhage, mass, midline shift, or extra-axial fluid collection. Mild cerebral atrophy is within normal limits for age. Hypodensities in the cerebral white matter are unchanged and nonspecific but compatible with mild-to-moderate chronic small vessel ischemic disease. Vascular: Calcified atherosclerosis at the skull base. No hyperdense vessel. Skull: Fractures of the right orbit, maxillary sinus, and zygomatic arch as described on last month's studies. No new fracture. Sinuses/Orbits: Resolved right orbital emphysema and proptosis. Improved aeration of the right maxillary sinus. Clear mastoid air cells. Other: None. CT CERVICAL SPINE FINDINGS Alignment: No acute traumatic malalignment. Skull base and vertebrae: No acute fracture or suspicious lesion. Soft tissues and spinal canal: No prevertebral fluid or swelling. No visible canal hematoma. Disc levels: C6-7 vertebral body ankylosis. Advanced disc degeneration at C5-6. Advanced facet arthrosis bilaterally at C3-4 and on the left at C4-5. Upper chest: Partially visualized biapical pleuroparenchymal  lung scarring including a nodular component on the left, present since at least 2019. Other: None. IMPRESSION: 1. No evidence of acute intracranial abnormality or acute cervical spine fracture. 2. Right-sided zygomaticomaxillary complex fractures as described on last month's CTs. Electronically Signed   By: Aundra Lee M.D.   On: 05/11/2023 20:49   CT Cervical Spine Wo Contrast Result Date: 05/11/2023 CLINICAL DATA:  Head trauma, minor (Age >= 65y); Neck trauma (Age >= 65y). EXAM: CT HEAD WITHOUT CONTRAST CT CERVICAL SPINE WITHOUT CONTRAST TECHNIQUE: Multidetector CT imaging of the head and cervical spine was performed following the standard protocol without intravenous contrast. Multiplanar CT image reconstructions of the cervical spine were also generated. RADIATION DOSE REDUCTION: This exam was performed according to the departmental dose-optimization program which includes automated exposure control, adjustment of the mA and/or kV according to patient size and/or use of iterative reconstruction technique. COMPARISON:  CT head, maxillofacial, and cervical spine 03/24/2023. FINDINGS: CT HEAD FINDINGS Brain: There is no evidence of an acute infarct, intracranial hemorrhage, mass, midline shift, or extra-axial fluid collection. Mild cerebral atrophy is within normal limits for age. Hypodensities in the cerebral white matter are unchanged and nonspecific but compatible with mild-to-moderate chronic small vessel ischemic disease. Vascular: Calcified atherosclerosis at the skull base. No hyperdense vessel. Skull: Fractures of the right orbit, maxillary sinus, and zygomatic arch as described on last month's studies. No new fracture. Sinuses/Orbits: Resolved right orbital emphysema and proptosis. Improved aeration of the right maxillary sinus. Clear mastoid air cells. Other: None. CT CERVICAL SPINE FINDINGS Alignment: No acute traumatic malalignment. Skull base and vertebrae: No acute fracture or suspicious lesion.  Soft tissues and spinal canal: No prevertebral fluid or swelling. No visible canal hematoma. Disc levels: C6-7 vertebral body ankylosis. Advanced disc degeneration at C5-6. Advanced facet arthrosis bilaterally at C3-4 and on the left at C4-5. Upper chest: Partially visualized biapical pleuroparenchymal lung scarring including a nodular component on the left, present since at least 2019. Other: None. IMPRESSION: 1. No evidence of acute intracranial abnormality or acute cervical spine fracture. 2. Right-sided zygomaticomaxillary complex  fractures as described on last month's CTs. Electronically Signed   By: Aundra Lee M.D.   On: 05/11/2023 20:49   DG Pelvis 1-2 Views Result Date: 05/11/2023 CLINICAL DATA:  [name], right hip pain, recent periprosthetic right hip fracture EXAM: PELVIS - 1-2 VIEW COMPARISON:  03/24/2023, 03/25/2023 FINDINGS: Frontal view of the pelvis includes both hips. Right hip arthroplasty is identified, with distal margin of the femoral component excluded by collimation. Periosteal reaction is seen along the site of prior periprosthetic fracture in the lateral proximal right femur. Since the previous exam, a nondisplaced fracture is identified at the lesser trochanter, with no callus formation, which may be related to acute trauma. No other acute bony abnormalities. Stable left hip osteoarthritis. Multiple bladder calculi are again noted. IMPRESSION: 1. Interval nondisplaced fracture through the lesser trochanter of the proximal right femur, with no callus formation, likely related to acute trauma. 2. Healing prior periprosthetic fracture through the lateral aspect of the proximal right femur, with moderate callus formation since prior study. Electronically Signed   By: Bobbye Burrow M.D.   On: 05/11/2023 17:01   DG Chest 2 View Result Date: 05/11/2023 CLINICAL DATA:  [name], right hip pain, hypoxia EXAM: CHEST - 2 VIEW COMPARISON:  03/24/2023, 03/25/2023 FINDINGS: Frontal and lateral views of  the chest demonstrates stable right chest wall port. The cardiac silhouette is unremarkable. Chronic pulmonary vascular congestion without acute airspace disease, effusion, or pneumothorax. No acute bony abnormalities. IMPRESSION: 1. No acute intrathoracic process. Electronically Signed   By: Bobbye Burrow M.D.   On: 05/11/2023 16:59    Micro Results   Recent Results (from the past 240 hours)  Resp panel by RT-PCR (RSV, Flu A&B, Covid) Anterior Nasal Swab     Status: None   Collection Time: 05/11/23  3:45 PM   Specimen: Anterior Nasal Swab  Result Value Ref Range Status   SARS Coronavirus 2 by RT PCR NEGATIVE NEGATIVE Final    Comment: (NOTE) SARS-CoV-2 target nucleic acids are NOT DETECTED.  The SARS-CoV-2 RNA is generally detectable in upper respiratory specimens during the acute phase of infection. The lowest concentration of SARS-CoV-2 viral copies this assay can detect is 138 copies/mL. A negative result does not preclude SARS-Cov-2 infection and should not be used as the sole basis for treatment or other patient management decisions. A negative result may occur with  improper specimen collection/handling, submission of specimen other than nasopharyngeal swab, presence of viral mutation(s) within the areas targeted by this assay, and inadequate number of viral copies(<138 copies/mL). A negative result must be combined with clinical observations, patient history, and epidemiological information. The expected result is Negative.  Fact Sheet for Patients:  BloggerCourse.com  Fact Sheet for Healthcare Providers:  SeriousBroker.it  This test is no t yet approved or cleared by the [place]  FDA and  has been authorized for detection and/or diagnosis of SARS-CoV-2 by FDA under an Emergency Use Authorization (EUA). This EUA will remain  in effect (meaning this test can be used) for the duration of the COVID-19 declaration under  Section 564(b)(1) of the Act, 21 U.S.C.section 360bbb-3(b)(1), unless the authorization is terminated  or revoked sooner.       Influenza A by PCR NEGATIVE NEGATIVE Final   Influenza B by PCR NEGATIVE NEGATIVE Final    Comment: (NOTE) The Xpert Xpress SARS-CoV-2/FLU/RSV plus assay is intended as an aid in the diagnosis of influenza from Nasopharyngeal swab specimens and should not be used as a sole basis for treatment.  Nasal washings and aspirates are unacceptable for Xpert Xpress SARS-CoV-2/FLU/RSV testing.  Fact Sheet for Patients: BloggerCourse.com  Fact Sheet for Healthcare Providers: SeriousBroker.it  This test is not yet approved or cleared by the [place]  FDA and has been authorized for detection and/or diagnosis of SARS-CoV-2 by FDA under an Emergency Use Authorization (EUA). This EUA will remain in effect (meaning this test can be used) for the duration of the COVID-19 declaration under Section 564(b)(1) of the Act, 21 U.S.C. section 360bbb-3(b)(1), unless the authorization is terminated or revoked.     Resp Syncytial Virus by PCR NEGATIVE NEGATIVE Final    Comment: (NOTE) Fact Sheet for Patients: BloggerCourse.com  Fact Sheet for Healthcare Providers: SeriousBroker.it  This test is not yet approved or cleared by the [place]  FDA and has been authorized for detection and/or diagnosis of SARS-CoV-2 by FDA under an Emergency Use Authorization (EUA). This EUA will remain in effect (meaning this test can be used) for the duration of the COVID-19 declaration under Section 564(b)(1) of the Act, 21 U.S.C. section 360bbb-3(b)(1), unless the authorization is terminated or revoked.  Performed at [facility], [address], [place], [place] [address]   Urine Culture     Status: Abnormal   Collection Time: 05/11/23  5:06 PM   Specimen: Urine, Random  Result  Value Ref Range Status   Specimen Description   Final    URINE, RANDOM Performed at [facility], [address], [place], [place] [address]    Special Requests   Final    NONE Reflexed from [id number] Performed at [facility], [address], [place], [place] [address]    Culture (A)  Final    >=100,000 COLONIES/mL KLEBSIELLA PNEUMONIAE Confirmed Extended Spectrum Beta-Lactamase Producer (ESBL).  In bloodstream infections from ESBL organisms, carbapenems are preferred over piperacillin /tazobactam. They are shown to have a lower risk of mortality.    Report Status 05/13/2023 FINAL  Final   Organism ID, Bacteria KLEBSIELLA PNEUMONIAE (A)  Final      Susceptibility   Klebsiella pneumoniae - MIC*    AMPICILLIN >=32 RESISTANT Resistant     CEFAZOLIN  >=64 RESISTANT Resistant     CEFEPIME >=32 RESISTANT Resistant     CEFTRIAXONE  >=64 RESISTANT Resistant     CIPROFLOXACIN <=0.25 SENSITIVE Sensitive     GENTAMICIN <=1 SENSITIVE Sensitive     IMIPENEM <=0.25 SENSITIVE Sensitive     NITROFURANTOIN 128 RESISTANT Resistant     TRIMETH/SULFA <=20 SENSITIVE Sensitive     AMPICILLIN/SULBACTAM >=32 RESISTANT Resistant     PIP/TAZO <=4 SENSITIVE Sensitive ug/mL    * >=100,000 COLONIES/mL KLEBSIELLA PNEUMONIAE  Culture, blood (Routine x 2)     Status: None   Collection Time: 05/11/23  6:50 PM   Specimen: BLOOD LEFT ARM  Result Value Ref Range Status   Specimen Description BLOOD LEFT ARM BOTTLES DRAWN AEROBIC AND ANAEROBIC  Final   Special Requests Blood Culture adequate volume  Final   Culture   Final    NO GROWTH 5 DAYS Performed at [facility], [address], [place], [place] [address]    Report Status 05/16/2023 FINAL  Final  Culture, blood (Routine x 2)     Status: None   Collection Time: 05/11/23  6:50 PM   Specimen: BLOOD RIGHT ARM  Result Value Ref Range Status   Specimen Description   Final    BLOOD RIGHT ARM BOTTLES DRAWN AEROBIC AND ANAEROBIC   Special Requests Blood Culture  adequate volume  Final  Culture   Final    NO GROWTH 5 DAYS Performed at [facility], [address], [place], [place] [address]    Report Status 05/16/2023 FINAL  Final  MRSA Next Gen by PCR, Nasal     Status: None   Collection Time: 05/12/23  8:58 AM   Specimen: Nasal Mucosa; Nasal Swab  Result Value Ref Range Status   MRSA by PCR Next Gen NOT DETECTED NOT DETECTED Final    Comment: (NOTE) The GeneXpert MRSA Assay (FDA approved for NASAL specimens only), is one component of a comprehensive MRSA colonization surveillance program. It is not intended to diagnose MRSA infection nor to guide or monitor treatment for MRSA infections. Test performance is not FDA approved in patients less than 51 years old. Performed at [facility], [address] [place], [place] [address]     Today   Subjective    Torrin Matar today has no new complaints  No fever  Or chills   No Nausea, Vomiting or Diarrhea -- Respiratory status overall improved still requiring 1 to 2 L of oxygen via nasal cannula          Patient has been seen and examined prior to discharge   Objective   Blood pressure 92/63, pulse 72, temperature 98.1 F (36.7 C), temperature source Oral, resp. rate 15, height 6\' 2"  (1.88 m), weight 76.2 kg, SpO2 95%.   Intake/Output Summary (Last 24 hours) at 05/18/2023 1207 Last data filed at 05/18/2023 0000 Gross per 24 hour  Intake 248.32 ml  Output 1300 ml  Net -1051.68 ml    Exam Gen:- Awake Alert, in no acute distress  HEENT:- [place].AT,  Hardening/induration of right side of neck consistent with hx radiation Nose- [place] 2L/min Neck-Supple Neck,No JVD,.  Lungs-improved air movement, no wheezing CV- S1, S2 normal, irregular Abd-  +ve B.Sounds, Abd Soft, No tenderness,    Extremity/Skin:- No  edema,   good pedal pulses  Psych-affect is appropriate, oriented x3 Neuro-generalized weakness , no new focal deficits, no tremors   Data Review   CBC w Diff:  Lab Results  Component  Value Date   WBC 6.8 05/17/2023   HGB 12.6 (L) 05/17/2023   HGB 13.9 12/16/2013   HCT 38.8 (L) 05/17/2023   HCT 42.4 12/16/2013   PLT 261 05/17/2023   PLT 167 12/16/2013   LYMPHOPCT 9 05/11/2023   LYMPHOPCT 11.3 (L) 12/16/2013   MONOPCT 8 05/11/2023   MONOPCT 9.9 12/16/2013   EOSPCT 2 05/11/2023   EOSPCT 3.5 12/16/2013   BASOPCT 1 05/11/2023   BASOPCT 0.9 12/16/2013   CMP:  Lab Results  Component Value Date   NA 135 05/17/2023   NA 142 12/16/2013   K 4.1 05/17/2023   K 4.7 12/16/2013   CL 94 (L) 05/17/2023   CO2 32 05/17/2023   CO2 32 (H) 12/16/2013   BUN 17 05/17/2023   BUN 16.6 12/16/2013   CREATININE 0.61 05/17/2023   CREATININE 0.8 12/16/2013   PROT 7.2 05/11/2023   PROT 8.0 12/16/2013   ALBUMIN 2.8 (L) 05/17/2023   ALBUMIN 4.0 12/16/2013   BILITOT 0.7 05/11/2023   BILITOT 0.45 12/16/2013   ALKPHOS 105 05/11/2023   ALKPHOS 84 12/16/2013   AST 20 05/11/2023   AST 20 12/16/2013   ALT 12 05/11/2023   ALT 13 12/16/2013   Total Discharge time is about 33 minutes  Colin Dawley M.D on 05/18/2023 at 12:07 PM  Go to [email or web address] -  for contact info  [facility] - Office  [phone]

## 2023-05-18 NOTE — TOC Transition Note (Signed)
 Transition of Care [facility]) - Discharge Note   Patient Details  Name: [name] MRN: [id number] Date of Birth: 1950/09/11  Transition of [facility] CM/SW Contact:  Grandville Lax, LCSWA Phone Number: 05/18/2023, 1:21 PM   Clinical Narrative:    CSW updated that pt is medically stable for D/C to [facility]. Pt will have a private room. CSW spoke to [place] who states they are ready for pt to admit today. Room and report numbers provided to RN. D/C clinicals sent to facility via HUB. CSW spoke with pts daughter to provide update on plan for D/C today, she is agreeable. Med necessity printed to floor for RN. EMS called for transport. TOC signing off.   Final next level of care: Skilled Nursing Facility Barriers to Discharge: Barriers Resolved   Patient Goals and CMS Choice Patient states their goals for this hospitalization and ongoing recovery are:: go to SNF CMS Medicare.gov Compare Post Acute Care list provided to:: Patient Represenative (must comment) Choice offered to / list presented to : Adult Children [place] ownership interest in [facility].provided to:: Adult Children    Discharge Placement                Patient to be transferred to facility by: EMS Name of family member notified: Daughter Patient and family notified of of transfer: 05/18/23  Discharge Plan and Services Additional resources added to the After Visit Summary for   In-house Referral: Clinical Social Work   Post Acute Care Choice: Skilled Nursing Facility                               Social Drivers of Health (SDOH) Interventions SDOH Screenings   Food Insecurity: No Food Insecurity (05/12/2023)  Housing: High Risk (05/12/2023)  Transportation Needs: No Transportation Needs (05/12/2023)  Utilities: Not At Risk (05/12/2023)  Social Connections: Socially Isolated (05/12/2023)  Tobacco Use: Low Risk  (05/11/2023)     Readmission Risk Interventions    05/18/2023    1:21 PM   Readmission Risk Prevention Plan  Transportation Screening Complete  HRI or Home Care Consult Complete  Social Work Consult for Recovery Care Planning/Counseling Complete  Palliative Care Screening Not Applicable  Medication Review Oceanographer) Complete

## 2023-05-18 NOTE — Discharge Instructions (Addendum)
 1)Avoid ibuprofen/Advil/Aleve/Motrin/Goody Powders/Naproxen/BC powders/Meloxicam/Diclofenac/Indomethacin and other Nonsteroidal anti-inflammatory medications as these will make you more likely to bleed and can cause stomach ulcers, can also cause Kidney problems.   2) You need oxygen at home at 2 L via nasal cannula continuously while awake and while asleep--- smoking or having open fires around oxygen can cause fire, significant injury and death.  3)DIET DYS 3 Fluid consistency: Thin  Diet effective now      Comments: Crush meds and administer with puree Question:  Fluid consistency:  Answer:  Thin  4)Urethral stricture/Urinary Retention----Requiring In-N-Out catheterization, change in and out catheterization - - Continue intermittent catheterization every 6-8 hours

## 2023-05-18 NOTE — Care Management Important Message (Signed)
 Important Message  Patient Details  Name: [name] MRN: [id number] Date of Birth: 1950/03/25   Important Message Given:  Yes - Medicare IM (spoke with daughter [name] at [phone] to review letter)     Neila Bally 05/18/2023, 11:19 AM

## 2023-05-18 NOTE — Progress Notes (Signed)
 SATURATION QUALIFICATIONS: (This note is used to comply with regulatory documentation for home oxygen)   Patient Saturations on Room Air at Rest = 87%   Patient Saturations on Room Air while Ambulating = 84 %   Patient Saturations on 2 Liters of oxygen while Ambulating = 92 %    Patient needs continuous O2 at 2 L/min continuously via nasal cannula with humidifier, with gaseous portability and conserving device    You need oxygen at home at 2 L via nasal cannula continuously while awake and while asleep--- smoking or having open fires around oxygen can cause fire, significant injury and death.  Colin Dawley, MD

## 2023-05-20 DIAGNOSIS — E785 Hyperlipidemia, unspecified: Secondary | ICD-10-CM | POA: Diagnosis not present

## 2023-05-20 DIAGNOSIS — S72121D Displaced fracture of lesser trochanter of right femur, subsequent encounter for closed fracture with routine healing: Secondary | ICD-10-CM | POA: Diagnosis not present

## 2023-05-20 DIAGNOSIS — N179 Acute kidney failure, unspecified: Secondary | ICD-10-CM | POA: Diagnosis not present

## 2023-05-20 DIAGNOSIS — J9612 Chronic respiratory failure with hypercapnia: Secondary | ICD-10-CM | POA: Diagnosis not present

## 2023-05-25 DIAGNOSIS — S72121D Displaced fracture of lesser trochanter of right femur, subsequent encounter for closed fracture with routine healing: Secondary | ICD-10-CM | POA: Diagnosis not present

## 2023-05-25 DIAGNOSIS — R52 Pain, unspecified: Secondary | ICD-10-CM | POA: Diagnosis not present

## 2023-05-27 DIAGNOSIS — E119 Type 2 diabetes mellitus without complications: Secondary | ICD-10-CM | POA: Diagnosis not present

## 2023-05-27 DIAGNOSIS — F419 Anxiety disorder, unspecified: Secondary | ICD-10-CM | POA: Diagnosis not present

## 2023-05-27 DIAGNOSIS — G9341 Metabolic encephalopathy: Secondary | ICD-10-CM | POA: Diagnosis not present

## 2023-05-27 DIAGNOSIS — D649 Anemia, unspecified: Secondary | ICD-10-CM | POA: Diagnosis not present

## 2023-05-27 DIAGNOSIS — E785 Hyperlipidemia, unspecified: Secondary | ICD-10-CM | POA: Diagnosis not present

## 2023-05-27 DIAGNOSIS — F339 Major depressive disorder, recurrent, unspecified: Secondary | ICD-10-CM | POA: Diagnosis not present

## 2023-05-28 DIAGNOSIS — E119 Type 2 diabetes mellitus without complications: Secondary | ICD-10-CM | POA: Diagnosis not present

## 2023-05-28 DIAGNOSIS — D649 Anemia, unspecified: Secondary | ICD-10-CM | POA: Diagnosis not present

## 2023-05-28 DIAGNOSIS — E785 Hyperlipidemia, unspecified: Secondary | ICD-10-CM | POA: Diagnosis not present

## 2023-05-28 DIAGNOSIS — G9341 Metabolic encephalopathy: Secondary | ICD-10-CM | POA: Diagnosis not present

## 2023-06-01 DIAGNOSIS — J9612 Chronic respiratory failure with hypercapnia: Secondary | ICD-10-CM | POA: Diagnosis not present

## 2023-06-01 DIAGNOSIS — J99 Respiratory disorders in diseases classified elsewhere: Secondary | ICD-10-CM | POA: Diagnosis not present

## 2023-06-01 DIAGNOSIS — C7800 Secondary malignant neoplasm of unspecified lung: Secondary | ICD-10-CM | POA: Diagnosis not present

## 2023-06-01 DIAGNOSIS — C098 Malignant neoplasm of overlapping sites of tonsil: Secondary | ICD-10-CM | POA: Diagnosis not present

## 2023-06-03 DIAGNOSIS — S72121D Displaced fracture of lesser trochanter of right femur, subsequent encounter for closed fracture with routine healing: Secondary | ICD-10-CM | POA: Diagnosis not present

## 2023-06-03 DIAGNOSIS — N390 Urinary tract infection, site not specified: Secondary | ICD-10-CM | POA: Diagnosis not present

## 2023-06-03 DIAGNOSIS — R296 Repeated falls: Secondary | ICD-10-CM | POA: Diagnosis not present

## 2023-06-03 DIAGNOSIS — M9701XD Periprosthetic fracture around internal prosthetic right hip joint, subsequent encounter: Secondary | ICD-10-CM | POA: Diagnosis not present

## 2023-06-03 DIAGNOSIS — M150 Primary generalized (osteo)arthritis: Secondary | ICD-10-CM | POA: Diagnosis not present

## 2023-06-03 DIAGNOSIS — G928 Other toxic encephalopathy: Secondary | ICD-10-CM | POA: Diagnosis not present

## 2023-06-03 DIAGNOSIS — E441 Mild protein-calorie malnutrition: Secondary | ICD-10-CM | POA: Diagnosis not present

## 2023-06-03 DIAGNOSIS — F419 Anxiety disorder, unspecified: Secondary | ICD-10-CM | POA: Diagnosis not present

## 2023-06-03 DIAGNOSIS — J9622 Acute and chronic respiratory failure with hypercapnia: Secondary | ICD-10-CM | POA: Diagnosis not present

## 2023-06-03 DIAGNOSIS — M797 Fibromyalgia: Secondary | ICD-10-CM | POA: Diagnosis not present

## 2023-06-03 DIAGNOSIS — N179 Acute kidney failure, unspecified: Secondary | ICD-10-CM | POA: Diagnosis not present

## 2023-06-03 DIAGNOSIS — Z85118 Personal history of other malignant neoplasm of bronchus and lung: Secondary | ICD-10-CM | POA: Diagnosis not present

## 2023-06-03 DIAGNOSIS — Z9981 Dependence on supplemental oxygen: Secondary | ICD-10-CM | POA: Diagnosis not present

## 2023-06-03 DIAGNOSIS — K5900 Constipation, unspecified: Secondary | ICD-10-CM | POA: Diagnosis not present

## 2023-06-03 DIAGNOSIS — A4159 Other Gram-negative sepsis: Secondary | ICD-10-CM | POA: Diagnosis not present

## 2023-06-03 DIAGNOSIS — E1142 Type 2 diabetes mellitus with diabetic polyneuropathy: Secondary | ICD-10-CM | POA: Diagnosis not present

## 2023-06-03 DIAGNOSIS — Z556 Problems related to health literacy: Secondary | ICD-10-CM | POA: Diagnosis not present

## 2023-06-03 DIAGNOSIS — Z85818 Personal history of malignant neoplasm of other sites of lip, oral cavity, and pharynx: Secondary | ICD-10-CM | POA: Diagnosis not present

## 2023-06-03 DIAGNOSIS — F329 Major depressive disorder, single episode, unspecified: Secondary | ICD-10-CM | POA: Diagnosis not present

## 2023-06-03 DIAGNOSIS — Z96641 Presence of right artificial hip joint: Secondary | ICD-10-CM | POA: Diagnosis not present

## 2023-06-03 DIAGNOSIS — G3184 Mild cognitive impairment, so stated: Secondary | ICD-10-CM | POA: Diagnosis not present

## 2023-06-03 DIAGNOSIS — Z923 Personal history of irradiation: Secondary | ICD-10-CM | POA: Diagnosis not present

## 2023-06-03 DIAGNOSIS — E785 Hyperlipidemia, unspecified: Secondary | ICD-10-CM | POA: Diagnosis not present

## 2023-06-03 DIAGNOSIS — R6521 Severe sepsis with septic shock: Secondary | ICD-10-CM | POA: Diagnosis not present

## 2023-06-04 DIAGNOSIS — Z923 Personal history of irradiation: Secondary | ICD-10-CM | POA: Diagnosis not present

## 2023-06-04 DIAGNOSIS — R296 Repeated falls: Secondary | ICD-10-CM | POA: Diagnosis not present

## 2023-06-04 DIAGNOSIS — Z9981 Dependence on supplemental oxygen: Secondary | ICD-10-CM | POA: Diagnosis not present

## 2023-06-04 DIAGNOSIS — E1142 Type 2 diabetes mellitus with diabetic polyneuropathy: Secondary | ICD-10-CM | POA: Diagnosis not present

## 2023-06-04 DIAGNOSIS — Z85818 Personal history of malignant neoplasm of other sites of lip, oral cavity, and pharynx: Secondary | ICD-10-CM | POA: Diagnosis not present

## 2023-06-04 DIAGNOSIS — R6521 Severe sepsis with septic shock: Secondary | ICD-10-CM | POA: Diagnosis not present

## 2023-06-04 DIAGNOSIS — M797 Fibromyalgia: Secondary | ICD-10-CM | POA: Diagnosis not present

## 2023-06-04 DIAGNOSIS — F419 Anxiety disorder, unspecified: Secondary | ICD-10-CM | POA: Diagnosis not present

## 2023-06-04 DIAGNOSIS — N179 Acute kidney failure, unspecified: Secondary | ICD-10-CM | POA: Diagnosis not present

## 2023-06-04 DIAGNOSIS — Z85118 Personal history of other malignant neoplasm of bronchus and lung: Secondary | ICD-10-CM | POA: Diagnosis not present

## 2023-06-04 DIAGNOSIS — M9701XD Periprosthetic fracture around internal prosthetic right hip joint, subsequent encounter: Secondary | ICD-10-CM | POA: Diagnosis not present

## 2023-06-04 DIAGNOSIS — Z96641 Presence of right artificial hip joint: Secondary | ICD-10-CM | POA: Diagnosis not present

## 2023-06-04 DIAGNOSIS — S72121D Displaced fracture of lesser trochanter of right femur, subsequent encounter for closed fracture with routine healing: Secondary | ICD-10-CM | POA: Diagnosis not present

## 2023-06-04 DIAGNOSIS — Z556 Problems related to health literacy: Secondary | ICD-10-CM | POA: Diagnosis not present

## 2023-06-04 DIAGNOSIS — M150 Primary generalized (osteo)arthritis: Secondary | ICD-10-CM | POA: Diagnosis not present

## 2023-06-04 DIAGNOSIS — N390 Urinary tract infection, site not specified: Secondary | ICD-10-CM | POA: Diagnosis not present

## 2023-06-04 DIAGNOSIS — G3184 Mild cognitive impairment, so stated: Secondary | ICD-10-CM | POA: Diagnosis not present

## 2023-06-04 DIAGNOSIS — K5900 Constipation, unspecified: Secondary | ICD-10-CM | POA: Diagnosis not present

## 2023-06-04 DIAGNOSIS — F329 Major depressive disorder, single episode, unspecified: Secondary | ICD-10-CM | POA: Diagnosis not present

## 2023-06-04 DIAGNOSIS — G928 Other toxic encephalopathy: Secondary | ICD-10-CM | POA: Diagnosis not present

## 2023-06-04 DIAGNOSIS — J9622 Acute and chronic respiratory failure with hypercapnia: Secondary | ICD-10-CM | POA: Diagnosis not present

## 2023-06-04 DIAGNOSIS — A4159 Other Gram-negative sepsis: Secondary | ICD-10-CM | POA: Diagnosis not present

## 2023-06-04 DIAGNOSIS — E785 Hyperlipidemia, unspecified: Secondary | ICD-10-CM | POA: Diagnosis not present

## 2023-06-04 DIAGNOSIS — E441 Mild protein-calorie malnutrition: Secondary | ICD-10-CM | POA: Diagnosis not present

## 2023-06-09 ENCOUNTER — Emergency Department (HOSPITAL_COMMUNITY)

## 2023-06-09 ENCOUNTER — Other Ambulatory Visit: Payer: Self-pay

## 2023-06-09 ENCOUNTER — Encounter (HOSPITAL_COMMUNITY): Payer: Self-pay

## 2023-06-09 ENCOUNTER — Emergency Department (HOSPITAL_COMMUNITY): Admission: EM | Admit: 2023-06-09 | Discharge: 2023-06-09 | Disposition: A | Discharge status: Home / Self Care

## 2023-06-09 DIAGNOSIS — R0902 Hypoxemia: Secondary | ICD-10-CM | POA: Diagnosis not present

## 2023-06-09 DIAGNOSIS — Z85818 Personal history of malignant neoplasm of other sites of lip, oral cavity, and pharynx: Secondary | ICD-10-CM | POA: Insufficient documentation

## 2023-06-09 DIAGNOSIS — R42 Dizziness and giddiness: Secondary | ICD-10-CM | POA: Diagnosis not present

## 2023-06-09 DIAGNOSIS — E119 Type 2 diabetes mellitus without complications: Secondary | ICD-10-CM | POA: Insufficient documentation

## 2023-06-09 DIAGNOSIS — R031 Nonspecific low blood-pressure reading: Secondary | ICD-10-CM | POA: Diagnosis not present

## 2023-06-09 DIAGNOSIS — G894 Chronic pain syndrome: Secondary | ICD-10-CM | POA: Diagnosis not present

## 2023-06-09 DIAGNOSIS — R059 Cough, unspecified: Secondary | ICD-10-CM | POA: Diagnosis not present

## 2023-06-09 DIAGNOSIS — Z6820 Body mass index (BMI) 20.0-20.9, adult: Secondary | ICD-10-CM | POA: Diagnosis not present

## 2023-06-09 DIAGNOSIS — N390 Urinary tract infection, site not specified: Secondary | ICD-10-CM | POA: Insufficient documentation

## 2023-06-09 DIAGNOSIS — I950 Idiopathic hypotension: Secondary | ICD-10-CM | POA: Diagnosis not present

## 2023-06-09 LAB — COMPREHENSIVE METABOLIC PANEL WITH GFR
ALT: 17 U/L (ref 0–44)
AST: 24 U/L (ref 15–41)
Albumin: 3.4 g/dL — ABNORMAL LOW (ref 3.5–5.0)
Alkaline Phosphatase: 83 U/L (ref 38–126)
Anion gap: 8 (ref 5–15)
BUN: 25 mg/dL — ABNORMAL HIGH (ref 8–23)
CO2: 31 mmol/L (ref 22–32)
Calcium: 9.8 mg/dL (ref 8.9–10.3)
Chloride: 102 mmol/L (ref 98–111)
Creatinine, Ser: 0.87 mg/dL (ref 0.61–1.24)
GFR, Estimated: >60 mL/min (ref >60)
Glucose, Bld: 100 mg/dL — ABNORMAL HIGH (ref 70–99)
Potassium: 4.9 mmol/L (ref 3.5–5.1)
Sodium: 141 mmol/L (ref 135–145)
Total Bilirubin: 0.5 mg/dL (ref 0.0–1.2)
Total Protein: 7.1 g/dL (ref 6.5–8.1)

## 2023-06-09 LAB — CBC WITH DIFFERENTIAL/PLATELET
Abs Immature Granulocytes: 0.02 10*3/uL (ref 0.00–0.07)
Basophils Absolute: 0.1 10*3/uL (ref 0.0–0.1)
Basophils Relative: 1 %
Eosinophils Absolute: 0.2 10*3/uL (ref 0.0–0.5)
Eosinophils Relative: 4 %
HCT: 38.6 % — ABNORMAL LOW (ref 39.0–52.0)
Hemoglobin: 12.2 g/dL — ABNORMAL LOW (ref 13.0–17.0)
Immature Granulocytes: 0 %
Lymphocytes Relative: 12 %
Lymphs Abs: 0.6 10*3/uL — ABNORMAL LOW (ref 0.7–4.0)
MCH: 29.6 pg (ref 26.0–34.0)
MCHC: 31.6 g/dL (ref 30.0–36.0)
MCV: 93.7 fL (ref 80.0–100.0)
Monocytes Absolute: 0.6 10*3/uL (ref 0.1–1.0)
Monocytes Relative: 11 %
Neutro Abs: 3.7 10*3/uL (ref 1.7–7.7)
Neutrophils Relative %: 72 %
Platelets: 151 10*3/uL (ref 150–400)
RBC: 4.12 MIL/uL — ABNORMAL LOW (ref 4.22–5.81)
RDW: 15 % (ref 11.5–15.5)
WBC: 5.2 10*3/uL (ref 4.0–10.5)
nRBC: 0 % (ref 0.0–0.2)

## 2023-06-09 LAB — URINALYSIS, W/ REFLEX TO CULTURE (INFECTION SUSPECTED)
Bilirubin Urine: NEGATIVE
Glucose, UA: NEGATIVE mg/dL
Hgb urine dipstick: NEGATIVE
Ketones, ur: NEGATIVE mg/dL
Nitrite: NEGATIVE
Protein, ur: NEGATIVE mg/dL
Specific Gravity, Urine: 1.023 (ref 1.005–1.030)
pH: 5 (ref 5.0–8.0)

## 2023-06-09 LAB — LACTIC ACID, PLASMA: Lactic Acid, Venous: 1.6 mmol/L (ref 0.5–1.9)

## 2023-06-09 MED ORDER — LEVOFLOXACIN 750 MG PO TABS: 750.0000 mg | ORAL_TABLET | Freq: Every day | ORAL | 0 refills | Status: DC

## 2023-06-09 MED ORDER — LACTATED RINGERS IV BOLUS
1000.0000 mL | Freq: Once | INTRAVENOUS | Status: AC
  Administered 2023-06-09: 1000 mL via INTRAVENOUS

## 2023-06-09 MED ORDER — LEVOFLOXACIN 750 MG PO TABS
750.0000 mg | ORAL_TABLET | Freq: Once | ORAL | Status: AC
  Administered 2023-06-09: 750 mg via ORAL
  Filled 2023-06-09: qty 1

## 2023-06-09 NOTE — ED Triage Notes (Signed)
 Pt to er, states that he went to see his pmd for a check up after getting out of the icu, states that his blood pressure was 60 and to go to the er.

## 2023-06-09 NOTE — ED Notes (Signed)
 AVS with prescriptions provided to and discussed with patient and family member at bedside. Pt verbalizes understanding of discharge instructions and denies any questions or concerns at this time. Pt has ride home. Pt taken out of department via W/C in no apparent distress.

## 2023-06-09 NOTE — Discharge Instructions (Signed)
 It looks like you may have a recurrent UTI.  Take the antibiotics as prescribed.  Return to the ER for worsening symptoms.

## 2023-06-09 NOTE — ED Provider Notes (Signed)
 [place] EMERGENCY DEPARTMENT AT [facility] Provider Note   CSN: [id number] Arrival date & time: 06/09/23  1139     History  Chief Complaint  Patient presents with   Hypotension    [name] is a 73 y.o. male.  73 year old male with past medical history of oropharyngeal cancer and diabetes presenting to the emergency department today with concern for low blood pressure at his outpatient follow-up appointment.  The patient was admitted earlier this month with sepsis secondary to urinary tract infection.  He has apparently been having issues on and off with hypotension since.  The patient states that he is feeling fine.  He denies any nausea, vomiting, abdominal pain, diarrhea, or urinary symptoms although he does have to straight cath for urine.  The patient's family states that he does have a cough but that this is not out of the ordinary for him since he is a survivor of oropharyngeal cancer and is currently in remission.  He has been on oxygen since he left the hospital.  He went to follow-up with his primary care doctor today and was sent to the ER for further evaluation.        Home Medications Prior to Admission medications   Medication Sig Start Date End Date Taking? Authorizing Provider  acetaminophen  (TYLENOL ) 500 MG tablet Take 2 tablets (1,000 mg total) by mouth every 6 (six) hours as needed. Patient taking differently: Take 1,000 mg by mouth every 6 (six) hours as needed for mild pain (pain score 1-3). 03/30/23  Yes Marlin Simmonds, PA-C  ALPRAZolam  (XANAX ) 0.5 MG tablet Take 1 tablet (0.5 mg total) by mouth 3 (three) times daily. 05/18/23  Yes Emokpae, Courage, MD  alprazolam  (XANAX ) 2 MG tablet Take 2 mg by mouth at bedtime.   Yes [provider]  citalopram  (CELEXA ) 40 MG tablet Take 1 tablet (40 mg total) by mouth daily. 05/18/23  Yes Emokpae, Courage, MD  levofloxacin  (LEVAQUIN ) 750 MG tablet Take 1 tablet (750 mg total) by mouth daily. 06/09/23  Yes  Carin Charleston, MD  OXYGEN Inhale 2 L into the lungs continuous.   Yes [provider]  rOPINIRole  (REQUIP ) 3 MG tablet Take 3 mg by mouth at bedtime. 05/14/23  Yes [provider]  rosuvastatin  (CRESTOR ) 10 MG tablet Take 10 mg by mouth daily. 03/15/23  Yes [provider]  traMADol  (ULTRAM ) 50 MG tablet Take 1 tablet (50 mg total) by mouth every 8 (eight) hours as needed (moderate to severe pain). 05/18/23  Yes Emokpae, Courage, MD  albuterol  (PROVENTIL ) (2.5 MG/3ML) 0.083% nebulizer solution Take 3 mLs (2.5 mg total) by nebulization every 4 (four) hours as needed for wheezing or shortness of breath. 05/18/23   Colin Dawley, MD  ALPRAZolam  (XANAX ) 1 MG tablet Take 1 tablet (1 mg total) by mouth at bedtime. Patient not taking: Reported on 06/09/2023 05/18/23   Colin Dawley, MD      Allergies    Neurontin [gabapentin], Buspirone, and Dilaudid  [hydromorphone  hcl]    Review of Systems   Review of Systems  Constitutional:  Negative for fever.  Respiratory:  Positive for cough.   Genitourinary:  Negative for dysuria.  All other systems reviewed and are negative.   Physical Exam Updated Vital Signs BP 134/80   Pulse 69   Temp 98.4 F (36.9 C) (Oral)   Resp 17   Ht 6\' 4"  (1.93 m)   Wt 73.9 kg   SpO2 98%   BMI 19.84  kg/m  Physical Exam Vitals and nursing note reviewed.   Gen: NAD, chronically ill-appearing Eyes: PERRL, EOMI HEENT: no oropharyngeal swelling Neck: trachea midline Resp: clear to auscultation bilaterally Card: RRR, no murmurs, rubs, or gallops Abd: nontender, nondistended Extremities: no calf tenderness, no edema Vascular: 2+ radial pulses bilaterally, 2+ DP pulses bilaterally Skin: no rashes Psyc: acting appropriately   ED Results / Procedures / Treatments   Labs (all labs ordered are listed, but only abnormal results are displayed) Labs Reviewed  COMPREHENSIVE METABOLIC PANEL WITH GFR - Abnormal; Notable for the following components:       Result Value   Glucose, Bld 100 (*)    BUN 25 (*)    Albumin 3.4 (*)    All other components within normal limits  CBC WITH DIFFERENTIAL/PLATELET - Abnormal; Notable for the following components:   RBC 4.12 (*)    Hemoglobin 12.2 (*)    HCT 38.6 (*)    Lymphs Abs 0.6 (*)    All other components within normal limits  URINALYSIS, W/ REFLEX TO CULTURE (INFECTION SUSPECTED) - Abnormal; Notable for the following components:   Color, Urine AMBER (*)    APPearance HAZY (*)    Leukocytes,Ua LARGE (*)    Bacteria, UA RARE (*)    All other components within normal limits  CULTURE, BLOOD (ROUTINE X 2)  CULTURE, BLOOD (ROUTINE X 2)  URINE CULTURE  LACTIC ACID, PLASMA    EKG None  Radiology DG Chest Port 1 View Result Date: 06/09/2023 CLINICAL DATA:  Questionable sepsis - evaluate for abnormality EXAM: PORTABLE CHEST - 1 VIEW COMPARISON:  May 15, 2023 FINDINGS: Biapical pleural thickening. Right chest port in place terminating in the lower SVC. No focal airspace consolidation, pleural effusion, or pneumothorax. No cardiomegaly. No acute fracture or destructive lesions. Multilevel thoracic osteophytosis. IMPRESSION: No acute cardiopulmonary abnormality. Electronically Signed   By: Rance Burrows M.D.   On: 06/09/2023 16:24    Procedures Procedures    Medications Ordered in ED Medications  lactated ringers  bolus 1,000 mL (0 mLs Intravenous Stopped 06/09/23 1631)  levofloxacin  (LEVAQUIN ) tablet 750 mg (750 mg Oral Given 06/09/23 1635)    ED Course/ Medical Decision Making/ A&P                                 Medical Decision Making 73 year old male with past medical history of diabetes and oropharyngeal cancer in remission presenting to the emergency department today with hypotension.  The patient's blood pressure here actually in the 90s and low 100s systolic although his initial 1 when he arrived was in the 80s.  Will initiate a sepsis workup and unclear if the patient is actually  septic at this time.  Will hold off on antibiotics until his workup is complete.  Unsure of the accuracy of the initial reading at the office in the 60s systolic since the patient is essentially asymptomatic.  I will reevaluate for ultimate disposition.  The patient's labs are largely reassuring.  The patient does have some bacteria and leukocytes in his urine and pyuria.  He is denying any specific symptoms but we will go ahead and treat with Levaquin  based on most recent urine culture.  The patient received 1 L of fluids and his blood pressure has been stable now for multiple readings.  Given his recent admission we did discuss admission for IV antibiotics and further observation but ultimately through shared decision making  the patient will be discharged on oral Levaquin  with return precautions.  Amount and/or Complexity of Data Reviewed Labs: ordered. Radiology: ordered.  Risk Prescription drug management.           Final Clinical Impression(s) / ED Diagnoses Final diagnoses:  Urinary tract infection without hematuria, site unspecified    Rx / DC Orders ED Discharge Orders          Ordered    levofloxacin  (LEVAQUIN ) 750 MG tablet  Daily        06/09/23 1638              Carin Charleston, MD 06/09/23 1639

## 2023-06-12 DIAGNOSIS — Z9981 Dependence on supplemental oxygen: Secondary | ICD-10-CM | POA: Diagnosis not present

## 2023-06-12 DIAGNOSIS — G928 Other toxic encephalopathy: Secondary | ICD-10-CM | POA: Diagnosis not present

## 2023-06-12 DIAGNOSIS — E785 Hyperlipidemia, unspecified: Secondary | ICD-10-CM | POA: Diagnosis not present

## 2023-06-12 DIAGNOSIS — N390 Urinary tract infection, site not specified: Secondary | ICD-10-CM | POA: Diagnosis not present

## 2023-06-12 DIAGNOSIS — K5900 Constipation, unspecified: Secondary | ICD-10-CM | POA: Diagnosis not present

## 2023-06-12 DIAGNOSIS — J9622 Acute and chronic respiratory failure with hypercapnia: Secondary | ICD-10-CM | POA: Diagnosis not present

## 2023-06-12 DIAGNOSIS — F419 Anxiety disorder, unspecified: Secondary | ICD-10-CM | POA: Diagnosis not present

## 2023-06-12 DIAGNOSIS — M9701XD Periprosthetic fracture around internal prosthetic right hip joint, subsequent encounter: Secondary | ICD-10-CM | POA: Diagnosis not present

## 2023-06-12 DIAGNOSIS — Z923 Personal history of irradiation: Secondary | ICD-10-CM | POA: Diagnosis not present

## 2023-06-12 DIAGNOSIS — E1142 Type 2 diabetes mellitus with diabetic polyneuropathy: Secondary | ICD-10-CM | POA: Diagnosis not present

## 2023-06-12 DIAGNOSIS — R296 Repeated falls: Secondary | ICD-10-CM | POA: Diagnosis not present

## 2023-06-12 DIAGNOSIS — R6521 Severe sepsis with septic shock: Secondary | ICD-10-CM | POA: Diagnosis not present

## 2023-06-12 DIAGNOSIS — M150 Primary generalized (osteo)arthritis: Secondary | ICD-10-CM | POA: Diagnosis not present

## 2023-06-12 DIAGNOSIS — M797 Fibromyalgia: Secondary | ICD-10-CM | POA: Diagnosis not present

## 2023-06-12 DIAGNOSIS — Z96641 Presence of right artificial hip joint: Secondary | ICD-10-CM | POA: Diagnosis not present

## 2023-06-12 DIAGNOSIS — Z85118 Personal history of other malignant neoplasm of bronchus and lung: Secondary | ICD-10-CM | POA: Diagnosis not present

## 2023-06-12 DIAGNOSIS — F329 Major depressive disorder, single episode, unspecified: Secondary | ICD-10-CM | POA: Diagnosis not present

## 2023-06-12 DIAGNOSIS — Z556 Problems related to health literacy: Secondary | ICD-10-CM | POA: Diagnosis not present

## 2023-06-12 DIAGNOSIS — E441 Mild protein-calorie malnutrition: Secondary | ICD-10-CM | POA: Diagnosis not present

## 2023-06-12 DIAGNOSIS — A4159 Other Gram-negative sepsis: Secondary | ICD-10-CM | POA: Diagnosis not present

## 2023-06-12 DIAGNOSIS — N179 Acute kidney failure, unspecified: Secondary | ICD-10-CM | POA: Diagnosis not present

## 2023-06-12 DIAGNOSIS — S72121D Displaced fracture of lesser trochanter of right femur, subsequent encounter for closed fracture with routine healing: Secondary | ICD-10-CM | POA: Diagnosis not present

## 2023-06-12 DIAGNOSIS — G3184 Mild cognitive impairment, so stated: Secondary | ICD-10-CM | POA: Diagnosis not present

## 2023-06-12 DIAGNOSIS — Z85818 Personal history of malignant neoplasm of other sites of lip, oral cavity, and pharynx: Secondary | ICD-10-CM | POA: Diagnosis not present

## 2023-06-12 LAB — URINE CULTURE: Culture: >=100000 — AB

## 2023-06-13 ENCOUNTER — Telehealth (HOSPITAL_BASED_OUTPATIENT_CLINIC_OR_DEPARTMENT_OTHER): Payer: Self-pay | Admitting: *Deleted

## 2023-06-13 NOTE — Telephone Encounter (Signed)
 Post ED Visit - Positive Culture Follow-up: Successful Patient Follow-Up  Culture assessed and recommendations reviewed by:  []  Court Distance, Pharm.D. []  Skeet Duke, Pharm.D., BCPS AQ-ID []  Leslee Rase, Pharm.D., BCPS []  Garland Junk, Pharm.D., BCPS []  [place], [address].D., BCPS, AAHIVP []  Alcide Aly, Pharm.D., BCPS, AAHIVP []  Jerri Morale, PharmD, BCPS []  Graham Laws, PharmD, BCPS []  Cleda Curly, PharmD, BCPS [x]  Trinidad Funk, PharmD  Positive urine culture  []  Patient discharged without antimicrobial prescription and treatment is now indicated [x]  Organism is resistant to prescribed ED discharge antimicrobial []  Patient with positive blood cultures  Changes discussed with ED provider: Dorothey Gate, PA New antibiotic prescription Macrobid 100mg  BID x 7d D/C Levaquin  Called to [employer], [place]  Contacted patients daughter, date 06/13/23, time 1048   Georgine Kitchens 06/13/2023, 10:53 AM

## 2023-06-13 NOTE — Progress Notes (Signed)
 ED Antimicrobial Stewardship Positive Culture Follow Up   [name] is an 73 y.o. male who presented to [facility] on 06/09/2023 with a chief complaint of  Chief Complaint  Patient presents with   Hypotension    Recent Results (from the past 720 hours)  Blood Culture (routine x 2)     Status: None (Preliminary result)   Collection Time: 06/09/23  1:38 PM   Specimen: BLOOD  Result Value Ref Range Status   Specimen Description BLOOD BLOOD RIGHT FOREARM  Final   Special Requests   Final    BOTTLES DRAWN AEROBIC AND ANAEROBIC Blood Culture adequate volume   Culture   Final    NO GROWTH 4 DAYS Performed at [facility], [address] [place], [place] [address]    Report Status PENDING  Incomplete  Blood Culture (routine x 2)     Status: None (Preliminary result)   Collection Time: 06/09/23  1:51 PM   Specimen: BLOOD  Result Value Ref Range Status   Specimen Description BLOOD LEFT ANTECUBITAL  Final   Special Requests   Final    BOTTLES DRAWN AEROBIC AND ANAEROBIC Blood Culture adequate volume   Culture   Final    NO GROWTH 4 DAYS Performed at [facility], [address], [place], [place] [address]    Report Status PENDING  Incomplete  Urine Culture     Status: Abnormal   Collection Time: 06/09/23  2:30 PM   Specimen: Urine, Random  Result Value Ref Range Status   Specimen Description   Final    URINE, RANDOM Performed at [facility], [address] [place], [place] [address]    Special Requests   Final    NONE Reflexed from [id number] Performed at [facility], [address], [place], [place] [address]    Culture (A)  Final    >=100,000 COLONIES/mL ESCHERICHIA COLI Confirmed Extended Spectrum Beta-Lactamase Producer (ESBL).  In bloodstream infections from ESBL organisms, carbapenems are preferred over piperacillin /tazobactam. They are shown to have a lower risk of mortality.    Report Status 06/12/2023 FINAL  Final   Organism ID, Bacteria ESCHERICHIA COLI (A)  Final       Susceptibility   Escherichia coli - MIC*    AMPICILLIN >=32 RESISTANT Resistant     CEFAZOLIN  >=64 RESISTANT Resistant     CEFEPIME 16 RESISTANT Resistant     CEFTRIAXONE  >=64 RESISTANT Resistant     CIPROFLOXACIN >=4 RESISTANT Resistant     GENTAMICIN <=1 SENSITIVE Sensitive     IMIPENEM <=0.25 SENSITIVE Sensitive     NITROFURANTOIN <=16 SENSITIVE Sensitive     TRIMETH/SULFA >=320 RESISTANT Resistant     AMPICILLIN/SULBACTAM 8 SENSITIVE Sensitive     PIP/TAZO <=4 SENSITIVE Sensitive ug/mL    * >=100,000 COLONIES/mL ESCHERICHIA COLI    [x]  Treated with levaquin , organism resistant to prescribed antimicrobial []  Patient discharged originally without antimicrobial agent and treatment is now indicated  Plan:  Call for sx check, if no issues do not treat, if having s/s urinary tract infection give Macrobid  ED Provider: Marcia Setters, PA-C   Trinidad Funk 06/13/2023, 10:11 AM Clinical Pharmacist Monday - Friday phone -  [phone] Saturday - Sunday phone - [phone]

## 2023-06-14 LAB — CULTURE, BLOOD (ROUTINE X 2)
Culture: NO GROWTH
Culture: NO GROWTH
Special Requests: ADEQUATE
Special Requests: ADEQUATE

## 2023-06-15 DIAGNOSIS — M150 Primary generalized (osteo)arthritis: Secondary | ICD-10-CM | POA: Diagnosis not present

## 2023-06-15 DIAGNOSIS — E441 Mild protein-calorie malnutrition: Secondary | ICD-10-CM | POA: Diagnosis not present

## 2023-06-15 DIAGNOSIS — N390 Urinary tract infection, site not specified: Secondary | ICD-10-CM | POA: Diagnosis not present

## 2023-06-15 DIAGNOSIS — Z85118 Personal history of other malignant neoplasm of bronchus and lung: Secondary | ICD-10-CM | POA: Diagnosis not present

## 2023-06-15 DIAGNOSIS — G928 Other toxic encephalopathy: Secondary | ICD-10-CM | POA: Diagnosis not present

## 2023-06-15 DIAGNOSIS — Z85818 Personal history of malignant neoplasm of other sites of lip, oral cavity, and pharynx: Secondary | ICD-10-CM | POA: Diagnosis not present

## 2023-06-15 DIAGNOSIS — Z923 Personal history of irradiation: Secondary | ICD-10-CM | POA: Diagnosis not present

## 2023-06-15 DIAGNOSIS — M9701XD Periprosthetic fracture around internal prosthetic right hip joint, subsequent encounter: Secondary | ICD-10-CM | POA: Diagnosis not present

## 2023-06-15 DIAGNOSIS — F419 Anxiety disorder, unspecified: Secondary | ICD-10-CM | POA: Diagnosis not present

## 2023-06-15 DIAGNOSIS — E1142 Type 2 diabetes mellitus with diabetic polyneuropathy: Secondary | ICD-10-CM | POA: Diagnosis not present

## 2023-06-15 DIAGNOSIS — G3184 Mild cognitive impairment, so stated: Secondary | ICD-10-CM | POA: Diagnosis not present

## 2023-06-15 DIAGNOSIS — N179 Acute kidney failure, unspecified: Secondary | ICD-10-CM | POA: Diagnosis not present

## 2023-06-15 DIAGNOSIS — A4159 Other Gram-negative sepsis: Secondary | ICD-10-CM | POA: Diagnosis not present

## 2023-06-15 DIAGNOSIS — Z9981 Dependence on supplemental oxygen: Secondary | ICD-10-CM | POA: Diagnosis not present

## 2023-06-15 DIAGNOSIS — S72121D Displaced fracture of lesser trochanter of right femur, subsequent encounter for closed fracture with routine healing: Secondary | ICD-10-CM | POA: Diagnosis not present

## 2023-06-15 DIAGNOSIS — E785 Hyperlipidemia, unspecified: Secondary | ICD-10-CM | POA: Diagnosis not present

## 2023-06-15 DIAGNOSIS — F329 Major depressive disorder, single episode, unspecified: Secondary | ICD-10-CM | POA: Diagnosis not present

## 2023-06-15 DIAGNOSIS — J9622 Acute and chronic respiratory failure with hypercapnia: Secondary | ICD-10-CM | POA: Diagnosis not present

## 2023-06-15 DIAGNOSIS — Z96641 Presence of right artificial hip joint: Secondary | ICD-10-CM | POA: Diagnosis not present

## 2023-06-15 DIAGNOSIS — K5900 Constipation, unspecified: Secondary | ICD-10-CM | POA: Diagnosis not present

## 2023-06-15 DIAGNOSIS — M797 Fibromyalgia: Secondary | ICD-10-CM | POA: Diagnosis not present

## 2023-06-15 DIAGNOSIS — R6521 Severe sepsis with septic shock: Secondary | ICD-10-CM | POA: Diagnosis not present

## 2023-06-15 DIAGNOSIS — R296 Repeated falls: Secondary | ICD-10-CM | POA: Diagnosis not present

## 2023-06-15 DIAGNOSIS — Z556 Problems related to health literacy: Secondary | ICD-10-CM | POA: Diagnosis not present

## 2023-06-17 DIAGNOSIS — Z9981 Dependence on supplemental oxygen: Secondary | ICD-10-CM | POA: Diagnosis not present

## 2023-06-17 DIAGNOSIS — F329 Major depressive disorder, single episode, unspecified: Secondary | ICD-10-CM | POA: Diagnosis not present

## 2023-06-17 DIAGNOSIS — E441 Mild protein-calorie malnutrition: Secondary | ICD-10-CM | POA: Diagnosis not present

## 2023-06-17 DIAGNOSIS — E785 Hyperlipidemia, unspecified: Secondary | ICD-10-CM | POA: Diagnosis not present

## 2023-06-17 DIAGNOSIS — K5900 Constipation, unspecified: Secondary | ICD-10-CM | POA: Diagnosis not present

## 2023-06-17 DIAGNOSIS — N179 Acute kidney failure, unspecified: Secondary | ICD-10-CM | POA: Diagnosis not present

## 2023-06-17 DIAGNOSIS — Z556 Problems related to health literacy: Secondary | ICD-10-CM | POA: Diagnosis not present

## 2023-06-17 DIAGNOSIS — A4159 Other Gram-negative sepsis: Secondary | ICD-10-CM | POA: Diagnosis not present

## 2023-06-17 DIAGNOSIS — Z85118 Personal history of other malignant neoplasm of bronchus and lung: Secondary | ICD-10-CM | POA: Diagnosis not present

## 2023-06-17 DIAGNOSIS — G3184 Mild cognitive impairment, so stated: Secondary | ICD-10-CM | POA: Diagnosis not present

## 2023-06-17 DIAGNOSIS — S72121D Displaced fracture of lesser trochanter of right femur, subsequent encounter for closed fracture with routine healing: Secondary | ICD-10-CM | POA: Diagnosis not present

## 2023-06-17 DIAGNOSIS — F419 Anxiety disorder, unspecified: Secondary | ICD-10-CM | POA: Diagnosis not present

## 2023-06-17 DIAGNOSIS — N390 Urinary tract infection, site not specified: Secondary | ICD-10-CM | POA: Diagnosis not present

## 2023-06-17 DIAGNOSIS — M9701XD Periprosthetic fracture around internal prosthetic right hip joint, subsequent encounter: Secondary | ICD-10-CM | POA: Diagnosis not present

## 2023-06-17 DIAGNOSIS — Z96641 Presence of right artificial hip joint: Secondary | ICD-10-CM | POA: Diagnosis not present

## 2023-06-17 DIAGNOSIS — R296 Repeated falls: Secondary | ICD-10-CM | POA: Diagnosis not present

## 2023-06-17 DIAGNOSIS — R6521 Severe sepsis with septic shock: Secondary | ICD-10-CM | POA: Diagnosis not present

## 2023-06-17 DIAGNOSIS — J9622 Acute and chronic respiratory failure with hypercapnia: Secondary | ICD-10-CM | POA: Diagnosis not present

## 2023-06-17 DIAGNOSIS — M797 Fibromyalgia: Secondary | ICD-10-CM | POA: Diagnosis not present

## 2023-06-17 DIAGNOSIS — Z85818 Personal history of malignant neoplasm of other sites of lip, oral cavity, and pharynx: Secondary | ICD-10-CM | POA: Diagnosis not present

## 2023-06-17 DIAGNOSIS — E1142 Type 2 diabetes mellitus with diabetic polyneuropathy: Secondary | ICD-10-CM | POA: Diagnosis not present

## 2023-06-17 DIAGNOSIS — Z923 Personal history of irradiation: Secondary | ICD-10-CM | POA: Diagnosis not present

## 2023-06-17 DIAGNOSIS — G928 Other toxic encephalopathy: Secondary | ICD-10-CM | POA: Diagnosis not present

## 2023-06-17 DIAGNOSIS — M150 Primary generalized (osteo)arthritis: Secondary | ICD-10-CM | POA: Diagnosis not present

## 2023-06-18 DIAGNOSIS — J9622 Acute and chronic respiratory failure with hypercapnia: Secondary | ICD-10-CM | POA: Diagnosis not present

## 2023-06-18 DIAGNOSIS — Z85818 Personal history of malignant neoplasm of other sites of lip, oral cavity, and pharynx: Secondary | ICD-10-CM | POA: Diagnosis not present

## 2023-06-18 DIAGNOSIS — E785 Hyperlipidemia, unspecified: Secondary | ICD-10-CM | POA: Diagnosis not present

## 2023-06-18 DIAGNOSIS — Z923 Personal history of irradiation: Secondary | ICD-10-CM | POA: Diagnosis not present

## 2023-06-18 DIAGNOSIS — F329 Major depressive disorder, single episode, unspecified: Secondary | ICD-10-CM | POA: Diagnosis not present

## 2023-06-18 DIAGNOSIS — K5900 Constipation, unspecified: Secondary | ICD-10-CM | POA: Diagnosis not present

## 2023-06-18 DIAGNOSIS — M150 Primary generalized (osteo)arthritis: Secondary | ICD-10-CM | POA: Diagnosis not present

## 2023-06-18 DIAGNOSIS — R6521 Severe sepsis with septic shock: Secondary | ICD-10-CM | POA: Diagnosis not present

## 2023-06-18 DIAGNOSIS — G3184 Mild cognitive impairment, so stated: Secondary | ICD-10-CM | POA: Diagnosis not present

## 2023-06-18 DIAGNOSIS — N390 Urinary tract infection, site not specified: Secondary | ICD-10-CM | POA: Diagnosis not present

## 2023-06-18 DIAGNOSIS — N179 Acute kidney failure, unspecified: Secondary | ICD-10-CM | POA: Diagnosis not present

## 2023-06-18 DIAGNOSIS — G928 Other toxic encephalopathy: Secondary | ICD-10-CM | POA: Diagnosis not present

## 2023-06-18 DIAGNOSIS — S72121D Displaced fracture of lesser trochanter of right femur, subsequent encounter for closed fracture with routine healing: Secondary | ICD-10-CM | POA: Diagnosis not present

## 2023-06-18 DIAGNOSIS — F419 Anxiety disorder, unspecified: Secondary | ICD-10-CM | POA: Diagnosis not present

## 2023-06-18 DIAGNOSIS — Z556 Problems related to health literacy: Secondary | ICD-10-CM | POA: Diagnosis not present

## 2023-06-18 DIAGNOSIS — M9701XD Periprosthetic fracture around internal prosthetic right hip joint, subsequent encounter: Secondary | ICD-10-CM | POA: Diagnosis not present

## 2023-06-18 DIAGNOSIS — E441 Mild protein-calorie malnutrition: Secondary | ICD-10-CM | POA: Diagnosis not present

## 2023-06-18 DIAGNOSIS — Z9981 Dependence on supplemental oxygen: Secondary | ICD-10-CM | POA: Diagnosis not present

## 2023-06-18 DIAGNOSIS — Z85118 Personal history of other malignant neoplasm of bronchus and lung: Secondary | ICD-10-CM | POA: Diagnosis not present

## 2023-06-18 DIAGNOSIS — R296 Repeated falls: Secondary | ICD-10-CM | POA: Diagnosis not present

## 2023-06-18 DIAGNOSIS — Z96641 Presence of right artificial hip joint: Secondary | ICD-10-CM | POA: Diagnosis not present

## 2023-06-18 DIAGNOSIS — M797 Fibromyalgia: Secondary | ICD-10-CM | POA: Diagnosis not present

## 2023-06-18 DIAGNOSIS — A4159 Other Gram-negative sepsis: Secondary | ICD-10-CM | POA: Diagnosis not present

## 2023-06-18 DIAGNOSIS — E1142 Type 2 diabetes mellitus with diabetic polyneuropathy: Secondary | ICD-10-CM | POA: Diagnosis not present

## 2023-06-22 DIAGNOSIS — R6521 Severe sepsis with septic shock: Secondary | ICD-10-CM | POA: Diagnosis not present

## 2023-06-22 DIAGNOSIS — G928 Other toxic encephalopathy: Secondary | ICD-10-CM | POA: Diagnosis not present

## 2023-06-22 DIAGNOSIS — A4159 Other Gram-negative sepsis: Secondary | ICD-10-CM | POA: Diagnosis not present

## 2023-06-22 DIAGNOSIS — N179 Acute kidney failure, unspecified: Secondary | ICD-10-CM | POA: Diagnosis not present

## 2023-06-22 DIAGNOSIS — M150 Primary generalized (osteo)arthritis: Secondary | ICD-10-CM | POA: Diagnosis not present

## 2023-06-22 DIAGNOSIS — Z96641 Presence of right artificial hip joint: Secondary | ICD-10-CM | POA: Diagnosis not present

## 2023-06-22 DIAGNOSIS — J9622 Acute and chronic respiratory failure with hypercapnia: Secondary | ICD-10-CM | POA: Diagnosis not present

## 2023-06-22 DIAGNOSIS — Z556 Problems related to health literacy: Secondary | ICD-10-CM | POA: Diagnosis not present

## 2023-06-22 DIAGNOSIS — R296 Repeated falls: Secondary | ICD-10-CM | POA: Diagnosis not present

## 2023-06-22 DIAGNOSIS — Z9981 Dependence on supplemental oxygen: Secondary | ICD-10-CM | POA: Diagnosis not present

## 2023-06-22 DIAGNOSIS — E785 Hyperlipidemia, unspecified: Secondary | ICD-10-CM | POA: Diagnosis not present

## 2023-06-22 DIAGNOSIS — E1142 Type 2 diabetes mellitus with diabetic polyneuropathy: Secondary | ICD-10-CM | POA: Diagnosis not present

## 2023-06-22 DIAGNOSIS — M9701XD Periprosthetic fracture around internal prosthetic right hip joint, subsequent encounter: Secondary | ICD-10-CM | POA: Diagnosis not present

## 2023-06-22 DIAGNOSIS — E441 Mild protein-calorie malnutrition: Secondary | ICD-10-CM | POA: Diagnosis not present

## 2023-06-22 DIAGNOSIS — Z923 Personal history of irradiation: Secondary | ICD-10-CM | POA: Diagnosis not present

## 2023-06-22 DIAGNOSIS — Z85118 Personal history of other malignant neoplasm of bronchus and lung: Secondary | ICD-10-CM | POA: Diagnosis not present

## 2023-06-22 DIAGNOSIS — F419 Anxiety disorder, unspecified: Secondary | ICD-10-CM | POA: Diagnosis not present

## 2023-06-22 DIAGNOSIS — F329 Major depressive disorder, single episode, unspecified: Secondary | ICD-10-CM | POA: Diagnosis not present

## 2023-06-22 DIAGNOSIS — S72121D Displaced fracture of lesser trochanter of right femur, subsequent encounter for closed fracture with routine healing: Secondary | ICD-10-CM | POA: Diagnosis not present

## 2023-06-22 DIAGNOSIS — N390 Urinary tract infection, site not specified: Secondary | ICD-10-CM | POA: Diagnosis not present

## 2023-06-22 DIAGNOSIS — K5900 Constipation, unspecified: Secondary | ICD-10-CM | POA: Diagnosis not present

## 2023-06-22 DIAGNOSIS — Z85818 Personal history of malignant neoplasm of other sites of lip, oral cavity, and pharynx: Secondary | ICD-10-CM | POA: Diagnosis not present

## 2023-06-22 DIAGNOSIS — M797 Fibromyalgia: Secondary | ICD-10-CM | POA: Diagnosis not present

## 2023-06-22 DIAGNOSIS — G3184 Mild cognitive impairment, so stated: Secondary | ICD-10-CM | POA: Diagnosis not present

## 2023-06-24 DIAGNOSIS — Z556 Problems related to health literacy: Secondary | ICD-10-CM | POA: Diagnosis not present

## 2023-06-24 DIAGNOSIS — E785 Hyperlipidemia, unspecified: Secondary | ICD-10-CM | POA: Diagnosis not present

## 2023-06-24 DIAGNOSIS — Z9981 Dependence on supplemental oxygen: Secondary | ICD-10-CM | POA: Diagnosis not present

## 2023-06-24 DIAGNOSIS — J9622 Acute and chronic respiratory failure with hypercapnia: Secondary | ICD-10-CM | POA: Diagnosis not present

## 2023-06-24 DIAGNOSIS — A4159 Other Gram-negative sepsis: Secondary | ICD-10-CM | POA: Diagnosis not present

## 2023-06-24 DIAGNOSIS — Z96641 Presence of right artificial hip joint: Secondary | ICD-10-CM | POA: Diagnosis not present

## 2023-06-24 DIAGNOSIS — N179 Acute kidney failure, unspecified: Secondary | ICD-10-CM | POA: Diagnosis not present

## 2023-06-24 DIAGNOSIS — Z923 Personal history of irradiation: Secondary | ICD-10-CM | POA: Diagnosis not present

## 2023-06-24 DIAGNOSIS — Z85818 Personal history of malignant neoplasm of other sites of lip, oral cavity, and pharynx: Secondary | ICD-10-CM | POA: Diagnosis not present

## 2023-06-24 DIAGNOSIS — F419 Anxiety disorder, unspecified: Secondary | ICD-10-CM | POA: Diagnosis not present

## 2023-06-24 DIAGNOSIS — R6521 Severe sepsis with septic shock: Secondary | ICD-10-CM | POA: Diagnosis not present

## 2023-06-24 DIAGNOSIS — F329 Major depressive disorder, single episode, unspecified: Secondary | ICD-10-CM | POA: Diagnosis not present

## 2023-06-24 DIAGNOSIS — S72121D Displaced fracture of lesser trochanter of right femur, subsequent encounter for closed fracture with routine healing: Secondary | ICD-10-CM | POA: Diagnosis not present

## 2023-06-24 DIAGNOSIS — Z85118 Personal history of other malignant neoplasm of bronchus and lung: Secondary | ICD-10-CM | POA: Diagnosis not present

## 2023-06-24 DIAGNOSIS — M797 Fibromyalgia: Secondary | ICD-10-CM | POA: Diagnosis not present

## 2023-06-24 DIAGNOSIS — E441 Mild protein-calorie malnutrition: Secondary | ICD-10-CM | POA: Diagnosis not present

## 2023-06-24 DIAGNOSIS — R296 Repeated falls: Secondary | ICD-10-CM | POA: Diagnosis not present

## 2023-06-24 DIAGNOSIS — M9701XD Periprosthetic fracture around internal prosthetic right hip joint, subsequent encounter: Secondary | ICD-10-CM | POA: Diagnosis not present

## 2023-06-24 DIAGNOSIS — N390 Urinary tract infection, site not specified: Secondary | ICD-10-CM | POA: Diagnosis not present

## 2023-06-24 DIAGNOSIS — G928 Other toxic encephalopathy: Secondary | ICD-10-CM | POA: Diagnosis not present

## 2023-06-24 DIAGNOSIS — M150 Primary generalized (osteo)arthritis: Secondary | ICD-10-CM | POA: Diagnosis not present

## 2023-06-24 DIAGNOSIS — K5900 Constipation, unspecified: Secondary | ICD-10-CM | POA: Diagnosis not present

## 2023-06-24 DIAGNOSIS — E1142 Type 2 diabetes mellitus with diabetic polyneuropathy: Secondary | ICD-10-CM | POA: Diagnosis not present

## 2023-06-24 DIAGNOSIS — G3184 Mild cognitive impairment, so stated: Secondary | ICD-10-CM | POA: Diagnosis not present

## 2023-06-25 DIAGNOSIS — E441 Mild protein-calorie malnutrition: Secondary | ICD-10-CM | POA: Diagnosis not present

## 2023-06-25 DIAGNOSIS — R296 Repeated falls: Secondary | ICD-10-CM | POA: Diagnosis not present

## 2023-06-25 DIAGNOSIS — K5900 Constipation, unspecified: Secondary | ICD-10-CM | POA: Diagnosis not present

## 2023-06-25 DIAGNOSIS — S72121D Displaced fracture of lesser trochanter of right femur, subsequent encounter for closed fracture with routine healing: Secondary | ICD-10-CM | POA: Diagnosis not present

## 2023-06-25 DIAGNOSIS — F419 Anxiety disorder, unspecified: Secondary | ICD-10-CM | POA: Diagnosis not present

## 2023-06-25 DIAGNOSIS — G928 Other toxic encephalopathy: Secondary | ICD-10-CM | POA: Diagnosis not present

## 2023-06-25 DIAGNOSIS — E1142 Type 2 diabetes mellitus with diabetic polyneuropathy: Secondary | ICD-10-CM | POA: Diagnosis not present

## 2023-06-25 DIAGNOSIS — N179 Acute kidney failure, unspecified: Secondary | ICD-10-CM | POA: Diagnosis not present

## 2023-06-25 DIAGNOSIS — G3184 Mild cognitive impairment, so stated: Secondary | ICD-10-CM | POA: Diagnosis not present

## 2023-06-25 DIAGNOSIS — Z923 Personal history of irradiation: Secondary | ICD-10-CM | POA: Diagnosis not present

## 2023-06-25 DIAGNOSIS — M797 Fibromyalgia: Secondary | ICD-10-CM | POA: Diagnosis not present

## 2023-06-25 DIAGNOSIS — N390 Urinary tract infection, site not specified: Secondary | ICD-10-CM | POA: Diagnosis not present

## 2023-06-25 DIAGNOSIS — M150 Primary generalized (osteo)arthritis: Secondary | ICD-10-CM | POA: Diagnosis not present

## 2023-06-25 DIAGNOSIS — Z96641 Presence of right artificial hip joint: Secondary | ICD-10-CM | POA: Diagnosis not present

## 2023-06-25 DIAGNOSIS — E785 Hyperlipidemia, unspecified: Secondary | ICD-10-CM | POA: Diagnosis not present

## 2023-06-25 DIAGNOSIS — R6521 Severe sepsis with septic shock: Secondary | ICD-10-CM | POA: Diagnosis not present

## 2023-06-25 DIAGNOSIS — Z85818 Personal history of malignant neoplasm of other sites of lip, oral cavity, and pharynx: Secondary | ICD-10-CM | POA: Diagnosis not present

## 2023-06-25 DIAGNOSIS — Z85118 Personal history of other malignant neoplasm of bronchus and lung: Secondary | ICD-10-CM | POA: Diagnosis not present

## 2023-06-25 DIAGNOSIS — M9701XD Periprosthetic fracture around internal prosthetic right hip joint, subsequent encounter: Secondary | ICD-10-CM | POA: Diagnosis not present

## 2023-06-25 DIAGNOSIS — A4159 Other Gram-negative sepsis: Secondary | ICD-10-CM | POA: Diagnosis not present

## 2023-06-25 DIAGNOSIS — Z556 Problems related to health literacy: Secondary | ICD-10-CM | POA: Diagnosis not present

## 2023-06-25 DIAGNOSIS — Z9981 Dependence on supplemental oxygen: Secondary | ICD-10-CM | POA: Diagnosis not present

## 2023-06-25 DIAGNOSIS — J9622 Acute and chronic respiratory failure with hypercapnia: Secondary | ICD-10-CM | POA: Diagnosis not present

## 2023-06-25 DIAGNOSIS — F329 Major depressive disorder, single episode, unspecified: Secondary | ICD-10-CM | POA: Diagnosis not present

## 2023-06-29 DIAGNOSIS — Z556 Problems related to health literacy: Secondary | ICD-10-CM | POA: Diagnosis not present

## 2023-06-29 DIAGNOSIS — Z85118 Personal history of other malignant neoplasm of bronchus and lung: Secondary | ICD-10-CM | POA: Diagnosis not present

## 2023-06-29 DIAGNOSIS — M9701XD Periprosthetic fracture around internal prosthetic right hip joint, subsequent encounter: Secondary | ICD-10-CM | POA: Diagnosis not present

## 2023-06-29 DIAGNOSIS — A4159 Other Gram-negative sepsis: Secondary | ICD-10-CM | POA: Diagnosis not present

## 2023-06-29 DIAGNOSIS — M797 Fibromyalgia: Secondary | ICD-10-CM | POA: Diagnosis not present

## 2023-06-29 DIAGNOSIS — R296 Repeated falls: Secondary | ICD-10-CM | POA: Diagnosis not present

## 2023-06-29 DIAGNOSIS — E441 Mild protein-calorie malnutrition: Secondary | ICD-10-CM | POA: Diagnosis not present

## 2023-06-29 DIAGNOSIS — R6521 Severe sepsis with septic shock: Secondary | ICD-10-CM | POA: Diagnosis not present

## 2023-06-29 DIAGNOSIS — N390 Urinary tract infection, site not specified: Secondary | ICD-10-CM | POA: Diagnosis not present

## 2023-06-29 DIAGNOSIS — N179 Acute kidney failure, unspecified: Secondary | ICD-10-CM | POA: Diagnosis not present

## 2023-06-29 DIAGNOSIS — M150 Primary generalized (osteo)arthritis: Secondary | ICD-10-CM | POA: Diagnosis not present

## 2023-06-29 DIAGNOSIS — G928 Other toxic encephalopathy: Secondary | ICD-10-CM | POA: Diagnosis not present

## 2023-06-29 DIAGNOSIS — Z9981 Dependence on supplemental oxygen: Secondary | ICD-10-CM | POA: Diagnosis not present

## 2023-06-29 DIAGNOSIS — Z96641 Presence of right artificial hip joint: Secondary | ICD-10-CM | POA: Diagnosis not present

## 2023-06-29 DIAGNOSIS — K5900 Constipation, unspecified: Secondary | ICD-10-CM | POA: Diagnosis not present

## 2023-06-29 DIAGNOSIS — F329 Major depressive disorder, single episode, unspecified: Secondary | ICD-10-CM | POA: Diagnosis not present

## 2023-06-29 DIAGNOSIS — E1142 Type 2 diabetes mellitus with diabetic polyneuropathy: Secondary | ICD-10-CM | POA: Diagnosis not present

## 2023-06-29 DIAGNOSIS — Z923 Personal history of irradiation: Secondary | ICD-10-CM | POA: Diagnosis not present

## 2023-06-29 DIAGNOSIS — E785 Hyperlipidemia, unspecified: Secondary | ICD-10-CM | POA: Diagnosis not present

## 2023-06-29 DIAGNOSIS — Z85818 Personal history of malignant neoplasm of other sites of lip, oral cavity, and pharynx: Secondary | ICD-10-CM | POA: Diagnosis not present

## 2023-06-29 DIAGNOSIS — G3184 Mild cognitive impairment, so stated: Secondary | ICD-10-CM | POA: Diagnosis not present

## 2023-06-29 DIAGNOSIS — S72121D Displaced fracture of lesser trochanter of right femur, subsequent encounter for closed fracture with routine healing: Secondary | ICD-10-CM | POA: Diagnosis not present

## 2023-06-29 DIAGNOSIS — F419 Anxiety disorder, unspecified: Secondary | ICD-10-CM | POA: Diagnosis not present

## 2023-06-29 DIAGNOSIS — J9622 Acute and chronic respiratory failure with hypercapnia: Secondary | ICD-10-CM | POA: Diagnosis not present

## 2023-07-02 DIAGNOSIS — S72121D Displaced fracture of lesser trochanter of right femur, subsequent encounter for closed fracture with routine healing: Secondary | ICD-10-CM | POA: Diagnosis not present

## 2023-07-02 DIAGNOSIS — J9622 Acute and chronic respiratory failure with hypercapnia: Secondary | ICD-10-CM | POA: Diagnosis not present

## 2023-07-02 DIAGNOSIS — C098 Malignant neoplasm of overlapping sites of tonsil: Secondary | ICD-10-CM | POA: Diagnosis not present

## 2023-07-02 DIAGNOSIS — J9612 Chronic respiratory failure with hypercapnia: Secondary | ICD-10-CM | POA: Diagnosis not present

## 2023-07-02 DIAGNOSIS — C7800 Secondary malignant neoplasm of unspecified lung: Secondary | ICD-10-CM | POA: Diagnosis not present

## 2023-07-02 DIAGNOSIS — E441 Mild protein-calorie malnutrition: Secondary | ICD-10-CM | POA: Diagnosis not present

## 2023-07-02 DIAGNOSIS — E1142 Type 2 diabetes mellitus with diabetic polyneuropathy: Secondary | ICD-10-CM | POA: Diagnosis not present

## 2023-07-02 DIAGNOSIS — Z923 Personal history of irradiation: Secondary | ICD-10-CM | POA: Diagnosis not present

## 2023-07-02 DIAGNOSIS — F419 Anxiety disorder, unspecified: Secondary | ICD-10-CM | POA: Diagnosis not present

## 2023-07-02 DIAGNOSIS — Z556 Problems related to health literacy: Secondary | ICD-10-CM | POA: Diagnosis not present

## 2023-07-02 DIAGNOSIS — N390 Urinary tract infection, site not specified: Secondary | ICD-10-CM | POA: Diagnosis not present

## 2023-07-02 DIAGNOSIS — G928 Other toxic encephalopathy: Secondary | ICD-10-CM | POA: Diagnosis not present

## 2023-07-02 DIAGNOSIS — R6521 Severe sepsis with septic shock: Secondary | ICD-10-CM | POA: Diagnosis not present

## 2023-07-02 DIAGNOSIS — Z85818 Personal history of malignant neoplasm of other sites of lip, oral cavity, and pharynx: Secondary | ICD-10-CM | POA: Diagnosis not present

## 2023-07-02 DIAGNOSIS — K5900 Constipation, unspecified: Secondary | ICD-10-CM | POA: Diagnosis not present

## 2023-07-02 DIAGNOSIS — N179 Acute kidney failure, unspecified: Secondary | ICD-10-CM | POA: Diagnosis not present

## 2023-07-02 DIAGNOSIS — Z85118 Personal history of other malignant neoplasm of bronchus and lung: Secondary | ICD-10-CM | POA: Diagnosis not present

## 2023-07-02 DIAGNOSIS — E785 Hyperlipidemia, unspecified: Secondary | ICD-10-CM | POA: Diagnosis not present

## 2023-07-02 DIAGNOSIS — Z96641 Presence of right artificial hip joint: Secondary | ICD-10-CM | POA: Diagnosis not present

## 2023-07-02 DIAGNOSIS — J99 Respiratory disorders in diseases classified elsewhere: Secondary | ICD-10-CM | POA: Diagnosis not present

## 2023-07-02 DIAGNOSIS — A4159 Other Gram-negative sepsis: Secondary | ICD-10-CM | POA: Diagnosis not present

## 2023-07-02 DIAGNOSIS — M9701XD Periprosthetic fracture around internal prosthetic right hip joint, subsequent encounter: Secondary | ICD-10-CM | POA: Diagnosis not present

## 2023-07-02 DIAGNOSIS — G3184 Mild cognitive impairment, so stated: Secondary | ICD-10-CM | POA: Diagnosis not present

## 2023-07-02 DIAGNOSIS — Z9981 Dependence on supplemental oxygen: Secondary | ICD-10-CM | POA: Diagnosis not present

## 2023-07-02 DIAGNOSIS — R296 Repeated falls: Secondary | ICD-10-CM | POA: Diagnosis not present

## 2023-07-02 DIAGNOSIS — M797 Fibromyalgia: Secondary | ICD-10-CM | POA: Diagnosis not present

## 2023-07-02 DIAGNOSIS — F329 Major depressive disorder, single episode, unspecified: Secondary | ICD-10-CM | POA: Diagnosis not present

## 2023-07-02 DIAGNOSIS — M150 Primary generalized (osteo)arthritis: Secondary | ICD-10-CM | POA: Diagnosis not present

## 2023-07-03 DIAGNOSIS — Z85118 Personal history of other malignant neoplasm of bronchus and lung: Secondary | ICD-10-CM | POA: Diagnosis not present

## 2023-07-03 DIAGNOSIS — R6521 Severe sepsis with septic shock: Secondary | ICD-10-CM | POA: Diagnosis not present

## 2023-07-03 DIAGNOSIS — J9622 Acute and chronic respiratory failure with hypercapnia: Secondary | ICD-10-CM | POA: Diagnosis not present

## 2023-07-03 DIAGNOSIS — E441 Mild protein-calorie malnutrition: Secondary | ICD-10-CM | POA: Diagnosis not present

## 2023-07-03 DIAGNOSIS — Z85818 Personal history of malignant neoplasm of other sites of lip, oral cavity, and pharynx: Secondary | ICD-10-CM | POA: Diagnosis not present

## 2023-07-03 DIAGNOSIS — M150 Primary generalized (osteo)arthritis: Secondary | ICD-10-CM | POA: Diagnosis not present

## 2023-07-03 DIAGNOSIS — G3184 Mild cognitive impairment, so stated: Secondary | ICD-10-CM | POA: Diagnosis not present

## 2023-07-03 DIAGNOSIS — Z556 Problems related to health literacy: Secondary | ICD-10-CM | POA: Diagnosis not present

## 2023-07-03 DIAGNOSIS — N179 Acute kidney failure, unspecified: Secondary | ICD-10-CM | POA: Diagnosis not present

## 2023-07-03 DIAGNOSIS — E1142 Type 2 diabetes mellitus with diabetic polyneuropathy: Secondary | ICD-10-CM | POA: Diagnosis not present

## 2023-07-03 DIAGNOSIS — R296 Repeated falls: Secondary | ICD-10-CM | POA: Diagnosis not present

## 2023-07-03 DIAGNOSIS — Z923 Personal history of irradiation: Secondary | ICD-10-CM | POA: Diagnosis not present

## 2023-07-03 DIAGNOSIS — Z9981 Dependence on supplemental oxygen: Secondary | ICD-10-CM | POA: Diagnosis not present

## 2023-07-03 DIAGNOSIS — F329 Major depressive disorder, single episode, unspecified: Secondary | ICD-10-CM | POA: Diagnosis not present

## 2023-07-03 DIAGNOSIS — E785 Hyperlipidemia, unspecified: Secondary | ICD-10-CM | POA: Diagnosis not present

## 2023-07-03 DIAGNOSIS — Z96641 Presence of right artificial hip joint: Secondary | ICD-10-CM | POA: Diagnosis not present

## 2023-07-03 DIAGNOSIS — A4159 Other Gram-negative sepsis: Secondary | ICD-10-CM | POA: Diagnosis not present

## 2023-07-03 DIAGNOSIS — F419 Anxiety disorder, unspecified: Secondary | ICD-10-CM | POA: Diagnosis not present

## 2023-07-03 DIAGNOSIS — G928 Other toxic encephalopathy: Secondary | ICD-10-CM | POA: Diagnosis not present

## 2023-07-03 DIAGNOSIS — M9701XD Periprosthetic fracture around internal prosthetic right hip joint, subsequent encounter: Secondary | ICD-10-CM | POA: Diagnosis not present

## 2023-07-03 DIAGNOSIS — S72121D Displaced fracture of lesser trochanter of right femur, subsequent encounter for closed fracture with routine healing: Secondary | ICD-10-CM | POA: Diagnosis not present

## 2023-07-03 DIAGNOSIS — M797 Fibromyalgia: Secondary | ICD-10-CM | POA: Diagnosis not present

## 2023-07-03 DIAGNOSIS — N390 Urinary tract infection, site not specified: Secondary | ICD-10-CM | POA: Diagnosis not present

## 2023-07-03 DIAGNOSIS — K5900 Constipation, unspecified: Secondary | ICD-10-CM | POA: Diagnosis not present

## 2023-07-06 DIAGNOSIS — N390 Urinary tract infection, site not specified: Secondary | ICD-10-CM | POA: Diagnosis not present

## 2023-07-06 DIAGNOSIS — F419 Anxiety disorder, unspecified: Secondary | ICD-10-CM | POA: Diagnosis not present

## 2023-07-06 DIAGNOSIS — Z6820 Body mass index (BMI) 20.0-20.9, adult: Secondary | ICD-10-CM | POA: Diagnosis not present

## 2023-07-08 DIAGNOSIS — M9701XD Periprosthetic fracture around internal prosthetic right hip joint, subsequent encounter: Secondary | ICD-10-CM | POA: Diagnosis not present

## 2023-07-08 DIAGNOSIS — E1142 Type 2 diabetes mellitus with diabetic polyneuropathy: Secondary | ICD-10-CM | POA: Diagnosis not present

## 2023-07-08 DIAGNOSIS — A4159 Other Gram-negative sepsis: Secondary | ICD-10-CM | POA: Diagnosis not present

## 2023-07-08 DIAGNOSIS — S72121D Displaced fracture of lesser trochanter of right femur, subsequent encounter for closed fracture with routine healing: Secondary | ICD-10-CM | POA: Diagnosis not present

## 2023-07-10 DIAGNOSIS — M797 Fibromyalgia: Secondary | ICD-10-CM | POA: Diagnosis not present

## 2023-07-10 DIAGNOSIS — G3184 Mild cognitive impairment, so stated: Secondary | ICD-10-CM | POA: Diagnosis not present

## 2023-07-10 DIAGNOSIS — Z923 Personal history of irradiation: Secondary | ICD-10-CM | POA: Diagnosis not present

## 2023-07-10 DIAGNOSIS — E785 Hyperlipidemia, unspecified: Secondary | ICD-10-CM | POA: Diagnosis not present

## 2023-07-10 DIAGNOSIS — F329 Major depressive disorder, single episode, unspecified: Secondary | ICD-10-CM | POA: Diagnosis not present

## 2023-07-10 DIAGNOSIS — R296 Repeated falls: Secondary | ICD-10-CM | POA: Diagnosis not present

## 2023-07-10 DIAGNOSIS — K5900 Constipation, unspecified: Secondary | ICD-10-CM | POA: Diagnosis not present

## 2023-07-10 DIAGNOSIS — Z556 Problems related to health literacy: Secondary | ICD-10-CM | POA: Diagnosis not present

## 2023-07-10 DIAGNOSIS — F419 Anxiety disorder, unspecified: Secondary | ICD-10-CM | POA: Diagnosis not present

## 2023-07-10 DIAGNOSIS — Z96641 Presence of right artificial hip joint: Secondary | ICD-10-CM | POA: Diagnosis not present

## 2023-07-10 DIAGNOSIS — N179 Acute kidney failure, unspecified: Secondary | ICD-10-CM | POA: Diagnosis not present

## 2023-07-10 DIAGNOSIS — Z85118 Personal history of other malignant neoplasm of bronchus and lung: Secondary | ICD-10-CM | POA: Diagnosis not present

## 2023-07-10 DIAGNOSIS — M9701XD Periprosthetic fracture around internal prosthetic right hip joint, subsequent encounter: Secondary | ICD-10-CM | POA: Diagnosis not present

## 2023-07-10 DIAGNOSIS — Z85818 Personal history of malignant neoplasm of other sites of lip, oral cavity, and pharynx: Secondary | ICD-10-CM | POA: Diagnosis not present

## 2023-07-10 DIAGNOSIS — M150 Primary generalized (osteo)arthritis: Secondary | ICD-10-CM | POA: Diagnosis not present

## 2023-07-10 DIAGNOSIS — R6521 Severe sepsis with septic shock: Secondary | ICD-10-CM | POA: Diagnosis not present

## 2023-07-10 DIAGNOSIS — E441 Mild protein-calorie malnutrition: Secondary | ICD-10-CM | POA: Diagnosis not present

## 2023-07-10 DIAGNOSIS — G928 Other toxic encephalopathy: Secondary | ICD-10-CM | POA: Diagnosis not present

## 2023-07-10 DIAGNOSIS — Z9981 Dependence on supplemental oxygen: Secondary | ICD-10-CM | POA: Diagnosis not present

## 2023-07-10 DIAGNOSIS — N390 Urinary tract infection, site not specified: Secondary | ICD-10-CM | POA: Diagnosis not present

## 2023-07-10 DIAGNOSIS — A4159 Other Gram-negative sepsis: Secondary | ICD-10-CM | POA: Diagnosis not present

## 2023-07-10 DIAGNOSIS — S72121D Displaced fracture of lesser trochanter of right femur, subsequent encounter for closed fracture with routine healing: Secondary | ICD-10-CM | POA: Diagnosis not present

## 2023-07-10 DIAGNOSIS — E1142 Type 2 diabetes mellitus with diabetic polyneuropathy: Secondary | ICD-10-CM | POA: Diagnosis not present

## 2023-07-10 DIAGNOSIS — J9622 Acute and chronic respiratory failure with hypercapnia: Secondary | ICD-10-CM | POA: Diagnosis not present

## 2023-07-13 DIAGNOSIS — S72121D Displaced fracture of lesser trochanter of right femur, subsequent encounter for closed fracture with routine healing: Secondary | ICD-10-CM | POA: Diagnosis not present

## 2023-07-13 DIAGNOSIS — A4159 Other Gram-negative sepsis: Secondary | ICD-10-CM | POA: Diagnosis not present

## 2023-07-13 DIAGNOSIS — Z556 Problems related to health literacy: Secondary | ICD-10-CM | POA: Diagnosis not present

## 2023-07-13 DIAGNOSIS — F329 Major depressive disorder, single episode, unspecified: Secondary | ICD-10-CM | POA: Diagnosis not present

## 2023-07-13 DIAGNOSIS — N179 Acute kidney failure, unspecified: Secondary | ICD-10-CM | POA: Diagnosis not present

## 2023-07-13 DIAGNOSIS — M150 Primary generalized (osteo)arthritis: Secondary | ICD-10-CM | POA: Diagnosis not present

## 2023-07-13 DIAGNOSIS — G928 Other toxic encephalopathy: Secondary | ICD-10-CM | POA: Diagnosis not present

## 2023-07-13 DIAGNOSIS — Z923 Personal history of irradiation: Secondary | ICD-10-CM | POA: Diagnosis not present

## 2023-07-13 DIAGNOSIS — R6521 Severe sepsis with septic shock: Secondary | ICD-10-CM | POA: Diagnosis not present

## 2023-07-13 DIAGNOSIS — R296 Repeated falls: Secondary | ICD-10-CM | POA: Diagnosis not present

## 2023-07-13 DIAGNOSIS — E785 Hyperlipidemia, unspecified: Secondary | ICD-10-CM | POA: Diagnosis not present

## 2023-07-13 DIAGNOSIS — K5900 Constipation, unspecified: Secondary | ICD-10-CM | POA: Diagnosis not present

## 2023-07-13 DIAGNOSIS — M797 Fibromyalgia: Secondary | ICD-10-CM | POA: Diagnosis not present

## 2023-07-13 DIAGNOSIS — G3184 Mild cognitive impairment, so stated: Secondary | ICD-10-CM | POA: Diagnosis not present

## 2023-07-13 DIAGNOSIS — Z85818 Personal history of malignant neoplasm of other sites of lip, oral cavity, and pharynx: Secondary | ICD-10-CM | POA: Diagnosis not present

## 2023-07-13 DIAGNOSIS — M9701XD Periprosthetic fracture around internal prosthetic right hip joint, subsequent encounter: Secondary | ICD-10-CM | POA: Diagnosis not present

## 2023-07-13 DIAGNOSIS — J9622 Acute and chronic respiratory failure with hypercapnia: Secondary | ICD-10-CM | POA: Diagnosis not present

## 2023-07-13 DIAGNOSIS — Z96641 Presence of right artificial hip joint: Secondary | ICD-10-CM | POA: Diagnosis not present

## 2023-07-13 DIAGNOSIS — F419 Anxiety disorder, unspecified: Secondary | ICD-10-CM | POA: Diagnosis not present

## 2023-07-13 DIAGNOSIS — Z9981 Dependence on supplemental oxygen: Secondary | ICD-10-CM | POA: Diagnosis not present

## 2023-07-13 DIAGNOSIS — E441 Mild protein-calorie malnutrition: Secondary | ICD-10-CM | POA: Diagnosis not present

## 2023-07-13 DIAGNOSIS — Z85118 Personal history of other malignant neoplasm of bronchus and lung: Secondary | ICD-10-CM | POA: Diagnosis not present

## 2023-07-13 DIAGNOSIS — N390 Urinary tract infection, site not specified: Secondary | ICD-10-CM | POA: Diagnosis not present

## 2023-07-13 DIAGNOSIS — E1142 Type 2 diabetes mellitus with diabetic polyneuropathy: Secondary | ICD-10-CM | POA: Diagnosis not present

## 2023-07-15 DIAGNOSIS — A4159 Other Gram-negative sepsis: Secondary | ICD-10-CM | POA: Diagnosis not present

## 2023-07-15 DIAGNOSIS — Z9981 Dependence on supplemental oxygen: Secondary | ICD-10-CM | POA: Diagnosis not present

## 2023-07-15 DIAGNOSIS — G928 Other toxic encephalopathy: Secondary | ICD-10-CM | POA: Diagnosis not present

## 2023-07-15 DIAGNOSIS — S72121D Displaced fracture of lesser trochanter of right femur, subsequent encounter for closed fracture with routine healing: Secondary | ICD-10-CM | POA: Diagnosis not present

## 2023-07-15 DIAGNOSIS — Z85118 Personal history of other malignant neoplasm of bronchus and lung: Secondary | ICD-10-CM | POA: Diagnosis not present

## 2023-07-15 DIAGNOSIS — M150 Primary generalized (osteo)arthritis: Secondary | ICD-10-CM | POA: Diagnosis not present

## 2023-07-15 DIAGNOSIS — G3184 Mild cognitive impairment, so stated: Secondary | ICD-10-CM | POA: Diagnosis not present

## 2023-07-15 DIAGNOSIS — M797 Fibromyalgia: Secondary | ICD-10-CM | POA: Diagnosis not present

## 2023-07-15 DIAGNOSIS — Z85818 Personal history of malignant neoplasm of other sites of lip, oral cavity, and pharynx: Secondary | ICD-10-CM | POA: Diagnosis not present

## 2023-07-15 DIAGNOSIS — E1142 Type 2 diabetes mellitus with diabetic polyneuropathy: Secondary | ICD-10-CM | POA: Diagnosis not present

## 2023-07-15 DIAGNOSIS — F419 Anxiety disorder, unspecified: Secondary | ICD-10-CM | POA: Diagnosis not present

## 2023-07-15 DIAGNOSIS — N179 Acute kidney failure, unspecified: Secondary | ICD-10-CM | POA: Diagnosis not present

## 2023-07-15 DIAGNOSIS — E441 Mild protein-calorie malnutrition: Secondary | ICD-10-CM | POA: Diagnosis not present

## 2023-07-15 DIAGNOSIS — Z556 Problems related to health literacy: Secondary | ICD-10-CM | POA: Diagnosis not present

## 2023-07-15 DIAGNOSIS — N390 Urinary tract infection, site not specified: Secondary | ICD-10-CM | POA: Diagnosis not present

## 2023-07-15 DIAGNOSIS — K5900 Constipation, unspecified: Secondary | ICD-10-CM | POA: Diagnosis not present

## 2023-07-15 DIAGNOSIS — J9622 Acute and chronic respiratory failure with hypercapnia: Secondary | ICD-10-CM | POA: Diagnosis not present

## 2023-07-15 DIAGNOSIS — M9701XD Periprosthetic fracture around internal prosthetic right hip joint, subsequent encounter: Secondary | ICD-10-CM | POA: Diagnosis not present

## 2023-07-15 DIAGNOSIS — R296 Repeated falls: Secondary | ICD-10-CM | POA: Diagnosis not present

## 2023-07-15 DIAGNOSIS — R6521 Severe sepsis with septic shock: Secondary | ICD-10-CM | POA: Diagnosis not present

## 2023-07-15 DIAGNOSIS — Z923 Personal history of irradiation: Secondary | ICD-10-CM | POA: Diagnosis not present

## 2023-07-15 DIAGNOSIS — F329 Major depressive disorder, single episode, unspecified: Secondary | ICD-10-CM | POA: Diagnosis not present

## 2023-07-15 DIAGNOSIS — Z96641 Presence of right artificial hip joint: Secondary | ICD-10-CM | POA: Diagnosis not present

## 2023-07-15 DIAGNOSIS — E785 Hyperlipidemia, unspecified: Secondary | ICD-10-CM | POA: Diagnosis not present

## 2023-07-22 DIAGNOSIS — N179 Acute kidney failure, unspecified: Secondary | ICD-10-CM | POA: Diagnosis not present

## 2023-07-22 DIAGNOSIS — E1142 Type 2 diabetes mellitus with diabetic polyneuropathy: Secondary | ICD-10-CM | POA: Diagnosis not present

## 2023-07-22 DIAGNOSIS — Z96641 Presence of right artificial hip joint: Secondary | ICD-10-CM | POA: Diagnosis not present

## 2023-07-22 DIAGNOSIS — N390 Urinary tract infection, site not specified: Secondary | ICD-10-CM | POA: Diagnosis not present

## 2023-07-22 DIAGNOSIS — F419 Anxiety disorder, unspecified: Secondary | ICD-10-CM | POA: Diagnosis not present

## 2023-07-22 DIAGNOSIS — M797 Fibromyalgia: Secondary | ICD-10-CM | POA: Diagnosis not present

## 2023-07-22 DIAGNOSIS — J9622 Acute and chronic respiratory failure with hypercapnia: Secondary | ICD-10-CM | POA: Diagnosis not present

## 2023-07-22 DIAGNOSIS — Z85118 Personal history of other malignant neoplasm of bronchus and lung: Secondary | ICD-10-CM | POA: Diagnosis not present

## 2023-07-22 DIAGNOSIS — Z556 Problems related to health literacy: Secondary | ICD-10-CM | POA: Diagnosis not present

## 2023-07-22 DIAGNOSIS — G3184 Mild cognitive impairment, so stated: Secondary | ICD-10-CM | POA: Diagnosis not present

## 2023-07-22 DIAGNOSIS — M9701XD Periprosthetic fracture around internal prosthetic right hip joint, subsequent encounter: Secondary | ICD-10-CM | POA: Diagnosis not present

## 2023-07-22 DIAGNOSIS — M150 Primary generalized (osteo)arthritis: Secondary | ICD-10-CM | POA: Diagnosis not present

## 2023-07-22 DIAGNOSIS — S72121D Displaced fracture of lesser trochanter of right femur, subsequent encounter for closed fracture with routine healing: Secondary | ICD-10-CM | POA: Diagnosis not present

## 2023-07-22 DIAGNOSIS — Z85818 Personal history of malignant neoplasm of other sites of lip, oral cavity, and pharynx: Secondary | ICD-10-CM | POA: Diagnosis not present

## 2023-07-22 DIAGNOSIS — K5900 Constipation, unspecified: Secondary | ICD-10-CM | POA: Diagnosis not present

## 2023-07-22 DIAGNOSIS — Z923 Personal history of irradiation: Secondary | ICD-10-CM | POA: Diagnosis not present

## 2023-07-22 DIAGNOSIS — R6521 Severe sepsis with septic shock: Secondary | ICD-10-CM | POA: Diagnosis not present

## 2023-07-22 DIAGNOSIS — F329 Major depressive disorder, single episode, unspecified: Secondary | ICD-10-CM | POA: Diagnosis not present

## 2023-07-22 DIAGNOSIS — E785 Hyperlipidemia, unspecified: Secondary | ICD-10-CM | POA: Diagnosis not present

## 2023-07-22 DIAGNOSIS — Z9981 Dependence on supplemental oxygen: Secondary | ICD-10-CM | POA: Diagnosis not present

## 2023-07-22 DIAGNOSIS — G928 Other toxic encephalopathy: Secondary | ICD-10-CM | POA: Diagnosis not present

## 2023-07-22 DIAGNOSIS — R296 Repeated falls: Secondary | ICD-10-CM | POA: Diagnosis not present

## 2023-07-22 DIAGNOSIS — E441 Mild protein-calorie malnutrition: Secondary | ICD-10-CM | POA: Diagnosis not present

## 2023-07-22 DIAGNOSIS — A4159 Other Gram-negative sepsis: Secondary | ICD-10-CM | POA: Diagnosis not present

## 2023-07-28 DIAGNOSIS — Z85118 Personal history of other malignant neoplasm of bronchus and lung: Secondary | ICD-10-CM | POA: Diagnosis not present

## 2023-07-28 DIAGNOSIS — S72121D Displaced fracture of lesser trochanter of right femur, subsequent encounter for closed fracture with routine healing: Secondary | ICD-10-CM | POA: Diagnosis not present

## 2023-07-28 DIAGNOSIS — M150 Primary generalized (osteo)arthritis: Secondary | ICD-10-CM | POA: Diagnosis not present

## 2023-07-28 DIAGNOSIS — M9701XD Periprosthetic fracture around internal prosthetic right hip joint, subsequent encounter: Secondary | ICD-10-CM | POA: Diagnosis not present

## 2023-07-28 DIAGNOSIS — N179 Acute kidney failure, unspecified: Secondary | ICD-10-CM | POA: Diagnosis not present

## 2023-07-28 DIAGNOSIS — E441 Mild protein-calorie malnutrition: Secondary | ICD-10-CM | POA: Diagnosis not present

## 2023-07-28 DIAGNOSIS — G3184 Mild cognitive impairment, so stated: Secondary | ICD-10-CM | POA: Diagnosis not present

## 2023-07-28 DIAGNOSIS — Z923 Personal history of irradiation: Secondary | ICD-10-CM | POA: Diagnosis not present

## 2023-07-28 DIAGNOSIS — R296 Repeated falls: Secondary | ICD-10-CM | POA: Diagnosis not present

## 2023-07-28 DIAGNOSIS — A4159 Other Gram-negative sepsis: Secondary | ICD-10-CM | POA: Diagnosis not present

## 2023-07-28 DIAGNOSIS — F419 Anxiety disorder, unspecified: Secondary | ICD-10-CM | POA: Diagnosis not present

## 2023-07-28 DIAGNOSIS — Z96641 Presence of right artificial hip joint: Secondary | ICD-10-CM | POA: Diagnosis not present

## 2023-07-28 DIAGNOSIS — J9622 Acute and chronic respiratory failure with hypercapnia: Secondary | ICD-10-CM | POA: Diagnosis not present

## 2023-07-28 DIAGNOSIS — M797 Fibromyalgia: Secondary | ICD-10-CM | POA: Diagnosis not present

## 2023-07-28 DIAGNOSIS — R6521 Severe sepsis with septic shock: Secondary | ICD-10-CM | POA: Diagnosis not present

## 2023-07-28 DIAGNOSIS — F329 Major depressive disorder, single episode, unspecified: Secondary | ICD-10-CM | POA: Diagnosis not present

## 2023-07-28 DIAGNOSIS — E785 Hyperlipidemia, unspecified: Secondary | ICD-10-CM | POA: Diagnosis not present

## 2023-07-28 DIAGNOSIS — K5900 Constipation, unspecified: Secondary | ICD-10-CM | POA: Diagnosis not present

## 2023-07-28 DIAGNOSIS — E1142 Type 2 diabetes mellitus with diabetic polyneuropathy: Secondary | ICD-10-CM | POA: Diagnosis not present

## 2023-07-28 DIAGNOSIS — G928 Other toxic encephalopathy: Secondary | ICD-10-CM | POA: Diagnosis not present

## 2023-07-28 DIAGNOSIS — Z9981 Dependence on supplemental oxygen: Secondary | ICD-10-CM | POA: Diagnosis not present

## 2023-07-28 DIAGNOSIS — Z556 Problems related to health literacy: Secondary | ICD-10-CM | POA: Diagnosis not present

## 2023-07-28 DIAGNOSIS — Z85818 Personal history of malignant neoplasm of other sites of lip, oral cavity, and pharynx: Secondary | ICD-10-CM | POA: Diagnosis not present

## 2023-07-28 DIAGNOSIS — N390 Urinary tract infection, site not specified: Secondary | ICD-10-CM | POA: Diagnosis not present

## 2023-07-29 DIAGNOSIS — K5900 Constipation, unspecified: Secondary | ICD-10-CM | POA: Diagnosis not present

## 2023-07-29 DIAGNOSIS — E441 Mild protein-calorie malnutrition: Secondary | ICD-10-CM | POA: Diagnosis not present

## 2023-07-29 DIAGNOSIS — Z556 Problems related to health literacy: Secondary | ICD-10-CM | POA: Diagnosis not present

## 2023-07-29 DIAGNOSIS — Z96641 Presence of right artificial hip joint: Secondary | ICD-10-CM | POA: Diagnosis not present

## 2023-07-29 DIAGNOSIS — N390 Urinary tract infection, site not specified: Secondary | ICD-10-CM | POA: Diagnosis not present

## 2023-07-29 DIAGNOSIS — G3184 Mild cognitive impairment, so stated: Secondary | ICD-10-CM | POA: Diagnosis not present

## 2023-07-29 DIAGNOSIS — M9701XD Periprosthetic fracture around internal prosthetic right hip joint, subsequent encounter: Secondary | ICD-10-CM | POA: Diagnosis not present

## 2023-07-29 DIAGNOSIS — A4159 Other Gram-negative sepsis: Secondary | ICD-10-CM | POA: Diagnosis not present

## 2023-07-29 DIAGNOSIS — R296 Repeated falls: Secondary | ICD-10-CM | POA: Diagnosis not present

## 2023-07-29 DIAGNOSIS — E785 Hyperlipidemia, unspecified: Secondary | ICD-10-CM | POA: Diagnosis not present

## 2023-07-29 DIAGNOSIS — S72121D Displaced fracture of lesser trochanter of right femur, subsequent encounter for closed fracture with routine healing: Secondary | ICD-10-CM | POA: Diagnosis not present

## 2023-07-29 DIAGNOSIS — M797 Fibromyalgia: Secondary | ICD-10-CM | POA: Diagnosis not present

## 2023-07-29 DIAGNOSIS — N179 Acute kidney failure, unspecified: Secondary | ICD-10-CM | POA: Diagnosis not present

## 2023-07-29 DIAGNOSIS — R6521 Severe sepsis with septic shock: Secondary | ICD-10-CM | POA: Diagnosis not present

## 2023-07-29 DIAGNOSIS — Z85118 Personal history of other malignant neoplasm of bronchus and lung: Secondary | ICD-10-CM | POA: Diagnosis not present

## 2023-07-29 DIAGNOSIS — F329 Major depressive disorder, single episode, unspecified: Secondary | ICD-10-CM | POA: Diagnosis not present

## 2023-07-29 DIAGNOSIS — Z9981 Dependence on supplemental oxygen: Secondary | ICD-10-CM | POA: Diagnosis not present

## 2023-07-29 DIAGNOSIS — Z85818 Personal history of malignant neoplasm of other sites of lip, oral cavity, and pharynx: Secondary | ICD-10-CM | POA: Diagnosis not present

## 2023-07-29 DIAGNOSIS — G928 Other toxic encephalopathy: Secondary | ICD-10-CM | POA: Diagnosis not present

## 2023-07-29 DIAGNOSIS — Z923 Personal history of irradiation: Secondary | ICD-10-CM | POA: Diagnosis not present

## 2023-07-29 DIAGNOSIS — J9622 Acute and chronic respiratory failure with hypercapnia: Secondary | ICD-10-CM | POA: Diagnosis not present

## 2023-07-29 DIAGNOSIS — F419 Anxiety disorder, unspecified: Secondary | ICD-10-CM | POA: Diagnosis not present

## 2023-07-29 DIAGNOSIS — M150 Primary generalized (osteo)arthritis: Secondary | ICD-10-CM | POA: Diagnosis not present

## 2023-07-29 DIAGNOSIS — E1142 Type 2 diabetes mellitus with diabetic polyneuropathy: Secondary | ICD-10-CM | POA: Diagnosis not present

## 2023-08-01 DIAGNOSIS — C098 Malignant neoplasm of overlapping sites of tonsil: Secondary | ICD-10-CM | POA: Diagnosis not present

## 2023-08-01 DIAGNOSIS — J9612 Chronic respiratory failure with hypercapnia: Secondary | ICD-10-CM | POA: Diagnosis not present

## 2023-08-01 DIAGNOSIS — J99 Respiratory disorders in diseases classified elsewhere: Secondary | ICD-10-CM | POA: Diagnosis not present

## 2023-08-01 DIAGNOSIS — C7800 Secondary malignant neoplasm of unspecified lung: Secondary | ICD-10-CM | POA: Diagnosis not present

## 2023-08-29 DIAGNOSIS — R32 Unspecified urinary incontinence: Secondary | ICD-10-CM | POA: Diagnosis not present

## 2023-08-29 DIAGNOSIS — N400 Enlarged prostate without lower urinary tract symptoms: Secondary | ICD-10-CM | POA: Diagnosis not present

## 2023-09-01 DIAGNOSIS — C098 Malignant neoplasm of overlapping sites of tonsil: Secondary | ICD-10-CM | POA: Diagnosis not present

## 2023-09-01 DIAGNOSIS — J9612 Chronic respiratory failure with hypercapnia: Secondary | ICD-10-CM | POA: Diagnosis not present

## 2023-09-01 DIAGNOSIS — J99 Respiratory disorders in diseases classified elsewhere: Secondary | ICD-10-CM | POA: Diagnosis not present

## 2023-09-01 DIAGNOSIS — C7800 Secondary malignant neoplasm of unspecified lung: Secondary | ICD-10-CM | POA: Diagnosis not present

## 2023-09-09 DIAGNOSIS — N390 Urinary tract infection, site not specified: Secondary | ICD-10-CM | POA: Diagnosis not present

## 2023-09-09 DIAGNOSIS — F419 Anxiety disorder, unspecified: Secondary | ICD-10-CM | POA: Diagnosis not present

## 2023-09-09 DIAGNOSIS — G894 Chronic pain syndrome: Secondary | ICD-10-CM | POA: Diagnosis not present

## 2023-09-09 DIAGNOSIS — S3282XS Multiple fractures of pelvis without disruption of pelvic ring, sequela: Secondary | ICD-10-CM | POA: Diagnosis not present

## 2023-09-09 DIAGNOSIS — Z6820 Body mass index (BMI) 20.0-20.9, adult: Secondary | ICD-10-CM | POA: Diagnosis not present

## 2023-11-25 ENCOUNTER — Ambulatory Visit: Admitting: Urology

## 2023-11-29 ENCOUNTER — Other Ambulatory Visit: Payer: Self-pay

## 2023-11-29 ENCOUNTER — Encounter (HOSPITAL_COMMUNITY): Payer: Self-pay | Admitting: Internal Medicine

## 2023-11-29 ENCOUNTER — Emergency Department (HOSPITAL_COMMUNITY)

## 2023-11-29 ENCOUNTER — Inpatient Hospital Stay (HOSPITAL_COMMUNITY)

## 2023-11-29 ENCOUNTER — Observation Stay (HOSPITAL_COMMUNITY)
Admission: EM | Admit: 2023-11-29 | Discharge: 2023-12-02 | Disposition: A | Discharge status: Home / Self Care | Attending: Internal Medicine | Admitting: Internal Medicine

## 2023-11-29 DIAGNOSIS — C3490 Malignant neoplasm of unspecified part of unspecified bronchus or lung: Secondary | ICD-10-CM | POA: Diagnosis not present

## 2023-11-29 DIAGNOSIS — R627 Adult failure to thrive: Secondary | ICD-10-CM | POA: Diagnosis not present

## 2023-11-29 DIAGNOSIS — C7989 Secondary malignant neoplasm of other specified sites: Secondary | ICD-10-CM | POA: Diagnosis not present

## 2023-11-29 DIAGNOSIS — N390 Urinary tract infection, site not specified: Principal | ICD-10-CM | POA: Diagnosis present

## 2023-11-29 DIAGNOSIS — Z23 Encounter for immunization: Secondary | ICD-10-CM | POA: Insufficient documentation

## 2023-11-29 DIAGNOSIS — E782 Mixed hyperlipidemia: Secondary | ICD-10-CM | POA: Insufficient documentation

## 2023-11-29 DIAGNOSIS — G2581 Restless legs syndrome: Secondary | ICD-10-CM | POA: Diagnosis not present

## 2023-11-29 DIAGNOSIS — R8290 Unspecified abnormal findings in urine: Secondary | ICD-10-CM | POA: Insufficient documentation

## 2023-11-29 DIAGNOSIS — C099 Malignant neoplasm of tonsil, unspecified: Secondary | ICD-10-CM | POA: Insufficient documentation

## 2023-11-29 DIAGNOSIS — E1140 Type 2 diabetes mellitus with diabetic neuropathy, unspecified: Secondary | ICD-10-CM | POA: Insufficient documentation

## 2023-11-29 DIAGNOSIS — F419 Anxiety disorder, unspecified: Secondary | ICD-10-CM

## 2023-11-29 DIAGNOSIS — R1310 Dysphagia, unspecified: Secondary | ICD-10-CM | POA: Insufficient documentation

## 2023-11-29 DIAGNOSIS — R6251 Failure to thrive (child): Secondary | ICD-10-CM

## 2023-11-29 DIAGNOSIS — R531 Weakness: Secondary | ICD-10-CM | POA: Insufficient documentation

## 2023-11-29 DIAGNOSIS — R112 Nausea with vomiting, unspecified: Secondary | ICD-10-CM | POA: Diagnosis not present

## 2023-11-29 DIAGNOSIS — R110 Nausea: Secondary | ICD-10-CM | POA: Diagnosis present

## 2023-11-29 DIAGNOSIS — Z87898 Personal history of other specified conditions: Secondary | ICD-10-CM

## 2023-11-29 DIAGNOSIS — R319 Hematuria, unspecified: Secondary | ICD-10-CM

## 2023-11-29 DIAGNOSIS — Z1612 Extended spectrum beta lactamase (ESBL) resistance: Secondary | ICD-10-CM | POA: Insufficient documentation

## 2023-11-29 LAB — URINALYSIS, ROUTINE W REFLEX MICROSCOPIC
Bilirubin Urine: NEGATIVE
Glucose, UA: NEGATIVE mg/dL
Ketones, ur: NEGATIVE mg/dL
Nitrite: POSITIVE — AB
Protein, ur: 100 mg/dL — AB
RBC / HPF: >50 RBC/hpf (ref 0–5)
Specific Gravity, Urine: 1.02 (ref 1.005–1.030)
WBC, UA: >50 WBC/hpf (ref 0–5)
pH: 5 (ref 5.0–8.0)

## 2023-11-29 LAB — COMPREHENSIVE METABOLIC PANEL WITH GFR
ALT: 15 U/L (ref 0–44)
AST: 27 U/L (ref 15–41)
Albumin: 4.1 g/dL (ref 3.5–5.0)
Alkaline Phosphatase: 83 U/L (ref 38–126)
Anion gap: 7 (ref 5–15)
BUN: 29 mg/dL — ABNORMAL HIGH (ref 8–23)
CO2: 33 mmol/L — ABNORMAL HIGH (ref 22–32)
Calcium: 9.5 mg/dL (ref 8.9–10.3)
Chloride: 100 mmol/L (ref 98–111)
Creatinine, Ser: 1.14 mg/dL (ref 0.61–1.24)
GFR, Estimated: >60 mL/min (ref >60)
Glucose, Bld: 95 mg/dL (ref 70–99)
Potassium: 4.9 mmol/L (ref 3.5–5.1)
Sodium: 139 mmol/L (ref 135–145)
Total Bilirubin: 0.5 mg/dL (ref 0.0–1.2)
Total Protein: 7.2 g/dL (ref 6.5–8.1)

## 2023-11-29 LAB — CBC
HCT: 38 % — ABNORMAL LOW (ref 39.0–52.0)
Hemoglobin: 12.2 g/dL — ABNORMAL LOW (ref 13.0–17.0)
MCH: 30.4 pg (ref 26.0–34.0)
MCHC: 32.1 g/dL (ref 30.0–36.0)
MCV: 94.8 fL (ref 80.0–100.0)
Platelets: 185 K/uL (ref 150–400)
RBC: 4.01 MIL/uL — ABNORMAL LOW (ref 4.22–5.81)
RDW: 15 % (ref 11.5–15.5)
WBC: 6.5 K/uL (ref 4.0–10.5)
nRBC: 0 % (ref 0.0–0.2)

## 2023-11-29 LAB — LACTIC ACID, PLASMA: Lactic Acid, Venous: 0.9 mmol/L (ref 0.5–1.9)

## 2023-11-29 LAB — LIPASE, BLOOD: Lipase: 102 U/L — ABNORMAL HIGH (ref 11–51)

## 2023-11-29 MED ORDER — ONDANSETRON HCL 4 MG/2ML IJ SOLN
4.0000 mg | Freq: Once | INTRAMUSCULAR | Status: AC
  Administered 2023-11-29: 4 mg via INTRAVENOUS
  Filled 2023-11-29: qty 2

## 2023-11-29 MED ORDER — INFLUENZA VAC SPLIT HIGH-DOSE 0.5 ML IM SUSY
0.5000 mL | PREFILLED_SYRINGE | INTRAMUSCULAR | Status: AC
  Administered 2023-11-30: 0.5 mL via INTRAMUSCULAR
  Filled 2023-11-29: qty 0.5

## 2023-11-29 MED ORDER — IOHEXOL 300 MG/ML  SOLN
100.0000 mL | Freq: Once | INTRAMUSCULAR | Status: AC | PRN
  Administered 2023-11-29: 100 mL via INTRAVENOUS

## 2023-11-29 MED ORDER — ONDANSETRON HCL 4 MG PO TABS: 4.0000 mg | ORAL_TABLET | Freq: Four times a day (QID) | ORAL | Status: DC | PRN

## 2023-11-29 MED ORDER — ENOXAPARIN SODIUM 40 MG/0.4ML IJ SOSY
40.0000 mg | PREFILLED_SYRINGE | INTRAMUSCULAR | Status: DC
  Administered 2023-11-30 – 2023-12-02 (×3): 40 mg via SUBCUTANEOUS
  Filled 2023-11-29 (×3): qty 0.4

## 2023-11-29 MED ORDER — ONDANSETRON HCL 4 MG/2ML IJ SOLN: 4.0000 mg | Freq: Four times a day (QID) | INTRAMUSCULAR | Status: DC | PRN

## 2023-11-29 MED ORDER — SODIUM CHLORIDE 0.9 % IV BOLUS
1000.0000 mL | Freq: Once | INTRAVENOUS | Status: AC
  Administered 2023-11-29: 1000 mL via INTRAVENOUS

## 2023-11-29 MED ORDER — PNEUMOCOCCAL 20-VAL CONJ VACC 0.5 ML IM SUSY
0.5000 mL | PREFILLED_SYRINGE | INTRAMUSCULAR | Status: AC
  Administered 2023-12-02: 0.5 mL via INTRAMUSCULAR
  Filled 2023-11-29: qty 0.5

## 2023-11-29 MED ORDER — SODIUM CHLORIDE 0.9 % IV SOLN
2.0000 g | Freq: Once | INTRAVENOUS | Status: AC
  Administered 2023-11-29: 2 g via INTRAVENOUS
  Filled 2023-11-29: qty 20

## 2023-11-29 MED ORDER — ACETAMINOPHEN 650 MG RE SUPP: 650.0000 mg | Freq: Four times a day (QID) | RECTAL | Status: DC | PRN

## 2023-11-29 MED ORDER — ENSURE PLUS HIGH PROTEIN PO LIQD
237.0000 mL | Freq: Two times a day (BID) | ORAL | Status: DC
  Administered 2023-11-30 – 2023-12-02 (×4): 237 mL via ORAL

## 2023-11-29 MED ORDER — ACETAMINOPHEN 325 MG PO TABS: 650.0000 mg | ORAL_TABLET | Freq: Four times a day (QID) | ORAL | Status: DC | PRN

## 2023-11-29 MED ORDER — SODIUM CHLORIDE 0.9 % IV SOLN
1.0000 g | INTRAVENOUS | Status: DC
  Administered 2023-11-30 – 2023-12-01 (×2): 1 g via INTRAVENOUS
  Filled 2023-11-29 (×2): qty 10

## 2023-11-29 NOTE — H&P (Addendum)
 History and Physical    Patient: [name] FMW:[id number] DOB: 1951/01/03 DOA: 11/29/2023 DOS: the patient was seen and examined on 11/29/2023 PCP: Marvine Rush, MD  Patient coming from: Home  Chief Complaint:  Chief Complaint  Patient presents with   Nausea   Emesis   HPI: [name] is a 73 y.o. 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  Patient is currently on Macrobid due to chronic UTIs  ED course In the emergency department, BP was 126/59 and other vital signs were within normal range.  Workup in ED showed normocytic anemia, normal BMP except for bicarb of 23 and BUN of 29.  Lipase 102, lactic acid 0.9, urinalysis was suggestive of UTI.  Blood culture pending CT abdomen and pelvis showed numerous gallstones, numerous large calcified bladder stones, small hiatal hernia and bibasilar interstitial prominence and nodularity suggesting atypical infectious process or inflammation CT chest without contrast was suggestive of interstitial lung disease or inflammatory process.  Subsolid 5 mm RLL pulmonary nodule noted. Chest x-ray showed no acute cardiopulmonary process CT head shows no acute intracranial abnormality Patient was treated with IV ceftriaxone , Zofran  was given and IV hydration was provided.     Review of Systems: As mentioned in the history of present illness. All other systems reviewed and are negative. Past Medical History:  Diagnosis Date   Allergy    Anxiety    Arthritis    knees, HIps, Hands   Chest pain    10/14   Complication of anesthesia    bleeding  during intubation 04/04/13 due to friability of right tonsillar cancer   Constipation    Depression    Fibromyalgia    Lung cancer (HCC)    Neuropathy    compression neuropathy right hip;s/p replacement   PEG (percutaneous endoscopic gastrostomy) status (HCC)    Pneumonia 01/13/2010   S/P radiation therapy 05/02/2013-06/22/2013   70 Gray - Squamous Cell Carcinoma of the tonsil, p16+, T3N2cM0   Tonsillar cancer (HCC)    Type II diabetes mellitus (HCC)    Urethral stricture    s/p dilitation   Past Surgical History:  Procedure Laterality Date   CYSTOSCOPY     Dr. Ottelin   GASTROSTOMY TUBE PLACEMENT  04/04/2013   LAPAROSCOPIC GASTROSTOMY N/A 04/04/2013   Procedure: LAPAROSCOPIC GASTROSTOMY TUBE PLACEMENT ;  Surgeon: Lynda Leos, MD;  Location: [facility] OR;  Service: General;  Laterality: N/A;   MULTIPLE EXTRACTIONS WITH ALVEOLOPLASTY N/A 04/14/2013   Procedure: Extraction of tooth #'s 1,2,3,4,5,6,7,8,9,10,11,12,13,14,15,17,18,19,20,21,22,23,24,25,26,27,28,29, 30, 31, and 32 with alveoloplasty and bilateral mandibular tori reductions.;  Surgeon: Tanda JULIANNA Fanny, DDS;  Location: [facility] OR;  Service: Oral Surgery;  Laterality: N/A;   NASAL HEMORRHAGE CONTROL N/A 04/04/2013   Procedure: Control of oropharyngeal hemorrhage;  Surgeon: Ana LELON Moccasin, MD;  Location: [facility] OR;  Service: ENT;  Laterality: N/A;   PORTACATH PLACEMENT Right 04/04/2013   PORTACATH PLACEMENT N/A 04/04/2013   Procedure: INSERTION PORT-A-CATH;  Surgeon: Lynda Leos, MD;  Location: [facility] OR;  Service: General;  Laterality: N/A;   TOTAL HIP ARTHROPLASTY Right 2000   Dr. Beverley   Social History:  reports that he has never smoked. He has never used  smokeless tobacco. He reports that he does not currently use alcohol. He reports that he does not use drugs.  Allergies  Allergen Reactions   Neurontin [Gabapentin] Other (See Comments)    Causes seizures   Buspirone Other (See Comments)    Lips swelling    Dilaudid  [Hydromorphone  Hcl]  Other (See Comments)    Oxygen  saturation drops    Family History  Problem Relation Age of Onset   Cancer Cousin        living brain cancer, male   Cancer Mother        Deceased with leukemia, had uterine ca   Hyperlipidemia Brother     Prior to Admission medications   Medication Sig Start Date End Date Taking? Authorizing Provider  acetaminophen  (TYLENOL ) 500 MG tablet Take 2 tablets (1,000 mg total) by mouth every 6 (six) hours as needed. Patient taking differently: Take 1,000 mg by mouth every 6 (six) hours as needed for mild pain (pain score 1-3). 03/30/23  Yes Tammy Sor, PA-C  ALPRAZolam  (XANAX ) 0.5 MG tablet Take 1 tablet (0.5 mg total) by mouth 3 (three) times daily. 05/18/23  Yes Emokpae, Courage, MD  citalopram  (CELEXA ) 40 MG tablet Take 1 tablet (40 mg total) by mouth daily. 05/18/23  Yes Pearlean Manus, MD  OXYGEN  Inhale 2 L into the lungs continuous.   Yes [provider]  rOPINIRole  (REQUIP ) 3 MG tablet Take 3 mg by mouth at bedtime. 05/14/23  Yes [provider]  rosuvastatin  (CRESTOR ) 10 MG tablet Take 10 mg by mouth daily. 03/15/23  Yes [provider]  traMADol  (ULTRAM ) 50 MG tablet Take 1 tablet (50 mg total) by mouth every 8 (eight) hours as needed (moderate to severe pain). 05/18/23  Yes Pearlean Manus, MD  albuterol  (PROVENTIL ) (2.5 MG/3ML) 0.083% nebulizer solution Take 3 mLs (2.5 mg total) by nebulization every 4 (four) hours as needed for wheezing or shortness of breath. 05/18/23   Pearlean Manus, MD  ALPRAZolam  (XANAX ) 1 MG tablet Take 1 tablet (1 mg total) by mouth at bedtime. Patient not taking: Reported on 06/09/2023 05/18/23   Pearlean Manus, MD  alprazolam  (XANAX ) 2 MG tablet Take 2 mg by mouth at bedtime.    [provider]  levofloxacin  (LEVAQUIN ) 750 MG tablet Take 1 tablet (750 mg total) by mouth daily. 06/09/23   Ula Prentice SAUNDERS, MD    Physical Exam: Vitals:   11/29/23 1600 11/29/23 1615 11/29/23 1830 11/29/23 2043   BP: 94/76 98/63 110/71 128/82  Pulse: 76 78 78 81  Resp:   (!) 26 16  Temp:    99 F (37.2 C)  TempSrc:    Oral  SpO2: 96% 97% 96% 96%  Weight:      Height:       General: Elderly male. Awake and alert and oriented x3. Not in any acute distress.  HEENT: NCAT.  PERRLA. EOMI. Sclerae anicteric.  Moist mucosal membranes. Neck: Neck supple without lymphadenopathy. No carotid bruits. No masses palpated.  Cardiovascular: Regular rate with normal S1-S2 sounds. No murmurs, rubs or gallops auscultated. No JVD.  Respiratory: Clear breath sounds.  No accessory muscle use. Abdomen: Soft, nontender, nondistended. Active bowel sounds. No masses or hepatosplenomegaly  Skin: No rashes, lesions, or ulcerations.  Dry, warm to touch. Musculoskeletal:  2+ dorsalis pedis and radial pulses. Good ROM.  No contractures  Psychiatric: Intact judgment and insight.  Mood appropriate to current condition. Neurologic: No focal neurological deficits. Strength is 5/5 x 4.  CN II -  XII grossly intact.  Data Reviewed: EKG personally reviewed showed normal sinus rhythm at a rate of 76 bpm with APCs  Assessment and Plan: UTI POA Urinalysis was suggestive of UTI Patient was started on IV ceftriaxone , we shall continue with same at this time Urine culture pending  Nausea and vomiting Continue Zofran  as needed  Failure to thrive in adult Protein supplement will be provided Dietitian will be consulted and we shall await further recommendations Continue fall precaution Continue PT/OT eval and treat  History of dysphagia with recurrent aspiration pneumonia Continue aspiration precaution Speech therapy evaluation in the morning  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 Patient does not follow up with any Oncologist.  He states that he completed his treatment  Mixed hyperlipidemia Continue Crestor   Anxiety/mood  disorder Continue Xanax , citalopram   RLS Continue ropinirole   Generalized weakness and deconditioning Continue fall precaution Continue PT/OT eval and treat  Goals of care Palliative care will be consulted   Advance Care Planning:   Code Status: Limited: Do not attempt resuscitation (DNR) -DNR-LIMITED -Do Not Intubate/DNI    Consults: None  Family Communication: None at bedside  Severity of Illness: The appropriate patient status for this patient is INPATIENT. Inpatient status is judged to be reasonable and necessary in order to provide the required intensity of service to ensure the patient's safety. The patient's presenting symptoms, physical exam findings, and initial radiographic and laboratory data in the context of their chronic comorbidities is felt to place them at high risk for further clinical deterioration. Furthermore, it is not anticipated that the patient will be medically stable for discharge from the hospital within 2 midnights of admission.   * I certify that at the point of admission it is my clinical judgment that the patient will require inpatient hospital care spanning beyond 2 midnights from the point of admission due to high intensity of service, high risk for further deterioration and high frequency of surveillance required.*  Author: Sylar Voong, DO 11/29/2023 11:39 PM  For on call review [email or web address].

## 2023-11-29 NOTE — ED Triage Notes (Signed)
 Pt arrived POV with c/o n/v x 2 days . Pt started jerks and eye twitching last night.

## 2023-11-29 NOTE — ED Notes (Addendum)
 Report given to [facility] of [facility] at this time. No questions, comments, or concerns after report was given.

## 2023-11-29 NOTE — ED Notes (Addendum)
 Pt normally self caths at home. Pt was able to self cath with the assistance of this nurse using a 14 fr. Straight cath

## 2023-11-29 NOTE — H&P (Incomplete)
 History and Physical    Patient: [name] FMW:[id number] DOB: Dec 25, 1950 DOA: 11/29/2023 DOS: the patient was seen and examined on 11/29/2023 PCP: Marvine Rush, MD  Patient coming from: Home  Chief Complaint:  Chief Complaint  Patient presents with  . Nausea  . Emesis   HPI: [name] is a 73 y.o. 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  Patient is currently on Macrobid due to chronic UTIs  ED course In the emergency department, BP was 126/59 and other vital signs were within normal range.  Workup in ED showed normocytic anemia, normal BMP except for bicarb of 23 and BUN of 29.  Lipase 102, lactic acid 0.9, urinalysis was suggestive of UTI.  Blood culture pending CT abdomen and pelvis showed numerous gallstones, numerous large calcified bladder stones, small hiatal hernia and bibasilar interstitial prominence and nodularity suggesting atypical infectious process or inflammation CT chest without contrast was suggestive of interstitial lung disease or inflammatory process.  Subsolid 5 mm RLL pulmonary nodule noted. Chest x-ray showed no acute cardiopulmonary process CT head shows no acute intracranial abnormality Patient was treated with IV ceftriaxone , Zofran  was given and IV hydration was provided.     Review of Systems: As mentioned in the history of present illness. All other systems reviewed and are negative. Past Medical History:  Diagnosis Date  . Allergy   . Anxiety   . Arthritis    knees, HIps, Hands  . Chest pain    10/14  . Complication of anesthesia     bleeding during intubation 04/04/13 due to friability of right tonsillar cancer  . Constipation   . Depression   . Fibromyalgia   . Lung cancer (HCC)   . Neuropathy    compression neuropathy right hip;s/p replacement  . PEG (percutaneous endoscopic gastrostomy) status (HCC)   . Pneumonia 01/13/2010  . 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 Tonsillar cancer (HCC)   . Type II diabetes mellitus (HCC)   . Urethral stricture    s/p dilitation   Past Surgical History:  Procedure Laterality Date  . CYSTOSCOPY     Dr. Ottelin  . GASTROSTOMY TUBE PLACEMENT  04/04/2013  . LAPAROSCOPIC GASTROSTOMY N/A 04/04/2013   Procedure: LAPAROSCOPIC GASTROSTOMY TUBE PLACEMENT ;  Surgeon: Lynda Leos, MD;  Location: [facility] OR;  Service: General;  Laterality: N/A;  . MULTIPLE EXTRACTIONS WITH ALVEOLOPLASTY N/A 04/14/2013   Procedure: Extraction of tooth #'s 1,2,3,4,5,6,7,8,9,10,11,12,13,14,15,17,18,19,20,21,22,23,24,25,26,27,28,29, 30, 31, and 32 with alveoloplasty and bilateral mandibular tori reductions.;  Surgeon: Tanda JULIANNA Fanny, DDS;  Location: [facility] OR;  Service: Oral Surgery;  Laterality: N/A;  . NASAL HEMORRHAGE CONTROL N/A 04/04/2013   Procedure: Control of oropharyngeal hemorrhage;  Surgeon: Ana LELON Moccasin, MD;  Location: [facility] OR;  Service: ENT;  Laterality: N/A;  . PORTACATH PLACEMENT Right 04/04/2013  . PORTACATH PLACEMENT N/A 04/04/2013   Procedure: INSERTION PORT-A-CATH;  Surgeon: Lynda Leos, MD;  Location: [facility] OR;  Service: General;  Laterality: N/A;  . TOTAL HIP ARTHROPLASTY Right 2000   Dr. Beverley   Social History:  reports that he has never smoked. He has never used  smokeless tobacco. He reports that he does not currently use alcohol. He reports that he does not use drugs.  Allergies  Allergen Reactions  . Neurontin [Gabapentin] Other (See Comments)    Causes seizures  . Buspirone Other (See Comments)    Lips swelling   .  Dilaudid  [Hydromorphone  Hcl] Other (See Comments)    Oxygen  saturation drops    Family History  Problem Relation Age of Onset  . Cancer Cousin        living brain cancer, male  . Cancer Mother        Deceased with leukemia, had uterine ca  . Hyperlipidemia Brother     Prior to Admission medications   Medication Sig Start Date End Date Taking? Authorizing Provider  acetaminophen  (TYLENOL ) 500 MG tablet Take 2 tablets (1,000 mg total) by mouth every 6 (six) hours as needed. Patient taking differently: Take 1,000 mg by mouth every 6 (six) hours as needed for mild pain (pain score 1-3). 03/30/23  Yes Tammy Sor, PA-C  ALPRAZolam  (XANAX ) 0.5 MG tablet Take 1 tablet (0.5 mg total) by mouth 3 (three) times daily. 05/18/23  Yes Emokpae, Courage, MD  citalopram  (CELEXA ) 40 MG tablet Take 1 tablet (40 mg total) by mouth daily. 05/18/23  Yes Pearlean Manus, MD  OXYGEN  Inhale 2 L into the lungs continuous.   Yes [provider]  rOPINIRole  (REQUIP ) 3 MG tablet Take 3 mg by mouth at bedtime. 05/14/23  Yes [provider]  rosuvastatin  (CRESTOR ) 10 MG tablet Take 10 mg by mouth daily. 03/15/23  Yes [provider]  traMADol  (ULTRAM ) 50 MG tablet Take 1 tablet (50 mg total) by mouth every 8 (eight) hours as needed (moderate to severe pain). 05/18/23  Yes Emokpae, Courage, MD  albuterol  (PROVENTIL ) (2.5 MG/3ML) 0.083% nebulizer solution Take 3 mLs (2.5 mg total) by nebulization every 4 (four) hours as needed for wheezing or shortness of breath. 05/18/23   Pearlean Manus, MD  ALPRAZolam  (XANAX ) 1 MG tablet Take 1 tablet (1 mg total) by mouth at bedtime. Patient not taking: Reported on 06/09/2023 05/18/23   Pearlean Manus, MD  alprazolam  (XANAX ) 2 MG tablet Take 2 mg by mouth at bedtime.    [provider]  levofloxacin  (LEVAQUIN ) 750 MG tablet Take 1 tablet (750 mg total) by mouth daily. 06/09/23   Ula Prentice SAUNDERS, MD    Physical Exam: Vitals:   11/29/23 1600 11/29/23 1615  11/29/23 1830 11/29/23 2043  BP: 94/76 98/63 110/71 128/82  Pulse: 76 78 78 81  Resp:   (!) 26 16  Temp:    99 F (37.2 C)  TempSrc:    Oral  SpO2: 96% 97% 96% 96%  Weight:      Height:       General: Elderly male. Awake and alert and oriented x3. Not in any acute distress.  HEENT: NCAT.  PERRLA. EOMI. Sclerae anicteric.  Moist mucosal membranes. Neck: Neck supple without lymphadenopathy. No carotid bruits. No masses palpated.  Cardiovascular: Regular rate with normal S1-S2 sounds. No murmurs, rubs or gallops auscultated. No JVD.  Respiratory: Clear breath sounds.  No accessory muscle use. Abdomen: Soft, nontender, nondistended. Active bowel sounds. No masses or hepatosplenomegaly  Skin: No rashes, lesions, or ulcerations.  Dry, warm to touch. Musculoskeletal:  2+ dorsalis pedis and radial pulses. Good ROM.  No contractures  Psychiatric: Intact judgment and insight.  Mood appropriate to current condition. Neurologic: No focal neurological deficits. Strength is 5/5 x 4.  CN II -  XII grossly intact.  Data Reviewed: EKG personally reviewed showed normal sinus rhythm at a rate of 76 bpm with APCs  Assessment and Plan: UTI POA Urinalysis was suggestive of UTI Patient was started on IV ceftriaxone , we shall continue with same at this time Urine culture pending  Nausea and vomiting Continue Zofran  as needed  Failure to thrive in adult Protein supplement will be provided Dietitian will be consulted and we shall await further recommendations Continue fall precaution Continue PT/OT eval and treat  History of dysphagia with recurrent aspiration pneumonia Continue aspiration precaution Speech therapy evaluation in the morning  Metastatic tonsillar SCC: mets to lung, s/p chemoradiation previously NED in '19; lost to f/u with oncology.  Reestablish care with oncology, Dr. Rogers after discharge.  ****   Mixed hyperlipidemia Continue Crestor   Anxiety/mood disorder Continue  Xanax , citalopram   RLS Continue ropinirole   Generalized weakness and deconditioning Continue fall precaution Continue PT/OT eval and treat   Advance Care Planning:   Code Status: Limited: Do not attempt resuscitation (DNR) -DNR-LIMITED -Do Not Intubate/DNI  ***  Consults: ***  Family Communication: ***  Severity of Illness: {Observation/Inpatient:21159}  Author: Posey Maier, DO 11/29/2023 11:39 PM  For on call review [email or web address].

## 2023-11-29 NOTE — ED Provider Notes (Signed)
 [facility] AT [facility] Provider Note   CSN: [id number] Arrival date & time: 11/29/23  1427     Patient presents with: Nausea and Emesis   [name] is a 73 y.o. male.  He is brought in by family who is helping assist with history.  Has a history of diabetes, lung cancer, tonsil cancer, urinary retention and does self catheterizations.  Lives alone.  For the last couple days has had nausea and vomiting.  Poor p.o. intake.  Sometimes has some abdominal pain but seems to be related to some involuntary jerking that he has which is new.  No known fevers or chills.  Is currently on Macrobid for UTI, has chronic UTIs.  {Add pertinent medical, surgical, social history, OB history to YEP:67052} The history is provided by the patient and a relative.  Emesis Severity:  Moderate Duration:  3 days Timing:  Intermittent Associated symptoms: abdominal pain   Associated symptoms: no cough, no diarrhea and no fever        Prior to Admission medications   Medication Sig Start Date End Date Taking? Authorizing Provider  acetaminophen  (TYLENOL ) 500 MG tablet Take 2 tablets (1,000 mg total) by mouth every 6 (six) hours as needed. Patient taking differently: Take 1,000 mg by mouth every 6 (six) hours as needed for mild pain (pain score 1-3). 03/30/23   Tammy Sor, PA-C  albuterol  (PROVENTIL ) (2.5 MG/3ML) 0.083% nebulizer solution Take 3 mLs (2.5 mg total) by nebulization every 4 (four) hours as needed for wheezing or shortness of breath. 05/18/23   Pearlean Manus, MD  ALPRAZolam  (XANAX ) 0.5 MG tablet Take 1 tablet (0.5 mg total) by mouth 3 (three) times daily. 05/18/23   Pearlean Manus, MD  ALPRAZolam  (XANAX ) 1 MG tablet Take 1 tablet (1 mg total) by mouth at bedtime. Patient not taking: Reported on 06/09/2023 05/18/23   Pearlean Manus, MD  alprazolam  (XANAX ) 2 MG tablet Take 2 mg by mouth at bedtime.    [provider]  citalopram  (CELEXA ) 40 MG tablet Take  1 tablet (40 mg total) by mouth daily. 05/18/23   Pearlean Manus, MD  levofloxacin  (LEVAQUIN ) 750 MG tablet Take 1 tablet (750 mg total) by mouth daily. 06/09/23   Ula Prentice SAUNDERS, MD  OXYGEN  Inhale 2 L into the lungs continuous.    [provider]  rOPINIRole  (REQUIP ) 3 MG tablet Take 3 mg by mouth at bedtime. 05/14/23   [provider]  rosuvastatin  (CRESTOR ) 10 MG tablet Take 10 mg by mouth daily. 03/15/23   [provider]  traMADol  (ULTRAM ) 50 MG tablet Take 1 tablet (50 mg total) by mouth every 8 (eight) hours as needed (moderate to severe pain). 05/18/23   Pearlean Manus, MD    Allergies: Neurontin [gabapentin], Buspirone, and Dilaudid  [hydromorphone  hcl]    Review of Systems  Constitutional:  Negative for fever.  Respiratory:  Negative for cough.   Cardiovascular:  Negative for chest pain.  Gastrointestinal:  Positive for abdominal pain and vomiting. Negative for diarrhea.  Genitourinary:  Negative for dysuria.  Neurological:  Positive for tremors.    Updated Vital Signs BP (!) 91/52   Pulse 75   Temp 98.2 F (36.8 C)   Resp 18   Ht 6' 4 (1.93 m)   Wt 73.9 kg   SpO2 94%   BMI 19.84 kg/m   Physical Exam Vitals and nursing note reviewed.  Constitutional:      General: He is not in acute  distress.    Appearance: Normal appearance. He is well-developed.  HENT:     Head: Normocephalic and atraumatic.  Eyes:     Conjunctiva/sclera: Conjunctivae normal.  Cardiovascular:     Rate and Rhythm: Normal rate and regular rhythm.     Heart sounds: No murmur heard. Pulmonary:     Effort: Pulmonary effort is normal. No respiratory distress.     Breath sounds: Normal breath sounds.  Abdominal:     Palpations: Abdomen is soft.     Tenderness: There is no abdominal tenderness.  Musculoskeletal:        General: No deformity.     Cervical back: Neck supple.  Skin:    General: Skin is warm and dry.     Capillary Refill: Capillary refill takes less than 2  seconds.  Neurological:     General: No focal deficit present.     Mental Status: He is alert.     Sensory: No sensory deficit.     Motor: No weakness.     Comments: Patient will have episodes where he spasms up.  They last for a few seconds.  He seems to be awake and alert through these.     (all labs ordered are listed, but only abnormal results are displayed) Labs Reviewed  LIPASE, BLOOD - Abnormal; Notable for the following components:      Result Value   Lipase 102 (*)    All other components within normal limits  COMPREHENSIVE METABOLIC PANEL WITH GFR - Abnormal; Notable for the following components:   CO2 33 (*)    BUN 29 (*)    All other components within normal limits  CBC - Abnormal; Notable for the following components:   RBC 4.01 (*)    Hemoglobin 12.2 (*)    HCT 38.0 (*)    All other components within normal limits  CULTURE, BLOOD (ROUTINE X 2)  CULTURE, BLOOD (ROUTINE X 2)  URINE CULTURE  URINALYSIS, ROUTINE W REFLEX MICROSCOPIC  LACTIC ACID, PLASMA  LACTIC ACID, PLASMA    EKG: None  Radiology: No results found.  {Document cardiac monitor, telemetry assessment procedure when appropriate:32947} Procedures   Medications Ordered in the ED  sodium chloride  0.9 % bolus 1,000 mL (has no administration in time range)  ondansetron  (ZOFRAN ) injection 4 mg (has no administration in time range)      {Click here for ABCD2, HEART and other calculators REFRESH Note before signing:1}                              Medical Decision Making Amount and/or Complexity of Data Reviewed Labs: ordered. Radiology: ordered.  Risk Prescription drug management.   This patient complains of ***; this involves an extensive number of treatment Options and is a complaint that carries with it a high risk of complications and morbidity. The differential includes ***  I ordered, reviewed and interpreted labs, which included *** I ordered medication *** and reviewed PMP when  indicated. I ordered imaging studies which included *** and I independently    visualized and interpreted imaging which showed *** Additional history obtained from *** Previous records obtained and reviewed *** I consulted *** and discussed lab and imaging findings and discussed disposition.  Cardiac monitoring reviewed, *** Social determinants considered, *** Critical Interventions: ***  After the interventions stated above, I reevaluated the patient and found *** Admission and further testing considered, ***   {Document critical care time  when appropriate  Document review of labs and clinical decision tools ie CHADS2VASC2, etc  Document your independent review of radiology images and any outside records  Document your discussion with family members, caretakers and with consultants  Document social determinants of health affecting pt's care  Document your decision making why or why not admission, treatments were needed:32947:::1}   Final diagnoses:  None    ED Discharge Orders     None

## 2023-11-30 DIAGNOSIS — N3000 Acute cystitis without hematuria: Secondary | ICD-10-CM

## 2023-11-30 LAB — PHOSPHORUS: Phosphorus: 2.5 mg/dL (ref 2.5–4.6)

## 2023-11-30 LAB — COMPREHENSIVE METABOLIC PANEL WITH GFR
ALT: 11 U/L (ref 0–44)
AST: 17 U/L (ref 15–41)
Albumin: 3.4 g/dL — ABNORMAL LOW (ref 3.5–5.0)
Alkaline Phosphatase: 69 U/L (ref 38–126)
Anion gap: 5 (ref 5–15)
BUN: 23 mg/dL (ref 8–23)
CO2: 32 mmol/L (ref 22–32)
Calcium: 8.5 mg/dL — ABNORMAL LOW (ref 8.9–10.3)
Chloride: 105 mmol/L (ref 98–111)
Creatinine, Ser: 0.87 mg/dL (ref 0.61–1.24)
GFR, Estimated: >60 mL/min (ref >60)
Glucose, Bld: 84 mg/dL (ref 70–99)
Potassium: 4.1 mmol/L (ref 3.5–5.1)
Sodium: 141 mmol/L (ref 135–145)
Total Bilirubin: 0.4 mg/dL (ref 0.0–1.2)
Total Protein: 6.1 g/dL — ABNORMAL LOW (ref 6.5–8.1)

## 2023-11-30 LAB — CBC
HCT: 33.1 % — ABNORMAL LOW (ref 39.0–52.0)
Hemoglobin: 10.6 g/dL — ABNORMAL LOW (ref 13.0–17.0)
MCH: 30.1 pg (ref 26.0–34.0)
MCHC: 32 g/dL (ref 30.0–36.0)
MCV: 94 fL (ref 80.0–100.0)
Platelets: 165 K/uL (ref 150–400)
RBC: 3.52 MIL/uL — ABNORMAL LOW (ref 4.22–5.81)
RDW: 15.3 % (ref 11.5–15.5)
WBC: 4.9 K/uL (ref 4.0–10.5)
nRBC: 0 % (ref 0.0–0.2)

## 2023-11-30 LAB — MAGNESIUM: Magnesium: 2.2 mg/dL (ref 1.7–2.4)

## 2023-11-30 MED ORDER — CITALOPRAM HYDROBROMIDE 20 MG PO TABS
40.0000 mg | ORAL_TABLET | Freq: Every day | ORAL | Status: DC
  Administered 2023-11-30 – 2023-12-02 (×3): 40 mg via ORAL
  Filled 2023-11-30 (×3): qty 2

## 2023-11-30 MED ORDER — ALPRAZOLAM 1 MG PO TABS
2.0000 mg | ORAL_TABLET | Freq: Every day | ORAL | Status: DC
  Administered 2023-11-30 – 2023-12-01 (×3): 2 mg via ORAL
  Filled 2023-11-30 (×3): qty 2

## 2023-11-30 MED ORDER — ROSUVASTATIN CALCIUM 10 MG PO TABS
10.0000 mg | ORAL_TABLET | Freq: Every day | ORAL | Status: DC
  Administered 2023-11-30 – 2023-12-02 (×3): 10 mg via ORAL
  Filled 2023-11-30 (×3): qty 1

## 2023-11-30 MED ORDER — ROPINIROLE HCL 1 MG PO TABS
3.0000 mg | ORAL_TABLET | Freq: Every day | ORAL | Status: DC
  Administered 2023-11-30 – 2023-12-01 (×3): 3 mg via ORAL
  Filled 2023-11-30 (×3): qty 3

## 2023-11-30 NOTE — Consult Note (Signed)
 Consultation Note Date: 12/01/2023   Patient Name: [name]  DOB: 12-14-1950  MRN: [id number]  Age / Sex: 73 y.o., male  PCP: Marvine Rush, MD Referring Physician: Dino Antu, MD  Reason for Consultation: Establishing goals of care  HPI/Patient Profile: 73 y.o. 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   Clinical Assessment and Goals of Care: Consult received and chart review completed. I met today with [name] - no family at bedside. He is awake and engages in conversation. Alert and oriented and appropriate in conversation. He reports feeling much improved and likely ready to return home. He confirms he lives by himself and able to manage. His daughter lives next door and is very supportive. He shares his main concern is feeling unstable on his legs due to severe neuropathy. He uses walker at home but still worries about falling. He reports last fall ~1 year ago. He is open to PT/OT follow up at home. His other concern is poor appetite and ongoing weight loss. I encouraged intake as well as supplements like Ensure. I discussed with him if he would ever want a feeding tube if this worsens. He tells me that he had a PEG with his throat cancer and hated this. He is not interested in feeding tube although he does share he would consider this in the future if it is life or death. I recommended consideration of follow up with oncology given history of cancer and significant weight loss - he is willing. He shares that his granddaughter, [name], is [employer] and he counts on her to understand this stuff better than I do. He does confirm DNR/DNI. He  gives me permission to call and speak with [name].   I called and discussed with [name]. She shares same concerns with ambulation and weight loss. She is interested to see how he does today with PT/OT. She tells me that he has lost ~100 lbs over the past 1.5 years. She also agrees with oncology follow up/check in if he is willing. [name] confirms that there is no legal guardian and that he makes his own decisions. She reports that he has been clear about his wishes over time. I offered appetite stimulant trial acknowledging that this is not always successful but something we can try to see if this improves intake - she agrees with trial. I explained to her MOST form and that I will send him home with MOST so they can use this to continue their discussions as a family to better prepare for the future - especially if he continues with significant weight loss this will lead to further decline. [name] agrees. No further questions or concerns at this time.   I returned to bedside with [name] and provided MOST/Hard Choices. I discussed mirtazapine  trial and he agrees. He is  sitting up in recliner and ready to return home. He walked in hallway and feels a little weaker than his baseline.   All questions/concerns addressed. Emotional support provided. Updated Dr. Dana, [facility], RN, PT/OT.   Primary Decision Maker PATIENT There is no legal guardian    SUMMARY OF RECOMMENDATIONS   - DNR  - No desire for feeding tube although may consider in the future if worsening - Recommend ongoing family discussions - will send home with MOST form and Hard Choices - Recommend f/u oncology - Trial mirtazapine  for appetite  Code Status/Advance Care Planning: DNR   Symptom Management:  Weight loss: Trial mirtazapine  7.5 mg at bedtime. Encouraged Ensure supplements.   Prognosis:  Overall concerning with significant weight loss and high risk further complications.   Discharge Planning: Home with  Home Health      Primary Diagnoses: Present on Admission:  UTI (urinary tract infection)   I have reviewed the medical record, interviewed the patient and family, and examined the patient. The following aspects are pertinent.  Past Medical History:  Diagnosis Date   Allergy    Anxiety    Arthritis    knees, HIps, Hands   Chest pain    10/14   Complication of anesthesia    bleeding during intubation 04/04/13 due to friability of right tonsillar cancer   Constipation    Depression    Fibromyalgia    Lung cancer (HCC)    Neuropathy    compression neuropathy right hip;s/p replacement   PEG (percutaneous endoscopic gastrostomy) status (HCC)    Pneumonia 01/13/2010   S/P radiation therapy 05/02/2013-06/22/2013   [facility] Gray - Squamous Cell Carcinoma of the tonsil, p16+, T3N2cM0   Tonsillar cancer (HCC)    Type II diabetes mellitus (HCC)    Urethral stricture    s/p dilitation   Social History   Socioeconomic History   Marital status: Married    Spouse name: Not on file   Number of children: Not on file   Years of education: Not on file   Highest education level: Not on file  Occupational History   Not on file  Tobacco Use   Smoking status: Never   Smokeless tobacco: Never  Substance and Sexual Activity   Alcohol use: Not Currently   Drug use: No   Sexual activity: Yes    Comment: married  Other Topics Concern   Not on file  Social History Narrative   Not on file   Social Drivers of Health   Financial Resource Strain: Not on file  Food Insecurity: No Food Insecurity (11/29/2023)   Hunger Vital Sign    Worried About Running Out of Food in the Last Year: Never true    Ran Out of Food in the Last Year: Never true  Transportation Needs: No Transportation Needs (11/29/2023)   PRAPARE - Administrator, Civil Service (Medical): No    Lack of Transportation (Non-Medical): No  Physical Activity: Not on file  Stress: Not on file  Social Connections: Socially  Isolated (11/29/2023)   Social Connection and Isolation Panel    Frequency of Communication with Friends and Family: More than three times a week    Frequency of Social Gatherings with Friends and Family: More than three times a week    Attends Religious Services: Never    Database Administrator or Organizations: No    Attends Banker Meetings: Never    Marital Status: Widowed   Family History  Problem Relation Age of Onset   Cancer Cousin        living brain cancer, male   Cancer Mother        Deceased with leukemia, had uterine ca   Hyperlipidemia Brother    Scheduled Meds:  alprazolam   2 mg Oral QHS   citalopram   40 mg Oral Daily   enoxaparin  (LOVENOX ) injection  40 mg Subcutaneous Q24H   feeding supplement  237 mL Oral BID BM   Influenza vac split trivalent PF  0.5 mL Intramuscular Tomorrow-1000   pneumococcal 20-valent conjugate vaccine  0.5 mL Intramuscular Tomorrow-1000   rOPINIRole   3 mg Oral QHS   rosuvastatin   10 mg Oral Daily   Continuous Infusions:  cefTRIAXone  (ROCEPHIN )  IV 1 g (11/30/23 0907)   PRN Meds:.acetaminophen  **OR** acetaminophen , ondansetron  **OR** ondansetron  (ZOFRAN ) IV Allergies  Allergen Reactions   Neurontin [Gabapentin] Other (See Comments)    Causes seizures   Buspirone Other (See Comments)    Lips swelling    Dilaudid  [Hydromorphone  Hcl] Other (See Comments)    Oxygen  saturation drops   Review of Systems  Constitutional:  Positive for appetite change and unexpected weight change.  Musculoskeletal:  Positive for gait problem.    Physical Exam Vitals and nursing note reviewed.  Constitutional:      General: He is awake. He is not in acute distress.    Appearance: He is ill-appearing.  Cardiovascular:     Rate and Rhythm: Normal rate.  Pulmonary:     Effort: No tachypnea, accessory muscle usage or respiratory distress.  Abdominal:     General: Abdomen is flat.     Palpations: Abdomen is soft.  Neurological:      Mental Status: He is alert and oriented to person, place, and time.     Vital Signs: BP 111/62 (BP Location: Left Arm)   Pulse 65   Temp 98 F (36.7 C) (Oral)   Resp 16   Ht 6' 4 (1.93 m)   Wt 73.9 kg   SpO2 97%   BMI 19.84 kg/m  Pain Scale: 0-10   Pain Score: 0-No pain   SpO2: SpO2: 97 % O2 Device:SpO2: 97 % O2 Flow Rate: .O2 Flow Rate (L/min): 2 L/min  IO: Intake/output summary:  Intake/Output Summary (Last 24 hours) at 11/30/2023 0920 Last data filed at 11/30/2023 0845 Gross per 24 hour  Intake 240 ml  Output 5 ml  Net 235 ml    LBM: Last BM Date : 11/29/23 Baseline Weight: Weight: 73.9 kg Most recent weight: Weight: 73.9 kg     Palliative Assessment/Data:    Time Total: 80 min  Greater than 50%  of this time was spent counseling and coordinating care related to the above assessment and plan.  Signed by: Bernarda Kitty, NP Palliative Medicine Team Pager # [phone] (M-F 8a-5p) Team Phone # [phone] (Nights/Weekends)

## 2023-11-30 NOTE — TOC Initial Note (Signed)
 Transition of Care [facility]) - Initial/Assessment Note    Patient Details  Name: [name] MRN: [id number] Date of Birth: 1950-07-10  Transition of Care [facility]) CM/SW Contact:    Noreen KATHEE Cleotilde ISRAEL Phone Number: 11/30/2023, 3:35 PM  Clinical Narrative:                  CSW spoke with patient granddaughter Cristino who shared that she assist with taking patient to his appointments , but his step daughter Clayborne lives across the street from him. Patient has a walker, cane and is able to dress himself. Step daughter angel assist with bathing. CSW informed granddaughter that we are waiting on PT recommendation and will keep her posted. ICM will continue to follow.    Barriers to Discharge: Continued Medical Work up   Patient Goals and CMS Choice Patient states their goals for this hospitalization and ongoing recovery are:: return back home CMS Medicare.gov Compare Post Acute Care list provided to:: Patient Represenative (must comment) Engineering Geologist- granddaughter)        Expected Discharge Plan and Services     Post Acute Care Choice: Durable Medical Equipment                                        Prior Living Arrangements/Services     Patient language and need for interpreter reviewed:: Yes Do you feel safe going back to the place where you live?: Yes      Need for Family Participation in Patient Care: Yes (Comment) Care giver support system in place?: No (comment)   Criminal Activity/Legal Involvement Pertinent to Current Situation/Hospitalization: No - Comment as needed  Activities of Daily Living   ADL Screening (condition at time of admission) Independently performs ADLs?: Yes (appropriate for developmental age) Is the patient deaf or have difficulty hearing?: No Does the patient have difficulty seeing, even when wearing glasses/contacts?: No Does the patient have difficulty concentrating, remembering, or making decisions?: No  Permission Sought/Granted       Share Information with NAME: Jhase and [name]     Permission granted to share info w Relationship: patient and granddaughter     Emotional Assessment Appearance:: Appears stated age Attitude/Demeanor/Rapport: Engaged Affect (typically observed): Accepting Orientation: : Oriented to Self, Oriented to Place, Oriented to  Time, Oriented to Situation Alcohol / Substance Use: Not Applicable Psych Involvement: No (comment)  Admission diagnosis:  UTI (urinary tract infection) [N39.0] Patient Active Problem List   Diagnosis Date Noted   UTI (urinary tract infection) 11/29/2023   Ground-level fall 05/11/2023   Closed fracture of lesser trochanter of right femur (HCC) 05/11/2023   Septic shock (HCC) 05/11/2023   Acute encephalopathy 05/11/2023   AKI (acute kidney injury) 05/11/2023   Right lower lobe pulmonary infiltrate 03/26/2023   Acute respiratory failure with hypoxia (HCC) 03/26/2023   Pressure injury of skin 03/26/2023   Urinary tract infection 03/26/2023   Aspiration pneumonia of right lower lobe (HCC) 03/25/2023   Head injury 03/24/2023   Critical polytrauma 03/24/2023   Delirium    Anuria 01/18/2018   Acute retention of urine 01/17/2018   Acute metabolic encephalopathy 01/15/2018   Dehydration 01/15/2018   Chronic pain syndrome 01/15/2018   Scalp laceration    Metastasis to lung (HCC) 10/19/2013   Mucositis due to antineoplastic therapy 06/10/2013   Protein-calorie malnutrition, severe 06/10/2013   Rash 06/10/2013   Gastrostomy tube dyslodgement -  replaced 05/06/2013 05/06/2013   Gastrostomy in place [facility]) 04/04/2013   Tonsil cancer (HCC) 03/22/2013   Diabetic neuropathy, painful (HCC) 03/22/2013   Diabetes mellitus, type 2 (HCC) 10/18/2012   Other and unspecified hyperlipidemia 10/18/2012   Obesity, unspecified 10/18/2012   PCP:  Marvine Rush, MD Pharmacy:   [facility] - [place], [place] - [address] [place] [address] Phone:  [phone] Fax: [phone]     Social Drivers of Health (SDOH) Social History: SDOH Screenings   Food Insecurity: No Food Insecurity (11/29/2023)  Housing: High Risk (11/29/2023)  Transportation Needs: No Transportation Needs (11/29/2023)  Utilities: Not At Risk (11/29/2023)  Social Connections: Socially Isolated (11/29/2023)  Tobacco Use: Low Risk  (11/29/2023)   SDOH Interventions:     Readmission Risk Interventions    11/30/2023    3:34 PM 11/30/2023    8:27 AM 05/18/2023    1:21 PM  Readmission Risk Prevention Plan  Transportation Screening Complete Complete Complete  HRI or Home Care Consult Complete Complete Complete  Social Work Consult for Recovery Care Planning/Counseling Complete Complete Complete  Palliative Care Screening Not Applicable Not Applicable Not Applicable  Medication Review Oceanographer) Complete Complete Complete

## 2023-11-30 NOTE — Plan of Care (Signed)

## 2023-11-30 NOTE — Progress Notes (Addendum)
 PROGRESS NOTE    [name]  FMW:[id number] DOB: 06-Nov-1950 DOA: 11/29/2023 PCP: Marvine Rush, MD   Brief Narrative:   73 y.o. 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  He is currently being treated for acute urinary tract infection with intravenous antibiotics.  Follow-up urine cultures and blood cultures and adjust antibiotics accordingly. He is from home, lives by himself and has a walker, cane as well as oxygen  concentrator at home.  Assessment & Plan:  Principal Problem:   UTI (urinary tract infection)    Acute uncomplicated UTI POA Urinalysis was suggestive of UTI, this in the setting of abdominal pain as well as poor oral intake Continue with intravenous ceftriaxone  Follow-up urine and blood cultures   Nausea and vomiting, improving symptoms now Continue Zofran  as needed   Failure to thrive in adult Protein supplement will be provided Dietitian consulted recommendations Continue fall precaution Continue PT/OT eval and treat   History of dysphagia with recurrent aspiration pneumonia Continue aspiration precaution Speech therapy evaluation ordered   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 Patient does not follow up with any Oncologist.  He states that he completed his treatment   Mixed hyperlipidemia Continue Crestor    Anxiety/mood disorder Continue Xanax , citalopram    RLS Continue ropinirole    Generalized weakness and physical deconditioning Continue fall precaution Continue PT/OT eval and treat    Goals of care Palliative care consulted  Disposition: Patient lives at home by himself.  He has a walker, cane as well as oxygen  catheter at home.  He may need home health services on discharge, depending on the PT/OT evaluations and recommendations.  DVT prophylaxis: enoxaparin  (LOVENOX ) injection 40 mg Start: 11/30/23 1000 SCDs Start: 11/29/23 2337     Code Status: Limited: Do not attempt resuscitation (DNR) -DNR-LIMITED -Do Not Intubate/DNI  Family Communication: None at the bedside Status is: Inpatient Remains inpatient appropriate because: Urinary tract infection, physical deconditioning, poor oral intake    Subjective:    Examination:  General exam: Appears calm and comfortable, nasal oxygen  cannula in place Respiratory system: Clear to auscultation. Respiratory effort normal. Cardiovascular system: S1 & S2 heard, RRR. No JVD, murmurs, rubs, gallops or clicks. No pedal edema. Gastrointestinal system: Abdomen is nondistended, soft and nontender. No organomegaly or masses felt. Normal bowel sounds heard. Central nervous system: Alert and oriented. No focal neurological deficits. Extremities: Symmetric 5 x 5 power. Skin: No rashes, lesions or ulcers Psychiatry: Judgement and insight appear normal. Mood & affect appropriate.       Diet Orders (From admission, onward)     Start     Ordered   11/29/23 2339  Diet Heart Room service appropriate? Yes; Fluid consistency: Thin  Diet effective now       Question Answer Comment  Room service appropriate? Yes   Fluid consistency: Thin      11/29/23 2339            Objective: Vitals:   11/29/23 1830 11/29/23 2043 11/30/23 0030 11/30/23 0433  BP: 110/71 128/82 101/60 111/62  Pulse: 78 81 80 65  Resp: (!) 26 16 20 16   Temp:  99 F (37.2 C)  98 F (36.7 C) 98 F (36.7 C)  TempSrc:  Oral Oral Oral  SpO2: 96% 96% 98% 97%  Weight:      Height:        Intake/Output Summary (Last 24 hours) at 11/30/2023 0903 Last  data filed at 11/30/2023 0845 Gross per 24 hour  Intake 240 ml  Output 5 ml  Net 235 ml   Filed Weights   11/29/23 1442  Weight: 73.9 kg    Scheduled Meds:  alprazolam   2 mg Oral QHS   citalopram   40 mg Oral Daily   enoxaparin  (LOVENOX ) injection  40 mg Subcutaneous Q24H   feeding supplement  237 mL Oral BID BM   Influenza vac split trivalent PF  0.5 mL Intramuscular Tomorrow-1000   pneumococcal 20-valent conjugate vaccine  0.5 mL Intramuscular Tomorrow-1000   rOPINIRole   3 mg Oral QHS   rosuvastatin   10 mg Oral Daily   Continuous Infusions:  cefTRIAXone  (ROCEPHIN )  IV      Nutritional status     Body mass index is 19.84 kg/m.  Data Reviewed:   CBC: Recent Labs  Lab 11/29/23 1545 11/30/23 0442  WBC 6.5 4.9  HGB 12.2* 10.6*  HCT 38.0* 33.1*  MCV 94.8 94.0  PLT 185 165   Basic Metabolic Panel: Recent Labs  Lab 11/29/23 1521 11/30/23 0442  NA 139 141  K 4.9 4.1  CL 100 105  CO2 33* 32  GLUCOSE 95 84  BUN 29* 23  CREATININE 1.14 0.87  CALCIUM  9.5 8.5*  MG  --  2.2  PHOS  --  2.5   GFR: Estimated Creatinine Clearance: 80.2 mL/min (by C-G formula based on SCr of 0.87 mg/dL). Liver Function Tests: Recent Labs  Lab 11/29/23 1521 11/30/23 0442  AST 27 17  ALT 15 11  ALKPHOS 83 69  BILITOT 0.5 0.4  PROT 7.2 6.1*  ALBUMIN 4.1 3.4*   Recent Labs  Lab 11/29/23 1521  LIPASE 102*   No results for input(s): AMMONIA in the last 168 hours. Coagulation Profile: No results for input(s): INR, PROTIME in the last 168 hours. Cardiac Enzymes: No results for input(s): CKTOTAL, CKMB, CKMBINDEX, TROPONINI in the last 168 hours. BNP (last 3 results) No results for input(s): PROBNP in the last 8760 hours. HbA1C: No results for input(s): HGBA1C in the last 72 hours. CBG: No results for input(s): GLUCAP in the last 168 hours. Lipid Profile: No results for input(s): CHOL, HDL, LDLCALC, TRIG, CHOLHDL, LDLDIRECT in the last 72  hours. Thyroid  Function Tests: No results for input(s): TSH, T4TOTAL, FREET4, T3FREE, THYROIDAB in the last 72 hours. Anemia Panel: No results for input(s): VITAMINB12, FOLATE, FERRITIN, TIBC, IRON, RETICCTPCT in the last 72 hours. Sepsis Labs: Recent Labs  Lab 11/29/23 1623  LATICACIDVEN 0.9    Recent Results (from the past 240 hours)  Culture, blood (routine x 2)     Status: None (Preliminary result)   Collection Time: 11/29/23  4:23 PM   Specimen: BLOOD  Result Value Ref Range Status   Specimen Description BLOOD BLOOD RIGHT FOREARM  Final   Special Requests   Final    AEROBIC BOTTLE ONLY Blood Culture results may not be optimal due to an inadequate volume of blood received in culture bottles   Culture   Final    NO GROWTH < 24 HOURS Performed at [facility], [address], [place], [place] [address]    Report Status PENDING  Incomplete  Culture, blood (routine x 2)  Status: None (Preliminary result)   Collection Time: 11/29/23  4:23 PM   Specimen: BLOOD  Result Value Ref Range Status   Specimen Description BLOOD BLOOD RIGHT HAND  Final   Special Requests   Final    BOTTLES DRAWN AEROBIC AND ANAEROBIC Blood Culture adequate volume   Culture   Final    NO GROWTH < 24 HOURS Performed at [facility], [address] [place], [place] [address]    Report Status PENDING  Incomplete         Radiology Studies: CT Chest Wo Contrast Result Date: 11/29/2023 EXAM: CT CHEST WITHOUT CONTRAST 11/29/2023 08:20:23 PM TECHNIQUE: CT of the chest was performed without the administration of intravenous contrast. Multiplanar reformatted images are provided for review. Automated exposure control, iterative reconstruction, and/or weight based adjustment of the mA/kV was utilized to reduce the radiation dose to as low as reasonably achievable. COMPARISON: None available. CLINICAL HISTORY: Respiratory illness, nondiagnostic xray. FINDINGS: MEDIASTINUM: Heart  demonstrates 4-vessel coronary artery calcifications. Pericardium is unremarkable. The central airways are clear. Mild atherosclerotic plaque of the aorta. Enlarged left thyroid  gland with underlying well-calcified hyperdense nodule measuring up to 2 cm. LYMPH NODES: No mediastinal, hilar or axillary lymphadenopathy. No gross hilar adenopathy with limited evaluation on this noncontrast study. LUNGS AND PLEURA: Biapical pleural/pulmonary scarring. Bilateral lower lobe, basilar predominant, reticulations as well as peribronchovascular ground-glass airspace opacities and micronodules. Subsolid 5 mm right lower lobe pulmonary nodule (4.101). No pulmonary edema. No pleural effusion or pneumothorax. SOFT TISSUES/BONES: No acute abnormality of the bones or soft tissues. UPPER ABDOMEN: Limited images of the upper abdomen demonstrates gallstones within the gallbladder lumen and a small hiatal hernia. IMPRESSION: 1. Bilateral lower lobe basilar-predominant reticulations with peribronchovascular ground-glass opacities and micronodules, suggest further clinical and imaging evaluation for interstitial lung disease or inflammatory process as indicated. 2. Subsolid 5 mm right lower lobe pulmonary nodule; as a subsolid nodule <6 mm, no routine follow-up is recommended per Fleischner Society Guidelines. 3. Enlarged left thyroid  with a well-calcified 2 cm nodule; in patients 35 years, recommend non-emergent thyroid  ultrasound. Electronically signed by: Morgane Naveau MD 11/29/2023 08:46 PM EST RP Workstation: [id number]   CT ABDOMEN PELVIS W CONTRAST Result Date: 11/29/2023 EXAM: CT ABDOMEN AND PELVIS WITH CONTRAST 11/29/2023 05:54:33 PM TECHNIQUE: CT of the abdomen and pelvis was performed with the administration of 100 mL of iohexol  (OMNIPAQUE ) 300 MG/ML solution. Multiplanar reformatted images are provided for review. Automated exposure control, iterative reconstruction, and/or weight-based adjustment of the mA/kV was  utilized to reduce the radiation dose to as low as reasonably achievable. COMPARISON: Prior study 03/25/2023. CLINICAL HISTORY: Abdominal pain, acute, nonlocalized. FINDINGS: LOWER CHEST: Bibasilar interstitial prominence and nodularity suggesting atypical infectious process or inflammation. LIVER: The liver is unremarkable. GALLBLADDER AND BILE DUCTS: Numerous gallstones. No biliary ductal dilatation. SPLEEN: No acute abnormality. PANCREAS: No acute abnormality. ADRENAL GLANDS: No acute abnormality. KIDNEYS, URETERS AND BLADDER: No stones in the kidneys or ureters. No hydronephrosis. No perinephric or periureteral stranding. Numerous large calcified bladder stones. GI AND BOWEL: Small hiatal hernia. Stomach demonstrates no acute abnormality. There is no bowel obstruction. PERITONEUM AND RETROPERITONEUM: No ascites. No free air. VASCULATURE: Aorta is normal in caliber. LYMPH NODES: No lymphadenopathy. REPRODUCTIVE ORGANS: No acute abnormality. BONES AND SOFT TISSUES: Right hip prosthesis with artifact degrading images of the pelvis. No acute osseous abnormality. No focal soft tissue abnormality. IMPRESSION: 1. Numerous gallstones 2. Numerous large calcified bladder stones 3. Small hiatal hernia 4. Bibasilar interstitial prominence and nodularity  suggesting atypical infectious process or inflammation; recommend clinical correlation and dedicated chest CT if indicated for further evaluation Electronically signed by: Fonda Field MD 11/29/2023 06:05 PM EST RP Workstation: [id number]   CT Head Wo Contrast Result Date: 11/29/2023 EXAM: CT HEAD WITHOUT CONTRAST 11/29/2023 05:54:33 PM TECHNIQUE: CT of the head was performed without the administration of intravenous contrast. Automated exposure control, iterative reconstruction, and/or weight based adjustment of the mA/kV was utilized to reduce the radiation dose to as low as reasonably achievable. COMPARISON: 05/11/2023 CLINICAL HISTORY: Nausea and vomiting.  Myoclonus. FINDINGS: BRAIN AND VENTRICLES: Periventricular and subcortical white matter hypoattenuation is mildly advanced for age. Minimal atherosclerotic changes are present within the cavernous internal carotid arteries bilaterally. No hyperdense vessel is present. No acute hemorrhage. No evidence of acute infarct. No hydrocephalus. No extra-axial collection. No mass effect or midline shift. ORBITS: No acute abnormality. SINUSES: No acute abnormality. SOFT TISSUES AND SKULL: No acute soft tissue abnormality. No skull fracture. IMPRESSION: 1. No acute intracranial abnormality. 2. Mildly advanced periventricular and subcortical white matter hypoattenuation for age. Electronically signed by: Lonni Necessary MD 11/29/2023 05:59 PM EST RP Workstation: [id number]   DG Chest Port 1 View Result Date: 11/29/2023 EXAM: 1 VIEW(S) XRAY OF THE CHEST 11/29/2023 04:16:27 PM COMPARISON: 06/09/2023. CLINICAL HISTORY: weakness FINDINGS: LINES, TUBES AND DEVICES: Right side port-a-cath tip mid SVC. LUNGS AND PLEURA: Hyperinflated lungs consistent with COPD. No pleural effusion. No pneumothorax. HEART AND MEDIASTINUM: Right side port-a-cath tip mid SVC. No acute abnormality of the cardiac and mediastinal silhouettes. BONES AND SOFT TISSUES: No acute osseous abnormality. IMPRESSION: 1. Hyperinflated lungs consistent with COPD. 2. No acute cardiopulmonary process Electronically signed by: Fonda Field MD 11/29/2023 04:35 PM EST RP Workstation: [id number]           LOS: 1 day   Time spent= 35 mins    Deliliah Room, MD [facility]  If 7PM-7AM, please contact night-coverage  11/30/2023, 9:03 AM

## 2023-11-30 NOTE — Care Management Obs Status (Signed)
 MEDICARE OBSERVATION STATUS NOTIFICATION   Patient Details  Name: [name] MRN: [id number] Date of Birth: 04/12/1950   Medicare Observation Status Notification Given:  Yes    Noreen KATHEE Pinal, LCSWA 11/30/2023, 11:24 AM

## 2023-11-30 NOTE — Evaluation (Signed)
 Clinical/Bedside Swallow Evaluation Patient Details  Name: [name] MRN: [id number] Date of Birth: Dec 30, 1950  Today's Date: 11/30/2023 Time: SLP Start Time (ACUTE ONLY): 1601 SLP Stop Time (ACUTE ONLY): 1626 SLP Time Calculation (min) (ACUTE ONLY): 25 min  Past Medical History:  Past Medical History:  Diagnosis Date   Allergy    Anxiety    Arthritis    knees, HIps, Hands   Chest pain    10/14   Complication of anesthesia    bleeding during intubation 04/04/13 due to friability of right tonsillar cancer   Constipation    Depression    Fibromyalgia    Lung cancer (HCC)    Neuropathy    compression neuropathy right hip;s/p replacement   PEG (percutaneous endoscopic gastrostomy) status (HCC)    Pneumonia 01/13/2010   S/P radiation therapy 05/02/2013-06/22/2013   70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 Ottelin   GASTROSTOMY TUBE PLACEMENT  04/04/2013   LAPAROSCOPIC GASTROSTOMY N/A 04/04/2013   Procedure: LAPAROSCOPIC GASTROSTOMY TUBE PLACEMENT ;  Surgeon: Lynda Leos, MD;  Location: [facility] OR;  Service: General;  Laterality: N/A;   MULTIPLE EXTRACTIONS WITH ALVEOLOPLASTY N/A 04/14/2013   Procedure: Extraction of tooth #'s 1,2,3,4,5,6,7,8,9,10,11,12,13,14,15,17,18,19,20,21,22,23,24,25,26,27,28,29, 30, 31, and 32 with alveoloplasty and bilateral mandibular tori reductions.;  Surgeon: Tanda JULIANNA Fanny, DDS;  Location: [facility] OR;  Service: Oral Surgery;  Laterality: N/A;   NASAL HEMORRHAGE CONTROL N/A 04/04/2013   Procedure: Control of oropharyngeal hemorrhage;  Surgeon: Ana LELON Moccasin, MD;  Location: [facility] OR;  Service: ENT;  Laterality: N/A;   PORTACATH PLACEMENT Right 04/04/2013   PORTACATH PLACEMENT N/A 04/04/2013   Procedure: INSERTION PORT-A-CATH;  Surgeon: Lynda Leos, MD;  Location: [facility] OR;  Service: General;  Laterality: N/A;   TOTAL HIP ARTHROPLASTY Right 2000   Dr. Beverley   HPI:  73 y.o. 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     He is currently being treated for acute urinary tract infection with intravenous antibiotics.  Follow-up urine cultures and blood cultures and adjust antibiotics accordingly.  He is from home, lives by himself and has a walker, cane as well as oxygen  concentrator at home. Pt known to SLP service and last MBSS was completed 05/13/2023. BSE requested.   MBSS from 05/13/2023: <<Pt presents with mild oral and moderately severe pharyngeal dysphagia. Pt demonstrated very similar presentation on MBSS completed in 2017, with slight worsening of severity on today's exam. Pt with trace to moderate amounts of silent aspiration of thin liquids. NTL was not initially silently aspirated, however majority of bolus of NTL remains in the pharynx after the initial swallow and trace to moderate amounts are then aspirated after the swallow. Oral stage is characterized by slightly disorganized chewing, decreased tongue control and mild oral residue after the swallow. Pharyngeal stage is characterized by decreased base of tongue retraction, little to no epiglottic deflection, very diminished pharyngeal stripping wave, and decreased laryngeal vestibule closure. Impairments result in  moderate to severe amounts of diffuse pharyngeal residue; multiple swallows do not result in complete clearance of pharyngeal residue. As pharyngeal residue mixes with secretions trace amounts fall to the  vocal cords, remain there and are often silently aspirated. Cued throat clear was very effective in clearing penetrates and aspirates however throat clear was not reflexively produced. Pt has been consuming a soft diet with thin liquids since 2017 when he stopped attending OP [facility]. Per pt's daughter's report Pt has only had PNA twice since that time. Barium tablet was administered with thin liquids (this is how Pt takes them at home), however, pill with stasis in the valleculae and despite multiple administrations of varying textures and maneuvers attempted tablet remained in the valleculae and was not visualized passing through the pharynx-- the exact scenario also described on MBSS in 2017. Discussed findings at length with [name]. Despite the slight decrease in volume and frequency of aspiration with NTL, Pt still continues to consistently aspirate some NTL residue. Pt strongly prefers to NOT be on NTL for QOL purposes. Recommend continue with mech soft/D3 diet and thin liquids - recommend small bites/sips, followed by throat clear and multiple dry swallows after each bite/sip. Patient and daughter understand that despite least restrictive diet and strategy usage Pt remains at very high risk for aspiration. Recommend crush meds and administer with puree. Above reviewed with MD, RN and [name]. ST will continue to follow acutely.>>   Assessment / Plan / Recommendation  Clinical Impression  Clinical swallow evaluation completed at bedside. Pt is known to SLP service from previous dysphagia intervention ([facility] head and neck 2015). Pt has been placed on thickened liquids in the past and verbalizes that he wants to remain on thin liquids (he is at risk for aspiration with all liquids). He has been consuming soft textures (mashable foods) and thin liquids at home with use of strategies. Pt assessed with thin water via cup sips, puree, and mech soft textures. He presents with mild wet vocal quality and  delayed throat clearing after thins. He was encouraged to take small bites/sips and cough/clear his throat periodically and repeat swallow. Recommend D3/thin with the same precautions. No further SLP f/u at this time. SLP will sign off. SLP Visit Diagnosis: Dysphagia, oropharyngeal phase (R13.12)    Aspiration Risk  Mild aspiration risk    Diet Recommendation Dysphagia 3 (Mech soft);Thin liquid    Liquid Administration via: Cup Medication Administration: Crushed with puree Supervision: Patient able to self feed Compensations: Slow rate;Small sips/bites;Multiple dry swallows after each bite/sip;Clear throat intermittently;Clear throat after each swallow Postural Changes: Seated upright at 90 degrees;Remain upright for at least 30 minutes after po intake    Other  Recommendations Oral Care Recommendations: Oral care BID;Staff/trained caregiver to provide oral care     Assistance Recommended at Discharge    Functional Status Assessment Patient has not had a recent decline in their functional status  Frequency and Duration            Prognosis Barriers to Reach Goals: Severity of deficits Barriers/Prognosis Comment: previous radiation treatment      Swallow Study   General Date of Onset: 11/29/23 HPI: 73 y.o. 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     He is currently being treated for acute urinary tract infection with intravenous antibiotics.  Follow-up urine cultures and blood cultures and adjust antibiotics accordingly.  He is from home, lives by himself and has a walker, cane as well as oxygen  concentrator at  home. Pt known to SLP service and last MBSS was completed 05/13/2023. BSE requested. Type of Study: Bedside Swallow Evaluation Previous Swallow Assessment: MBSS 05/13/23 D3/thin Diet Prior to this Study: Regular;Thin liquids (Level 0) Temperature Spikes Noted: No Respiratory Status: Room air History of Recent Intubation: No Behavior/Cognition: Alert;Cooperative;Pleasant mood Oral Cavity Assessment: Within Functional Limits Oral Care Completed by SLP: Recent completion by staff Oral Cavity - Dentition: Missing dentition Vision: Functional for self-feeding Self-Feeding Abilities: Able to feed self Patient Positioning: Upright in bed Baseline Vocal Quality: Normal Volitional Cough: Strong Volitional Swallow: Able to elicit    Oral/Motor/Sensory Function Overall Oral Motor/Sensory Function: Within functional limits   Ice Chips Ice chips: Not tested   Thin Liquid Thin Liquid: Impaired Presentation: Cup;Self Fed Pharyngeal  Phase Impairments: Throat Clearing - Delayed    Nectar Thick Nectar Thick Liquid: Not tested   Honey Thick Honey Thick Liquid: Not tested   Puree Puree: Within functional limits Presentation: Spoon   Solid     Solid: Impaired Presentation: Spoon Oral Phase Impairments: Impaired mastication Oral Phase Functional Implications: Prolonged oral transit     Thank you,  Lamar Candy, CCC-SLP [phone]  Lynea Rollison 11/30/2023,4:55 PM

## 2023-11-30 NOTE — Plan of Care (Signed)
  Problem: Acute Rehab OT Goals (only OT should resolve) Goal: Pt. Will Perform Grooming Flowsheets (Taken 11/30/2023 1528) Pt Will Perform Grooming:  with modified independence  standing Goal: Pt. Will Transfer To Toilet Flowsheets (Taken 11/30/2023 1528) Pt Will Transfer to Toilet:  with modified independence  ambulating  Haidyn Kilburg OT, MOT

## 2023-11-30 NOTE — Evaluation (Signed)
 Occupational Therapy Evaluation Patient Details Name: [name] MRN: [id number] DOB: 03-Jun-1950 Today's Date: 11/30/2023   History of Present Illness   [name] is a 73 y.o. 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  Patient is currently on Macrobid due to chronic UTIs (per DO)     Clinical Impressions Pt agreeable to OT evaluation. Pt reports being mostly Independent with daughter present to assist with IADL's. Evaluation was limited to bed level due to pt's low BP that was taken by the RN during the session. B UE strength and A/ROM was [facility]. No physical assist for bed mobility. Unable to stand with pt due to blood pressure. Recommendation is based on what could be observed today and may change once the pt can tolerate standing tasks. As of today pt demonstrates independence with seated ADL's. More evaluation on standing and transfer tasks needed in the future. Pt left in bed with RN present. Pt will benefit from continued OT in the hospital to increase strength, balance, and endurance for safe ADL's.        If plan is discharge home, recommend the following:   A little help with walking and/or transfers;Assist for transportation;Help with stairs or ramp for entrance     Functional Status Assessment   Patient has had a recent decline in their functional status and demonstrates the ability to make significant improvements in function in a reasonable and predictable amount of time.     Equipment Recommendations   None recommended by OT              Precautions/Restrictions   Precautions Precautions: Fall Recall of Precautions/Restrictions: Intact Restrictions Weight Bearing Restrictions Per Provider Order: No     Mobility Bed Mobility Overal bed mobility: Modified Independent             General bed mobility comments: No physical assist need for sit to supine or supine to sit.    Transfers                   General transfer comment: Unable to attempt due to low BP. RN recorded the pt's BP during the session and requested that the pt not stand at this time.      Balance Overall balance assessment: Needs assistance Sitting-balance support: No upper extremity supported, Feet supported Sitting balance-Leahy Scale: Good Sitting balance - Comments: seated at EOB                                   ADL either performed or assessed with clinical judgement   ADL Overall ADL's : Needs assistance/impaired         Upper Body Bathing: Modified independent;Sitting   Lower Body Bathing: Modified independent;Sitting/lateral leans   Upper Body Dressing : Modified independent;Sitting   Lower Body Dressing: Modified independent;Sitting/lateral leans     Toilet Transfer Details (indicate cue type and reason): Limited today by low BP. Toileting- Clothing Manipulation and Hygiene: Modified independent;Sitting/lateral lean         General ADL Comments: Low blood pressure limited the evaluation to bed level.  Vision Baseline Vision/History: 0 No visual deficits Ability to See in Adequate Light: 0 Adequate Patient Visual Report: No change from baseline Vision Assessment?: No apparent visual deficits     Perception Perception: Not tested       Praxis Praxis: Not tested       Pertinent Vitals/Pain Pain Assessment Pain Assessment: No/denies pain     Extremity/Trunk Assessment Upper Extremity Assessment Upper Extremity Assessment: Overall WFL for tasks assessed   Lower Extremity  Assessment Lower Extremity Assessment: Defer to PT evaluation       Communication Communication Communication: Impaired Factors Affecting Communication: Reduced clarity of speech   Cognition Arousal: Alert Behavior During Therapy: WFL for tasks assessed/performed Cognition: No apparent impairments                               Following commands: Intact       Cueing  General Comments   Cueing Techniques: Verbal cues                 Home Living Family/patient expects to be discharged to:: Private residence Living Arrangements: Alone Available Help at Discharge: Family;Available PRN/intermittently Type of Home: House Home Access: Stairs to enter [employer] of Steps: 2 Entrance Stairs-Rails: Right;Left;Can reach both Home Layout: One level     Bathroom Shower/Tub: Producer, Television/film/video: Handicapped height Bathroom Accessibility: Yes   Home Equipment: Cane - single point;Hand held shower head;Grab bars - tub/shower;Shower seat   Additional Comments: Pt reports same living history as prior admission.      Prior Functioning/Environment Prior Level of Function : Driving;Needs assist       Physical Assist : ADLs (physical)   ADLs (physical): IADLs Mobility Comments: Pt reports ambulation with RW or SPC. ADLs Comments: Independent ADL; assist IADL's.    OT Problem List: Decreased strength;Decreased activity tolerance;Impaired balance (sitting and/or standing)   OT Treatment/Interventions: Self-care/ADL training;Therapeutic exercise;Therapeutic activities;Patient/family education;Balance training      OT Goals(Current goals can be found in the care plan section)   Acute Rehab OT Goals Patient Stated Goal: return home OT Goal Formulation: With patient Time For Goal Achievement: 12/14/23 Potential to Achieve Goals: Good   OT Frequency:  Min 1X/week                  AM-PAC OT 6 Clicks Daily Activity      Outcome Measure Help from another person eating meals?: None Help from another person taking care of personal grooming?: None Help from another person toileting, which includes using toliet, bedpan, or urinal?: A Little Help from another person bathing (including washing, rinsing, drying)?: A Little Help from another person to put on and taking off regular upper body clothing?: None Help from another person to put on and taking off regular lower body clothing?: None 6 Click Score: 22   End of Session Equipment Utilized During Treatment: Oxygen  (Saturation was above 90% without supplemental O2. Pt reports wearing oxygen  mostly at night.)  Activity Tolerance: Patient tolerated treatment well;Treatment limited secondary to medical complications (Comment) Patient left: in bed;with call bell/phone within reach;with family/visitor present;with nursing/sitter in room  OT Visit Diagnosis: Unsteadiness on feet (R26.81);Other abnormalities of gait and mobility (R26.89)                Time: 1340-1400 OT Time Calculation (min): 20 min Charges:  OT General Charges $OT Visit: 1 Visit OT Evaluation $OT Eval Low Complexity:  1 Low  Teira Arcilla OT, MOT  Jayson Person 11/30/2023, 3:25 PM

## 2023-11-30 NOTE — Care Management CC44 (Signed)
 Condition Code 44 Documentation Completed  Patient Details  Name: [name] MRN: [id number] Date of Birth: 10-Apr-1950   Condition Code 44 given:  Yes Patient signature on Condition Code 44 notice:  Yes Documentation of 2 MD's agreement:  Yes Code 44 added to claim:  Yes    Noreen KATHEE Pinal, LCSWA 11/30/2023, 11:24 AM

## 2023-11-30 NOTE — Progress Notes (Addendum)
 Pt  self cathed per home regimen. Very minimal output less than 5ml. Pt has had poor po intake was NPO til MN. Encouraged pt to drink water at this time.

## 2023-12-01 DIAGNOSIS — Z515 Encounter for palliative care: Secondary | ICD-10-CM | POA: Diagnosis not present

## 2023-12-01 DIAGNOSIS — R112 Nausea with vomiting, unspecified: Secondary | ICD-10-CM | POA: Diagnosis not present

## 2023-12-01 DIAGNOSIS — Z7189 Other specified counseling: Secondary | ICD-10-CM | POA: Diagnosis not present

## 2023-12-01 DIAGNOSIS — R627 Adult failure to thrive: Secondary | ICD-10-CM | POA: Diagnosis not present

## 2023-12-01 LAB — URINE CULTURE: Culture: >=100000 — AB

## 2023-12-01 MED ORDER — FOSFOMYCIN TROMETHAMINE 3 G PO PACK
3.0000 g | PACK | Freq: Once | ORAL | Status: AC
  Administered 2023-12-01: 3 g via ORAL
  Filled 2023-12-01: qty 3

## 2023-12-01 MED ORDER — MIRTAZAPINE 15 MG PO TABS
7.5000 mg | ORAL_TABLET | Freq: Every day | ORAL | Status: DC
  Administered 2023-12-01: 7.5 mg via ORAL
  Filled 2023-12-01: qty 1

## 2023-12-01 NOTE — Evaluation (Signed)
 Physical Therapy Evaluation Patient Details Name: [name] MRN: [id number] DOB: [birth year] Today's Date: 12/01/2023  History of Present Illness  [name] is a 73 y.o. 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  Patient is currently on Macrobid due to chronic UTIs   Clinical Impression  Patient functioning near baseline for functional mobility and gait demonstrating good return for sitting up at bedside, transferring to chair ambulating in room/hallway using RW without loss of balance. Patient had no c/o of dizziness or lightheadedness throughout visit and no significant difference in orthostatic BP's - nurse notified.  Patient tolerated sitting up in chair after therapy. Patient will benefit from continued skilled physical therapy in hospital and recommended venue below to increase strength, balance, endurance for safe ADLs and gait.           If plan is discharge home, recommend the following: A little help with walking and/or transfers;A little help with bathing/dressing/bathroom;Help with stairs or ramp for entrance;Assist for transportation;Assistance with cooking/housework   Can travel by private vehicle        Equipment Recommendations None recommended by PT  Recommendations for Other Services       Functional Status Assessment Patient has had a recent decline in their functional status and demonstrates the ability to make significant improvements in function in a reasonable and predictable amount of time.     Precautions / Restrictions Precautions Precautions:  Fall Recall of Precautions/Restrictions: Intact Restrictions Weight Bearing Restrictions Per Provider Order: No      Mobility  Bed Mobility Overal bed mobility: Modified Independent             General bed mobility comments: slightly labored movement    Transfers Overall transfer level: Needs assistance Equipment used: Rolling walker (2 wheels) Transfers: Sit to/from Stand, Bed to chair/wheelchair/BSC Sit to Stand: Contact guard assist   Step pivot transfers: Contact guard assist       General transfer comment: slightly labored movement with good return for transferring to chair    Ambulation/Gait Ambulation/Gait assistance: Contact guard assist Gait Distance (Feet): 65 Feet Assistive device: Rolling walker (2 wheels) Gait Pattern/deviations: Decreased step length - right, Decreased step length - left, Decreased stride length Gait velocity: decreased     General Gait Details: slightly labored movement with good return for ambulating in room, hallway without loss of balance, limited mostly due to fatigue  Stairs            Wheelchair Mobility     Tilt Bed    Modified Rankin (Stroke Patients Only)       Balance Overall balance assessment: Needs assistance Sitting-balance support: Feet supported, No upper extremity supported Sitting balance-Leahy Scale: Good Sitting balance - Comments: seated at EOB   Standing balance support: During functional activity, Bilateral upper extremity supported Standing balance-Leahy Scale: Fair Standing balance comment: using RW                             Pertinent Vitals/Pain Pain Assessment Pain Assessment: No/denies pain    Home Living Family/patient expects to be discharged to:: Private residence Living Arrangements: Alone Available Help at  Discharge: Family;Available PRN/intermittently Type of Home: House Home Access: Stairs to enter Entrance Stairs-Rails: Right;Left;Can reach both Entrance  Stairs-Number of Steps: 2   Home Layout: One level Home Equipment: Cane - single point;Hand held shower head;Grab bars - tub/shower;Shower seat      Prior Function Prior Level of Function : Needs assist;Driving       Physical Assist : ADLs (physical);Mobility (physical) Mobility (physical): Bed mobility;Transfers;Gait;Stairs   Mobility Comments: Pt reports ambulation with RW or SPC. ADLs Comments: Independent ADL; assist IADL's.     Extremity/Trunk Assessment   Upper Extremity Assessment Upper Extremity Assessment: Defer to OT evaluation    Lower Extremity Assessment Lower Extremity Assessment: Generalized weakness    Cervical / Trunk Assessment Cervical / Trunk Assessment: Normal  Communication   Communication Communication: Impaired Factors Affecting Communication: Reduced clarity of speech    Cognition Arousal: Alert Behavior During Therapy: WFL for tasks assessed/performed   PT - Cognitive impairments: No apparent impairments                         Following commands: Intact       Cueing Cueing Techniques: Verbal cues     General Comments      Exercises     Assessment/Plan    PT Assessment Patient needs continued PT services  PT Problem List Decreased strength;Decreased activity tolerance;Decreased balance;Decreased mobility       PT Treatment Interventions DME instruction;Gait training;Stair training;Functional mobility training;Therapeutic activities;Therapeutic exercise;Balance training;Patient/family education    PT Goals (Current goals can be found in the Care Plan section)  Acute Rehab PT Goals Patient Stated Goal: return home with family to assist PT Goal Formulation: With patient Time For Goal Achievement: 12/04/23 Potential to Achieve Goals: Good    Frequency Min 3X/week     Co-evaluation PT/OT/SLP Co-Evaluation/Treatment: Yes Reason for Co-Treatment: To address functional/ADL transfers PT goals addressed during  session: Mobility/safety with mobility;Balance;Proper use of DME         AM-PAC PT 6 Clicks Mobility  Outcome Measure Help needed turning from your back to your side while in a flat bed without using bedrails?: None Help needed moving from lying on your back to sitting on the side of a flat bed without using bedrails?: None Help needed moving to and from a bed to a chair (including a wheelchair)?: A Little Help needed standing up from a chair using your arms (e.g., wheelchair or bedside chair)?: A Little Help needed to walk in hospital room?: A Little Help needed climbing 3-5 steps with a railing? : A Little 6 Click Score: 20    End of Session   Activity Tolerance: Patient tolerated treatment well;Patient limited by fatigue Patient left: in chair;with call bell/phone within reach Nurse Communication: Mobility status PT Visit Diagnosis: Unsteadiness on feet (R26.81);Other abnormalities of gait and mobility (R26.89);Muscle weakness (generalized) (M62.81)    Time: 8984-8961 PT Time Calculation (min) (ACUTE ONLY): 23 min   Charges:   PT Evaluation $PT Eval Moderate Complexity: 1 Mod PT Treatments $Therapeutic Activity: 23-37 mins PT General Charges $$ ACUTE PT VISIT: 1 Visit         12:06 PM, 12/01/23 Lynwood Music, MPT Physical Therapist with [facility] [phone] office [phone] mobile phone

## 2023-12-01 NOTE — Progress Notes (Signed)
 Occupational Therapy Treatment Patient Details Name: [name] MRN: [id number] DOB: October 01, 1950 Today's Date: 12/01/2023   History of present illness [name] is a 73 y.o. 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  Patient is currently on Macrobid due to chronic UTIs   OT comments  Pt agreeable to OT treatment and PT evaluation. Pt demonstrated same level of assist for bed mobility and mod I. Able to dress B LE with mod I as well. CGA for ambulation and EOB to chair transfer with BP still registering as low, but not as low as yesterday. Pt using RW throughout the session. Pt left in the chair with with call bell within reach. Pt will benefit from continued OT in the hospital to increase strength, balance, and endurance for safe ADL's.           If plan is discharge home, recommend the following:  A little help with walking and/or transfers;Assist for transportation;Help with stairs or ramp for entrance   Equipment Recommendations  None recommended by OT          Precautions / Restrictions Precautions Precautions: Fall Recall of Precautions/Restrictions: Intact Restrictions Weight Bearing Restrictions Per Provider Order: No       Mobility Bed Mobility Overal bed mobility: Modified Independent             General bed mobility comments: mildly labored    Transfers Overall transfer level: Needs assistance Equipment used: Rolling walker (2 wheels) Transfers: Sit to/from Stand, Bed to chair/wheelchair/BSC Sit to Stand: Contact guard assist     Step pivot  transfers: Contact guard assist     General transfer comment: EOB to chair with RW     Balance Overall balance assessment: Needs assistance Sitting-balance support: Feet supported, No upper extremity supported Sitting balance-Leahy Scale: Good Sitting balance - Comments: seated at EOB   Standing balance support: During functional activity, Bilateral upper extremity supported Standing balance-Leahy Scale: Fair Standing balance comment: using RW                           ADL either performed or assessed with clinical judgement   ADL Overall ADL's : Needs assistance/impaired                     Lower Body Dressing: Modified independent;Sitting/lateral leans Lower Body Dressing Details (indicate cue type and reason): Mild extended time to don socks seated at EOB. Toilet Transfer: Contact guard assist;Stand-pivot;Ambulation;Rolling walker (2 wheels) Toilet Transfer Details (indicate cue type and reason): Simulated via EOB to chair and ambulation in the room/hall.         Functional mobility during ADLs: Contact guard assist;Rolling walker (2 wheels) General ADL Comments: Able to ambulate in the hall over 100 feet total with RW.    Extremity/Trunk Assessment Upper Extremity Assessment Upper Extremity Assessment: Defer to OT evaluation   Lower Extremity Assessment Lower Extremity Assessment: Generalized weakness   Cervical / Trunk Assessment Cervical / Trunk Assessment: Normal                     Communication Communication Communication: Impaired Factors Affecting  Communication: Reduced clarity of speech   Cognition Arousal: Alert Behavior During Therapy: WFL for tasks assessed/performed Cognition: No apparent impairments                               Following commands: Intact        Cueing   Cueing Techniques: Verbal cues               General Comments Orthostatics taken during the session. Supine 92/59 with pulse of  74. Seated 94/57 (88 pulse). Standing 90/62 (95 pulse). Taken again at the end of session witht he pt seated in the recliner 86/63 (91 pulse).    Pertinent Vitals/ Pain       Pain Assessment Pain Assessment: No/denies pain  Home Living Family/patient expects to be discharged to:: Private residence Living Arrangements: Alone Available Help at Discharge: Family;Available PRN/intermittently Type of Home: House Home Access: Stairs to enter [employer] of Steps: 2 Entrance Stairs-Rails: Right;Left;Can reach both Home Layout: One level     Bathroom Shower/Tub: Producer, Television/film/video: Handicapped height Bathroom Accessibility: Yes   Home Equipment: Cane - single point;Hand held shower head;Grab bars - tub/shower;Shower seat                        Frequency  Min 1X/week        Progress Toward Goals  OT Goals(current goals can now be found in the care plan section)  Progress towards OT goals: Progressing toward goals  Acute Rehab OT Goals Patient Stated Goal: return home OT Goal Formulation: With patient Time For Goal Achievement: 12/14/23 Potential to Achieve Goals: Good ADL Goals Pt Will Perform Grooming: with modified independence;standing Pt Will Transfer to Toilet: with modified independence;ambulating  Plan      Co-evaluation    PT/OT/SLP Co-Evaluation/Treatment: Yes Reason for Co-Treatment: To address functional/ADL transfers PT goals addressed during session: Mobility/safety with mobility;Balance;Proper use of DME OT goals addressed during session: ADL's and self-care                         End of Session Equipment Utilized During Treatment: Rolling walker (2 wheels)  OT Visit Diagnosis: Unsteadiness on feet (R26.81);Other abnormalities of gait and mobility (R26.89)   Activity Tolerance Patient tolerated treatment well   Patient Left in chair;with call bell/phone within reach   Nurse Communication           Time: 8980-8961 OT Time Calculation (min): 19 min  Charges: OT General Charges $OT Visit: 1 Visit OT Treatments $Therapeutic Activity: 8-22 mins  Vadim Centola OT, MOT   Jayson Person 12/01/2023, 2:38 PM

## 2023-12-01 NOTE — Progress Notes (Signed)
 PROGRESS NOTE    [name]  FMW:[id number]  DOB: [birth year]  DOA: 11/29/2023 PCP: Marvine Rush, MD Outpatient Specialists:   Hospital course:  73 y.o. 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   He is currently being treated for acute urinary tract infection with intravenous antibiotics.  Follow-up urine cultures and blood cultures and adjust antibiotics accordingly. He is from home, lives by himself and has a walker, cane as well as oxygen  concentrator at home.   Subjective:  Patient states he feels better, has been eating and drinking without difficulty.  His main concern however is concern for neuropathy in his feet and difficulty ambulating.     Objective: Vitals:   11/30/23 1400 11/30/23 1513 11/30/23 2022 12/01/23 0509  BP: (!) 88/50 110/72 101/62 133/84  Pulse:   73 72  Resp:   16 18  Temp:   97.8 F (36.6 C) 98 F (36.7 C)  TempSrc:   Oral Oral  SpO2:   96% 94%  Weight:      Height:        Intake/Output Summary (Last 24 hours) at 12/01/2023 1108 Last data filed at 12/01/2023 1000 Gross per 24 hour  Intake 820 ml  Output 2400 ml  Net -1580 ml   Filed Weights   11/29/23 1442  Weight: 73.9 kg     Exam:  General: Patient sitting up in bed in NAD, speaking in full sentences but with muffled voice Eyes: sclera anicteric, conjuctiva mild injection bilaterally CVS: S1-S2, regular  Respiratory:  decreased air entry bilaterally secondary to decreased inspiratory effort, rales at bases  GI: NABS, soft, NT  LE: No edema  Data Reviewed:  Basic Metabolic Panel: Recent Labs  Lab  11/29/23 1521 11/30/23 0442  NA 139 141  K 4.9 4.1  CL 100 105  CO2 33* 32  GLUCOSE 95 84  BUN 29* 23  CREATININE 1.14 0.87  CALCIUM  9.5 8.5*  MG  --  2.2  PHOS  --  2.5    CBC: Recent Labs  Lab 11/29/23 1545 11/30/23 0442  WBC 6.5 4.9  HGB 12.2* 10.6*  HCT 38.0* 33.1*  MCV 94.8 94.0  PLT 185 165     Scheduled Meds:  alprazolam   2 mg Oral QHS   citalopram   40 mg Oral Daily   enoxaparin  (LOVENOX ) injection  40 mg Subcutaneous Q24H   feeding supplement  237 mL Oral BID BM   fosfomycin  3 g Oral Once   mirtazapine  7.5 mg Oral QHS   pneumococcal 20-valent conjugate vaccine  0.5 mL Intramuscular Tomorrow-1000   rOPINIRole   3 mg Oral QHS   rosuvastatin   10 mg Oral Daily   Continuous Infusions:   Assessment & Plan:   Nausea and vomiting UTI Thought to be secondary to urinary tract infection Patient has been treated with hydration and antibiotics with resolution of symptoms Of note however patient's urine is growing out E. coli that is resistant to the ceftriaxone  that he has been getting. Patient's urines have been persistently abnormal with a ESBL Unclear if UTI is related to the nausea and vomiting Will discontinue ceftriaxone  Trial of fosfomycin to 3 g x 1 If patient remains well tomorrow  would discharge home off any further antibiotics  Neuropathy in feet Patient is awaiting PT evaluation to assess for falls and recommendations for discharge    Copied and pasted from previous note: Failure to thrive in adult Protein supplement will be provided Dietitian consulted recommendations Continue fall precaution Continue PT/OT eval and treat   History of dysphagia with recurrent aspiration pneumonia Continue aspiration precaution Speech therapy evaluation ordered   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 Patient does not follow up with any Oncologist.  He states that  he completed his treatment   Mixed hyperlipidemia Continue Crestor    Anxiety/mood disorder Continue Xanax , citalopram    RLS Continue ropinirole    Generalized weakness and physical deconditioning Continue fall precaution Continue PT/OT eval and treat   Goals of care Palliative care consulted  DVT prophylaxis: Lovenox  Code Status: DNR limited Family Communication: None today     Studies: CT Chest Wo Contrast Result Date: 11/29/2023 EXAM: CT CHEST WITHOUT CONTRAST 11/29/2023 08:20:23 PM TECHNIQUE: CT of the chest was performed without the administration of intravenous contrast. Multiplanar reformatted images are provided for review. Automated exposure control, iterative reconstruction, and/or weight based adjustment of the mA/kV was utilized to reduce the radiation dose to as low as reasonably achievable. COMPARISON: None available. CLINICAL HISTORY: Respiratory illness, nondiagnostic xray. FINDINGS: MEDIASTINUM: Heart demonstrates 4-vessel coronary artery calcifications. Pericardium is unremarkable. The central airways are clear. Mild atherosclerotic plaque of the aorta. Enlarged left thyroid  gland with underlying well-calcified hyperdense nodule measuring up to 2 cm. LYMPH NODES: No mediastinal, hilar or axillary lymphadenopathy. No gross hilar adenopathy with limited evaluation on this noncontrast study. LUNGS AND PLEURA: Biapical pleural/pulmonary scarring. Bilateral lower lobe, basilar predominant, reticulations as well as peribronchovascular ground-glass airspace opacities and micronodules. Subsolid 5 mm right lower lobe pulmonary nodule (4.101). No pulmonary edema. No pleural effusion or pneumothorax. SOFT TISSUES/BONES: No acute abnormality of the bones or soft tissues. UPPER ABDOMEN: Limited images of the upper abdomen demonstrates gallstones within the gallbladder lumen and a small hiatal hernia. IMPRESSION: 1. Bilateral lower lobe basilar-predominant reticulations with  peribronchovascular ground-glass opacities and micronodules, suggest further clinical and imaging evaluation for interstitial lung disease or inflammatory process as indicated. 2. Subsolid 5 mm right lower lobe pulmonary nodule; as a subsolid nodule <6 mm, no routine follow-up is recommended per Fleischner Society Guidelines. 3. Enlarged left thyroid  with a well-calcified 2 cm nodule; in patients 35 years, recommend non-emergent thyroid  ultrasound. Electronically signed by: Morgane Naveau MD 11/29/2023 08:46 PM EST RP Workstation: [id number]   CT ABDOMEN PELVIS W CONTRAST Result Date: 11/29/2023 EXAM: CT ABDOMEN AND PELVIS WITH CONTRAST 11/29/2023 05:54:33 PM TECHNIQUE: CT of the abdomen and pelvis was performed with the administration of 100 mL of iohexol  (OMNIPAQUE ) 300 MG/ML solution. Multiplanar reformatted images are provided for review. Automated exposure control, iterative reconstruction, and/or weight-based adjustment of the mA/kV was utilized to reduce the radiation dose to as low as reasonably achievable. COMPARISON: Prior study 03/25/2023. CLINICAL HISTORY: Abdominal pain, acute, nonlocalized. FINDINGS: LOWER CHEST: Bibasilar interstitial prominence and nodularity suggesting atypical infectious process or inflammation. LIVER: The liver is unremarkable. GALLBLADDER AND BILE DUCTS: Numerous gallstones. No biliary ductal dilatation. SPLEEN: No acute abnormality. PANCREAS: No acute abnormality. ADRENAL GLANDS: No acute abnormality. KIDNEYS, URETERS AND BLADDER: No stones in the kidneys or ureters. No hydronephrosis. No perinephric or periureteral stranding. Numerous large calcified bladder stones. GI AND BOWEL: Small hiatal hernia. Stomach demonstrates no acute abnormality.  There is no bowel obstruction. PERITONEUM AND RETROPERITONEUM: No ascites. No free air. VASCULATURE: Aorta is normal in caliber. LYMPH NODES: No lymphadenopathy. REPRODUCTIVE ORGANS: No acute abnormality. BONES AND SOFT TISSUES:  Right hip prosthesis with artifact degrading images of the pelvis. No acute osseous abnormality. No focal soft tissue abnormality. IMPRESSION: 1. Numerous gallstones 2. Numerous large calcified bladder stones 3. Small hiatal hernia 4. Bibasilar interstitial prominence and nodularity suggesting atypical infectious process or inflammation; recommend clinical correlation and dedicated chest CT if indicated for further evaluation Electronically signed by: Fonda Field MD 11/29/2023 06:05 PM EST RP Workstation: [id number]   CT Head Wo Contrast Result Date: 11/29/2023 EXAM: CT HEAD WITHOUT CONTRAST 11/29/2023 05:54:33 PM TECHNIQUE: CT of the head was performed without the administration of intravenous contrast. Automated exposure control, iterative reconstruction, and/or weight based adjustment of the mA/kV was utilized to reduce the radiation dose to as low as reasonably achievable. COMPARISON: 05/11/2023 CLINICAL HISTORY: Nausea and vomiting. Myoclonus. FINDINGS: BRAIN AND VENTRICLES: Periventricular and subcortical white matter hypoattenuation is mildly advanced for age. Minimal atherosclerotic changes are present within the cavernous internal carotid arteries bilaterally. No hyperdense vessel is present. No acute hemorrhage. No evidence of acute infarct. No hydrocephalus. No extra-axial collection. No mass effect or midline shift. ORBITS: No acute abnormality. SINUSES: No acute abnormality. SOFT TISSUES AND SKULL: No acute soft tissue abnormality. No skull fracture. IMPRESSION: 1. No acute intracranial abnormality. 2. Mildly advanced periventricular and subcortical white matter hypoattenuation for age. Electronically signed by: Lonni Necessary MD 11/29/2023 05:59 PM EST RP Workstation: [id number]   DG Chest Port 1 View Result Date: 11/29/2023 EXAM: 1 VIEW(S) XRAY OF THE CHEST 11/29/2023 04:16:27 PM COMPARISON: 06/09/2023. CLINICAL HISTORY: weakness FINDINGS: LINES, TUBES AND DEVICES: Right side  port-a-cath tip mid SVC. LUNGS AND PLEURA: Hyperinflated lungs consistent with COPD. No pleural effusion. No pneumothorax. HEART AND MEDIASTINUM: Right side port-a-cath tip mid SVC. No acute abnormality of the cardiac and mediastinal silhouettes. BONES AND SOFT TISSUES: No acute osseous abnormality. IMPRESSION: 1. Hyperinflated lungs consistent with COPD. 2. No acute cardiopulmonary process Electronically signed by: Fonda Field MD 11/29/2023 04:35 PM EST RP Workstation: [id number]    Principal Problem:   UTI (urinary tract infection)     Ivan Vangie Pike, [facility]  If 7PM-7AM, please contact night-coverage [email or web address]   LOS: 1 day

## 2023-12-01 NOTE — Plan of Care (Signed)

## 2023-12-01 NOTE — Plan of Care (Signed)
  Problem: Acute Rehab PT Goals(only PT should resolve) Goal: Pt Will Go Supine/Side To Sit Outcome: Progressing Flowsheets (Taken 12/01/2023 1207) Pt will go Supine/Side to Sit: Independently Goal: Patient Will Transfer Sit To/From Stand Outcome: Progressing Flowsheets (Taken 12/01/2023 1207) Patient will transfer sit to/from stand:  with modified independence  with supervision Goal: Pt Will Transfer Bed To Chair/Chair To Bed Outcome: Progressing Flowsheets (Taken 12/01/2023 1207) Pt will Transfer Bed to Chair/Chair to Bed:  with modified independence  with supervision Goal: Pt Will Ambulate Outcome: Progressing Flowsheets (Taken 12/01/2023 1207) Pt will Ambulate:  100 feet  with modified independence  with supervision  with rolling walker   12:08 PM, 12/01/23 Lynwood Music, MPT Physical Therapist with [facility] [phone] office [phone] mobile phone

## 2023-12-02 DIAGNOSIS — R112 Nausea with vomiting, unspecified: Secondary | ICD-10-CM | POA: Diagnosis not present

## 2023-12-02 LAB — CBC
HCT: 34.7 % — ABNORMAL LOW (ref 39.0–52.0)
Hemoglobin: 11.4 g/dL — ABNORMAL LOW (ref 13.0–17.0)
MCH: 30.1 pg (ref 26.0–34.0)
MCHC: 32.9 g/dL (ref 30.0–36.0)
MCV: 91.6 fL (ref 80.0–100.0)
Platelets: 164 K/uL (ref 150–400)
RBC: 3.79 MIL/uL — ABNORMAL LOW (ref 4.22–5.81)
RDW: 14.7 % (ref 11.5–15.5)
WBC: 4.8 K/uL (ref 4.0–10.5)
nRBC: 0 % (ref 0.0–0.2)

## 2023-12-02 MED ORDER — MIRTAZAPINE 7.5 MG PO TABS: 7.5000 mg | ORAL_TABLET | Freq: Every day | ORAL | 0 refills | Status: AC

## 2023-12-02 MED ORDER — ONDANSETRON HCL 4 MG PO TABS: 4.0000 mg | ORAL_TABLET | Freq: Four times a day (QID) | ORAL | 0 refills | Status: AC | PRN

## 2023-12-02 NOTE — Progress Notes (Signed)
 Called pt's granddaughter Cristino Dickens to inform of discharge order and to arrange transportation. No answer at number provided in chart, voice mail left requesting call back.

## 2023-12-02 NOTE — TOC Transition Note (Signed)
 Transition of Care [facility]) - Discharge Note   Patient Details  Name: [name] MRN: [id number] Date of Birth: [birth year]  [facility] CM/SW Contact:  Hoy DELENA Bigness, LCSW Phone Number: 12/02/2023, 10:58 AM   Clinical Narrative:    HHPT/OT arranged with [facility]. HH orders to be placed prior to discharge.   Final next level of care: Home w Home Health Services Barriers to Discharge: Barriers Resolved   Patient Goals and CMS Choice Patient states their goals for this hospitalization and ongoing recovery are:: return back home CMS Medicare.gov Compare Post Acute Care list provided to:: Patient Represenative (must comment) ([place]- granddaughter) Choice offered to / list presented to : Patient      Discharge Placement                       Discharge Plan and Services Additional resources added to the After Visit Summary for       Post Acute Care Choice: Durable Medical Equipment          DME Arranged: N/A DME Agency: NA       HH Arranged: PT, OT HH Agency: [facility] ([facility] Date HH Agency Contacted: 12/02/23 Time HH Agency Contacted: 1058 Representative spoke with at [facility] Agency: Contractor  Social Drivers of Health (SDOH) Interventions SDOH Screenings   Food Insecurity: No Food Insecurity (11/29/2023)  Housing: High Risk (11/29/2023)  Transportation Needs: No Transportation Needs (11/29/2023)  Utilities: Not At Risk (11/29/2023)  Social Connections: Socially Isolated (11/29/2023)  Tobacco Use: Low Risk  (11/29/2023)     Readmission Risk Interventions    12/02/2023   10:57 AM 11/30/2023    3:34 PM 11/30/2023    8:27 AM  Readmission Risk Prevention Plan  Transportation Screening Complete Complete Complete  PCP or Specialist Appt within 3-5 Days Complete    HRI or Home Care Consult Complete Complete Complete  Social Work Consult for Recovery Care Planning/Counseling Complete Complete Complete  Palliative Care Screening  Not Applicable Not Applicable Not Applicable  Medication Review Oceanographer) Complete Complete Complete

## 2023-12-02 NOTE — Plan of Care (Signed)

## 2023-12-02 NOTE — Discharge Summary (Addendum)
 [name]:[id number] DOB: 1950-05-01 DOA: 11/29/2023  PCP: Marvine Rush, MD  Admit date: 11/29/2023  Discharge date: 12/02/2023  Admitted From: Home   disposition: Home   Recommendations for Outpatient Follow-up:   Follow up with PCP in 1-2 weeks   Home Health: Home health OT and PT Equipment/Devices: Nothing new, has walker, cane and oxygen  concentrator at home Consultations: Palliative care Discharge Condition: Improved CODE STATUS: DNR limited Diet Recommendation: Dysphagia 3 diet with thin liquids  Diet Order             DIET DYS 3 Fluid consistency: Thin  Diet effective now                    Chief Complaint  Patient presents with   Nausea   Emesis     Brief history of present illness from the day of admission and additional interim summary     [name] is a 73 y.o. 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  Patient is currently on Macrobid due to chronic UTIs   ED course In the emergency department, BP was 126/59 and other vital signs were within normal range.  Workup in ED showed normocytic anemia, normal BMP except for bicarb of 23 and BUN of 29.  Lipase 102, lactic acid 0.9, urinalysis was suggestive of UTI.  Blood culture pending CT abdomen and pelvis showed numerous gallstones, numerous large calcified bladder stones, small hiatal hernia and bibasilar interstitial  prominence and nodularity suggesting atypical infectious process or inflammation CT chest without contrast was suggestive of interstitial lung disease or inflammatory process.  Subsolid 5 mm RLL pulmonary nodule noted. Chest x-ray showed no acute cardiopulmonary process CT head shows no acute intracranial abnormality Patient was treated with IV ceftriaxone , Zofran  was given and IV hydration was provided.                                                                  Hospital Course   Patient's nausea and vomited was attributed to his UTI given markedly abnormal UA and he was started on ceftriaxone .  He was also treated with IV fluid resuscitation and antinausea medication.  Patient did well with resolution of nausea and vomiting and was able to tolerate his usual dysphagia to diet without difficulty.  At that point urine culture came back with ESBL E. coli that is resistant to ceftriaxone .  Thus it was most likely that patient's nausea and vomiting was not  secondary to UTI but idiopathic which improved with conservative management.  Patient was treated with fosfomycin 3 g x 1 which she tolerated well.  The following day he continued to feel well continue to eat and drink without difficulty and was discharged home.  Patient was evaluated by PT and OT who have recommended home health PT and OT for him which was ordered.  Patient was also seen by palliative care and with involvement of his granddaughter [place], he decided to sign DNR form.  He also noted that he had no desire for tube feeding although that may be reconsidered in the future.  Patient to be discharged home with MOST form and follow-up with oncology and palliative care per their recommendations.     Refractory nausea and vomiting Initially thought to be secondary to UTI but resolved with conservative management.   As noted below empiric therapy for UTI was ineffective even as his nausea and vomiting resolved.  Persistently abnormal  UA ESBL E. coli Patient empirically treated with ceftriaxone  however urine grew out ESBL E. coli resistant to ceftriaxone . Patient was treated with 3 g fosfomycin x 1 He is on nitrofurantoin per his PCP, this can be addressed by outpatient providers as warranted  Neuropathy in feet Home health PT and per PT evaluation recommendations   Metastatic tonsillar squamous cell carcinoma Dysphagia Failure to thrive Mets to lung, s/p chemoradiation previously NED in '19; lost to f/u with oncology. Patient does not follow up with any Oncologist.  He states that he completed his treatment On dysphagia 2 diet Continue protein supplementation Palliative care started patient on Remeron to induce appetite and to help with sleep   Mixed hyperlipidemia Continue Crestor    Anxiety/mood disorder Continue Xanax , citalopram  As noted above, Remeron was added by palliative care   RLS Continue ropinirole     Goals of care After discussion with palliative care, patient decided to sign MOST form and is DNR limited.  At this point he said he would not like tube feeds however he may revisit that decision at a later date.   Discharge diagnosis     Principal Problem:   UTI (urinary tract infection)    Discharge instructions    Discharge Instructions     Discharge instructions   Complete by: As directed    You have been started on mirtazapine to help stimulate your appetite.  This will also help you sleep at night if you take it at bedtime.  I have given you a 1 month supply, if you find it to be helpful please ask your PCP or oncologist for refills on this medication.  You can take ondansetron  if you get nauseated again.  It is important to not get dehydrated because that causes your nausea to be worse.   Increase activity slowly   Complete by: As directed        Discharge Medications   Allergies as of 12/02/2023       Reactions   Neurontin [gabapentin] Other (See Comments)   Causes  seizures   Buspirone Other (See Comments)   Lips swelling   Dilaudid  [hydromorphone  Hcl] Other (See Comments)   Oxygen  saturation drops        Medication List     TAKE these medications    acetaminophen  500 MG tablet Commonly known as: TYLENOL  Take 2 tablets (1,000 mg total) by mouth every 6 (six) hours as needed. What changed: reasons to take this   albuterol  (2.5 MG/3ML) 0.083% nebulizer solution Commonly known  as: PROVENTIL  Take 3 mLs (2.5 mg total) by nebulization every 4 (four) hours as needed for wheezing or shortness of breath.   alprazolam  2 MG tablet Commonly known as: XANAX  Take 2 mg by mouth 2 (two) times daily as needed for anxiety.   citalopram  40 MG tablet Commonly known as: CELEXA  Take 1 tablet (40 mg total) by mouth daily.   mirtazapine 7.5 MG tablet Commonly known as: REMERON Take 1 tablet (7.5 mg total) by mouth at bedtime.   nitrofurantoin (macrocrystal-monohydrate) 100 MG capsule Commonly known as: MACROBID Take 100 mg by mouth 2 (two) times daily.   ondansetron  4 MG tablet Commonly known as: ZOFRAN  Take 1 tablet (4 mg total) by mouth every 6 (six) hours as needed for nausea.   OXYGEN  Inhale 2 L into the lungs continuous.   rOPINIRole  3 MG tablet Commonly known as: REQUIP  Take 3 mg by mouth at bedtime.   rosuvastatin  10 MG tablet Commonly known as: CRESTOR  Take 10 mg by mouth daily.   traMADol  50 MG tablet Commonly known as: ULTRAM  Take 1 tablet (50 mg total) by mouth every 8 (eight) hours as needed (moderate to severe pain).         Contact information for after-discharge care     Home Medical Care     [facility]) .   Service: Home Health Services Contact information: [phone] [place]  [phone]                     Major procedures and Radiology Reports - PLEASE review detailed and final reports thoroughly  -       CT Chest Wo Contrast Result Date:  11/29/2023 EXAM: CT CHEST WITHOUT CONTRAST 11/29/2023 08:20:23 PM TECHNIQUE: CT of the chest was performed without the administration of intravenous contrast. Multiplanar reformatted images are provided for review. Automated exposure control, iterative reconstruction, and/or weight based adjustment of the mA/kV was utilized to reduce the radiation dose to as low as reasonably achievable. COMPARISON: None available. CLINICAL HISTORY: Respiratory illness, nondiagnostic xray. FINDINGS: MEDIASTINUM: Heart demonstrates 4-vessel coronary artery calcifications. Pericardium is unremarkable. The central airways are clear. Mild atherosclerotic plaque of the aorta. Enlarged left thyroid  gland with underlying well-calcified hyperdense nodule measuring up to 2 cm. LYMPH NODES: No mediastinal, hilar or axillary lymphadenopathy. No gross hilar adenopathy with limited evaluation on this noncontrast study. LUNGS AND PLEURA: Biapical pleural/pulmonary scarring. Bilateral lower lobe, basilar predominant, reticulations as well as peribronchovascular ground-glass airspace opacities and micronodules. Subsolid 5 mm right lower lobe pulmonary nodule (4.101). No pulmonary edema. No pleural effusion or pneumothorax. SOFT TISSUES/BONES: No acute abnormality of the bones or soft tissues. UPPER ABDOMEN: Limited images of the upper abdomen demonstrates gallstones within the gallbladder lumen and a small hiatal hernia. IMPRESSION: 1. Bilateral lower lobe basilar-predominant reticulations with peribronchovascular ground-glass opacities and micronodules, suggest further clinical and imaging evaluation for interstitial lung disease or inflammatory process as indicated. 2. Subsolid 5 mm right lower lobe pulmonary nodule; as a subsolid nodule <6 mm, no routine follow-up is recommended per Fleischner Society Guidelines. 3. Enlarged left thyroid  with a well-calcified 2 cm nodule; in patients 35 years, recommend non-emergent thyroid  ultrasound.  Electronically signed by: Morgane Naveau MD 11/29/2023 08:46 PM EST RP Workstation: [id number]   CT ABDOMEN PELVIS W CONTRAST Result Date: 11/29/2023 EXAM: CT ABDOMEN AND PELVIS WITH CONTRAST 11/29/2023 05:54:33 PM TECHNIQUE: CT of the abdomen and pelvis was performed with the administration of 100 mL  of iohexol  (OMNIPAQUE ) 300 MG/ML solution. Multiplanar reformatted images are provided for review. Automated exposure control, iterative reconstruction, and/or weight-based adjustment of the mA/kV was utilized to reduce the radiation dose to as low as reasonably achievable. COMPARISON: Prior study 03/25/2023. CLINICAL HISTORY: Abdominal pain, acute, nonlocalized. FINDINGS: LOWER CHEST: Bibasilar interstitial prominence and nodularity suggesting atypical infectious process or inflammation. LIVER: The liver is unremarkable. GALLBLADDER AND BILE DUCTS: Numerous gallstones. No biliary ductal dilatation. SPLEEN: No acute abnormality. PANCREAS: No acute abnormality. ADRENAL GLANDS: No acute abnormality. KIDNEYS, URETERS AND BLADDER: No stones in the kidneys or ureters. No hydronephrosis. No perinephric or periureteral stranding. Numerous large calcified bladder stones. GI AND BOWEL: Small hiatal hernia. Stomach demonstrates no acute abnormality. There is no bowel obstruction. PERITONEUM AND RETROPERITONEUM: No ascites. No free air. VASCULATURE: Aorta is normal in caliber. LYMPH NODES: No lymphadenopathy. REPRODUCTIVE ORGANS: No acute abnormality. BONES AND SOFT TISSUES: Right hip prosthesis with artifact degrading images of the pelvis. No acute osseous abnormality. No focal soft tissue abnormality. IMPRESSION: 1. Numerous gallstones 2. Numerous large calcified bladder stones 3. Small hiatal hernia 4. Bibasilar interstitial prominence and nodularity suggesting atypical infectious process or inflammation; recommend clinical correlation and dedicated chest CT if indicated for further evaluation Electronically signed by:  Fonda Field MD 11/29/2023 06:05 PM EST RP Workstation: [id number]   CT Head Wo Contrast Result Date: 11/29/2023 EXAM: CT HEAD WITHOUT CONTRAST 11/29/2023 05:54:33 PM TECHNIQUE: CT of the head was performed without the administration of intravenous contrast. Automated exposure control, iterative reconstruction, and/or weight based adjustment of the mA/kV was utilized to reduce the radiation dose to as low as reasonably achievable. COMPARISON: 05/11/2023 CLINICAL HISTORY: Nausea and vomiting. Myoclonus. FINDINGS: BRAIN AND VENTRICLES: Periventricular and subcortical white matter hypoattenuation is mildly advanced for age. Minimal atherosclerotic changes are present within the cavernous internal carotid arteries bilaterally. No hyperdense vessel is present. No acute hemorrhage. No evidence of acute infarct. No hydrocephalus. No extra-axial collection. No mass effect or midline shift. ORBITS: No acute abnormality. SINUSES: No acute abnormality. SOFT TISSUES AND SKULL: No acute soft tissue abnormality. No skull fracture. IMPRESSION: 1. No acute intracranial abnormality. 2. Mildly advanced periventricular and subcortical white matter hypoattenuation for age. Electronically signed by: Lonni Necessary MD 11/29/2023 05:59 PM EST RP Workstation: [id number]   DG Chest Port 1 View Result Date: 11/29/2023 EXAM: 1 VIEW(S) XRAY OF THE CHEST 11/29/2023 04:16:27 PM COMPARISON: 06/09/2023. CLINICAL HISTORY: weakness FINDINGS: LINES, TUBES AND DEVICES: Right side port-a-cath tip mid SVC. LUNGS AND PLEURA: Hyperinflated lungs consistent with COPD. No pleural effusion. No pneumothorax. HEART AND MEDIASTINUM: Right side port-a-cath tip mid SVC. No acute abnormality of the cardiac and mediastinal silhouettes. BONES AND SOFT TISSUES: No acute osseous abnormality. IMPRESSION: 1. Hyperinflated lungs consistent with COPD. 2. No acute cardiopulmonary process Electronically signed by: Fonda Field MD 11/29/2023 04:35 PM EST  RP Workstation: [id number]    Micro Results    Recent Results (from the past 240 hours)  Culture, blood (routine x 2)     Status: None (Preliminary result)   Collection Time: 11/29/23  4:23 PM   Specimen: BLOOD  Result Value Ref Range Status   Specimen Description BLOOD BLOOD RIGHT FOREARM  Final   Special Requests   Final    AEROBIC BOTTLE ONLY Blood Culture results may not be optimal due to an inadequate volume of blood received in culture bottles   Culture   Final    NO GROWTH 3 DAYS Performed at [facility], [address],  [place], [place] [address]    Report Status PENDING  Incomplete  Culture, blood (routine x 2)     Status: None (Preliminary result)   Collection Time: 11/29/23  4:23 PM   Specimen: BLOOD  Result Value Ref Range Status   Specimen Description BLOOD BLOOD RIGHT HAND  Final   Special Requests   Final    BOTTLES DRAWN AEROBIC AND ANAEROBIC Blood Culture adequate volume   Culture   Final    NO GROWTH 3 DAYS Performed at [facility], [address], [place], [place] [address]    Report Status PENDING  Incomplete  Urine Culture     Status: Abnormal   Collection Time: 11/29/23  4:34 PM   Specimen: Urine, Catheterized  Result Value Ref Range Status   Specimen Description   Final    URINE, CATHETERIZED Performed at [facility], [address], [place], [place] [address]    Special Requests   Final    NONE Performed at [facility], [address] [place], [place] [address]    Culture (A)  Final    >=100,000 COLONIES/mL ESCHERICHIA COLI Confirmed Extended Spectrum Beta-Lactamase Producer (ESBL).  In bloodstream infections from ESBL organisms, carbapenems are preferred over piperacillin /tazobactam. They are shown to have a lower risk of mortality.    Report Status 12/01/2023 FINAL  Final   Organism ID, Bacteria ESCHERICHIA COLI (A)  Final      Susceptibility   Escherichia coli - MIC*    AMPICILLIN >=32 RESISTANT Resistant     CEFAZOLIN  (URINE) Value in  next row Resistant      >=32 RESISTANTThis is a modified FDA-approved test that has been validated and its performance characteristics determined by the reporting laboratory.  This laboratory is certified under the Clinical Laboratory Improvement Amendments CLIA as qualified to perform high complexity clinical laboratory testing.    CEFEPIME Value in next row Intermediate      >=32 RESISTANTThis is a modified FDA-approved test that has been validated and its performance characteristics determined by the reporting laboratory.  This laboratory is certified under the Clinical Laboratory Improvement Amendments CLIA as qualified to perform high complexity clinical laboratory testing.    ERTAPENEM Value in next row Sensitive      >=32 RESISTANTThis is a modified FDA-approved test that has been validated and its performance characteristics determined by the reporting laboratory.  This laboratory is certified under the Clinical Laboratory Improvement Amendments CLIA as qualified to perform high complexity clinical laboratory testing.    CEFTRIAXONE  Value in next row Resistant      >=32 RESISTANTThis is a modified FDA-approved test that has been validated and its performance characteristics determined by the reporting laboratory.  This laboratory is certified under the Clinical Laboratory Improvement Amendments CLIA as qualified to perform high complexity clinical laboratory testing.    CIPROFLOXACIN Value in next row Resistant      >=32 RESISTANTThis is a modified FDA-approved test that has been validated and its performance characteristics determined by the reporting laboratory.  This laboratory is certified under the Clinical Laboratory Improvement Amendments CLIA as qualified to perform high complexity clinical laboratory testing.    GENTAMICIN Value in next row Sensitive      >=32 RESISTANTThis is a modified FDA-approved test that has been validated and its performance characteristics determined by the  reporting laboratory.  This laboratory is certified under the Clinical Laboratory Improvement Amendments CLIA as qualified to perform high complexity clinical laboratory testing.    NITROFURANTOIN Value in next row  Resistant      >=32 RESISTANTThis is a modified FDA-approved test that has been validated and its performance characteristics determined by the reporting laboratory.  This laboratory is certified under the Clinical Laboratory Improvement Amendments CLIA as qualified to perform high complexity clinical laboratory testing.    TRIMETH/SULFA Value in next row Resistant      >=32 RESISTANTThis is a modified FDA-approved test that has been validated and its performance characteristics determined by the reporting laboratory.  This laboratory is certified under the Clinical Laboratory Improvement Amendments CLIA as qualified to perform high complexity clinical laboratory testing.    AMPICILLIN/SULBACTAM Value in next row Sensitive      >=32 RESISTANTThis is a modified FDA-approved test that has been validated and its performance characteristics determined by the reporting laboratory.  This laboratory is certified under the Clinical Laboratory Improvement Amendments CLIA as qualified to perform high complexity clinical laboratory testing.    PIP/TAZO Value in next row Sensitive      <=4 SENSITIVEThis is a modified FDA-approved test that has been validated and its performance characteristics determined by the reporting laboratory.  This laboratory is certified under the Clinical Laboratory Improvement Amendments CLIA as qualified to perform high complexity clinical laboratory testing.    MEROPENEM  Value in next row Sensitive      <=4 SENSITIVEThis is a modified FDA-approved test that has been validated and its performance characteristics determined by the reporting laboratory.  This laboratory is certified under the Clinical Laboratory Improvement Amendments CLIA as qualified to perform high complexity  clinical laboratory testing.    * >=100,000 COLONIES/mL ESCHERICHIA COLI    Today   Subjective    [name] feels much improved since admission.  Feels ready to go home.  Denies chest pain, shortness of breath or abdominal pain.  Feels they can take care of themselves with the resources they have at home.  Objective   Blood pressure 130/63, pulse 87, temperature 98.1 F (36.7 C), temperature source Oral, resp. rate 16, height 6' 4 (1.93 m), weight 73.9 kg, SpO2 96%.   Intake/Output Summary (Last 24 hours) at 12/02/2023 1156 Last data filed at 12/02/2023 0900 Gross per 24 hour  Intake 720 ml  Output 2500 ml  Net -1780 ml    Exam General: Patient appears well and in good spirits sitting up in bed in no acute distress.  Eyes: sclera anicteric, conjuctiva mild injection bilaterally CVS: S1-S2, regular  Respiratory:  decreased air entry bilaterally secondary to decreased inspiratory effort, rales at bases  GI: NABS, soft, NT  LE: No edema.  Neuro: A/O x 3, Moving all extremities equally with normal strength, CN 3-12 intact, grossly nonfocal.  Psych: patient is logical and coherent, judgement and insight appear normal, mood and affect appropriate to situation.    Data Review   CBC w Diff:  Lab Results  Component Value Date   WBC 4.8 12/02/2023   HGB 11.4 (L) 12/02/2023   HGB 13.9 12/16/2013   HCT 34.7 (L) 12/02/2023   HCT 42.4 12/16/2013   PLT 164 12/02/2023   PLT 167 12/16/2013   LYMPHOPCT 12 06/09/2023   LYMPHOPCT 11.3 (L) 12/16/2013   MONOPCT 11 06/09/2023   MONOPCT 9.9 12/16/2013   EOSPCT 4 06/09/2023   EOSPCT 3.5 12/16/2013   BASOPCT 1 06/09/2023   BASOPCT 0.9 12/16/2013    CMP:  Lab Results  Component Value Date   NA 141 11/30/2023   NA 142 12/16/2013   K 4.1 11/30/2023   K  4.7 12/16/2013   CL 105 11/30/2023   CO2 32 11/30/2023   CO2 32 (H) 12/16/2013   BUN 23 11/30/2023   BUN 16.6 12/16/2013   CREATININE 0.87 11/30/2023   CREATININE 0.8  12/16/2013   PROT 6.1 (L) 11/30/2023   PROT 8.0 12/16/2013   ALBUMIN 3.4 (L) 11/30/2023   ALBUMIN 4.0 12/16/2013   BILITOT 0.4 11/30/2023   BILITOT 0.45 12/16/2013   ALKPHOS 69 11/30/2023   ALKPHOS 84 12/16/2013   AST 17 11/30/2023   AST 20 12/16/2013   ALT 11 11/30/2023   ALT 13 12/16/2013  .   Total Time in preparing paper work, data evaluation and todays exam - 35 minutes  Ivan Vangie Pike M.D on 12/02/2023 at 11:56 AM  [facility]

## 2023-12-04 LAB — CULTURE, BLOOD (ROUTINE X 2)
Culture: NO GROWTH
Culture: NO GROWTH
Special Requests: ADEQUATE

## 2023-12-07 ENCOUNTER — Ambulatory Visit: Admitting: Urology

## 2023-12-07 VITALS — BP 113/72 | HR 94

## 2023-12-07 DIAGNOSIS — Z87440 Personal history of urinary (tract) infections: Secondary | ICD-10-CM

## 2023-12-07 DIAGNOSIS — N390 Urinary tract infection, site not specified: Secondary | ICD-10-CM

## 2023-12-07 DIAGNOSIS — R339 Retention of urine, unspecified: Secondary | ICD-10-CM

## 2023-12-07 DIAGNOSIS — N210 Calculus in bladder: Secondary | ICD-10-CM

## 2023-12-07 NOTE — Progress Notes (Unsigned)
 12/07/2023 3:02 PM   Larnell HERO Haacke Dec 18, 1950 [id number]  Referring provider: Marvine Rush, MD [address] [place],  [place] [address]  No chief complaint on file.   HPI: Mr Centola is a 72yo here for evaluation of recurrent UTI. He has been performing CIC for 4 years. Over the past year he has been hospitalized 5 times for a UTI.He performs CIC 3x per day. CT from 11/29/2023 shows numerous large bladder calculi. No gross hematuria. He has intermittent pain with performing CIC.    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 Ottelin   GASTROSTOMY TUBE PLACEMENT  04/04/2013   LAPAROSCOPIC GASTROSTOMY N/A 04/04/2013   Procedure: LAPAROSCOPIC GASTROSTOMY TUBE PLACEMENT ;  Surgeon: Lynda Leos, MD;  Location: [facility] OR;  Service: General;  Laterality: N/A;   MULTIPLE EXTRACTIONS WITH ALVEOLOPLASTY N/A 04/14/2013   Procedure: Extraction of tooth #'s 1,2,3,4,5,6,7,8,9,10,11,12,13,14,15,17,18,19,20,21,22,23,24,25,26,27,28,29, 30, 31, and 32 with alveoloplasty and bilateral mandibular tori reductions.;  Surgeon: Tanda JULIANNA Fanny, DDS;  Location: [facility] OR;  Service: Oral Surgery;  Laterality: N/A;   NASAL HEMORRHAGE CONTROL N/A 04/04/2013   Procedure: Control of oropharyngeal hemorrhage;   Surgeon: Ana LELON Moccasin, MD;  Location: [facility] OR;  Service: ENT;  Laterality: N/A;   PORTACATH PLACEMENT Right 04/04/2013   PORTACATH PLACEMENT N/A 04/04/2013   Procedure: INSERTION PORT-A-CATH;  Surgeon: Lynda Leos, MD;  Location: [facility] OR;  Service: General;  Laterality: N/A;   TOTAL HIP ARTHROPLASTY Right 2000   Dr. Beverley    Home Medications:  Allergies as of 12/07/2023       Reactions   Neurontin [gabapentin] Other (See Comments)   Causes seizures   Buspirone Other (See Comments)   Lips swelling   Dilaudid  [hydromorphone  Hcl] Other (See Comments)   Oxygen  saturation drops        Medication List        Accurate as of December 07, 2023  3:02 PM. If you have any questions, ask your nurse or doctor.          acetaminophen  500 MG tablet Commonly known as: TYLENOL  Take 2 tablets (1,000 mg total) by mouth every 6 (six) hours as needed. What changed: reasons to take this   albuterol  (2.5 MG/3ML) 0.083% nebulizer solution Commonly known as: PROVENTIL  Take 3 mLs (2.5 mg total) by nebulization every 4 (four) hours as needed for wheezing or shortness of breath.   alprazolam  2 MG tablet Commonly known as: XANAX  Take 2 mg by mouth 2 (two) times daily as needed for anxiety.   citalopram  40 MG tablet Commonly known as: CELEXA  Take 1 tablet (40 mg total) by mouth  daily.   mirtazapine  7.5 MG tablet Commonly known as: REMERON  Take 1 tablet (7.5 mg total) by mouth at bedtime.   nitrofurantoin (macrocrystal-monohydrate) 100 MG capsule Commonly known as: MACROBID Take 100 mg by mouth 2 (two) times daily.   ondansetron  4 MG tablet Commonly known as: ZOFRAN  Take 1 tablet (4 mg total) by mouth every 6 (six) hours as needed for nausea.   OXYGEN  Inhale 2 L into the lungs continuous.   rOPINIRole  3 MG tablet Commonly known as: REQUIP  Take 3 mg by mouth at bedtime.   rosuvastatin  10 MG tablet Commonly known as: CRESTOR  Take 10 mg by mouth daily.   traMADol  50 MG  tablet Commonly known as: ULTRAM  Take 1 tablet (50 mg total) by mouth every 8 (eight) hours as needed (moderate to severe pain).        Allergies:  Allergies  Allergen Reactions   Neurontin [Gabapentin] Other (See Comments)    Causes seizures   Buspirone Other (See Comments)    Lips swelling    Dilaudid  [Hydromorphone  Hcl] Other (See Comments)    Oxygen  saturation drops    Family History: Family History  Problem Relation Age of Onset   Cancer Cousin        living brain cancer, male   Cancer Mother        Deceased with leukemia, had uterine ca   Hyperlipidemia Brother     Social History:  reports that he has never smoked. He has never used smokeless tobacco. He reports that he does not currently use alcohol. He reports that he does not use drugs.  ROS: All other review of systems were reviewed and are negative except what is noted above in HPI  Physical Exam: BP 113/72   Pulse 94   Constitutional:  Alert and oriented, No acute distress. HEENT: [place] AT, moist mucus membranes.  Trachea midline, no masses. Cardiovascular: No clubbing, cyanosis, or edema. Respiratory: Normal respiratory effort, no increased work of breathing. GI: Abdomen is soft, nontender, nondistended, no abdominal masses GU: No CVA tenderness.  Lymph: No cervical or inguinal lymphadenopathy. Skin: No rashes, bruises or suspicious lesions. Neurologic: Grossly intact, no focal deficits, moving all 4 extremities. Psychiatric: Normal mood and affect.  Laboratory Data: Lab Results  Component Value Date   WBC 4.8 12/02/2023   HGB 11.4 (L) 12/02/2023   HCT 34.7 (L) 12/02/2023   MCV 91.6 12/02/2023   PLT 164 12/02/2023    Lab Results  Component Value Date   CREATININE 0.87 11/30/2023    No results found for: PSA  No results found for: TESTOSTERONE  Lab Results  Component Value Date   HGBA1C 5.3 03/25/2023    Urinalysis    Component Value Date/Time   COLORURINE AMBER (A) 11/29/2023  1521   APPEARANCEUR CLOUDY (A) 11/29/2023 1521   LABSPEC 1.020 11/29/2023 1521   PHURINE 5.0 11/29/2023 1521   GLUCOSEU NEGATIVE 11/29/2023 1521   HGBUR SMALL (A) 11/29/2023 1521   BILIRUBINUR NEGATIVE 11/29/2023 1521   KETONESUR NEGATIVE 11/29/2023 1521   PROTEINUR 100 (A) 11/29/2023 1521   UROBILINOGEN 1.0 04/29/2010 0405   NITRITE POSITIVE (A) 11/29/2023 1521   LEUKOCYTESUR MODERATE (A) 11/29/2023 1521    Lab Results  Component Value Date   BACTERIA MANY (A) 11/29/2023    Pertinent Imaging: CT 11/29/2023: Images reviewed and discussed with the patient  No results found for this or any previous visit.  No results found for this or any previous visit.  No results found for  this or any previous visit.  No results found for this or any previous visit.  Results for orders placed during the hospital encounter of 05/11/23  US  RENAL  Narrative CLINICAL DATA:  Acute kidney injury. History of a ureteral stricture.  EXAM: RENAL / URINARY TRACT ULTRASOUND COMPLETE  COMPARISON:  Renal ultrasound 01/18/2018. CT 03/25/2023.  FINDINGS: Right Kidney:  Renal measurements: 11.1 x 5.2 x 4.8 cm = volume: 145.2 mL. Echogenicity within normal limits. No mass or hydronephrosis visualized. Prominent column of Bertin  Left Kidney:  Renal measurements: 14.0 x 7.0 x 4.8 cm = volume: 243.9 mL. No perinephric fluid. Extrarenal pelvis identified. Slight distension. Small exophytic cyst measuring 13 mm as seen on prior CT.  Bladder:  Distended bladder with some luminal debris. Lobular contour. Bladder stones on the previous exam are suggested today.  Other:  None.  IMPRESSION: Minimal ectasia of the left renal pelvis. The left hemipelvis is extra renal. No right-sided collecting system dilatation.  Distended urinary bladder with debris. Please correlate with prior CT   Electronically Signed By: Ranell Bring M.D. On: 05/12/2023 19:22  No results found for this or any  previous visit.  No results found for this or any previous visit.  No results found for this or any previous visit.   Assessment & Plan:    1. Urinary retention (Primary) Increase CIC to every 4 hours - Urinalysis, Routine w reflex microscopic  2. Recurrent UTI Likely related to bladder calculi and extended CIC interval  3. Bladder calculi -The risks/benefits/alternatives to cystolithalopaxy was explained to the patient and he understands and wishes to proceed with surgery   No follow-ups on file.  Belvie Clara, MD  [facility] [place]

## 2023-12-08 ENCOUNTER — Encounter: Payer: Self-pay | Admitting: Urology

## 2023-12-08 LAB — URINALYSIS, ROUTINE W REFLEX MICROSCOPIC
Bilirubin, UA: NEGATIVE
Ketones, UA: NEGATIVE
Nitrite, UA: NEGATIVE
Specific Gravity, UA: 1.02 (ref 1.005–1.030)
Urobilinogen, Ur: 1 mg/dL (ref 0.2–1.0)
pH, UA: 6 (ref 5.0–7.5)

## 2023-12-08 LAB — MICROSCOPIC EXAMINATION: WBC, UA: >30 /HPF — AB (ref 0–5)

## 2023-12-08 NOTE — Patient Instructions (Signed)
Bladder Stone  A bladder stone is a buildup of crystals made from the proteins and minerals found in urine. These substances build up when urine becomes too concentrated. Urine is concentrated when there is less water and more proteins and minerals in it. Bladder stones usually develop when a person has another medical condition that prevents the bladder from emptying completely. Crystals can form in the small amount of urine that is left in the bladder. Bladder stones that grow large can become painful and may block the flow of urine. What are the causes? This condition may be caused by: An enlarged prostate, which prevents the bladder from emptying well. An infection of a part of your urinary system (urinary tract infection, or UTI). This includes the: Kidneys. Bladder. Ureters. These are the tubes that carry urine to your bladder. Urethra. This is the tube that drains urine from your bladder. A weak spot in the bladder that creates a small pouch (bladder diverticulum). Nerve damage that may interfere with the signals from your brain to your bladder muscles (neurogenic bladder). This can result from conditions such as Parkinson's disease or spinal cord injuries. What increases the risk? This condition is more likely to develop in people who: Get frequent UTIs. Have another medical condition that affects the bladder. Have a history of bladder surgery. Have a spinal cord injury. Have an abnormal shape of the bladder (deformity). What are the signs or symptoms? Common symptoms of this condition include: Pain in the abdomen. A need to urinate more often. Difficulty or pain when urinating. Blood in the urine. Cloudy urine or urine that is dark in color. Pain in the penis or testicles in men. Small bladder stones do not always cause symptoms. How is this diagnosed? This condition may be diagnosed based on your symptoms, medical history, and physical exam. The physical exam will check for  tenderness in your abdomen. For men, an exam in the rectum may be done to check the prostate gland. You may have tests, such as: A urine test (urinalysis). A urine sample test to check for other infections (culture). Blood tests, including tests to look for a certain substance (creatinine). A creatinine level that is higher than normal could indicate a blockage. A procedure to check your bladder using a scope with a camera (cystoscopy). You may also have imaging studies, such as: CT scan or ultrasound of your abdomen and the area between your hip bones (pelvis or pelvic area). An X-ray of your urinary system. How is this treated? This condition may be treated with: Cystolitholapaxy. This procedure uses a laser, ultrasound, or other device to break the stone into smaller pieces. Fluids are used to flush the small pieces from the area. Surgery to remove the stone. A stent. This is a small mesh tube that is threaded into your ureter to make urine flow. Medicines to treat pain. Follow these instructions at home: Medicines Take over-the-counter and prescription medicines only as told by your health care provider. Ask your health care provider if the medicine prescribed to you: Requires you to avoid driving or using heavy machinery. Can cause constipation. You may need to take these actions to prevent or treat constipation: Take over-the-counter or prescription medicines. Eat foods that are high in fiber, such as beans, whole grains, and fresh fruits and vegetables. Limit foods that are high in fat and processed sugars, such as fried or sweet foods. Alcohol use Do not drink alcohol if: Your health care provider tells you not to  drink. You are pregnant, may be pregnant, or are planning to become pregnant. If you drink alcohol: Limit how much you drink to: 0-1 drink a day for women. 0-2 drinks a day for men. Be aware of how much alcohol is in your drink. In the [place] one drink equals one 12  oz bottle of beer (355 mL), one 5 oz glass of wine (148 mL), or one 1 oz glass of hard liquor (44 mL). Activity Rest as told by your health care provider. Return to your normal activities as told by your health care provider. Ask your health care provider what activities are safe for you. General instructions  Drink enough fluid to keep your urine pale yellow. Tell your health care provider about any unusual symptoms related to urinating. Early diagnosis of an enlarged prostate and other bladder conditions may reduce your risk of getting bladder stones. Do not use any products that contain nicotine or tobacco, such as cigarettes, e-cigarettes, or chewing tobacco. If you need help quitting, ask your health care provider. Do not use drugs. Where to find more information [facility]): www.[facility]rology[facility].org Contact a health care provider if you: Have a fever. Feel nauseous or vomit. Are unable to urinate. Have a large amount of blood in your urine. Get help right away if you: Have severe back pain or pain in the lower part of your abdomen. Cannot eat or drink without vomiting. Vomit after taking your medicine. Summary A bladder stone is a buildup of crystals made from the proteins and minerals found in urine. These substances build up when urine becomes too concentrated. Bladder stones that grow large can become painful and may block the flow of urine. Bladder stones may be treated with a laser, a stent, surgery, or pain medicines. This information is not intended to replace advice given to you by your health care provider. Make sure you discuss any questions you have with your health care provider. Document Revised: 12/02/2021 Document Reviewed: 12/02/2021 Elsevier Patient Education  2024 [employer].

## 2023-12-25 ENCOUNTER — Other Ambulatory Visit

## 2023-12-25 ENCOUNTER — Other Ambulatory Visit: Payer: Self-pay

## 2023-12-25 DIAGNOSIS — J9601 Acute respiratory failure with hypoxia: Secondary | ICD-10-CM

## 2024-01-15 ENCOUNTER — Other Ambulatory Visit: Payer: Self-pay

## 2024-01-15 DIAGNOSIS — Z01818 Encounter for other preprocedural examination: Secondary | ICD-10-CM

## 2024-01-25 ENCOUNTER — Other Ambulatory Visit

## 2024-02-08 ENCOUNTER — Other Ambulatory Visit

## 2024-02-10 ENCOUNTER — Other Ambulatory Visit

## 2024-02-17 ENCOUNTER — Ambulatory Visit: Admitting: Urology

## 2024-03-07 ENCOUNTER — Ambulatory Visit: Admitting: Urology

## 2024-03-16 ENCOUNTER — Ambulatory Visit: Admitting: Internal Medicine

## 2024-03-24 ENCOUNTER — Encounter (HOSPITAL_COMMUNITY): Admission: RE | Payer: Self-pay | Source: Home / Self Care

## 2024-03-24 ENCOUNTER — Ambulatory Visit (HOSPITAL_COMMUNITY): Admission: RE | Admit: 2024-03-24 | Source: Home / Self Care | Admitting: Urology
